# Patient Record
Sex: Female | Born: 1935
Health system: Southern US, Community
[De-identification: ages and names within clinical notes are randomized; demographics above are authoritative.]

## PROBLEM LIST (undated history)

## (undated) DIAGNOSIS — T884XXA Failed or difficult intubation, initial encounter: Secondary | ICD-10-CM

## (undated) DIAGNOSIS — B449 Aspergillosis, unspecified: Secondary | ICD-10-CM

## (undated) DIAGNOSIS — J329 Chronic sinusitis, unspecified: Secondary | ICD-10-CM

## (undated) DIAGNOSIS — J189 Pneumonia, unspecified organism: Secondary | ICD-10-CM

## (undated) DIAGNOSIS — G629 Polyneuropathy, unspecified: Secondary | ICD-10-CM

## (undated) DIAGNOSIS — C801 Malignant (primary) neoplasm, unspecified: Secondary | ICD-10-CM

## (undated) DIAGNOSIS — R32 Unspecified urinary incontinence: Secondary | ICD-10-CM

## (undated) DIAGNOSIS — D689 Coagulation defect, unspecified: Secondary | ICD-10-CM

## (undated) DIAGNOSIS — M25512 Pain in left shoulder: Secondary | ICD-10-CM

## (undated) DIAGNOSIS — M1990 Unspecified osteoarthritis, unspecified site: Secondary | ICD-10-CM

## (undated) DIAGNOSIS — Z889 Allergy status to unspecified drugs, medicaments and biological substances status: Secondary | ICD-10-CM

## (undated) DIAGNOSIS — M3130 Wegener's granulomatosis without renal involvement: Secondary | ICD-10-CM

## (undated) DIAGNOSIS — H9192 Unspecified hearing loss, left ear: Secondary | ICD-10-CM

## (undated) DIAGNOSIS — H5440 Blindness, one eye, unspecified eye: Secondary | ICD-10-CM

## (undated) DIAGNOSIS — J982 Interstitial emphysema: Secondary | ICD-10-CM

## (undated) DIAGNOSIS — IMO0001 Reserved for inherently not codable concepts without codable children: Secondary | ICD-10-CM

## (undated) DIAGNOSIS — J9601 Acute respiratory failure with hypoxia: Secondary | ICD-10-CM

## (undated) DIAGNOSIS — R5383 Other fatigue: Secondary | ICD-10-CM

## (undated) DIAGNOSIS — D649 Anemia, unspecified: Secondary | ICD-10-CM

## (undated) DIAGNOSIS — I639 Cerebral infarction, unspecified: Secondary | ICD-10-CM

## (undated) DIAGNOSIS — Z86711 Personal history of pulmonary embolism: Secondary | ICD-10-CM

## (undated) HISTORY — PX: TUBAL LIGATION: SHX77

## (undated) HISTORY — DX: Coagulation defect, unspecified: D68.9

## (undated) HISTORY — PX: TEAR DUCT PROBING: SHX793

## (undated) HISTORY — PX: PUBOVAGINAL SLING: SHX1035

## (undated) HISTORY — DX: Pain in left shoulder: M25.512

## (undated) HISTORY — DX: Aspergillosis, unspecified: B44.9

## (undated) HISTORY — PX: CATARACT EXTRACTION: SUR2

## (undated) HISTORY — DX: Interstitial emphysema: J98.2

## (undated) HISTORY — PX: OTHER SURGICAL HISTORY: SHX169

## (undated) HISTORY — DX: Acute respiratory failure with hypoxia: J96.01

## (undated) HISTORY — DX: Other fatigue: R53.83

## (undated) HISTORY — PX: MELANOMA EXCISION: SHX5266

---

## 1992-06-23 ENCOUNTER — Encounter: Payer: Self-pay | Admitting: Pulmonary Disease

## 1995-07-04 DIAGNOSIS — Z86711 Personal history of pulmonary embolism: Secondary | ICD-10-CM

## 1995-07-04 HISTORY — DX: Personal history of pulmonary embolism: Z86.711

## 1995-07-04 HISTORY — PX: VENA CAVA FILTER PLACEMENT: SUR1032

## 1996-10-27 ENCOUNTER — Encounter: Payer: Self-pay | Admitting: Pulmonary Disease

## 1998-01-08 ENCOUNTER — Encounter: Payer: Self-pay | Admitting: Pulmonary Disease

## 1998-01-13 ENCOUNTER — Ambulatory Visit: Admission: RE | Admit: 1998-01-13 | Discharge: 1998-01-13 | Payer: Self-pay | Admitting: Pulmonary Disease

## 1998-01-15 ENCOUNTER — Ambulatory Visit (HOSPITAL_BASED_OUTPATIENT_CLINIC_OR_DEPARTMENT_OTHER): Admission: RE | Admit: 1998-01-15 | Discharge: 1998-01-15 | Payer: Self-pay | Admitting: Otolaryngology

## 1998-06-08 ENCOUNTER — Ambulatory Visit: Admission: RE | Admit: 1998-06-08 | Discharge: 1998-06-08 | Payer: Self-pay

## 1999-03-04 ENCOUNTER — Other Ambulatory Visit: Admission: RE | Admit: 1999-03-04 | Discharge: 1999-03-04 | Payer: Self-pay | Admitting: Family Medicine

## 1999-05-06 ENCOUNTER — Encounter: Payer: Self-pay | Admitting: Family Medicine

## 1999-05-06 ENCOUNTER — Encounter: Admission: RE | Admit: 1999-05-06 | Discharge: 1999-05-06 | Payer: Self-pay | Admitting: Family Medicine

## 1999-06-20 ENCOUNTER — Ambulatory Visit: Admission: RE | Admit: 1999-06-20 | Discharge: 1999-06-20 | Payer: Self-pay | Admitting: Unknown Physician Specialty

## 2000-05-10 ENCOUNTER — Encounter: Admission: RE | Admit: 2000-05-10 | Discharge: 2000-05-10 | Payer: Self-pay

## 2000-07-17 ENCOUNTER — Encounter: Admission: RE | Admit: 2000-07-17 | Discharge: 2000-09-17 | Payer: Self-pay | Admitting: Orthopedic Surgery

## 2000-08-14 ENCOUNTER — Encounter: Payer: Self-pay | Admitting: Pulmonary Disease

## 2000-08-17 ENCOUNTER — Ambulatory Visit: Admission: RE | Admit: 2000-08-17 | Discharge: 2000-08-17 | Payer: Self-pay | Admitting: Pulmonary Disease

## 2001-05-14 ENCOUNTER — Encounter: Admission: RE | Admit: 2001-05-14 | Discharge: 2001-05-14 | Payer: Self-pay | Admitting: Family Medicine

## 2001-05-14 ENCOUNTER — Encounter: Payer: Self-pay | Admitting: Family Medicine

## 2001-06-05 ENCOUNTER — Ambulatory Visit (HOSPITAL_COMMUNITY): Admission: RE | Admit: 2001-06-05 | Discharge: 2001-06-05 | Payer: Self-pay | Admitting: Gastroenterology

## 2002-06-17 ENCOUNTER — Encounter: Admission: RE | Admit: 2002-06-17 | Discharge: 2002-06-17 | Payer: Self-pay | Admitting: Family Medicine

## 2002-06-17 ENCOUNTER — Encounter: Payer: Self-pay | Admitting: Family Medicine

## 2002-11-18 ENCOUNTER — Encounter (HOSPITAL_BASED_OUTPATIENT_CLINIC_OR_DEPARTMENT_OTHER): Admission: RE | Admit: 2002-11-18 | Discharge: 2003-02-16 | Payer: Self-pay | Admitting: Internal Medicine

## 2003-02-12 ENCOUNTER — Encounter: Payer: Self-pay | Admitting: Pulmonary Disease

## 2003-06-11 ENCOUNTER — Encounter (HOSPITAL_BASED_OUTPATIENT_CLINIC_OR_DEPARTMENT_OTHER): Admission: RE | Admit: 2003-06-11 | Discharge: 2003-09-09 | Payer: Self-pay | Admitting: Internal Medicine

## 2003-07-14 ENCOUNTER — Encounter: Admission: RE | Admit: 2003-07-14 | Discharge: 2003-07-14 | Payer: Self-pay | Admitting: Family Medicine

## 2003-09-08 ENCOUNTER — Other Ambulatory Visit: Admission: RE | Admit: 2003-09-08 | Discharge: 2003-09-08 | Payer: Self-pay | Admitting: Family Medicine

## 2003-11-13 ENCOUNTER — Emergency Department (HOSPITAL_COMMUNITY): Admission: EM | Admit: 2003-11-13 | Discharge: 2003-11-14 | Payer: Self-pay | Admitting: Emergency Medicine

## 2004-05-17 ENCOUNTER — Encounter (HOSPITAL_BASED_OUTPATIENT_CLINIC_OR_DEPARTMENT_OTHER): Admission: RE | Admit: 2004-05-17 | Discharge: 2004-08-08 | Payer: Self-pay | Admitting: Internal Medicine

## 2004-07-03 HISTORY — PX: KNEE ARTHROSCOPY: SHX127

## 2004-08-22 ENCOUNTER — Encounter (HOSPITAL_BASED_OUTPATIENT_CLINIC_OR_DEPARTMENT_OTHER): Admission: RE | Admit: 2004-08-22 | Discharge: 2004-09-30 | Payer: Self-pay | Admitting: Internal Medicine

## 2004-09-13 ENCOUNTER — Ambulatory Visit (HOSPITAL_COMMUNITY): Admission: RE | Admit: 2004-09-13 | Discharge: 2004-09-13 | Payer: Self-pay | Admitting: Family Medicine

## 2004-09-27 ENCOUNTER — Ambulatory Visit: Payer: Self-pay | Admitting: Pulmonary Disease

## 2004-09-30 ENCOUNTER — Ambulatory Visit (HOSPITAL_COMMUNITY): Admission: RE | Admit: 2004-09-30 | Discharge: 2004-09-30 | Payer: Self-pay | Admitting: Orthopedic Surgery

## 2004-09-30 ENCOUNTER — Ambulatory Visit (HOSPITAL_BASED_OUTPATIENT_CLINIC_OR_DEPARTMENT_OTHER): Admission: RE | Admit: 2004-09-30 | Discharge: 2004-09-30 | Payer: Self-pay | Admitting: Orthopedic Surgery

## 2004-10-07 ENCOUNTER — Emergency Department (HOSPITAL_COMMUNITY): Admission: EM | Admit: 2004-10-07 | Discharge: 2004-10-08 | Payer: Self-pay | Admitting: Emergency Medicine

## 2004-10-11 ENCOUNTER — Ambulatory Visit: Payer: Self-pay | Admitting: Pulmonary Disease

## 2004-10-26 ENCOUNTER — Ambulatory Visit: Payer: Self-pay | Admitting: Pulmonary Disease

## 2004-11-01 ENCOUNTER — Encounter (HOSPITAL_BASED_OUTPATIENT_CLINIC_OR_DEPARTMENT_OTHER): Admission: RE | Admit: 2004-11-01 | Discharge: 2005-01-30 | Payer: Self-pay | Admitting: Surgery

## 2004-11-09 ENCOUNTER — Ambulatory Visit: Payer: Self-pay | Admitting: Pulmonary Disease

## 2004-11-15 ENCOUNTER — Ambulatory Visit: Payer: Self-pay | Admitting: Cardiology

## 2004-12-07 ENCOUNTER — Ambulatory Visit: Payer: Self-pay | Admitting: Pulmonary Disease

## 2004-12-09 ENCOUNTER — Ambulatory Visit: Payer: Self-pay | Admitting: Cardiology

## 2005-01-13 ENCOUNTER — Encounter (INDEPENDENT_AMBULATORY_CARE_PROVIDER_SITE_OTHER): Payer: Self-pay | Admitting: *Deleted

## 2005-01-13 ENCOUNTER — Ambulatory Visit (HOSPITAL_BASED_OUTPATIENT_CLINIC_OR_DEPARTMENT_OTHER): Admission: RE | Admit: 2005-01-13 | Discharge: 2005-01-13 | Payer: Self-pay | Admitting: Otolaryngology

## 2005-01-13 ENCOUNTER — Ambulatory Visit (HOSPITAL_COMMUNITY): Admission: RE | Admit: 2005-01-13 | Discharge: 2005-01-13 | Payer: Self-pay | Admitting: Otolaryngology

## 2005-09-19 ENCOUNTER — Ambulatory Visit (HOSPITAL_COMMUNITY): Admission: RE | Admit: 2005-09-19 | Discharge: 2005-09-19 | Payer: Self-pay | Admitting: Family Medicine

## 2005-10-04 ENCOUNTER — Encounter: Admission: RE | Admit: 2005-10-04 | Discharge: 2005-10-04 | Payer: Self-pay | Admitting: Family Medicine

## 2005-12-08 ENCOUNTER — Encounter (HOSPITAL_BASED_OUTPATIENT_CLINIC_OR_DEPARTMENT_OTHER): Admission: RE | Admit: 2005-12-08 | Discharge: 2005-12-14 | Payer: Self-pay | Admitting: Surgery

## 2005-12-25 ENCOUNTER — Encounter (HOSPITAL_BASED_OUTPATIENT_CLINIC_OR_DEPARTMENT_OTHER): Admission: RE | Admit: 2005-12-25 | Discharge: 2006-01-31 | Payer: Self-pay | Admitting: Surgery

## 2006-03-13 ENCOUNTER — Ambulatory Visit: Payer: Self-pay | Admitting: Pulmonary Disease

## 2006-09-21 ENCOUNTER — Ambulatory Visit (HOSPITAL_COMMUNITY): Admission: RE | Admit: 2006-09-21 | Discharge: 2006-09-21 | Payer: Self-pay | Admitting: Family Medicine

## 2007-02-13 ENCOUNTER — Ambulatory Visit: Payer: Self-pay | Admitting: Pulmonary Disease

## 2007-05-15 ENCOUNTER — Ambulatory Visit: Payer: Self-pay | Admitting: Pulmonary Disease

## 2007-05-15 LAB — CONVERTED CEMR LAB: Neutrophil cytoplasmic antibodies,IgG,serum: <1:20 {titer}

## 2007-05-20 ENCOUNTER — Encounter: Payer: Self-pay | Admitting: Pulmonary Disease

## 2007-05-20 ENCOUNTER — Telehealth: Payer: Self-pay | Admitting: Pulmonary Disease

## 2007-05-21 DIAGNOSIS — J329 Chronic sinusitis, unspecified: Secondary | ICD-10-CM

## 2007-05-21 DIAGNOSIS — R05 Cough: Secondary | ICD-10-CM

## 2007-05-22 DIAGNOSIS — J45909 Unspecified asthma, uncomplicated: Secondary | ICD-10-CM

## 2007-05-22 DIAGNOSIS — M3130 Wegener's granulomatosis without renal involvement: Secondary | ICD-10-CM | POA: Insufficient documentation

## 2007-05-24 ENCOUNTER — Telehealth: Payer: Self-pay | Admitting: Pulmonary Disease

## 2007-07-31 ENCOUNTER — Ambulatory Visit: Payer: Self-pay | Admitting: Pulmonary Disease

## 2007-08-02 ENCOUNTER — Ambulatory Visit: Payer: Self-pay | Admitting: Cardiology

## 2007-10-16 ENCOUNTER — Ambulatory Visit (HOSPITAL_COMMUNITY): Admission: RE | Admit: 2007-10-16 | Discharge: 2007-10-16 | Payer: Self-pay | Admitting: Family Medicine

## 2008-01-31 ENCOUNTER — Other Ambulatory Visit: Admission: RE | Admit: 2008-01-31 | Discharge: 2008-01-31 | Payer: Self-pay | Admitting: Family Medicine

## 2008-04-22 ENCOUNTER — Ambulatory Visit: Payer: Self-pay | Admitting: Pulmonary Disease

## 2008-05-08 ENCOUNTER — Encounter: Payer: Self-pay | Admitting: Pulmonary Disease

## 2008-05-08 ENCOUNTER — Encounter: Admission: RE | Admit: 2008-05-08 | Discharge: 2008-05-08 | Payer: Self-pay | Admitting: Unknown Physician Specialty

## 2008-07-08 ENCOUNTER — Encounter: Payer: Self-pay | Admitting: Pulmonary Disease

## 2008-07-10 ENCOUNTER — Ambulatory Visit: Payer: Self-pay | Admitting: Pulmonary Disease

## 2008-07-10 DIAGNOSIS — J209 Acute bronchitis, unspecified: Secondary | ICD-10-CM

## 2008-07-15 ENCOUNTER — Ambulatory Visit: Payer: Self-pay | Admitting: Cardiology

## 2008-07-30 ENCOUNTER — Ambulatory Visit: Payer: Self-pay | Admitting: Pulmonary Disease

## 2008-08-05 ENCOUNTER — Encounter: Payer: Self-pay | Admitting: Pulmonary Disease

## 2008-08-18 ENCOUNTER — Telehealth (INDEPENDENT_AMBULATORY_CARE_PROVIDER_SITE_OTHER): Payer: Self-pay

## 2008-08-19 ENCOUNTER — Telehealth: Payer: Self-pay | Admitting: Pulmonary Disease

## 2008-10-27 ENCOUNTER — Encounter: Admission: RE | Admit: 2008-10-27 | Discharge: 2008-10-27 | Payer: Self-pay | Admitting: Rheumatology

## 2008-11-02 ENCOUNTER — Ambulatory Visit: Payer: Self-pay | Admitting: Pulmonary Disease

## 2008-11-02 DIAGNOSIS — J159 Unspecified bacterial pneumonia: Secondary | ICD-10-CM | POA: Insufficient documentation

## 2008-11-09 ENCOUNTER — Ambulatory Visit (HOSPITAL_COMMUNITY): Admission: RE | Admit: 2008-11-09 | Discharge: 2008-11-09 | Payer: Self-pay | Admitting: Family Medicine

## 2008-12-09 ENCOUNTER — Ambulatory Visit: Payer: Self-pay | Admitting: Hematology & Oncology

## 2008-12-10 LAB — CBC WITH DIFFERENTIAL (CANCER CENTER ONLY)
BASO#: 0 10*3/uL (ref 0.0–0.2)
Eosinophils Absolute: 0.2 10*3/uL (ref 0.0–0.5)
HCT: 26.6 % — ABNORMAL LOW (ref 34.8–46.6)
HGB: 8.2 g/dL — ABNORMAL LOW (ref 11.6–15.9)
LYMPH#: 0.6 10*3/uL — ABNORMAL LOW (ref 0.9–3.3)
MCH: 22.4 pg — ABNORMAL LOW (ref 26.0–34.0)
NEUT#: 5.9 10*3/uL (ref 1.5–6.5)
NEUT%: 80.9 % — ABNORMAL HIGH (ref 39.6–80.0)
RBC: 3.63 10*6/uL — ABNORMAL LOW (ref 3.70–5.32)

## 2008-12-13 LAB — ERYTHROPOIETIN: Erythropoietin: 168 m[IU]/mL — ABNORMAL HIGH (ref 2.6–34.0)

## 2008-12-13 LAB — COMPREHENSIVE METABOLIC PANEL
AST: 10 U/L (ref 0–37)
Albumin: 3.8 g/dL (ref 3.5–5.2)
BUN: 14 mg/dL (ref 6–23)
Calcium: 9.7 mg/dL (ref 8.4–10.5)
Chloride: 108 mEq/L (ref 96–112)
Potassium: 3.9 mEq/L (ref 3.5–5.3)

## 2008-12-13 LAB — RETICULOCYTES (CHCC): RBC.: 3.62 MIL/uL — ABNORMAL LOW (ref 3.87–5.11)

## 2008-12-13 LAB — VITAMIN D 25 HYDROXY (VIT D DEFICIENCY, FRACTURES): Vit D, 25-Hydroxy: 44 ng/mL (ref 30–89)

## 2009-01-20 ENCOUNTER — Ambulatory Visit: Payer: Self-pay | Admitting: Hematology & Oncology

## 2009-01-22 LAB — CBC WITH DIFFERENTIAL (CANCER CENTER ONLY)
BASO#: 0 10*3/uL (ref 0.0–0.2)
Eosinophils Absolute: 0.1 10*3/uL (ref 0.0–0.5)
HGB: 12 g/dL (ref 11.6–15.9)
LYMPH%: 13.2 % — ABNORMAL LOW (ref 14.0–48.0)
MCH: 29.5 pg (ref 26.0–34.0)
MCHC: 32.1 g/dL (ref 32.0–36.0)
MCV: 92 fL (ref 81–101)
MONO%: 9.6 % (ref 0.0–13.0)
NEUT%: 73.9 % (ref 39.6–80.0)
RBC: 4.07 10*6/uL (ref 3.70–5.32)

## 2009-01-22 LAB — CHCC SATELLITE - SMEAR

## 2009-01-25 LAB — RETICULOCYTES (CHCC)
ABS Retic: 44.6 10*3/uL (ref 19.0–186.0)
RBC.: 4.05 MIL/uL (ref 3.87–5.11)
Retic Ct Pct: 1.1 % (ref 0.4–3.1)

## 2009-01-25 LAB — FERRITIN: Ferritin: 133 ng/mL (ref 10–291)

## 2009-02-01 ENCOUNTER — Ambulatory Visit: Payer: Self-pay | Admitting: Pulmonary Disease

## 2009-03-26 ENCOUNTER — Encounter (INDEPENDENT_AMBULATORY_CARE_PROVIDER_SITE_OTHER): Payer: Self-pay | Admitting: General Surgery

## 2009-03-26 ENCOUNTER — Ambulatory Visit (HOSPITAL_BASED_OUTPATIENT_CLINIC_OR_DEPARTMENT_OTHER): Admission: RE | Admit: 2009-03-26 | Discharge: 2009-03-26 | Payer: Self-pay | Admitting: General Surgery

## 2009-04-05 ENCOUNTER — Telehealth: Payer: Self-pay | Admitting: Pulmonary Disease

## 2009-04-21 ENCOUNTER — Ambulatory Visit: Payer: Self-pay | Admitting: Hematology & Oncology

## 2009-04-23 LAB — CBC WITH DIFFERENTIAL (CANCER CENTER ONLY)
BASO#: 0 10*3/uL (ref 0.0–0.2)
BASO%: 0.6 % (ref 0.0–2.0)
EOS%: 2.8 % (ref 0.0–7.0)
HCT: 38.4 % (ref 34.8–46.6)
HGB: 12.9 g/dL (ref 11.6–15.9)
LYMPH#: 0.6 10*3/uL — ABNORMAL LOW (ref 0.9–3.3)
MCHC: 33.5 g/dL (ref 32.0–36.0)
MONO#: 0.6 10*3/uL (ref 0.1–0.9)
NEUT#: 3.6 10*3/uL (ref 1.5–6.5)
WBC: 4.9 10*3/uL (ref 3.9–10.0)

## 2009-04-23 LAB — RETICULOCYTES (CHCC): RBC.: 3.97 MIL/uL (ref 3.87–5.11)

## 2009-05-18 ENCOUNTER — Encounter: Payer: Self-pay | Admitting: Pulmonary Disease

## 2009-07-15 ENCOUNTER — Ambulatory Visit: Payer: Self-pay | Admitting: Hematology & Oncology

## 2009-07-16 LAB — CBC WITH DIFFERENTIAL (CANCER CENTER ONLY)
BASO#: 0 10*3/uL (ref 0.0–0.2)
Eosinophils Absolute: 0.1 10*3/uL (ref 0.0–0.5)
HGB: 11.4 g/dL — ABNORMAL LOW (ref 11.6–15.9)
LYMPH#: 0.6 10*3/uL — ABNORMAL LOW (ref 0.9–3.3)
MONO#: 0.7 10*3/uL (ref 0.1–0.9)
NEUT#: 4.1 10*3/uL (ref 1.5–6.5)
Platelets: 365 10*3/uL (ref 145–400)
RBC: 3.69 10*6/uL — ABNORMAL LOW (ref 3.70–5.32)
WBC: 5.5 10*3/uL (ref 3.9–10.0)

## 2009-07-16 LAB — FERRITIN: Ferritin: 24 ng/mL (ref 10–291)

## 2009-08-02 ENCOUNTER — Ambulatory Visit: Payer: Self-pay | Admitting: Pulmonary Disease

## 2009-08-16 ENCOUNTER — Encounter: Payer: Self-pay | Admitting: Pulmonary Disease

## 2009-10-05 ENCOUNTER — Ambulatory Visit: Payer: Self-pay | Admitting: Hematology & Oncology

## 2009-10-07 LAB — CBC WITH DIFFERENTIAL (CANCER CENTER ONLY)
BASO%: 0.5 % (ref 0.0–2.0)
EOS%: 1.9 % (ref 0.0–7.0)
HCT: 40.2 % (ref 34.8–46.6)
HGB: 13.1 g/dL (ref 11.6–15.9)
LYMPH#: 0.6 10*3/uL — ABNORMAL LOW (ref 0.9–3.3)
MCHC: 32.5 g/dL (ref 32.0–36.0)
MONO#: 0.7 10*3/uL (ref 0.1–0.9)
NEUT#: 4.7 10*3/uL (ref 1.5–6.5)
RDW: 13.7 % (ref 10.5–14.6)
WBC: 6.2 10*3/uL (ref 3.9–10.0)

## 2009-10-07 LAB — FERRITIN: Ferritin: 61 ng/mL (ref 10–291)

## 2009-10-07 LAB — RETICULOCYTES (CHCC): ABS Retic: 55.7 10*3/uL (ref 19.0–186.0)

## 2009-10-07 LAB — CHCC SATELLITE - SMEAR

## 2009-11-15 ENCOUNTER — Ambulatory Visit (HOSPITAL_COMMUNITY): Admission: RE | Admit: 2009-11-15 | Discharge: 2009-11-15 | Payer: Self-pay | Admitting: Hematology & Oncology

## 2010-01-10 ENCOUNTER — Ambulatory Visit: Payer: Self-pay | Admitting: Hematology & Oncology

## 2010-01-12 LAB — CBC WITH DIFFERENTIAL (CANCER CENTER ONLY)
BASO%: 0.4 % (ref 0.0–2.0)
Eosinophils Absolute: 0.2 10*3/uL (ref 0.0–0.5)
HCT: 39.2 % (ref 34.8–46.6)
LYMPH#: 0.7 10*3/uL — ABNORMAL LOW (ref 0.9–3.3)
LYMPH%: 15.2 % (ref 14.0–48.0)
MCV: 100 fL (ref 81–101)
MONO#: 0.6 10*3/uL (ref 0.1–0.9)
NEUT%: 68.2 % (ref 39.6–80.0)
RBC: 3.93 10*6/uL (ref 3.70–5.32)
RDW: 12.7 % (ref 10.5–14.6)
WBC: 4.6 10*3/uL (ref 3.9–10.0)

## 2010-01-12 LAB — RETICULOCYTES (CHCC)
ABS Retic: 62.9 10*3/uL (ref 19.0–186.0)
Retic Ct Pct: 1.6 % (ref 0.4–3.1)

## 2010-01-12 LAB — FERRITIN: Ferritin: 46 ng/mL (ref 10–291)

## 2010-07-13 ENCOUNTER — Ambulatory Visit: Payer: Self-pay | Admitting: Hematology & Oncology

## 2010-07-14 LAB — CBC WITH DIFFERENTIAL (CANCER CENTER ONLY)
BASO#: 0 10*3/uL (ref 0.0–0.2)
BASO%: 0.6 % (ref 0.0–2.0)
EOS%: 1.9 % (ref 0.0–7.0)
Eosinophils Absolute: 0.1 10*3/uL (ref 0.0–0.5)
HCT: 39 % (ref 34.8–46.6)
HGB: 13 g/dL (ref 11.6–15.9)
LYMPH#: 0.6 10*3/uL — ABNORMAL LOW (ref 0.9–3.3)
LYMPH%: 10.3 % — ABNORMAL LOW (ref 14.0–48.0)
MCH: 33.1 pg (ref 26.0–34.0)
MCHC: 33.5 g/dL (ref 32.0–36.0)
MCV: 99 fL (ref 81–101)
MONO#: 0.9 10*3/uL (ref 0.1–0.9)
MONO%: 14.4 % — ABNORMAL HIGH (ref 0.0–13.0)
NEUT#: 4.3 10*3/uL (ref 1.5–6.5)
NEUT%: 72.8 % (ref 39.6–80.0)
Platelets: 280 10*3/uL (ref 145–400)
RBC: 3.94 10*6/uL (ref 3.70–5.32)
RDW: 13.2 % (ref 10.5–14.6)
WBC: 5.9 10*3/uL (ref 3.9–10.0)

## 2010-07-14 LAB — FERRITIN: Ferritin: 33 ng/mL (ref 10–291)

## 2010-07-14 LAB — RETICULOCYTES (CHCC)
ABS Retic: 51.9 10*3/uL (ref 19.0–186.0)
RBC.: 3.99 MIL/uL (ref 3.87–5.11)
Retic Ct Pct: 1.3 % (ref 0.4–3.1)

## 2010-07-22 ENCOUNTER — Encounter
Admission: RE | Admit: 2010-07-22 | Discharge: 2010-07-22 | Payer: Self-pay | Source: Home / Self Care | Attending: Emergency Medicine | Admitting: Emergency Medicine

## 2010-07-24 ENCOUNTER — Encounter: Payer: Self-pay | Admitting: Family Medicine

## 2010-08-04 NOTE — Letter (Signed)
Summary: [facility]  [facility]   Imported By: Lanelle Bal 09/27/2009 14:20:02  _____________________________________________________________________  External Attachment:    Type:   Image     Comment:   External Document

## 2010-08-04 NOTE — Letter (Signed)
Summary: [facility]  [facility]   Imported By: Sherian Rein 08/18/2009 13:19:44  _____________________________________________________________________  External Attachment:    Type:   Image     Comment:   External Document

## 2010-08-04 NOTE — Assessment & Plan Note (Signed)
Summary: rov for asthma, Wegeners   Copy to:  [facility] Azzie Roup Primary Provider/Referring Provider:  Donovan Kail [facility])  CC:  Pt is here for a 6 month f/u appt.  Pt states she went to [facility] over Thanksgiving and was seen by a Pulmonologist there.  Pt states she had a bronch done and had "air passages dilated."  Pt states breathing has improved.  Pt is currently not on Prednisone.  Pt states occ cough with small amounts of yellow sputum.   Marland Kitchen  History of Present Illness: The pt comes in today for f/u of her multiple pulmonary issues.  She has known  Wegener's which is being followed and treated by rheum, with her last chest ct showing signficant improvement in her cavitary densities.  She also has a h/o bronchial stenosis involving a few secondary/tertiary airways, and also has known airflow obstruction that I believe is at least partially due to asthma.  She tells me that she was recently referred to [facility] where she had what sounds like balloon dilatation of her stenotic areas, but did not have stents placed.  She feels that her breathing is much improved since the procedure last Nov, and currently has minimal cough with scant mucus.  I had initially had her on foradil and not an ICS because she was on chronic oral prednisone.  I had told her to start qvar once she came off prednisone in addition to the foradil.  She apparently was started on advair at [facility], and although will be good therapy, I am worried about upper airway irritation and cough in this patient who has had a chronic cough.  Current Medications (verified): 1)  Calcium 600 Mg  Tabs (Calcium) .... Take 1 Tablet By Mouth Once A Day 2)  Coumadin .... Use As Directed By Coumadin Clinic 3)  Glucosamine Chondroitin .... Take By Mouth Once Daily 4)  Tylenol Pm .... Take 1 Tab By Mouth At Bedtime  and As Needed 5)  Melatonin .... Take 1 Tab By Mouth At Bedtime 6)  Proventil Hfa 108 (90 Base) Mcg/act  Aers  (Albuterol Sulfate) .... 2 Puffs Every 4-6 Hours As Needed 7)  Timoptic ? % Soln (Timolol Maleate) .Marland Kitchen.. 1 Drop in Each Eye Once Daily 8)  Furosemide 20 Mg Tabs (Furosemide) .... As Needed 9)  Klor-Con M20 20 Meq Cr-Tabs (Potassium Chloride Crys Cr) .... As Needed 10)  Folic Acid 1 Mg Tabs (Folic Acid) .... Take 1 Tablet By Mouth Once A Day 11)  Methotrexate .... (Unsure of Dosage) Take 4 Tabs By Mouth Once A Week 12)  Travatan Z 0.004 % Soln (Travoprost) .Marland Kitchen.. 1 Drop in Each Eye At Bedtime 13)  Advair Diskus 500-50 Mcg/dose Aepb (Fluticasone-Salmeterol) .... Inhale 1 Puff Two Times A Day  Allergies (verified): No Known Drug Allergies  Review of Systems      See HPI  Vital Signs:  Patient profile:   77 year old female Height:      62 inches Weight:      139.50 pounds BMI:     25.61 O2 Sat:      96 % on Room air Temp:     97.9 degrees F oral Pulse rate:   86 / minute BP sitting:   130 / 70  (left arm) Cuff size:   regular  Vitals Entered By: Arman Filter LPN (August 02, 2009 10:08 AM)  O2 Flow:  Room air CC: Pt is here for a 6 month  f/u appt.  Pt states she went to [facility] over Thanksgiving and was seen by a Pulmonologist there.  Pt states she had a bronch done and had "air passages dilated."  Pt states breathing has improved.  Pt is currently not on Prednisone.  Pt states occ cough with small amounts of yellow sputum.    Comments Medications reviewed with patient Arman Filter LPN  August 02, 2009 10:10 AM    Physical Exam  General:  wd female in nad Nose:  no drainage noted. Lungs:  large airway wheeze in upper lung zone, + upper airway pseudowheezing as well. Heart:  rrr Extremities:  no cyanosis or edema Neurologic:  alert and oriented, moves all 4.   Impression & Recommendations:  Problem # 1:  WEGENERS GRANULOMATOSIS (ICD-446.4)  the pt feels that she is doing better following bronch with balloon dilatation of her airway stenosis.  It surprises me she has seen  that big of a difference, and has maintained this without stenting (typically balloon dilatation is very short lived unless stent is place for maintenance).  She is maintaining on mtx under the direction of rheum.    Problem # 2:  INTRINSIC ASTHMA, UNSPECIFIED (ICD-493.10) the pt has had no acute bronchospasm on her current regimen.  I have asked her to continue, but if she has a chronic dry cough that persists,  she will need to come off advair.  I assume that she is going to get ongoing pulmonary care at [facility], and there is no reason for duplicate visits.  I will see her again in 6mos, but if she is having ongoing f/u with [facility], there is no reason for her to see me as well.  Medications Added to Medication List This Visit: 1)  Timoptic ? % Soln (timolol Maleate)  .Marland Kitchen.. 1 drop in each eye once daily 2)  Advair Diskus 500-50 Mcg/dose Aepb (Fluticasone-salmeterol) .... Inhale 1 puff two times a day  Other Orders: Est. Patient Level III (62952)  Patient Instructions: 1)  no change in treatment. 2)  followup with me in 6mos.

## 2010-09-01 ENCOUNTER — Other Ambulatory Visit: Payer: Self-pay | Admitting: Dermatology

## 2010-11-16 ENCOUNTER — Other Ambulatory Visit (HOSPITAL_COMMUNITY): Payer: Self-pay | Admitting: Family Medicine

## 2010-11-16 DIAGNOSIS — Z1231 Encounter for screening mammogram for malignant neoplasm of breast: Secondary | ICD-10-CM

## 2010-11-18 NOTE — Op Note (Signed)
[name], MARIO                ACCOUNT NO.:  [id number]   MEDICAL RECORD NO.:  [id number]          PATIENT TYPE:  AMB   LOCATION:  DSC                          FACILITY:  [facility]   PHYSICIAN:  Christopher E. Ezzard Standing, M.D.DATE OF BIRTH:  03-Sep-1932   DATE OF PROCEDURE:  01/13/2005  DATE OF DISCHARGE:                                 OPERATIVE REPORT   PREOPERATIVE DIAGNOSIS:  History of Wegener's granulomatosis, with right-  sided sinus disease, questionable active disease.   POSTOPERATIVE DIAGNOSIS:  History of Wegener's granulomatosis, with right-  sided sinus disease, questionable active disease.   OPERATION:  Nasal endoscopy, with biopsy of right ethmoid, right maxillary  sinus region, to rule out Wegener's granulomatosis.   SURGEON:  Kristine Garbe. Ezzard Standing, M.D.   ANESTHESIA:  General endotracheal.   COMPLICATIONS:  None.   BRIEF CLINICAL NOTE:  [name] is a 77 year old female with the  diagnosis of Wegener's granulomatosis.  She has been treated and has been in  remission, however more recently has had some increased activity, and  question whether she is having increased activity of her Wegener's.  On CT  scan, she had some disease within the right ethmoid, right maxillary sinus,  and Dr. Phylliss Bob requested some biopsy of tissue from this area to rule out  active Wegener's granulomatosis.   DESCRIPTION OF PROCEDURE:  After adequate endotracheal anesthesia, the nose  was prepped with cotton pledgets soaked in Afrin for decongestion.  She had  a large amount of crusting in both nasal cavities which were cleaned in the  office.  The mucosa otherwise looked relatively good after cleaning the  crusting from the nose.  Several biopsies were obtained from the right  ethmoid region and also from the right maxillary ostia area.  Of note, the  mucosa within the maxillary sinus on the right side was very thickened, but  no real ulceration.  It appeared just thickened polypoid  edematous type  mucosa.  Biopsies were obtained from the right maxillary ostia region as  well as the right ethmoid area.  The nose was irrigated with saline.  Cotton  pledgets soaked in Afrin were used for hemostasis, along with suction  cautery.  This completed the procedure.  A small piece of Gelfoam was placed  in the right middle meatus area after obtaining the biopsy.  [name] was  awakened from anesthesia and transferred to the recovery room  postoperatively doing well.   DISPOSITION:  [name] is discharged home later this morning on Tylenol p.r.n.  pain.  I will have her follow up in my office in one week for recheck and  review biopsy.        CEN/MEDQ  D:  01/13/2005  T:  01/13/2005  Job:  [id number]   cc:   Areatha Keas, M.D.  [address]  [address]  [place]  [place] [address]  Fax: [phone]   Kristine Garbe. Ezzard Standing, M.D.  [address]  [place] [address]  Fax: [phone]

## 2010-11-18 NOTE — Assessment & Plan Note (Signed)
[facility]   NAMEDEB, LOUDIN                ACCOUNT NO.:  [id number]   MEDICAL RECORD NO.:  [id number]      DATE OF BIRTH:  1933/02/28   PHYSICIAN:  Jonelle Sports. Sevier, M.D.  VISIT DATE:  01/04/2006                                     OFFICE VISIT   VITAL SIGNS:  Examination today:  Blood pressure 110/60, pulse 58 regular,  respirations 18 and temperature 97.6.   PURPOSE OF TODAY'S VISIT:  HISTORY:  This 77 year old has for a venous  ulceration on the medial supramalleolar aspect of her left lower extremity.  She was last seen June 25 and at that time was placed in an Unna wrap.   WOUND EXAM:  The wound on the left lower extremity in the medial  supramalleolar area now measures 0.8 x 0.8 x 0.05 and has no active  drainage.  There does appear to be a small amount of slough over the surface  of the wound.   WOUND SINCE LAST VISIT:  She reports no problems since that time.  Mainly,  no excessive drainage, no odor, no pain, no fever and no systemic symptoms.   CHANGE IN INTERVAL MEDICAL HISTORY:  She has had no change in medication.   DIAGNOSIS:  Impression is satisfactory course of a venous ulcer, left lower  extremity.   TREATMENT:  The wound is treated with an application of Silverlon and that  extremity is then placed back in an Unna wrap.   TISSUE DEBRIDED:  DISPOSITION:  The wound is full thickness debrided to  remove this slough and a bit of fibrinous material from the center of the  wound as well.  It looks quite healthy and is well granulated.   COMPRESSION BANDAGE:  The patient does have compression stockings at home  available for use and I have suggested to her that although she prefers not  to use them in the summer, and does not use them on the contralateral  extremity, that they are regularly used for at least 6 weeks following  healing of this lesion is strongly recommended.   MANAGEMENT PLAN & GOAL:  Followup visit will be here in 7-8  days.  It is  anticipated the wound will likely be healed by then.           ______________________________  Jonelle Sports. Cheryll Cockayne, M.D.     RES/MEDQ  D:  01/04/2006  T:  01/04/2006  Job:  [id number]

## 2010-11-18 NOTE — Consult Note (Signed)
[facility]  Patient:    [name], [name]                         MRN: [id number] Proc. Date: 07/17/00 Dictator:   Arsenio Loader, M.D. CC:         [name]. Tenny Craw, M.D.   Consultation Report  REFERRING PHYSICIAN:  Charles A. Tenny Craw, M.D. - [facility]  HISTORY OF PRESENT ILLNESS:  The patient is a 77 year old white female with a history of Wagoners granulomatosis with pulmonary and renal upper respiratory tract infections and DD:  07/17/00 TD:  07/17/00 Job: [id number]

## 2010-11-18 NOTE — Consult Note (Signed)
NAME:  [name], [name]                          ACCOUNT NO.:  [id number]   MEDICAL RECORD NO.:  [id number]                   PATIENT TYPE:  REC   LOCATION:  FOOT                                 FACILITY:  [facility]   PHYSICIAN:  Jonelle Sports. Cheryll Cockayne, M.D.              DATE OF BIRTH:  [birth year]   DATE OF CONSULTATION:  11/20/2002  DATE OF DISCHARGE:                                   CONSULTATION   REFERRING PHYSICIAN:  Leonides Grills, M.D.   HISTORY OF PRESENT ILLNESS:  This 77 year old white female is seen on  referral by Dr. Lestine Box for foot care and consideration of custom inserts.   The patient has a complex medical history centered primarily around [facility]  granulomatosis and its related complications to include peripheral  neuropathy.   The neuropathy has resulted in considerable deformity of her feet and also  an insensitivity in the forefoot.  She has tended to calluses in the past,  but has had no active foot ulcerations.  She does have a tendency towards  vasculitic ulcer on the lower extremities, which she usually manages with  antibiotic cream and dry dressings and is able to achieve healing, albeit  over an extended period of time.   In the past, the patient has been evaluated and treated at the [facility] with steroids, Cytoxan, and other regimens, but  currently is on no chronic treatment for her disease.   She is here today for our consideration for ongoing foot care and protective  footwear.   PAST MEDICAL HISTORY:  In addition to the peripheral neuropathy, her  Wegner's has been complicated by bronchial stenosis and emphysema, deep  venous thrombosis with pulmonary embolism, difficulty with hearing,  unilateral blindness, chronic rhinosinusitis, and the lower extremity  ulcers.   ALLERGIES:  She has medicinal allergies.   MEDICATIONS:  Her regular medications include:  1. Trimethoprim sulfa double strength one-half tablet daily.  2. Coumadin on  schedule.  3. Advair Diskus.  4. Rhinocort.  5. Vitamin E.  6. Calcium.  7. Multivitamins.  8. Glucosamine.  9. Tylenol.  10.      Vitamin B complex.  11.      Ranitidine.  12.      Topical estrogen.  13.      Advair Diskus.  14.      Rhinocort nasal spray.  15.      Cefuroxime at times.   PHYSICAL EXAMINATION:  The examination today is limited to the distal lower  extremities.  The patient's feet are characterized by some deformity with  considerable clawing of the toes, particularly of the third, fourth, and  fifth toes bilaterally with the fourth toe tending to extend under the third  on the left foot.  She has no growth edema.  Her skin temperatures are  essentially normal and symmetrical.  Monofilament testing shows that she  lacks protective  sensation in the toes and portions of the forefoot.  Her  pulses are diminished, but palpable.  Capillary filling is reasonable.  She  has multiple scattered calluses on the plantar aspects of her feet, none  with ulceration.  She has numerous hyperpigmented areas on her lower legs  and feet that correspond with areas of healed ulceration.  She has a single  open ulceration measuring 5 x 2 mm, shallow, and dry on her medial right  lower extremity.  She also has a small healing blister on the dorsal aspect  of her first MP joint on the right.   DISPOSITION:  1. The patient is given general instruction regarding foot care by video     with nurse and position reinforcement.  2. Calluses underlying the fifth metatarsal heads bilaterally.  The tip of     the fourth toe and the plantar aspects of the right hallux are all     sharply pared without incident.  3. [name] is held with the patient regarding the desirability of     custom inserts to try to prevent ulceration of these calloused areas     and/or the advance to more significant foot problems.  She is in     agreement and accordingly is given a prescription for custom molded      inserts to be obtained through [employer].  4. Followup visit will be here in several months for routine nail care.                                               Jonelle Sports. Cheryll Cockayne, M.D.    RES/MEDQ  D:  11/20/2002  T:  11/20/2002  Job:  [id number]   cc:   C. Duane Lope, M.D.  [address]  [place]  [place] [address]  Fax: [phone]   Leonides Grills, M.D.  [address]  [place]  [place] [address]  Fax: [phone]

## 2010-11-18 NOTE — Assessment & Plan Note (Signed)
[facility]   NAMEAZIZA, STUCKERT                ACCOUNT NO.:  [id number]   MEDICAL RECORD NO.:  [id number]      DATE OF BIRTH:  1932-09-29   PHYSICIAN:  Theresia Majors. Tanda Rockers, M.D. VISIT DATE:  01/24/2006                                     OFFICE VISIT   VITAL SIGNS:  Her vital signs are stable, she is afebrile.  Blood pressure  is 130/81, respirations 20, pulse rate 70, and she is afebrile.   PURPOSE OF TODAY'S VISIT:  Ms. Axelson returns for followup of left lower  extremity stasis ulceration.   WOUND EXAM:  Inspection of the left lower extremity shows that the wound is  completely resolved.  The chronic changes of stasis are persistent including  2+ edema.   WOUND SINCE LAST VISIT:  In the interim she was treated with compression  hose and a focal piece of felt to provide compression. She reports there has  been an absence of drainage, malodor, pain, or fever.   DIAGNOSIS:  Resolved stasis ulcer.   MANAGEMENT PLAN & GOAL:  We are discharging the patient with a prescription  to procure and wear bilateral below-the-knee 30-40 mm compression hose.  We  have advised her that these hose will have to be inspected and replaced at 3-  month intervals.  This could very well be accomplished by her primary care  physician.  However, if she needs to be reevaluated by the [facility], she  may request her primary care physician to re-refer her to the Wound Center  for reevaluation.  The patient seems to understand.  She expresses gratitude  for having been seen in the clinic and indicates that she will be compliant  with the aforementioned instructions.           ______________________________  Theresia Majors Tanda Rockers, M.D.     [name]  D:  01/25/2006  T:  01/25/2006  Job:  [id number]

## 2010-11-18 NOTE — Consult Note (Signed)
[facility]  Patient:    [name], [name]                       MRN: [id number] Proc. Date: 07/17/00 Adm. Date:  [id number] Attending:  Nadara Mustard CC:         [name]. Tenny Craw, M.D.   Consultation Report  REFERRING PHYSICIAN:  Charles A. Tenny Craw, M.D. - [facility]  HISTORY OF PRESENT ILLNESS:  The patient is a 77 year old white female with a history of Wegeners granulomatosis with pulmonary upper respiratory tract infections and mononeuritis multiplex.  In addition, she has peripheral vascular disease and chronic lower extremity edema.  She presents today complaining of a right lower extremity ulcer that has been persistent x 9 months.  The ulcer was initially traumatic when the patient banged into the refrigerator door.  She has been using Neosporin with Tegaderm dressings, but the ulcer continues to gradually enlarge.  She has baseline lower extremity edema.  She occasionally wears compression hose and takes Lasix p.r.n.  When she does both of these on a regular basis, she notices the lower extremity edema is almost completely resolved.  PAST MEDICAL HISTORY:  Wegeners granulomatosis with upper respiratory tract pulmonary and renal involvement as well as mononeuritis multiplex, a history of bilateral lower extremity DVTs with pulmonary embolus in 1996, peripheral vascular disease, urinary incontinence and osteoarthritis, left eye blindness.  PAST SURGICAL HISTORY:  IVC filter.  ALLERGIES:  Contact dermatitis secondary to TAPE.  MEDICATIONS: 1. Coumadin 2.5 mg q.d. except Fridays, when she takes 5 mg. 2. Zaroxolyn 2.5 mg p.o. q.d. 3. Lasix 80 mg p.o. p.r.n. 4. Bactrim, one tablet p.o. b.i.d. 5. Zantac 150 mg p.o. q.d. 6. Prempro 0.625/2.5 mg p.o. q.d. 7. Timoptic HC 0.5 mg, one drop each eye q.d. 8. Advair multivitamins. 9. Tylenol p.r.n.  PHYSICAL EXAMINATION:  EXTREMITIES:  She has a small, 11 x 12 mm ulceration on the right  lower extremity.  It is shallow based with clean margins.  No periwound erythema or induration.  She has trace to 1+ pitting edema on bilateral lower extremities. Her feet are without ulcerations.  She does have multiple calluses on the lateral aspect of both feet and the left fifth small toe.  DP pulses were trace bilaterally.  ASSESSMENT: 1. Right lower extremity traumatic ulceration. 2. Chronic venous stasis. 3. Peripheral vascular disease. 4. Wegeners granulomatosis.  PLAN: 1. Lasix 80 mg/Zaroxolyn 2.5 mg p.o. q.a.m. - should be on scheduled daily    diuretics. 2. Compression hose to be worn every day. 3. Nonstick dressing with Neosporin to ulceration. 4. Discontinue water aerobics until ulceration has completely healed. 5. Recommend obtaining shoes with wider toe box.  She discussed this with our    ortho tech.  He was able to provide her with recommendations on a good    brand and places to be fitted for more appropriate shoes. 6. Return to the clinic in two weeks.  If the ulceration has not improved    significantly, would recommend ______ dressing at that time. DD:  07/17/00 TD:  07/17/00 Job: [id number]

## 2010-11-18 NOTE — Op Note (Signed)
[name], [name]                ACCOUNT NO.:  [id number]   MEDICAL RECORD NO.:  [id number]          PATIENT TYPE:  AMB   LOCATION:  NESC                         FACILITY:  [facility]   PHYSICIAN:  Georges Lynch. Gioffre, M.D.DATE OF BIRTH:  1932-10-21   DATE OF PROCEDURE:  09/30/2004  DATE OF DISCHARGE:                                 OPERATIVE REPORT   SURGEON:  Georges Lynch. Darrelyn Hillock, M.D.   ASSISTANT:  Nurse.   PREOPERATIVE DIAGNOSIS:  Degenerative arthritis of the right knee.   POSTOPERATIVE DIAGNOSIS:  Degenerative arthritis of the right knee.   OPERATION/PROCEDURE:  1.  Diagnostic arthroscopy, right knee.  2.  Medial meniscectomy, right knee.  3.  Lateral meniscectomy, right knee.  4.  Abrasion and chondroplasty, medial femoral condyle, right knee.   DESCRIPTION OF PROCEDURE:  Under general anesthesia, routine orthopedic  prepping and draping of the right lower extremity is carried out.  She had 1  g of IV Ancef preoperatively.  A small punctate incision was made in the  suprapatellar pouch.  Inflow cannula was inserted.  Knee was distended with  saline.  Other small punctate incisions were made in the anterolateral  joint.  The arthroscope was entered from the lateral approach.  I did a  complete diagnostic arthroscopy.  She had a rather severe arthritis in her  knee.  I did a partial medial meniscectomy with a shaver suction device.  Went over the lateral joint.  Did a partial lateral meniscectomy.  The  lateral meniscus was much worse in regards to the tear of the meniscus.  I  also did abrasive chondroplasty of the medial femoral condyle.  Note she had  tricompartmental arthritis of the suprapatellar pouch and arthritic changes  as well.  Thoroughly irrigated out the knee, removed all the fluid. Closed  all three punctate incisions with a 3-0 nylon suture.  I injected 20 mL of  0.5% Marcaine and epinephrine into the knee joints.  Sterile Neosporin  ____________ dressing were  applied.   FOLLOW UP:  1.  She will be seen in 12-14 days in the office.  2.  She will on a walker and crutches for weightbearing.  3.  She will start back on her Coumadin today.  4.  She will remove all the dressings Saturday at noon and apply a Band-aid.      She may shower then.  5.  I will see her back before two weeks if any further problems.     RAG/MEDQ  D:  09/30/2004  T:  09/30/2004  Job:  [id number]

## 2010-11-18 NOTE — Procedures (Signed)
[place]  Patient:    [name], [name] Visit Number: [id number] MRN: [id number]          Service Type: END Location: ENDO Attending Physician:  Louie Bun Dictated by:   Everardo All Madilyn Fireman, M.D. Proc. Date: 06/05/01 Admit Date:  06/05/2001   CC:         Helene Kelp, M.D.   Procedure Report  PROCEDURE:  Colonoscopy.  INDICATION FOR PROCEDURE:  Screening colonoscopy in a 77 year old patient with no previous colon screening.  DESCRIPTION OF PROCEDURE:  The patient was placed in the left lateral decubitus position and placed on the pulse monitor with continuous low-flow oxygen delivered by nasal cannula.  She was sedated with 40 mg IV Demerol and 7.5 mg IV Versed.  The Olympus video colonoscope was inserted into the rectum and advanced to the cecum, confirmed by transillumination of McBurneys point and visualization of the ileocecal valve and appendiceal orifice.  The prep was excellent.  The cecum, ascending, transverse, and descending colon all appeared normal with no masses, polyps, diverticula, or other mucosal abnormalities.  In the sigmoid colon there were seen a few scattered diverticula and no other abnormalities.  The rectum appeared normal, and a retroflex view of the anus revealed no obviously enlarged internal hemorrhoids.  The colonoscope was then withdrawn and the patient returned to the recovery room in stable condition.  She tolerated the procedure well, and there were no apparent complications.  IMPRESSION:  Left-sided diverticulosis, otherwise normal colonoscopy.  PLAN:  Next colon screening by Hemoccult and flexible sigmoidoscopy in five years. Dictated by:   Everardo All Madilyn Fireman, M.D. Attending Physician:  Louie Bun DD:  06/05/01 TD:  06/05/01 Job: [id number]

## 2010-11-18 NOTE — Assessment & Plan Note (Signed)
[facility]   [name], [name]                ACCOUNT NO.:  [id number]   MEDICAL RECORD NO.:  [id number]      DATE OF BIRTH:  05/19/1933   PHYSICIAN:  Theresia Majors. Tanda Rockers, M.D. VISIT DATE:  01/12/2006                                     OFFICE VISIT   SUBJECTIVE:  Ms. [name] returns for follow-up of a stasis ulcer on the medial  aspect of her left lower extremity.  During the interim, she has worn a  compression wrap.  She reports that there has been decrease in the swelling  and there has been scant drainage.  There has been no malodor and no fever.   OBJECTIVE:  Her vital signs are stable.  She is afebrile.  Inspection of the  lower extremity shows that the chronic changes of stasis are persistent with  2+ edema, hyperpigmentation and some stasis dermatitis.  The ulcer itself  had a halo of nonviable tissue and some frank liquefaction necrosis in the  center. A full thickness was performed with hemorrhage controlled with  direct pressure.  The patient was instructed in the use of a felt pad for  direct pressure and utilizing an Ace compression wrap to provide a complete  compression of the lower extremity.  She has requested a modification so  that she would be able to take a shower and we have agreed to try this  modification to accommodate her.   ASSESSMENT:  Improved stasis ulcer.   PLAN:  Will follow the patient up in two weeks as needed.           ______________________________  Theresia Majors Tanda Rockers, M.D.     [name]  D:  01/12/2006  T:  01/12/2006  Job:  [id number]

## 2010-11-18 NOTE — Assessment & Plan Note (Signed)
[facility]   NAME:  [name], [name]                ACCOUNT NO.:  [id number]   MEDICAL RECORD NO.:  [id number]           DATE OF BIRTH:   PHYSICIAN:  Theresia Majors. Tanda Rockers, M.D. VISIT DATE:  12/25/2005                                     OFFICE VISIT   SUBJECTIVE:  [name] returns for follow-up of a stasis ulcer of her left  lower extremity.  In the interim she has worn an Unna wrap.  She has had an  excursion to the beach.  She did not get the dressing saturated but it did  get moist.  She continued to wear the dressing.  She denies extreme soilage,  fever, or pain.   OBJECTIVE:  Her vital signs are stable.  The wrap has been removed.  There  has been definite healing.  There is near 100% re-epithelialization.  There  is a persistence of 3+ edema.  We have re-applied the [employer].  We will  reevaluate the patient in one week.           ______________________________  Theresia Majors. Tanda Rockers, M.D.     [name]  D:  12/25/2005  T:  12/25/2005  Job:  [id number]

## 2010-11-18 NOTE — Assessment & Plan Note (Signed)
[facility]   [name], [name]                ACCOUNT NO.:  [id number]   MEDICAL RECORD NO.:  [id number]      DATE OF BIRTH:  [birth year]   PHYSICIAN:  Theresia Majors. Tanda Rockers, M.D. VISIT DATE:  12/11/2005                                     OFFICE VISIT   VITAL SIGNS:  Her vital signs are stable, she is afebrile.   PURPOSE OF TODAY'S VISIT:  Ms. [name] returns to the clinic for evaluation of  an ulceration on the left medial lower extremity. According to the patient,  she was entering her upright freezer and a frozen package fell onto her leg  resulting in a skin break and minor bleeding. She self treated herself with  initial first aid and over-the-counter ointment. The wound itself has  remained red, it has enlarged slightly and has become encrusted with a  brownish covering. She denies fever, she does complain of some itching and  moderate pain on ambulation.   WOUND EXAM:  Inspection of the left lower extremity shows that there is 2-3+  edema with chronic changes of stasis including hyperpigmentation and broken  spider angiomata. The pedal pulses are faintly palpable, the ulcer itself is  a macular wound in a circular configuration with a diameter of approximately  1.2 cm. There is a serous drainage but no malodor and a halo of erythema.   WOUND SINCE LAST VISIT:  Not given.   CHANGE IN INTERVAL MEDICAL HISTORY:  Not given.   DIAGNOSIS:  Stasis ulcer.   TREATMENT:  Not given.   ANESTHETIC USED:  Not given.   TISSUE DEBRIDED:  Not given.   LEVEL:  Not given.   CHANGE IN MEDS:  Not given.   COMPRESSION BANDAGE:  Not given.   OTHER:  Not given.   MANAGEMENT PLAN & GOAL:  We would place the patient in an Unna wrap. The  patient is scheduled to go to the beach for two weeks. We have counseled her  regarding keeping the wound dry and the anticipation of some drainage. The  patient has been specifically counseled to avoid prolonged dependency. If  the  wrap gets too tight, she  is to get off of her legs and elevate her legs above the heart. After 15  minutes, if there is no relief, she is to completely remove the wrap and  apply a dressing and an antibiotic ointment and make an appointment to be  seen in the Wound Center upon her return. The patient seems to understand  and will be dubious.           ______________________________  Theresia Majors Tanda Rockers, M.D.     [name]  D:  12/11/2005  T:  12/11/2005  Job:  [id number]

## 2010-11-25 ENCOUNTER — Ambulatory Visit (HOSPITAL_COMMUNITY)
Admission: RE | Admit: 2010-11-25 | Discharge: 2010-11-25 | Disposition: A | Discharge status: Home / Self Care | Payer: Medicare Other | Source: Ambulatory Visit | Attending: Family Medicine | Admitting: Family Medicine

## 2010-11-25 DIAGNOSIS — Z1231 Encounter for screening mammogram for malignant neoplasm of breast: Secondary | ICD-10-CM | POA: Insufficient documentation

## 2010-12-29 ENCOUNTER — Other Ambulatory Visit: Payer: Self-pay | Admitting: Obstetrics and Gynecology

## 2010-12-29 DIAGNOSIS — R918 Other nonspecific abnormal finding of lung field: Secondary | ICD-10-CM

## 2011-01-02 ENCOUNTER — Other Ambulatory Visit: Payer: Self-pay | Admitting: Family Medicine

## 2011-01-02 DIAGNOSIS — R918 Other nonspecific abnormal finding of lung field: Secondary | ICD-10-CM

## 2011-01-03 ENCOUNTER — Ambulatory Visit
Admission: RE | Admit: 2011-01-03 | Discharge: 2011-01-03 | Disposition: A | Discharge status: Home / Self Care | Payer: Medicare Other | Source: Ambulatory Visit | Attending: Obstetrics and Gynecology | Admitting: Obstetrics and Gynecology

## 2011-01-03 DIAGNOSIS — R918 Other nonspecific abnormal finding of lung field: Secondary | ICD-10-CM

## 2011-02-07 ENCOUNTER — Inpatient Hospital Stay (HOSPITAL_COMMUNITY)
Admission: EM | Admit: 2011-02-07 | Discharge: 2011-02-10 | DRG: 378 | Disposition: A | Discharge status: Home / Self Care | Payer: Medicare Other | Attending: Internal Medicine | Admitting: Internal Medicine

## 2011-02-07 ENCOUNTER — Emergency Department (HOSPITAL_COMMUNITY): Payer: Medicare Other

## 2011-02-07 ENCOUNTER — Encounter (HOSPITAL_COMMUNITY): Payer: Self-pay | Admitting: Radiology

## 2011-02-07 DIAGNOSIS — F329 Major depressive disorder, single episode, unspecified: Secondary | ICD-10-CM | POA: Diagnosis present

## 2011-02-07 DIAGNOSIS — B449 Aspergillosis, unspecified: Secondary | ICD-10-CM | POA: Diagnosis present

## 2011-02-07 DIAGNOSIS — K625 Hemorrhage of anus and rectum: Principal | ICD-10-CM | POA: Diagnosis present

## 2011-02-07 DIAGNOSIS — R748 Abnormal levels of other serum enzymes: Secondary | ICD-10-CM | POA: Diagnosis present

## 2011-02-07 DIAGNOSIS — D62 Acute posthemorrhagic anemia: Secondary | ICD-10-CM | POA: Diagnosis present

## 2011-02-07 DIAGNOSIS — H9190 Unspecified hearing loss, unspecified ear: Secondary | ICD-10-CM | POA: Diagnosis present

## 2011-02-07 DIAGNOSIS — K648 Other hemorrhoids: Secondary | ICD-10-CM | POA: Diagnosis present

## 2011-02-07 DIAGNOSIS — F3289 Other specified depressive episodes: Secondary | ICD-10-CM | POA: Diagnosis present

## 2011-02-07 DIAGNOSIS — Z86711 Personal history of pulmonary embolism: Secondary | ICD-10-CM

## 2011-02-07 DIAGNOSIS — T45515A Adverse effect of anticoagulants, initial encounter: Secondary | ICD-10-CM | POA: Diagnosis present

## 2011-02-07 DIAGNOSIS — Z86718 Personal history of other venous thrombosis and embolism: Secondary | ICD-10-CM

## 2011-02-07 DIAGNOSIS — K5730 Diverticulosis of large intestine without perforation or abscess without bleeding: Secondary | ICD-10-CM | POA: Diagnosis present

## 2011-02-07 DIAGNOSIS — M3130 Wegener's granulomatosis without renal involvement: Secondary | ICD-10-CM | POA: Diagnosis present

## 2011-02-07 LAB — COMPREHENSIVE METABOLIC PANEL
BUN: 28 mg/dL — ABNORMAL HIGH (ref 6–23)
CO2: 20 mEq/L (ref 19–32)
Calcium: 8.5 mg/dL (ref 8.4–10.5)
Creatinine, Ser: 0.8 mg/dL (ref 0.50–1.10)
GFR calc Af Amer: >60 mL/min (ref >60)
GFR calc non Af Amer: >60 mL/min (ref >60)
Glucose, Bld: 99 mg/dL (ref 70–99)

## 2011-02-07 LAB — HM COLONOSCOPY

## 2011-02-07 LAB — CBC
HCT: 17.5 % — ABNORMAL LOW (ref 36.0–46.0)
Hemoglobin: 5.7 g/dL — CL (ref 12.0–15.0)
MCH: 31.1 pg (ref 26.0–34.0)
MCHC: 32.6 g/dL (ref 30.0–36.0)
RDW: 16.4 % — ABNORMAL HIGH (ref 11.5–15.5)

## 2011-02-07 LAB — DIFFERENTIAL
Basophils Relative: 1 % (ref 0–1)
Eosinophils Relative: 1 % (ref 0–5)
Lymphocytes Relative: 10 % — ABNORMAL LOW (ref 12–46)
Monocytes Absolute: 0.7 10*3/uL (ref 0.1–1.0)
Monocytes Relative: 10 % (ref 3–12)
Neutro Abs: 5.2 10*3/uL (ref 1.7–7.7)

## 2011-02-07 LAB — URINALYSIS, ROUTINE W REFLEX MICROSCOPIC
Glucose, UA: NEGATIVE mg/dL
Specific Gravity, Urine: 1.022 (ref 1.005–1.030)
pH: 5 (ref 5.0–8.0)

## 2011-02-07 LAB — PROTIME-INR
INR: 1.09 (ref 0.00–1.49)
Prothrombin Time: 14.3 seconds (ref 11.6–15.2)

## 2011-02-07 LAB — POCT I-STAT TROPONIN I: Troponin i, poc: 0.02 ng/mL (ref 0.00–0.08)

## 2011-02-07 LAB — CK TOTAL AND CKMB (NOT AT ARMC)
CK, MB: 2.1 ng/mL (ref 0.3–4.0)
Total CK: 34 U/L (ref 7–177)

## 2011-02-07 LAB — APTT: aPTT: 119 seconds — ABNORMAL HIGH (ref 24–37)

## 2011-02-07 LAB — LIPASE, BLOOD: Lipase: 56 U/L (ref 11–59)

## 2011-02-07 LAB — URINE MICROSCOPIC-ADD ON

## 2011-02-07 LAB — PREPARE RBC (CROSSMATCH)

## 2011-02-07 MED ORDER — IOHEXOL 300 MG/ML  SOLN
100.0000 mL | Freq: Once | INTRAMUSCULAR | Status: AC | PRN
  Administered 2011-02-07: 100 mL via INTRAVENOUS

## 2011-02-07 NOTE — H&P (Signed)
NAMEGERRIE, [name]                ACCOUNT NO.:  [id number]  MEDICAL RECORD NO.:  [id number]  LOCATION:  [facility]LED                         FACILITY:  [facility]  PHYSICIAN:  Talmage Nap, MD  DATE OF BIRTH:  1932/08/05  DATE OF ADMISSION:  02/07/2011 DATE OF DISCHARGE:                             HISTORY & PHYSICAL   PRIMARY CARE PHYSICIAN:  [facility].  History obtainable from the patient and the patient's spouse.  CHIEF COMPLAINT:  Painless rectal bleed for 2 days with dizziness.  HISTORY OF PRESENT ILLNESS:  The patient is a 77 year old Caucasian female with history of DVT/PE, status post IVC filter, Coumadin, and also Wegener granulomatosis disease, presenting to the emergency room with 2 days history of rectal bleed that is painless.  The patient claimed that 2 days prior to presenting to the emergency room, she went to the bathroom and she saw some tinge of blood in her stool.  She denied any tenesmus.  She denied any straining during defecation. However today, she noted that she was passing pure bloody stool.  There was no associated abdominal discomfort.  There is no history of tenesmus.  No chest pain.  No shortness of breath.  The patient claims she was feeling progressively dizzy and lightheaded.  She denied any PND or orthopnea.  She also denied any systemic symptoms.  No fever, chills, or rigor.  The painless rectal bleed with progressive dyspnea was said to be getting progressively worse.  Hence the patient presented to the emergency room to be evaluated.  PAST MEDICAL HISTORY:  Positive for: 1. Wegener granulomatosis disease. 2. History of DVT. 3. Pulmonary embolism. 4. Arthritis. 5. Anemia. 6. Cataract. 7. Depression. 8. Aspergillosis. 9. Visual loss. 10.Chronic sinusitis. 11.Hearing impairment.  PAST SURGICAL HISTORY: 1. Cataract surgery x2. 2. Bronchoscopy x5. 3. DVT/PE, status post IVC filter. 4. Right knee arthroscopic  surgery. 5. Stress incontinence, status post vagina sling complicated by urge     incontinence. 6. Tubal ligation.  PREADMISSION MEDICATIONS: 1. Neti rinse 99% bicarbonate mixture over-the-counter 1 spray daily. 2. Albuterol inhaler 2 puffs q.4 h. p.r.n. 3. Voriconazole 100 mg 1 p.o. b.i.d. 4. Coumadin (warfarin 2.5 mg 1 to 1.5 tablets, she is taking everyday,     but on Tuesday and Saturday, 1 tablet that is 2.5 mg). 5. Timoptic (timolol ophthalmic solution 0.5% in both eyes 1 drop     q.a.m. 6. Multivitamins 1 p.o. daily. 7. Methotrexate 2.5 mg 4 tablets p.o. every Thursday. 8. Melatonin over-the-counter 1 p.o. daily at bedtime. 9. Latanoprost 0.005% ophthalmic 1 drop in both eyes daily at bedtime. 10.Glucosamine HCl 1 tablet p.o. daily. 11.Folic acid 1 mg p.o. daily. 12.Advair Diskus (fluticasone and salmeterol) 500/50 mcg 1 puff b.i.d. 13.Cymbalta (duloxetine) 60 mg 1 p.o. q.a.m. 14.Acetaminophen/diphenhydramine over-the-counter 1 p.o. daily at     bedtime. 15.Vitamin D3, 2000 units p.o. daily, 1 tablet. 16.Calcium/vitamin D over-the-counter 1 tablet p.o. daily.  ALLERGIES:  She has no known allergies.  SOCIAL HISTORY:  Negative for alcohol or tobacco use.  The patient is currently retired, lives at home with her spouse.  FAMILY HISTORY:  York Spaniel to be positive for autoimmune disease.  Daughter  has some Graves disease.  REVIEW OF SYSTEMS:  The patient denies any history of headaches.  No blurred vision.  Complained about lightheadedness and dyspneic spell. Denies any fever.  No chills, no rigor.  No chest pain or shortness of breath.  No cough.  No abdominal discomfort.  Painless hematochezia with no associated abdominal pain.  Denies any dysuria or hematuria.  No intolerance to heat or cold.  No swelling of the lower extremities and no neuropsychiatric disorder.  PHYSICAL EXAMINATION:  GENERAL:  A very pleasant elderly lady, dehydrated, not in any obvious respiratory  distress at present. VITAL SIGNS:  Blood pressure is 100/55, pulse 74, respiratory rate 20, and temperature is 98.5. HEENT:  Pallor. NECK:  No jugular venous distention.  No carotid bruit.  No lymphadenopathy. CHEST:  Clear to auscultation. HEART:  Heart sounds are 1 and 2. ABDOMEN:  Soft and nontender.  Liver, spleen, and kidney not palpable. Bowel sounds are positive. EXTREMITIES:  No pedal edema. NEUROLOGIC:  Nonfocal. MUSCULOSKELETAL SYSTEM:  Show arthritic changes in the knees and in the feet. NEUROPSYCHIATRIC:  Evaluation is unremarkable. SKIN:  Showed decreased turgor.  LABORATORY DATA:  Fecal occult blood test positive.  Coagulation profile showed PT 85, INR greater than 10.01, and APTT 119.  All are elevated. First set of cardiac markers, troponin-I 0.02.  Lipase is 56.  Lactic acid level is 2.0.  Urine microscopy showed urine epithelial cells are rare, urine rbc's 7 to 10 and bacteria is rare and urine microscopy unremarkable.  Chemistry shows sodium of 136, potassium of 3.7, chloride of 108 with a bicarbonate of 20, glucose is 99, BUN is 28, and creatinine 0.80.  LFTs showed total protein 5.2, albumin is 2.8, AST 90, ALT 46, and alkaline phosphatase is 144.  Hematological indices showed WBC count of 6.6, hemoglobin of 5.7, hematocrit of 17.5, MCV of 95.6 with a platelet count of 260, and neutrophils 79% and absolute neutrophil count is 5.2.  Imaging studies done on the patient include acute abdominal series, unremarkable.  CT of the abdomen and pelvis with contrast showed no findings to explain the patient's intermittent diarrhea and tarry stool. Appendix is identified and marked as normal.  Similar appearance of pulmonary nodule, which may be related to the patient's Wegener granulomatosis versus neoplastic process.  EKG showed normal sinus rhythm with a rate of 63.  No acute ST-wave change noted.  ADMITTING IMPRESSION: 1. Painless rectal bleed. 2.  Coagulopathy. 3. Anemia. 4. Dehydration. 5. History of deep venous thrombosis/pulmonary embolism, status post     IVC filter, on Coumadin. 6. Wegener granulomatosis disease. 7. History of aspergillosis. 8. Depression. 9. Cataract with visual impairment. 10.Chronic sinusitis. 11.Hearing impairment. 12.Abnormal LFT.  PLAN:  Plan is to admit the patient to Telemetry.  The patient will be slowly rehydrated with normal saline IV to go at rate of 65 cc an hour. She will be typed and crossed, transfused with 3 units of packed RBCs. She will be given Lasix 40 mg IV after second unit.  She will also be typed and crossed and transfused with 2 units of FFP and she will be given vitamin K 10 mg subcutaneously stat.  Other medication to be given to the patient will include Protonix 40 mg IV q.12 h. for GI prophylaxis and TED stockings for DVT prophylaxis.  The patient will also be on calcium 1 tablet p.o. daily and vitamin D3, 2000 units p.o. b.i.d.  She also be on folic acid 1 mg p.o. daily.  She will be restarted on ophthalmic medication and this include latanoprost as well as timolol maleate.  Further workup to be done on this patient will include repeating CBC, CMP, and magnesium in a.m.  PT, PTT, and INR will be repeated in a.m.  The patient will be followed and evaluated on day-to- day basis.     Talmage Nap, MD     CN/MEDQ  D:  02/07/2011  T:  02/07/2011  Job:  [id number]  Electronically Signed by Talmage Nap  on 02/07/2011 07:38:30 PM

## 2011-02-08 LAB — COMPREHENSIVE METABOLIC PANEL
AST: 44 U/L — ABNORMAL HIGH (ref 0–37)
CO2: 23 mEq/L (ref 19–32)
Calcium: 9.4 mg/dL (ref 8.4–10.5)
Chloride: 111 mEq/L (ref 96–112)
Creatinine, Ser: 0.76 mg/dL (ref 0.50–1.10)
GFR calc Af Amer: >60 mL/min (ref >60)
GFR calc non Af Amer: >60 mL/min (ref >60)
Glucose, Bld: 137 mg/dL — ABNORMAL HIGH (ref 70–99)
Total Bilirubin: 0.4 mg/dL (ref 0.3–1.2)

## 2011-02-08 LAB — CBC
HCT: 27 % — ABNORMAL LOW (ref 36.0–46.0)
Hemoglobin: 9.4 g/dL — ABNORMAL LOW (ref 12.0–15.0)
MCH: 31.6 pg (ref 26.0–34.0)
MCHC: 34.8 g/dL (ref 30.0–36.0)
MCV: 90.9 fL (ref 78.0–100.0)
RDW: 16.3 % — ABNORMAL HIGH (ref 11.5–15.5)

## 2011-02-08 LAB — DIFFERENTIAL
Basophils Absolute: 0 10*3/uL (ref 0.0–0.1)
Lymphs Abs: 0.6 10*3/uL — ABNORMAL LOW (ref 0.7–4.0)
Monocytes Relative: 18 % — ABNORMAL HIGH (ref 3–12)

## 2011-02-08 LAB — URINE CULTURE: Culture: NO GROWTH

## 2011-02-08 LAB — PROTIME-INR
INR: 0.96 (ref 0.00–1.49)
INR: 0.99 (ref 0.00–1.49)

## 2011-02-09 LAB — CBC
HCT: 27.1 % — ABNORMAL LOW (ref 36.0–46.0)
Hemoglobin: 9.2 g/dL — ABNORMAL LOW (ref 12.0–15.0)
MCV: 92.8 fL (ref 78.0–100.0)
RBC: 2.92 MIL/uL — ABNORMAL LOW (ref 3.87–5.11)
WBC: 3.7 10*3/uL — ABNORMAL LOW (ref 4.0–10.5)

## 2011-02-09 LAB — COMPREHENSIVE METABOLIC PANEL
AST: 32 U/L (ref 0–37)
Albumin: 2.4 g/dL — ABNORMAL LOW (ref 3.5–5.2)
BUN: 12 mg/dL (ref 6–23)
Creatinine, Ser: 0.6 mg/dL (ref 0.50–1.10)
Potassium: 3.5 mEq/L (ref 3.5–5.1)
Total Protein: 5 g/dL — ABNORMAL LOW (ref 6.0–8.3)

## 2011-02-09 LAB — PREPARE FRESH FROZEN PLASMA: Unit division: 0

## 2011-02-09 LAB — PROTIME-INR
INR: 1 (ref 0.00–1.49)
Prothrombin Time: 13.4 seconds (ref 11.6–15.2)

## 2011-02-10 LAB — CBC
HCT: 27.8 % — ABNORMAL LOW (ref 36.0–46.0)
Hemoglobin: 9.4 g/dL — ABNORMAL LOW (ref 12.0–15.0)
MCH: 31.9 pg (ref 26.0–34.0)
MCHC: 33.8 g/dL (ref 30.0–36.0)
MCV: 94.2 fL (ref 78.0–100.0)

## 2011-02-11 LAB — TYPE AND SCREEN
Unit division: 0
Unit division: 0
Unit division: 0

## 2011-03-07 NOTE — Discharge Summary (Signed)
NAMEENDIYA, KLAHR                ACCOUNT NO.:  [id number]  MEDICAL RECORD NO.:  [id number]  LOCATION:  1402                         FACILITY:  [facility]  PHYSICIAN:  Calvert Cantor, M.D.     DATE OF BIRTH:  1933/01/11  DATE OF ADMISSION:  02/07/2011 DATE OF DISCHARGE:                        DISCHARGE SUMMARY - REFERRING   PRIMARY CARE PHYSICIAN:  Dr. Duane Lope with [facility].  RHEUMATOLOGIST:  Dr. Dareen Piano with [facility].  Coumadin is followed by [facility]  EXPECTED DATE OF DISPOSITION:  February 10, 2011  DISCHARGE DIAGNOSES: 1. Rectal bleeding in setting of supratherapeutic INR, now resolved. 2. Acute blood loss anemia related to rectal bleeding, status post 3     units of packed red blood cells this admission. 3. Coagulopathy in a setting of chronic Coumadin use. 4. History of deep venous thrombosis and pulmonary embolism, status     post IVC filter remotely. 5. History of Wegener granulomatosis disease. 6. Aspergillosis. 7. Depression. 8. Cataracts. 9. Hearing impairment.  CONSULTATIONS DURING HOSPITALIZATION:  [facility], Everardo All. Madilyn Fireman, M.D.  PROCEDURES DURING HOSPITALIZATION: 1. Acute abdominal series performed on February 07, 2011, showing no     active process with nonobstructive bowel gas pattern. 2. CT of the abdomen and pelvis obtained on February 07, 2011, with no     acute intraabdominal findings.  CT does show a stable appearance of     pulmonary nodules.  BRIEF HISTORY OF PRESENT ILLNESS:  Ms. [name] is a 77 year old female with past medical history of PE and DVT, status post IVC filter in 1996, also with history of Wegener granulomatosis and chronic Coumadin use. The patient presented to the emergency department on day of admission with complaints of rectal bleeding for approximately 2 days with associated dizziness.  The patient described bleeding as bright red in nature with no  associated pain.  She denied any shortness of breath. Again, she did report some dizziness and lightheadedness.  Upon evaluation in the emergency department, the patient found to have a hemoglobin of 5.7 in addition to an INR of greater than 10.01.  The patient was admitted at that time by [facility] hospitalist for further evaluation and treatment.  COURSE OF HOSPITALIZATION: 1. Rectal bleeding.  The patient's bleeding felt related to     coagulopathy in setting of supratherapeutic INR of greater than     10.01.  The patient's INR was reversed at the time of admission     with both vitamin K as well as FFP.  After the patient received     both vitamin K and 2 units of fresh frozen plasma. The patient's     bleeding resolved with resolution of supratherapeutic INR.  GI was     asked to see the patient in consultation.  The patient did undergo     a colonoscopy on February 10, 2011, results show internal hemorrhoids     as well as few diverticula; however, no active bleeding was noted.     The patient cleared for discharge by Gastroenterology. 2. Acute blood loss anemia related to rectal bleeding.  The patient     did receive 3  units of packed red blood cells during     hospitalization.  Her hemoglobin has gone from 5.7 and has remained     stable at 9.4 with no recurrent bleeding.  No further transfusions     felt necessary at this time. 3. Coagulopathy in setting of chronic Coumadin use.  The patient's     elevated INR is felt related to medication effect as the patient is     on methotrexate as well as antifungal medication for her     aspergillosis.  Prior to this admission, the patient was being     followed monthly in the Coumadin Clinic at [facility].  She will need closer monitoring for now of her INR.     At this time, her INRs drifted down to 1.0 and with no recurrent     bleeding.  The patient is felt stable to resume Coumadin at lower     dose and follow up  with Coumadin Clinic as listed below.  MEDICATIONS AT DISCHARGE: 1. Coumadin 2.5 mg p.o. daily unless otherwise directed by Coumadin     Clinic. 2. Tylenol PM 1 tablet p.o. q.h.s. p.r.n. insomnia. 3. Advair Diskus 500/50 mcg 1 puff inhaled b.i.d. 4. Albuterol inhaler 2 puffs inhaled q.4 h. p.r.n. shortness of     breath. 5. Calcium plus vitamin D over-the-counter tablet 1 tablet p.o. daily. 6. Cymbalta 60 mg p.o. daily. 7. Folic acid 1 mg p.o. b.i.d. 8. Glucosamine 1 tablet p.o. b.i.d. 9. Latanoprost 0.005% 1 drop both eyes q.h.s. 10.Melatonin over-the-counter p.o. q.h.s. 11.Methotrexate 2.5 mg tablet 4 tablets p.o. every Thursday. 12.Multivitamins p.o. daily. 13.Timolol ophthalmic solution 0.5% 1 drop both eyes daily. 14.Vitamin D 2000 units p.o. daily. 15.Voriconazole 200 mg p.o. b.i.d.  PERTINENT LABORATORY FINDINGS DURING HOSPITALIZATION:  Discharge CBC shows white cell count of 4.2, platelet count 235, hemoglobin 9.4, hematocrit 27.8.  Sodium 141, potassium 3.5, BUN 12, creatinine 0.60. Liver function tests within normal limits.  INR on February 09, 2011, 1.00 down from greater than 10.01 on February 07, 2011.  DISPOSITION:  The patient is felt medically stable for discharge home at this time.  Again, the patient is instructed to follow closely with Coumadin Clinic.  She is scheduled to be seen in the Coumadin Clinic at [facility] on Monday, August 13th, at 11 a.m.  In addition, the patient is asked to follow up with her primary care physician, Dr. Duane Lope in 2 weeks' time.  The patient is to continue regular diet as tolerated.  Approximately 40 minutes spent on discharge planning.     Cordelia Pen, NP   ______________________________ Calvert Cantor, M.D.    LE/MEDQ  D:  02/10/2011  T:  02/10/2011  Job:  [id number]  cc:   Magnus Sinning) Tenny Craw, M.D. Fax: [phone]  Dr. [facility]  Dr. [facility]  Electronically Signed by Cordelia Pen NP on 03/03/2011 02:47:24 PM Electronically Signed by Calvert Cantor M.D. on 03/07/2011 02:22:15 PM

## 2011-03-15 NOTE — Op Note (Signed)
  NAMERACHELL, DRUCKENMILLER                ACCOUNT NO.:  [id number]  MEDICAL RECORD NO.:  [id number]  LOCATION:  1402                         FACILITY:  [facility]  PHYSICIAN:  Graylin Shiver, M.D.   DATE OF BIRTH:  11/28/1932  DATE OF PROCEDURE:  02/10/2011 DATE OF DISCHARGE:  02/10/2011                              OPERATIVE REPORT   INDICATIONS FOR PROCEDURE:  Rectal bleeding, anemia.  Informed consent was obtained after explanation of the risks of bleeding, infection, and perforation.  PREMEDICATION:  Fentanyl 50 mcg IV, Versed 4 mg IV.  PROCEDURE:  With the patient in the left lateral decubitus position, a rectal exam was performed and no masses were felt.  The Pentax colonoscope was inserted into the rectum and advanced around the colon to the cecum.  Cecal landmarks were identified.  The cecum, ascending colon looked normal.  The transverse colon looked normal.  The descending colon and sigmoid revealed a few diverticula.  The rectum looked normal.  There were some internal hemorrhoids.  There was no evidence of active bleeding or blood seen on this examination.  She tolerated the procedure well without complications.  IMPRESSION: 1. Diverticulosis. 2. Internal hemorrhoids.  There is no evidence of active bleeding now.  I think it is reasonable to discharge the patient from a GI standpoint.          ______________________________ Graylin Shiver, M.D.     SFG/MEDQ  D:  02/10/2011  T:  02/11/2011  Job:  [id number]  Electronically Signed by Herbert Moors MD on 03/15/2011 08:52:34 AM

## 2011-03-26 NOTE — Consult Note (Signed)
  [name], [name]                ACCOUNT NO.:  [id number]  MEDICAL RECORD NO.:  [id number]  LOCATION:  1402                         FACILITY:  [facility]  PHYSICIAN:  Jordanne Elsbury C. Madilyn Fireman, M.D.    DATE OF BIRTH:  1933/05/10  DATE OF CONSULTATION: DATE OF DISCHARGE:                                CONSULTATION   REASON FOR CONSULTATION:  GI bleeding.  HISTORY OF ILLNESS:  The patient is a 77 year old white female with history of Wegener granulomatosis as well as remote DVT with pulmonary embolism, status post IVC filter many years ago, and continued on Coumadin who presented with painless hematochezia, orthostasis with a hemoglobin of 5.7 and an INR of 10.  She received 3 units of packed red blood cells as well as fresh frozen plasma with normalization of her INR and rise in hemoglobin to 9.4.  She had a last colonoscopy in 2002 showing left-sided diverticulosis.  She has noticed some maroon smear with her bowel movements today, but otherwise significant decrease in the rectal output.  PAST MEDICAL HISTORY: 1. Wegener granulomatosis. 2. History of DVT. 3. History of pulmonary emboli. 4. History of aspergillosis 5. Hearing impairment.  SURGERIES: 1. Multiple bronchoscopies. 2. IVC filter. 3. Right knee arthroscopy.  ADMISSION MEDICATIONS:  Coumadin, albuterol, methotrexate, melatonin, Advair, Cymbalta, acetaminophen.  SOCIAL HISTORY:  The patient denies alcohol or tobacco use.  PHYSICAL EXAMINATION:  GENERAL:  Well-developed, well-nourished white female in no acute stress. HEART:  Regular rate and rhythm without murmurs. LUNGS:  Clear. ABDOMEN:  Soft, nondistended with normoactive bowel sounds.  No hepatosplenomegaly, mass, or guarding.  IMPRESSION:  Lower gastrointestinal bleed, exacerbated by over- anticoagulation with Coumadin.  PLAN:  Since the patient has had adequate resuscitation, we will plan colonoscopy.  Because of her regular diet today, she will be  prepped tomorrow for colonoscopy, which will be scheduled for the day after tomorrow.          ______________________________ Everardo All Madilyn Fireman, M.D.     J[facility]/MEDQ  D:  02/08/2011  T:  02/08/2011  Job:  [id number]  Electronically Signed by Dorena Cookey M.D. on 03/26/2011 11:39:21 AM

## 2011-05-16 DIAGNOSIS — Z9889 Other specified postprocedural states: Secondary | ICD-10-CM | POA: Insufficient documentation

## 2011-05-16 DIAGNOSIS — I2699 Other pulmonary embolism without acute cor pulmonale: Secondary | ICD-10-CM | POA: Insufficient documentation

## 2011-05-16 DIAGNOSIS — N393 Stress incontinence (female) (male): Secondary | ICD-10-CM | POA: Insufficient documentation

## 2011-05-16 DIAGNOSIS — B449 Aspergillosis, unspecified: Secondary | ICD-10-CM | POA: Insufficient documentation

## 2011-05-16 DIAGNOSIS — Z8719 Personal history of other diseases of the digestive system: Secondary | ICD-10-CM | POA: Insufficient documentation

## 2011-05-16 DIAGNOSIS — Z8619 Personal history of other infectious and parasitic diseases: Secondary | ICD-10-CM | POA: Insufficient documentation

## 2011-05-16 DIAGNOSIS — G5793 Unspecified mononeuropathy of bilateral lower limbs: Secondary | ICD-10-CM | POA: Insufficient documentation

## 2011-05-16 DIAGNOSIS — M159 Polyosteoarthritis, unspecified: Secondary | ICD-10-CM | POA: Insufficient documentation

## 2011-05-16 DIAGNOSIS — H5462 Unqualified visual loss, left eye, normal vision right eye: Secondary | ICD-10-CM | POA: Insufficient documentation

## 2011-05-16 DIAGNOSIS — D039 Melanoma in situ, unspecified: Secondary | ICD-10-CM | POA: Insufficient documentation

## 2011-06-14 DIAGNOSIS — H04549 Stenosis of unspecified lacrimal canaliculi: Secondary | ICD-10-CM | POA: Insufficient documentation

## 2011-06-28 ENCOUNTER — Other Ambulatory Visit: Payer: Self-pay

## 2011-06-28 ENCOUNTER — Encounter (HOSPITAL_COMMUNITY): Payer: Self-pay | Admitting: Emergency Medicine

## 2011-06-28 ENCOUNTER — Emergency Department (HOSPITAL_COMMUNITY): Payer: Medicare Other

## 2011-06-28 ENCOUNTER — Emergency Department (HOSPITAL_COMMUNITY)
Admission: EM | Admit: 2011-06-28 | Discharge: 2011-06-28 | Disposition: A | Discharge status: Home / Self Care | Payer: Medicare Other | Attending: Emergency Medicine | Admitting: Emergency Medicine

## 2011-06-28 DIAGNOSIS — Z79899 Other long term (current) drug therapy: Secondary | ICD-10-CM | POA: Insufficient documentation

## 2011-06-28 DIAGNOSIS — M19019 Primary osteoarthritis, unspecified shoulder: Secondary | ICD-10-CM | POA: Insufficient documentation

## 2011-06-28 DIAGNOSIS — M25512 Pain in left shoulder: Secondary | ICD-10-CM

## 2011-06-28 DIAGNOSIS — M25519 Pain in unspecified shoulder: Secondary | ICD-10-CM | POA: Insufficient documentation

## 2011-06-28 HISTORY — DX: Unspecified urinary incontinence: R32

## 2011-06-28 HISTORY — DX: Blindness, one eye, unspecified eye: H54.40

## 2011-06-28 HISTORY — DX: Unspecified osteoarthritis, unspecified site: M19.90

## 2011-06-28 HISTORY — DX: Wegener's granulomatosis without renal involvement: M31.30

## 2011-06-28 HISTORY — DX: Unspecified hearing loss, left ear: H91.92

## 2011-06-28 MED ORDER — HYDROCODONE-ACETAMINOPHEN 5-325 MG PO TABS: 1.0000 | ORAL_TABLET | ORAL | Status: AC | PRN

## 2011-06-28 NOTE — ED Notes (Signed)
Patient is resting comfortably. 

## 2011-06-28 NOTE — ED Notes (Signed)
Vital signs stable. 

## 2011-06-28 NOTE — ED Notes (Signed)
ZOX:WR60<AV> Expected date:06/28/11<BR> Expected time: 5:22 AM<BR> Means of arrival:Ambulance<BR> Comments:<BR> EMS

## 2011-06-28 NOTE — ED Notes (Signed)
Family at bedside. 

## 2011-06-28 NOTE — ED Provider Notes (Signed)
History     CSN: [id number]  Arrival date & time 06/28/11  1191   First MD Initiated Contact with Patient 06/28/11 0630      Chief Complaint  Patient presents with  . Shoulder Pain    Shoulder pain.  She had fallen Sunday  and not seen.  Pt states pain tingles down left arm.    (Consider location/radiation/quality/duration/timing/severity/associated sxs/prior treatment) Patient is a 77 y.o. female presenting with shoulder pain. The history is provided by the patient.  Shoulder Pain  She did having pain in her left shoulder last night. She had fallen 2 days earlier but did not think she had injured anything. Pain in the left shoulder is dull and worse with any movement. Pain is moderately severe and she rates it 7/10. She had taken Tramdol with slight, temporary relief. His with his shoulder.  Past Medical History  Diagnosis Date  . Wegener's granulomatosis   . Fungal infection of lung   . OA (osteoarthritis)   . Blindness of one eye   . Hearing loss in left ear   . Bladder incontinence   . Glaucoma     History reviewed. No pertinent past surgical history.  No family history on file.  History  Substance Use Topics  . Smoking status: Not on file  . Smokeless tobacco: Not on file  . Alcohol Use:     OB History    Grav Para Term Preterm Abortions TAB SAB Ect Mult Living                  Review of Systems  All other systems reviewed and are negative.    Allergies  Review of patient's allergies indicates no known allergies.  Home Medications   Current Outpatient Rx  Name Route Sig Dispense Refill  . DIPHENHYDRAMINE-APAP (SLEEP) 25-500 MG PO TABS Oral Take 1 tablet by mouth at bedtime as needed.      . DULOXETINE HCL 60 MG PO CPEP Oral Take 60 mg by mouth daily.      Marland Kitchen FLUTICASONE-SALMETEROL 500-50 MCG/DOSE IN AEPB Inhalation Inhale 1 puff into the lungs every 12 (twelve) hours.      Marland Kitchen FOLIC ACID 1 MG PO TABS Oral Take 1 mg by mouth daily.      . FUROSEMIDE  80 MG PO TABS Oral Take 80 mg by mouth daily as needed. For excess fluid redux     . METHOTREXATE 2.5 MG PO TABS Oral Take 2.5 mg by mouth 4 (four) times a week. Caution:Chemotherapy. Protect from light.     Marland Kitchen POTASSIUM CHLORIDE CRYS CR 20 MEQ PO TBCR Oral Take 20 mEq by mouth as needed. With lasix     . VORICONAZOLE 200 MG PO TABS Oral Take 200 mg by mouth 2 (two) times daily.        BP 147/63  Pulse 97  Temp(Src) 99.2 F (37.3 C) (Oral)  Resp 16  SpO2 98%  Physical Exam  Nursing note and vitals reviewed.  77 year old female is resting comfortably and in no acute distress. Vital signs are normal. Oxygen saturation is 90% which is normal. Extraocular movements are full. Head is normocephalic and atraumatic. Neck is supple without adenopathy or JVD. Back is nontender. Lungs are clear without rales, wheezes, rhonchi. Heart has regular rate and rhythm without murmur. Abdomen is soft, flat, nontender without masses or hepatosplenomegaly. Extremities: There is moderate tenderness to palpation in the left shoulder rather diffusely and there is moderate to severe pain with  passive range of motion of left shoulder. There is no swelling or deformity noted. Range of motion is intact. No other extremity injuries seen. Skin is warm and moist without rash. Neurologic: Mental status is normal, cranial nerves are intact, there no focal motor or sensory deficits.  ED Course  Procedures (including critical care time)  Labs Reviewed - No data to display No results found.   No diagnosis found.  ECG shows normal sinus rhythm with a rate of 91, no ectopy. Normal axis. Normal P wave. Normal QRS. Normal intervals. Normal ST and T waves. Impression: normal ECG.No significant change when compared with ECG of 02/07/2011.  MDM  Shoulder pain, rule out fracture        Dione Booze, MD 06/28/11 [phone]

## 2011-07-12 ENCOUNTER — Other Ambulatory Visit (HOSPITAL_COMMUNITY): Payer: Self-pay | Admitting: Rheumatology

## 2011-07-12 DIAGNOSIS — IMO0002 Reserved for concepts with insufficient information to code with codable children: Secondary | ICD-10-CM

## 2011-07-17 ENCOUNTER — Ambulatory Visit: Payer: Medicare Other | Admitting: Physical Therapy

## 2011-07-20 ENCOUNTER — Encounter (HOSPITAL_COMMUNITY)
Admission: RE | Admit: 2011-07-20 | Discharge: 2011-07-20 | Disposition: A | Discharge status: Home / Self Care | Payer: Medicare Other | Source: Ambulatory Visit | Attending: Rheumatology | Admitting: Rheumatology

## 2011-07-20 ENCOUNTER — Encounter (HOSPITAL_COMMUNITY): Payer: Self-pay

## 2011-07-20 DIAGNOSIS — M549 Dorsalgia, unspecified: Secondary | ICD-10-CM | POA: Insufficient documentation

## 2011-07-20 DIAGNOSIS — Z85820 Personal history of malignant melanoma of skin: Secondary | ICD-10-CM | POA: Insufficient documentation

## 2011-07-20 DIAGNOSIS — Z9181 History of falling: Secondary | ICD-10-CM | POA: Insufficient documentation

## 2011-07-20 DIAGNOSIS — IMO0002 Reserved for concepts with insufficient information to code with codable children: Secondary | ICD-10-CM

## 2011-07-20 DIAGNOSIS — M889 Osteitis deformans of unspecified bone: Secondary | ICD-10-CM | POA: Insufficient documentation

## 2011-07-20 MED ORDER — TECHNETIUM TC 99M MEDRONATE IV KIT
23.4000 | PACK | Freq: Once | INTRAVENOUS | Status: AC | PRN
  Administered 2011-07-20: 23.4 via INTRAVENOUS

## 2011-08-02 ENCOUNTER — Other Ambulatory Visit: Payer: Self-pay | Admitting: Rheumatology

## 2011-08-02 DIAGNOSIS — M25512 Pain in left shoulder: Secondary | ICD-10-CM

## 2011-08-09 ENCOUNTER — Ambulatory Visit
Admission: RE | Admit: 2011-08-09 | Discharge: 2011-08-09 | Disposition: A | Discharge status: Home / Self Care | Payer: Medicare Other | Source: Ambulatory Visit | Attending: Rheumatology | Admitting: Rheumatology

## 2011-08-09 DIAGNOSIS — M25512 Pain in left shoulder: Secondary | ICD-10-CM

## 2011-10-25 ENCOUNTER — Other Ambulatory Visit (HOSPITAL_COMMUNITY): Payer: Self-pay | Admitting: Family Medicine

## 2011-10-25 DIAGNOSIS — Z1231 Encounter for screening mammogram for malignant neoplasm of breast: Secondary | ICD-10-CM

## 2011-11-28 ENCOUNTER — Ambulatory Visit (HOSPITAL_COMMUNITY)
Admission: RE | Admit: 2011-11-28 | Discharge: 2011-11-28 | Disposition: A | Discharge status: Home / Self Care | Payer: Medicare Other | Source: Ambulatory Visit | Attending: Family Medicine | Admitting: Family Medicine

## 2011-11-28 DIAGNOSIS — Z1231 Encounter for screening mammogram for malignant neoplasm of breast: Secondary | ICD-10-CM | POA: Insufficient documentation

## 2011-12-14 ENCOUNTER — Ambulatory Visit
Admission: RE | Admit: 2011-12-14 | Discharge: 2011-12-14 | Disposition: A | Discharge status: Home / Self Care | Payer: Medicare Other | Source: Ambulatory Visit | Attending: Neurology | Admitting: Neurology

## 2011-12-14 ENCOUNTER — Other Ambulatory Visit: Payer: Self-pay | Admitting: Neurology

## 2011-12-14 DIAGNOSIS — G609 Hereditary and idiopathic neuropathy, unspecified: Secondary | ICD-10-CM

## 2011-12-14 DIAGNOSIS — M3130 Wegener's granulomatosis without renal involvement: Secondary | ICD-10-CM

## 2011-12-14 DIAGNOSIS — R269 Unspecified abnormalities of gait and mobility: Secondary | ICD-10-CM

## 2012-01-11 ENCOUNTER — Other Ambulatory Visit: Payer: Self-pay | Admitting: Neurology

## 2012-01-11 DIAGNOSIS — R269 Unspecified abnormalities of gait and mobility: Secondary | ICD-10-CM

## 2012-01-11 DIAGNOSIS — M3130 Wegener's granulomatosis without renal involvement: Secondary | ICD-10-CM

## 2012-01-11 DIAGNOSIS — G629 Polyneuropathy, unspecified: Secondary | ICD-10-CM

## 2012-01-16 ENCOUNTER — Ambulatory Visit
Admission: RE | Admit: 2012-01-16 | Discharge: 2012-01-16 | Disposition: A | Discharge status: Home / Self Care | Payer: Medicare Other | Source: Ambulatory Visit | Attending: Neurology | Admitting: Neurology

## 2012-01-16 DIAGNOSIS — M3130 Wegener's granulomatosis without renal involvement: Secondary | ICD-10-CM

## 2012-01-16 DIAGNOSIS — R269 Unspecified abnormalities of gait and mobility: Secondary | ICD-10-CM

## 2012-01-16 DIAGNOSIS — G629 Polyneuropathy, unspecified: Secondary | ICD-10-CM

## 2012-01-22 ENCOUNTER — Other Ambulatory Visit: Payer: Self-pay | Admitting: Neurology

## 2012-01-22 DIAGNOSIS — M3130 Wegener's granulomatosis without renal involvement: Secondary | ICD-10-CM

## 2012-01-22 DIAGNOSIS — G629 Polyneuropathy, unspecified: Secondary | ICD-10-CM

## 2012-01-22 DIAGNOSIS — R269 Unspecified abnormalities of gait and mobility: Secondary | ICD-10-CM

## 2012-07-18 ENCOUNTER — Telehealth: Payer: Self-pay | Admitting: Pulmonary Disease

## 2012-07-18 NOTE — Telephone Encounter (Signed)
I spoke with pt. She stated it was not an emergency to get her in. She is scheduled to see [facility] 08/07/12 at 10:45. Nothing further was needed. Pt aware of our location.

## 2012-08-07 ENCOUNTER — Encounter: Payer: Self-pay | Admitting: Pulmonary Disease

## 2012-08-07 ENCOUNTER — Ambulatory Visit (INDEPENDENT_AMBULATORY_CARE_PROVIDER_SITE_OTHER): Payer: Medicare Other | Admitting: Pulmonary Disease

## 2012-08-07 VITALS — BP 134/76 | HR 75 | Temp 97.7°F | Ht 62.0 in | Wt 123.2 lb

## 2012-08-07 DIAGNOSIS — J45909 Unspecified asthma, uncomplicated: Secondary | ICD-10-CM

## 2012-08-07 DIAGNOSIS — M3130 Wegener's granulomatosis without renal involvement: Secondary | ICD-10-CM

## 2012-08-07 NOTE — Assessment & Plan Note (Signed)
The patient has been followed by rheumatology and also pulmonary at [facility].  As far as the patient knows, pulmonary disease has been well controlled.  I will try and get the records from [facility] for my review.

## 2012-08-07 NOTE — Assessment & Plan Note (Signed)
Patient feels that she is doing very well from an asthma standpoint on her current medications.  She has not had a recent acute exacerbation, nor has she required a recent prednisone pulse.  I've asked her to continue on her current regimen, and try and stay as active as possible.  She will followup with me in 6 months if doing well.  I will try and get her pulmonary records from [facility] for further review.

## 2012-08-07 NOTE — Patient Instructions (Addendum)
Will get your pulmonary records from [facility] to review, and will call if any questions or concerns. No change in your asthma meds. Try and stay as active as possible. followup with me in 6mos if doing well.

## 2012-08-07 NOTE — Progress Notes (Signed)
  Subjective:    Patient ID: [name], female    DOB: April 20, 1933, 80 y.o.   MRN: [id number]  HPI The patient is a 79 year old female who I've been asked to see for management of asthma.  She has been a patient of mine in the distant past, and has not been seen in 3 years.  She has a history of asthma, as well as Wegener's granulomatosis.  She is being followed closely by rheumatology, and treated with methotrexate and Bactrim.  She has a history of mild bronchial stenosis of the upper lobes at bronchoscopy, and this is felt to be related to her Wegener's.  She has had balloon dilatation at [facility] for this, but not stenting.  She tells me that her pulmonary component to her Wegener's has been adequately controlled, but I will need to get the records from [facility].  She states her asthma has been doing very well, with no recent acute exacerbation.  She has chronic dyspnea on exertion, but she feels this is at her usual baseline.   Review of Systems  Constitutional: Positive for unexpected weight change. Negative for fever.  HENT: Positive for congestion, rhinorrhea, postnasal drip and sinus pressure. Negative for ear pain, nosebleeds, sore throat, sneezing, trouble swallowing and dental problem.   Eyes: Negative for redness and itching.  Respiratory: Positive for cough ( prod). Negative for chest tightness, shortness of breath and wheezing.   Cardiovascular: Negative for palpitations and leg swelling.  Gastrointestinal: Negative for nausea and vomiting.       Indigestion  Genitourinary: Negative for dysuria.  Musculoskeletal: Negative for joint swelling.  Skin: Negative for rash.  Neurological: Negative for headaches.  Hematological: Does not bruise/bleed easily.  Psychiatric/Behavioral: Negative for dysphoric mood. The patient is not nervous/anxious.        Objective:   Physical Exam Thin female in no acute distress Nose without purulence or discharge noted Neck without  lymphadenopathy or thyromegaly Chest with a few scattered crackles, excellent airflow, no wheezing Cardiac exam with regular rate and rhythm Lower extremities without edema, varicosities are present.  No cyanosis Alert and oriented, moves all 4 extremities.       Assessment & Plan:

## 2012-09-12 ENCOUNTER — Other Ambulatory Visit: Payer: Self-pay | Admitting: Dermatology

## 2012-10-14 DIAGNOSIS — H04203 Unspecified epiphora, bilateral lacrimal glands: Secondary | ICD-10-CM | POA: Insufficient documentation

## 2012-12-09 ENCOUNTER — Other Ambulatory Visit (HOSPITAL_COMMUNITY): Payer: Self-pay | Admitting: Family Medicine

## 2012-12-09 DIAGNOSIS — Z1231 Encounter for screening mammogram for malignant neoplasm of breast: Secondary | ICD-10-CM

## 2012-12-11 ENCOUNTER — Ambulatory Visit (HOSPITAL_COMMUNITY)
Admission: RE | Admit: 2012-12-11 | Discharge: 2012-12-11 | Disposition: A | Discharge status: Home / Self Care | Payer: Medicare Other | Source: Ambulatory Visit | Attending: Family Medicine | Admitting: Family Medicine

## 2012-12-11 DIAGNOSIS — Z1231 Encounter for screening mammogram for malignant neoplasm of breast: Secondary | ICD-10-CM | POA: Insufficient documentation

## 2012-12-18 ENCOUNTER — Other Ambulatory Visit: Payer: Self-pay | Admitting: Dermatology

## 2012-12-19 ENCOUNTER — Telehealth: Payer: Self-pay | Admitting: Pulmonary Disease

## 2012-12-19 MED ORDER — PREDNISONE 20 MG PO TABS: 20.0000 mg | ORAL_TABLET | Freq: Every day | ORAL | Status: DC

## 2012-12-19 NOTE — Telephone Encounter (Signed)
Suggest prednisone 20 mg, 1 daily x 5 days then stop. # 5, no refill

## 2012-12-19 NOTE — Telephone Encounter (Signed)
Pt calls today with c/o discomfort in chest w/deep breaths and increased night sweats. Pt states that she has had her coumadin checked which was WNL and her Dr advised her to f/u with Pulmonary Clance pt. Last OV 08/07/2012 H/o mild bronchial stenosis of the upper lobes; felt to be related to her Wegener's. Pt has recently followed for this @ [facility].  Pt refused to schedule appt with MW that was offered to her until she hears back from [facility] if she NEEDS an appt today.  Please advise Dr Maple Hudson if pt needs appt today or if there are any other recs to offer. Thanks.

## 2012-12-19 NOTE — Telephone Encounter (Signed)
Pt aware of recs. rx called in. Nothing further was needed 

## 2013-02-04 ENCOUNTER — Ambulatory Visit: Payer: Medicare Other | Admitting: Pulmonary Disease

## 2013-02-11 ENCOUNTER — Ambulatory Visit (INDEPENDENT_AMBULATORY_CARE_PROVIDER_SITE_OTHER): Payer: Medicare Other | Admitting: Pulmonary Disease

## 2013-02-11 ENCOUNTER — Ambulatory Visit (INDEPENDENT_AMBULATORY_CARE_PROVIDER_SITE_OTHER)
Admission: RE | Admit: 2013-02-11 | Discharge: 2013-02-11 | Disposition: A | Discharge status: Home / Self Care | Payer: Medicare Other | Source: Ambulatory Visit | Attending: Pulmonary Disease | Admitting: Pulmonary Disease

## 2013-02-11 ENCOUNTER — Encounter: Payer: Self-pay | Admitting: Pulmonary Disease

## 2013-02-11 VITALS — BP 98/62 | HR 67 | Temp 97.8°F | Ht 61.0 in | Wt 127.6 lb

## 2013-02-11 DIAGNOSIS — M3130 Wegener's granulomatosis without renal involvement: Secondary | ICD-10-CM

## 2013-02-11 DIAGNOSIS — J4520 Mild intermittent asthma, uncomplicated: Secondary | ICD-10-CM

## 2013-02-11 DIAGNOSIS — J45909 Unspecified asthma, uncomplicated: Secondary | ICD-10-CM

## 2013-02-11 MED ORDER — FLUTICASONE-SALMETEROL 500-50 MCG/DOSE IN AEPB: 1.0000 | INHALATION_SPRAY | Freq: Two times a day (BID) | RESPIRATORY_TRACT | Status: DC

## 2013-02-11 NOTE — Assessment & Plan Note (Signed)
The patient appears to be doing well from an asthma standpoint, and has not had any acute flares or increased rescue inhaler use.  I have asked her to continue her current bronchodilator regimen.

## 2013-02-11 NOTE — Patient Instructions (Addendum)
Will check chest xray today, and call you with results. No change in medications. Will check breathing tests at next visit, which will be in 6mos.

## 2013-02-11 NOTE — Progress Notes (Signed)
  Subjective:    Patient ID: [name], female    DOB: 1932-09-06, 79 y.o.   MRN: [id number]  HPI Patient comes in today for followup of her known asthma, as well as Wegner's granulomatosis.  She is maintaining on methotrexate and Bactrim, and is being followed by rheumatology.  She feels that she is stable from a pulmonary standpoint, with no change in her chronic dyspnea on exertion.  He denies any significant cough or mucus production.  She is continuing on her maintenance bronchodilator regimen.   Review of Systems  Constitutional: Negative for fever and unexpected weight change.  HENT: Negative for ear pain, nosebleeds, congestion, sore throat, rhinorrhea, sneezing, trouble swallowing, dental problem, postnasal drip and sinus pressure.   Eyes: Negative for redness and itching.  Respiratory: Positive for shortness of breath. Negative for cough, chest tightness and wheezing.   Cardiovascular: Negative for palpitations and leg swelling.  Gastrointestinal: Negative for nausea and vomiting.  Genitourinary: Negative for dysuria.  Musculoskeletal: Negative for joint swelling.  Skin: Negative for rash.  Neurological: Negative for headaches.  Hematological: Does not bruise/bleed easily.  Psychiatric/Behavioral: Negative for dysphoric mood. The patient is not nervous/anxious.        Objective:   Physical Exam Frail-appearing female in no acute distress Nose without purulent discharge noted Neck without lymphadenopathy or thyromegaly Chest with a few rhonchi, no crackles or wheezing Cardiac exam regular rate and rhythm Lower extremities without edema, no cyanosis Alert and oriented, moves all 4 extremities.       Assessment & Plan:

## 2013-02-11 NOTE — Assessment & Plan Note (Signed)
The patient appears to be stable from this standpoint, and continues to followup with rheumatology.  She has not had breathing studies in a while, and we'll schedule this for her next visit.

## 2013-02-13 ENCOUNTER — Telehealth: Payer: Self-pay | Admitting: *Deleted

## 2013-02-14 ENCOUNTER — Other Ambulatory Visit: Payer: Self-pay | Admitting: Pulmonary Disease

## 2013-02-14 ENCOUNTER — Telehealth: Payer: Self-pay | Admitting: Pulmonary Disease

## 2013-02-14 DIAGNOSIS — M3130 Wegener's granulomatosis without renal involvement: Secondary | ICD-10-CM

## 2013-02-14 NOTE — Telephone Encounter (Signed)
Spoke with patient-- informed her results as listed below Patient agreed to have CT chest done Informed her that order would be placed and someone from our office would contact her for scheduling Will forward to Dr. Shelle Iron for orders

## 2013-02-14 NOTE — Telephone Encounter (Signed)
Order sent.

## 2013-02-14 NOTE — Telephone Encounter (Signed)
ATC patient x3 1) patients husband answered phone and phone line disonnected 2) line busy 3) rang once then to voicemail [facility] ---------- Notes Recorded by Barbaraann Share, MD on 02/11/2013 at 7:02 PM Please let pt know that her cxr does show a new abnormal area in right upper lobe compared to her prior film. The radiologist has recommended a ct chest for further characterization. This may be related to wegeners or some type of scarring. See if she is willing to have this.

## 2013-02-14 NOTE — Telephone Encounter (Signed)
Patient returning call.

## 2013-02-14 NOTE — Progress Notes (Signed)
Quick Note:  Spoke with patient-- informed her results as listed below Patient agreed to have CT chest done Informed her that order would be placed and someone from our office would contact her for scheduling ______

## 2013-02-14 NOTE — Progress Notes (Signed)
Quick Note:  ATC patient x3 1) patients husband answered phone and phone line disonnected 2) line busy 3) rang once then to voicemail [id number]   ______

## 2013-02-21 ENCOUNTER — Ambulatory Visit (INDEPENDENT_AMBULATORY_CARE_PROVIDER_SITE_OTHER)
Admission: RE | Admit: 2013-02-21 | Discharge: 2013-02-21 | Disposition: A | Discharge status: Home / Self Care | Payer: Medicare Other | Source: Ambulatory Visit | Attending: Pulmonary Disease | Admitting: Pulmonary Disease

## 2013-02-21 DIAGNOSIS — M3130 Wegener's granulomatosis without renal involvement: Secondary | ICD-10-CM

## 2013-02-24 ENCOUNTER — Telehealth: Payer: Self-pay | Admitting: Pulmonary Disease

## 2013-02-24 NOTE — Telephone Encounter (Signed)
Called pt 8/22, 8/25 and LMOM.  Asked her to call me with number where I can reach her, and will review ct with her.

## 2013-02-24 NOTE — Telephone Encounter (Signed)
I do not see where anyone called pt. She did have CT chest 02/21/13. Please advise KC thanks

## 2013-02-25 NOTE — Telephone Encounter (Signed)
Pt returning call can be reached any time after 11:30 at [phone].[name]

## 2013-02-26 NOTE — Telephone Encounter (Signed)
Pt returning call for CT results and will be at [phone] until 2:00 today.

## 2013-02-26 NOTE — Telephone Encounter (Signed)
Please advise Dr. Shelle Iron thanks

## 2013-02-26 NOTE — Telephone Encounter (Signed)
Patient is now home and can be reached at home again.  [phone]  Patient states she is anxious about CT results.

## 2013-02-27 ENCOUNTER — Telehealth: Payer: Self-pay | Admitting: Pulmonary Disease

## 2013-02-27 NOTE — Telephone Encounter (Signed)
Error/dup message ° °

## 2013-02-27 NOTE — Telephone Encounter (Signed)
Returning call.

## 2013-02-27 NOTE — Telephone Encounter (Signed)
Discussed with pt the ct results.  Most c/w an inflammatory focus, probably related to [facility]. Radiologist has recommended a f/u in 2 mos,  But pt at low risk for cancer and this area is very similar to the finding on the left which has turned into a small residual scar.  After discussing with pt, she would like to keep her apptm in about 5mos, and can discuss radiographic f/u at that time.

## 2013-03-25 NOTE — Telephone Encounter (Signed)
error 

## 2013-03-31 ENCOUNTER — Other Ambulatory Visit: Payer: Self-pay | Admitting: Dermatology

## 2013-08-11 DIAGNOSIS — H401190 Primary open-angle glaucoma, unspecified eye, stage unspecified: Secondary | ICD-10-CM | POA: Insufficient documentation

## 2013-08-15 ENCOUNTER — Ambulatory Visit: Payer: Medicare Other | Admitting: Pulmonary Disease

## 2013-08-15 ENCOUNTER — Ambulatory Visit (INDEPENDENT_AMBULATORY_CARE_PROVIDER_SITE_OTHER): Payer: Medicare Other | Admitting: Pulmonary Disease

## 2013-08-15 DIAGNOSIS — M3130 Wegener's granulomatosis without renal involvement: Secondary | ICD-10-CM

## 2013-08-15 DIAGNOSIS — J4520 Mild intermittent asthma, uncomplicated: Secondary | ICD-10-CM

## 2013-08-15 LAB — PULMONARY FUNCTION TEST
DL/VA % PRED: 88 %
DL/VA: 3.89 ml/min/mmHg/L
DLCO UNC % PRED: 68 %
DLCO UNC: 13.95 ml/min/mmHg
FEF 25-75 Post: 0.96 L/sec
FEF 25-75 Pre: 1.07 L/sec
FEF2575-%Change-Post: -10 %
FEF2575-%Pred-Post: 74 %
FEF2575-%Pred-Pre: 82 %
FEV1-%Change-Post: -1 %
FEV1-%PRED-POST: 95 %
FEV1-%Pred-Pre: 97 %
FEV1-POST: 1.63 L
FEV1-Pre: 1.66 L
FEV1FVC-%CHANGE-POST: 3 %
FEV1FVC-%Pred-Pre: 95 %
FEV6-%CHANGE-POST: -3 %
FEV6-%PRED-PRE: 105 %
FEV6-%Pred-Post: 102 %
FEV6-PRE: 2.3 L
FEV6-Post: 2.22 L
FEV6FVC-%Change-Post: 1 %
FEV6FVC-%PRED-POST: 106 %
FEV6FVC-%PRED-PRE: 104 %
FVC-%Change-Post: -5 %
FVC-%PRED-POST: 96 %
FVC-%Pred-Pre: 101 %
FVC-PRE: 2.34 L
FVC-Post: 2.22 L
POST FEV6/FVC RATIO: 100 %
PRE FEV6/FVC RATIO: 98 %
Post FEV1/FVC ratio: 73 %
Pre FEV1/FVC ratio: 71 %

## 2013-08-15 NOTE — Progress Notes (Signed)
PFT done today. 

## 2013-08-29 ENCOUNTER — Ambulatory Visit: Payer: Medicare Other | Admitting: Pulmonary Disease

## 2013-09-19 ENCOUNTER — Ambulatory Visit (INDEPENDENT_AMBULATORY_CARE_PROVIDER_SITE_OTHER): Payer: Medicare Other | Admitting: Pulmonary Disease

## 2013-09-19 ENCOUNTER — Encounter: Payer: Self-pay | Admitting: Pulmonary Disease

## 2013-09-19 VITALS — BP 118/80 | HR 63 | Temp 97.3°F | Ht 61.0 in | Wt 126.2 lb

## 2013-09-19 DIAGNOSIS — J45909 Unspecified asthma, uncomplicated: Secondary | ICD-10-CM

## 2013-09-19 DIAGNOSIS — M3130 Wegener's granulomatosis without renal involvement: Secondary | ICD-10-CM

## 2013-09-19 NOTE — Assessment & Plan Note (Signed)
The patient is due for a followup CT chest for the abnormal area in the right upper lobe it appears to be inflammatory.  Her PFTs today actually look very well, with essentially normal total lung capacity and a mild reduction in diffusion capacity that has changed very little over the years.

## 2013-09-19 NOTE — Assessment & Plan Note (Signed)
Patient feels that her breathing is stable on her current medication. Her PFTs showed no worsening of her airflows, and in fact there is very little airflow obstruction seen.

## 2013-09-19 NOTE — Progress Notes (Signed)
   Subjective:    Patient ID: [name], female    DOB: 26-Mar-1933, 80 y.o.   MRN: [id number]  HPI The patient comes in today for followup of her known asthma, as well as Wegener's granulomatosis. Overall, she is doing very well from a breathing standpoint, and feels that she is at her usual baseline. She is staying on her bronchodilator regimen, and has not had any flareup since last visit. She has some cough but is related to her known chronic sinusitis, but does not feel congested in her chest. She did have pulmonary function studies recently that showed no airflow obstruction, essentially normal total lung capacity, and only a mild reduction in DLCO. These were either very similar or improved from many years ago. She is due for a followup scan since her CT chest in August of last year showed an abnormal density in the right upper lobe that was felt to be inflammatory and probably related to her Wegener's.   Review of Systems  Constitutional: Negative for fever and unexpected weight change.  HENT: Negative for congestion, dental problem, ear pain, nosebleeds, postnasal drip, rhinorrhea, sinus pressure, sneezing, sore throat and trouble swallowing.   Eyes: Negative for redness and itching.  Respiratory: Negative for cough, chest tightness, shortness of breath and wheezing.   Cardiovascular: Negative for palpitations and leg swelling.  Gastrointestinal: Negative for nausea and vomiting.  Genitourinary: Negative for dysuria.  Musculoskeletal: Negative for joint swelling.  Skin: Negative for rash.  Neurological: Negative for headaches.  Hematological: Does not bruise/bleed easily.  Psychiatric/Behavioral: Negative for dysphoric mood. The patient is not nervous/anxious.        Objective:   Physical Exam Frail-appearing female in no acute distress Nose without purulence or discharge noted Neck without lymphadenopathy or thyromegaly Chest with mildly decreased breath sounds, no wheezes or  crackles Cardiac exam with regular rate and rhythm Lower extremities with mild ankle edema, no cyanosis Alert and oriented, moves all 4 extremities.       Assessment & Plan:

## 2013-09-19 NOTE — Patient Instructions (Signed)
Will do followup scan of chest to re-evaluate the area seen in august last year. No change in asthma medications Stay as active as possible. followup with me again in 95mos, but will call you with ct results.

## 2013-09-24 ENCOUNTER — Ambulatory Visit (INDEPENDENT_AMBULATORY_CARE_PROVIDER_SITE_OTHER)
Admission: RE | Admit: 2013-09-24 | Discharge: 2013-09-24 | Disposition: A | Discharge status: Home / Self Care | Payer: Medicare Other | Source: Ambulatory Visit | Attending: Pulmonary Disease | Admitting: Pulmonary Disease

## 2013-09-24 DIAGNOSIS — M3130 Wegener's granulomatosis without renal involvement: Secondary | ICD-10-CM

## 2013-09-25 ENCOUNTER — Other Ambulatory Visit: Payer: Medicare Other

## 2013-09-29 ENCOUNTER — Telehealth: Payer: Self-pay | Admitting: Pulmonary Disease

## 2013-09-29 NOTE — Telephone Encounter (Signed)
Notes Recorded by Virl Cagey, CMA on 09/25/2013 at 6:04 PM LMOM x 1 ------  Notes Recorded by Kathee Delton, MD on 09/25/2013 at 9:20 AM Let pt know that her abnormal area in top of right lung is significantly improved, with only scarring left. Great news. The spots in the left lung that have been present since 2012 are stable from scan last year and decreased from 2012. These are felt to be due to her Wegener's. Pt advised. [place] Bing, CMA

## 2013-10-02 ENCOUNTER — Other Ambulatory Visit: Payer: Self-pay | Admitting: Pulmonary Disease

## 2013-10-16 ENCOUNTER — Encounter (INDEPENDENT_AMBULATORY_CARE_PROVIDER_SITE_OTHER): Payer: Self-pay | Admitting: Surgery

## 2013-11-05 ENCOUNTER — Encounter (INDEPENDENT_AMBULATORY_CARE_PROVIDER_SITE_OTHER): Payer: Self-pay | Admitting: Surgery

## 2013-11-05 ENCOUNTER — Ambulatory Visit (INDEPENDENT_AMBULATORY_CARE_PROVIDER_SITE_OTHER): Payer: Medicare Other | Admitting: Surgery

## 2013-11-05 VITALS — BP 130/75 | HR 98 | Temp 96.8°F | Resp 16 | Ht 61.5 in | Wt 126.4 lb

## 2013-11-05 DIAGNOSIS — K4090 Unilateral inguinal hernia, without obstruction or gangrene, not specified as recurrent: Secondary | ICD-10-CM

## 2013-11-05 NOTE — Progress Notes (Signed)
Patient ID: [name], female   DOB: 04-19-1933, 80 y.o.   MRN: [id number]  Chief Complaint  Patient presents with  . Inguinal Hernia    HPI [name] is a 80 y.o. female.   HPI This is a very pleasant female referred by Dr. Harrington Challenger for evaluation of a left inguinal hernia. She noticed a small bulge in the left groin several weeks ago. She has no pain and reports that it easily reduces. She has no obstructive symptoms. Past Medical History  Diagnosis Date  . Wegener's granulomatosis   . OA (osteoarthritis)   . Blindness of one eye   . Hearing loss in left ear   . Bladder incontinence   . Glaucoma   . Clotting disorder     Past Surgical History  Procedure Laterality Date  . Tubal ligation    . Pubovaginal sling    . Cataract extraction    . Melanoma excision      left leg  . Vena cava filter placement      Family History  Problem Relation Age of Onset  . Heart disease Father     stroke-deceased  . Stroke Father   . Congestive Heart Failure Mother     deceased  . Heart disease Mother   . Uterine cancer Sister   . Lymphoma Sister   . Skin cancer Sister     no melanoma    Social History History  Substance Use Topics  . Smoking status: Former Smoker -- 1.50 packs/day for 30 years    Types: Cigarettes    Quit date: 07/03/1984  . Smokeless tobacco: Not on file  . Alcohol Use: Yes     Comment: 1-2 glasses    Allergies  Allergen Reactions  . Adhesive [Tape]     Current Outpatient Prescriptions  Medication Sig Dispense Refill  . ADVAIR DISKUS 500-50 MCG/DOSE AEPB INHALE 1 PUFF INTO LUNGS EVERY 12 HOURS  60 each  4  . CIPRODEX otic suspension       . diphenhydramine-acetaminophen (TYLENOL PM) 25-500 MG TABS Take 1 tablet by mouth at bedtime as needed.        . folic acid (FOLVITE) 1 MG tablet Take 1 mg by mouth daily.        Marland Kitchen latanoprost (XALATAN) 0.005 % ophthalmic solution       . methotrexate (RHEUMATREX) 2.5 MG tablet Take 10 mg by mouth once a week.  Caution:Chemotherapy. Protect from light.      . Multiple Vitamins-Minerals (MULTIVITAMIN WITH MINERALS) tablet Take 1 tablet by mouth daily.      Marland Kitchen sulfamethoxazole-trimethoprim (BACTRIM DS) 800-160 MG per tablet Take 1 tablet by mouth 3 (three) times a week.      . warfarin (COUMADIN) 2.5 MG tablet Take 2.5 mg by mouth as directed.      . zoledronic acid (RECLAST) 5 MG/100ML SOLN Inject 5 mg into the vein once. yearly       No current facility-administered medications for this visit.    Review of Systems Review of Systems  Constitutional: Negative for fever, chills and unexpected weight change.  HENT: Negative for congestion, hearing loss, sore throat, trouble swallowing and voice change.   Eyes: Negative for visual disturbance.  Respiratory: Positive for wheezing. Negative for cough.   Cardiovascular: Negative for chest pain, palpitations and leg swelling.  Gastrointestinal: Negative for nausea, vomiting, abdominal pain, diarrhea, constipation, blood in stool, abdominal distention and anal bleeding.  Genitourinary: Positive for frequency. Negative for hematuria, vaginal  bleeding and difficulty urinating.  Musculoskeletal: Positive for arthralgias.  Skin: Negative for rash and wound.  Neurological: Negative for seizures, syncope and headaches.  Hematological: Negative for adenopathy. Does not bruise/bleed easily.  Psychiatric/Behavioral: Negative for confusion.    Blood pressure 130/75, pulse 98, temperature 96.8 F (36 C), resp. rate 16, height 5' 1.5" (1.562 m), weight 126 lb 6.4 oz (57.335 kg).  Physical Exam Physical Exam  Constitutional: She is oriented to person, place, and time. She appears well-developed and well-nourished.  HENT:  Head: Normocephalic and atraumatic.  Right Ear: External ear normal.  Left Ear: External ear normal.  Nose: Nose normal.  Mouth/Throat: Oropharynx is clear and moist. No oropharyngeal exudate.  Eyes: Conjunctivae are normal. Pupils are equal,  round, and reactive to light. Right eye exhibits no discharge. Left eye exhibits no discharge. No scleral icterus.  Neck: Normal range of motion. Neck supple. No tracheal deviation present.  Cardiovascular: Normal rate, regular rhythm, normal heart sounds and intact distal pulses.   No murmur heard. Pulmonary/Chest: Effort normal and breath sounds normal. No respiratory distress. She has no wheezes.  Abdominal: Soft. Bowel sounds are normal. She exhibits no distension. There is no tenderness. There is no rebound.  Small, easily reducible left inguinal hernia. No evidence of right inguinal hernia  Musculoskeletal: Normal range of motion. She exhibits no edema and no tenderness.  Lymphadenopathy:    She has no cervical adenopathy.  Neurological: She is alert and oriented to person, place, and time.  Skin: Skin is warm and dry. No rash noted. She is not diaphoretic. No erythema.  Psychiatric: Her behavior is normal. Judgment normal.    Data Reviewed   Assessment    Left inguinal hernia     Plan    I discussed the diagnosis with the patient in detail. I discussed the risks of incarceration and strangulation. I discussed the surgical procedure for hernia repair in detail. At this time, because of her husband's dementia and chronic medical problems, she wants to hold on surgery. She will come back and see me in 2 months for reevaluation. She will call sooner should she have any left groin pain. I explained the signs and symptoms of incarceration with her as well. Should she develop signs of incarceration, she will call us ASAP and head to the emergency department        Harl Bowie 11/05/2013, 11:21 AM

## 2013-11-07 ENCOUNTER — Other Ambulatory Visit (INDEPENDENT_AMBULATORY_CARE_PROVIDER_SITE_OTHER): Payer: Self-pay | Admitting: Surgery

## 2013-12-15 ENCOUNTER — Encounter (INDEPENDENT_AMBULATORY_CARE_PROVIDER_SITE_OTHER): Payer: Self-pay | Admitting: Surgery

## 2014-01-07 ENCOUNTER — Encounter (INDEPENDENT_AMBULATORY_CARE_PROVIDER_SITE_OTHER): Payer: Self-pay | Admitting: Surgery

## 2014-01-07 ENCOUNTER — Ambulatory Visit (INDEPENDENT_AMBULATORY_CARE_PROVIDER_SITE_OTHER): Payer: Medicare Other | Admitting: Surgery

## 2014-01-07 VITALS — BP 125/72 | HR 76 | Temp 98.1°F | Resp 20 | Ht 62.0 in | Wt 126.2 lb

## 2014-01-07 DIAGNOSIS — K4090 Unilateral inguinal hernia, without obstruction or gangrene, not specified as recurrent: Secondary | ICD-10-CM

## 2014-01-07 NOTE — Progress Notes (Signed)
Subjective:     Patient ID: [name], female   DOB: Jan 04, 1933, 80 y.o.   MRN: [id number]  HPI She is here for a two-month followup after the diagnosis of a left inguinal hernia. She still has no discomfort but now wishes to proceed with repair.  Review of Systems     Objective:   Physical Exam On exam, there is a very small, easily reducible left inguinal hernia    Assessment:      left inguinal hernia     Plan:     I will schedule her for open repair with mesh. I discussed this with her in detail. She will have to hold her Coumadin 5 days preoperatively.

## 2014-02-11 ENCOUNTER — Encounter (HOSPITAL_BASED_OUTPATIENT_CLINIC_OR_DEPARTMENT_OTHER): Payer: Self-pay | Admitting: *Deleted

## 2014-02-11 NOTE — Progress Notes (Signed)
Pt on coumadin for hx PE and DVT 1997-has VC filter-no cardiac problems-has lung problems and sees dr clance-doing well and PFS good To come in 8/17 for pt

## 2014-02-16 ENCOUNTER — Encounter (HOSPITAL_BASED_OUTPATIENT_CLINIC_OR_DEPARTMENT_OTHER)
Admission: RE | Admit: 2014-02-16 | Discharge: 2014-02-16 | Disposition: A | Discharge status: Home / Self Care | Payer: Medicare Other | Source: Ambulatory Visit | Attending: Surgery | Admitting: Surgery

## 2014-02-16 DIAGNOSIS — Z87891 Personal history of nicotine dependence: Secondary | ICD-10-CM | POA: Diagnosis not present

## 2014-02-16 DIAGNOSIS — J45909 Unspecified asthma, uncomplicated: Secondary | ICD-10-CM | POA: Diagnosis not present

## 2014-02-16 DIAGNOSIS — K4090 Unilateral inguinal hernia, without obstruction or gangrene, not specified as recurrent: Secondary | ICD-10-CM | POA: Diagnosis not present

## 2014-02-16 DIAGNOSIS — H5440 Blindness, one eye, unspecified eye: Secondary | ICD-10-CM | POA: Diagnosis not present

## 2014-02-16 DIAGNOSIS — Z85820 Personal history of malignant melanoma of skin: Secondary | ICD-10-CM | POA: Diagnosis not present

## 2014-02-16 DIAGNOSIS — H9190 Unspecified hearing loss, unspecified ear: Secondary | ICD-10-CM | POA: Diagnosis not present

## 2014-02-16 DIAGNOSIS — Z7901 Long term (current) use of anticoagulants: Secondary | ICD-10-CM | POA: Diagnosis not present

## 2014-02-16 DIAGNOSIS — Z79899 Other long term (current) drug therapy: Secondary | ICD-10-CM | POA: Diagnosis not present

## 2014-02-16 LAB — APTT: aPTT: 33 seconds (ref 24–37)

## 2014-02-16 LAB — PROTIME-INR
INR: 1.04 (ref 0.00–1.49)
PROTHROMBIN TIME: 13.6 s (ref 11.6–15.2)

## 2014-02-16 NOTE — H&P (Signed)
Chief Complaint   Patient presents with   .  Inguinal Hernia   HPI  [name] is a 80 y.o. female.  HPI  This is a very pleasant female referred by Dr. Harrington Challenger for evaluation of a left inguinal hernia. She noticed a small bulge in the left groin several weeks ago. She has no pain and reports that it easily reduces. She has no obstructive symptoms.  Past Medical History   Diagnosis  Date   .  Wegener's granulomatosis    .  OA (osteoarthritis)    .  Blindness of one eye    .  Hearing loss in left ear    .  Bladder incontinence    .  Glaucoma    .  Clotting disorder     Past Surgical History   Procedure  Laterality  Date   .  Tubal ligation     .  Pubovaginal sling     .  Cataract extraction     .  Melanoma excision       left leg   .  Vena cava filter placement      Family History   Problem  Relation  Age of Onset   .  Heart disease  Father      stroke-deceased   .  Stroke  Father    .  Congestive Heart Failure  Mother      deceased   .  Heart disease  Mother    .  Uterine cancer  Sister    .  Lymphoma  Sister    .  Skin cancer  Sister      no melanoma   Social History  History   Substance Use Topics   .  Smoking status:  Former Smoker -- 1.50 packs/day for 30 years     Types:  Cigarettes     Quit date:  07/03/1984   .  Smokeless tobacco:  Not on file   .  Alcohol Use:  Yes      Comment: 1-2 glasses    Allergies   Allergen  Reactions   .  Adhesive [Tape]     Current Outpatient Prescriptions   Medication  Sig  Dispense  Refill   .  ADVAIR DISKUS 500-50 MCG/DOSE AEPB  INHALE 1 PUFF INTO LUNGS EVERY 12 HOURS  60 each  4   .  CIPRODEX otic suspension      .  diphenhydramine-acetaminophen (TYLENOL PM) 25-500 MG TABS  Take 1 tablet by mouth at bedtime as needed.     .  folic acid (FOLVITE) 1 MG tablet  Take 1 mg by mouth daily.     Marland Kitchen  latanoprost (XALATAN) 0.005 % ophthalmic solution      .  methotrexate (RHEUMATREX) 2.5 MG tablet  Take 10 mg by mouth once a week.  Caution:Chemotherapy. Protect from light.     .  Multiple Vitamins-Minerals (MULTIVITAMIN WITH MINERALS) tablet  Take 1 tablet by mouth daily.     Marland Kitchen  sulfamethoxazole-trimethoprim (BACTRIM DS) 800-160 MG per tablet  Take 1 tablet by mouth 3 (three) times a week.     .  warfarin (COUMADIN) 2.5 MG tablet  Take 2.5 mg by mouth as directed.     .  zoledronic acid (RECLAST) 5 MG/100ML SOLN  Inject 5 mg into the vein once. yearly      No current facility-administered medications for this visit.   Review of Systems  Review of Systems  Constitutional:  Negative for fever, chills and unexpected weight change.  HENT: Negative for congestion, hearing loss, sore throat, trouble swallowing and voice change.  Eyes: Negative for visual disturbance.  Respiratory: Positive for wheezing. Negative for cough.  Cardiovascular: Negative for chest pain, palpitations and leg swelling.  Gastrointestinal: Negative for nausea, vomiting, abdominal pain, diarrhea, constipation, blood in stool, abdominal distention and anal bleeding.  Genitourinary: Positive for frequency. Negative for hematuria, vaginal bleeding and difficulty urinating.  Musculoskeletal: Positive for arthralgias.  Skin: Negative for rash and wound.  Neurological: Negative for seizures, syncope and headaches.  Hematological: Negative for adenopathy. Does not bruise/bleed easily.  Psychiatric/Behavioral: Negative for confusion.  Blood pressure 130/75, pulse 98, temperature 96.8 F (36 C), resp. rate 16, height 5' 1.5" (1.562 m), weight 126 lb 6.4 oz (57.335 kg).  Physical Exam  Physical Exam  Constitutional: She is oriented to person, place, and time. She appears well-developed and well-nourished.  HENT:  Head: Normocephalic and atraumatic.  Right Ear: External ear normal.  Left Ear: External ear normal.  Nose: Nose normal.  Mouth/Throat: Oropharynx is clear and moist. No oropharyngeal exudate.  Eyes: Conjunctivae are normal. Pupils are equal,  round, and reactive to light. Right eye exhibits no discharge. Left eye exhibits no discharge. No scleral icterus.  Neck: Normal range of motion. Neck supple. No tracheal deviation present.  Cardiovascular: Normal rate, regular rhythm, normal heart sounds and intact distal pulses.  No murmur heard.  Pulmonary/Chest: Effort normal and breath sounds normal. No respiratory distress. She has no wheezes.  Abdominal: Soft. Bowel sounds are normal. She exhibits no distension. There is no tenderness. There is no rebound.  Small, easily reducible left inguinal hernia. No evidence of right inguinal hernia  Musculoskeletal: Normal range of motion. She exhibits no edema and no tenderness.  Lymphadenopathy:  She has no cervical adenopathy.  Neurological: She is alert and oriented to person, place, and time.  Skin: Skin is warm and dry. No rash noted. She is not diaphoretic. No erythema.  Psychiatric: Her behavior is normal. Judgment normal.  Data Reviewed  Assessment  Left inguinal hernia  Plan  She now wishes to proceed with left inguinal hernia repair with mesh. I discussed this with her in detail. I discussed the risks of surgery which includes but is not limited to bleeding, infection, recurrence, chronic pain, nerve entrapment, etc. I also discussed postoperative recovery. She wishes to proceed with surgery.

## 2014-02-17 ENCOUNTER — Encounter (HOSPITAL_BASED_OUTPATIENT_CLINIC_OR_DEPARTMENT_OTHER): Payer: Self-pay | Admitting: Certified Registered"

## 2014-02-17 ENCOUNTER — Encounter (HOSPITAL_BASED_OUTPATIENT_CLINIC_OR_DEPARTMENT_OTHER): Payer: Medicare Other | Admitting: Certified Registered"

## 2014-02-17 ENCOUNTER — Ambulatory Visit (HOSPITAL_BASED_OUTPATIENT_CLINIC_OR_DEPARTMENT_OTHER): Payer: Medicare Other | Admitting: Certified Registered"

## 2014-02-17 ENCOUNTER — Encounter (HOSPITAL_BASED_OUTPATIENT_CLINIC_OR_DEPARTMENT_OTHER)
Admission: RE | Disposition: A | Discharge status: Home / Self Care | Payer: Self-pay | Source: Ambulatory Visit | Attending: Surgery

## 2014-02-17 ENCOUNTER — Ambulatory Visit (HOSPITAL_BASED_OUTPATIENT_CLINIC_OR_DEPARTMENT_OTHER)
Admission: RE | Admit: 2014-02-17 | Discharge: 2014-02-17 | Disposition: A | Discharge status: Home / Self Care | Payer: Medicare Other | Source: Ambulatory Visit | Attending: Surgery | Admitting: Surgery

## 2014-02-17 DIAGNOSIS — Z85820 Personal history of malignant melanoma of skin: Secondary | ICD-10-CM | POA: Insufficient documentation

## 2014-02-17 DIAGNOSIS — Z79899 Other long term (current) drug therapy: Secondary | ICD-10-CM | POA: Insufficient documentation

## 2014-02-17 DIAGNOSIS — K4090 Unilateral inguinal hernia, without obstruction or gangrene, not specified as recurrent: Secondary | ICD-10-CM | POA: Diagnosis not present

## 2014-02-17 DIAGNOSIS — H9190 Unspecified hearing loss, unspecified ear: Secondary | ICD-10-CM | POA: Insufficient documentation

## 2014-02-17 DIAGNOSIS — J45909 Unspecified asthma, uncomplicated: Secondary | ICD-10-CM | POA: Diagnosis not present

## 2014-02-17 DIAGNOSIS — H5440 Blindness, one eye, unspecified eye: Secondary | ICD-10-CM | POA: Insufficient documentation

## 2014-02-17 DIAGNOSIS — Z7901 Long term (current) use of anticoagulants: Secondary | ICD-10-CM | POA: Insufficient documentation

## 2014-02-17 DIAGNOSIS — Z87891 Personal history of nicotine dependence: Secondary | ICD-10-CM | POA: Insufficient documentation

## 2014-02-17 HISTORY — PX: INGUINAL HERNIA REPAIR: SHX194

## 2014-02-17 HISTORY — PX: INSERTION OF MESH: SHX5868

## 2014-02-17 HISTORY — DX: Personal history of pulmonary embolism: Z86.711

## 2014-02-17 LAB — POCT I-STAT, CHEM 8
BUN: 9 mg/dL (ref 6–23)
CREATININE: 0.8 mg/dL (ref 0.50–1.10)
Calcium, Ion: 1.32 mmol/L — ABNORMAL HIGH (ref 1.13–1.30)
Chloride: 108 mEq/L (ref 96–112)
Glucose, Bld: 89 mg/dL (ref 70–99)
HCT: 43 % (ref 36.0–46.0)
Hemoglobin: 14.6 g/dL (ref 12.0–15.0)
Potassium: 3.9 mEq/L (ref 3.7–5.3)
SODIUM: 141 meq/L (ref 137–147)
TCO2: 23 mmol/L (ref 0–100)

## 2014-02-17 SURGERY — REPAIR, HERNIA, INGUINAL, ADULT
Anesthesia: General | Site: Groin

## 2014-02-17 MED ORDER — OXYCODONE HCL 5 MG PO TABS: 5.0000 mg | ORAL_TABLET | ORAL | Status: DC | PRN

## 2014-02-17 MED ORDER — OXYCODONE HCL 5 MG PO TABS: 5.0000 mg | ORAL_TABLET | Freq: Once | ORAL | Status: DC | PRN

## 2014-02-17 MED ORDER — PROPOFOL 10 MG/ML IV EMUL
INTRAVENOUS | Status: AC
  Filled 2014-02-17: qty 50

## 2014-02-17 MED ORDER — SODIUM BICARBONATE 4 % IV SOLN
INTRAVENOUS | Status: AC
  Filled 2014-02-17: qty 10

## 2014-02-17 MED ORDER — FENTANYL CITRATE 0.05 MG/ML IJ SOLN
50.0000 ug | INTRAMUSCULAR | Status: DC | PRN
  Administered 2014-02-17: 50 ug via INTRAVENOUS

## 2014-02-17 MED ORDER — MIDAZOLAM HCL 2 MG/2ML IJ SOLN: 1.0000 mg | INTRAMUSCULAR | Status: DC | PRN

## 2014-02-17 MED ORDER — FENTANYL CITRATE 0.05 MG/ML IJ SOLN
INTRAMUSCULAR | Status: DC | PRN
  Administered 2014-02-17: 50 ug via INTRAVENOUS

## 2014-02-17 MED ORDER — ONDANSETRON HCL 4 MG/2ML IJ SOLN: 4.0000 mg | Freq: Once | INTRAMUSCULAR | Status: DC | PRN

## 2014-02-17 MED ORDER — PROPOFOL 10 MG/ML IV BOLUS
INTRAVENOUS | Status: DC | PRN
  Administered 2014-02-17: 100 mg via INTRAVENOUS

## 2014-02-17 MED ORDER — FENTANYL CITRATE 0.05 MG/ML IJ SOLN: 25.0000 ug | INTRAMUSCULAR | Status: DC | PRN

## 2014-02-17 MED ORDER — SODIUM CHLORIDE 0.9 % IJ SOLN: 3.0000 mL | INTRAMUSCULAR | Status: DC | PRN

## 2014-02-17 MED ORDER — DEXAMETHASONE SODIUM PHOSPHATE 4 MG/ML IJ SOLN
INTRAMUSCULAR | Status: DC | PRN
  Administered 2014-02-17: 4 mg via INTRAVENOUS

## 2014-02-17 MED ORDER — SUCCINYLCHOLINE CHLORIDE 20 MG/ML IJ SOLN
INTRAMUSCULAR | Status: AC
  Filled 2014-02-17: qty 1

## 2014-02-17 MED ORDER — ACETAMINOPHEN 325 MG PO TABS: 650.0000 mg | ORAL_TABLET | ORAL | Status: DC | PRN

## 2014-02-17 MED ORDER — CEFAZOLIN SODIUM-DEXTROSE 2-3 GM-% IV SOLR
INTRAVENOUS | Status: AC
  Filled 2014-02-17: qty 50

## 2014-02-17 MED ORDER — SODIUM CHLORIDE 0.9 % IJ SOLN: 3.0000 mL | Freq: Two times a day (BID) | INTRAMUSCULAR | Status: DC

## 2014-02-17 MED ORDER — FENTANYL CITRATE 0.05 MG/ML IJ SOLN
INTRAMUSCULAR | Status: AC
  Filled 2014-02-17: qty 6

## 2014-02-17 MED ORDER — ACETAMINOPHEN 650 MG RE SUPP: 650.0000 mg | RECTAL | Status: DC | PRN

## 2014-02-17 MED ORDER — MORPHINE SULFATE 2 MG/ML IJ SOLN: 1.0000 mg | INTRAMUSCULAR | Status: DC | PRN

## 2014-02-17 MED ORDER — MIDAZOLAM HCL 2 MG/2ML IJ SOLN
INTRAMUSCULAR | Status: AC
  Filled 2014-02-17: qty 2

## 2014-02-17 MED ORDER — SODIUM CHLORIDE 0.9 % IV SOLN: 250.0000 mL | INTRAVENOUS | Status: DC | PRN

## 2014-02-17 MED ORDER — LACTATED RINGERS IV SOLN
INTRAVENOUS | Status: DC
  Administered 2014-02-17: 07:00:00 via INTRAVENOUS

## 2014-02-17 MED ORDER — LIDOCAINE HCL (PF) 1 % IJ SOLN
INTRAMUSCULAR | Status: AC
  Filled 2014-02-17: qty 60

## 2014-02-17 MED ORDER — FENTANYL CITRATE 0.05 MG/ML IJ SOLN
INTRAMUSCULAR | Status: AC
  Filled 2014-02-17: qty 2

## 2014-02-17 MED ORDER — LIDOCAINE HCL (PF) 1 % IJ SOLN
INTRAMUSCULAR | Status: DC | PRN
  Administered 2014-02-17: 10 mL

## 2014-02-17 MED ORDER — EPHEDRINE SULFATE 50 MG/ML IJ SOLN
INTRAMUSCULAR | Status: DC | PRN
  Administered 2014-02-17: 10 mg via INTRAVENOUS

## 2014-02-17 MED ORDER — HYDROCODONE-ACETAMINOPHEN 5-325 MG PO TABS: 1.0000 | ORAL_TABLET | Freq: Four times a day (QID) | ORAL | Status: DC | PRN

## 2014-02-17 MED ORDER — CEFAZOLIN SODIUM-DEXTROSE 2-3 GM-% IV SOLR
2.0000 g | INTRAVENOUS | Status: AC
  Administered 2014-02-17: 2 g via INTRAVENOUS

## 2014-02-17 MED ORDER — BUPIVACAINE-EPINEPHRINE (PF) 0.5% -1:200000 IJ SOLN
INTRAMUSCULAR | Status: AC
  Filled 2014-02-17: qty 60

## 2014-02-17 MED ORDER — LIDOCAINE HCL (CARDIAC) 20 MG/ML IV SOLN
INTRAVENOUS | Status: DC | PRN
  Administered 2014-02-17: 30 mg via INTRAVENOUS

## 2014-02-17 MED ORDER — ONDANSETRON HCL 4 MG/2ML IJ SOLN
INTRAMUSCULAR | Status: DC | PRN
  Administered 2014-02-17: 4 mg via INTRAVENOUS

## 2014-02-17 MED ORDER — OXYCODONE HCL 5 MG/5ML PO SOLN: 5.0000 mg | Freq: Once | ORAL | Status: DC | PRN

## 2014-02-17 SURGICAL SUPPLY — 48 items
APL SKNCLS STERI-STRIP NONHPOA (GAUZE/BANDAGES/DRESSINGS) ×2
BENZOIN TINCTURE PRP APPL 2/3 (GAUZE/BANDAGES/DRESSINGS) ×4 IMPLANT
BLADE CLIPPER SURG (BLADE) ×4 IMPLANT
BLADE HEX COATED 2.75 (ELECTRODE) ×4 IMPLANT
BLADE SURG 10 STRL SS (BLADE) ×4 IMPLANT
CHLORAPREP W/TINT 26ML (MISCELLANEOUS) ×4 IMPLANT
CLOSURE WOUND 1/2 X4 (GAUZE/BANDAGES/DRESSINGS) ×1
COVER MAYO STAND STRL (DRAPES) ×4 IMPLANT
COVER TABLE BACK 60X90 (DRAPES) ×4 IMPLANT
DECANTER SPIKE VIAL GLASS SM (MISCELLANEOUS) ×2 IMPLANT
DRAIN PENROSE 1/2X12 LTX STRL (WOUND CARE) ×4 IMPLANT
DRAPE PED LAPAROTOMY (DRAPES) ×4 IMPLANT
DRAPE UTILITY XL STRL (DRAPES) ×4 IMPLANT
DRSG TEGADERM 4X4.75 (GAUZE/BANDAGES/DRESSINGS) ×4 IMPLANT
ELECT REM PT RETURN 9FT ADLT (ELECTROSURGICAL) ×4
ELECTRODE REM PT RTRN 9FT ADLT (ELECTROSURGICAL) ×2 IMPLANT
GLOVE BIOGEL PI IND STRL 7.0 (GLOVE) IMPLANT
GLOVE BIOGEL PI INDICATOR 7.0 (GLOVE) ×2
GLOVE ECLIPSE 6.5 STRL STRAW (GLOVE) ×2 IMPLANT
GLOVE SURG SIGNA 7.5 PF LTX (GLOVE) ×4 IMPLANT
GOWN STRL REUS W/ TWL LRG LVL3 (GOWN DISPOSABLE) ×2 IMPLANT
GOWN STRL REUS W/ TWL XL LVL3 (GOWN DISPOSABLE) ×2 IMPLANT
GOWN STRL REUS W/TWL LRG LVL3 (GOWN DISPOSABLE) ×4
GOWN STRL REUS W/TWL XL LVL3 (GOWN DISPOSABLE) ×4
MESH PARIETEX PROGRIP LEFT (Mesh General) ×2 IMPLANT
NDL HYPO 25X1 1.5 SAFETY (NEEDLE) ×2 IMPLANT
NEEDLE HYPO 22GX1.5 SAFETY (NEEDLE) ×4 IMPLANT
NEEDLE HYPO 25X1 1.5 SAFETY (NEEDLE) ×4 IMPLANT
NS IRRIG 1000ML POUR BTL (IV SOLUTION) IMPLANT
PACK BASIN DAY SURGERY FS (CUSTOM PROCEDURE TRAY) ×4 IMPLANT
PENCIL BUTTON HOLSTER BLD 10FT (ELECTRODE) ×4 IMPLANT
SLEEVE SCD COMPRESS KNEE MED (MISCELLANEOUS) ×2 IMPLANT
SPONGE GAUZE 4X4 12PLY STER LF (GAUZE/BANDAGES/DRESSINGS) ×4 IMPLANT
SPONGE INTESTINAL PEANUT (DISPOSABLE) ×2 IMPLANT
SPONGE LAP 4X18 X RAY DECT (DISPOSABLE) ×4 IMPLANT
STRIP CLOSURE SKIN 1/2X4 (GAUZE/BANDAGES/DRESSINGS) ×3 IMPLANT
SUT MNCRL AB 4-0 PS2 18 (SUTURE) ×4 IMPLANT
SUT SILK 2 0 SH (SUTURE) IMPLANT
SUT SURG 0 T 19/GS 22 1969 62 (SUTURE) IMPLANT
SUT VIC AB 2-0 CT1 27 (SUTURE) ×4
SUT VIC AB 2-0 CT1 TAPERPNT 27 (SUTURE) ×2 IMPLANT
SUT VIC AB 3-0 CT1 27 (SUTURE) ×4
SUT VIC AB 3-0 CT1 27XBRD (SUTURE) ×2 IMPLANT
SUT VICRYL AB 3 0 TIES (SUTURE) ×4 IMPLANT
SYR BULB 3OZ (MISCELLANEOUS) IMPLANT
SYR CONTROL 10ML LL (SYRINGE) ×4 IMPLANT
TOWEL OR 17X24 6PK STRL BLUE (TOWEL DISPOSABLE) ×8 IMPLANT
TOWEL OR NON WOVEN STRL DISP B (DISPOSABLE) ×2 IMPLANT

## 2014-02-17 NOTE — Anesthesia Procedure Notes (Addendum)
Procedure Name: LMA Insertion Date/Time: 02/17/2014 7:40 AM Performed by: BLOCKER, TIMOTHY Pre-anesthesia Checklist: Patient identified, Emergency Drugs available, Suction available and Patient being monitored Patient Re-evaluated:Patient Re-evaluated prior to inductionOxygen Delivery Method: Circle System Utilized Preoxygenation: Pre-oxygenation with 100% oxygen Intubation Type: IV induction Ventilation: Mask ventilation without difficulty LMA: LMA inserted LMA Size: 4.0 Number of attempts: 1 Airway Equipment and Method: bite block Placement Confirmation: positive ETCO2 Tube secured with: Tape Dental Injury: Teeth and Oropharynx as per pre-operative assessment    Anesthesia Regional Block:  TAP block  Pre-Anesthetic Checklist: ,, timeout performed, Correct Patient, Correct Site, Correct Laterality, Correct Procedure, Correct Position, site marked, Risks and benefits discussed,  Surgical consent,  Pre-op evaluation,  At surgeon's request and post-op pain management  Laterality: Left  Prep: chloraprep       Needles:  Injection technique: Single-shot  Needle Type: Echogenic Stimulator Needle     Needle Length: 9cm 9 cm Needle Gauge: 22 and 22 G    Additional Needles:  Procedures: ultrasound guided (picture in chart) TAP block Narrative:  Start time: 02/17/2014 7:20 AM End time: 02/17/2014 7:25 AM Injection made incrementally with aspirations every 5 mL.  Performed by: Personally   Additional Notes: 25 cc 0.5% Marcaine 1:200 Epi injected easily

## 2014-02-17 NOTE — Discharge Instructions (Signed)
[facility] _______[facility] ° °UMBILICAL OR INGUINAL HERNIA REPAIR: POST OP INSTRUCTIONS ° °Always review your discharge instruction sheet given to you by the facility where your surgery was performed. °IF YOU HAVE DISABILITY OR FAMILY LEAVE FORMS, YOU MUST BRING THEM TO THE OFFICE FOR PROCESSING.   °DO NOT GIVE THEM TO YOUR DOCTOR. ° °1. A  prescription for pain medication may be given to you upon discharge.  Take your pain medication as prescribed, if needed.  If narcotic pain medicine is not needed, then you may take acetaminophen (Tylenol) or ibuprofen (Advil) as needed. °2. Take your usually prescribed medications unless otherwise directed. °3. If you need a refill on your pain medication, please contact your pharmacy.  They will contact our office to request authorization. Prescriptions will not be filled after 5 pm or on week-ends. °4. You should follow a light diet the first 24 hours after arrival home, such as soup and crackers, etc.  Be sure to include lots of fluids daily.  Resume your normal diet the day after surgery. °5. Most patients will experience some swelling and bruising around the umbilicus or in the groin and scrotum.  Ice packs and reclining will help.  Swelling and bruising can take several days to resolve.  °6. It is common to experience some constipation if taking pain medication after surgery.  Increasing fluid intake and taking a stool softener (such as Colace) will usually help or prevent this problem from occurring.  A mild laxative (Milk of Magnesia or Miralax) should be taken according to package directions if there are no bowel movements after 48 hours. °7. Unless discharge instructions indicate otherwise, you may remove your bandages 24-48 hours after surgery, and you may shower at that time.  You may have steri-strips (small skin tapes) in place directly over the incision.  These strips should be left on the skin for 7-10 days.  If your surgeon used skin glue on the  incision, you may shower in 24 hours.  The glue will flake off over the next 2-3 weeks.  Any sutures or staples will be removed at the office during your follow-up visit. °8. ACTIVITIES:  You may resume regular (light) daily activities beginning the next day--such as daily self-care, walking, climbing stairs--gradually increasing activities as tolerated.  You may have sexual intercourse when it is comfortable.  Refrain from any heavy lifting or straining until approved by your doctor. °a. You may drive when you are no longer taking prescription pain medication, you can comfortably wear a seatbelt, and you can safely maneuver your car and apply brakes. °b. RETURN TO WORK:  __________________________________________________________ °9. You should see your doctor in the office for a follow-up appointment approximately 2-3 weeks after your surgery.  Make sure that you call for this appointment within a day or two after you arrive home to insure a convenient appointment time. °10. OTHER INSTRUCTIONS:  __________________________________________________________________________________________________________________________________________________________________________________________  °WHEN TO CALL YOUR DOCTOR: °1. Fever over 101.0 °2. Inability to urinate °3. Nausea and/or vomiting °4. Extreme swelling or bruising °5. Continued bleeding from incision. °6. Increased pain, redness, or drainage from the incision ° °The clinic staff is available to answer your questions during regular business hours.  Please don’t hesitate to call and ask to speak to one of the nurses for clinical concerns.  If you have a medical emergency, go to the nearest emergency room or call 911.  A surgeon from [facility] is always on call at the hospital ° ° °  [address], [address], [place], [place]  [address] ? ° [address] [address], [place], [place]   [address] °[phone] ? [phone] ? FAX [phone] °Web site:  [email or web address] ° ° ° °Post Anesthesia Home Care Instructions ° °Activity: °Get plenty of rest for the remainder of the day. A responsible adult should stay with you for 24 hours following the procedure.  °For the next 24 hours, DO NOT: °-Drive a car °-Operate machinery °-Drink alcoholic beverages °-Take any medication unless instructed by your physician °-Make any legal decisions or sign important papers. ° °Meals: °Start with liquid foods such as gelatin or soup. Progress to regular foods as tolerated. Avoid greasy, spicy, heavy foods. If nausea and/or vomiting occur, drink only clear liquids until the nausea and/or vomiting subsides. Call your physician if vomiting continues. ° °Special Instructions/Symptoms: °Your throat may feel dry or sore from the anesthesia or the breathing tube placed in your throat during surgery. If this causes discomfort, gargle with warm salt water. The discomfort should disappear within 24 hours. ° °

## 2014-02-17 NOTE — Anesthesia Postprocedure Evaluation (Signed)
  Anesthesia Post-op Note  Patient: [name]  Procedure(s) Performed: Procedure(s): LEFT INGUINAL HERNIA REPAIR WITH MESH (Left) INSERTION OF MESH (N/A)  Patient Location: PACU  Anesthesia Type:General and GA combined with regional for post-op pain  Level of Consciousness: awake, alert  and oriented  Airway and Oxygen Therapy: Patient Spontanous Breathing  Post-op Pain: none  Post-op Assessment: Post-op Vital signs reviewed, Patient's Cardiovascular Status Stable, Respiratory Function Stable, Patent Airway, No signs of Nausea or vomiting and Pain level controlled  Post-op Vital Signs: stable  Last Vitals:  Filed Vitals:   02/17/14 0830  BP: 159/140  Pulse: 77  Temp:   Resp: 13    Complications: No apparent anesthesia complications

## 2014-02-17 NOTE — Anesthesia Preprocedure Evaluation (Addendum)
Anesthesia Evaluation  Patient identified by MRN, date of birth, ID band Patient awake and Patient unresponsive    Reviewed: Allergy & Precautions, NPO status   Airway       Dental   Pulmonary asthma , former smoker,          Cardiovascular     Neuro/Psych    GI/Hepatic   Endo/Other    Renal/GU      Musculoskeletal   Abdominal   Peds  Hematology Wegners granuloma   Anesthesia Other Findings   Reproductive/Obstetrics                        Anesthesia Physical Anesthesia Plan  ASA: III  Anesthesia Plan:    Post-op Pain Management:    Induction:   Airway Management Planned:   Additional Equipment:   Intra-op Plan:   Post-operative Plan:   Informed Consent:   Plan Discussed with:   Anesthesia Plan Comments:         Anesthesia Quick Evaluation

## 2014-02-17 NOTE — Op Note (Signed)
LEFT INGUINAL HERNIA REPAIR WITH MESH, INSERTION OF MESH  Procedure Note  SHAQUAYA WUELLNER 02/17/2014   Pre-op Diagnosis: left inguinal hernia     Post-op Diagnosis: same  Procedure(s): LEFT INGUINAL HERNIA REPAIR WITH MESH INSERTION OF MESH  Surgeon(s): Harl Bowie, MD  Anesthesia: General  Staff:  Circulator: Ernesto Rutherford, RN Scrub Person: Silvio Clayman, CST  Estimated Blood Loss: Minimal               Indications: This is a 80 year old female who presents with a left inguinal hernia. She desires repair. Her Coumadin has been held preoperatively  Procedure: The patient was brought to the operating room and identified as the correct patient. She had already undergone an ilioinguinal nerve block in the holding area by anesthesia. She was placed supine on the operating table and general anesthesia was induced. Her right lower quadrant and groin were then prepped and draped in the usual sterile fashion. I anesthetized the skin with 1% lidocaine and made a longitudinal incision in the left groin with a scalpel. I took this down through Scarpa's fascia with the electrocautery. The external oblique fascia was then identified and opened toward the internal and extra range. The patient had a small direct hernia defect. I invert it the hernia sac and reduced it back into the abdominal cavity. I then closed the defect with a figure-of-eight 0 Vicryl suture. I then brought a piece of Prolene pro-grip mesh onto the field. I placed it on the left inguinal floor. I then sutured to the pubic tubercle with a 2-0 Vicryl suture. Wide coverage of the inguinal floor was achieved. I then closed the external oblique fascia over the mesh with a running 2-0 Vicryl suture. I reapproximated Scarpa's fascias with interrupted 3-0 Vicryl sutures and closed the skin with a running 4-0 Monocryl. Steri-Strips, gauze, and Tegaderm were then applied. The patient tolerated the procedure well. All the counts  were correct at the end of the procedure. The patient was then extubated in the operating room and taken in a stable condition to the recovery room.          Chantal Worthey A   Date: 02/17/2014  Time: 8:12 AM

## 2014-02-17 NOTE — Interval H&P Note (Signed)
History and Physical Interval Note: no change in H and P  02/17/2014 7:03 AM  [name]  has presented today for surgery, with the diagnosis of left inguinal hernia  The various methods of treatment have been discussed with the patient and family. After consideration of risks, benefits and other options for treatment, the patient has consented to  Procedure(s): LEFT INGUINAL HERNIA REPAIR WITH MESH (Left) INSERTION OF MESH (N/A) as a surgical intervention .  The patient's history has been reviewed, patient examined, no change in status, stable for surgery.  I have reviewed the patient's chart and labs.  Questions were answered to the patient's satisfaction.     Jessieca Rhem A

## 2014-02-17 NOTE — Transfer of Care (Signed)
Immediate Anesthesia Transfer of Care Note  Patient: [name]  Procedure(s) Performed: Procedure(s): LEFT INGUINAL HERNIA REPAIR WITH MESH (Left) INSERTION OF MESH (N/A)  Patient Location: PACU  Anesthesia Type:GA combined with regional for post-op pain  Level of Consciousness: awake and patient cooperative  Airway & Oxygen Therapy: Patient Spontanous Breathing and Patient connected to face mask oxygen  Post-op Assessment: Report given to PACU RN and Post -op Vital signs reviewed and stable  Post vital signs: Reviewed and stable  Complications: No apparent anesthesia complications

## 2014-02-17 NOTE — Progress Notes (Signed)
Assisted Dr. Joslin with left, ultrasound guided, transabdominal plane block. Side rails up, monitors on throughout procedure. See vital signs in flow sheet. Tolerated Procedure well. 

## 2014-02-18 ENCOUNTER — Encounter (HOSPITAL_BASED_OUTPATIENT_CLINIC_OR_DEPARTMENT_OTHER): Payer: Self-pay | Admitting: Surgery

## 2014-03-06 ENCOUNTER — Encounter (INDEPENDENT_AMBULATORY_CARE_PROVIDER_SITE_OTHER): Payer: Medicare Other | Admitting: Surgery

## 2014-03-18 ENCOUNTER — Encounter: Payer: Self-pay | Admitting: Pulmonary Disease

## 2014-03-18 ENCOUNTER — Ambulatory Visit (INDEPENDENT_AMBULATORY_CARE_PROVIDER_SITE_OTHER): Payer: Medicare Other | Admitting: Pulmonary Disease

## 2014-03-18 VITALS — BP 90/72 | HR 90 | Temp 98.7°F | Ht 61.0 in | Wt 128.2 lb

## 2014-03-18 DIAGNOSIS — J45909 Unspecified asthma, uncomplicated: Secondary | ICD-10-CM

## 2014-03-18 DIAGNOSIS — J019 Acute sinusitis, unspecified: Secondary | ICD-10-CM | POA: Insufficient documentation

## 2014-03-18 DIAGNOSIS — J0191 Acute recurrent sinusitis, unspecified: Secondary | ICD-10-CM

## 2014-03-18 DIAGNOSIS — M3130 Wegener's granulomatosis without renal involvement: Secondary | ICD-10-CM

## 2014-03-18 MED ORDER — AMOXICILLIN-POT CLAVULANATE 875-125 MG PO TABS: 1.0000 | ORAL_TABLET | Freq: Two times a day (BID) | ORAL | Status: DC

## 2014-03-18 NOTE — Assessment & Plan Note (Signed)
The patient is doing well from a breathing standpoint on her current bronchodilator regimen, and I've asked her to continue with this. Her most recent PFTs showed no airflow obstruction.

## 2014-03-18 NOTE — Patient Instructions (Signed)
Continue on your current breathing medications. Stay on your sinus rinses. Will treat you with a course of augmentin 875mg  am and pm on a full stomach with a large glass of water for 7 days.  If you feel this does not help your sinus drainage/cough, would consider getting back with ENT doctor for evaluation. Stay as active as possible. followup with me again in 45mos.

## 2014-03-18 NOTE — Assessment & Plan Note (Signed)
The patient has acute on chronic sinusitis by her history, and will treat her with a course of antibiotics to see if things improve. I have also asked her to continue with her sinus rinses, but if she continues to have issues, she may need to get back to otolaryngology.

## 2014-03-18 NOTE — Progress Notes (Signed)
   Subjective:    Patient ID: [name], female    DOB: 07-03-1933, 80 y.o.   MRN: [id number]  HPI Patient comes in today for followup of her known asthma and Wegener's granulomatosis. She has done well from a breathing standpoint, and feels that she is at her usual baseline. She has had no chest congestion, but has noticed a lot of sinus pressure and drainage with cough up purulent mucus. She feels is coming from her throat area. She has continued doing her sinus rinses, but doesn't feel this has helped. She has not been seen by her otolaryngologist in quite some time. She is staying compliant on her asthma inhaler.   Review of Systems  Constitutional: Negative for fever and unexpected weight change.  HENT: Positive for congestion and postnasal drip. Negative for dental problem, ear pain, nosebleeds, rhinorrhea, sinus pressure, sneezing, sore throat and trouble swallowing.   Eyes: Negative for redness and itching.  Respiratory: Positive for cough. Negative for chest tightness, shortness of breath and wheezing.   Cardiovascular: Negative for palpitations and leg swelling.  Gastrointestinal: Negative for nausea and vomiting.  Genitourinary: Negative for dysuria.  Musculoskeletal: Negative for joint swelling.  Skin: Negative for rash.  Neurological: Negative for headaches.  Hematological: Does not bruise/bleed easily.  Psychiatric/Behavioral: Negative for dysphoric mood. The patient is not nervous/anxious.        Objective:   Physical Exam Thin female in no acute distress Nose without purulence or discharge noted Neck without lymphadenopathy or thyromegaly Chest with a few rhonchi, but excellent air flow and no wheezing Cardiac exam with regular rate and rhythm, 2/6 systolic murmur Lower extremities with minimal ankle edema, no cyanosis Alert and oriented, moves all 4 extremities.       Assessment & Plan:

## 2014-03-25 ENCOUNTER — Ambulatory Visit: Payer: Medicare Other | Admitting: Pulmonary Disease

## 2014-03-28 ENCOUNTER — Other Ambulatory Visit: Payer: Self-pay | Admitting: Pulmonary Disease

## 2014-04-16 DIAGNOSIS — IMO0002 Reserved for concepts with insufficient information to code with codable children: Secondary | ICD-10-CM | POA: Insufficient documentation

## 2014-06-02 DIAGNOSIS — J189 Pneumonia, unspecified organism: Secondary | ICD-10-CM

## 2014-06-02 HISTORY — DX: Pneumonia, unspecified organism: J18.9

## 2014-06-11 ENCOUNTER — Other Ambulatory Visit (INDEPENDENT_AMBULATORY_CARE_PROVIDER_SITE_OTHER): Payer: Self-pay | Admitting: Otolaryngology

## 2014-06-11 DIAGNOSIS — H903 Sensorineural hearing loss, bilateral: Secondary | ICD-10-CM

## 2014-06-12 ENCOUNTER — Ambulatory Visit
Admission: RE | Admit: 2014-06-12 | Discharge: 2014-06-12 | Disposition: A | Discharge status: Home / Self Care | Payer: 59 | Source: Ambulatory Visit | Attending: Otolaryngology | Admitting: Otolaryngology

## 2014-06-12 DIAGNOSIS — H903 Sensorineural hearing loss, bilateral: Secondary | ICD-10-CM

## 2014-06-17 ENCOUNTER — Other Ambulatory Visit: Payer: Self-pay | Admitting: Otolaryngology

## 2014-07-07 ENCOUNTER — Encounter (HOSPITAL_BASED_OUTPATIENT_CLINIC_OR_DEPARTMENT_OTHER): Payer: Self-pay | Admitting: *Deleted

## 2014-07-07 NOTE — Progress Notes (Signed)
Pt is just getting over pneumonia since 06/24/14-finished antibiotics-meds- Seeing dr Harrington Challenger tomorrow-she does fell better, but want dr Harrington Challenger to know of surgery and did he fell she was ok for general anesthesia-called and left message for his nurse Pt had hernia repair here 8/15-did well-this time she is to stay overnight -to bring all meds-come in 07/13/14 for pt ptt-stopping coumadin if ok dr Harrington Challenger

## 2014-07-10 ENCOUNTER — Emergency Department (HOSPITAL_COMMUNITY): Payer: Medicare Other

## 2014-07-10 ENCOUNTER — Emergency Department (HOSPITAL_COMMUNITY)
Admission: EM | Admit: 2014-07-10 | Discharge: 2014-07-10 | Disposition: A | Discharge status: Home / Self Care | Payer: Medicare Other | Attending: Emergency Medicine | Admitting: Emergency Medicine

## 2014-07-10 ENCOUNTER — Other Ambulatory Visit: Payer: Self-pay

## 2014-07-10 ENCOUNTER — Encounter (HOSPITAL_COMMUNITY): Payer: Self-pay

## 2014-07-10 DIAGNOSIS — Z792 Long term (current) use of antibiotics: Secondary | ICD-10-CM | POA: Diagnosis not present

## 2014-07-10 DIAGNOSIS — M1990 Unspecified osteoarthritis, unspecified site: Secondary | ICD-10-CM | POA: Diagnosis not present

## 2014-07-10 DIAGNOSIS — H9192 Unspecified hearing loss, left ear: Secondary | ICD-10-CM | POA: Diagnosis not present

## 2014-07-10 DIAGNOSIS — Z87891 Personal history of nicotine dependence: Secondary | ICD-10-CM | POA: Diagnosis not present

## 2014-07-10 DIAGNOSIS — Z8709 Personal history of other diseases of the respiratory system: Secondary | ICD-10-CM | POA: Insufficient documentation

## 2014-07-10 DIAGNOSIS — H5440 Blindness, one eye, unspecified eye: Secondary | ICD-10-CM | POA: Insufficient documentation

## 2014-07-10 DIAGNOSIS — H409 Unspecified glaucoma: Secondary | ICD-10-CM | POA: Insufficient documentation

## 2014-07-10 DIAGNOSIS — R531 Weakness: Secondary | ICD-10-CM

## 2014-07-10 DIAGNOSIS — Z86711 Personal history of pulmonary embolism: Secondary | ICD-10-CM | POA: Insufficient documentation

## 2014-07-10 DIAGNOSIS — Z7901 Long term (current) use of anticoagulants: Secondary | ICD-10-CM | POA: Insufficient documentation

## 2014-07-10 DIAGNOSIS — Z79899 Other long term (current) drug therapy: Secondary | ICD-10-CM | POA: Insufficient documentation

## 2014-07-10 DIAGNOSIS — M6281 Muscle weakness (generalized): Secondary | ICD-10-CM | POA: Insufficient documentation

## 2014-07-10 DIAGNOSIS — Z86718 Personal history of other venous thrombosis and embolism: Secondary | ICD-10-CM | POA: Insufficient documentation

## 2014-07-10 DIAGNOSIS — R059 Cough, unspecified: Secondary | ICD-10-CM

## 2014-07-10 DIAGNOSIS — Z8701 Personal history of pneumonia (recurrent): Secondary | ICD-10-CM | POA: Diagnosis not present

## 2014-07-10 DIAGNOSIS — R05 Cough: Secondary | ICD-10-CM

## 2014-07-10 LAB — CBC WITH DIFFERENTIAL/PLATELET
Basophils Absolute: 0 10*3/uL (ref 0.0–0.1)
Basophils Relative: 0 % (ref 0–1)
EOS PCT: 2 % (ref 0–5)
Eosinophils Absolute: 0.1 10*3/uL (ref 0.0–0.7)
HCT: 29.6 % — ABNORMAL LOW (ref 36.0–46.0)
Hemoglobin: 9 g/dL — ABNORMAL LOW (ref 12.0–15.0)
Lymphocytes Relative: 13 % (ref 12–46)
Lymphs Abs: 0.6 10*3/uL — ABNORMAL LOW (ref 0.7–4.0)
MCH: 24.3 pg — AB (ref 26.0–34.0)
MCHC: 30.4 g/dL (ref 30.0–36.0)
MCV: 79.8 fL (ref 78.0–100.0)
MONO ABS: 0.9 10*3/uL (ref 0.1–1.0)
Monocytes Relative: 18 % — ABNORMAL HIGH (ref 3–12)
NEUTROS ABS: 3.4 10*3/uL (ref 1.7–7.7)
NEUTROS PCT: 67 % (ref 43–77)
PLATELETS: 445 10*3/uL — AB (ref 150–400)
RBC: 3.71 MIL/uL — ABNORMAL LOW (ref 3.87–5.11)
RDW: 19.7 % — AB (ref 11.5–15.5)
WBC: 5.1 10*3/uL (ref 4.0–10.5)

## 2014-07-10 LAB — COMPREHENSIVE METABOLIC PANEL
ALT: 15 U/L (ref 0–35)
ANION GAP: 5 (ref 5–15)
AST: 34 U/L (ref 0–37)
Albumin: 3.4 g/dL — ABNORMAL LOW (ref 3.5–5.2)
Alkaline Phosphatase: 109 U/L (ref 39–117)
BUN: 13 mg/dL (ref 6–23)
CO2: 24 mmol/L (ref 19–32)
CREATININE: 0.76 mg/dL (ref 0.50–1.10)
Calcium: 9.3 mg/dL (ref 8.4–10.5)
Chloride: 108 mEq/L (ref 96–112)
GFR, EST NON AFRICAN AMERICAN: 78 mL/min — AB (ref >90)
Glucose, Bld: 95 mg/dL (ref 70–99)
Potassium: 4.2 mmol/L (ref 3.5–5.1)
Sodium: 137 mmol/L (ref 135–145)
TOTAL PROTEIN: 6.7 g/dL (ref 6.0–8.3)
Total Bilirubin: 0.5 mg/dL (ref 0.3–1.2)

## 2014-07-10 LAB — URINALYSIS, ROUTINE W REFLEX MICROSCOPIC
BILIRUBIN URINE: NEGATIVE
GLUCOSE, UA: NEGATIVE mg/dL
Hgb urine dipstick: NEGATIVE
KETONES UR: NEGATIVE mg/dL
Leukocytes, UA: NEGATIVE
NITRITE: NEGATIVE
PROTEIN: NEGATIVE mg/dL
SPECIFIC GRAVITY, URINE: 1.009 (ref 1.005–1.030)
UROBILINOGEN UA: 0.2 mg/dL (ref 0.0–1.0)
pH: 6.5 (ref 5.0–8.0)

## 2014-07-10 LAB — TROPONIN I: Troponin I: <0.03 ng/mL (ref <0.031)

## 2014-07-10 LAB — PROTIME-INR
INR: 1.94 — ABNORMAL HIGH (ref 0.00–1.49)
Prothrombin Time: 22.4 seconds — ABNORMAL HIGH (ref 11.6–15.2)

## 2014-07-10 NOTE — ED Notes (Signed)
Per pt, increased generalized weakness since yesterday.  Feeling fatigued.  Cough.  No change in urination.  No fever.  No dizziness.  Hx of hgb 8.3.  Tired more than usual.  No change in orientation.  No unilateral weakness.  No neuro deficit.

## 2014-07-10 NOTE — ED Provider Notes (Signed)
CSN: [id number]     Arrival date & time 07/10/14  1124 History   First MD Initiated Contact with Patient 07/10/14 1207     Chief Complaint  Patient presents with  . Weakness  . Cough     (Consider location/radiation/quality/duration/timing/severity/associated sxs/prior Treatment) Patient is a 81 y.o. female presenting with weakness.  Weakness This is a recurrent problem. Episode onset: 1 week ago. The problem occurs constantly. The problem has not changed since onset.Pertinent negatives include no chest pain, no abdominal pain, no headaches and no shortness of breath. Nothing aggravates the symptoms. Nothing relieves the symptoms.    Past Medical History  Diagnosis Date  . Wegener's granulomatosis   . OA (osteoarthritis)   . Blindness of one eye   . Hearing loss in left ear     hearing aid both ears  . Bladder incontinence   . Glaucoma   . Clotting disorder   . History of pulmonary embolism 1997  . Shortness of breath dyspnea   . Pneumonia 12/15  . Multiple allergies   . Chronic sinusitis    Past Surgical History  Procedure Laterality Date  . Tubal ligation    . Pubovaginal sling    . Cataract extraction    . Melanoma excision      left leg  . Vena cava filter placement  1997  . Knee arthroscopy  2006    rt   . Inguinal hernia repair Left 02/17/2014    Procedure: LEFT INGUINAL HERNIA REPAIR WITH MESH;  Surgeon: Harl Bowie, MD;  Location: [place];  Service: General;  Laterality: Left;  . Insertion of mesh N/A 02/17/2014    Procedure: INSERTION OF MESH;  Surgeon: Harl Bowie, MD;  Location: [place];  Service: General;  Laterality: N/A;   Family History  Problem Relation Age of Onset  . Heart disease Father     stroke-deceased  . Stroke Father   . Congestive Heart Failure Mother     deceased  . Heart disease Mother   . Uterine cancer Sister   . Lymphoma Sister   . Skin cancer Sister     no melanoma   History   Substance Use Topics  . Smoking status: Former Smoker -- 1.50 packs/day for 30 years    Types: Cigarettes    Quit date: 07/03/1984  . Smokeless tobacco: Not on file  . Alcohol Use: Yes     Comment: 1-2 glasses   OB History    No data available     Review of Systems  Respiratory: Negative for shortness of breath.   Cardiovascular: Negative for chest pain.  Gastrointestinal: Negative for abdominal pain.  Neurological: Positive for weakness. Negative for headaches.  All other systems reviewed and are negative.     Allergies  Adhesive  Home Medications   Prior to Admission medications   Medication Sig Start Date End Date Taking? Authorizing Provider  acetaminophen (TYLENOL) 500 MG tablet Take 1,000 mg by mouth every 6 (six) hours as needed for mild pain (and sleep).   Yes Historical Provider, MD  ADVAIR DISKUS 500-50 MCG/DOSE AEPB INHALE 1 PUFF INTO LUNGS EVERY 12 HOURS   Yes Kathee Delton, MD  b complex vitamins capsule Take 1 capsule by mouth daily.   Yes Historical Provider, MD  calcium-vitamin D (OSCAL WITH D) 500-200 MG-UNIT per tablet Take 1 tablet by mouth daily with breakfast.   Yes Historical Provider, MD  Cholecalciferol (VITAMIN D PO) Take  1 tablet by mouth daily.   Yes Historical Provider, MD  diphenhydramine-acetaminophen (TYLENOL PM) 25-500 MG TABS Take 1 tablet by mouth at bedtime as needed (for pain/sleep).    Yes Historical Provider, MD  iron polysaccharides (NU-IRON) 150 MG capsule Take 150 mg by mouth daily.   Yes Historical Provider, MD  latanoprost (XALATAN) 0.005 % ophthalmic solution Place 1 drop into both eyes at bedtime.  08/23/13  Yes Historical Provider, MD  levofloxacin (LEVAQUIN) 500 MG tablet Take 500 mg by mouth daily. Started taking antibiotic on 12/24, has taken 3 different rounds of this, finish last round yesterday 1/7 06/25/14  Yes Historical Provider, MD  MELATONIN PO Take 3-4 tablets by mouth at bedtime as needed (for sleep).   Yes Historical  Provider, MD  Multiple Vitamins-Minerals (MULTIVITAMIN WITH MINERALS) tablet Take 1 tablet by mouth daily.   Yes Historical Provider, MD  sodium chloride (OCEAN) 0.65 % SOLN nasal spray Place 1 spray into both nostrils as needed for congestion.   Yes Historical Provider, MD  sulfamethoxazole-trimethoprim (BACTRIM DS) 800-160 MG per tablet Take 1 tablet by mouth every Monday, Wednesday, and Friday.    Yes Historical Provider, MD  timolol (BETIMOL) 0.5 % ophthalmic solution Place 1 drop into the right eye daily with breakfast.    Yes Historical Provider, MD  warfarin (COUMADIN) 2.5 MG tablet Take 1.25-2.5 mg by mouth See admin instructions. Takes 2.5mg  everyday except 1.25mg  on Monday, Wednesday, and Friday   Yes Historical Provider, MD  zoledronic acid (RECLAST) 5 MG/100ML SOLN Inject 5 mg into the vein once. yearly   Yes Historical Provider, MD   BP 139/58 mmHg  Pulse 91  Temp(Src) 97.9 F (36.6 C) (Oral)  Resp 17  SpO2 96% Physical Exam  Constitutional: She is oriented to person, place, and time. She appears well-developed and well-nourished.  HENT:  Head: Normocephalic and atraumatic.  Right Ear: External ear normal.  Left Ear: External ear normal.  Eyes: Conjunctivae and EOM are normal. Pupils are equal, round, and reactive to light.  Neck: Normal range of motion. Neck supple.  Cardiovascular: Normal rate, regular rhythm, normal heart sounds and intact distal pulses.   Pulmonary/Chest: Effort normal and breath sounds normal.  Abdominal: Soft. Bowel sounds are normal. There is no tenderness.  Musculoskeletal: Normal range of motion.  Neurological: She is alert and oriented to person, place, and time.  Skin: Skin is warm and dry.  Vitals reviewed.   ED Course  Procedures (including critical care time) Labs Review Labs Reviewed  COMPREHENSIVE METABOLIC PANEL - Abnormal; Notable for the following:    Albumin 3.4 (*)    GFR calc non Af Amer 78 (*)    All other components within  normal limits  CBC WITH DIFFERENTIAL - Abnormal; Notable for the following:    RBC 3.71 (*)    Hemoglobin 9.0 (*)    HCT 29.6 (*)    MCH 24.3 (*)    RDW 19.7 (*)    Platelets 445 (*)    Lymphs Abs 0.6 (*)    Monocytes Relative 18 (*)    All other components within normal limits  PROTIME-INR - Abnormal; Notable for the following:    Prothrombin Time 22.4 (*)    INR 1.94 (*)    All other components within normal limits  URINALYSIS, ROUTINE W REFLEX MICROSCOPIC    Imaging Review Dg Chest 2 View  07/10/2014   CLINICAL DATA:  Cough and congestion .  EXAM: CHEST  2 VIEW  COMPARISON:  07/08/2014.  06/25/2014.  FINDINGS: Mediastinum and hilar structures normal. Stable diffuse bilateral pulmonary interstitial mild prominence noted suggesting chronic interstitial lung disease. Pleural parenchymal thickening consistent with scarring. COPD. Heart size is stable. No pulmonary venous congestion. Scoliosis thoracic spine with diffuse degenerative change.  IMPRESSION: Stable mild interstitial prominence consistent with chronic interstitial lung disease. Pleural parenchymal scarring. COPD. No acute cardiopulmonary disease.   Electronically Signed   By: Marcello Moores  Register   On: 07/10/2014 12:32     EKG Interpretation None      MDM   Final diagnoses:  Cough   81 y.o. female with pertinent PMH of chronic anemia, deafness of L ear, wegners granulomatosis, OA, prior PE on coumadin presents with generalized weakness x 1 week.  Symptoms peaked yesterday and patient stated that she did not want to get out of bed, but stated that she could. She denies frank fever, has had a mild cough. No chest pain or shortness of breath. No GI symptoms. On arrival vital signs and physical exam as above. Workup obtained as above. Negative troponin, labs with anemia, however hemoglobin 8.3 3 days prior to examination.  Chest x-ray unremarkable.  Likely viral syndrome versus other exacerbation of chronic medical conditions. Doubt  PE, ACS given clinical scenario. Patient given strict return precautions, voiced understanding and agreed to follow-up.  I have reviewed all laboratory and imaging studies if ordered as above  1. Generalized weakness   2. Cough         Debby Freiberg, MD 07/11/14 [phone]

## 2014-07-10 NOTE — ED Notes (Signed)
Pt to chest xray at this time

## 2014-07-10 NOTE — Discharge Instructions (Signed)

## 2014-07-10 NOTE — ED Notes (Signed)
Pt attempted to provide urine specimen but voided around hat.

## 2014-07-10 NOTE — ED Notes (Signed)
Patient transported to X-ray 

## 2014-07-13 NOTE — Progress Notes (Signed)
Pt came to [facility] for labwork (pt/ptt) r/t surgery tomorrow. Asked anesthesiologist (Dr. Orene Desanctis) to assess pt since she was adm to ER this weekend and presents with weakness and cough still. Dr. Orene Desanctis spoke with pt's primary MD (Dr. [name]) and after discussion with pt decided she would cancel surgery for tomorrow and reschedule when she feels better. Pt understands she is to follow up with Dr. Harrington Challenger and have Hgb checked and treated if necessary. Will notify Dr. Benjamine Mola of cancel. Labwork not drawn since surgery cancelled. Dr. Orene Desanctis instructed her to restart her coumadin.

## 2014-07-14 ENCOUNTER — Ambulatory Visit (HOSPITAL_BASED_OUTPATIENT_CLINIC_OR_DEPARTMENT_OTHER): Admission: RE | Admit: 2014-07-14 | Payer: Medicare Other | Source: Ambulatory Visit | Admitting: Otolaryngology

## 2014-07-14 HISTORY — DX: Reserved for inherently not codable concepts without codable children: IMO0001

## 2014-07-14 HISTORY — DX: Pneumonia, unspecified organism: J18.9

## 2014-07-14 HISTORY — DX: Allergy status to unspecified drugs, medicaments and biological substances: Z88.9

## 2014-07-14 HISTORY — DX: Chronic sinusitis, unspecified: J32.9

## 2014-07-14 SURGERY — TYMPANOPLASTY, WITH MASTOIDECTOMY
Anesthesia: General | Laterality: Left

## 2014-07-20 ENCOUNTER — Other Ambulatory Visit: Payer: Self-pay

## 2014-07-20 ENCOUNTER — Inpatient Hospital Stay (HOSPITAL_COMMUNITY)
Admission: EM | Admit: 2014-07-20 | Discharge: 2014-07-30 | DRG: 208 | Disposition: A | Discharge status: Skilled Nursing Facility | Payer: Medicare Other | Attending: Internal Medicine | Admitting: Internal Medicine

## 2014-07-20 ENCOUNTER — Encounter (HOSPITAL_COMMUNITY): Payer: Self-pay | Admitting: Emergency Medicine

## 2014-07-20 ENCOUNTER — Emergency Department (HOSPITAL_COMMUNITY): Payer: Medicare Other

## 2014-07-20 DIAGNOSIS — Z6826 Body mass index (BMI) 26.0-26.9, adult: Secondary | ICD-10-CM

## 2014-07-20 DIAGNOSIS — H5440 Blindness, one eye, unspecified eye: Secondary | ICD-10-CM | POA: Diagnosis present

## 2014-07-20 DIAGNOSIS — R0602 Shortness of breath: Secondary | ICD-10-CM | POA: Diagnosis present

## 2014-07-20 DIAGNOSIS — Y95 Nosocomial condition: Secondary | ICD-10-CM | POA: Diagnosis present

## 2014-07-20 DIAGNOSIS — J45909 Unspecified asthma, uncomplicated: Secondary | ICD-10-CM | POA: Diagnosis present

## 2014-07-20 DIAGNOSIS — J324 Chronic pansinusitis: Secondary | ICD-10-CM | POA: Diagnosis present

## 2014-07-20 DIAGNOSIS — D689 Coagulation defect, unspecified: Secondary | ICD-10-CM | POA: Diagnosis present

## 2014-07-20 DIAGNOSIS — T17990A Other foreign object in respiratory tract, part unspecified in causing asphyxiation, initial encounter: Secondary | ICD-10-CM | POA: Diagnosis present

## 2014-07-20 DIAGNOSIS — F419 Anxiety disorder, unspecified: Secondary | ICD-10-CM | POA: Diagnosis present

## 2014-07-20 DIAGNOSIS — Z86711 Personal history of pulmonary embolism: Secondary | ICD-10-CM

## 2014-07-20 DIAGNOSIS — J018 Other acute sinusitis: Secondary | ICD-10-CM | POA: Diagnosis present

## 2014-07-20 DIAGNOSIS — H9192 Unspecified hearing loss, left ear: Secondary | ICD-10-CM | POA: Diagnosis present

## 2014-07-20 DIAGNOSIS — Z8701 Personal history of pneumonia (recurrent): Secondary | ICD-10-CM

## 2014-07-20 DIAGNOSIS — J189 Pneumonia, unspecified organism: Secondary | ICD-10-CM | POA: Diagnosis present

## 2014-07-20 DIAGNOSIS — R739 Hyperglycemia, unspecified: Secondary | ICD-10-CM | POA: Diagnosis present

## 2014-07-20 DIAGNOSIS — D649 Anemia, unspecified: Secondary | ICD-10-CM | POA: Diagnosis present

## 2014-07-20 DIAGNOSIS — T380X5A Adverse effect of glucocorticoids and synthetic analogues, initial encounter: Secondary | ICD-10-CM | POA: Diagnosis present

## 2014-07-20 DIAGNOSIS — Z87891 Personal history of nicotine dependence: Secondary | ICD-10-CM

## 2014-07-20 DIAGNOSIS — E43 Unspecified severe protein-calorie malnutrition: Secondary | ICD-10-CM | POA: Diagnosis present

## 2014-07-20 DIAGNOSIS — Z7901 Long term (current) use of anticoagulants: Secondary | ICD-10-CM | POA: Diagnosis not present

## 2014-07-20 DIAGNOSIS — Z85820 Personal history of malignant melanoma of skin: Secondary | ICD-10-CM | POA: Diagnosis not present

## 2014-07-20 DIAGNOSIS — R0600 Dyspnea, unspecified: Secondary | ICD-10-CM | POA: Diagnosis present

## 2014-07-20 DIAGNOSIS — D899 Disorder involving the immune mechanism, unspecified: Secondary | ICD-10-CM | POA: Diagnosis present

## 2014-07-20 DIAGNOSIS — J9601 Acute respiratory failure with hypoxia: Secondary | ICD-10-CM | POA: Diagnosis present

## 2014-07-20 DIAGNOSIS — Z86718 Personal history of other venous thrombosis and embolism: Secondary | ICD-10-CM

## 2014-07-20 DIAGNOSIS — I82612 Acute embolism and thrombosis of superficial veins of left upper extremity: Secondary | ICD-10-CM | POA: Diagnosis present

## 2014-07-20 DIAGNOSIS — J9600 Acute respiratory failure, unspecified whether with hypoxia or hypercapnia: Secondary | ICD-10-CM

## 2014-07-20 DIAGNOSIS — D500 Iron deficiency anemia secondary to blood loss (chronic): Secondary | ICD-10-CM

## 2014-07-20 DIAGNOSIS — H409 Unspecified glaucoma: Secondary | ICD-10-CM | POA: Diagnosis present

## 2014-07-20 DIAGNOSIS — B9562 Methicillin resistant Staphylococcus aureus infection as the cause of diseases classified elsewhere: Secondary | ICD-10-CM | POA: Diagnosis present

## 2014-07-20 DIAGNOSIS — M3130 Wegener's granulomatosis without renal involvement: Secondary | ICD-10-CM | POA: Diagnosis present

## 2014-07-20 LAB — CBC
HCT: 26.7 % — ABNORMAL LOW (ref 36.0–46.0)
HEMATOCRIT: 27 % — AB (ref 36.0–46.0)
HEMOGLOBIN: 8.3 g/dL — AB (ref 12.0–15.0)
Hemoglobin: 8.3 g/dL — ABNORMAL LOW (ref 12.0–15.0)
MCH: 24.1 pg — ABNORMAL LOW (ref 26.0–34.0)
MCH: 24.7 pg — AB (ref 26.0–34.0)
MCHC: 30.7 g/dL (ref 30.0–36.0)
MCHC: 31.1 g/dL (ref 30.0–36.0)
MCV: 78.5 fL (ref 78.0–100.0)
MCV: 79.5 fL (ref 78.0–100.0)
Platelets: 487 10*3/uL — ABNORMAL HIGH (ref 150–400)
Platelets: 469 10*3/uL — ABNORMAL HIGH (ref 150–400)
RBC: 3.44 MIL/uL — ABNORMAL LOW (ref 3.87–5.11)
RBC: 3.36 MIL/uL — ABNORMAL LOW (ref 3.87–5.11)
RDW: 20 % — ABNORMAL HIGH (ref 11.5–15.5)
RDW: 20.1 % — AB (ref 11.5–15.5)
WBC: 5.4 10*3/uL (ref 4.0–10.5)
WBC: 4.9 10*3/uL (ref 4.0–10.5)

## 2014-07-20 LAB — HEPATIC FUNCTION PANEL
ALT: 12 U/L (ref 0–35)
AST: 22 U/L (ref 0–37)
Albumin: 2.8 g/dL — ABNORMAL LOW (ref 3.5–5.2)
Alkaline Phosphatase: 100 U/L (ref 39–117)
Bilirubin, Direct: <0.1 mg/dL (ref 0.0–0.3)
Total Bilirubin: 0.4 mg/dL (ref 0.3–1.2)
Total Protein: 5.7 g/dL — ABNORMAL LOW (ref 6.0–8.3)

## 2014-07-20 LAB — URINALYSIS, ROUTINE W REFLEX MICROSCOPIC
BILIRUBIN URINE: NEGATIVE
Glucose, UA: NEGATIVE mg/dL
Hgb urine dipstick: NEGATIVE
Ketones, ur: NEGATIVE mg/dL
Leukocytes, UA: NEGATIVE
Nitrite: NEGATIVE
PH: 6 (ref 5.0–8.0)
Protein, ur: NEGATIVE mg/dL
SPECIFIC GRAVITY, URINE: 1.012 (ref 1.005–1.030)
Urobilinogen, UA: 0.2 mg/dL (ref 0.0–1.0)

## 2014-07-20 LAB — CREATININE, SERUM
CREATININE: 0.64 mg/dL (ref 0.50–1.10)
GFR, EST NON AFRICAN AMERICAN: 83 mL/min — AB (ref >90)

## 2014-07-20 LAB — I-STAT CG4 LACTIC ACID, ED: LACTIC ACID, VENOUS: 1.63 mmol/L (ref 0.5–2.2)

## 2014-07-20 LAB — BASIC METABOLIC PANEL
ANION GAP: 6 (ref 5–15)
BUN: 10 mg/dL (ref 6–23)
CO2: 24 mmol/L (ref 19–32)
Calcium: 8.9 mg/dL (ref 8.4–10.5)
Chloride: 105 mEq/L (ref 96–112)
Creatinine, Ser: 0.62 mg/dL (ref 0.50–1.10)
GFR calc Af Amer: >90 mL/min (ref >90)
GFR, EST NON AFRICAN AMERICAN: 84 mL/min — AB (ref >90)
GLUCOSE: 121 mg/dL — AB (ref 70–99)
Potassium: 3.9 mmol/L (ref 3.5–5.1)
SODIUM: 135 mmol/L (ref 135–145)

## 2014-07-20 LAB — BRAIN NATRIURETIC PEPTIDE: B Natriuretic Peptide: 210.5 pg/mL — ABNORMAL HIGH (ref 0.0–100.0)

## 2014-07-20 LAB — PROTIME-INR
INR: 2.14 — ABNORMAL HIGH (ref 0.00–1.49)
PROTHROMBIN TIME: 24.1 s — AB (ref 11.6–15.2)

## 2014-07-20 LAB — MAGNESIUM: MAGNESIUM: 2 mg/dL (ref 1.5–2.5)

## 2014-07-20 LAB — TROPONIN I
Troponin I: 0.04 ng/mL — ABNORMAL HIGH (ref <0.031)
Troponin I: 0.2 ng/mL — ABNORMAL HIGH (ref <0.031)
Troponin I: 0.22 ng/mL — ABNORMAL HIGH (ref <0.031)

## 2014-07-20 LAB — SEDIMENTATION RATE: SED RATE: 40 mm/h — AB (ref 0–22)

## 2014-07-20 MED ORDER — ACETAMINOPHEN 325 MG PO TABS
650.0000 mg | ORAL_TABLET | Freq: Four times a day (QID) | ORAL | Status: DC | PRN
  Administered 2014-07-20 – 2014-07-29 (×7): 650 mg via ORAL
  Filled 2014-07-20 (×7): qty 2

## 2014-07-20 MED ORDER — SALINE SPRAY 0.65 % NA SOLN
1.0000 | NASAL | Status: DC | PRN
  Administered 2014-07-20: 1 via NASAL
  Filled 2014-07-20: qty 44

## 2014-07-20 MED ORDER — VANCOMYCIN HCL IN DEXTROSE 1-5 GM/200ML-% IV SOLN
1000.0000 mg | Freq: Once | INTRAVENOUS | Status: AC
  Administered 2014-07-20: 1000 mg via INTRAVENOUS
  Filled 2014-07-20: qty 200

## 2014-07-20 MED ORDER — WARFARIN SODIUM 2.5 MG PO TABS: 2.5000 mg | ORAL_TABLET | ORAL | Status: DC

## 2014-07-20 MED ORDER — ACETAMINOPHEN 650 MG RE SUPP: 650.0000 mg | Freq: Four times a day (QID) | RECTAL | Status: DC | PRN

## 2014-07-20 MED ORDER — PIPERACILLIN-TAZOBACTAM 3.375 G IVPB
3.3750 g | Freq: Three times a day (TID) | INTRAVENOUS | Status: DC
  Administered 2014-07-20 – 2014-07-24 (×10): 3.375 g via INTRAVENOUS
  Filled 2014-07-20 (×12): qty 50

## 2014-07-20 MED ORDER — WARFARIN SODIUM 2.5 MG PO TABS
3.7500 mg | ORAL_TABLET | Freq: Once | ORAL | Status: AC
  Administered 2014-07-20: 3.75 mg via ORAL
  Filled 2014-07-20: qty 1

## 2014-07-20 MED ORDER — PIPERACILLIN-TAZOBACTAM 3.375 G IVPB
3.3750 g | Freq: Once | INTRAVENOUS | Status: AC
  Administered 2014-07-20: 3.375 g via INTRAVENOUS
  Filled 2014-07-20: qty 50

## 2014-07-20 MED ORDER — LEVALBUTEROL HCL 0.63 MG/3ML IN NEBU
0.6300 mg | INHALATION_SOLUTION | Freq: Four times a day (QID) | RESPIRATORY_TRACT | Status: DC | PRN
  Administered 2014-07-21: 0.63 mg via RESPIRATORY_TRACT
  Filled 2014-07-20 (×2): qty 3

## 2014-07-20 MED ORDER — VANCOMYCIN HCL 500 MG IV SOLR
500.0000 mg | Freq: Two times a day (BID) | INTRAVENOUS | Status: DC
  Administered 2014-07-21 – 2014-07-23 (×5): 500 mg via INTRAVENOUS
  Filled 2014-07-20 (×6): qty 500

## 2014-07-20 MED ORDER — ONDANSETRON HCL 4 MG/2ML IJ SOLN: 4.0000 mg | Freq: Four times a day (QID) | INTRAMUSCULAR | Status: DC | PRN

## 2014-07-20 MED ORDER — SODIUM CHLORIDE 0.9 % IJ SOLN
3.0000 mL | Freq: Two times a day (BID) | INTRAMUSCULAR | Status: DC
  Administered 2014-07-21 – 2014-07-22 (×3): 3 mL via INTRAVENOUS
  Administered 2014-07-23: 10 mL via INTRAVENOUS
  Administered 2014-07-23 – 2014-07-26 (×4): 3 mL via INTRAVENOUS

## 2014-07-20 MED ORDER — ACETAMINOPHEN 325 MG PO TABS: 650.0000 mg | ORAL_TABLET | Freq: Four times a day (QID) | ORAL | Status: DC | PRN

## 2014-07-20 MED ORDER — DIPHENHYDRAMINE HCL 25 MG PO CAPS
25.0000 mg | ORAL_CAPSULE | Freq: Once | ORAL | Status: AC
  Administered 2014-07-20: 25 mg via ORAL
  Filled 2014-07-20: qty 1

## 2014-07-20 MED ORDER — ONDANSETRON HCL 4 MG PO TABS
4.0000 mg | ORAL_TABLET | Freq: Four times a day (QID) | ORAL | Status: DC | PRN
Start: 2014-07-20 — End: 2014-07-30

## 2014-07-20 MED ORDER — MOMETASONE FURO-FORMOTEROL FUM 200-5 MCG/ACT IN AERO
2.0000 | INHALATION_SPRAY | Freq: Two times a day (BID) | RESPIRATORY_TRACT | Status: DC
  Administered 2014-07-20 – 2014-07-22 (×4): 2 via RESPIRATORY_TRACT
  Filled 2014-07-20: qty 8.8

## 2014-07-20 MED ORDER — LEVALBUTEROL HCL 1.25 MG/0.5ML IN NEBU
1.2500 mg | INHALATION_SOLUTION | Freq: Three times a day (TID) | RESPIRATORY_TRACT | Status: DC
  Administered 2014-07-21 – 2014-07-30 (×27): 1.25 mg via RESPIRATORY_TRACT
  Filled 2014-07-20 (×33): qty 0.5

## 2014-07-20 MED ORDER — SODIUM CHLORIDE 0.9 % IV SOLN
INTRAVENOUS | Status: DC
  Administered 2014-07-20 (×2): via INTRAVENOUS

## 2014-07-20 MED ORDER — WARFARIN - PHARMACIST DOSING INPATIENT: Freq: Every day | Status: DC

## 2014-07-20 MED ORDER — LIP MEDEX EX OINT
TOPICAL_OINTMENT | CUTANEOUS | Status: DC | PRN
  Administered 2014-07-20: 22:00:00 via TOPICAL
  Filled 2014-07-20 (×2): qty 7

## 2014-07-20 MED ORDER — LEVALBUTEROL HCL 1.25 MG/0.5ML IN NEBU
1.2500 mg | INHALATION_SOLUTION | Freq: Once | RESPIRATORY_TRACT | Status: AC
  Administered 2014-07-20: 1.25 mg via RESPIRATORY_TRACT
  Filled 2014-07-20: qty 0.5

## 2014-07-20 MED ORDER — SODIUM CHLORIDE 0.9 % IV BOLUS (SEPSIS)
500.0000 mL | Freq: Once | INTRAVENOUS | Status: AC
  Administered 2014-07-20: 500 mL via INTRAVENOUS

## 2014-07-20 MED ORDER — ENOXAPARIN SODIUM 40 MG/0.4ML ~~LOC~~ SOLN: 40.0000 mg | SUBCUTANEOUS | Status: DC

## 2014-07-20 NOTE — H&P (Addendum)
[facility] History and Physical  [name]:[id number] DOB: 1932/12/20 DOA: 07/20/2014  Referring physician:   PCP:  [name], MD   Chief Complaint: Shortness of Breath HPI:  81 year old female from SNF, history of Wegener's granulomatosis, asthma who presents to the ER because of worsening shortness of breath, fever 103. The patient has not been feeling well for a couple of months gradually worsening over the last 3-4 weeks. The patient states that 2 months ago she was diagnosed with anemia, hemoglobin significantly lower than baseline. She did not have any extensive workup other than been provided with a referral to see [name]. This consult has not taken place as of yet. She denies any hematochezia, melena. She describes 15 pound weight loss in the last 2-3 years.  She subsequently fell ill Christmas Eve, so her PCP office and he placed her on levofloxacin for 7 days for suspected pneumonia. Patient was subsequently seen at urgent care on 1/8 for weakness and persistent cough rx sx . In the ED the patient was found to have a normal white count of 5.4. Chest x-ray shows emphysema with increased interstitial markings.      Review of Systems: negative for the following  Constitutional: Denies fever, chills, diaphoresis, appetite change and fatigue.  HEENT: Denies photophobia, eye pain, redness, hearing loss, ear pain, congestion, sore throat, rhinorrhea, sneezing, mouth sores, trouble swallowing, neck pain, neck stiffness and tinnitus.  Respiratory: Positive for SOB, DOE, cough, chest tightness, and wheezing.  Cardiovascular: Denies chest pain, palpitations and leg swelling.  Gastrointestinal: Denies nausea, vomiting, abdominal pain, diarrhea, constipation, blood in stool and abdominal distention.  Genitourinary: Denies dysuria, urgency, frequency, hematuria, flank pain and difficulty urinating.  Musculoskeletal: Denies myalgias, back pain, joint swelling, arthralgias and  gait problem.  Skin: Denies pallor, rash and wound.  Neurological: Denies dizziness, seizures, syncope, weakness, light-headedness, numbness and headaches.  Hematological: Denies adenopathy. Easy bruising, personal or family bleeding history  Psychiatric/Behavioral: Denies suicidal ideation, mood changes, confusion, nervousness, sleep disturbance and agitation       Past Medical History  Diagnosis Date  . Wegener's granulomatosis   . OA (osteoarthritis)   . Blindness of one eye   . Hearing loss in left ear     hearing aid both ears  . Bladder incontinence   . Glaucoma   . Clotting disorder   . History of pulmonary embolism 1997  . Shortness of breath dyspnea   . Pneumonia 12/15  . Multiple allergies   . Chronic sinusitis      Past Surgical History  Procedure Laterality Date  . Tubal ligation    . Pubovaginal sling    . Cataract extraction    . Melanoma excision      left leg  . Vena cava filter placement  1997  . Knee arthroscopy  2006    rt   . Inguinal hernia repair Left 02/17/2014    Procedure: LEFT INGUINAL HERNIA REPAIR WITH MESH;  Surgeon: Harl Bowie, MD;  Location: [place];  Service: General;  Laterality: Left;  . Insertion of mesh N/A 02/17/2014    Procedure: INSERTION OF MESH;  Surgeon: Harl Bowie, MD;  Location: [place];  Service: General;  Laterality: N/A;      Social History:  reports that she quit smoking about 30 years ago. Her smoking use included Cigarettes. She has a 45 pack-year smoking history. She does not have any smokeless tobacco history on file. She reports  that she drinks alcohol. She reports that she does not use illicit drugs.    Allergies  Allergen Reactions  . Adhesive [Tape] Rash    Family History  Problem Relation Age of Onset  . Heart disease Father     stroke-deceased  . Stroke Father   . Congestive Heart Failure Mother     deceased  . Heart disease Mother   . Uterine  cancer Sister   . Lymphoma Sister   . Skin cancer Sister     no melanoma     Prior to Admission medications   Medication Sig Start Date End Date Taking? Authorizing Provider  ADVAIR DISKUS 500-50 MCG/DOSE AEPB INHALE 1 PUFF INTO LUNGS EVERY 12 HOURS   Yes Kathee Delton, MD  b complex vitamins capsule Take 1 capsule by mouth daily.   Yes Historical Provider, MD  calcium-vitamin D (OSCAL WITH D) 500-200 MG-UNIT per tablet Take 1 tablet by mouth daily with breakfast.   Yes Historical Provider, MD  Cholecalciferol (VITAMIN D PO) Take 1 tablet by mouth daily.   Yes Historical Provider, MD  diphenhydramine-acetaminophen (TYLENOL PM) 25-500 MG TABS Take 1 tablet by mouth at bedtime.   Yes Historical Provider, MD  furosemide (LASIX) 20 MG tablet Take 20 mg by mouth daily.   Yes Historical Provider, MD  MELATONIN PO Take 1-3 tablets by mouth at bedtime as needed (for sleep).    Yes Historical Provider, MD  Multiple Vitamins-Minerals (MULTIVITAMIN WITH MINERALS) tablet Take 1 tablet by mouth daily.   Yes Historical Provider, MD  timolol (BETIMOL) 0.5 % ophthalmic solution Place 1 drop into the right eye daily with breakfast.    Yes Historical Provider, MD  warfarin (COUMADIN) 2.5 MG tablet Take 2.5-3.75 mg by mouth See admin instructions. Takes 2.42m everyday except 3.75 on Monday, Wednesday, and Friday   Yes Historical Provider, MD  zoledronic acid (RECLAST) 5 MG/100ML SOLN Inject 5 mg into the vein once. yearly    Historical Provider, MD     Physical Exam: Filed Vitals:   07/20/14 1307 07/20/14 1402 07/20/14 1528 07/20/14 1623  BP: 115/97  105/53 132/94  Pulse: 130  95 111  Temp: 98.2 F (36.8 C) 99.2 F (37.3 C)    TempSrc: Oral Rectal    Resp: 28  16 24   SpO2: 95%  100% 100%     Constitutional: Vital signs reviewed. Patient chronically ill-appearing, in no acute distress and cooperative with exam. Alert and oriented x3.  Head: Normocephalic and atraumatic  Ear: TM normal bilaterally   Mouth: no erythema or exudates, MMM  Eyes: PERRL, EOMI, conjunctivae normal, No scleral icterus.  Neck: Supple, Trachea midline normal ROM, No JVD, mass, thyromegaly, or carotid bruit present.  Cardiovascular: RRR, S1 normal, S2 normal, no MRG, pulses symmetric and intact bilaterally  Pulmonary/Chest: Mild wheezing, more in the upper airway, rales, or rhonchi  Abdominal: Soft. Non-tender, non-distended, bowel sounds are normal, no masses, organomegaly, or guarding present.  GU: no CVA tenderness Musculoskeletal: No joint deformities, erythema, or stiffness, ROM full and no nontender Ext: no edema and no cyanosis, pulses palpable bilaterally (DP and PT)  Hematology: no cervical, inginal, or axillary adenopathy.  Neurological: A&O x3, Strenght is normal and symmetric bilaterally, cranial nerve II-XII are grossly intact, no focal motor deficit, sensory intact to light touch bilaterally.  Skin: Warm, dry and intact. No rash, cyanosis, or clubbing.  Psychiatric: Normal mood and affect. speech and behavior is normal. Judgment and thought content normal. Cognition and  memory are normal.       Labs on Admission:    Basic Metabolic Panel:  Recent Labs Lab 07/20/14 1320  NA 135  K 3.9  CL 105  CO2 24  GLUCOSE 121*  BUN 10  CREATININE 0.62  CALCIUM 8.9   Liver Function Tests: No results for input(s): AST, ALT, ALKPHOS, BILITOT, PROT, ALBUMIN in the last 168 hours. No results for input(s): LIPASE, AMYLASE in the last 168 hours. No results for input(s): AMMONIA in the last 168 hours. CBC:  Recent Labs Lab 07/20/14 1320  WBC 5.4  HGB 8.3*  HCT 27.0*  MCV 78.5  PLT 487*   Cardiac Enzymes:  Recent Labs Lab 07/20/14 1320  TROPONINI 0.04*    BNP (last 3 results) No results for input(s): PROBNP in the last 8760 hours.    CBG: No results for input(s): GLUCAP in the last 168 hours.  Radiological Exams on Admission: Dg Chest 2 View (if Patient Has Fever And/or  Copd)  07/20/2014   CLINICAL DATA:  Cough and shortness of breath for 1 month.  EXAM: CHEST  2 VIEW  COMPARISON:  Chest x-rays dated 07/10/2014 and 07/08/2014 and 06/25/2014 and chest CT dated 09/24/2013  FINDINGS: The heart size and pulmonary vascularity are normal. There are multiple areas of scarring in both lungs, particularly in the right upper lobe and left lung base. There is a small area of scarring laterally at the left lung base as well as posterior medially. There is a tiny right pleural effusion, unchanged since 06/25/2014 but new since 09/24/2013.  The markings are slightly more accentuated at the bases than on the prior study.  No acute osseous abnormalities. There are emphysematous changes bilaterally.  IMPRESSION: Emphysema with scarring in both lungs. Slight increased accentuation of the interstitial markings could represent superimposed bronchitis. Persistent small right pleural effusion.   Electronically Signed   By: Rozetta Nunnery M.D.   On: 07/20/2014 14:17    EKG: Independently reviewed.    Assessment/Plan Principal Problem:   HCAP (healthcare-associated pneumonia) Active Problems:   Dyspnea    Shortness of breath Likely secondary to mild URI vs asthma exacerbation vs HCAP  Given the fact that patient is immunocompromised and has a high-grade fever, will obtain blood cultures Stat the patient on broad-spectrum antibiotics vancomycin and Zosyn She has a history of aspergillosis Discussed with Dr. Megan Salon on the phone, infectious disease Will order aspergillosis  galactomann assay No indication for any other treatment at this time Cont nebs ,advair   Normocytic anemia Patient is on Coumadin, INR pending Patient had a colonoscopy in August 2010 that showed internal hemorrhoids and a few diverticula We'll check FOBT Anemia panel PT/INR pending  History of DVT Patient has an IVC filter in place  Wegener's granulomatosis May need pulmonary consult in am  Son in  law concerned about exacerbation Will check ESR ,CRP   Code Status:   full Family Communication: bedside Disposition Plan: admit   Time spent: 70 mins   [facility] Pager [phone]  If 7PM-7AM, please contact night-coverage [email or web address] Password [facility] 07/20/2014, 4:36 PM

## 2014-07-20 NOTE — Progress Notes (Signed)
Clinical Social Work Department BRIEF PSYCHOSOCIAL ASSESSMENT 07/20/2014   Patient:  [name], [name]     Account Number:  [id number]     Admit date:  07/20/2014  Clinical Social Worker:  Tilda Burrow, CLINICAL SOCIAL WORKER  Date/Time:  07/20/2014 11:08 AM  Referred by:  CSW  Date Referred:  07/20/2014 Referred for  SNF Placement   Other Referral:   Interview type:  Patient Other interview type:    PSYCHOSOCIAL DATA Living Status:  FACILITY Admitted from facility:  [facility] Level of care:  Independent Living Primary support name:  Marchelle Folks (Emergency Contact) [phone] Primary support relationship to patient:   Degree of support available:   Patient currently lives at [facility] [place], where she in the independent living unit with her husband. Patient has 2 emergency contacts listed on her facecheet.    Emergency Contacts: Daughter/Sandra Renelda Loma [phone]                                     Carren Rang [phone]    CURRENT CONCERNS Current Concerns  Adjustment to Illness   Other Concerns:    SOCIAL WORK ASSESSMENT / PLAN CSW met with patient at bedside. There was no family present. Nurse was present at bedside. Patient appeared to be extrmely hard of hearing during assessment. Patient confirms that she comes from [facility] [place]. Patient lives in the independent unit with her husband.     Patient also confirms that she presents to the [facility] today because of coughing and SOB.    Patient states " I couldn't breath". Patient informed CSW that she feels better than when she initially came into the ED. Patient states that she can do her ADL's independently.    Per notes, patient has a history of Wegener's granulomatosis , asthma who presents to the ER because of worsening shortness of breath, fever 103. The patient has not been feeling well for a couple of months gradually worsening over the last 3-4 weeks. The patient states  that 2 months ago she was diagnosed with anemia, hemoglobin significantly lower than baseline.    Willette Brace [phone] ED CSW 07/20/2014 5:27 PM   Assessment/plan status:  Referral to [employer] Other assessment/ plan:   Information/referral to community resources:    PATIENT'S/FAMILY'S RESPONSE TO PLAN OF CARE: There was no family present during [place] interview. However, nurse from [facility] did call on behalf of patient's husband. Patient appears to have a good relationship with her husband.

## 2014-07-20 NOTE — Progress Notes (Signed)
UR completed 

## 2014-07-20 NOTE — Progress Notes (Signed)
CSW met with patient at bedside. There was no family present. Nurse was present at bedside. Patient appeared to be hard of hearing. Patient confirms that she comes from [facility] [place]. Patient also confirms that she presents to the [facility] today because of coughing and SOB.   Patient states " I couldn't breath". Patient informed CSW that she feels better than when she initially came into the ED. Patient states that she can do her ADL's independently.   Willette Brace [phone] ED CSW 07/20/2014 5:27 PM

## 2014-07-20 NOTE — Progress Notes (Signed)
ANTIBIOTIC CONSULT NOTE - INITIAL  Pharmacy Consult for Vancomycin / Zosyn Indication: HCAP  Allergies  Allergen Reactions  . Adhesive [Tape] Rash    Patient Measurements:   Adjusted Body Weight:   Vital Signs: Temp: 99.2 F (37.3 C) (01/18 1402) Temp Source: Rectal (01/18 1402) BP: 105/53 mmHg (01/18 1528) Pulse Rate: 95 (01/18 1528) Intake/Output from previous day:   Intake/Output from this shift:    Labs:  Recent Labs  07/20/14 1320  WBC 5.4  HGB 8.3*  PLT 487*  CREATININE 0.62   Estimated Creatinine Clearance: 47.1 mL/min (by C-G formula based on Cr of 0.62). No results for input(s): VANCOTROUGH, VANCOPEAK, VANCORANDOM, GENTTROUGH, GENTPEAK, GENTRANDOM, TOBRATROUGH, TOBRAPEAK, TOBRARND, AMIKACINPEAK, AMIKACINTROU, AMIKACIN in the last 72 hours.   Microbiology: No results found for this or any previous visit (from the past 720 hour(s)).  Medical History: Past Medical History  Diagnosis Date  . Wegener's granulomatosis   . OA (osteoarthritis)   . Blindness of one eye   . Hearing loss in left ear     hearing aid both ears  . Bladder incontinence   . Glaucoma   . Clotting disorder   . History of pulmonary embolism 1997  . Shortness of breath dyspnea   . Pneumonia 12/15  . Multiple allergies   . Chronic sinusitis    Assessment: 72 yoF presents to [facility] with SOB, fever, and worsening productive cough x 1 month.  Pharmacy consulted to start vancomycin and zosyn for empiric HCAP.  No antibiotic allergies noted. First doses given in ED.   1/18 >> Vancomycin   >> 1/18 >> Zosyn >>    Tmax: 99.2 (103 at home per pt) WBCs: WNL Renal: SCr 0.62, CrCl 47 (N82)  1/18 blood: collected 1/18 urine: collected  1/18 influenza panel: ordered  Goal of Therapy:  Vancomycin trough level 15-20 mcg/ml  Eradication of infection  Plan:  Zosyn 3.375g IV q8h (infuse over 4 hours) Vancomycin 500mg  IV q12h F/u renal fxn, cultures, vancomycin trough as warranted,  clinical course  Ralene Bathe, PharmD, BCPS 07/20/2014, 4:21 PM  Pager: [phone]

## 2014-07-20 NOTE — ED Provider Notes (Signed)
CSN: [id number]     Arrival date & time 07/20/14  1259 History   First MD Initiated Contact with Patient 07/20/14 1317     Chief Complaint  Patient presents with  . Shortness of Breath     (Consider location/radiation/quality/duration/timing/severity/associated sxs/prior Treatment) HPI Comments: Patient here complaining of worsening shortness of breath 24 hours. Notes increased productive cough 1 month but today worse. EMS was called and patient had a temperature of 103 and was given Tylenol. She denies any vomiting or diarrhea. No anginal type chest pain. No lower extremity swelling. Patient placed on oxygen and transported here.  Patient is a 81 y.o. female presenting with shortness of breath. The history is provided by the patient.  Shortness of Breath   Past Medical History  Diagnosis Date  . Wegener's granulomatosis   . OA (osteoarthritis)   . Blindness of one eye   . Hearing loss in left ear     hearing aid both ears  . Bladder incontinence   . Glaucoma   . Clotting disorder   . History of pulmonary embolism 1997  . Shortness of breath dyspnea   . Pneumonia 12/15  . Multiple allergies   . Chronic sinusitis    Past Surgical History  Procedure Laterality Date  . Tubal ligation    . Pubovaginal sling    . Cataract extraction    . Melanoma excision      left leg  . Vena cava filter placement  1997  . Knee arthroscopy  2006    rt   . Inguinal hernia repair Left 02/17/2014    Procedure: LEFT INGUINAL HERNIA REPAIR WITH MESH;  Surgeon: Harl Bowie, MD;  Location: [place];  Service: General;  Laterality: Left;  . Insertion of mesh N/A 02/17/2014    Procedure: INSERTION OF MESH;  Surgeon: Harl Bowie, MD;  Location: [place];  Service: General;  Laterality: N/A;   Family History  Problem Relation Age of Onset  . Heart disease Father     stroke-deceased  . Stroke Father   . Congestive Heart Failure Mother     deceased   . Heart disease Mother   . Uterine cancer Sister   . Lymphoma Sister   . Skin cancer Sister     no melanoma   History  Substance Use Topics  . Smoking status: Former Smoker -- 1.50 packs/day for 30 years    Types: Cigarettes    Quit date: 07/03/1984  . Smokeless tobacco: Not on file  . Alcohol Use: Yes     Comment: 1-2 glasses   OB History    No data available     Review of Systems  Respiratory: Positive for shortness of breath.   All other systems reviewed and are negative.     Allergies  Adhesive  Home Medications   Prior to Admission medications   Medication Sig Start Date End Date Taking? Authorizing Provider  ADVAIR DISKUS 500-50 MCG/DOSE AEPB INHALE 1 PUFF INTO LUNGS EVERY 12 HOURS   Yes Kathee Delton, MD  calcium-vitamin D (OSCAL WITH D) 500-200 MG-UNIT per tablet Take 1 tablet by mouth daily with breakfast.   Yes Historical Provider, MD  Cholecalciferol (VITAMIN D PO) Take 1 tablet by mouth daily.   Yes Historical Provider, MD  diphenhydramine-acetaminophen (TYLENOL PM) 25-500 MG TABS Take 1 tablet by mouth at bedtime.   Yes Historical Provider, MD  furosemide (LASIX) 20 MG tablet Take 20 mg by mouth  daily.   Yes Historical Provider, MD  MELATONIN PO Take 3-4 tablets by mouth at bedtime as needed (for sleep).   Yes Historical Provider, MD  Multiple Vitamins-Minerals (MULTIVITAMIN WITH MINERALS) tablet Take 1 tablet by mouth daily.   Yes Historical Provider, MD  predniSONE (DELTASONE) 20 MG tablet Take 20 mg by mouth 2 (two) times daily with a meal.   Yes Historical Provider, MD  timolol (BETIMOL) 0.5 % ophthalmic solution Place 1 drop into the right eye daily with breakfast.    Yes Historical Provider, MD  traZODone (DESYREL) 50 MG tablet Take 50 mg by mouth at bedtime.   Yes Historical Provider, MD  b complex vitamins capsule Take 1 capsule by mouth daily.    Historical Provider, MD  warfarin (COUMADIN) 2.5 MG tablet Take 1.25-2.5 mg by mouth See admin  instructions. Takes 2.5mg  everyday except 1.25mg  on Monday, Wednesday, and Friday    Historical Provider, MD  zoledronic acid (RECLAST) 5 MG/100ML SOLN Inject 5 mg into the vein once. yearly    Historical Provider, MD   BP 115/97 mmHg  Pulse 130  Temp(Src) 99.2 F (37.3 C) (Rectal)  Resp 28  SpO2 95% Physical Exam  Constitutional: She is oriented to person, place, and time. She appears well-developed and well-nourished.  Non-toxic appearance. No distress.  HENT:  Head: Normocephalic and atraumatic.  Eyes: Conjunctivae, EOM and lids are normal. Pupils are equal, round, and reactive to light.  Neck: Normal range of motion. Neck supple. No tracheal deviation present. No thyroid mass present.  Cardiovascular: Normal rate, regular rhythm and normal heart sounds.  Exam reveals no gallop.   No murmur heard. Pulmonary/Chest: Effort normal. No stridor. No respiratory distress. She has decreased breath sounds. She has no wheezes. She has no rhonchi. She has no rales.  Abdominal: Soft. Normal appearance and bowel sounds are normal. She exhibits no distension. There is no tenderness. There is no rebound and no CVA tenderness.  Musculoskeletal: Normal range of motion. She exhibits no edema or tenderness.  Neurological: She is alert and oriented to person, place, and time. She has normal strength. No cranial nerve deficit or sensory deficit. GCS eye subscore is 4. GCS verbal subscore is 5. GCS motor subscore is 6.  Skin: Skin is warm and dry. No abrasion and no rash noted.  Psychiatric: She has a normal mood and affect. Her speech is normal and behavior is normal.  Nursing note and vitals reviewed.   ED Course  Procedures (including critical care time) Labs Review Labs Reviewed  CBC - Abnormal; Notable for the following:    RBC 3.44 (*)    Hemoglobin 8.3 (*)    HCT 27.0 (*)    MCH 24.1 (*)    RDW 20.0 (*)    Platelets 487 (*)    All other components within normal limits  CULTURE, BLOOD  (ROUTINE X 2)  CULTURE, BLOOD (ROUTINE X 2)  URINE CULTURE  URINALYSIS, ROUTINE W REFLEX MICROSCOPIC  BASIC METABOLIC PANEL  TROPONIN I  I-STAT CG4 LACTIC ACID, ED    Imaging Review No results found.   EKG Interpretation None      MDM   Final diagnoses:  None    Patient started on antibiotics for empiric treatment of pneumonia. No evidence of sepsis at this time. She is mentating well. Slight troponin elevation noted. She has no active chest pain. Discuss with hospitalist and patient will be admitted    Leota Jacobsen, MD 07/20/14 [phone]

## 2014-07-20 NOTE — Progress Notes (Signed)
ANTICOAGULATION CONSULT NOTE - Initial Consult  Pharmacy Consult for Warfarin Indication: Hx PE  Allergies  Allergen Reactions  . Adhesive [Tape] Rash    Patient Measurements:   Heparin Dosing Weight:   Vital Signs: Temp: 99.2 F (37.3 C) (01/18 1402) Temp Source: Rectal (01/18 1402) BP: 132/94 mmHg (01/18 1623) Pulse Rate: 111 (01/18 1623)  Labs:  Recent Labs  07/20/14 1320  HGB 8.3*  HCT 27.0*  PLT 487*  CREATININE 0.62  TROPONINI 0.04*    Estimated Creatinine Clearance: 47.1 mL/min (by C-G formula based on Cr of 0.62).   Medical History: Past Medical History  Diagnosis Date  . Wegener's granulomatosis   . OA (osteoarthritis)   . Blindness of one eye   . Hearing loss in left ear     hearing aid both ears  . Bladder incontinence   . Glaucoma   . Clotting disorder   . History of pulmonary embolism 1997  . Shortness of breath dyspnea   . Pneumonia 12/15  . Multiple allergies   . Chronic sinusitis    Assessment: 22 yoF presents to [facility] with SOB, fever, and worsening productive cough x 1 month. Note PMHx of PE on chronic warfarin anticoagulation.  Pharmacy consulted to resume warfarin inpatient.    Home dose warfarin: 2.5mg  daily except 3.75mg  on M/W/F, LD 1/17  1/18: INR = 2.14, therapeutic CBC: Hgb low 8.3, plts elevated 487 No DDI noted  Goal of Therapy:  INR 2-3 Monitor platelets by anticoagulation protocol: Yes   Plan:  Warfarin 3.75mg  po x 1 tonight Daily PT/INR  Ralene Bathe, PharmD, BCPS 07/20/2014, 7:55 PM  Pager: [phone]

## 2014-07-20 NOTE — ED Notes (Signed)
Bed: WA02 Expected date:  Expected time:  Means of arrival:  Comments: EMS, SOB, Fever 103

## 2014-07-20 NOTE — ED Notes (Signed)
Per EMS pt from [facility] with complaint of cough and SOB for a month. Pt was given 5 mg of albuterol and 1 g tylenol en route with EMS.

## 2014-07-21 ENCOUNTER — Inpatient Hospital Stay (HOSPITAL_COMMUNITY): Payer: Medicare Other

## 2014-07-21 ENCOUNTER — Encounter (HOSPITAL_COMMUNITY): Payer: Self-pay | Admitting: Radiology

## 2014-07-21 DIAGNOSIS — M3130 Wegener's granulomatosis without renal involvement: Secondary | ICD-10-CM

## 2014-07-21 DIAGNOSIS — R0600 Dyspnea, unspecified: Secondary | ICD-10-CM

## 2014-07-21 DIAGNOSIS — J189 Pneumonia, unspecified organism: Principal | ICD-10-CM

## 2014-07-21 LAB — COMPREHENSIVE METABOLIC PANEL
ALT: 11 U/L (ref 0–35)
AST: 24 U/L (ref 0–37)
Albumin: 2.5 g/dL — ABNORMAL LOW (ref 3.5–5.2)
Alkaline Phosphatase: 94 U/L (ref 39–117)
Anion gap: 7 (ref 5–15)
BUN: 6 mg/dL (ref 6–23)
CO2: 25 mmol/L (ref 19–32)
CREATININE: 0.67 mg/dL (ref 0.50–1.10)
Calcium: 8.8 mg/dL (ref 8.4–10.5)
Chloride: 108 mEq/L (ref 96–112)
GFR calc Af Amer: >90 mL/min (ref >90)
GFR, EST NON AFRICAN AMERICAN: 81 mL/min — AB (ref >90)
GLUCOSE: 105 mg/dL — AB (ref 70–99)
Potassium: 3.7 mmol/L (ref 3.5–5.1)
Sodium: 140 mmol/L (ref 135–145)
Total Bilirubin: 0.4 mg/dL (ref 0.3–1.2)
Total Protein: 5.4 g/dL — ABNORMAL LOW (ref 6.0–8.3)

## 2014-07-21 LAB — CBC
HCT: 25.6 % — ABNORMAL LOW (ref 36.0–46.0)
HEMOGLOBIN: 7.8 g/dL — AB (ref 12.0–15.0)
MCH: 24 pg — ABNORMAL LOW (ref 26.0–34.0)
MCHC: 30.5 g/dL (ref 30.0–36.0)
MCV: 78.8 fL (ref 78.0–100.0)
Platelets: 478 10*3/uL — ABNORMAL HIGH (ref 150–400)
RBC: 3.25 MIL/uL — ABNORMAL LOW (ref 3.87–5.11)
RDW: 20.1 % — AB (ref 11.5–15.5)
WBC: 4.6 10*3/uL (ref 4.0–10.5)

## 2014-07-21 LAB — INFLUENZA PANEL BY PCR (TYPE A & B)
H1N1FLUPCR: NOT DETECTED
Influenza A By PCR: NEGATIVE
Influenza B By PCR: NEGATIVE

## 2014-07-21 LAB — URINE CULTURE
COLONY COUNT: NO GROWTH
Culture: NO GROWTH

## 2014-07-21 LAB — OCCULT BLOOD X 1 CARD TO LAB, STOOL: FECAL OCCULT BLD: NEGATIVE

## 2014-07-21 LAB — PROTIME-INR
INR: 2.15 — ABNORMAL HIGH (ref 0.00–1.49)
Prothrombin Time: 24.2 seconds — ABNORMAL HIGH (ref 11.6–15.2)

## 2014-07-21 LAB — TROPONIN I: TROPONIN I: 0.17 ng/mL — AB (ref <0.031)

## 2014-07-21 LAB — C-REACTIVE PROTEIN: CRP: 4.9 mg/dL — ABNORMAL HIGH (ref <0.60)

## 2014-07-21 MED ORDER — DIPHENHYDRAMINE HCL 25 MG PO CAPS
25.0000 mg | ORAL_CAPSULE | Freq: Once | ORAL | Status: AC
  Administered 2014-07-21: 25 mg via ORAL
  Filled 2014-07-21: qty 1

## 2014-07-21 MED ORDER — ENSURE COMPLETE PO LIQD: 237.0000 mL | Freq: Two times a day (BID) | ORAL | Status: DC | PRN

## 2014-07-21 MED ORDER — METHYLPREDNISOLONE SODIUM SUCC 125 MG IJ SOLR
80.0000 mg | Freq: Two times a day (BID) | INTRAMUSCULAR | Status: DC
  Administered 2014-07-21 – 2014-07-24 (×8): 80 mg via INTRAVENOUS
  Filled 2014-07-21 (×4): qty 2
  Filled 2014-07-21 (×2): qty 1.28
  Filled 2014-07-21: qty 2
  Filled 2014-07-21: qty 1.28

## 2014-07-21 MED ORDER — SALINE SPRAY 0.65 % NA SOLN: 2.0000 | NASAL | Status: DC | PRN

## 2014-07-21 MED ORDER — IOHEXOL 300 MG/ML  SOLN
80.0000 mL | Freq: Once | INTRAMUSCULAR | Status: AC | PRN
  Administered 2014-07-21: 80 mL via INTRAVENOUS

## 2014-07-21 MED ORDER — WARFARIN SODIUM 2.5 MG PO TABS
2.5000 mg | ORAL_TABLET | Freq: Once | ORAL | Status: AC
  Administered 2014-07-21: 2.5 mg via ORAL
  Filled 2014-07-21: qty 1

## 2014-07-21 NOTE — Progress Notes (Addendum)
[facility] PROGRESS NOTE  BRESLYN ABDO ZOX:[id number] DOB: [birth year] DOA: 07/20/2014 PCP:  [name], MD  Assessment/Plan: Principal Problem:   HCAP (healthcare-associated pneumonia) Active Problems:   Dyspnea     Shortness of breath Likely secondary to mild URI vs asthma exacerbation vs HCAP vs exacerbation of eczema granulomatosis Given the fact that patient is immunocompromised and has a high-grade fever, will obtain blood cultures Continue broad-spectrum antibiotics vancomycin and Zosyn She has a history of aspergillosis Discussed with Dr. Megan Salon on the phone, infectious disease Follow-up aspergillosis galactomann assay Patient wheezing this morning therefore started the patient on IV steroids, KVO fluids Repeat chest x-ray Cont nebs ,advair   Normocytic anemia Patient is on Coumadin, INR therapaeutic  Patient had a colonoscopy in August 2012  that showed internal hemorrhoids and a few diverticula We'll check FOBT, if positive may need outpatient gastroenterology evaluation Anemia panel PT/INR  Doubt that the patient can have inpatient workup given her pulmonary symptoms Will transfuse for hemoglobin less than 7.0   History of DVT Patient has an IVC filter in place Continue Coumadin  Wegener's granulomatosis Pulmonary consult,Dr Pennie Banter, may need CT imaging, will order CT with contrast  Conveyed results of ESR ,CRP TO rheumatology  According to the patient's rheumatologist [name], patient not a candidate for rituximab at this point, due to concerns about underlying infection    Code Status: full Family Communication: DISCUSSED WITH Karn Pickler [phone], son in law Disposition Plan:  As above    Brief narrative: 81 year old female from SNF, history of Wegener's granulomatosis followed by [name], asthma who presents to the ER because of worsening shortness of breath, fever 103. The patient has not been feeling well for a couple  of months gradually worsening over the last 3-4 weeks. The patient states that 2 months ago she was diagnosed with anemia, hemoglobin significantly lower than baseline. She did not have any extensive workup other than been provided with a referral to see [name]. This consult has not taken place as of yet. She denies any hematochezia, melena. She describes 15 pound weight loss in the last 2-3 years. She subsequently fell ill Christmas Eve, so her PCP office and he placed her on levofloxacin for 7 days for suspected pneumonia. Patient was subsequently seen at urgent care on 1/8 for weakness and persistent cough rx sx . In the ED the patient was found to have a normal white count of 5.4. Chest x-ray shows emphysema with increased interstitial markings.  Consultants:  PULMONARY  Procedures:  None   Antibiotics: Zosyn,vanc   HPI/Subjective: Breathing is worse today   Objective: Filed Vitals:   07/20/14 2149 07/20/14 2209 07/21/14 0453 07/21/14 0857  BP: 137/59  131/65   Pulse: 100  102   Temp: 98.6 F (37 C)  99.2 F (37.3 C)   TempSrc: Oral  Oral   Resp: 20  20   Height:      Weight:      SpO2: 100% 100% 97% 100%    Intake/Output Summary (Last 24 hours) at 07/21/14 1014 Last data filed at 07/21/14 0753  Gross per 24 hour  Intake 1183.34 ml  Output      0 ml  Net 1183.34 ml    Exam:  General: alert & oriented x 3 In NAD  Cardiovascular: RRR, nl S1 s2  Respiratory: Decreased breath sounds at the bases, scattered rhonchi, no crackles  Abdomen: soft +BS NT/ND, no masses palpable  Extremities: No cyanosis and  no edema      Data Reviewed: Basic Metabolic Panel:  Recent Labs Lab 07/20/14 1320 07/20/14 1612 07/20/14 1713 07/21/14 0350  NA 135  --   --  140  K 3.9  --   --  3.7  CL 105  --   --  108  CO2 24  --   --  25  GLUCOSE 121*  --   --  105*  BUN 10  --   --  6  CREATININE 0.62 0.64  --  0.67  CALCIUM 8.9  --   --  8.8  MG  --   --  2.0  --      Liver Function Tests:  Recent Labs Lab 07/20/14 1713 07/21/14 0350  AST 22 24  ALT 12 11  ALKPHOS 100 94  BILITOT 0.4 0.4  PROT 5.7* 5.4*  ALBUMIN 2.8* 2.5*   No results for input(s): LIPASE, AMYLASE in the last 168 hours. No results for input(s): AMMONIA in the last 168 hours.  CBC:  Recent Labs Lab 07/20/14 1320 07/20/14 1713 07/21/14 0350  WBC 5.4 4.9 4.6  HGB 8.3* 8.3* 7.8*  HCT 27.0* 26.7* 25.6*  MCV 78.5 79.5 78.8  PLT 487* 469* 478*    Cardiac Enzymes:  Recent Labs Lab 07/20/14 1320 07/20/14 1612 07/20/14 1613 07/21/14 0350  TROPONINI 0.04* 0.20* 0.22* 0.17*   BNP (last 3 results) No results for input(s): PROBNP in the last 8760 hours.   CBG: No results for input(s): GLUCAP in the last 168 hours.  Recent Results (from the past 240 hour(s))  Culture, blood (routine x 2)     Status: None (Preliminary result)   Collection Time: 07/20/14  1:20 PM  Result Value Ref Range Status   Specimen Description BLOOD RIGHT ANTECUBITAL  Final   Special Requests BOTTLES DRAWN AEROBIC AND ANAEROBIC 3ML  Final   Culture   Final           BLOOD CULTURE RECEIVED NO GROWTH TO DATE CULTURE WILL BE HELD FOR 5 DAYS BEFORE ISSUING A FINAL NEGATIVE REPORT Performed at [facility]    Report Status PENDING  Incomplete  Culture, blood (routine x 2)     Status: None (Preliminary result)   Collection Time: 07/20/14  1:22 PM  Result Value Ref Range Status   Specimen Description BLOOD RIGHT FOREARM  Final   Special Requests BOTTLES DRAWN AEROBIC AND ANAEROBIC 3ML  Final   Culture   Final           BLOOD CULTURE RECEIVED NO GROWTH TO DATE CULTURE WILL BE HELD FOR 5 DAYS BEFORE ISSUING A FINAL NEGATIVE REPORT Performed at [facility]    Report Status PENDING  Incomplete     Studies: Dg Chest 2 View (if Patient Has Fever And/or Copd)  07/20/2014   CLINICAL DATA:  Cough and shortness of breath for 1 month.  EXAM: CHEST  2 VIEW  COMPARISON:  Chest x-rays  dated 07/10/2014 and 07/08/2014 and 06/25/2014 and chest CT dated 09/24/2013  FINDINGS: The heart size and pulmonary vascularity are normal. There are multiple areas of scarring in both lungs, particularly in the right upper lobe and left lung base. There is a small area of scarring laterally at the left lung base as well as posterior medially. There is a tiny right pleural effusion, unchanged since 06/25/2014 but new since 09/24/2013.  The markings are slightly more accentuated at the bases than on the prior study.  No  acute osseous abnormalities. There are emphysematous changes bilaterally.  IMPRESSION: Emphysema with scarring in both lungs. Slight increased accentuation of the interstitial markings could represent superimposed bronchitis. Persistent small right pleural effusion.   Electronically Signed   By: Rozetta Nunnery M.D.   On: 07/20/2014 14:17   Dg Chest 2 View  07/10/2014   CLINICAL DATA:  Cough and congestion .  EXAM: CHEST  2 VIEW  COMPARISON:  07/08/2014.  06/25/2014.  FINDINGS: Mediastinum and hilar structures normal. Stable diffuse bilateral pulmonary interstitial mild prominence noted suggesting chronic interstitial lung disease. Pleural parenchymal thickening consistent with scarring. COPD. Heart size is stable. No pulmonary venous congestion. Scoliosis thoracic spine with diffuse degenerative change.  IMPRESSION: Stable mild interstitial prominence consistent with chronic interstitial lung disease. Pleural parenchymal scarring. COPD. No acute cardiopulmonary disease.   Electronically Signed   By: Marcello Moores  Register   On: 07/10/2014 12:32    Scheduled Meds: . levalbuterol  1.25 mg Nebulization 3 times per day  . methylPREDNISolone (SOLU-MEDROL) injection  80 mg Intravenous Q12H  . mometasone-formoterol  2 puff Inhalation BID  . piperacillin-tazobactam (ZOSYN)  IV  3.375 g Intravenous Q8H  . sodium chloride  3 mL Intravenous Q12H  . vancomycin  500 mg Intravenous Q12H  . warfarin  2.5 mg  Oral ONCE-1800  . Warfarin - Pharmacist Dosing Inpatient   Does not apply q1800   Continuous Infusions:   Principal Problem:   HCAP (healthcare-associated pneumonia) Active Problems:   Dyspnea    Time spent: 40 minutes   [facility] Pager [phone]. If 7PM-7AM, please contact night-coverage at [email or web address], password [facility] 07/21/2014, 10:14 AM  LOS: 1 day

## 2014-07-21 NOTE — Progress Notes (Addendum)
[facility] for Warfarin Indication: Hx PE  Allergies  Allergen Reactions  . Adhesive [Tape] Rash    Patient Measurements: Height: 5\' 2"  (157.5 cm) Weight: 125 lb (56.7 kg) IBW/kg (Calculated) : 50.1 Heparin Dosing Weight:   Vital Signs: Temp: 99.2 F (37.3 C) (01/19 0453) Temp Source: Oral (01/19 0453) BP: 131/65 mmHg (01/19 0453) Pulse Rate: 102 (01/19 0453)  Labs:  Recent Labs  07/20/14 1320 07/20/14 1612 07/20/14 1613 07/20/14 1713 07/21/14 0350  HGB 8.3*  --   --  8.3* 7.8*  HCT 27.0*  --   --  26.7* 25.6*  PLT 487*  --   --  469* 478*  LABPROT  --   --   --  24.1* 24.2*  INR  --   --   --  2.14* 2.15*  CREATININE 0.62 0.64  --   --  0.67  TROPONINI 0.04* 0.20* 0.22*  --  0.17*    Estimated Creatinine Clearance: 45.1 mL/min (by C-G formula based on Cr of 0.67).  Assessment: 53 yoF presents [facility] with SOB, fever, and worsening productive cough x 1 month. Note PMHx of PE on chronic warfarin anticoagulation.  Pharmacy consulted to resume warfarin inpatient.    Home dose warfarin: 2.5mg  daily except 3.75mg  on M/W/F, LD 1/17  Today, 07/21/2014  INR = 2.15, therapeutic  CBC: Hgb decreased to 7.8 following admission, plts elevated  Discussed with [facility], checking FOBT, OK to continue warfarin for now  Diet: regular  Drug interactions: steroids and antibiotics make reduce warfarin needs  Goal of Therapy:  INR 2-3 Monitor platelets by anticoagulation protocol: Yes   Plan:   Warfarin 2.5mg  po x 1 tonight as per home dosing  Daily PT/INR  Monitor for bleeding  Doreene Eland, PharmD, BCPS.   Pager: [phone] 07/21/2014 8:56 AM

## 2014-07-21 NOTE — Consult Note (Signed)
PULMONARY / CRITICAL CARE MEDICINE   Name: [name] MRN: [id number] DOB: 1933-01-12    ADMISSION DATE:  07/20/2014 CONSULTATION DATE:  07/21/2014  REFERRING MD :  Allyson Sabal  CHIEF COMPLAINT:  Dyspnea, cough  INITIAL PRESENTATION: 81 y/o female patient of [name] with with granulomatous polyangitis and asthma was admitted on 1/19 with dyspnea, fever, wheeze.  STUDIES:    SIGNIFICANT EVENTS:    HISTORY OF PRESENT ILLNESS:  This is a pleasant 81 y/o female with granulomatous polyangitis was admitted on 1/19 with dyspnea, cough and fever.  She states that the symptoms have been persistent since 06/2014 and have not responded to multiple rounds of antibiotics.  She denies headache but notes a lot of sinus congestion and bloody mucus from her nose.  She has coughed up scant, clear mucus which will rarely have some mucus present.  She denies joint aches, body aches, or a rash.  No nausea, vomiting, or diarrhea.  Of note, she has recently developed an anemia of uncertain etiology and her methotrexate was stopped about 6 months ago, she thinks because of the anemia.  Her primary rheumatologist [name] treated her with prednisone recently for this.  PAST MEDICAL HISTORY :   has a past medical history of Wegener's granulomatosis; OA (osteoarthritis); Blindness of one eye; Hearing loss in left ear; Bladder incontinence; Glaucoma; Clotting disorder; History of pulmonary embolism (1997); Shortness of breath dyspnea; Pneumonia (12/15); Multiple allergies; and Chronic sinusitis.  has past surgical history that includes Tubal ligation; Pubovaginal sling; Cataract extraction; Melanoma excision; Vena cava filter placement (1997); Knee arthroscopy (2006); Inguinal hernia repair (Left, 02/17/2014); and Insertion of mesh (N/A, 02/17/2014). Prior to Admission medications   Medication Sig Start Date End Date Taking? Authorizing Provider  ADVAIR DISKUS 500-50 MCG/DOSE AEPB INHALE 1 PUFF INTO LUNGS EVERY 12  HOURS   Yes Kathee Delton, MD  b complex vitamins capsule Take 1 capsule by mouth daily.   Yes Historical Provider, MD  calcium-vitamin D (OSCAL WITH D) 500-200 MG-UNIT per tablet Take 1 tablet by mouth daily with breakfast.   Yes Historical Provider, MD  Cholecalciferol (VITAMIN D PO) Take 1 tablet by mouth daily.   Yes Historical Provider, MD  diphenhydramine-acetaminophen (TYLENOL PM) 25-500 MG TABS Take 1 tablet by mouth at bedtime.   Yes Historical Provider, MD  furosemide (LASIX) 20 MG tablet Take 20 mg by mouth daily.   Yes Historical Provider, MD  MELATONIN PO Take 1-3 tablets by mouth at bedtime as needed (for sleep).    Yes Historical Provider, MD  Multiple Vitamins-Minerals (MULTIVITAMIN WITH MINERALS) tablet Take 1 tablet by mouth daily.   Yes Historical Provider, MD  timolol (BETIMOL) 0.5 % ophthalmic solution Place 1 drop into the right eye daily with breakfast.    Yes Historical Provider, MD  warfarin (COUMADIN) 2.5 MG tablet Take 2.5-3.75 mg by mouth See admin instructions. Takes 2.5mg  everyday except 3.75 on Monday, Wednesday, and Friday   Yes Historical Provider, MD  zoledronic acid (RECLAST) 5 MG/100ML SOLN Inject 5 mg into the vein once. yearly    Historical Provider, MD   Allergies  Allergen Reactions  . Adhesive [Tape] Rash    FAMILY HISTORY:  indicated that her mother is deceased. She indicated that her father is deceased.  SOCIAL HISTORY:  reports that she quit smoking about 30 years ago. Her smoking use included Cigarettes. She has a 45 pack-year smoking history. She does not have any smokeless tobacco history on file. She reports  that she drinks alcohol. She reports that she does not use illicit drugs.  REVIEW OF SYSTEMS:   Gen: Notes fever, denies chills, weight change, fatigue, night sweats HEENT: Denies blurred vision, double vision, hearing loss, tinnitus, notes sinus congestion, some rhinorrhea, denies sore throat, neck stiffness, dysphagia PULM: per HPI CV:  Denies chest pain, edema, orthopnea, paroxysmal nocturnal dyspnea, palpitations GI: Denies abdominal pain, nausea, vomiting, diarrhea, hematochezia, melena, constipation, change in bowel habits GU: Denies dysuria, hematuria, polyuria, oliguria, urethral discharge Endocrine: Denies hot or cold intolerance, polyuria, polyphagia or appetite change Derm: Denies rash, dry skin, scaling or peeling skin change Heme: Denies easy bruising, bleeding, bleeding gums Neuro: Denies headache, numbness, weakness, slurred speech, loss of memory or consciousness   SUBJECTIVE:   VITAL SIGNS: Temp:  [98.2 F (36.8 C)-99.2 F (37.3 C)] 99.2 F (37.3 C) (01/19 0453) Pulse Rate:  [94-130] 102 (01/19 0453) Resp:  [16-28] 20 (01/19 0453) BP: (105-137)/(53-97) 131/65 mmHg (01/19 0453) SpO2:  [95 %-100 %] 100 % (01/19 0857) Weight:  [56.7 kg (125 lb)] 56.7 kg (125 lb) (01/18 1700) HEMODYNAMICS:   VENTILATOR SETTINGS:   INTAKE / OUTPUT:  Intake/Output Summary (Last 24 hours) at 07/21/14 1021 Last data filed at 07/21/14 0753  Gross per 24 hour  Intake 1183.34 ml  Output      0 ml  Net 1183.34 ml    PHYSICAL EXAMINATION: General:  Mild distress, cough Neuro:  Awake and alert, no acute distress HEENT:  NCAT, EOMi Cardiovascular:  RRR, no mgr Lungs:  Loud upper airway wheeze heard througout Abdomen:  Soft, nontender Musculoskeletal:  Normal bulk and tone Skin:  Decreased turgor, some bruising, no rash  LABS:  CBC  Recent Labs Lab 07/20/14 1320 07/20/14 1713 07/21/14 0350  WBC 5.4 4.9 4.6  HGB 8.3* 8.3* 7.8*  HCT 27.0* 26.7* 25.6*  PLT 487* 469* 478*   Coag's  Recent Labs Lab 07/20/14 1713 07/21/14 0350  INR 2.14* 2.15*   BMET  Recent Labs Lab 07/20/14 1320 07/20/14 1612 07/21/14 0350  NA 135  --  140  K 3.9  --  3.7  CL 105  --  108  CO2 24  --  25  BUN 10  --  6  CREATININE 0.62 0.64 0.67  GLUCOSE 121*  --  105*   Electrolytes  Recent Labs Lab 07/20/14 1320  07/20/14 1713 07/21/14 0350  CALCIUM 8.9  --  8.8  MG  --  2.0  --    Sepsis Markers  Recent Labs Lab 07/20/14 1334  LATICACIDVEN 1.63   ABG No results for input(s): PHART, PCO2ART, PO2ART in the last 168 hours. Liver Enzymes  Recent Labs Lab 07/20/14 1713 07/21/14 0350  AST 22 24  ALT 12 11  ALKPHOS 100 94  BILITOT 0.4 0.4  ALBUMIN 2.8* 2.5*   Cardiac Enzymes  Recent Labs Lab 07/20/14 1612 07/20/14 1613 07/21/14 0350  TROPONINI 0.20* 0.22* 0.17*   Glucose No results for input(s): GLUCAP in the last 168 hours.  Imaging Dg Chest 2 View (if Patient Has Fever And/or Copd)  07/20/2014   CLINICAL DATA:  Cough and shortness of breath for 1 month.  EXAM: CHEST  2 VIEW  COMPARISON:  Chest x-rays dated 07/10/2014 and 07/08/2014 and 06/25/2014 and chest CT dated 09/24/2013  FINDINGS: The heart size and pulmonary vascularity are normal. There are multiple areas of scarring in both lungs, particularly in the right upper lobe and left lung base. There is a small area of  scarring laterally at the left lung base as well as posterior medially. There is a tiny right pleural effusion, unchanged since 06/25/2014 but new since 09/24/2013.  The markings are slightly more accentuated at the bases than on the prior study.  No acute osseous abnormalities. There are emphysematous changes bilaterally.  IMPRESSION: Emphysema with scarring in both lungs. Slight increased accentuation of the interstitial markings could represent superimposed bronchitis. Persistent small right pleural effusion.   Electronically Signed   By: Rozetta Nunnery M.D.   On: 07/20/2014 14:17     ASSESSMENT / PLAN:  Impression: #1 acute respiratory failure with hypoxemia #2 scant hemoptysis #3 known polyangiitis granulomatosis #4 sinusitis #5 anemia  PULMONARY A: Subacute cough with fever, loud upper airway wheeze and sinus symptoms in setting of know small vessel vasculitis.  The differential is broad an includes  ongoing sinus infections or a new acute viral illness but I am most concerned about the possibility of recurrent polyangitis granulomatosis.  Specifically, I am concerned that she may be developing upper airway stenosis again considering the loud wheeze heard on exam. She has history of airway stenosis requiring dilation in the past. Though she has scant hemoptysis, her normal chest x-ray would suggest that there is not diffuse parenchymal pulmonary involvement at this time. Asthma, doubt exacerbation P:   Would continue antibiotics for now I will obtain a high-resolution CT scan of the chest to look at pulmonary parenchyma and her airways May need bronchoscopy for further evaluation Continue steroids at current dosing Continue delay and as needed Xopenex   HEMATOLOGIC A:  Anemia, source uncertain P:  Agree with workup for GI bleeding as you are doing May need hematology involvement  Thanks for this interesting consult.  Have updated her son-in-law Phill Myron (pediatric pulmonologist in [place] at [place]).  Roselie Awkward, MD [facility] Pager: [phone] Cell: [phone] If no response, call [phone]   07/21/2014, 10:21 AM

## 2014-07-21 NOTE — Progress Notes (Signed)
Patient's flu PCR negative.  Droplet precautions d/c. Patient and family informed.

## 2014-07-21 NOTE — Care Management Note (Signed)
    Page 1 of 1   07/21/2014     1:48:40 PM CARE MANAGEMENT NOTE 07/21/2014  Patient:  [name], [name]   Account Number:  [id number]  Date Initiated:  07/21/2014  Documentation initiated by:  Dessa Phi  Subjective/Objective Assessment:   81 y/o f admitted w/pna.     Action/Plan:   From [facility] [place].   Anticipated DC Date:  07/23/2014   Anticipated DC Plan:  [place]  CM consult      Choice offered to / List presented to:             Status of service:  In process, will continue to follow Medicare Important Message given?   (If response is "NO", the following Medicare IM given date fields will be blank) Date Medicare IM given:   Medicare IM given by:   Date Additional Medicare IM given:   Additional Medicare IM given by:    Discharge Disposition:    Per UR Regulation:  Reviewed for med. necessity/level of care/duration of stay  If discussed at [place] of Stay Meetings, dates discussed:    Comments:  07/21/14 Dessa Phi RN BSN NCM [phone] If home 02 needed can arrange if qualifies within 48hrs prior to d/c, & documented in progress notes.

## 2014-07-21 NOTE — Progress Notes (Signed)
INITIAL NUTRITION ASSESSMENT  DOCUMENTATION CODES Per approved criteria  -Not Applicable   INTERVENTION: -Recommend Ensure Complete PRN -RD to continue to monitor  NUTRITION DIAGNOSIS: Inadequate oral intake related to decreased appetite as evidenced by PO intake <75% for 10 days.   Goal: Pt to meet >/= 90% of their estimated nutrition needs    Monitor:  Total protein/energy intake, labs, weights, supplement needs  Reason for Assessment: MST  81 y.o. female  Admitting Dx: HCAP (healthcare-associated pneumonia)  ASSESSMENT: 81 year old female from SNF, history of Wegener's granulomatosis, asthma who presents to the ER because of worsening shortness of breath, fever 103  -Pt reported poor PO intake for past 10 days d/t feelings of weakness and overall illness -Diet recall indicates pt consuming three meals daily, with <50% meal consumption -Endorsed weight loss of 5-10 lbs, however attributed this to Lasix -Appetite is currently improving during admit, consumed~75% of meals -Has not tried Ensure or Boost, but was willing to trial supplements as needed -CBG slightly elevated; however pt receiving methylprednisone -No sign of muscle wasting or fat loss noted on nutrition focused physical exam    Height: Ht Readings from Last 1 Encounters:  07/20/14 5\' 2"  (1.575 m)    Weight: Wt Readings from Last 1 Encounters:  07/20/14 125 lb (56.7 kg)    Ideal Body Weight: 110 lb  % Ideal Body Weight: 114%  Wt Readings from Last 10 Encounters:  07/20/14 125 lb (56.7 kg)  07/07/14 130 lb (58.968 kg)  03/18/14 128 lb 3.2 oz (58.151 kg)  02/17/14 128 lb 8 oz (58.287 kg)  01/07/14 126 lb 3.2 oz (57.244 kg)  11/05/13 126 lb 6.4 oz (57.335 kg)  09/19/13 126 lb 3.2 oz (57.244 kg)  02/11/13 127 lb 9.6 oz (57.879 kg)  08/07/12 123 lb 3.2 oz (55.883 kg)  08/02/09 139 lb 8 oz (63.277 kg)    Usual Body Weight: 130-135 lb  % Usual Body Weight: 96%  BMI:  Body mass index is  22.86 kg/(m^2).  Estimated Nutritional Needs: Kcal: 1450-1650 Protein: 55-65 ram Fluid: >/=1500 ml daily  Skin: WDL  Diet Order: Diet regular  EDUCATION NEEDS: -No education needs identified at this time   Intake/Output Summary (Last 24 hours) at 07/21/14 1137 Last data filed at 07/21/14 0753  Gross per 24 hour  Intake 1183.34 ml  Output      0 ml  Net 1183.34 ml    Last BM: 1/18   Labs:   Recent Labs Lab 07/20/14 1320 07/20/14 1612 07/20/14 1713 07/21/14 0350  NA 135  --   --  140  K 3.9  --   --  3.7  CL 105  --   --  108  CO2 24  --   --  25  BUN 10  --   --  6  CREATININE 0.62 0.64  --  0.67  CALCIUM 8.9  --   --  8.8  MG  --   --  2.0  --   GLUCOSE 121*  --   --  105*    CBG (last 3)  No results for input(s): GLUCAP in the last 72 hours.  Scheduled Meds: . levalbuterol  1.25 mg Nebulization 3 times per day  . methylPREDNISolone (SOLU-MEDROL) injection  80 mg Intravenous Q12H  . mometasone-formoterol  2 puff Inhalation BID  . piperacillin-tazobactam (ZOSYN)  IV  3.375 g Intravenous Q8H  . sodium chloride  3 mL Intravenous Q12H  . vancomycin  500 mg Intravenous  Q12H  . warfarin  2.5 mg Oral ONCE-1800  . Warfarin - Pharmacist Dosing Inpatient   Does not apply q1800    Continuous Infusions:   Past Medical History  Diagnosis Date  . Wegener's granulomatosis   . OA (osteoarthritis)   . Blindness of one eye   . Hearing loss in left ear     hearing aid both ears  . Bladder incontinence   . Glaucoma   . Clotting disorder   . History of pulmonary embolism 1997  . Shortness of breath dyspnea   . Pneumonia 12/15  . Multiple allergies   . Chronic sinusitis     Past Surgical History  Procedure Laterality Date  . Tubal ligation    . Pubovaginal sling    . Cataract extraction    . Melanoma excision      left leg  . Vena cava filter placement  1997  . Knee arthroscopy  2006    rt   . Inguinal hernia repair Left 02/17/2014    Procedure: LEFT  INGUINAL HERNIA REPAIR WITH MESH;  Surgeon: Harl Bowie, MD;  Location: [facility] [place];  Service: General;  Laterality: Left;  . Insertion of mesh N/A 02/17/2014    Procedure: INSERTION OF MESH;  Surgeon: Harl Bowie, MD;  Location: [facility];  Service: General;  Laterality: N/A;    Atlee Abide MS RD LDN Clinical Dietitian [phone]

## 2014-07-21 NOTE — Progress Notes (Signed)
Patient admitted from [facility] (ph#: [phone]) and plan is to return to [facility]. RNCM, Juliann Pulse aware & will arrange home health services & DME at discharge if needed.   No further CSW needs identified - CSW signing off.   Raynaldo Opitz, [facility] Clinical Social Worker cell #: [phone]

## 2014-07-22 ENCOUNTER — Encounter (HOSPITAL_COMMUNITY): Payer: Self-pay | Admitting: Radiology

## 2014-07-22 ENCOUNTER — Inpatient Hospital Stay (HOSPITAL_COMMUNITY): Payer: Medicare Other

## 2014-07-22 ENCOUNTER — Inpatient Hospital Stay (HOSPITAL_COMMUNITY): Payer: Medicare Other | Admitting: Anesthesiology

## 2014-07-22 DIAGNOSIS — J9601 Acute respiratory failure with hypoxia: Secondary | ICD-10-CM

## 2014-07-22 DIAGNOSIS — E43 Unspecified severe protein-calorie malnutrition: Secondary | ICD-10-CM | POA: Insufficient documentation

## 2014-07-22 LAB — COMPREHENSIVE METABOLIC PANEL
ALBUMIN: 2.7 g/dL — AB (ref 3.5–5.2)
ALT: 14 U/L (ref 0–35)
AST: 23 U/L (ref 0–37)
Alkaline Phosphatase: 97 U/L (ref 39–117)
Anion gap: 6 (ref 5–15)
BUN: 12 mg/dL (ref 6–23)
CALCIUM: 8.9 mg/dL (ref 8.4–10.5)
CO2: 24 mmol/L (ref 19–32)
Chloride: 108 mEq/L (ref 96–112)
Creatinine, Ser: 0.66 mg/dL (ref 0.50–1.10)
GFR calc Af Amer: >90 mL/min (ref >90)
GFR calc non Af Amer: 82 mL/min — ABNORMAL LOW (ref >90)
Glucose, Bld: 178 mg/dL — ABNORMAL HIGH (ref 70–99)
POTASSIUM: 4 mmol/L (ref 3.5–5.1)
Sodium: 138 mmol/L (ref 135–145)
TOTAL PROTEIN: 5.9 g/dL — AB (ref 6.0–8.3)
Total Bilirubin: 0.6 mg/dL (ref 0.3–1.2)

## 2014-07-22 LAB — CBC
HCT: 26.5 % — ABNORMAL LOW (ref 36.0–46.0)
Hemoglobin: 7.9 g/dL — ABNORMAL LOW (ref 12.0–15.0)
MCH: 23.5 pg — AB (ref 26.0–34.0)
MCHC: 29.8 g/dL — AB (ref 30.0–36.0)
MCV: 78.9 fL (ref 78.0–100.0)
PLATELETS: 479 10*3/uL — AB (ref 150–400)
RBC: 3.36 MIL/uL — AB (ref 3.87–5.11)
RDW: 20.4 % — ABNORMAL HIGH (ref 11.5–15.5)
WBC: 5.2 10*3/uL (ref 4.0–10.5)

## 2014-07-22 LAB — TYPE AND SCREEN
ABO/RH(D): O POS
Antibody Screen: NEGATIVE

## 2014-07-22 LAB — ASPERGILLUS GALACTOMANNAN ANTIGEN
ASPERGILLUS GALACTOMANNAN AG: NOT DETECTED
Aspergillus galactomannan Index: 0.1 (ref <0.50)

## 2014-07-22 LAB — GLUCOSE, CAPILLARY
Glucose-Capillary: 192 mg/dL — ABNORMAL HIGH (ref 70–99)
Glucose-Capillary: 159 mg/dL — ABNORMAL HIGH (ref 70–99)

## 2014-07-22 LAB — PROTIME-INR
INR: 2.35 — AB (ref 0.00–1.49)
Prothrombin Time: 26 seconds — ABNORMAL HIGH (ref 11.6–15.2)

## 2014-07-22 LAB — MRSA PCR SCREENING: MRSA by PCR: NEGATIVE

## 2014-07-22 MED ORDER — SODIUM CHLORIDE 0.9 % IJ SOLN
10.0000 mL | Freq: Two times a day (BID) | INTRAMUSCULAR | Status: DC
  Administered 2014-07-22 – 2014-07-26 (×8): 10 mL

## 2014-07-22 MED ORDER — CETYLPYRIDINIUM CHLORIDE 0.05 % MT LIQD
7.0000 mL | Freq: Four times a day (QID) | OROMUCOSAL | Status: DC
  Administered 2014-07-23 (×2): 7 mL via OROMUCOSAL

## 2014-07-22 MED ORDER — PANTOPRAZOLE SODIUM 40 MG IV SOLR
40.0000 mg | Freq: Two times a day (BID) | INTRAVENOUS | Status: DC
  Administered 2014-07-22 – 2014-07-25 (×7): 40 mg via INTRAVENOUS
  Filled 2014-07-22 (×7): qty 40

## 2014-07-22 MED ORDER — MIDAZOLAM HCL 2 MG/2ML IJ SOLN
4.0000 mg | Freq: Once | INTRAMUSCULAR | Status: AC
  Administered 2014-07-22: 4 mg via INTRAVENOUS
  Filled 2014-07-22: qty 4

## 2014-07-22 MED ORDER — INSULIN ASPART 100 UNIT/ML ~~LOC~~ SOLN
0.0000 [IU] | SUBCUTANEOUS | Status: DC
  Administered 2014-07-22 – 2014-07-23 (×4): 2 [IU] via SUBCUTANEOUS
  Administered 2014-07-23 (×2): 1 [IU] via SUBCUTANEOUS
  Administered 2014-07-23: 2 [IU] via SUBCUTANEOUS
  Administered 2014-07-23: 1 [IU] via SUBCUTANEOUS
  Administered 2014-07-23: 2 [IU] via SUBCUTANEOUS
  Administered 2014-07-24 (×2): 1 [IU] via SUBCUTANEOUS
  Administered 2014-07-24: 2 [IU] via SUBCUTANEOUS
  Administered 2014-07-24: 1 [IU] via SUBCUTANEOUS
  Administered 2014-07-24: 3 [IU] via SUBCUTANEOUS
  Administered 2014-07-24 – 2014-07-25 (×3): 1 [IU] via SUBCUTANEOUS
  Administered 2014-07-25 (×4): 2 [IU] via SUBCUTANEOUS
  Administered 2014-07-26 (×2): 1 [IU] via SUBCUTANEOUS

## 2014-07-22 MED ORDER — SODIUM CHLORIDE 0.9 % IV SOLN: INTRAVENOUS | Status: DC

## 2014-07-22 MED ORDER — MIDAZOLAM HCL 2 MG/2ML IJ SOLN
2.0000 mg | Freq: Once | INTRAMUSCULAR | Status: AC
  Administered 2014-07-22: 2 mg via INTRAVENOUS

## 2014-07-22 MED ORDER — FENTANYL CITRATE 0.05 MG/ML IJ SOLN
50.0000 ug | Freq: Once | INTRAMUSCULAR | Status: AC
Start: 2014-07-22 — End: 2014-07-22
  Administered 2014-07-22: 50 ug via INTRAVENOUS

## 2014-07-22 MED ORDER — IOHEXOL 300 MG/ML  SOLN
100.0000 mL | Freq: Once | INTRAMUSCULAR | Status: AC | PRN
  Administered 2014-07-22: 100 mL via INTRAVENOUS

## 2014-07-22 MED ORDER — PROPOFOL 10 MG/ML IV BOLUS
INTRAVENOUS | Status: DC | PRN
  Administered 2014-07-22: 70 mg via INTRAVENOUS
  Administered 2014-07-22: 30 mg via INTRAVENOUS

## 2014-07-22 MED ORDER — FENTANYL CITRATE 0.05 MG/ML IJ SOLN
100.0000 ug | Freq: Once | INTRAMUSCULAR | Status: AC
Start: 2014-07-22 — End: 2014-07-22
  Administered 2014-07-22: 100 ug via INTRAVENOUS
  Filled 2014-07-22: qty 2

## 2014-07-22 MED ORDER — SODIUM CHLORIDE 0.9 % IJ SOLN
10.0000 mL | INTRAMUSCULAR | Status: DC | PRN
  Administered 2014-07-28: 20 mL
  Administered 2014-07-29 – 2014-07-30 (×2): 10 mL
  Filled 2014-07-22 (×3): qty 40

## 2014-07-22 MED ORDER — LORAZEPAM 2 MG/ML IJ SOLN
0.5000 mg | INTRAMUSCULAR | Status: DC | PRN
  Administered 2014-07-22 – 2014-07-25 (×2): 1 mg via INTRAVENOUS
  Filled 2014-07-22 (×4): qty 1

## 2014-07-22 MED ORDER — WARFARIN SODIUM 2.5 MG PO TABS
2.5000 mg | ORAL_TABLET | Freq: Once | ORAL | Status: DC
  Filled 2014-07-22 (×2): qty 1

## 2014-07-22 MED ORDER — CHLORHEXIDINE GLUCONATE 0.12 % MT SOLN
15.0000 mL | Freq: Two times a day (BID) | OROMUCOSAL | Status: DC
  Administered 2014-07-22 – 2014-07-23 (×2): 15 mL via OROMUCOSAL
  Filled 2014-07-22 (×2): qty 15

## 2014-07-22 MED ORDER — FENTANYL CITRATE 0.05 MG/ML IJ SOLN
50.0000 ug | Freq: Once | INTRAMUSCULAR | Status: AC
  Administered 2014-07-22: 50 ug via INTRAVENOUS

## 2014-07-22 MED ORDER — MIDAZOLAM HCL 2 MG/2ML IJ SOLN
INTRAMUSCULAR | Status: AC
  Administered 2014-07-22: 2 mg
  Filled 2014-07-22: qty 2

## 2014-07-22 MED ORDER — SODIUM CHLORIDE 0.9 % IV SOLN
INTRAVENOUS | Status: DC
  Administered 2014-07-22: 14:00:00 via INTRAVENOUS
  Administered 2014-07-23: 1000 mL via INTRAVENOUS
  Administered 2014-07-24 – 2014-07-25 (×3): via INTRAVENOUS

## 2014-07-22 MED ORDER — PROPOFOL 10 MG/ML IV BOLUS
INTRAVENOUS | Status: AC
  Filled 2014-07-22: qty 20

## 2014-07-22 MED ORDER — RACEPINEPHRINE HCL 2.25 % IN NEBU
0.5000 mL | INHALATION_SOLUTION | Freq: Once | RESPIRATORY_TRACT | Status: DC
  Filled 2014-07-22: qty 0.5

## 2014-07-22 MED ORDER — SODIUM CHLORIDE 0.9 % IV SOLN
25.0000 ug/h | INTRAVENOUS | Status: DC
  Administered 2014-07-22: 100 ug/h via INTRAVENOUS
  Administered 2014-07-23: 80 ug/h via INTRAVENOUS
  Filled 2014-07-22 (×2): qty 50

## 2014-07-22 MED ORDER — FENTANYL BOLUS VIA INFUSION
25.0000 ug | INTRAVENOUS | Status: DC | PRN
  Administered 2014-07-22: 25 ug via INTRAVENOUS
  Filled 2014-07-22: qty 25

## 2014-07-22 NOTE — Anesthesia Procedure Notes (Signed)
Procedure Name: Intubation Date/Time: 07/22/2014 12:31 PM Performed by: Lollie Sails Pre-anesthesia Checklist: Patient identified, Emergency Drugs available, Suction available, Patient being monitored and Timeout performed Patient Re-evaluated:Patient Re-evaluated prior to inductionOxygen Delivery Method: Non-rebreather mask and Ambu bag Preoxygenation: Pre-oxygenation with 100% oxygen Intubation Type: IV induction Ventilation: Mask ventilation without difficulty Laryngoscope Size: 3 (Glidescope 3) Grade View: Grade I Tube type: Subglottic suction tube Tube size: 7.0 mm Number of attempts: 3 (First attempt by CRNA - full view but unable to pass tube.  Patient ventilated between attempt - 2nd attempt by MDA  - unsuccessful.  Dr. Wilnette Kales in and passed tube.) Airway Equipment and Method: Video-laryngoscopy Placement Confirmation: ETT inserted through vocal cords under direct vision,  CO2 detector and breath sounds checked- equal and bilateral Tube secured with: Tape Dental Injury: Teeth and Oropharynx as per pre-operative assessment  Difficulty Due To: Difficult Airway- due to anterior larynx Future Recommendations: Recommend- induction with short-acting agent, and alternative techniques readily available

## 2014-07-22 NOTE — Progress Notes (Signed)
PULMONARY / CRITICAL CARE MEDICINE   Name: [name] MRN: [id number] DOB: 09-Jul-1932    ADMISSION DATE:  07/20/2014 CONSULTATION DATE:  07/21/14  REFERRING MD :  Dr. Allyson Mcmillan   CHIEF COMPLAINT:  Dyspnea, cough   INITIAL PRESENTATION: 81 y/o female patient of Dr. Gwenette Mcmillan with with granulomatous polyangitis and asthma was admitted on 1/19 with dyspnea, fever, wheeze.  STUDIES:  1/19  CT Chest >> thin membranous appearing structure in proximal trachea beneath the vocal cords of uncertain etiology, mild CHF, multiple nodules throughout lungs bilaterally L>R, no new large cavitary lesions  SIGNIFICANT EVENTS: 1/18  Admit with weight loss, fever 103, weakness & persistent cough 1/19  PCCM consulted for evaluation  1/20  Decompensated with stridor, to ICU for emergent intubation     SUBJECTIVE:   VITAL SIGNS: Temp:  [98.4 F (36.9 C)-98.6 F (37 C)] 98.6 F (37 C) (01/20 0603) Pulse Rate:  [96-109] 99 (01/20 0603) Resp:  [19-22] 20 (01/20 0603) BP: (101-139)/(56-88) 121/56 mmHg (01/20 0603) SpO2:  [96 %-100 %] 100 % (01/20 1016)   HEMODYNAMICS:     VENTILATOR SETTINGS:     INTAKE / OUTPUT:  Intake/Output Summary (Last 24 hours) at 07/22/14 1300 Last data filed at 07/22/14 0837  Gross per 24 hour  Intake    560 ml  Output      0 ml  Net    560 ml    PHYSICAL EXAMINATION: General:  wdwn elderly female in acute respiratory distress prior to intubation Neuro:  Sedate, comfortable on vent.  Oriented prior to intubation, MAE HEENT:  OETT, mm pink/dry Cardiovascular:  s1s2 difficult to auscultate due to adventitious breath sounds Lungs:  Labored prior to intubation, post without distress.  Lungs bilaterally with coarse rhonchi  Abdomen:  Flat, soft, bsx4 active  Musculoskeletal:  No acute deformities  Skin:  Warm/dry, no edema   LABS:  CBC  Recent Labs Lab 07/20/14 1713 07/21/14 0350 07/22/14 0700  WBC 4.9 4.6 5.2  HGB 8.3* 7.8* 7.9*  HCT 26.7* 25.6* 26.5*   PLT 469* 478* 479*   Coag's  Recent Labs Lab 07/20/14 1713 07/21/14 0350 07/22/14 0700  INR 2.14* 2.15* 2.35*   BMET  Recent Labs Lab 07/20/14 1320 07/20/14 1612 07/21/14 0350 07/22/14 0700  NA 135  --  140 138  K 3.9  --  3.7 4.0  CL 105  --  108 108  CO2 24  --  25 24  BUN 10  --  6 12  CREATININE 0.62 0.64 0.67 0.66  GLUCOSE 121*  --  105* 178*   Electrolytes  Recent Labs Lab 07/20/14 1320 07/20/14 1713 07/21/14 0350 07/22/14 0700  CALCIUM 8.9  --  8.8 8.9  MG  --  2.0  --   --    Sepsis Markers  Recent Labs Lab 07/20/14 1334  LATICACIDVEN 1.63   ABG No results for input(s): PHART, PCO2ART, PO2ART in the last 168 hours.   Liver Enzymes  Recent Labs Lab 07/20/14 1713 07/21/14 0350 07/22/14 0700  AST 22 24 23   ALT 12 11 14   ALKPHOS 100 94 97  BILITOT 0.4 0.4 0.6  ALBUMIN 2.8* 2.5* 2.7*   Cardiac Enzymes  Recent Labs Lab 07/20/14 1612 07/20/14 1613 07/21/14 0350  TROPONINI 0.20* 0.22* 0.17*   Glucose No results for input(s): GLUCAP in the last 168 hours.  Imaging Ct Chest W Contrast  07/21/2014   CLINICAL DATA:  81 year old female with history of small vessel vasculitis (  granulomatous polyangitis) with history of airway stenosis, presenting with loud wheeze and scant hemoptysis. Evaluate for potential parenchymal involvement of the lungs versus airway stenosis. Shortness of breath, wheezing and fever for 1 month.  EXAM: CT CHEST WITH CONTRAST  TECHNIQUE: Multidetector CT imaging of the chest was performed during intravenous contrast administration.  CONTRAST:  34mL OMNIPAQUE IOHEXOL 300 MG/ML  SOLN  COMPARISON:  Chest CT 09/24/2013.  FINDINGS: Comment: Examination is limited by considerable patient respiratory motion.  Mediastinum/Lymph Nodes: Heart size is mildly enlarged. There is no significant pericardial fluid, thickening or pericardial calcification. Calcifications of the mitral annulus. No pathologically enlarged mediastinal or hilar  lymph nodes. Esophagus is unremarkable in appearance.  Lungs/Pleura: In the proximal trachea shortly beneath the level of the vocal cords There is some thin irregular-shaped soft tissue which has the appearance of a small membrane, which appears intimately associated with the wall of the trachea. The trachea and mainstem bronchi are otherwise widely patent. Again noted are nodular appearing areas of architectural distortion in the lungs bilaterally, most notably in the left lower lobe where there is a 1.8 x 1.3 cm spiculated appearing nodular density in the superior segment of the left lower lobe, and a 2.4 x 1.5 cm spiculated appearing nodular density in the posterior aspect of the left lower lobe. Multiple other smaller nodules are noted. These two largest lesions appear more prominent than the prior study from 09/24/2013, however, when compared to more remote prior studies such that is 01/03/2011, both of these lesions appear slightly smaller, and are presumably areas of chronic postinflammatory scarring given the patient's history of vasculitis. There is a background of mild diffuse ground-glass attenuation with mild interlobular septal thickening, suggesting a background of mild interstitial pulmonary edema. Moderate bilateral pleural effusions are noted. Large bulla in the apex of the left hemithorax is unchanged.  Musculoskeletal/Soft Tissues: There are no aggressive appearing lytic or blastic lesions noted in the visualized portions of the skeleton.  Upper Abdomen: Unremarkable.  IMPRESSION: 1. There is a thin membranous appearing structure in the proximal trachea immediately beneath the level the vocal cords, which is of uncertain etiology and significance, but may account for the patient's upper airway wheezing on physical examination. Consideration for direct inspection with bronchoscopy is recommended if clinically appropriate. 2. The appearance of the chest suggests mild congestive heart failure, as  above. 3. Multiple nodules throughout the lungs bilaterally (left greater than right), similar in appearance to numerous prior examinations, presumably sequela of underlying Wegner's granulomatosis. No new large cavitary lesion to suggest active parenchymal involvement at this time. 4. Mild cardiomegaly.   Electronically Signed   By: Vinnie Langton M.D.   On: 07/21/2014 13:01   Dg Chest Port 1 View  07/21/2014   CLINICAL DATA:  F/u SOB, droplet precautions, hx. Asthma, fever, cough and congestion, chest tightness and wheezing  EXAM: PORTABLE CHEST - 1 VIEW  COMPARISON:  the previous day's study  FINDINGS: Increase in diffuse interstitial infiltrates or edema, with peripheral septal lines and some patchy new airspace opacities in bilateral lung bases. Heart size upper limits normal for technique. No effusion. Mild spondylitic changes in the mid thoracic spine.  IMPRESSION: 1. Progressive bilateral edema/infiltrates as above.   Electronically Signed   By: Arne Cleveland M.D.   On: 07/21/2014 11:02     ASSESSMENT / PLAN:  PULMONARY OETT 1/20 >>  A: Acute Respiratory Failure with Hypoxemia  Upper Airway Obstruction - possible obstruction with thin membranous structure in proximal  trachea & quarter sized expectorated dark semi-solid fibrinous mass upon intubation.  Diffuse chronically inflamed appearing airways with bronch.   Scant Hemoptysis Sinusitis Hx Aspergillosis  P:   Now intubation, vent support  F/U ABG in one hour Continue Xopenex Q8 + Q6 PRN  Hold Dulera  Now bronchoscopy to evaluate airways, BAL Surgical path of mass/tissue   CT Sinuses to r/o fungal infection Continue solumedrol 80 mg Q12  CARDIOVASCULAR CVL  A:  Tachycardia - in setting of respiratory distress, improved with intubation.  Mild CHF - noted on CT 1/20 P:  Supportive care NS @ 75 ml/hr Pending PICC line placement   RENAL A:   No Acute Issues  P:   Trend BMP Replace electrolytes as indicated    GASTROINTESTINAL A:   Vent Associated Dysphagia  P:   PPI  NPO  OGT Consider feeding in am 1/21 if remains intubated   HEMATOLOGIC A:   Wegener's Granulomatosis  Normocytic Anemia  Hx PE  Coagulopathy - chronic coumadin  P:  Continue coumadin per pharmacy   INFECTIOUS A:   R/O Sinusitis  GPC's in clusters 1/2 BC  P:   BCx2  1/18 >> GPC's in clusters 1/2 >>  UC 1/19 >> neg BAL 1/20 >>  AFB (bronch) 1/20 >>  Fungal (bronch) 1/20 >>  Cytology (fibrinous mass) 1/20 >>  Culture (finbrinous mass) 1/20 >>  Fungal (fibrinous mass) 1/20 >>  Vanco, start date 1/18, day 3/x Zosyn, start date 1/18, day 3/x  ENDOCRINE A:   Mild Hyperglycemia  P:   SSI, sensitive scale with steroids  NEUROLOGIC A:   Pain - vent associated, hx OA P:   RASS goal: -1 PAD 2 / Fentanyl gtt   FAMILY  - Updates: Husband updated at bedside, SIL updated over the phone by Dr. Lake Bells.     Noe Gens, NP-C [facility] Pgr: [phone] or [phone]   07/22/2014, 1:00 PM

## 2014-07-22 NOTE — Progress Notes (Signed)
Peripherally Inserted Central Catheter/Midline Placement  The IV Nurse has discussed with the patient and/or persons authorized to consent for the patient, the purpose of this procedure and the potential benefits and risks involved with this procedure.  The benefits include less needle sticks, lab draws from the catheter and patient may be discharged home with the catheter.  Risks include, but not limited to, infection, bleeding, blood clot (thrombus formation), and puncture of an artery; nerve damage and irregular heat beat.  Alternatives to this procedure were also discussed.  PICC/Midline Placement Documentation  PICC / Midline Double Lumen 07/22/14 PICC Left Brachial 41 cm 0 cm (Active)  Indication for Insertion or Continuance of Line Poor Vasculature-patient has had multiple peripheral attempts or PIVs lasting less than 24 hours 07/22/2014  3:00 PM  Exposed Catheter (cm) 0 cm 07/22/2014  3:00 PM  Dressing Change Due 07/29/14 07/22/2014  3:00 PM       Jule Economy Horton 07/22/2014, 3:32 PM

## 2014-07-22 NOTE — Procedures (Signed)
Intubation Procedure Note [name] [id number] 1932/11/07  Procedure: Intubation Indications: Respiratory insufficiency  Procedure Details Consent: Unable to obtain consent because of emergent medical necessity. Time Out: Verified patient identification, verified procedure, site/side was marked, verified correct patient position, special equipment/implants available, medications/allergies/relevent history reviewed, required imaging and test results available.  Performed  Drugs Propofol per anestheia, Ativan 1mg  DL x 3 with GS3 blade, thick black fibrinous material hanging from pharynx to below the cords, there appeared to be a fibrinous mass below the cords Grade 1 view 7.0 tube passed through cords under direct visualization Placement confirmed with bilateral breath sounds, positive EtCO2 change and smoke in tube   Evaluation Hemodynamic Status: BP stable throughout; O2 sats: transiently fell during during procedure to 81% for less than 15 seconds and increased with bagging Patient's Current Condition: stable Complications: No apparent complications Patient did tolerate procedure well. Chest X-ray ordered to verify placement.  CXR: pending.   [name] 07/22/2014

## 2014-07-22 NOTE — Progress Notes (Deleted)
Pharmacy - Physician Communication: IV to PO pantoprazole  The patient is receiving Protonix by the intravenous route.  Based on criteria approved by the Pharmacy and [facility], the medication is being converted to the equivalent oral dose form.  These criteria include: -No Active GI bleeding -Able to tolerate diet of full liquids (or better) or tube feeding - on regular diet -Able to tolerate other medications by the oral or enteral route  If you have any questions about this conversion, please contact the Pharmacy Department (phone [phone]).  Thank you.  Thanks, Doreene Eland, PharmD, BCPS.   Pager: [phone] 07/22/2014 11:42 AM

## 2014-07-22 NOTE — Progress Notes (Addendum)
[facility] PROGRESS NOTE  [name] GYJ:[id number] DOB: 12-01-1932 DOA: 07/20/2014 PCP:  [name], MD  Assessment/Plan: Principal Problem:   HCAP (healthcare-associated pneumonia) Active Problems:   Dyspnea   Protein-calorie malnutrition, severe     Shortness of breath symptoms unchanged Likely secondary to mild URI vs Wegener's exacerbation vs HCAP  Given the fact that patient is immunocompromised and has a high-grade fever, will obtain blood cultures Continue broad-spectrum antibiotics vancomycin and Zosyn She has a history of aspergillosis Discussed with Dr. Megan Salon on the phone, infectious disease Follow-up aspergillosis galactomann assay Patient wheezing this morning therefore started the patient on IV steroids, KVO fluids CT scan shows  thin membranous appearing structure in the proximal trachea immediately beneath the level the vocal cords, may benefit from a bronchoscopy Defer to pulmonary Still wheezing after being started on IV steroids Cont nebs ,advair   Normocytic anemia Patient is on Coumadin, INR therapaeutic  Patient had a colonoscopy in August 2012  that showed internal hemorrhoids and a few diverticula FOBT negative INR therapeutic Doubt that the patient can have inpatient  GI  workup given her pulmonary symptoms, especially now that her hemoglobin is stable, no immediate workup is indicated Will transfuse for hemoglobin less than 7.0   History of DVT Patient has an IVC filter in place Continue Coumadin  Wegener's granulomatosis Pulmonary consult,Dr Pennie Banter, may need CT imaging, will order CT with contrast  Conveyed results of ESR ,CRP TO rheumatology  According to the patient's rheumatologist [name], patient not a candidate for rituximab at this point, due to concerns about underlying infection Defer further management to pulmonary    Code Status: full Family Communication: DISCUSSED WITH Karn Pickler [phone], son in  law Disposition Plan:  As above    Brief narrative: 81 year old female from SNF, history of Wegener's granulomatosis followed by [name], asthma who presents to the ER because of worsening shortness of breath, fever 103. The patient has not been feeling well for a couple of months gradually worsening over the last 3-4 weeks. The patient states that 2 months ago she was diagnosed with anemia, hemoglobin significantly lower than baseline. She did not have any extensive workup other than been provided with a referral to see [name]. This consult has not taken place as of yet. She denies any hematochezia, melena. She describes 15 pound weight loss in the last 2-3 years. She subsequently fell ill Christmas Eve, so her PCP office and he placed her on levofloxacin for 7 days for suspected pneumonia. Patient was subsequently seen at urgent care on 1/8 for weakness and persistent cough rx sx . In the ED the patient was found to have a normal white count of 5.4. Chest x-ray shows emphysema with increased interstitial markings.  Consultants:  PULMONARY  Procedures:  None   Antibiotics: Zosyn,vanc   HPI/Subjective: Breathing is unchanged, significant upper airway wheezing, no nausea vomiting or chest pain   Objective: Filed Vitals:   07/21/14 2037 07/21/14 2040 07/22/14 0603 07/22/14 1016  BP:  101/62 121/56   Pulse:  96 99   Temp:  98.5 F (36.9 C) 98.6 F (37 C)   TempSrc:  Oral Oral   Resp:  22 20   Height:      Weight:      SpO2: 100% 100% 100% 100%    Intake/Output Summary (Last 24 hours) at 07/22/14 1034 Last data filed at 07/22/14 0837  Gross per 24 hour  Intake    560 ml  Output      0 ml  Net    560 ml    Exam:  General: alert & oriented x 3 In NAD  Cardiovascular: RRR, nl S1 s2  Respiratory: Upper airway wheezing, scattered rhonchi, no crackles  Abdomen: soft +BS NT/ND, no masses palpable  Extremities: No cyanosis and no edema      Data  Reviewed: Basic Metabolic Panel:  Recent Labs Lab 07/20/14 1320 07/20/14 1612 07/20/14 1713 07/21/14 0350 07/22/14 0700  NA 135  --   --  140 138  K 3.9  --   --  3.7 4.0  CL 105  --   --  108 108  CO2 24  --   --  25 24  GLUCOSE 121*  --   --  105* 178*  BUN 10  --   --  6 12  CREATININE 0.62 0.64  --  0.67 0.66  CALCIUM 8.9  --   --  8.8 8.9  MG  --   --  2.0  --   --     Liver Function Tests:  Recent Labs Lab 07/20/14 1713 07/21/14 0350 07/22/14 0700  AST 22 24 23   ALT 12 11 14   ALKPHOS 100 94 97  BILITOT 0.4 0.4 0.6  PROT 5.7* 5.4* 5.9*  ALBUMIN 2.8* 2.5* 2.7*   No results for input(s): LIPASE, AMYLASE in the last 168 hours. No results for input(s): AMMONIA in the last 168 hours.  CBC:  Recent Labs Lab 07/20/14 1320 07/20/14 1713 07/21/14 0350 07/22/14 0700  WBC 5.4 4.9 4.6 5.2  HGB 8.3* 8.3* 7.8* 7.9*  HCT 27.0* 26.7* 25.6* 26.5*  MCV 78.5 79.5 78.8 78.9  PLT 487* 469* 478* 479*    Cardiac Enzymes:  Recent Labs Lab 07/20/14 1320 07/20/14 1612 07/20/14 1613 07/21/14 0350  TROPONINI 0.04* 0.20* 0.22* 0.17*   BNP (last 3 results) No results for input(s): PROBNP in the last 8760 hours.   CBG: No results for input(s): GLUCAP in the last 168 hours.  Recent Results (from the past 240 hour(s))  Culture, blood (routine x 2)     Status: None (Preliminary result)   Collection Time: 07/20/14  1:20 PM  Result Value Ref Range Status   Specimen Description BLOOD RIGHT ANTECUBITAL  Final   Special Requests BOTTLES DRAWN AEROBIC AND ANAEROBIC 3ML  Final   Culture   Final           BLOOD CULTURE RECEIVED NO GROWTH TO DATE CULTURE WILL BE HELD FOR 5 DAYS BEFORE ISSUING A FINAL NEGATIVE REPORT Performed at [facility]    Report Status PENDING  Incomplete  Culture, blood (routine x 2)     Status: None (Preliminary result)   Collection Time: 07/20/14  1:22 PM  Result Value Ref Range Status   Specimen Description BLOOD RIGHT FOREARM  Final    Special Requests BOTTLES DRAWN AEROBIC AND ANAEROBIC 3ML  Final   Culture   Final           BLOOD CULTURE RECEIVED NO GROWTH TO DATE CULTURE WILL BE HELD FOR 5 DAYS BEFORE ISSUING A FINAL NEGATIVE REPORT Performed at [facility]    Report Status PENDING  Incomplete  Urine culture     Status: None   Collection Time: 07/20/14  1:40 PM  Result Value Ref Range Status   Specimen Description URINE, CLEAN CATCH  Final   Special Requests NONE  Final   Colony Count NO GROWTH Performed  at [facility]   Final   Culture NO GROWTH Performed at [facility]   Final   Report Status 07/21/2014 FINAL  Final     Studies: Dg Chest 2 View (if Patient Has Fever And/or Copd)  07/20/2014   CLINICAL DATA:  Cough and shortness of breath for 1 month.  EXAM: CHEST  2 VIEW  COMPARISON:  Chest x-rays dated 07/10/2014 and 07/08/2014 and 06/25/2014 and chest CT dated 09/24/2013  FINDINGS: The heart size and pulmonary vascularity are normal. There are multiple areas of scarring in both lungs, particularly in the right upper lobe and left lung base. There is a small area of scarring laterally at the left lung base as well as posterior medially. There is a tiny right pleural effusion, unchanged since 06/25/2014 but new since 09/24/2013.  The markings are slightly more accentuated at the bases than on the prior study.  No acute osseous abnormalities. There are emphysematous changes bilaterally.  IMPRESSION: Emphysema with scarring in both lungs. Slight increased accentuation of the interstitial markings could represent superimposed bronchitis. Persistent small right pleural effusion.   Electronically Signed   By: Rozetta Nunnery M.D.   On: 07/20/2014 14:17   Dg Chest 2 View  07/10/2014   CLINICAL DATA:  Cough and congestion .  EXAM: CHEST  2 VIEW  COMPARISON:  07/08/2014.  06/25/2014.  FINDINGS: Mediastinum and hilar structures normal. Stable diffuse bilateral pulmonary interstitial mild prominence noted  suggesting chronic interstitial lung disease. Pleural parenchymal thickening consistent with scarring. COPD. Heart size is stable. No pulmonary venous congestion. Scoliosis thoracic spine with diffuse degenerative change.  IMPRESSION: Stable mild interstitial prominence consistent with chronic interstitial lung disease. Pleural parenchymal scarring. COPD. No acute cardiopulmonary disease.   Electronically Signed   By: Marcello Moores  Register   On: 07/10/2014 12:32   Ct Chest W Contrast  07/21/2014   CLINICAL DATA:  81 year old female with history of small vessel vasculitis (granulomatous polyangitis) with history of airway stenosis, presenting with loud wheeze and scant hemoptysis. Evaluate for potential parenchymal involvement of the lungs versus airway stenosis. Shortness of breath, wheezing and fever for 1 month.  EXAM: CT CHEST WITH CONTRAST  TECHNIQUE: Multidetector CT imaging of the chest was performed during intravenous contrast administration.  CONTRAST:  48mL OMNIPAQUE IOHEXOL 300 MG/ML  SOLN  COMPARISON:  Chest CT 09/24/2013.  FINDINGS: Comment: Examination is limited by considerable patient respiratory motion.  Mediastinum/Lymph Nodes: Heart size is mildly enlarged. There is no significant pericardial fluid, thickening or pericardial calcification. Calcifications of the mitral annulus. No pathologically enlarged mediastinal or hilar lymph nodes. Esophagus is unremarkable in appearance.  Lungs/Pleura: In the proximal trachea shortly beneath the level of the vocal cords There is some thin irregular-shaped soft tissue which has the appearance of a small membrane, which appears intimately associated with the wall of the trachea. The trachea and mainstem bronchi are otherwise widely patent. Again noted are nodular appearing areas of architectural distortion in the lungs bilaterally, most notably in the left lower lobe where there is a 1.8 x 1.3 cm spiculated appearing nodular density in the superior segment of the  left lower lobe, and a 2.4 x 1.5 cm spiculated appearing nodular density in the posterior aspect of the left lower lobe. Multiple other smaller nodules are noted. These two largest lesions appear more prominent than the prior study from 09/24/2013, however, when compared to more remote prior studies such that is 01/03/2011, both of these lesions appear slightly smaller, and are  presumably areas of chronic postinflammatory scarring given the patient's history of vasculitis. There is a background of mild diffuse ground-glass attenuation with mild interlobular septal thickening, suggesting a background of mild interstitial pulmonary edema. Moderate bilateral pleural effusions are noted. Large bulla in the apex of the left hemithorax is unchanged.  Musculoskeletal/Soft Tissues: There are no aggressive appearing lytic or blastic lesions noted in the visualized portions of the skeleton.  Upper Abdomen: Unremarkable.  IMPRESSION: 1. There is a thin membranous appearing structure in the proximal trachea immediately beneath the level the vocal cords, which is of uncertain etiology and significance, but may account for the patient's upper airway wheezing on physical examination. Consideration for direct inspection with bronchoscopy is recommended if clinically appropriate. 2. The appearance of the chest suggests mild congestive heart failure, as above. 3. Multiple nodules throughout the lungs bilaterally (left greater than right), similar in appearance to numerous prior examinations, presumably sequela of underlying Wegner's granulomatosis. No new large cavitary lesion to suggest active parenchymal involvement at this time. 4. Mild cardiomegaly.   Electronically Signed   By: Vinnie Langton M.D.   On: 07/21/2014 13:01   Dg Chest Port 1 View  07/21/2014   CLINICAL DATA:  F/u SOB, droplet precautions, hx. Asthma, fever, cough and congestion, chest tightness and wheezing  EXAM: PORTABLE CHEST - 1 VIEW  COMPARISON:  the  previous day's study  FINDINGS: Increase in diffuse interstitial infiltrates or edema, with peripheral septal lines and some patchy new airspace opacities in bilateral lung bases. Heart size upper limits normal for technique. No effusion. Mild spondylitic changes in the mid thoracic spine.  IMPRESSION: 1. Progressive bilateral edema/infiltrates as above.   Electronically Signed   By: Arne Cleveland M.D.   On: 07/21/2014 11:02    Scheduled Meds: . levalbuterol  1.25 mg Nebulization 3 times per day  . methylPREDNISolone (SOLU-MEDROL) injection  80 mg Intravenous Q12H  . mometasone-formoterol  2 puff Inhalation BID  . pantoprazole (PROTONIX) IV  40 mg Intravenous Q12H  . piperacillin-tazobactam (ZOSYN)  IV  3.375 g Intravenous Q8H  . sodium chloride  3 mL Intravenous Q12H  . vancomycin  500 mg Intravenous Q12H  . Warfarin - Pharmacist Dosing Inpatient   Does not apply q1800   Continuous Infusions:   Principal Problem:   HCAP (healthcare-associated pneumonia) Active Problems:   Dyspnea   Protein-calorie malnutrition, severe    Time spent: 40 minutes   [facility] Pager [phone]. If 7PM-7AM, please contact night-coverage at [email or web address], password [facility] 07/22/2014, 10:34 AM  LOS: 2 days

## 2014-07-22 NOTE — Addendum Note (Signed)
Addendum  created 07/22/14 1332 by Arabella Merles, MD   Modules edited: Anesthesia Attestations

## 2014-07-22 NOTE — Progress Notes (Signed)
Pt transported to CT scan on 100% FIO2.  Pt tolerated well, no apparent complications.  RT to monitor and assess as needed.

## 2014-07-22 NOTE — Progress Notes (Signed)
[facility] for Warfarin Indication: Hx PE  Allergies  Allergen Reactions  . Adhesive [Tape] Rash    Patient Measurements: Height: 5\' 2"  (157.5 cm) Weight: 125 lb (56.7 kg) IBW/kg (Calculated) : 50.1 Heparin Dosing Weight:   Vital Signs: Temp: 98.6 F (37 C) (01/20 0603) Temp Source: Oral (01/20 0603) BP: 121/56 mmHg (01/20 0603) Pulse Rate: 99 (01/20 0603)  Labs:  Recent Labs  07/20/14 1612 07/20/14 1613 07/20/14 1713 07/21/14 0350 07/22/14 0700  HGB  --   --  8.3* 7.8* 7.9*  HCT  --   --  26.7* 25.6* 26.5*  PLT  --   --  469* 478* 479*  LABPROT  --   --  24.1* 24.2* 26.0*  INR  --   --  2.14* 2.15* 2.35*  CREATININE 0.64  --   --  0.67 0.66  TROPONINI 0.20* 0.22*  --  0.17*  --     Estimated Creatinine Clearance: 45.1 mL/min (by C-G formula based on Cr of 0.66).  Assessment: 61 yoF [facility], fever, and worsening productive cough x 1 month. Note PMHx of PE on chronic warfarin anticoagulation.  Pharmacy consulted to resume warfarin inpatient.    Home dose warfarin: 2.5mg  daily except 3.75mg  on M/W/F, LD 1/17  Today, 07/22/2014  INR = 2.35, therapeutic  CBC: Hgb = 7.9 - stable (admission = 8.3), plts elevated  FOBT negative 1/19  Diet: regular  Drug interactions: steroids and antibiotics make reduce warfarin needs  Goal of Therapy:  INR 2-3 Monitor platelets by anticoagulation protocol: Yes   Plan:   Due to acute illness, steroids and antibiotics will give Warfarin 2.5mg  po x 1 tonight instead of 3.75mg  that takes at home on Wed  Daily PT/INR  Monitor for bleeding  Doreene Eland, PharmD, BCPS.   Pager: [phone] 07/22/2014 11:29 AM

## 2014-07-22 NOTE — Procedures (Signed)
PCCM Bronchoscopy Procedure Note  The patient was informed of the risks (including but not limited to bleeding, infection, respiratory failure, lung injury, tooth/oral injury) and benefits of the procedure and gave consent, see chart.  Indication: Acute respiratory failure with hypoxemia  Location: 1237  Condition pre procedure: Critically ill  Medications for procedure: Fentanyl 131mcg  Procedure description: The bronchoscope was introduced through the endotracheal tube and passed to the bilateral lungs to the level of the subsegmental bronchi throughout the tracheobronchial tree.  Airway exam revealed a sharp carina, chronically inflamed airways, particularly in the left subcarina and right upper lobe, no clear stenosis and all airways were patent.  There were no significant secretions and no bleeding.  No mass noted.  Procedures performed: BAL RML  Specimens sent: BAL RML bacterial, fungal, AFB  Condition post procedure: critically ill, stable on vent  EBL: none  Complications: none  Post Procedure diagnosis: chronic inflammatory bronchitis, granulomatous polyangitis  Roselie Awkward, MD [facility] Pager: [phone] Cell: [phone] If no response, call [phone]

## 2014-07-23 ENCOUNTER — Encounter (HOSPITAL_COMMUNITY): Payer: Self-pay | Admitting: *Deleted

## 2014-07-23 ENCOUNTER — Inpatient Hospital Stay (HOSPITAL_COMMUNITY): Payer: Medicare Other

## 2014-07-23 DIAGNOSIS — E43 Unspecified severe protein-calorie malnutrition: Secondary | ICD-10-CM

## 2014-07-23 LAB — VANCOMYCIN, TROUGH: Vancomycin Tr: 12.6 ug/mL (ref 10.0–20.0)

## 2014-07-23 LAB — BLOOD GAS, ARTERIAL
ACID-BASE DEFICIT: 3.3 mmol/L — AB (ref 0.0–2.0)
Bicarbonate: 21.7 mEq/L (ref 20.0–24.0)
DRAWN BY: 295031
FIO2: 1 %
LHR: 14 {breaths}/min
PCO2 ART: 41.1 mmHg (ref 35.0–45.0)
PEEP/CPAP: 5 cmH2O
PH ART: 7.341 — AB (ref 7.350–7.450)
Patient temperature: 98.6
TCO2: 22.9 mmol/L (ref 0–100)
VT: 450 mL
pO2, Arterial: 354 mmHg — ABNORMAL HIGH (ref 80.0–100.0)

## 2014-07-23 LAB — GLUCOSE, CAPILLARY
GLUCOSE-CAPILLARY: 155 mg/dL — AB (ref 70–99)
GLUCOSE-CAPILLARY: 173 mg/dL — AB (ref 70–99)
GLUCOSE-CAPILLARY: 141 mg/dL — AB (ref 70–99)
Glucose-Capillary: 137 mg/dL — ABNORMAL HIGH (ref 70–99)
Glucose-Capillary: 141 mg/dL — ABNORMAL HIGH (ref 70–99)
Glucose-Capillary: 154 mg/dL — ABNORMAL HIGH (ref 70–99)
Glucose-Capillary: 132 mg/dL — ABNORMAL HIGH (ref 70–99)
Glucose-Capillary: 151 mg/dL — ABNORMAL HIGH (ref 70–99)

## 2014-07-23 LAB — CBC
HCT: 24 % — ABNORMAL LOW (ref 36.0–46.0)
HEMOGLOBIN: 7.4 g/dL — AB (ref 12.0–15.0)
MCH: 24.7 pg — ABNORMAL LOW (ref 26.0–34.0)
MCHC: 30.8 g/dL (ref 30.0–36.0)
MCV: 80.3 fL (ref 78.0–100.0)
Platelets: 384 10*3/uL (ref 150–400)
RBC: 2.99 MIL/uL — ABNORMAL LOW (ref 3.87–5.11)
RDW: 20.4 % — ABNORMAL HIGH (ref 11.5–15.5)
WBC: 10.8 10*3/uL — ABNORMAL HIGH (ref 4.0–10.5)

## 2014-07-23 LAB — COMPREHENSIVE METABOLIC PANEL
ALK PHOS: 97 U/L (ref 39–117)
ALT: 23 U/L (ref 0–35)
AST: 36 U/L (ref 0–37)
Albumin: 2.3 g/dL — ABNORMAL LOW (ref 3.5–5.2)
Anion gap: 4 — ABNORMAL LOW (ref 5–15)
BILIRUBIN TOTAL: 0.4 mg/dL (ref 0.3–1.2)
BUN: 19 mg/dL (ref 6–23)
CO2: 24 mmol/L (ref 19–32)
Calcium: 8.5 mg/dL (ref 8.4–10.5)
Chloride: 113 mEq/L — ABNORMAL HIGH (ref 96–112)
Creatinine, Ser: 0.7 mg/dL (ref 0.50–1.10)
GFR calc Af Amer: >90 mL/min (ref >90)
GFR calc non Af Amer: 80 mL/min — ABNORMAL LOW (ref >90)
GLUCOSE: 140 mg/dL — AB (ref 70–99)
Potassium: 4 mmol/L (ref 3.5–5.1)
SODIUM: 141 mmol/L (ref 135–145)
Total Protein: 5 g/dL — ABNORMAL LOW (ref 6.0–8.3)

## 2014-07-23 LAB — CULTURE, BLOOD (ROUTINE X 2)

## 2014-07-23 LAB — PROTIME-INR
INR: 2.53 — AB (ref 0.00–1.49)
Prothrombin Time: 27.5 seconds — ABNORMAL HIGH (ref 11.6–15.2)

## 2014-07-23 MED ORDER — DIPHENHYDRAMINE HCL 25 MG PO CAPS
25.0000 mg | ORAL_CAPSULE | Freq: Every evening | ORAL | Status: DC | PRN
  Administered 2014-07-24 – 2014-07-25 (×3): 25 mg via ORAL
  Filled 2014-07-23 (×3): qty 1

## 2014-07-23 MED ORDER — CETYLPYRIDINIUM CHLORIDE 0.05 % MT LIQD
7.0000 mL | Freq: Two times a day (BID) | OROMUCOSAL | Status: DC
  Administered 2014-07-23 – 2014-07-29 (×11): 7 mL via OROMUCOSAL

## 2014-07-23 MED ORDER — WARFARIN SODIUM 2.5 MG PO TABS
2.5000 mg | ORAL_TABLET | Freq: Once | ORAL | Status: AC
  Administered 2014-07-23: 2.5 mg via ORAL
  Filled 2014-07-23: qty 1

## 2014-07-23 MED ORDER — FENTANYL CITRATE 0.05 MG/ML IJ SOLN: 12.5000 ug | INTRAMUSCULAR | Status: DC | PRN

## 2014-07-23 MED ORDER — CHLORHEXIDINE GLUCONATE 0.12 % MT SOLN
15.0000 mL | Freq: Two times a day (BID) | OROMUCOSAL | Status: DC
  Administered 2014-07-23 – 2014-07-30 (×13): 15 mL via OROMUCOSAL
  Filled 2014-07-23 (×16): qty 15

## 2014-07-23 MED ORDER — VANCOMYCIN HCL IN DEXTROSE 750-5 MG/150ML-% IV SOLN
750.0000 mg | Freq: Two times a day (BID) | INTRAVENOUS | Status: DC
Start: 2014-07-23 — End: 2014-07-24
  Administered 2014-07-23 – 2014-07-24 (×2): 750 mg via INTRAVENOUS
  Filled 2014-07-23 (×2): qty 150

## 2014-07-23 NOTE — Progress Notes (Signed)
ANTIBIOTIC CONSULT NOTE - FOLLOW UP  Pharmacy Consult for Vancomycin Indication: HCAP  Allergies  Allergen Reactions  . Adhesive [Tape] Rash    Patient Measurements: Height: 5\' 2"  (157.5 cm) Weight: 134 lb 14.7 oz (61.2 kg) IBW/kg (Calculated) : 50.1 Adjusted Body Weight:   Vital Signs: Temp: 98.1 F (36.7 C) (01/21 0500) Temp Source: Core (Comment) (01/21 0400) BP: 82/44 mmHg (01/21 0500) Pulse Rate: 66 (01/21 0500) Intake/Output from previous day: 01/20 0701 - 01/21 0700 In: 1948.4 [P.O.:120; I.V.:1378.4; IV Piggyback:450] Out: 575 [Urine:575] Intake/Output from this shift: Total I/O In: 1099.6 [I.V.:949.6; IV Piggyback:150] Out: 335 [Urine:335]  Labs:  Recent Labs  07/21/14 0350 07/22/14 0700 07/23/14 0400  WBC 4.6 5.2 10.8*  HGB 7.8* 7.9* 7.4*  PLT 478* 479* 384  CREATININE 0.67 0.66 0.70   Estimated Creatinine Clearance: 49.1 mL/min (by C-G formula based on Cr of 0.7).  Recent Labs  07/23/14 0300 07/23/14 0400  VANCOTROUGH QUESTIONABLE RESULTS, RECOMMEND RECOLLECT TO VERIFY 12.6     Microbiology: Recent Results (from the past 720 hour(s))  Culture, blood (routine x 2)     Status: None (Preliminary result)   Collection Time: 07/20/14  1:20 PM  Result Value Ref Range Status   Specimen Description BLOOD RIGHT ANTECUBITAL  Final   Special Requests BOTTLES DRAWN AEROBIC AND ANAEROBIC 3ML  Final   Culture   Final    GRAM POSITIVE COCCI IN CLUSTERS Note: Gram Stain Report Called to,Read Back By and Verified With: JENNIFER HUFF 07/22/14 AT 1150 AM BY [facility] Performed at [facility]    Report Status PENDING  Incomplete  Culture, blood (routine x 2)     Status: None (Preliminary result)   Collection Time: 07/20/14  1:22 PM  Result Value Ref Range Status   Specimen Description BLOOD RIGHT FOREARM  Final   Special Requests BOTTLES DRAWN AEROBIC AND ANAEROBIC 3ML  Final   Culture   Final           BLOOD CULTURE RECEIVED NO GROWTH TO DATE CULTURE  WILL BE HELD FOR 5 DAYS BEFORE ISSUING A FINAL NEGATIVE REPORT Performed at [facility]    Report Status PENDING  Incomplete  Urine culture     Status: None   Collection Time: 07/20/14  1:40 PM  Result Value Ref Range Status   Specimen Description URINE, CLEAN CATCH  Final   Special Requests NONE  Final   Colony Count NO GROWTH Performed at [facility]   Final   Culture NO GROWTH Performed at [facility]   Final   Report Status 07/21/2014 FINAL  Final  MRSA PCR Screening     Status: None   Collection Time: 07/22/14  1:07 PM  Result Value Ref Range Status   MRSA by PCR NEGATIVE NEGATIVE Final    Comment:        The GeneXpert MRSA Assay (FDA approved for NASAL specimens only), is one component of a comprehensive MRSA colonization surveillance program. It is not intended to diagnose MRSA infection nor to guide or monitor treatment for MRSA infections.     Anti-infectives    Start     Dose/Rate Route Frequency Ordered Stop   07/23/14 1800  vancomycin (VANCOCIN) IVPB 750 mg/150 ml premix     750 mg150 mL/hr over 60 Minutes Intravenous Every 12 hours 07/23/14 0604     07/21/14 0600  vancomycin (VANCOCIN) 500 mg in sodium chloride 0.9 % 100 mL IVPB  Status:  Discontinued  500 mg100 mL/hr over 60 Minutes Intravenous Every 12 hours 07/20/14 1622 07/23/14 0604   07/21/14 0000  piperacillin-tazobactam (ZOSYN) IVPB 3.375 g     3.375 g12.5 mL/hr over 240 Minutes Intravenous Every 8 hours 07/20/14 1623     07/20/14 1445  vancomycin (VANCOCIN) IVPB 1000 mg/200 mL premix     1,000 mg200 mL/hr over 60 Minutes Intravenous  Once 07/20/14 1433 07/20/14 1600   07/20/14 1445  piperacillin-tazobactam (ZOSYN) IVPB 3.375 g     3.375 g12.5 mL/hr over 240 Minutes Intravenous  Once 07/20/14 1433 07/20/14 2015      Assessment: Patient with vancomycin level below goal.  Prior doses charted correctly.    Goal of Therapy:  Vancomycin trough level 15-20  mcg/ml  Plan:  Measure antibiotic drug levels at steady state Follow up culture results Change to vancomycin 750mg  iv q12hr  Nani Skillern Crowford 07/23/2014,6:07 AM

## 2014-07-23 NOTE — Progress Notes (Addendum)
CSW continuing to follow.  CSW received phone call from pt son-in-law, Jerolyn Center. Pt son-in-law discussed that pt daughter, Katharine Look is about to travel to come to [place] to be with pt. Pt son-in-law expressed inquired with this CSW about potential discounted rates at hotels near the hospital. CSW discussed that unfortunately the hospital does not have a contract with any local hotel for discounted hotel rates. CSW discussed with pt son-in-law that [employer] [facility] assist in housing at the [facility] which is offered to out of town family members. CSW discussed with pt son-in-law that CSW will contact the Chaplain and provide pt son-in-law contact information in order for the Chaplain to communicate with pt son-in-law regarding arrangements for [facility] if it is available at this time. Pt son-in-law provided his contact information [phone]) and stated that pt daughter, Katharine Look is boarding a plane to come to [place], but her contact information if needed is [phone]).   CSW contacted Chaplain, Jerene Pitch to discuss. Chaplain plans to communicate with pt son-in-law regarding [facility].  CSW to continue to follow to provide support and assist with disposition needs as appropriate.  Alison Murray, MSW, [place] [facility] [phone]

## 2014-07-23 NOTE — Progress Notes (Signed)
Referral from SW re: housing for daughter in route to [place] from [place].    Spoke with [name], pt's son in law about arranging accomodation at [employer].   As [name] (pt's daughter) will not be in town until after office hours, we were able to get clearance to allow pt's minister [facility]) to fill out paperwork and get key for room in [place].  Key will need to be picked up before 5.    Called [name] to report option for retrieving key.  Left message on voicemail with [facility] pager number and phone number.     Jen[place], [place]

## 2014-07-23 NOTE — Progress Notes (Signed)
Pt c/o insomnia. Pt states last night she received benadryl to help her rest and it worked well. Asked if she could have it again this pm. Called md and stated that he would order benadryl.

## 2014-07-23 NOTE — Progress Notes (Signed)
[place] for Warfarin Indication: Hx PE  Allergies  Allergen Reactions  . Adhesive [Tape] Rash    Patient Measurements: Height: 5\' 2"  (157.5 cm) Weight: 134 lb 14.7 oz (61.2 kg) IBW/kg (Calculated) : 50.1  Vital Signs: Temp: 98.1 F (36.7 C) (01/21 0600) Temp Source: Core (Comment) (01/21 0400) BP: 129/64 mmHg (01/21 0600) Pulse Rate: 90 (01/21 0600)  Labs:  Recent Labs  07/20/14 1612 07/20/14 1613  07/21/14 0350 07/22/14 0700 07/23/14 0400  HGB  --   --   < > 7.8* 7.9* 7.4*  HCT  --   --   < > 25.6* 26.5* 24.0*  PLT  --   --   < > 478* 479* 384  LABPROT  --   --   < > 24.2* 26.0* 27.5*  INR  --   --   < > 2.15* 2.35* 2.53*  CREATININE 0.64  --   --  0.67 0.66 0.70  TROPONINI 0.20* 0.22*  --  0.17*  --   --   < > = values in this interval not displayed.  Estimated Creatinine Clearance: 49.1 mL/min (by C-G formula based on Cr of 0.7).  Assessment: 30 yoF [facility] with SOB, fever, and worsening productive cough x 1 month. Note PMHx of PE on chronic warfarin anticoagulation.  Pharmacy consulted to resume warfarin inpatient.    Home dose warfarin: 2.5mg  daily except 3.75mg  on M/W/F, LD 1/17  Today, 07/23/2014  Warfarin dose 1/20 NOT given (pt was intubated and NPO, but did have OG tube access.)   INR = 2.53, remains therapeutic  CBC: Hgb = 7.4 - low/stable (admission = 8.3), plts wnl  FOBT negative 1/19  Diet: NPO (1/20), advance diet after extubation (1/21).  Low PO intake will reduce warfarin needs.  Drug interactions: steroids and antibiotics may reduce warfarin needs  Goal of Therapy:  INR 2-3 Monitor platelets by anticoagulation protocol: Yes   Plan:   Warfarin 1mg  PO x1 tonight at 1800 if tolerating PO intake  Rn to notify pharmacy if dose held.  Daily PT/INR  Monitor for bleeding   Gretta Arab PharmD, BCPS Pager [phone] 07/23/2014 11:15 AM

## 2014-07-23 NOTE — Progress Notes (Signed)
PULMONARY / CRITICAL CARE MEDICINE   Name: [name] MRN: [id number] DOB: 1932/12/24    ADMISSION DATE:  07/20/2014 CONSULTATION DATE:  07/21/14  REFERRING MD :  Dr. Allyson Sabal   CHIEF COMPLAINT:  Dyspnea, cough   INITIAL PRESENTATION: 81 y/o female patient of [name] with with granulomatous polyangitis and asthma was admitted on 1/19 with dyspnea, fever, wheeze.  STUDIES:  1/19  CT Chest >> thin membranous appearing structure in proximal trachea beneath the vocal cords of uncertain etiology, mild CHF, multiple nodules throughout lungs bilaterally L>R, no new large cavitary lesions  SIGNIFICANT EVENTS: 1/18  Admit with weight loss, fever 103, weakness & persistent cough 1/19  PCCM consulted for evaluation  1/20  Decompensated with stridor, to ICU for emergent intubation     SUBJECTIVE:  Breathing comfortably on SBT this morning  VITAL SIGNS: Temp:  [96.8 F (36 C)-99 F (37.2 C)] 98.1 F (36.7 C) (01/21 0600) Pulse Rate:  [66-110] 90 (01/21 0600) Resp:  [13-22] 20 (01/21 0600) BP: (82-134)/(43-81) 129/64 mmHg (01/21 0600) SpO2:  [97 %-100 %] 100 % (01/21 0600) FiO2 (%):  [40 %-100 %] 40 % (01/21 0600) Weight:  [134 lb 14.7 oz (61.2 kg)] 134 lb 14.7 oz (61.2 kg) (01/21 0400)   HEMODYNAMICS:     VENTILATOR SETTINGS: Vent Mode:  [-] PRVC FiO2 (%):  [40 %-100 %] 40 % Set Rate:  [14 bmp] 14 bmp Vt Set:  [400 mL-450 mL] 400 mL PEEP:  [5 cmH20] 5 cmH20 Plateau Pressure:  [15 cmH20-24 cmH20] 16 cmH20   INTAKE / OUTPUT:  Intake/Output Summary (Last 24 hours) at 07/23/14 0829 Last data filed at 07/23/14 0600  Gross per 24 hour  Intake 2041.4 ml  Output    575 ml  Net 1466.4 ml    PHYSICAL EXAMINATION: General:  Awake on vent, comfortable, writing questions HEENT: NCAT EOMi, ETT in place PULM: CTA B CV: RRR, no mgr AB: BS+, soft, nontender Ext: warm, no edema Neuro: Awake and alert  LABS:  CBC  Recent Labs Lab 07/21/14 0350 07/22/14 0700 07/23/14 0400   WBC 4.6 5.2 10.8*  HGB 7.8* 7.9* 7.4*  HCT 25.6* 26.5* 24.0*  PLT 478* 479* 384   Coag's  Recent Labs Lab 07/21/14 0350 07/22/14 0700 07/23/14 0400  INR 2.15* 2.35* 2.53*   BMET  Recent Labs Lab 07/21/14 0350 07/22/14 0700 07/23/14 0400  NA 140 138 141  K 3.7 4.0 4.0  CL 108 108 113*  CO2 25 24 24   BUN 6 12 19   CREATININE 0.67 0.66 0.70  GLUCOSE 105* 178* 140*   Electrolytes  Recent Labs Lab 07/20/14 1713 07/21/14 0350 07/22/14 0700 07/23/14 0400  CALCIUM  --  8.8 8.9 8.5  MG 2.0  --   --   --    Sepsis Markers  Recent Labs Lab 07/20/14 1334  LATICACIDVEN 1.63   ABG  Recent Labs Lab 07/22/14 1414  PHART 7.341*  PCO2ART 41.1  PO2ART 354.0*     Liver Enzymes  Recent Labs Lab 07/21/14 0350 07/22/14 0700 07/23/14 0400  AST 24 23 36  ALT 11 14 23   ALKPHOS 94 97 97  BILITOT 0.4 0.6 0.4  ALBUMIN 2.5* 2.7* 2.3*   Cardiac Enzymes  Recent Labs Lab 07/20/14 1612 07/20/14 1613 07/21/14 0350  TROPONINI 0.20* 0.22* 0.17*   Glucose  Recent Labs Lab 07/22/14 1422 07/22/14 1539 07/22/14 2014  GLUCAP 192* 159* 155*    Imaging Ct Maxillofacial W/cm  07/22/2014  CLINICAL DATA:  Wegner's granulomatosis. Question fungal sinusitis as cause of airway debris.  EXAM: CT MAXILLOFACIAL WITH CONTRAST  TECHNIQUE: Multidetector CT imaging of the maxillofacial structures was performed with intravenous contrast. Multiplanar CT image reconstructions were also generated. A small metallic BB was placed on the right temple in order to reliably differentiate right from left.  CONTRAST:  145mL OMNIPAQUE IOHEXOL 300 MG/ML  SOLN  COMPARISON:  05/08/2008  FINDINGS: Chronic pansinusitis status post medial maxillary antrostomies, bilateral turbinectomies, and ethmoidectomies. The right sphenoid sinus has also been opened. Mucosal thickening is diffuse throughout the sinuses and there are small layering effusions within the maxillary and right sphenoid sinuses. The  surgical openings are patent, although the right sphenoidotomy is moderately narrowed by mucosal disease. There is a small perforation in the anterior lower nasal septum. These changes are all consistent with history of Wegner's granulomatosis. Concerning the clinical question of fungal sinusitis, there certainly could be fungal colonization (some of the sinus material is high-density), but no fungal mass or invasive sinusitis is detected to explain the obstructive airway mass on bronchoscopy.  Complete opacification of the bilateral mastoid and middle ear cavities. No wrist changes.  Bilateral cataract resection.  Otherwise, the orbits are negative.  Limited evaluation of the oral cavity, oropharynx, and hypopharynx due to intubation with retained fluid.  IMPRESSION: 1. Wegner's granulomatosis with pansinusitis and otomastoiditis described above. 2. Concerning the clinical question, there may be sinus fungal colonization but there is no visible mycetoma or invasive sinusitis.   Electronically Signed   By: Jorje Guild M.D.   On: 07/22/2014 22:34   Dg Chest Port 1 View  07/22/2014   CLINICAL DATA:  Respiratory failure.  Endotracheal tube placement.  EXAM: PORTABLE CHEST - 1 VIEW  COMPARISON:  07/20/2014  FINDINGS: The endotracheal to is 3.5 cm above the carina. The NG tube is in the stomach. The cardiac silhouette, mediastinal and hilar contours are stable. Moderate pulmonary edema. Small pleural effusions.  IMPRESSION: The endotracheal tube and NG tubes are in good position.  Moderate pulmonary edema.   Electronically Signed   By: Kalman Jewels M.D.   On: 07/22/2014 14:21     ASSESSMENT / PLAN:  PULMONARY OETT 1/20 >>  A: Acute Respiratory Failure with Hypoxemia  Upper Airway Obstruction - possible obstruction with thin membranous structure in proximal trachea & quarter sized expectorated dark semi-solid fibrinous mass upon intubation.  Diffuse chronically inflamed appearing airways with bronch.   Suspect fungal infection given black, dark green color to mass extracted, likely related to fungal sinusitis (she has had this in the past) Scant Hemoptysis Sinusitis Hx Aspergillosis but galactomannan Ag negative on 1/18 Granulomatosis polyangitis but no clear evidence of active lung involvement 1/21 CT sinuses show sinusitis> worry this may be fungal P:   Wean to extubate this morning Continue Xopenex Q8 + Q6 PRN  Hold Dulera for now Continue solumedrol for now, plan to wean 1/21 May need ENT or ID consult, f/u special stains on path from tracheal mass sent   CARDIOVASCULAR CVL  A:  Tachycardia - in setting of respiratory distress, improved with intubation Mild CHF - noted on CT 1/20 PICC 1/21 P:  Supportive care KVO fluids  RENAL A:   No Acute Issues  P:   Monitor BMET and UOP Replace electrolytes as needed  GASTROINTESTINAL A:   No acute issues P:   PPI  Advance diet after extubation  HEMATOLOGIC A:   Wegener's Granulomatosis  Normocytic Anemia  Hx  PE  Coagulopathy - chronic coumadin  P:  Continue coumadin per pharmacy   INFECTIOUS A:   Suspect fungal sinusitis  GPC's in clusters 1/2 BC > likely contaminant [place]oubt HCAP P:   BCx2  1/18 >> GPC's in clusters 1/2 >>  UC 1/19 >> neg BAL 1/20 >> few GPC in clusters AFB (bronch) 1/20 >>  Fungal (bronch) 1/20 >>  Cytology (fibrinous mass) 1/20 >>  Culture (finbrinous mass) 1/20 >> few GPC in clusters Fungal (fibrinous mass) 1/20 >>  If special stains from path consistent with fungal infection then will consult ID  Vanco, start date 1/18, day 4/x Zosyn, start date 1/18, day 4/x  ENDOCRINE A:   Mild Hyperglycemia  P:   SSI, sensitive scale with steroids  NEUROLOGIC A:   Pain - vent associated, hx OA P:   RASS goal: -1 PAD 2 / Fentanyl gtt   FAMILY  - Updates: I have been updating her son-in law by phone and text messaging today and yesterday.   My cc time 35 minutes  Roselie Awkward,  MD [place] PCCM Pager: [phone] Cell: [phone] If no response, call [phone]

## 2014-07-23 NOTE — Procedures (Signed)
Extubation Procedure Note  Patient Details:   Name: [name] DOB: December 17, 1932 MRN: [id number]   Airway Documentation:  AIRWAYS (Active)    Evaluation  O2 sats: 27% Complications: none Patient tolerated procedure well. Bilateral Breath Sounds: Clear   Pt able to speak  Per CCM order, pt extubated and placed on nasal cannula.   Pt stable throughout the procedure, no complications.  Martha Clan 07/23/2014, 9:23 AM

## 2014-07-24 ENCOUNTER — Inpatient Hospital Stay (HOSPITAL_COMMUNITY): Payer: Medicare Other

## 2014-07-24 LAB — CBC WITH DIFFERENTIAL/PLATELET
Basophils Absolute: 0 10*3/uL (ref 0.0–0.1)
Basophils Relative: 0 % (ref 0–1)
EOS PCT: 0 % (ref 0–5)
Eosinophils Absolute: 0 10*3/uL (ref 0.0–0.7)
HCT: 26.8 % — ABNORMAL LOW (ref 36.0–46.0)
Hemoglobin: 8.1 g/dL — ABNORMAL LOW (ref 12.0–15.0)
LYMPHS ABS: 0.4 10*3/uL — AB (ref 0.7–4.0)
Lymphocytes Relative: 4 % — ABNORMAL LOW (ref 12–46)
MCH: 24.2 pg — ABNORMAL LOW (ref 26.0–34.0)
MCHC: 30.2 g/dL (ref 30.0–36.0)
MCV: 80 fL (ref 78.0–100.0)
MONO ABS: 0.3 10*3/uL (ref 0.1–1.0)
Monocytes Relative: 4 % (ref 3–12)
NEUTROS ABS: 8.1 10*3/uL — AB (ref 1.7–7.7)
NEUTROS PCT: 92 % — AB (ref 43–77)
Platelets: 421 10*3/uL — ABNORMAL HIGH (ref 150–400)
RBC: 3.35 MIL/uL — AB (ref 3.87–5.11)
RDW: 20.4 % — AB (ref 11.5–15.5)
WBC: 8.8 10*3/uL (ref 4.0–10.5)

## 2014-07-24 LAB — GLUCOSE, CAPILLARY
GLUCOSE-CAPILLARY: 172 mg/dL — AB (ref 70–99)
Glucose-Capillary: 125 mg/dL — ABNORMAL HIGH (ref 70–99)
Glucose-Capillary: 141 mg/dL — ABNORMAL HIGH (ref 70–99)
Glucose-Capillary: 143 mg/dL — ABNORMAL HIGH (ref 70–99)
Glucose-Capillary: 158 mg/dL — ABNORMAL HIGH (ref 70–99)
Glucose-Capillary: 210 mg/dL — ABNORMAL HIGH (ref 70–99)

## 2014-07-24 LAB — BASIC METABOLIC PANEL
Anion gap: 5 (ref 5–15)
BUN: 29 mg/dL — ABNORMAL HIGH (ref 6–23)
CHLORIDE: 114 meq/L — AB (ref 96–112)
CO2: 23 mmol/L (ref 19–32)
Calcium: 9 mg/dL (ref 8.4–10.5)
Creatinine, Ser: 0.82 mg/dL (ref 0.50–1.10)
GFR calc non Af Amer: 66 mL/min — ABNORMAL LOW (ref >90)
GFR, EST AFRICAN AMERICAN: 77 mL/min — AB (ref >90)
Glucose, Bld: 159 mg/dL — ABNORMAL HIGH (ref 70–99)
Potassium: 4.4 mmol/L (ref 3.5–5.1)
Sodium: 142 mmol/L (ref 135–145)

## 2014-07-24 LAB — PROTIME-INR
INR: 2.56 — ABNORMAL HIGH (ref 0.00–1.49)
PROTHROMBIN TIME: 27.7 s — AB (ref 11.6–15.2)

## 2014-07-24 MED ORDER — MOMETASONE FURO-FORMOTEROL FUM 200-5 MCG/ACT IN AERO
2.0000 | INHALATION_SPRAY | Freq: Two times a day (BID) | RESPIRATORY_TRACT | Status: DC
  Administered 2014-07-24 – 2014-07-30 (×12): 2 via RESPIRATORY_TRACT
  Filled 2014-07-24: qty 8.8

## 2014-07-24 MED ORDER — WARFARIN SODIUM 5 MG PO TABS
2.5000 mg | ORAL_TABLET | Freq: Once | ORAL | Status: AC
  Administered 2014-07-24: 2.5 mg via ORAL
  Filled 2014-07-24: qty 0.5

## 2014-07-24 NOTE — Progress Notes (Signed)
[name] for Warfarin Indication: Hx PE  Allergies  Allergen Reactions  . Adhesive [Tape] Rash    Patient Measurements: Height: 5\' 2"  (157.5 cm) Weight: 137 lb 2 oz (62.2 kg) IBW/kg (Calculated) : 50.1  Vital Signs: Temp: 99 F (37.2 C) (01/22 0900) Temp Source: Core (Comment) (01/22 0700) BP: 150/86 mmHg (01/22 0900) Pulse Rate: 88 (01/22 0900)  Labs:  Recent Labs  07/22/14 0700 07/23/14 0400 07/24/14 0420  HGB 7.9* 7.4* 8.1*  HCT 26.5* 24.0* 26.8*  PLT 479* 384 421*  LABPROT 26.0* 27.5* 27.7*  INR 2.35* 2.53* 2.56*  CREATININE 0.66 0.70 0.82    Estimated Creatinine Clearance: 48.2 mL/min (by C-G formula based on Cr of 0.82).  Assessment: 65 [facility]F [facility] SOB, fever, and worsening productive cough x 1 month. Note PMHx of PE on chronic warfarin anticoagulation.  Pharmacy consulted to resume warfarin inpatient.    Home dose warfarin: 2.5mg  daily except 3.75mg  on M/W/F, LD 1/17  Today, 07/24/2014  Warfarin dose 1/20 NOT given (pt was intubated and NPO, but did have OG tube access.)   Pt extubated this morning  INR therapeutic  CBC: Hgb improved to 8.1,, plts wnl  FOBT negative 1/19   Diet: Advanced to regular today  Drug interactions: steroids may reduce warfarin needs  Goal of Therapy:  INR 2-3 Monitor platelets by anticoagulation protocol: Yes   Plan:   Warfarin 2.5mg  PO x1 tonight at 1800  Daily PT/INR  Monitor for bleeding  Ralene Bathe, PharmD, BCPS 07/24/2014, 10:57 AM  Pager: [phone]

## 2014-07-24 NOTE — Progress Notes (Signed)
Family has requested that patient's low Hgb be addressed. They realize that her breathing was a priority, they just don't want her Hgb issues to be forgotten. MD; please address with the family tomorrow, 07/25/14. Thank you.

## 2014-07-24 NOTE — Consult Note (Signed)
Patient admitted because of respiratory problems requiring intubation. At time of intubation patient had mass/mucous extending from the nasopharynx to subglottis which was removed. Patient now extubated with slight hoarseness but good sats on nasal cannula O2. Hx of Wegeners well known to me . EXAM Oral exam clear, oropharynx clear Nasal endoscopy reveals clear nasal passages, no obvious fungal disease noted. No necrotic tissue visualized. Fair amount of dry crusting tissue FOL  Nasopharynx clear, BOT and epiglottis clear,  Vocal cords clear with normal mobility, glottis widely patent         Subglottis can visualize some persistent dark scabs and crusting on the side walls of the trachea  IMP  Wegener's Disease with chronic sinus problems          No clinical evidence of significant fungal disease  REC Maximize humidity to diminish upper airway crusting         Face shield with humidified O2 may be more effective than nasal cannula O2          Reviewed with patient concerning Saline nasal sinus irrigations at home and follow up at my office a week post discharge  Can call office for appt     [phone]

## 2014-07-24 NOTE — Progress Notes (Signed)
PULMONARY / CRITICAL CARE MEDICINE   Name: [name] MRN: [id number] DOB: January 29, 1933    ADMISSION DATE:  07/20/2014 CONSULTATION DATE:  07/21/14  REFERRING MD :  Dr. Allyson Sabal   CHIEF COMPLAINT:  Dyspnea, cough   INITIAL PRESENTATION: 81 y/o female patient of [name] with with granulomatous polyangitis and asthma was admitted on 1/19 with dyspnea, fever, wheeze.  STUDIES:  1/19  CT Chest >> thin membranous appearing structure in proximal trachea beneath the vocal cords of uncertain etiology, mild CHF, multiple nodules throughout lungs bilaterally L>R, no new large cavitary lesions  SIGNIFICANT EVENTS: 1/18  Admit with weight loss, fever 103, weakness & persistent cough 1/19  PCCM consulted for evaluation  1/20  Decompensated with stridor, to ICU for emergent intubation > large black fibrinous mass removed from trachea 1/22  Extubated   SUBJECTIVE:  Extubated, hoarse, breathing comfortably  VITAL SIGNS: Temp:  [97.5 F (36.4 C)-99.9 F (37.7 C)] 98.6 F (37 C) (01/22 0300) Pulse Rate:  [76-111] 96 (01/22 0300) Resp:  [11-24] 17 (01/22 0300) BP: (104-151)/(48-82) 151/82 mmHg (01/22 0300) SpO2:  [91 %-99 %] 97 % (01/22 0300) FiO2 (%):  [40 %] 40 % (01/21 0900) Weight:  [62.2 kg (137 lb 2 oz)] 62.2 kg (137 lb 2 oz) (01/22 0404)   HEMODYNAMICS:     VENTILATOR SETTINGS: Vent Mode:  [-] Other (Comment) FiO2 (%):  [40 %] 40 % PEEP:  [5 cmH20] 5 cmH20 Pressure Support:  [5 cmH20] 5 cmH20   INTAKE / OUTPUT:  Intake/Output Summary (Last 24 hours) at 07/24/14 1245 Last data filed at 07/24/14 0700  Gross per 24 hour  Intake 2415.75 ml  Output    640 ml  Net 1775.75 ml    PHYSICAL EXAMINATION: General:  Breathing comfortably, audible stridor HEENT: NCAT EOMi, ETT in place PULM: Rhonchi LLL, RUL CV: RRR, no mgr AB: BS+, soft, nontender Ext: warm, no edema Neuro: Awake and alert, pleasant mood  LABS:  CBC  Recent Labs Lab 07/22/14 0700 07/23/14 0400  07/24/14 0420  WBC 5.2 10.8* 8.8  HGB 7.9* 7.4* 8.1*  HCT 26.5* 24.0* 26.8*  PLT 479* 384 421*   Coag's  Recent Labs Lab 07/22/14 0700 07/23/14 0400 07/24/14 0420  INR 2.35* 2.53* 2.56*   BMET  Recent Labs Lab 07/22/14 0700 07/23/14 0400 07/24/14 0420  NA 138 141 142  K 4.0 4.0 4.4  CL 108 113* 114*  CO2 24 24 23   BUN 12 19 29*  CREATININE 0.66 0.70 0.82  GLUCOSE 178* 140* 159*   Electrolytes  Recent Labs Lab 07/20/14 1713  07/22/14 0700 07/23/14 0400 07/24/14 0420  CALCIUM  --   < > 8.9 8.5 9.0  MG 2.0  --   --   --   --   < > = values in this interval not displayed. Sepsis Markers  Recent Labs Lab 07/20/14 1334  LATICACIDVEN 1.63   ABG  Recent Labs Lab 07/22/14 1414  PHART 7.341*  PCO2ART 41.1  PO2ART 354.0*     Liver Enzymes  Recent Labs Lab 07/21/14 0350 07/22/14 0700 07/23/14 0400  AST 24 23 36  ALT 11 14 23   ALKPHOS 94 97 97  BILITOT 0.4 0.6 0.4  ALBUMIN 2.5* 2.7* 2.3*   Cardiac Enzymes  Recent Labs Lab 07/20/14 1612 07/20/14 1613 07/21/14 0350  TROPONINI 0.20* 0.22* 0.17*   Glucose  Recent Labs Lab 07/23/14 0747 07/23/14 1216 07/23/14 1632 07/23/14 1928 07/23/14 2327 07/24/14 0358  GLUCAP 141*  132* 173* 151* 141* 143*    Imaging Dg Chest Port 1 View  07/23/2014   CLINICAL DATA:  Acute respiratory failure  EXAM: PORTABLE CHEST - 1 VIEW  COMPARISON:  07/22/2014  FINDINGS: Cardiac shadow is stable. An endotracheal tube, left-sided PICC line and nasogastric catheter are stable in appearance. Increasing density is noted in the left retrocardiac region consistent with atelectasis. Diffuse vascular congestion is again identified.  IMPRESSION: Increasing left lower lobe atelectasis. The remainder of the exam is stable.   Electronically Signed   By: Inez Catalina M.D.   On: 07/23/2014 07:13     ASSESSMENT / PLAN:  PULMONARY OETT 1/20 >>  A: Acute Respiratory Failure with Hypoxemia > due to fibrinous mass (fungal?)  resolved Stridor> still mild 1/22 AM possibly related to ETT, improved from pre-intubation Sinusitis > see previous notes' discussion Hx Aspergillosis but galactomannan Ag negative on 1/18 Granulomatosis polyangitis but no clear evidence of active lung involvement 1/21 Asthma, not in exacerbation P:   Continue solumedrol for mild stridor If stridor worse or special stains from path do not show fungal involvement> call ENT to inspect maxillary sinuses for culture and eval for fungal sinusitis Continue Xopenex Q8 + Q6 PRN  Restart Dulera  Continue solumedrol for now, plan to wean 1/21 May need ENT or ID consult, f/u special stains on path from tracheal mass sent   CARDIOVASCULAR CVL  A:  Tachycardia - resolved PICC 1/21 P:  Supportive care KVO fluids  RENAL A:   No Acute Issues  P:   Monitor BMET and UOP Replace electrolytes as needed  GASTROINTESTINAL A:   No acute issues P:   PPI  Advance diet regular  HEMATOLOGIC A:   Wegener's Granulomatosis  Normocytic Anemia  Hx PE  Coagulopathy - chronic coumadin  P:  Continue coumadin per pharmacy   INFECTIOUS A:   Suspect fungal sinusitis  Staph species 1/2 BC > likely contaminant [place]oubt HCAP P:   BCx2  1/18 >> few staph species 1/2 >>  UC 1/19 >> neg BAL 1/20 >> few GPC in clusters AFB (bronch) 1/20 >>  Fungal (bronch) 1/20 >>  Cytology (fibrinous mass) 1/20 >>  Culture (finbrinous mass) 1/20 >> few GPC in clusters Fungal (fibrinous mass) 1/20 >>  If special stains from path consistent with fungal infection then will consult ID  Vanco, start date 1/18, day 5/5 (Stop) Zosyn, start date 1/18, day 5/5 (Stop)  ENDOCRINE A:   Mild Hyperglycemia  P:   SSI, sensitive scale with steroids  NEUROLOGIC A:   No acute issues P:   Ambulate, out of bed  FAMILY  - Updates: I have been updating her son-in law by phone and text messaging as of 1/21, daughter in town 1/22 will update bedside if/when she  arrives  Move to floor, back to [facility], PCCM will follow over weekend  Roselie Awkward, MD [place] PCCM Pager: [phone] Cell: [phone] If no response, call [phone]

## 2014-07-24 NOTE — Progress Notes (Signed)
[facility] PCCM  Called back to bedside Patient had several severe coughing spells with some hypoxemia, now better that mucus cleared Maintain in SDU overnight, TRH service 1/23 but PCCM to follow ENT consulted per family/patient request  Roselie Awkward, MD [place] PCCM Pager: [phone] Cell: [phone] If no response, call [phone]

## 2014-07-24 NOTE — Progress Notes (Signed)
Spiritual care working with family to provide housing at [employer].  Provided bedside support with pt's daughter and granddaughter.   [place], [place]

## 2014-07-25 LAB — BLOOD GAS, ARTERIAL
Acid-base deficit: 4.9 mmol/L — ABNORMAL HIGH (ref 0.0–2.0)
BICARBONATE: 19.6 meq/L — AB (ref 20.0–24.0)
Drawn by: 225631
FIO2: 0.6 %
O2 SAT: 99.6 %
PATIENT TEMPERATURE: 37
TCO2: 18.6 mmol/L (ref 0–100)
pCO2 arterial: 36.1 mmHg (ref 35.0–45.0)
pH, Arterial: 7.354 (ref 7.350–7.450)
pO2, Arterial: 193 mmHg — ABNORMAL HIGH (ref 80.0–100.0)

## 2014-07-25 LAB — PROTIME-INR
INR: 2.6 — ABNORMAL HIGH (ref 0.00–1.49)
Prothrombin Time: 28.1 seconds — ABNORMAL HIGH (ref 11.6–15.2)

## 2014-07-25 LAB — CBC
HCT: 25.2 % — ABNORMAL LOW (ref 36.0–46.0)
HEMOGLOBIN: 7.7 g/dL — AB (ref 12.0–15.0)
MCH: 24.1 pg — ABNORMAL LOW (ref 26.0–34.0)
MCHC: 30.6 g/dL (ref 30.0–36.0)
MCV: 79 fL (ref 78.0–100.0)
PLATELETS: 389 10*3/uL (ref 150–400)
RBC: 3.19 MIL/uL — ABNORMAL LOW (ref 3.87–5.11)
RDW: 20 % — ABNORMAL HIGH (ref 11.5–15.5)
WBC: 8.2 10*3/uL (ref 4.0–10.5)

## 2014-07-25 LAB — CULTURE, RESPIRATORY

## 2014-07-25 LAB — GLUCOSE, CAPILLARY
GLUCOSE-CAPILLARY: 142 mg/dL — AB (ref 70–99)
Glucose-Capillary: 177 mg/dL — ABNORMAL HIGH (ref 70–99)
Glucose-Capillary: 122 mg/dL — ABNORMAL HIGH (ref 70–99)
Glucose-Capillary: 204 mg/dL — ABNORMAL HIGH (ref 70–99)
Glucose-Capillary: 195 mg/dL — ABNORMAL HIGH (ref 70–99)
Glucose-Capillary: 172 mg/dL — ABNORMAL HIGH (ref 70–99)

## 2014-07-25 LAB — CULTURE, RESPIRATORY W GRAM STAIN

## 2014-07-25 MED ORDER — VANCOMYCIN HCL IN DEXTROSE 750-5 MG/150ML-% IV SOLN
750.0000 mg | Freq: Two times a day (BID) | INTRAVENOUS | Status: DC
  Administered 2014-07-25 – 2014-07-27 (×4): 750 mg via INTRAVENOUS
  Filled 2014-07-25 (×7): qty 150

## 2014-07-25 MED ORDER — METHYLPREDNISOLONE SODIUM SUCC 40 MG IJ SOLR
40.0000 mg | Freq: Two times a day (BID) | INTRAMUSCULAR | Status: DC
  Administered 2014-07-25 – 2014-07-26 (×3): 40 mg via INTRAVENOUS
  Filled 2014-07-25 (×3): qty 1

## 2014-07-25 MED ORDER — PANTOPRAZOLE SODIUM 40 MG PO TBEC
40.0000 mg | DELAYED_RELEASE_TABLET | Freq: Two times a day (BID) | ORAL | Status: DC
  Administered 2014-07-25 – 2014-07-30 (×10): 40 mg via ORAL
  Filled 2014-07-25 (×14): qty 1

## 2014-07-25 MED ORDER — WARFARIN SODIUM 5 MG PO TABS
2.5000 mg | ORAL_TABLET | Freq: Once | ORAL | Status: AC
  Administered 2014-07-25: 2.5 mg via ORAL
  Filled 2014-07-25: qty 0.5

## 2014-07-25 MED ORDER — LORAZEPAM 2 MG/ML IJ SOLN
1.0000 mg | Freq: Once | INTRAMUSCULAR | Status: AC
  Administered 2014-07-25: 1 mg via INTRAVENOUS

## 2014-07-25 MED ORDER — LATANOPROST 0.005 % OP SOLN
1.0000 [drp] | Freq: Every day | OPHTHALMIC | Status: DC
  Administered 2014-07-25 – 2014-07-29 (×5): 1 [drp] via OPHTHALMIC
  Filled 2014-07-25 (×2): qty 2.5

## 2014-07-25 MED ORDER — RACEPINEPHRINE HCL 2.25 % IN NEBU: 0.5000 mL | INHALATION_SOLUTION | Freq: Once | RESPIRATORY_TRACT | Status: DC

## 2014-07-25 MED ORDER — TIMOLOL MALEATE 0.5 % OP SOLN
1.0000 [drp] | Freq: Every day | OPHTHALMIC | Status: DC
  Administered 2014-07-25 – 2014-07-30 (×6): 1 [drp] via OPHTHALMIC
  Filled 2014-07-25 (×3): qty 5

## 2014-07-25 MED ORDER — SALINE SPRAY 0.65 % NA SOLN
1.0000 | Freq: Two times a day (BID) | NASAL | Status: DC
  Administered 2014-07-25 – 2014-07-30 (×11): 1 via NASAL
  Filled 2014-07-25: qty 44

## 2014-07-25 NOTE — Progress Notes (Signed)
ANTIBIOTIC CONSULT NOTE - INITIAL  Pharmacy Consult for Vancomycin Indication: HCAP/MRSA in trach aspirate, BAL  Allergies  Allergen Reactions  . Adhesive [Tape] Rash    Patient Measurements: Height: 5\' 2"  (157.5 cm) Weight: 144 lb 2.9 oz (65.4 kg) IBW/kg (Calculated) : 50.1   Vital Signs: Temp: 98.8 F (37.1 C) (01/23 1400) Temp Source: Core (Comment) (01/23 0900) BP: 158/74 mmHg (01/23 1400) Pulse Rate: 78 (01/23 1400) Intake/Output from previous day: 01/22 0701 - 01/23 0700 In: 2025 [I.V.:2025] Out: 750 [Urine:750] Intake/Output from this shift: Total I/O In: 595 [I.V.:595] Out: 275 [Urine:275]  Labs:  Recent Labs  07/23/14 0400 07/24/14 0420 07/25/14 1038  WBC 10.8* 8.8 8.2  HGB 7.4* 8.1* 7.7*  PLT 384 421* 389  CREATININE 0.70 0.82  --    Estimated Creatinine Clearance: 49.4 mL/min (by C-G formula based on Cr of 0.82).  Recent Labs  07/23/14 0300 07/23/14 0400  VANCOTROUGH QUESTIONABLE RESULTS, RECOMMEND RECOLLECT TO VERIFY 12.6     Microbiology: Recent Results (from the past 720 hour(s))  Culture, blood (routine x 2)     Status: None   Collection Time: 07/20/14  1:20 PM  Result Value Ref Range Status   Specimen Description BLOOD RIGHT ANTECUBITAL  Final   Special Requests BOTTLES DRAWN AEROBIC AND ANAEROBIC 3ML  Final   Culture   Final    STAPHYLOCOCCUS SPECIES (COAGULASE NEGATIVE) Note: THE SIGNIFICANCE OF ISOLATING THIS ORGANISM FROM A SINGLE SET OF BLOOD CULTURES WHEN MULTIPLE SETS ARE DRAWN IS UNCERTAIN. PLEASE NOTIFY THE MICROBIOLOGY DEPARTMENT WITHIN ONE WEEK IF SPECIATION AND SENSITIVITIES ARE REQUIRED. Note: Gram Stain Report Called to,Read Back By and Verified With: JENNIFER HUFF 07/22/14 AT 1150 AM BY [facility] Performed at [facility]    Report Status 07/23/2014 FINAL  Final  Culture, blood (routine x 2)     Status: None (Preliminary result)   Collection Time: 07/20/14  1:22 PM  Result Value Ref Range Status   Specimen  Description BLOOD RIGHT FOREARM  Final   Special Requests BOTTLES DRAWN AEROBIC AND ANAEROBIC 3ML  Final   Culture   Final           BLOOD CULTURE RECEIVED NO GROWTH TO DATE CULTURE WILL BE HELD FOR 5 DAYS BEFORE ISSUING A FINAL NEGATIVE REPORT Performed at [facility]    Report Status PENDING  Incomplete  Urine culture     Status: None   Collection Time: 07/20/14  1:40 PM  Result Value Ref Range Status   Specimen Description URINE, CLEAN CATCH  Final   Special Requests NONE  Final   Colony Count NO GROWTH Performed at [facility]   Final   Culture NO GROWTH Performed at [facility]   Final   Report Status 07/21/2014 FINAL  Final  Culture, respiratory (NON-Expectorated)     Status: None   Collection Time: 07/22/14 12:56 PM  Result Value Ref Range Status   Specimen Description TRACHEAL ASPIRATE  Final   Special Requests NONE  Final   Gram Stain   Final    RARE WBC PRESENT, PREDOMINANTLY MONONUCLEAR RARE SQUAMOUS EPITHELIAL CELLS PRESENT FEW GRAM POSITIVE COCCI IN PAIRS IN CLUSTERS Performed at [facility]    Culture   Final    ABUNDANT METHICILLIN RESISTANT STAPHYLOCOCCUS AUREUS Note: RIFAMPIN AND GENTAMICIN SHOULD NOT BE USED AS SINGLE DRUGS FOR TREATMENT OF STAPH INFECTIONS. CRITICAL RESULT CALLED TO, READ BACK BY AND VERIFIED WITH: Clent Ridges RN [id number] 07/25/14 GUS Performed at  [facility]    Report Status 07/25/2014 FINAL  Final   Organism ID, Bacteria METHICILLIN RESISTANT STAPHYLOCOCCUS AUREUS  Final      Susceptibility   Methicillin resistant staphylococcus aureus - MIC*    CLINDAMYCIN >=8 RESISTANT Resistant     ERYTHROMYCIN >=8 RESISTANT Resistant     GENTAMICIN <=0.5 SENSITIVE Sensitive     LEVOFLOXACIN >=8 RESISTANT Resistant     OXACILLIN >=4 RESISTANT Resistant     PENICILLIN >=0.5 RESISTANT Resistant     RIFAMPIN <=0.5 SENSITIVE Sensitive     TRIMETH/SULFA >=320 RESISTANT Resistant     VANCOMYCIN 2 SENSITIVE  Sensitive     TETRACYCLINE >=16 RESISTANT Resistant     * ABUNDANT METHICILLIN RESISTANT STAPHYLOCOCCUS AUREUS  Fungus Culture with Smear     Status: None (Preliminary result)   Collection Time: 07/22/14  1:05 PM  Result Value Ref Range Status   Specimen Description LUNG  Final   Special Requests NONE  Final   Fungal Smear   Final    NO YEAST OR FUNGAL ELEMENTS SEEN Performed at [facility]    Culture   Final    CULTURE IN PROGRESS FOR FOUR WEEKS Performed at [facility]    Report Status PENDING  Incomplete  MRSA PCR Screening     Status: None   Collection Time: 07/22/14  1:07 PM  Result Value Ref Range Status   MRSA by PCR NEGATIVE NEGATIVE Final    Comment:        The GeneXpert MRSA Assay (FDA approved for NASAL specimens only), is one component of a comprehensive MRSA colonization surveillance program. It is not intended to diagnose MRSA infection nor to guide or monitor treatment for MRSA infections.   Culture, respiratory (NON-Expectorated)     Status: None   Collection Time: 07/22/14  1:25 PM  Result Value Ref Range Status   Specimen Description BRONCHIAL ALVEOLAR LAVAGE  Final   Special Requests Immunocompromised  Final   Gram Stain   Final    FEW WBC PRESENT, PREDOMINANTLY MONONUCLEAR RARE SQUAMOUS EPITHELIAL CELLS PRESENT FEW GRAM POSITIVE COCCI IN PAIRS IN CLUSTERS Performed at [facility]    Culture   Final    FEW METHICILLIN RESISTANT STAPHYLOCOCCUS AUREUS Note: RIFAMPIN AND GENTAMICIN SHOULD NOT BE USED AS SINGLE DRUGS FOR TREATMENT OF STAPH INFECTIONS. CRITICAL RESULT CALLED TO, READ BACK BY AND VERIFIED WITH: Clent Ridges RN [id number] 07/25/14 GUSTK Performed at [facility]    Report Status 07/25/2014 FINAL  Final   Organism ID, Bacteria METHICILLIN RESISTANT STAPHYLOCOCCUS AUREUS  Final      Susceptibility   Methicillin resistant staphylococcus aureus - MIC*    CLINDAMYCIN >=8 RESISTANT Resistant     ERYTHROMYCIN >=8  RESISTANT Resistant     GENTAMICIN <=0.5 SENSITIVE Sensitive     LEVOFLOXACIN >=8 RESISTANT Resistant     OXACILLIN >=4 RESISTANT Resistant     PENICILLIN >=0.5 RESISTANT Resistant     RIFAMPIN <=0.5 SENSITIVE Sensitive     TRIMETH/SULFA >=320 RESISTANT Resistant     VANCOMYCIN 2 SENSITIVE Sensitive     TETRACYCLINE >=16 RESISTANT Resistant     * FEW METHICILLIN RESISTANT STAPHYLOCOCCUS AUREUS  Fungus Culture with Smear     Status: None (Preliminary result)   Collection Time: 07/22/14  1:27 PM  Result Value Ref Range Status   Specimen Description BRONCHIAL ALVEOLAR LAVAGE  Final   Special Requests Immunocompromised  Final   Fungal Smear   Final  NO YEAST OR FUNGAL ELEMENTS SEEN Performed at [facility]    Culture   Final    CULTURE IN PROGRESS FOR FOUR WEEKS Performed at [facility]    Report Status PENDING  Incomplete  AFB culture with smear     Status: None (Preliminary result)   Collection Time: 07/22/14  1:27 PM  Result Value Ref Range Status   Specimen Description BRONCHIAL ALVEOLAR LAVAGE  Final   Special Requests Immunocompromised  Final   Acid Fast Smear   Final    NO ACID FAST BACILLI SEEN Performed at [facility]    Culture   Final    CULTURE WILL BE EXAMINED FOR 6 WEEKS BEFORE ISSUING A FINAL REPORT Performed at [facility]    Report Status PENDING  Incomplete    Medical History: Past Medical History  Diagnosis Date  . Wegener's granulomatosis   . OA (osteoarthritis)   . Blindness of one eye   . Hearing loss in left ear     hearing aid both ears  . Bladder incontinence   . Glaucoma   . Clotting disorder   . History of pulmonary embolism 1997  . Shortness of breath dyspnea   . Pneumonia 12/15  . Multiple allergies   . Chronic sinusitis    Assessment: 73 yoF with PMH of Wegener's granulomatosis, Aspergillosis, asthma who presented to [facility] on 1/18 with SOB, fever, and worsening productive cough x 1 month.  Patient  treated x 5 days with Vancomycin and Zosyn for possible HCAP, last doses on 1/22. Large black fibrinous removed from trachea 1/20. Trach aspirate and BAL now with MRSA.  Pharmacy consulted to restart Vancomycin in this patient.    1/18 >> Vancomycin >> 1/22 1/18 >> Zosyn >> 1/22 1/23 >> Vancomycin >>  Tmax: 99.7 WBCs: WNL Renal: SCr 0.82, CrCl 49 mL/min CG  1/18 blood: 1/2 CoNS-? contaminant 1/18 urine: NGF 1/18 influenza panel: neg 1/18: aspergillus galactomanan: negative 1/20 MRSA PCR: negative 1/20 BAL, Tracheal aspirate: + MRSA, no yeast or fungal elements seen, no acid fast bacilli seen  Goal of Therapy:  Vancomycin trough level 15-20 mcg/ml  Eradication of infection  Plan:   Vancomycin 750 mg IV q12h  Follow up renal fxn, cultures, vancomycin trough as warranted, clinical course   Lindell Spar, PharmD, BCPS Pager: [phone] 07/25/2014 6:19 PM

## 2014-07-25 NOTE — Progress Notes (Signed)
Text-paged MD Abrol to make aware patient's tracheal aspirate and broncho lavage both positive for MRSA.

## 2014-07-25 NOTE — Progress Notes (Signed)
Beginning of shift patient complaining unable to breathe. Saturation dropped into low 80's with fask tent. Switched patient over to NRB. Patient appearing very anxious. HR 135-150's, RR 29, 99% on mask. 1mg  Ativan given, Respiratory at bedside and MD paged.

## 2014-07-25 NOTE — Progress Notes (Signed)
[facility] PROGRESS NOTE  [name] WLN:[id number] DOB: March 01, 1933 DOA: 07/20/2014 PCP:  [name], MD  Assessment/Plan: Principal Problem:   HCAP (healthcare-associated pneumonia) Active Problems:   Dyspnea   Protein-calorie malnutrition, severe   Acute respiratory failure with hypoxia    Acute Respiratory Failure with Hypoxemia > slow to improve, status post extubation 1/22 Respiratory failure secondary to  fibrinous mass (fungal?) resolved Stridor> still occuring 1/23 am, seems to be associated with secretion management and also anxiety. 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 He recommends to maximize humidity, Face shield with humidified oxygen Continue Xopenex Q8 + Q6 PRN  Continue Dulera , continue raceepinephrine Continue solumedrol for now, plan to wean again 1/23 Fungal culture, AFB, bronchoalveolar lavage culture no growth so far, continue to follow results Antibiotics discontinued   Sinus tachycardia-improved Continue gentle hydration   GI prophylaxis-switch Protonix to by mouth    Normocytic Anemia ,Hx PE on chronic anticoagulation FOBT negative Continue coumadin per pharmacy  Follow CBC    Suspect fungal sinusitis  Staph species 1/2 BC > likely contaminant Doubt HCAP BCx2 1/18 >> few coag neg staph species 1/2  UC 1/19 >> neg BAL 1/20 >> few GPC in clusters AFB (bronch) 1/20 >>  Fungal (bronch) 1/20 >>  Cytology (fibrinous mass) 1/20 >>  Culture (finbrinous mass) 1/20 >> few GPC in clusters Fungal (fibrinous mass) 1/20 >>  If special stains from path  consistent with fungal infection then will consult ID, no evidence of this yet  Vanco, start date 1/18, day 5/5 (Stopped) Zosyn, start date 1/18, day 5/5 (Stopped)   Hyperglycemia , steroid-induced SSI, sensitive scale with steroids   Generalized weakness Start ambulating patient with physical therapy once FiO2 is tapered down to maintain saturation greater than 92%   Code Status: full Family Communication: family updated about patient's clinical progress Disposition Plan:  Continue stepdown    Brief narrative: 81 year old female from SNF, history of Wegener's granulomatosis followed by [name], asthma who presents to the ER because of worsening shortness of breath, fever 103. The patient has not been feeling well for a couple of months gradually worsening over the last 3-4 weeks. The patient states that 2 months ago she was diagnosed with anemia, hemoglobin significantly lower than baseline. She did not have any extensive workup other than been provided with a referral to see [name]. This consult has not taken place as of yet. She denies any hematochezia, melena. She describes 15 pound weight loss in the last 2-3 years. She subsequently fell ill Christmas Eve, so her PCP office and he placed her on levofloxacin for 7 days for suspected pneumonia. Patient was subsequently seen at urgent care on 1/8 for weakness and persistent cough rx sx . In the ED the patient was found to have a normal white count of 5.4. Chest x-ray shows emphysema with increased interstitial markings  Consultants:  Pulmonary critical care  ENT  STUDIES:  1/19 CT Chest >> thin membranous appearing structure in proximal trachea beneath the vocal cords of uncertain etiology, mild CHF, multiple nodules throughout lungs bilaterally L>R, no new large cavitary lesions  SIGNIFICANT EVENTS: 1/18 Admit with weight loss, fever  103, weakness & persistent cough 1/19 PCCM consulted for evaluation  1/20  Decompensated with stridor, to ICU for emergent intubation > large black fibrinous mass removed from trachea 1/22 Extubated   Antibiotics: Anti-infectives:@  HPI/Subjective: Patient is very weak, speaking slow sentences, Had an episode of acute dyspnea, stridor and difficulty clearing secretions this am when she woke up. She cleared some mucous, received ativan, improved  Objective: Filed Vitals:   07/25/14 0600 07/25/14 0748 07/25/14 0800 07/25/14 0900  BP: 180/80   141/78  Pulse: 80  116 89  Temp: 98.6 F (37 C)  98.4 F (36.9 C) 97.7 F (36.5 C)  TempSrc:   Core (Comment) Core (Comment)  Resp: 18  23 21   Height:      Weight:      SpO2: 100% 100% 100% 92%    Intake/Output Summary (Last 24 hours) at 07/25/14 1033 Last data filed at 07/25/14 0900  Gross per 24 hour  Intake   1870 ml  Output    750 ml  Net   1120 ml    Exam:  General: alert & oriented x 3, weak cough, generally weak Cardiovascular: RRR, nl S1 s2  Respiratory: Decreased breath sounds at the bases, scattered rhonchi, no crackles  Abdomen: soft +BS NT/ND, no masses palpable  Extremities: No cyanosis and no edema      Data Reviewed: Basic Metabolic Panel:  Recent Labs Lab 07/20/14 1320 07/20/14 1612 07/20/14 1713 07/21/14 0350 07/22/14 0700 07/23/14 0400 07/24/14 0420  NA 135  --   --  140 138 141 142  K 3.9  --   --  3.7 4.0 4.0 4.4  CL 105  --   --  108 108 113* 114*  CO2 24  --   --  25 24 24 23   GLUCOSE 121*  --   --  105* 178* 140* 159*  BUN 10  --   --  6 12 19  29*  CREATININE 0.62 0.64  --  0.67 0.66 0.70 0.82  CALCIUM 8.9  --   --  8.8 8.9 8.5 9.0  MG  --   --  2.0  --   --   --   --     Liver Function Tests:  Recent Labs Lab 07/20/14 1713 07/21/14 0350 07/22/14 0700 07/23/14 0400  AST 22 24 23  36  ALT 12 11 14 23   ALKPHOS 100 94 97 97  BILITOT 0.4 0.4 0.6 0.4  PROT 5.7* 5.4* 5.9* 5.0*  ALBUMIN 2.8* 2.5* 2.7* 2.3*   No results for input(s): LIPASE, AMYLASE in the  last 168 hours. No results for input(s): AMMONIA in the last 168 hours.  CBC:  Recent Labs Lab 07/20/14 1713 07/21/14 0350 07/22/14 0700 07/23/14 0400 07/24/14 0420  WBC 4.9 4.6 5.2 10.8* 8.8  NEUTROABS  --   --   --   --  8.1*  HGB 8.3* 7.8* 7.9* 7.4* 8.1*  HCT 26.7* 25.6* 26.5* 24.0* 26.8*  MCV 79.5 78.8 78.9 80.3 80.0  PLT 469* 478* 479* 384 421*    Cardiac Enzymes:  Recent Labs Lab 07/20/14 1320 07/20/14 1612 07/20/14 1613 07/21/14 0350  TROPONINI 0.04* 0.20* 0.22* 0.17*   BNP (last 3 results) No results for input(s): PROBNP in the last 8760 hours.   CBG:  Recent Labs Lab 07/24/14 1650 07/24/14 2014 07/24/14 2344 07/25/14 0339 07/25/14 0754  GLUCAP 172* 210* 158* 142* 177*    Recent Results (from the past 240 hour(s))  Culture, blood (  routine x 2)     Status: None   Collection Time: 07/20/14  1:20 PM  Result Value Ref Range Status   Specimen Description BLOOD RIGHT ANTECUBITAL  Final   Special Requests BOTTLES DRAWN AEROBIC AND ANAEROBIC 3ML  Final   Culture   Final    STAPHYLOCOCCUS SPECIES (COAGULASE NEGATIVE) Note: THE SIGNIFICANCE OF ISOLATING THIS ORGANISM FROM A SINGLE SET OF BLOOD CULTURES WHEN MULTIPLE SETS ARE DRAWN IS UNCERTAIN. PLEASE NOTIFY THE MICROBIOLOGY DEPARTMENT WITHIN ONE WEEK IF SPECIATION AND SENSITIVITIES ARE REQUIRED. Note: Gram Stain Report Called to,Read Back By and Verified With: JENNIFER HUFF 07/22/14 AT 1150 AM BY [facility] Performed at [facility]    Report Status 07/23/2014 FINAL  Final  Culture, blood (routine x 2)     Status: None (Preliminary result)   Collection Time: 07/20/14  1:22 PM  Result Value Ref Range Status   Specimen Description BLOOD RIGHT FOREARM  Final   Special Requests BOTTLES DRAWN AEROBIC AND ANAEROBIC 3ML  Final   Culture   Final           BLOOD CULTURE RECEIVED NO GROWTH TO DATE CULTURE WILL BE HELD FOR 5 DAYS BEFORE ISSUING A FINAL NEGATIVE REPORT Performed at [facility]     Report Status PENDING  Incomplete  Urine culture     Status: None   Collection Time: 07/20/14  1:40 PM  Result Value Ref Range Status   Specimen Description URINE, CLEAN CATCH  Final   Special Requests NONE  Final   Colony Count NO GROWTH Performed at [facility]   Final   Culture NO GROWTH Performed at [facility]   Final   Report Status 07/21/2014 FINAL  Final  Culture, respiratory (NON-Expectorated)     Status: None (Preliminary result)   Collection Time: 07/22/14 12:56 PM  Result Value Ref Range Status   Specimen Description TRACHEAL ASPIRATE  Final   Special Requests NONE  Final   Gram Stain   Final    RARE WBC PRESENT, PREDOMINANTLY MONONUCLEAR RARE SQUAMOUS EPITHELIAL CELLS PRESENT FEW GRAM POSITIVE COCCI IN PAIRS IN CLUSTERS Performed at [facility]    Culture PENDING  Incomplete   Report Status PENDING  Incomplete  Fungus Culture with Smear     Status: None (Preliminary result)   Collection Time: 07/22/14  1:05 PM  Result Value Ref Range Status   Specimen Description LUNG  Final   Special Requests NONE  Final   Fungal Smear   Final    NO YEAST OR FUNGAL ELEMENTS SEEN Performed at [facility]    Culture   Final    CULTURE IN PROGRESS FOR FOUR WEEKS Performed at [facility]    Report Status PENDING  Incomplete  MRSA PCR Screening     Status: None   Collection Time: 07/22/14  1:07 PM  Result Value Ref Range Status   MRSA by PCR NEGATIVE NEGATIVE Final    Comment:        The GeneXpert MRSA Assay (FDA approved for NASAL specimens only), is one component of a comprehensive MRSA colonization surveillance program. It is not intended to diagnose MRSA infection nor to guide or monitor treatment for MRSA infections.   Culture, respiratory (NON-Expectorated)     Status: None (Preliminary result)   Collection Time: 07/22/14  1:25 PM  Result Value Ref Range Status   Specimen Description BRONCHIAL ALVEOLAR LAVAGE  Final    Special Requests Immunocompromised  Final  Gram Stain   Final    FEW WBC PRESENT, PREDOMINANTLY MONONUCLEAR RARE SQUAMOUS EPITHELIAL CELLS PRESENT FEW GRAM POSITIVE COCCI IN PAIRS IN CLUSTERS Performed at [facility]    Culture   Final    FEW STAPHYLOCOCCUS SPECIES Performed at [facility]    Report Status PENDING  Incomplete  Fungus Culture with Smear     Status: None (Preliminary result)   Collection Time: 07/22/14  1:27 PM  Result Value Ref Range Status   Specimen Description BRONCHIAL ALVEOLAR LAVAGE  Final   Special Requests Immunocompromised  Final   Fungal Smear   Final    NO YEAST OR FUNGAL ELEMENTS SEEN Performed at [facility]    Culture   Final    CULTURE IN PROGRESS FOR FOUR WEEKS Performed at [facility]    Report Status PENDING  Incomplete  AFB culture with smear     Status: None (Preliminary result)   Collection Time: 07/22/14  1:27 PM  Result Value Ref Range Status   Specimen Description BRONCHIAL ALVEOLAR LAVAGE  Final   Special Requests Immunocompromised  Final   Acid Fast Smear   Final    NO ACID FAST BACILLI SEEN Performed at [facility]    Culture   Final    CULTURE WILL BE EXAMINED FOR 6 WEEKS BEFORE ISSUING A FINAL REPORT Performed at [facility]    Report Status PENDING  Incomplete     Studies: Dg Chest 2 View (if Patient Has Fever And/or Copd)  07/20/2014   CLINICAL DATA:  Cough and shortness of breath for 1 month.  EXAM: CHEST  2 VIEW  COMPARISON:  Chest x-rays dated 07/10/2014 and 07/08/2014 and 06/25/2014 and chest CT dated 09/24/2013  FINDINGS: The heart size and pulmonary vascularity are normal. There are multiple areas of scarring in both lungs, particularly in the right upper lobe and left lung base. There is a small area of scarring laterally at the left lung base as well as posterior medially. There is a tiny right pleural effusion, unchanged since 06/25/2014 but new since  09/24/2013.  The markings are slightly more accentuated at the bases than on the prior study.  No acute osseous abnormalities. There are emphysematous changes bilaterally.  IMPRESSION: Emphysema with scarring in both lungs. Slight increased accentuation of the interstitial markings could represent superimposed bronchitis. Persistent small right pleural effusion.   Electronically Signed   By: Rozetta Nunnery M.D.   On: 07/20/2014 14:17   Dg Chest 2 View  07/10/2014   CLINICAL DATA:  Cough and congestion .  EXAM: CHEST  2 VIEW  COMPARISON:  07/08/2014.  06/25/2014.  FINDINGS: Mediastinum and hilar structures normal. Stable diffuse bilateral pulmonary interstitial mild prominence noted suggesting chronic interstitial lung disease. Pleural parenchymal thickening consistent with scarring. COPD. Heart size is stable. No pulmonary venous congestion. Scoliosis thoracic spine with diffuse degenerative change.  IMPRESSION: Stable mild interstitial prominence consistent with chronic interstitial lung disease. Pleural parenchymal scarring. COPD. No acute cardiopulmonary disease.   Electronically Signed   By: Marcello Moores  Register   On: 07/10/2014 12:32   Ct Chest W Contrast  07/21/2014   CLINICAL DATA:  81 year old female with history of small vessel vasculitis (granulomatous polyangitis) with history of airway stenosis, presenting with loud wheeze and scant hemoptysis. Evaluate for potential parenchymal involvement of the lungs versus airway stenosis. Shortness of breath, wheezing and fever for 1 month.  EXAM: CT CHEST WITH CONTRAST  TECHNIQUE: Multidetector CT imaging  of the chest was performed during intravenous contrast administration.  CONTRAST:  30mL OMNIPAQUE IOHEXOL 300 MG/ML  SOLN  COMPARISON:  Chest CT 09/24/2013.  FINDINGS: Comment: Examination is limited by considerable patient respiratory motion.  Mediastinum/Lymph Nodes: Heart size is mildly enlarged. There is no significant pericardial fluid, thickening or  pericardial calcification. Calcifications of the mitral annulus. No pathologically enlarged mediastinal or hilar lymph nodes. Esophagus is unremarkable in appearance.  Lungs/Pleura: In the proximal trachea shortly beneath the level of the vocal cords There is some thin irregular-shaped soft tissue which has the appearance of a small membrane, which appears intimately associated with the wall of the trachea. The trachea and mainstem bronchi are otherwise widely patent. Again noted are nodular appearing areas of architectural distortion in the lungs bilaterally, most notably in the left lower lobe where there is a 1.8 x 1.3 cm spiculated appearing nodular density in the superior segment of the left lower lobe, and a 2.4 x 1.5 cm spiculated appearing nodular density in the posterior aspect of the left lower lobe. Multiple other smaller nodules are noted. These two largest lesions appear more prominent than the prior study from 09/24/2013, however, when compared to more remote prior studies such that is 01/03/2011, both of these lesions appear slightly smaller, and are presumably areas of chronic postinflammatory scarring given the patient's history of vasculitis. There is a background of mild diffuse ground-glass attenuation with mild interlobular septal thickening, suggesting a background of mild interstitial pulmonary edema. Moderate bilateral pleural effusions are noted. Large bulla in the apex of the left hemithorax is unchanged.  Musculoskeletal/Soft Tissues: There are no aggressive appearing lytic or blastic lesions noted in the visualized portions of the skeleton.  Upper Abdomen: Unremarkable.  IMPRESSION: 1. There is a thin membranous appearing structure in the proximal trachea immediately beneath the level the vocal cords, which is of uncertain etiology and significance, but may account for the patient's upper airway wheezing on physical examination. Consideration for direct inspection with bronchoscopy is  recommended if clinically appropriate. 2. The appearance of the chest suggests mild congestive heart failure, as above. 3. Multiple nodules throughout the lungs bilaterally (left greater than right), similar in appearance to numerous prior examinations, presumably sequela of underlying Wegner's granulomatosis. No new large cavitary lesion to suggest active parenchymal involvement at this time. 4. Mild cardiomegaly.   Electronically Signed   By: Vinnie Langton M.D.   On: 07/21/2014 13:01   Ct Maxillofacial W/cm  07/22/2014   CLINICAL DATA:  Wegner's granulomatosis. Question fungal sinusitis as cause of airway debris.  EXAM: CT MAXILLOFACIAL WITH CONTRAST  TECHNIQUE: Multidetector CT imaging of the maxillofacial structures was performed with intravenous contrast. Multiplanar CT image reconstructions were also generated. A small metallic BB was placed on the right temple in order to reliably differentiate right from left.  CONTRAST:  138mL OMNIPAQUE IOHEXOL 300 MG/ML  SOLN  COMPARISON:  05/08/2008  FINDINGS: Chronic pansinusitis status post medial maxillary antrostomies, bilateral turbinectomies, and ethmoidectomies. The right sphenoid sinus has also been opened. Mucosal thickening is diffuse throughout the sinuses and there are small layering effusions within the maxillary and right sphenoid sinuses. The surgical openings are patent, although the right sphenoidotomy is moderately narrowed by mucosal disease. There is a small perforation in the anterior lower nasal septum. These changes are all consistent with history of Wegner's granulomatosis. Concerning the clinical question of fungal sinusitis, there certainly could be fungal colonization (some of the sinus material is high-density), but no fungal mass or  invasive sinusitis is detected to explain the obstructive airway mass on bronchoscopy.  Complete opacification of the bilateral mastoid and middle ear cavities. No wrist changes.  Bilateral cataract  resection.  Otherwise, the orbits are negative.  Limited evaluation of the oral cavity, oropharynx, and hypopharynx due to intubation with retained fluid.  IMPRESSION: 1. Wegner's granulomatosis with pansinusitis and otomastoiditis described above. 2. Concerning the clinical question, there may be sinus fungal colonization but there is no visible mycetoma or invasive sinusitis.   Electronically Signed   By: Jorje Guild M.D.   On: 07/22/2014 22:34   Dg Chest Port 1 View  07/24/2014   CLINICAL DATA:  Acute respiratory failure.  Hypoxia.  EXAM: PORTABLE CHEST - 1 VIEW  COMPARISON:  07/23/2014  FINDINGS: Left PICC line tip: Lower SVC. Mild enlargement of the cardiopericardial silhouette diffuse interstitial accentuation. Suspected layering left pleural effusion. Left mid lung hazy opacity mildly improved.  Atherosclerotic calcification of the thoracic aorta. Probable small right pleural effusion. Obscuration of the left hemidiaphragm, probably due to the pleural effusion and passive atelectasis.  IMPRESSION: 1. The dominant changes reduced hazy density in the left mid lung probably due to redistribution of left pleural effusion. There may be some slight improvement in the underlying interstitial accentuation which is probably a manifestation of pulmonary edema, less likely from atypical infectious process.   Electronically Signed   By: Sherryl Barters M.D.   On: 07/24/2014 10:13   Dg Chest Port 1 View  07/23/2014   CLINICAL DATA:  Acute respiratory failure  EXAM: PORTABLE CHEST - 1 VIEW  COMPARISON:  07/22/2014  FINDINGS: Cardiac shadow is stable. An endotracheal tube, left-sided PICC line and nasogastric catheter are stable in appearance. Increasing density is noted in the left retrocardiac region consistent with atelectasis. Diffuse vascular congestion is again identified.  IMPRESSION: Increasing left lower lobe atelectasis. The remainder of the exam is stable.   Electronically Signed   By: Inez Catalina  M.D.   On: 07/23/2014 07:13   Dg Chest Port 1 View  07/22/2014   CLINICAL DATA:  Respiratory failure.  Endotracheal tube placement.  EXAM: PORTABLE CHEST - 1 VIEW  COMPARISON:  07/20/2014  FINDINGS: The endotracheal to is 3.5 cm above the carina. The NG tube is in the stomach. The cardiac silhouette, mediastinal and hilar contours are stable. Moderate pulmonary edema. Small pleural effusions.  IMPRESSION: The endotracheal tube and NG tubes are in good position.  Moderate pulmonary edema.   Electronically Signed   By: Kalman Jewels M.D.   On: 07/22/2014 14:21   Dg Chest Port 1 View  07/21/2014   CLINICAL DATA:  F/u SOB, droplet precautions, hx. Asthma, fever, cough and congestion, chest tightness and wheezing  EXAM: PORTABLE CHEST - 1 VIEW  COMPARISON:  the previous day's study  FINDINGS: Increase in diffuse interstitial infiltrates or edema, with peripheral septal lines and some patchy new airspace opacities in bilateral lung bases. Heart size upper limits normal for technique. No effusion. Mild spondylitic changes in the mid thoracic spine.  IMPRESSION: 1. Progressive bilateral edema/infiltrates as above.   Electronically Signed   By: Arne Cleveland M.D.   On: 07/21/2014 11:02    Scheduled Meds: . antiseptic oral rinse  7 mL Mouth Rinse q12n4p  . chlorhexidine  15 mL Mouth Rinse BID  . insulin aspart  0-9 Units Subcutaneous 6 times per day  . levalbuterol  1.25 mg Nebulization 3 times per day  . methylPREDNISolone (SOLU-MEDROL) injection  40  mg Intravenous Q12H  . mometasone-formoterol  2 puff Inhalation BID  . pantoprazole (PROTONIX) IV  40 mg Intravenous Q12H  . Racepinephrine HCl  0.5 mL Nebulization Once  . sodium chloride  10-40 mL Intracatheter Q12H  . sodium chloride  3 mL Intravenous Q12H  . warfarin  2.5 mg Oral ONCE-1800  . Warfarin - Pharmacist Dosing Inpatient   Does not apply q1800   Continuous Infusions: . sodium chloride 75 mL/hr at 07/24/14 0411  . sodium chloride 10  mL/hr at 07/24/14 0000    Principal Problem:   HCAP (healthcare-associated pneumonia) Active Problems:   Dyspnea   Protein-calorie malnutrition, severe   Acute respiratory failure with hypoxia    Time spent: 40 minutes   [facility] Pager [phone]. If 7PM-7AM, please contact night-coverage at [email or web address], password [email or web address] 07/25/2014, 10:33 AM  LOS: 5 days

## 2014-07-25 NOTE — Progress Notes (Signed)
PULMONARY / CRITICAL CARE MEDICINE   Name: [name] MRN: [id number] DOB: 1932/07/11    ADMISSION DATE:  07/20/2014 CONSULTATION DATE:  07/21/14  REFERRING MD :  Dr. Allyson Sabal   CHIEF COMPLAINT:  Dyspnea, cough   INITIAL PRESENTATION: 81 y/o female patient of [name] with with granulomatous polyangitis and asthma was admitted on 1/19 with dyspnea, fever, wheeze.  STUDIES:  1/19  CT Chest >> thin membranous appearing structure in proximal trachea beneath the vocal cords of uncertain etiology, mild CHF, multiple nodules throughout lungs bilaterally L>R, no new large cavitary lesions  SIGNIFICANT EVENTS: 1/18  Admit with weight loss, fever 103, weakness & persistent cough 1/19  PCCM consulted for evaluation  1/20  Decompensated with stridor, to ICU for emergent intubation > large black fibrinous mass removed from trachea 1/22  Extubated  SUBJECTIVE:   Had an episode of acute dyspnea, stridor and difficulty clearing secretions this am when she woke up. She cleared some mucous, received ativan, improved.   VITAL SIGNS: Temp:  [98 F (36.7 C)-99.7 F (37.6 C)] 98.6 F (37 C) (01/23 0600) Pulse Rate:  [71-109] 80 (01/23 0600) Resp:  [12-25] 18 (01/23 0600) BP: (127-180)/(57-116) 180/80 mmHg (01/23 0600) SpO2:  [92 %-100 %] 100 % (01/23 0748) FiO2 (%):  [28 %-35 %] 35 % (01/23 0748) Weight:  [65.4 kg (144 lb 2.9 oz)] 65.4 kg (144 lb 2.9 oz) (01/23 0500)   HEMODYNAMICS:     VENTILATOR SETTINGS: Vent Mode:  [-]  FiO2 (%):  [28 %-35 %] 35 %   INTAKE / OUTPUT:  Intake/Output Summary (Last 24 hours) at 07/25/14 0928 Last data filed at 07/25/14 0600  Gross per 24 hour  Intake   1700 ml  Output    750 ml  Net    950 ml    PHYSICAL EXAMINATION: General:  Breathing comfortably, able to speak HEENT: NCAT EOMi, very mild stridor PULM: Rhonchi LLL, RUL CV: RRR, no mgr AB: BS+, soft, nontender Ext: warm, no edema Neuro: Awake and alert, pleasant  mood  LABS:  CBC  Recent Labs Lab 07/22/14 0700 07/23/14 0400 07/24/14 0420  WBC 5.2 10.8* 8.8  HGB 7.9* 7.4* 8.1*  HCT 26.5* 24.0* 26.8*  PLT 479* 384 421*   Coag's  Recent Labs Lab 07/23/14 0400 07/24/14 0420 07/25/14 0530  INR 2.53* 2.56* 2.60*   BMET  Recent Labs Lab 07/22/14 0700 07/23/14 0400 07/24/14 0420  NA 138 141 142  K 4.0 4.0 4.4  CL 108 113* 114*  CO2 24 24 23   BUN 12 19 29*  CREATININE 0.66 0.70 0.82  GLUCOSE 178* 140* 159*   Electrolytes  Recent Labs Lab 07/20/14 1713  07/22/14 0700 07/23/14 0400 07/24/14 0420  CALCIUM  --   < > 8.9 8.5 9.0  MG 2.0  --   --   --   --   < > = values in this interval not displayed. Sepsis Markers  Recent Labs Lab 07/20/14 1334  LATICACIDVEN 1.63   ABG  Recent Labs Lab 07/22/14 1414  PHART 7.341*  PCO2ART 41.1  PO2ART 354.0*     Liver Enzymes  Recent Labs Lab 07/21/14 0350 07/22/14 0700 07/23/14 0400  AST 24 23 36  ALT 11 14 23   ALKPHOS 94 97 97  BILITOT 0.4 0.6 0.4  ALBUMIN 2.5* 2.7* 2.3*   Cardiac Enzymes  Recent Labs Lab 07/20/14 1612 07/20/14 1613 07/21/14 0350  TROPONINI 0.20* 0.22* 0.17*   Glucose  Recent Labs Lab  07/24/14 0816 07/24/14 1156 07/24/14 1650 07/24/14 2014 07/24/14 2344 07/25/14 0339  GLUCAP 141* 125* 172* 210* 158* 142*    Imaging Dg Chest Port 1 View  07/24/2014   CLINICAL DATA:  Acute respiratory failure.  Hypoxia.  EXAM: PORTABLE CHEST - 1 VIEW  COMPARISON:  07/23/2014  FINDINGS: Left PICC line tip: Lower SVC. Mild enlargement of the cardiopericardial silhouette diffuse interstitial accentuation. Suspected layering left pleural effusion. Left mid lung hazy opacity mildly improved.  Atherosclerotic calcification of the thoracic aorta. Probable small right pleural effusion. Obscuration of the left hemidiaphragm, probably due to the pleural effusion and passive atelectasis.  IMPRESSION: 1. The dominant changes reduced hazy density in the left mid  lung probably due to redistribution of left pleural effusion. There may be some slight improvement in the underlying interstitial accentuation which is probably a manifestation of pulmonary edema, less likely from atypical infectious process.   Electronically Signed   By: Sherryl Barters M.D.   On: 07/24/2014 10:13     ASSESSMENT / PLAN:  PULMONARY OETT 1/20 >> 1/22 A: Acute Respiratory Failure with Hypoxemia > due to fibrinous mass (fungal?) resolved Stridor> still occuring 1/23 am, seems to be associated with secretion management and also anxiety. ENT eval reassuring Sinusitis > see previous notes' discussion Hx Aspergillosis but galactomannan Ag negative on 1/18 Granulomatosis polyangitis but no clear evidence of active lung involvement 1/21 Asthma, not in exacerbation P:   Continue solumedrol for mild stridor ENT eval > no evidence to support fungal sinus disease.  Continue Xopenex Q8 + Q6 PRN  Restart Dulera  Continue solumedrol for now, plan to wean again 1/23 May need ID consult, f/u special stains on path from tracheal mass sent   CARDIOVASCULAR CVL  A:  Tachycardia - resolved PICC 1/21 P:  Supportive care KVO fluids  RENAL A:   No Acute Issues  P:   Monitor BMET and UOP Replace electrolytes as needed  GASTROINTESTINAL A:   No acute issues P:   PPI  Advance diet regular  HEMATOLOGIC A:   Wegener's Granulomatosis  Normocytic Anemia  Hx PE  Coagulopathy - chronic coumadin  P:  Continue coumadin per pharmacy  Repeat CBC 1/23 and am 1/24  INFECTIOUS A:   Suspect fungal sinusitis  Staph species 1/2 BC > likely contaminant [place] HCAP P:   BCx2  1/18 >> few coag neg staph species 1/2  UC 1/19 >> neg BAL 1/20 >> few GPC in clusters AFB (bronch) 1/20 >>  Fungal (bronch) 1/20 >>  Cytology (fibrinous mass) 1/20 >>  Culture (finbrinous mass) 1/20 >> few GPC in clusters Fungal (fibrinous mass) 1/20 >>  If special stains from path consistent with  fungal infection then will consult ID, no evidence of this yet  Vanco, start date 1/18, day 5/5 (Stopped) Zosyn, start date 1/18, day 5/5 (Stopped)  ENDOCRINE A:   Mild Hyperglycemia  P:   SSI, sensitive scale with steroids  NEUROLOGIC A:   No acute issues P:   Ambulate, out of bed  FAMILY  - Updates: Dr Lake Bells has been updating her son-in law by phone and text messaging as of 1/21, daughter in town 1/22 will update bedside if/when she arrives  Keep her in SDU through the weekend  Baltazar Apo, MD, PhD 07/25/2014, 9:41 AM [facility] [phone] or if no answer [phone]

## 2014-07-25 NOTE — Progress Notes (Signed)
[place] for Warfarin Indication: Hx PE  Allergies  Allergen Reactions  . Adhesive [Tape] Rash    Patient Measurements: Height: 5\' 2"  (157.5 cm) Weight: 144 lb 2.9 oz (65.4 kg) IBW/kg (Calculated) : 50.1  Vital Signs: Temp: 97.7 F (36.5 C) (01/23 0900) Temp Source: Core (Comment) (01/23 0900) BP: 141/78 mmHg (01/23 0900) Pulse Rate: 89 (01/23 0900)  Labs:  Recent Labs  07/23/14 0400 07/24/14 0420 07/25/14 0530  HGB 7.4* 8.1*  --   HCT 24.0* 26.8*  --   PLT 384 421*  --   LABPROT 27.5* 27.7* 28.1*  INR 2.53* 2.56* 2.60*  CREATININE 0.70 0.82  --     Estimated Creatinine Clearance: 49.4 mL/min (by C-G formula based on Cr of 0.82).  Assessment: 50 yoF [facility] with SOB, fever, and worsening productive cough x 1 month. Note PMHx of PE on chronic warfarin anticoagulation.  Pharmacy consulted to resume warfarin inpatient.    Home dose warfarin: 2.5mg  daily except 3.75mg  on M/W/F, LD 1/17  Today, 07/25/2014  Note that warfarin dose 1/20 not given (pt was intubated and NPO, but did have OG tube access.)   INR remains therapeutic  CBC: Hgb improved to 8.1, plts wnl (last checked 1/22)  FOBT negative 1/19   Diet: Regular  Drug interactions: steroids may reduce warfarin needs  Goal of Therapy:  INR 2-3 Monitor platelets by anticoagulation protocol: Yes   Plan:   Warfarin 2.5mg  PO x1 tonight at 1800  Daily PT/INR  Monitor for bleeding  Ralene Bathe, PharmD, BCPS 07/25/2014, 9:50 AM  Pager: [phone]

## 2014-07-26 DIAGNOSIS — A5002 Early congenital syphilitic osteochondropathy: Secondary | ICD-10-CM

## 2014-07-26 DIAGNOSIS — J329 Chronic sinusitis, unspecified: Secondary | ICD-10-CM

## 2014-07-26 DIAGNOSIS — B9562 Methicillin resistant Staphylococcus aureus infection as the cause of diseases classified elsewhere: Secondary | ICD-10-CM

## 2014-07-26 DIAGNOSIS — T17900A Unspecified foreign body in respiratory tract, part unspecified causing asphyxiation, initial encounter: Secondary | ICD-10-CM

## 2014-07-26 DIAGNOSIS — E46 Unspecified protein-calorie malnutrition: Secondary | ICD-10-CM

## 2014-07-26 LAB — GLUCOSE, CAPILLARY
GLUCOSE-CAPILLARY: 137 mg/dL — AB (ref 70–99)
Glucose-Capillary: 128 mg/dL — ABNORMAL HIGH (ref 70–99)

## 2014-07-26 LAB — CBC
HCT: 25.3 % — ABNORMAL LOW (ref 36.0–46.0)
HEMOGLOBIN: 7.7 g/dL — AB (ref 12.0–15.0)
MCH: 24.1 pg — ABNORMAL LOW (ref 26.0–34.0)
MCHC: 30.4 g/dL (ref 30.0–36.0)
MCV: 79.1 fL (ref 78.0–100.0)
Platelets: 383 10*3/uL (ref 150–400)
RBC: 3.2 MIL/uL — ABNORMAL LOW (ref 3.87–5.11)
RDW: 20.3 % — ABNORMAL HIGH (ref 11.5–15.5)
WBC: 6.6 10*3/uL (ref 4.0–10.5)

## 2014-07-26 LAB — COMPREHENSIVE METABOLIC PANEL
ALT: 43 U/L — ABNORMAL HIGH (ref 0–35)
AST: 36 U/L (ref 0–37)
Albumin: 2.3 g/dL — ABNORMAL LOW (ref 3.5–5.2)
Alkaline Phosphatase: 90 U/L (ref 39–117)
Anion gap: 4 — ABNORMAL LOW (ref 5–15)
BUN: 28 mg/dL — ABNORMAL HIGH (ref 6–23)
CO2: 24 mmol/L (ref 19–32)
Calcium: 8.5 mg/dL (ref 8.4–10.5)
Chloride: 112 mmol/L (ref 96–112)
Creatinine, Ser: 0.61 mg/dL (ref 0.50–1.10)
GFR calc Af Amer: >90 mL/min (ref >90)
GFR calc non Af Amer: 84 mL/min — ABNORMAL LOW (ref >90)
Glucose, Bld: 146 mg/dL — ABNORMAL HIGH (ref 70–99)
Potassium: 4.3 mmol/L (ref 3.5–5.1)
Sodium: 140 mmol/L (ref 135–145)
Total Bilirubin: 0.5 mg/dL (ref 0.3–1.2)
Total Protein: 5 g/dL — ABNORMAL LOW (ref 6.0–8.3)

## 2014-07-26 LAB — CULTURE, BLOOD (ROUTINE X 2): Culture: NO GROWTH

## 2014-07-26 LAB — PROTIME-INR
INR: 2.87 — ABNORMAL HIGH (ref 0.00–1.49)
Prothrombin Time: 30.3 seconds — ABNORMAL HIGH (ref 11.6–15.2)

## 2014-07-26 MED ORDER — ZOLPIDEM TARTRATE 5 MG PO TABS
5.0000 mg | ORAL_TABLET | Freq: Every day | ORAL | Status: DC
  Administered 2014-07-26 – 2014-07-29 (×4): 5 mg via ORAL
  Filled 2014-07-26 (×4): qty 1

## 2014-07-26 MED ORDER — LORAZEPAM 0.5 MG PO TABS
0.5000 mg | ORAL_TABLET | ORAL | Status: DC | PRN
Start: 2014-07-26 — End: 2014-07-30

## 2014-07-26 MED ORDER — PREDNISONE 20 MG PO TABS
40.0000 mg | ORAL_TABLET | Freq: Every day | ORAL | Status: DC
  Administered 2014-07-27 – 2014-07-30 (×4): 40 mg via ORAL
  Filled 2014-07-26 (×5): qty 2

## 2014-07-26 MED ORDER — WARFARIN 1.25 MG HALF TABLET
1.2500 mg | ORAL_TABLET | Freq: Once | ORAL | Status: AC
  Administered 2014-07-26: 1.25 mg via ORAL
  Filled 2014-07-26: qty 1

## 2014-07-26 NOTE — Evaluation (Addendum)
Physical Therapy Evaluation Patient Details Name: [name] MRN: [id number] DOB: 1933-06-07 Today's Date: 07/26/2014   History of Present Illness  81 yo female admitted 07/20/14 with worsening SOB/HCAP/MRSA trachea aspirate.required ventilator/extubated 07/24/14.  VDRF, extubated 1/22.Clinical Impression  Patient very motivated to mobilize, limited by drop in sats to 85%. Currently on 8 l. face tent Patient will benefit from PT to address problems listed in note below.    Follow Up Recommendations SNF;Supervision/Assistance - 24 hour    Equipment Recommendations  Rolling walker with 5" wheels    Recommendations for Other Services       Precautions / Restrictions Precautions Precautions: Fall Precaution Comments: requires oxygen Face tent/8 l.     Mobility  Bed Mobility Overal bed mobility: Needs Assistance Bed Mobility: Supine to Sit     Supine to sit: Mod assist;HOB elevated     General bed mobility comments: extra time for breathing /rest,  Transfers Overall transfer level: Needs assistance Equipment used: Rolling walker (2 wheeled) Transfers: Sit to/from [facility] Sit to Stand: Mod assist;From elevated surface Stand pivot transfers: Mod assist;From elevated surface       General transfer comment: lifting assist / steady assist, pivoted frim bed to [facility] with bear hug, then to recliner with RW. stood x 3 min for pericare  Ambulation/Gait                Stairs            Wheelchair Mobility    Modified Rankin (Stroke Patients Only)       Balance Overall balance assessment: Needs assistance         Standing balance support: During functional activity;Bilateral upper extremity supported Standing balance-Leahy Scale: Fair                               Pertinent Vitals/Pain Pain Assessment: No/denies pain    Home Living Family/patient expects to be discharged to:: Private residence [facility]  ) Living Arrangements: Spouse/significant other   Type of Home: Apartment Home Access: Level entry     Home Layout: One level Home Equipment: Cane - single point Additional Comments: spouse unable to assist, family OOT    Prior Function Level of Independence: Independent with assistive device(s)               Hand Dominance        Extremity/Trunk Assessment   Upper Extremity Assessment: Generalized weakness           Lower Extremity Assessment: Generalized weakness      Cervical / Trunk Assessment: Normal  Communication   Communication: HOH  Cognition Arousal/Alertness: Awake/alert Behavior During Therapy: WFL for tasks assessed/performed Overall Cognitive Status: Within Functional Limits for tasks assessed                      General Comments      Exercises        Assessment/Plan    PT Assessment Patient needs continued PT services  PT Diagnosis Difficulty walking;Generalized weakness   PT Problem List Decreased strength;Decreased activity tolerance;Decreased mobility;Decreased balance;Decreased knowledge of precautions;Decreased safety awareness;Decreased knowledge of use of DME;Cardiopulmonary status limiting activity  PT Treatment Interventions DME instruction;Gait training;Functional mobility training;Therapeutic activities;Therapeutic exercise;Patient/family education   PT Goals (Current goals can be found in the Care Plan section) Acute Rehab PT Goals Patient Stated Goal: to walk more PT Goal Formulation: With patient  Time For Goal Achievement: 08/09/14 Potential to Achieve Goals: Good    Frequency Min 3X/week   Barriers to discharge Decreased caregiver support      Co-evaluation               End of Session   Activity Tolerance: Patient tolerated treatment well Patient left: with family/visitor present;in chair;with call bell/phone within reach Nurse Communication: Mobility status         Time: 0821-0858 PT  Time Calculation (min) (ACUTE ONLY): 37 min   Charges:   PT Evaluation $Initial PT Evaluation Tier I: 1 Procedure PT Treatments $Therapeutic Activity: 23-37 mins   PT G Codes:        Claretha Cooper 07/26/2014, 9:12 AM Tresa Endo PT [phone]

## 2014-07-26 NOTE — Clinical Social Work Note (Signed)
  CSW met with pt and pt's daughter at bedside to discuss possible SNF placement  CSW discussed PT recommendation and inquired as to whether pt would be agreeable to SNF  [facility] but is open to a SNF bed at [facility] if needed at discharge  Pt's daughter is hoping pt will be stronger at discharge and not need SNF bed  CSW completed FL2   .Dede Query, LCSW [facility] Clinical Social Worker - Weekend Coverage cell #: [phone]

## 2014-07-26 NOTE — Progress Notes (Signed)
PULMONARY / CRITICAL CARE MEDICINE   Name: [name] MRN: [id number] DOB: 17-Sep-1932    ADMISSION DATE:  07/20/2014 CONSULTATION DATE:  07/21/14  REFERRING MD :  Dr. Allyson Sabal   CHIEF COMPLAINT:  Dyspnea, cough   INITIAL PRESENTATION: 81 y/o female patient of [name] with with granulomatous polyangitis and asthma was admitted on 1/19 with dyspnea, fever, wheeze.  STUDIES:  1/19  CT Chest >> thin membranous appearing structure in proximal trachea beneath the vocal cords of uncertain etiology, mild CHF, multiple nodules throughout lungs bilaterally L>R, no new large cavitary lesions  SIGNIFICANT EVENTS: 1/18  Admit with weight loss, fever 103, weakness & persistent cough 1/19  PCCM consulted for evaluation  1/20  Decompensated with stridor, to ICU for emergent intubation > large black fibrinous mass removed from trachea 1/22  Extubated  SUBJECTIVE:   Better this am, no episodes of acute dyspnea or UA spasm Started nasal saline spray but not dedicated nasal / sinus washes  VITAL SIGNS: Temp:  [98.2 F (36.8 C)-99.3 F (37.4 C)] 98.2 F (36.8 C) (01/24 0817) Pulse Rate:  [64-90] 84 (01/24 0904) Resp:  [15-24] 19 (01/24 0817) BP: (132-169)/(69-148) 152/76 mmHg (01/24 0600) SpO2:  [95 %-100 %] 100 % (01/24 0904) FiO2 (%):  [28 %-38 %] 38 % (01/24 0817)   HEMODYNAMICS:     VENTILATOR SETTINGS: Vent Mode:  [-]  FiO2 (%):  [28 %-38 %] 38 %   INTAKE / OUTPUT:  Intake/Output Summary (Last 24 hours) at 07/26/14 1007 Last data filed at 07/26/14 0600  Gross per 24 hour  Intake   1845 ml  Output    950 ml  Net    895 ml    PHYSICAL EXAMINATION: General:  Breathing comfortably, able to speak, up to chair HEENT: NCAT EOMi, no stridor PULM: Rhonchi LLL, RUL CV: RRR, no mgr AB: BS+, soft, nontender Ext: warm, no edema Neuro: Awake and alert, pleasant mood  LABS:  CBC  Recent Labs Lab 07/24/14 0420 07/25/14 1038 07/26/14 0530  WBC 8.8 8.2 6.6  HGB 8.1* 7.7*  7.7*  HCT 26.8* 25.2* 25.3*  PLT 421* 389 383   Coag's  Recent Labs Lab 07/24/14 0420 07/25/14 0530 07/26/14 0530  INR 2.56* 2.60* 2.87*   BMET  Recent Labs Lab 07/23/14 0400 07/24/14 0420 07/26/14 0530  NA 141 142 140  K 4.0 4.4 4.3  CL 113* 114* 112  CO2 24 23 24   BUN 19 29* 28*  CREATININE 0.70 0.82 0.61  GLUCOSE 140* 159* 146*   Electrolytes  Recent Labs Lab 07/20/14 1713  07/23/14 0400 07/24/14 0420 07/26/14 0530  CALCIUM  --   < > 8.5 9.0 8.5  MG 2.0  --   --   --   --   < > = values in this interval not displayed. Sepsis Markers  Recent Labs Lab 07/20/14 1334  LATICACIDVEN 1.63   ABG  Recent Labs Lab 07/22/14 1414 07/25/14 0854  PHART 7.341* 7.354  PCO2ART 41.1 36.1  PO2ART 354.0* 193.0*     Liver Enzymes  Recent Labs Lab 07/22/14 0700 07/23/14 0400 07/26/14 0530  AST 23 36 36  ALT 14 23 43*  ALKPHOS 97 97 90  BILITOT 0.6 0.4 0.5  ALBUMIN 2.7* 2.3* 2.3*   Cardiac Enzymes  Recent Labs Lab 07/20/14 1612 07/20/14 1613 07/21/14 0350  TROPONINI 0.20* 0.22* 0.17*   Glucose  Recent Labs Lab 07/25/14 1218 07/25/14 1614 07/25/14 1756 07/25/14 2036 07/26/14 0052 07/26/14 2831  GLUCAP 122* 204* 195* 172* 128* 137*    Imaging No results found.   ASSESSMENT / PLAN:  PULMONARY OETT 1/20 >> 1/22 A: Acute Respiratory Failure with Hypoxemia > due to fibrinous mass (fungal?) resolved Stridor> still occuring 1/23 am, seems to be associated with secretion management and also anxiety. ENT eval reassuring Sinusitis > see previous notes' discussion Hx Aspergillosis but galactomannan Ag negative on 1/18 Granulomatosis polyangitis but no clear evidence of active lung involvement 1/21 Asthma, not in exacerbation P:   Wean steroids, stridor improved ENT eval > no evidence to support fungal sinus disease, cx's pending but negative so far Attempt to start [place] bid Continue Xopenex Q8 + Q6 PRN  Restart Dulera  Change solumedrol  to pred 1/24  May need ID consult, f/u special stains on path from tracheal mass sent   CARDIOVASCULAR CVL  A:  Tachycardia - resolved PICC 1/21 P:  Supportive care KVO fluids  RENAL A:   No Acute Issues  P:   Monitor BMET and UOP Replace electrolytes as needed  GASTROINTESTINAL A:   No acute issues P:   PPI  Advance diet regular  HEMATOLOGIC A:   Wegener's Granulomatosis  Normocytic Anemia  Hx PE  Coagulopathy - chronic coumadin  P:  Continue coumadin per pharmacy  Repeat CBC 1/23 and am 1/24  INFECTIOUS A:   Suspect fungal sinusitis  Staph species 1/2 BC > likely contaminant [place] HCAP P:   BCx2  1/18 >> few coag neg staph species 1/2  UC 1/19 >> neg BAL 1/20 >> few GPC in clusters AFB (bronch) 1/20 >>  Fungal (bronch) 1/20 >>  Cytology (fibrinous mass) 1/20 >>  Culture (finbrinous mass) 1/20 >> few GPC in clusters Fungal (fibrinous mass) 1/20 >>  If special stains from path consistent with fungal infection then will consult ID, no evidence of this yet  Vanco, start date 1/18, day 5/5 (Stopped) Zosyn, start date 1/18, day 5/5 (Stopped)  ENDOCRINE A:   Mild Hyperglycemia  P:   SSI, sensitive scale with steroids  NEUROLOGIC A:   No acute issues P:   Ambulate, out of bed  FAMILY  - Updates: Updated her son-in law by phone and text messaging on 1/23, daughter at bedside on 1/24  Agree with transfer to floor bed  Baltazar Apo, MD, PhD 07/26/2014, 10:07 AM [facility] [phone] or if no answer [phone]

## 2014-07-26 NOTE — Progress Notes (Signed)
Left upper extremity venous duplex completed:  No evidence of DVT.  There appears to be superficial thrombus in the left cephalic vein.

## 2014-07-26 NOTE — Progress Notes (Signed)
MD order for nasal wash, more than sprays. Administered by assisting patient to tip her head back, placed a cloth under her nose and lavaged ~ 7-8 heavy sprays into both nares. Able to loosen some crusty drainage which was tinged with old dark blood. Pt exhibited some difficulty blowing nose and rather blew out of her mouth more. She demonstrated and performed oral suction using the [place] well.

## 2014-07-26 NOTE — Progress Notes (Signed)
[facility] PROGRESS NOTE  [name] RSW:[id number] DOB: [birth year] DOA: 07/20/2014 PCP:  [name], MD  Assessment/Plan: Principal Problem:   HCAP (healthcare-associated pneumonia) Active Problems:   Dyspnea   Protein-calorie malnutrition, severe   Acute respiratory failure with hypoxia    Acute Respiratory Failure with Hypoxemia > slow to improve, status post extubation 1/22 Respiratory failure secondary to  fibrinous mass (fungal?) resolved Stridor> still occuring 1/23 am, seems to be associated with secretion management and also anxiety. 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 He recommends to maximize humidity, Face shield with humidified oxygen Continue Xopenex Q8 + Q6 PRN  Continue Dulera , continue raceepinephrine Fungal culture, AFB, negative bronchoalveolar lavage culture shows MRSA Restarted vancomycin 1/23   Sinus tachycardia-improved Continue gentle hydration   GI prophylaxis-switch Protonix to by mouth    Normocytic Anemia ,Hx PE on chronic anticoagulation FOBT negative Continue coumadin per pharmacy  Follow CBC Discussed with the patient's daughter, because of her tenuous respiratory status no inpatient workup is being considered    Suspect fungal sinusitis  Staph species 1/2 BC > likely contaminant Doubt HCAP BCx2 1/18 >> few coag neg staph species 1/2  UC 1/19 >> neg BAL 1/20 >> few GPC in clusters, MRSA AFB (bronch) 1/20 >>  Fungal (bronch) 1/20 >>  Cytology (fibrinous mass) 1/20 >>  Culture (finbrinous mass) 1/20 >> few GPC in  clusters Fungal (fibrinous mass) 1/20 >>  Vanco, start date 1/18, day 5/5 (Stopped) Zosyn, start date 1/18, day 5/5 (Stopped) Vancomycin restarted 1/23 Infectious disease consultation today, discussed with Dr. Johnnye Sima   Steroid-induced hyperglycemia SSI, sensitive scale with steroids   Generalized weakness Start ambulating patient with physical therapy once FiO2 is tapered down to maintain saturation greater than 92% Patient will need SNF Transfer to telemetry Social work consult  Code Status: full Family Communication: family updated about patient's clinical progress Disposition Plan:  Transfer to telemetry   Brief narrative: 81 year old female from SNF, history of Wegener's granulomatosis followed by [name], asthma who presents to the ER because of worsening shortness of breath, fever 103. The patient has not been feeling well for a couple of months gradually worsening over the last 3-4 weeks. The patient states that 2 months ago she was diagnosed with anemia, hemoglobin significantly lower than baseline. She did not have any extensive workup other than been provided with a referral to see [name]. This consult has not taken place as of yet. She denies any hematochezia, melena. She describes 15 pound weight loss in the last 2-3 years. She subsequently fell ill Christmas Eve, so her PCP office and he placed her on levofloxacin for 7 days for suspected pneumonia. Patient was subsequently seen at urgent care on 1/8 for weakness and persistent cough rx sx . In the ED the patient was found to have a normal white count of 5.4. Chest x-ray shows emphysema with increased interstitial markings  Consultants:  Pulmonary critical care  ENT  STUDIES:  1/19 CT Chest >> thin membranous appearing structure in proximal trachea beneath the vocal cords of uncertain etiology, mild CHF, multiple nodules throughout lungs bilaterally L>R, no new large cavitary lesions  SIGNIFICANT  EVENTS: 1/18 Admit with weight loss, fever 103, weakness & persistent cough 1/19 PCCM consulted for evaluation  1/20 Decompensated with stridor, to ICU for emergent intubation > large black fibrinous mass removed from trachea 1/22 Extubated   Antibiotics: Vanco, start date 1/18, day 5/5 (Stopped) Zosyn, start date 1/18, day 5/5 (Stopped) Vancomycin restarted 1/23  HPI/Subjective: Sitting in the chair, as is a face   tent in place, able to speak in full sentences . Shortness of breath at baseline without exacerbation. Complaining of left upper extremity swelling at the site of the PICC line  Objective: Filed Vitals:   07/26/14 0400 07/26/14 0600 07/26/14 0817 07/26/14 0904  BP: 155/74 152/76    Pulse: 74 64 79 84  Temp: 98.2 F (36.8 C) 98.2 F (36.8 C) 98.2 F (36.8 C)   TempSrc:      Resp: 17 16 19    Height:      Weight:      SpO2: 100% 100% 100% 100%    Intake/Output Summary (Last 24 hours) at 07/26/14 1004 Last data filed at 07/26/14 0600  Gross per 24 hour  Intake   1845 ml  Output    950 ml  Net    895 ml    Exam:  General: alert & oriented x 3, weak cough, generally weak Cardiovascular: RRR, nl S1 s2  Respiratory: Decreased breath sounds at the bases, scattered rhonchi, no crackles  Abdomen: soft +BS NT/ND, no masses palpable  Extremities: No cyanosis and no edema      Data Reviewed: Basic Metabolic Panel:  Recent Labs Lab 07/20/14 1713 07/21/14 0350 07/22/14 0700 07/23/14 0400 07/24/14 0420 07/26/14 0530  NA  --  140 138 141 142 140  K  --  3.7 4.0 4.0 4.4 4.3  CL  --  108 108 113* 114* 112  CO2  --  25 24 24 23 24   GLUCOSE  --  105* 178* 140* 159* 146*  BUN  --  6 12 19  29* 28*  CREATININE  --  0.67 0.66 0.70 0.82 0.61  CALCIUM  --  8.8 8.9 8.5 9.0 8.5  MG 2.0  --   --   --   --   --     Liver Function Tests:  Recent Labs Lab 07/20/14 1713 07/21/14 0350 07/22/14 0700 07/23/14 0400 07/26/14 0530  AST 22 24 23  36 36  ALT 12  11 14 23  43*  ALKPHOS 100 94 97 97 90  BILITOT 0.4 0.4 0.6 0.4 0.5  PROT 5.7* 5.4* 5.9* 5.0* 5.0*  ALBUMIN 2.8* 2.5* 2.7* 2.3* 2.3*   No results for input(s): LIPASE, AMYLASE in the last 168 hours. No results for input(s): AMMONIA in the last 168 hours.  CBC:  Recent Labs Lab 07/22/14 0700 07/23/14 0400 07/24/14 0420 07/25/14 1038 07/26/14 0530  WBC 5.2 10.8* 8.8 8.2 6.6  NEUTROABS  --   --  8.1*  --   --   HGB 7.9* 7.4* 8.1* 7.7* 7.7*  HCT 26.5* 24.0* 26.8* 25.2* 25.3*  MCV 78.9 80.3 80.0 79.0 79.1  PLT 479* 384 421* 389 383    Cardiac Enzymes:  Recent Labs Lab 07/20/14 1320 07/20/14 1612 07/20/14 1613 07/21/14 0350  TROPONINI 0.04* 0.20* 0.22* 0.17*   BNP (last 3 results) No results for input(s): PROBNP in the last 8760 hours.   CBG:  Recent Labs Lab 07/25/14 1614 07/25/14 1756 07/25/14 2036 07/26/14 0052 07/26/14 0521  GLUCAP 204* 195* 172* 128* 137*  Recent Results (from the past 240 hour(s))  Culture, blood (routine x 2)     Status: None   Collection Time: 07/20/14  1:20 PM  Result Value Ref Range Status   Specimen Description BLOOD RIGHT ANTECUBITAL  Final   Special Requests BOTTLES DRAWN AEROBIC AND ANAEROBIC 3ML  Final   Culture   Final    STAPHYLOCOCCUS SPECIES (COAGULASE NEGATIVE) Note: THE SIGNIFICANCE OF ISOLATING THIS ORGANISM FROM A SINGLE SET OF BLOOD CULTURES WHEN MULTIPLE SETS ARE DRAWN IS UNCERTAIN. PLEASE NOTIFY THE MICROBIOLOGY DEPARTMENT WITHIN ONE WEEK IF SPECIATION AND SENSITIVITIES ARE REQUIRED. Note: Gram Stain Report Called to,Read Back By and Verified With: JENNIFER HUFF 07/22/14 AT 1150 AM BY [facility] Performed at [facility]    Report Status 07/23/2014 FINAL  Final  Culture, blood (routine x 2)     Status: None (Preliminary result)   Collection Time: 07/20/14  1:22 PM  Result Value Ref Range Status   Specimen Description BLOOD RIGHT FOREARM  Final   Special Requests BOTTLES DRAWN AEROBIC AND ANAEROBIC 3ML  Final    Culture   Final           BLOOD CULTURE RECEIVED NO GROWTH TO DATE CULTURE WILL BE HELD FOR 5 DAYS BEFORE ISSUING A FINAL NEGATIVE REPORT Performed at [facility]    Report Status PENDING  Incomplete  Urine culture     Status: None   Collection Time: 07/20/14  1:40 PM  Result Value Ref Range Status   Specimen Description URINE, CLEAN CATCH  Final   Special Requests NONE  Final   Colony Count NO GROWTH Performed at [facility]   Final   Culture NO GROWTH Performed at [facility]   Final   Report Status 07/21/2014 FINAL  Final  Culture, respiratory (NON-Expectorated)     Status: None   Collection Time: 07/22/14 12:56 PM  Result Value Ref Range Status   Specimen Description TRACHEAL ASPIRATE  Final   Special Requests NONE  Final   Gram Stain   Final    RARE WBC PRESENT, PREDOMINANTLY MONONUCLEAR RARE SQUAMOUS EPITHELIAL CELLS PRESENT FEW GRAM POSITIVE COCCI IN PAIRS IN CLUSTERS Performed at [facility]    Culture   Final    ABUNDANT METHICILLIN RESISTANT STAPHYLOCOCCUS AUREUS Note: RIFAMPIN AND GENTAMICIN SHOULD NOT BE USED AS SINGLE DRUGS FOR TREATMENT OF STAPH INFECTIONS. CRITICAL RESULT CALLED TO, READ BACK BY AND VERIFIED WITH: Clent Ridges RN [id number] 07/25/14 GUS Performed at [facility]    Report Status 07/25/2014 FINAL  Final   Organism ID, Bacteria METHICILLIN RESISTANT STAPHYLOCOCCUS AUREUS  Final      Susceptibility   Methicillin resistant staphylococcus aureus - MIC*    CLINDAMYCIN >=8 RESISTANT Resistant     ERYTHROMYCIN >=8 RESISTANT Resistant     GENTAMICIN <=0.5 SENSITIVE Sensitive     LEVOFLOXACIN >=8 RESISTANT Resistant     OXACILLIN >=4 RESISTANT Resistant     PENICILLIN >=0.5 RESISTANT Resistant     RIFAMPIN <=0.5 SENSITIVE Sensitive     TRIMETH/SULFA >=320 RESISTANT Resistant     VANCOMYCIN 2 SENSITIVE Sensitive     TETRACYCLINE >=16 RESISTANT Resistant     * ABUNDANT METHICILLIN RESISTANT STAPHYLOCOCCUS AUREUS   Fungus Culture with Smear     Status: None (Preliminary result)   Collection Time: 07/22/14  1:05 PM  Result Value Ref Range Status   Specimen Description LUNG  Final   Special Requests NONE  Final   Fungal Smear  Final    NO YEAST OR FUNGAL ELEMENTS SEEN Performed at [facility]    Culture   Final    CULTURE IN PROGRESS FOR FOUR WEEKS Performed at [facility]    Report Status PENDING  Incomplete  MRSA PCR Screening     Status: None   Collection Time: 07/22/14  1:07 PM  Result Value Ref Range Status   MRSA by PCR NEGATIVE NEGATIVE Final    Comment:        The GeneXpert MRSA Assay (FDA approved for NASAL specimens only), is one component of a comprehensive MRSA colonization surveillance program. It is not intended to diagnose MRSA infection nor to guide or monitor treatment for MRSA infections.   Culture, respiratory (NON-Expectorated)     Status: None   Collection Time: 07/22/14  1:25 PM  Result Value Ref Range Status   Specimen Description BRONCHIAL ALVEOLAR LAVAGE  Final   Special Requests Immunocompromised  Final   Gram Stain   Final    FEW WBC PRESENT, PREDOMINANTLY MONONUCLEAR RARE SQUAMOUS EPITHELIAL CELLS PRESENT FEW GRAM POSITIVE COCCI IN PAIRS IN CLUSTERS Performed at [facility]    Culture   Final    FEW METHICILLIN RESISTANT STAPHYLOCOCCUS AUREUS Note: RIFAMPIN AND GENTAMICIN SHOULD NOT BE USED AS SINGLE DRUGS FOR TREATMENT OF STAPH INFECTIONS. CRITICAL RESULT CALLED TO, READ BACK BY AND VERIFIED WITH: Clent Ridges RN [id number] 07/25/14 GUSTK Performed at [facility]    Report Status 07/25/2014 FINAL  Final   Organism ID, Bacteria METHICILLIN RESISTANT STAPHYLOCOCCUS AUREUS  Final      Susceptibility   Methicillin resistant staphylococcus aureus - MIC*    CLINDAMYCIN >=8 RESISTANT Resistant     ERYTHROMYCIN >=8 RESISTANT Resistant     GENTAMICIN <=0.5 SENSITIVE Sensitive     LEVOFLOXACIN >=8 RESISTANT Resistant      OXACILLIN >=4 RESISTANT Resistant     PENICILLIN >=0.5 RESISTANT Resistant     RIFAMPIN <=0.5 SENSITIVE Sensitive     TRIMETH/SULFA >=320 RESISTANT Resistant     VANCOMYCIN 2 SENSITIVE Sensitive     TETRACYCLINE >=16 RESISTANT Resistant     * FEW METHICILLIN RESISTANT STAPHYLOCOCCUS AUREUS  Fungus Culture with Smear     Status: None (Preliminary result)   Collection Time: 07/22/14  1:27 PM  Result Value Ref Range Status   Specimen Description BRONCHIAL ALVEOLAR LAVAGE  Final   Special Requests Immunocompromised  Final   Fungal Smear   Final    NO YEAST OR FUNGAL ELEMENTS SEEN Performed at [facility]    Culture   Final    CULTURE IN PROGRESS FOR FOUR WEEKS Performed at [facility]    Report Status PENDING  Incomplete  AFB culture with smear     Status: None (Preliminary result)   Collection Time: 07/22/14  1:27 PM  Result Value Ref Range Status   Specimen Description BRONCHIAL ALVEOLAR LAVAGE  Final   Special Requests Immunocompromised  Final   Acid Fast Smear   Final    NO ACID FAST BACILLI SEEN Performed at [facility]    Culture   Final    CULTURE WILL BE EXAMINED FOR 6 WEEKS BEFORE ISSUING A FINAL REPORT Performed at [facility]    Report Status PENDING  Incomplete     Studies: Dg Chest 2 View (if Patient Has Fever And/or Copd)  07/20/2014   CLINICAL DATA:  Cough and shortness of breath for 1 month.  EXAM: CHEST  2 VIEW  COMPARISON:  Chest x-rays dated 07/10/2014 and 07/08/2014 and 06/25/2014 and chest CT dated 09/24/2013  FINDINGS: The heart size and pulmonary vascularity are normal. There are multiple areas of scarring in both lungs, particularly in the right upper lobe and left lung base. There is a small area of scarring laterally at the left lung base as well as posterior medially. There is a tiny right pleural effusion, unchanged since 06/25/2014 but new since 09/24/2013.  The markings are slightly more accentuated at the bases than  on the prior study.  No acute osseous abnormalities. There are emphysematous changes bilaterally.  IMPRESSION: Emphysema with scarring in both lungs. Slight increased accentuation of the interstitial markings could represent superimposed bronchitis. Persistent small right pleural effusion.   Electronically Signed   By: Rozetta Nunnery M.D.   On: 07/20/2014 14:17   Dg Chest 2 View  07/10/2014   CLINICAL DATA:  Cough and congestion .  EXAM: CHEST  2 VIEW  COMPARISON:  07/08/2014.  06/25/2014.  FINDINGS: Mediastinum and hilar structures normal. Stable diffuse bilateral pulmonary interstitial mild prominence noted suggesting chronic interstitial lung disease. Pleural parenchymal thickening consistent with scarring. COPD. Heart size is stable. No pulmonary venous congestion. Scoliosis thoracic spine with diffuse degenerative change.  IMPRESSION: Stable mild interstitial prominence consistent with chronic interstitial lung disease. Pleural parenchymal scarring. COPD. No acute cardiopulmonary disease.   Electronically Signed   By: Marcello Moores  Register   On: 07/10/2014 12:32   Ct Chest W Contrast  07/21/2014   CLINICAL DATA:  81 year old female with history of small vessel vasculitis (granulomatous polyangitis) with history of airway stenosis, presenting with loud wheeze and scant hemoptysis. Evaluate for potential parenchymal involvement of the lungs versus airway stenosis. Shortness of breath, wheezing and fever for 1 month.  EXAM: CT CHEST WITH CONTRAST  TECHNIQUE: Multidetector CT imaging of the chest was performed during intravenous contrast administration.  CONTRAST:  50mL OMNIPAQUE IOHEXOL 300 MG/ML  SOLN  COMPARISON:  Chest CT 09/24/2013.  FINDINGS: Comment: Examination is limited by considerable patient respiratory motion.  Mediastinum/Lymph Nodes: Heart size is mildly enlarged. There is no significant pericardial fluid, thickening or pericardial calcification. Calcifications of the mitral annulus. No pathologically  enlarged mediastinal or hilar lymph nodes. Esophagus is unremarkable in appearance.  Lungs/Pleura: In the proximal trachea shortly beneath the level of the vocal cords There is some thin irregular-shaped soft tissue which has the appearance of a small membrane, which appears intimately associated with the wall of the trachea. The trachea and mainstem bronchi are otherwise widely patent. Again noted are nodular appearing areas of architectural distortion in the lungs bilaterally, most notably in the left lower lobe where there is a 1.8 x 1.3 cm spiculated appearing nodular density in the superior segment of the left lower lobe, and a 2.4 x 1.5 cm spiculated appearing nodular density in the posterior aspect of the left lower lobe. Multiple other smaller nodules are noted. These two largest lesions appear more prominent than the prior study from 09/24/2013, however, when compared to more remote prior studies such that is 01/03/2011, both of these lesions appear slightly smaller, and are presumably areas of chronic postinflammatory scarring given the patient's history of vasculitis. There is a background of mild diffuse ground-glass attenuation with mild interlobular septal thickening, suggesting a background of mild interstitial pulmonary edema. Moderate bilateral pleural effusions are noted. Large bulla in the apex of the left hemithorax is unchanged.  Musculoskeletal/Soft Tissues: There are no aggressive appearing lytic or blastic  lesions noted in the visualized portions of the skeleton.  Upper Abdomen: Unremarkable.  IMPRESSION: 1. There is a thin membranous appearing structure in the proximal trachea immediately beneath the level the vocal cords, which is of uncertain etiology and significance, but may account for the patient's upper airway wheezing on physical examination. Consideration for direct inspection with bronchoscopy is recommended if clinically appropriate. 2. The appearance of the chest suggests mild  congestive heart failure, as above. 3. Multiple nodules throughout the lungs bilaterally (left greater than right), similar in appearance to numerous prior examinations, presumably sequela of underlying Wegner's granulomatosis. No new large cavitary lesion to suggest active parenchymal involvement at this time. 4. Mild cardiomegaly.   Electronically Signed   By: Vinnie Langton M.D.   On: 07/21/2014 13:01   Ct Maxillofacial W/cm  07/22/2014   CLINICAL DATA:  Wegner's granulomatosis. Question fungal sinusitis as cause of airway debris.  EXAM: CT MAXILLOFACIAL WITH CONTRAST  TECHNIQUE: Multidetector CT imaging of the maxillofacial structures was performed with intravenous contrast. Multiplanar CT image reconstructions were also generated. A small metallic BB was placed on the right temple in order to reliably differentiate right from left.  CONTRAST:  111mL OMNIPAQUE IOHEXOL 300 MG/ML  SOLN  COMPARISON:  05/08/2008  FINDINGS: Chronic pansinusitis status post medial maxillary antrostomies, bilateral turbinectomies, and ethmoidectomies. The right sphenoid sinus has also been opened. Mucosal thickening is diffuse throughout the sinuses and there are small layering effusions within the maxillary and right sphenoid sinuses. The surgical openings are patent, although the right sphenoidotomy is moderately narrowed by mucosal disease. There is a small perforation in the anterior lower nasal septum. These changes are all consistent with history of Wegner's granulomatosis. Concerning the clinical question of fungal sinusitis, there certainly could be fungal colonization (some of the sinus material is high-density), but no fungal mass or invasive sinusitis is detected to explain the obstructive airway mass on bronchoscopy.  Complete opacification of the bilateral mastoid and middle ear cavities. No wrist changes.  Bilateral cataract resection.  Otherwise, the orbits are negative.  Limited evaluation of the oral cavity,  oropharynx, and hypopharynx due to intubation with retained fluid.  IMPRESSION: 1. Wegner's granulomatosis with pansinusitis and otomastoiditis described above. 2. Concerning the clinical question, there may be sinus fungal colonization but there is no visible mycetoma or invasive sinusitis.   Electronically Signed   By: Jorje Guild M.D.   On: 07/22/2014 22:34   Dg Chest Port 1 View  07/24/2014   CLINICAL DATA:  Acute respiratory failure.  Hypoxia.  EXAM: PORTABLE CHEST - 1 VIEW  COMPARISON:  07/23/2014  FINDINGS: Left PICC line tip: Lower SVC. Mild enlargement of the cardiopericardial silhouette diffuse interstitial accentuation. Suspected layering left pleural effusion. Left mid lung hazy opacity mildly improved.  Atherosclerotic calcification of the thoracic aorta. Probable small right pleural effusion. Obscuration of the left hemidiaphragm, probably due to the pleural effusion and passive atelectasis.  IMPRESSION: 1. The dominant changes reduced hazy density in the left mid lung probably due to redistribution of left pleural effusion. There may be some slight improvement in the underlying interstitial accentuation which is probably a manifestation of pulmonary edema, less likely from atypical infectious process.   Electronically Signed   By: Sherryl Barters M.D.   On: 07/24/2014 10:13   Dg Chest Port 1 View  07/23/2014   CLINICAL DATA:  Acute respiratory failure  EXAM: PORTABLE CHEST - 1 VIEW  COMPARISON:  07/22/2014  FINDINGS: Cardiac shadow is stable. An  endotracheal tube, left-sided PICC line and nasogastric catheter are stable in appearance. Increasing density is noted in the left retrocardiac region consistent with atelectasis. Diffuse vascular congestion is again identified.  IMPRESSION: Increasing left lower lobe atelectasis. The remainder of the exam is stable.   Electronically Signed   By: Inez Catalina M.D.   On: 07/23/2014 07:13   Dg Chest Port 1 View  07/22/2014   CLINICAL DATA:   Respiratory failure.  Endotracheal tube placement.  EXAM: PORTABLE CHEST - 1 VIEW  COMPARISON:  07/20/2014  FINDINGS: The endotracheal to is 3.5 cm above the carina. The NG tube is in the stomach. The cardiac silhouette, mediastinal and hilar contours are stable. Moderate pulmonary edema. Small pleural effusions.  IMPRESSION: The endotracheal tube and NG tubes are in good position.  Moderate pulmonary edema.   Electronically Signed   By: Kalman Jewels M.D.   On: 07/22/2014 14:21   Dg Chest Port 1 View  07/21/2014   CLINICAL DATA:  F/u SOB, droplet precautions, hx. Asthma, fever, cough and congestion, chest tightness and wheezing  EXAM: PORTABLE CHEST - 1 VIEW  COMPARISON:  the previous day's study  FINDINGS: Increase in diffuse interstitial infiltrates or edema, with peripheral septal lines and some patchy new airspace opacities in bilateral lung bases. Heart size upper limits normal for technique. No effusion. Mild spondylitic changes in the mid thoracic spine.  IMPRESSION: 1. Progressive bilateral edema/infiltrates as above.   Electronically Signed   By: Arne Cleveland M.D.   On: 07/21/2014 11:02    Scheduled Meds: . antiseptic oral rinse  7 mL Mouth Rinse q12n4p  . chlorhexidine  15 mL Mouth Rinse BID  . latanoprost  1 drop Both Eyes QHS  . levalbuterol  1.25 mg Nebulization 3 times per day  . methylPREDNISolone (SOLU-MEDROL) injection  40 mg Intravenous Q12H  . mometasone-formoterol  2 puff Inhalation BID  . pantoprazole  40 mg Oral BID  . Racepinephrine HCl  0.5 mL Nebulization Once  . sodium chloride  1 spray Each Nare BID  . sodium chloride  10-40 mL Intracatheter Q12H  . sodium chloride  3 mL Intravenous Q12H  . timolol  1 drop Left Eye Q breakfast  . vancomycin  750 mg Intravenous Q12H  . Warfarin - Pharmacist Dosing Inpatient   Does not apply q1800   Continuous Infusions: . sodium chloride 75 mL/hr at 07/25/14 2313  . sodium chloride 10 mL/hr at 07/24/14 0000    Principal  Problem:   HCAP (healthcare-associated pneumonia) Active Problems:   Dyspnea   Protein-calorie malnutrition, severe   Acute respiratory failure with hypoxia    Time spent: 40 minutes   [facility] Pager [phone]. If 7PM-7AM, please contact night-coverage at [email or web address], password [facility] 07/26/2014, 10:04 AM  LOS: 6 days

## 2014-07-26 NOTE — Consult Note (Signed)
[facility]ious [facility]  Date of Admission:  07/20/2014  Date of Consult:  07/26/2014  Reason for Consult: Wegner's, Sinusitis Referring Physician: Allyson Sabal  Impression/Recommendation Wegner's  Sinusitis Mucous plugging MRSA in BAL Protein-albumin malnutrition    Would- Continue vancomycin Defer adding gram negative coverage of her sinuses at this point. He ENT eval does not mention purulence.   Comment- It is not clear to me if she has actual infection or if this is colonization of her airways and mucous plug. She has improved with removal of this as well as been afebrile in the hospital.  Her CXR does not mention changes that would be consistent with MRSA pneumonia (no cavity, hazy density on L mid lung probable redistribution of effusion).  Dr Megan Salon will f/u tomorrow.   Thank you so much for this interesting consult,   Bobby Rumpf (pager) [phone] [email or web address]  [name] is an 81 y.o. female.  HPI: 81 yo F with hx of Wegner's granulomatosis (not on steroids at home, off methotrexate for last 4 months), previous Apergilosis and M kansasii treated at [facility] ~ 3 years ago,  admitted on 1-18 for SOB and temp 103 worsening over 3 weeks. During this period she had been seen by PCP 1-24 and given levaquin, then seen in urgent care (1-8) and was felt to have a viral syndrome.   On 1-18 she was found to have normal WBC, CXR showing emphysema and possible bronchitis. She was started on vanco/zosyn.  By 1-20 she had worsening respiratory status and required intubation. A large "fibrinous mass" was removed at that time. This was felt to possibly be a fungal mass that had come from her fungal sinusitis.  She has a hx of aspergillosis (recent galactomanin Ag -).  The path from this is pending, the fungal stains are (-). Cx grew MRSA.  She had CT scan of sinuses 1-19 showing:1. Wegner's granulomatosis with pansinusitis and otomastoiditis described above. 2.  Concerning the clinical question, there may be sinus fungal colonization but there is no visible mycetoma or invasive sinusitis. chest CT 1-21 showing: 1. There is a thin membranous appearing structure in the proximal trachea immediately beneath the level the vocal cords, which is of uncertain etiology and significance, but may account for the patient's upper airway wheezing on physical examination. Consideration for direct inspection with bronchoscopy is recommended if clinically appropriate. 2. The appearance of the chest suggests mild congestive heart failure, as above. 3. Multiple nodules throughout the lungs bilaterally (left greater than right), similar in appearance to numerous prior examinations, presumably sequela of underlying Wegner's granulomatosis. No new large cavitary lesion to suggest active parenchymal involvement at this time. 4. Mild cardiomegaly.  Due to stridor she has been started on solumedrol in hospital. She was able to be extubated on 1-22.  She was eval by ENT on 1-22. They did not feel like there was evidence of fungal infection on visual inspection. We are now asked to see for MRSA from a bronchoalveolar lavage. This also grew from a respiratory Cx 1-20. Her fungal Cx have been negative so far.   Vancomycin restarted 1-23.   Past Medical History  Diagnosis Date  . Wegener's granulomatosis   . OA (osteoarthritis)   . Blindness of one eye   . Hearing loss in left ear     hearing aid both ears  . Bladder incontinence   . Glaucoma   . Clotting disorder   . History of pulmonary embolism 1997  .  Shortness of breath dyspnea   . Pneumonia 12/15  . Multiple allergies   . Chronic sinusitis     Past Surgical History  Procedure Laterality Date  . Tubal ligation    . Pubovaginal sling    . Cataract extraction    . Melanoma excision      left leg  . Vena cava filter placement  1997  . Knee arthroscopy  2006    rt   . Inguinal hernia repair Left 02/17/2014     Procedure: LEFT INGUINAL HERNIA REPAIR WITH MESH;  Surgeon: Harl Bowie, MD;  Location: [place];  Service: General;  Laterality: Left;  . Insertion of mesh N/A 02/17/2014    Procedure: INSERTION OF MESH;  Surgeon: Harl Bowie, MD;  Location: [place];  Service: General;  Laterality: N/A;     Allergies  Allergen Reactions  . Adhesive [Tape] Rash    Medications:  Scheduled: . antiseptic oral rinse  7 mL Mouth Rinse q12n4p  . chlorhexidine  15 mL Mouth Rinse BID  . latanoprost  1 drop Both Eyes QHS  . levalbuterol  1.25 mg Nebulization 3 times per day  . mometasone-formoterol  2 puff Inhalation BID  . pantoprazole  40 mg Oral BID  . [START ON 07/27/2014] predniSONE  40 mg Oral Q breakfast  . Racepinephrine HCl  0.5 mL Nebulization Once  . sodium chloride  1 spray Each Nare BID  . sodium chloride  10-40 mL Intracatheter Q12H  . sodium chloride  3 mL Intravenous Q12H  . timolol  1 drop Left Eye Q breakfast  . vancomycin  750 mg Intravenous Q12H  . warfarin  1.25 mg Oral ONCE-1800  . Warfarin - Pharmacist Dosing Inpatient   Does not apply q1800  . zolpidem  5 mg Oral QHS    Abtx:  Anti-infectives    Start     Dose/Rate Route Frequency Ordered Stop   07/25/14 1830  vancomycin (VANCOCIN) IVPB 750 mg/150 ml premix     750 mg150 mL/hr over 60 Minutes Intravenous Every 12 hours 07/25/14 1818     07/23/14 1800  vancomycin (VANCOCIN) IVPB 750 mg/150 ml premix  Status:  Discontinued     750 mg150 mL/hr over 60 Minutes Intravenous Every 12 hours 07/23/14 0604 07/24/14 0830   07/21/14 0600  vancomycin (VANCOCIN) 500 mg in sodium chloride 0.9 % 100 mL IVPB  Status:  Discontinued     500 mg100 mL/hr over 60 Minutes Intravenous Every 12 hours 07/20/14 1622 07/23/14 0604   07/21/14 0000  piperacillin-tazobactam (ZOSYN) IVPB 3.375 g  Status:  Discontinued     3.375 g12.5 mL/hr over 240 Minutes Intravenous Every 8 hours 07/20/14 1623 07/24/14 0830     07/20/14 1445  vancomycin (VANCOCIN) IVPB 1000 mg/200 mL premix     1,000 mg200 mL/hr over 60 Minutes Intravenous  Once 07/20/14 1433 07/20/14 1600   07/20/14 1445  piperacillin-tazobactam (ZOSYN) IVPB 3.375 g     3.375 g12.5 mL/hr over 240 Minutes Intravenous  Once 07/20/14 1433 07/20/14 2015      Total days of antibiotics 7 1-18 vanco/zosyn 1-22 1-23 vanco          Social History:  reports that she quit smoking about 30 years ago. Her smoking use included Cigarettes. She has a 45 pack-year smoking history. She does not have any smokeless tobacco history on file. She reports that she drinks alcohol. She reports that she does not use illicit drugs.  Family History  Problem Relation Age of Onset  . Heart disease Father     stroke-deceased  . Stroke Father   . Congestive Heart Failure Mother     deceased  . Heart disease Mother   . Uterine cancer Sister   . Lymphoma Sister   . Skin cancer Sister     no melanoma    General ROS: she denies headaches. states she does have sinus problems. still has cough, producitve. see HPI.    Blood pressure 169/85, pulse 73, temperature 99.5 F (37.5 C), temperature source Core (Comment), resp. rate 21, height $RemoveBe'5\' 2"'BaYcPOcHE$  (1.575 m), weight 65.4 kg (144 lb 2.9 oz), SpO2 100 %. General appearance: alert, appears stated age and no distress Head: sinuses nontender to percussion Eyes: negative findings: pupils equal, round, reactive to light and accomodation Throat: normal findings: oropharynx pink & moist without lesions or evidence of thrush Neck: no adenopathy and supple, symmetrical, trachea midline Lungs: rhonchi bilaterally and wheezes bilaterally Heart: regular rate and rhythm Abdomen: normal findings: bowel sounds normal and soft, non-tender Extremities: edema none   Results for orders placed or performed during the hospital encounter of 07/20/14 (from the past 48 hour(s))  Glucose, capillary     Status: Abnormal   Collection Time: 07/24/14   4:50 PM  Result Value Ref Range   Glucose-Capillary 172 (H) 70 - 99 mg/dL   Comment 1 Documented in Chart    Comment 2 Notify RN   Glucose, capillary     Status: Abnormal   Collection Time: 07/24/14  8:14 PM  Result Value Ref Range   Glucose-Capillary 210 (H) 70 - 99 mg/dL  Glucose, capillary     Status: Abnormal   Collection Time: 07/24/14 11:44 PM  Result Value Ref Range   Glucose-Capillary 158 (H) 70 - 99 mg/dL  Glucose, capillary     Status: Abnormal   Collection Time: 07/25/14  3:39 AM  Result Value Ref Range   Glucose-Capillary 142 (H) 70 - 99 mg/dL   Comment 1 Notify RN   Protime-INR     Status: Abnormal   Collection Time: 07/25/14  5:30 AM  Result Value Ref Range   Prothrombin Time 28.1 (H) 11.6 - 15.2 seconds   INR 2.60 (H) 0.00 - 1.49  Glucose, capillary     Status: Abnormal   Collection Time: 07/25/14  7:54 AM  Result Value Ref Range   Glucose-Capillary 177 (H) 70 - 99 mg/dL  Blood gas, arterial     Status: Abnormal   Collection Time: 07/25/14  8:54 AM  Result Value Ref Range   FIO2 0.60 %   Delivery systems AEROSOL MASK    pH, Arterial 7.354 7.350 - 7.450   pCO2 arterial 36.1 35.0 - 45.0 mmHg   pO2, Arterial 193.0 (H) 80.0 - 100.0 mmHg   Bicarbonate 19.6 (L) 20.0 - 24.0 mEq/L   TCO2 18.6 0 - 100 mmol/L   Acid-base deficit 4.9 (H) 0.0 - 2.0 mmol/L   O2 Saturation 99.6 %   Patient temperature 37.0    Collection site RIGHT RADIAL    Drawn by [id number]    Sample type ARTERIAL    Allens test (pass/fail) PASS PASS  CBC     Status: Abnormal   Collection Time: 07/25/14 10:38 AM  Result Value Ref Range   WBC 8.2 4.0 - 10.5 K/uL   RBC 3.19 (L) 3.87 - 5.11 MIL/uL   Hemoglobin 7.7 (L) 12.0 - 15.0 g/dL   HCT 25.2 (L) 36.0 -  46.0 %   MCV 79.0 78.0 - 100.0 fL   MCH 24.1 (L) 26.0 - 34.0 pg   MCHC 30.6 30.0 - 36.0 g/dL   RDW 20.0 (H) 11.5 - 15.5 %   Platelets 389 150 - 400 K/uL  Glucose, capillary     Status: Abnormal   Collection Time: 07/25/14 12:18 PM  Result  Value Ref Range   Glucose-Capillary 122 (H) 70 - 99 mg/dL  Glucose, capillary     Status: Abnormal   Collection Time: 07/25/14  4:14 PM  Result Value Ref Range   Glucose-Capillary 204 (H) 70 - 99 mg/dL   Comment 1 Documented in Chart    Comment 2 Notify RN   Glucose, capillary     Status: Abnormal   Collection Time: 07/25/14  5:56 PM  Result Value Ref Range   Glucose-Capillary 195 (H) 70 - 99 mg/dL  Glucose, capillary     Status: Abnormal   Collection Time: 07/25/14  8:36 PM  Result Value Ref Range   Glucose-Capillary 172 (H) 70 - 99 mg/dL  Glucose, capillary     Status: Abnormal   Collection Time: 07/26/14 12:52 AM  Result Value Ref Range   Glucose-Capillary 128 (H) 70 - 99 mg/dL  Glucose, capillary     Status: Abnormal   Collection Time: 07/26/14  5:21 AM  Result Value Ref Range   Glucose-Capillary 137 (H) 70 - 99 mg/dL  Protime-INR     Status: Abnormal   Collection Time: 07/26/14  5:30 AM  Result Value Ref Range   Prothrombin Time 30.3 (H) 11.6 - 15.2 seconds   INR 2.87 (H) 0.00 - 1.49  CBC     Status: Abnormal   Collection Time: 07/26/14  5:30 AM  Result Value Ref Range   WBC 6.6 4.0 - 10.5 K/uL   RBC 3.20 (L) 3.87 - 5.11 MIL/uL   Hemoglobin 7.7 (L) 12.0 - 15.0 g/dL   HCT 25.3 (L) 36.0 - 46.0 %   MCV 79.1 78.0 - 100.0 fL   MCH 24.1 (L) 26.0 - 34.0 pg   MCHC 30.4 30.0 - 36.0 g/dL   RDW 20.3 (H) 11.5 - 15.5 %   Platelets 383 150 - 400 K/uL  Comprehensive metabolic panel     Status: Abnormal   Collection Time: 07/26/14  5:30 AM  Result Value Ref Range   Sodium 140 135 - 145 mmol/L   Potassium 4.3 3.5 - 5.1 mmol/L   Chloride 112 96 - 112 mmol/L   CO2 24 19 - 32 mmol/L   Glucose, Bld 146 (H) 70 - 99 mg/dL   BUN 28 (H) 6 - 23 mg/dL   Creatinine, Ser 0.61 0.50 - 1.10 mg/dL   Calcium 8.5 8.4 - 10.5 mg/dL   Total Protein 5.0 (L) 6.0 - 8.3 g/dL   Albumin 2.3 (L) 3.5 - 5.2 g/dL   AST 36 0 - 37 U/L   ALT 43 (H) 0 - 35 U/L   Alkaline Phosphatase 90 39 - 117 U/L   Total  Bilirubin 0.5 0.3 - 1.2 mg/dL   GFR calc non Af Amer 84 (L) >90 mL/min   GFR calc Af Amer >90 >90 mL/min    Comment: (NOTE) The eGFR has been calculated using the CKD EPI equation. This calculation has not been validated in all clinical situations. eGFR's persistently <90 mL/min signify possible Chronic Kidney Disease.    Anion gap 4 (L) 5 - 15      Component Value Date/Time  SDES BRONCHIAL ALVEOLAR LAVAGE 07/22/2014 [place] 07/22/2014 1327   SPECREQUEST Immunocompromised 07/22/2014 1327   SPECREQUEST Immunocompromised 07/22/2014 1327   CULT  07/22/2014 1327    CULTURE IN PROGRESS FOR FOUR WEEKS Performed at [place]  07/22/2014 1327    CULTURE WILL BE EXAMINED FOR 6 WEEKS BEFORE ISSUING A FINAL REPORT Performed at [place] PENDING 07/22/2014 1327   REPTSTATUS PENDING 07/22/2014 1327   No results found. Recent Results (from the past 240 hour(s))  Culture, blood (routine x 2)     Status: None   Collection Time: 07/20/14  1:20 PM  Result Value Ref Range Status   Specimen Description BLOOD RIGHT ANTECUBITAL  Final   Special Requests BOTTLES DRAWN AEROBIC AND ANAEROBIC 3ML  Final   Culture   Final    STAPHYLOCOCCUS SPECIES (COAGULASE NEGATIVE) Note: THE SIGNIFICANCE OF ISOLATING THIS ORGANISM FROM A SINGLE SET OF BLOOD CULTURES WHEN MULTIPLE SETS ARE DRAWN IS UNCERTAIN. PLEASE NOTIFY THE MICROBIOLOGY DEPARTMENT WITHIN ONE WEEK IF SPECIATION AND SENSITIVITIES ARE REQUIRED. Note: Gram Stain Report Called to,Read Back By and Verified With: JENNIFER HUFF 07/22/14 AT 1150 AM BY [facility] Performed at [facility]    Report Status 07/23/2014 FINAL  Final  Culture, blood (routine x 2)     Status: None   Collection Time: 07/20/14  1:22 PM  Result Value Ref Range Status   Specimen Description BLOOD RIGHT FOREARM  Final   Special Requests BOTTLES DRAWN AEROBIC AND ANAEROBIC 3ML  Final   Culture   Final      NO GROWTH 5 DAYS Performed at [facility]    Report Status 07/26/2014 FINAL  Final  Urine culture     Status: None   Collection Time: 07/20/14  1:40 PM  Result Value Ref Range Status   Specimen Description URINE, CLEAN CATCH  Final   Special Requests NONE  Final   Colony Count NO GROWTH Performed at [facility]   Final   Culture NO GROWTH Performed at [facility]   Final   Report Status 07/21/2014 FINAL  Final  Culture, respiratory (NON-Expectorated)     Status: None   Collection Time: 07/22/14 12:56 PM  Result Value Ref Range Status   Specimen Description TRACHEAL ASPIRATE  Final   Special Requests NONE  Final   Gram Stain   Final    RARE WBC PRESENT, PREDOMINANTLY MONONUCLEAR RARE SQUAMOUS EPITHELIAL CELLS PRESENT FEW GRAM POSITIVE COCCI IN PAIRS IN CLUSTERS Performed at [facility]    Culture   Final    ABUNDANT METHICILLIN RESISTANT STAPHYLOCOCCUS AUREUS Note: RIFAMPIN AND GENTAMICIN SHOULD NOT BE USED AS SINGLE DRUGS FOR TREATMENT OF STAPH INFECTIONS. CRITICAL RESULT CALLED TO, READ BACK BY AND VERIFIED WITH: Clent Ridges RN [id number] 07/25/14 GUS Performed at [facility]    Report Status 07/25/2014 FINAL  Final   Organism ID, Bacteria METHICILLIN RESISTANT STAPHYLOCOCCUS AUREUS  Final      Susceptibility   Methicillin resistant staphylococcus aureus - MIC*    CLINDAMYCIN >=8 RESISTANT Resistant     ERYTHROMYCIN >=8 RESISTANT Resistant     GENTAMICIN <=0.5 SENSITIVE Sensitive     LEVOFLOXACIN >=8 RESISTANT Resistant     OXACILLIN >=4 RESISTANT Resistant     PENICILLIN >=0.5 RESISTANT Resistant     RIFAMPIN <=0.5 SENSITIVE Sensitive     TRIMETH/SULFA >=320 RESISTANT Resistant  VANCOMYCIN 2 SENSITIVE Sensitive     TETRACYCLINE >=16 RESISTANT Resistant     * ABUNDANT METHICILLIN RESISTANT STAPHYLOCOCCUS AUREUS  Fungus Culture with Smear     Status: None (Preliminary result)   Collection Time: 07/22/14  1:05 PM  Result Value  Ref Range Status   Specimen Description LUNG  Final   Special Requests NONE  Final   Fungal Smear   Final    NO YEAST OR FUNGAL ELEMENTS SEEN Performed at [facility]    Culture   Final    CULTURE IN PROGRESS FOR FOUR WEEKS Performed at [facility]    Report Status PENDING  Incomplete  MRSA PCR Screening     Status: None   Collection Time: 07/22/14  1:07 PM  Result Value Ref Range Status   MRSA by PCR NEGATIVE NEGATIVE Final    Comment:        The GeneXpert MRSA Assay (FDA approved for NASAL specimens only), is one component of a comprehensive MRSA colonization surveillance program. It is not intended to diagnose MRSA infection nor to guide or monitor treatment for MRSA infections.   Culture, respiratory (NON-Expectorated)     Status: None   Collection Time: 07/22/14  1:25 PM  Result Value Ref Range Status   Specimen Description BRONCHIAL ALVEOLAR LAVAGE  Final   Special Requests Immunocompromised  Final   Gram Stain   Final    FEW WBC PRESENT, PREDOMINANTLY MONONUCLEAR RARE SQUAMOUS EPITHELIAL CELLS PRESENT FEW GRAM POSITIVE COCCI IN PAIRS IN CLUSTERS Performed at [facility]    Culture   Final    FEW METHICILLIN RESISTANT STAPHYLOCOCCUS AUREUS Note: RIFAMPIN AND GENTAMICIN SHOULD NOT BE USED AS SINGLE DRUGS FOR TREATMENT OF STAPH INFECTIONS. CRITICAL RESULT CALLED TO, READ BACK BY AND VERIFIED WITH: Clent Ridges RN [id number] 07/25/14 [facility] Performed at [facility]    Report Status 07/25/2014 FINAL  Final   Organism ID, Bacteria METHICILLIN RESISTANT STAPHYLOCOCCUS AUREUS  Final      Susceptibility   Methicillin resistant staphylococcus aureus - MIC*    CLINDAMYCIN >=8 RESISTANT Resistant     ERYTHROMYCIN >=8 RESISTANT Resistant     GENTAMICIN <=0.5 SENSITIVE Sensitive     LEVOFLOXACIN >=8 RESISTANT Resistant     OXACILLIN >=4 RESISTANT Resistant     PENICILLIN >=0.5 RESISTANT Resistant     RIFAMPIN <=0.5 SENSITIVE Sensitive      TRIMETH/SULFA >=320 RESISTANT Resistant     VANCOMYCIN 2 SENSITIVE Sensitive     TETRACYCLINE >=16 RESISTANT Resistant     * FEW METHICILLIN RESISTANT STAPHYLOCOCCUS AUREUS  Fungus Culture with Smear     Status: None (Preliminary result)   Collection Time: 07/22/14  1:27 PM  Result Value Ref Range Status   Specimen Description BRONCHIAL ALVEOLAR LAVAGE  Final   Special Requests Immunocompromised  Final   Fungal Smear   Final    NO YEAST OR FUNGAL ELEMENTS SEEN Performed at [facility]    Culture   Final    CULTURE IN PROGRESS FOR FOUR WEEKS Performed at [facility]    Report Status PENDING  Incomplete  AFB culture with smear     Status: None (Preliminary result)   Collection Time: 07/22/14  1:27 PM  Result Value Ref Range Status   Specimen Description BRONCHIAL ALVEOLAR LAVAGE  Final   Special Requests Immunocompromised  Final   Acid Fast Smear   Final    NO ACID FAST BACILLI SEEN Performed at [facility]    Culture   Final    CULTURE WILL BE EXAMINED FOR 6 WEEKS BEFORE ISSUING A FINAL REPORT Performed at [facility]    Report Status PENDING  Incomplete      07/26/2014, 1:08 PM     LOS: 6 days

## 2014-07-26 NOTE — Progress Notes (Signed)
[place] for Warfarin Indication: Hx PE  Allergies  Allergen Reactions  . Adhesive [Tape] Rash    Patient Measurements: Height: 5\' 2"  (157.5 cm) Weight: 144 lb 2.9 oz (65.4 kg) IBW/kg (Calculated) : 50.1  Vital Signs: Temp: 98.2 F (36.8 C) (01/24 0817) BP: 152/76 mmHg (01/24 0600) Pulse Rate: 84 (01/24 0904)  Labs:  Recent Labs  07/24/14 0420 07/25/14 0530 07/25/14 1038 07/26/14 0530  HGB 8.1*  --  7.7* 7.7*  HCT 26.8*  --  25.2* 25.3*  PLT 421*  --  389 383  LABPROT 27.7* 28.1*  --  30.3*  INR 2.56* 2.60*  --  2.87*  CREATININE 0.82  --   --  0.61    Estimated Creatinine Clearance: 50.6 mL/min (by C-G formula based on Cr of 0.61).  Assessment: 59 yoF presents to [facility] with SOB, fever, and worsening productive cough x 1 month. Note PMHx of PE on chronic warfarin anticoagulation.  Pharmacy consulted to resume warfarin inpatient.    Home dose warfarin: 2.5mg  daily except 3.75mg  on M/W/F, LD 1/17  Today, 07/26/2014  Note that warfarin dose 1/20 not given (pt was intubated and NPO, but did have OG tube access.)   INR remains therapeutic, trending up  CBC: Hgb still low, down to 7.7, pltc WNL  FOBT negative 1/19   Diet: Regular  Drug interactions: steroids may reduce warfarin needs  Goal of Therapy:  INR 2-3 Monitor platelets by anticoagulation protocol: Yes   Plan:   Will give lower warfarin dose tonight as INR trending up, nearing upper end of therapeutic range  Warfarin 1.25mg  PO x1 tonight at 1800  Daily PT/INR  CBC in AM   Monitor for bleeding  Ralene Bathe, PharmD, BCPS 07/26/2014, 10:39 AM  Pager: [phone]

## 2014-07-27 DIAGNOSIS — J988 Other specified respiratory disorders: Secondary | ICD-10-CM

## 2014-07-27 LAB — CBC
HCT: 25.9 % — ABNORMAL LOW (ref 36.0–46.0)
HEMOGLOBIN: 7.9 g/dL — AB (ref 12.0–15.0)
MCH: 23.9 pg — AB (ref 26.0–34.0)
MCHC: 30.5 g/dL (ref 30.0–36.0)
MCV: 78.2 fL (ref 78.0–100.0)
PLATELETS: 351 10*3/uL (ref 150–400)
RBC: 3.31 MIL/uL — ABNORMAL LOW (ref 3.87–5.11)
RDW: 19.9 % — ABNORMAL HIGH (ref 11.5–15.5)
WBC: 10.1 10*3/uL (ref 4.0–10.5)

## 2014-07-27 LAB — GLUCOSE, CAPILLARY
Glucose-Capillary: 145 mg/dL — ABNORMAL HIGH (ref 70–99)
Glucose-Capillary: 144 mg/dL — ABNORMAL HIGH (ref 70–99)

## 2014-07-27 LAB — PROTIME-INR
INR: 2.75 — AB (ref 0.00–1.49)
Prothrombin Time: 29.3 seconds — ABNORMAL HIGH (ref 11.6–15.2)

## 2014-07-27 LAB — CREATININE, SERUM
Creatinine, Ser: 0.68 mg/dL (ref 0.50–1.10)
GFR calc Af Amer: >90 mL/min (ref >90)
GFR, EST NON AFRICAN AMERICAN: 81 mL/min — AB (ref >90)

## 2014-07-27 MED ORDER — WARFARIN SODIUM 2 MG PO TABS
2.0000 mg | ORAL_TABLET | Freq: Once | ORAL | Status: AC
Start: 2014-07-27 — End: 2014-07-27
  Administered 2014-07-27: 2 mg via ORAL
  Filled 2014-07-27 (×2): qty 1

## 2014-07-27 MED ORDER — VITAMINS A & D EX OINT
TOPICAL_OINTMENT | CUTANEOUS | Status: DC | PRN
  Filled 2014-07-27: qty 5

## 2014-07-27 NOTE — Progress Notes (Signed)
[place] for Warfarin Indication: Hx PE  Allergies  Allergen Reactions  . Adhesive [Tape] Rash    Patient Measurements: Height: 5\' 2"  (157.5 cm) Weight: 144 lb 2.9 oz (65.4 kg) IBW/kg (Calculated) : 50.1  Vital Signs: Temp: 96.6 F (35.9 C) (01/25 0445) Temp Source: Axillary (01/25 0445) BP: 148/77 mmHg (01/25 0445) Pulse Rate: 73 (01/25 0445)  Labs:  Recent Labs  07/25/14 0530  07/25/14 1038 07/26/14 0530 07/27/14 0530  HGB  --   < > 7.7* 7.7* 7.9*  HCT  --   --  25.2* 25.3* 25.9*  PLT  --   --  389 383 351  LABPROT 28.1*  --   --  30.3* 29.3*  INR 2.60*  --   --  2.87* 2.75*  CREATININE  --   --   --  0.61 0.68  < > = values in this interval not displayed.  Estimated Creatinine Clearance: 50.6 mL/min (by C-G formula based on Cr of 0.68).  Assessment: 66 [facility]F [facility] with [facility], fever, and worsening productive cough x 1 month. Note PMHx of PE on chronic warfarin anticoagulation.  Pharmacy consulted to resume warfarin inpatient.   Home dose warfarin: 2.5mg  daily except 3.75mg  on M/W/F, LD 1/17  Significant events 1/20 warfarin dose not given (pt was intubated and NPO, but did have OG tube access.)  1/24 LUE venous duplex w/ superficial thrombus in left cephalic vein.  Negative for DVT.  Today, 07/27/2014  INR 2.75, remains therapeutic  CBC: Hgb still low, but stable near 7.9, Plt remain WNL.    FOBT negative 1/19   No bleeding or complications reported  Diet: Regular  Drug interactions: abx/steroids may reduce warfarin needs  Goal of Therapy:  INR 2-3 Monitor platelets by anticoagulation protocol: Yes   Plan:   Warfarin 2mg  PO x1 tonight at 1800  Daily PT/INR  CBC in AM   Monitor for bleeding   Gretta Arab PharmD, BCPS Pager [phone] 07/27/2014 11:44 AM

## 2014-07-27 NOTE — Progress Notes (Signed)
Patient ID: [name], female   DOB: 1932-12-30, 81 y.o.   MRN: [id number]         [facility]    Date of Admission:  07/20/2014           Day 8 vancomycin  Principal Problem:   HCAP (healthcare-associated pneumonia) Active Problems:   Dyspnea   Protein-calorie malnutrition, severe   Acute respiratory failure with hypoxia   . antiseptic oral rinse  7 mL Mouth Rinse q12n4p  . chlorhexidine  15 mL Mouth Rinse BID  . latanoprost  1 drop Both Eyes QHS  . levalbuterol  1.25 mg Nebulization 3 times per day  . mometasone-formoterol  2 puff Inhalation BID  . pantoprazole  40 mg Oral BID  . predniSONE  40 mg Oral Q breakfast  . sodium chloride  1 spray Each Nare BID  . sodium chloride  10-40 mL Intracatheter Q12H  . sodium chloride  3 mL Intravenous Q12H  . timolol  1 drop Left Eye Q breakfast  . vancomycin  750 mg Intravenous Q12H  . warfarin  2 mg Oral ONCE-1800  . Warfarin - Pharmacist Dosing Inpatient   Does not apply q1800  . zolpidem  5 mg Oral QHS    Subjective: She is feeling dramatically better compared with admission. She has only minimal cough and shortness of breath.  Review of Systems: Pertinent items are noted in HPI.  Past Medical History  Diagnosis Date  . Wegener's granulomatosis   . OA (osteoarthritis)   . Blindness of one eye   . Hearing loss in left ear     hearing aid both ears  . Bladder incontinence   . Glaucoma   . Clotting disorder   . History of pulmonary embolism 1997  . Shortness of breath dyspnea   . Pneumonia 12/15  . Multiple allergies   . Chronic sinusitis     History  Substance Use Topics  . Smoking status: Former Smoker -- 1.50 packs/day for 30 years    Types: Cigarettes    Quit date: 07/03/1984  . Smokeless tobacco: Not on file  . Alcohol Use: Yes     Comment: 1-2 glasses    Family History  Problem Relation Age of Onset  . Heart disease Father     stroke-deceased  . Stroke Father   .  Congestive Heart Failure Mother     deceased  . Heart disease Mother   . Uterine cancer Sister   . Lymphoma Sister   . Skin cancer Sister     no melanoma   Allergies  Allergen Reactions  . Adhesive [Tape] Rash    OBJECTIVE: Blood pressure 148/77, pulse 73, temperature 96.6 F (35.9 C), temperature source Axillary, resp. rate 18, height 5\' 2"  (1.575 m), weight 144 lb 2.9 oz (65.4 kg), SpO2 100 %. General: She is smiling and in good spirits Skin: Scattered ecchymoses Lungs: Clear Cor: Regular S1 and S2 with no murmur   Lab Results Lab Results  Component Value Date   WBC 10.1 07/27/2014   HGB 7.9* 07/27/2014   HCT 25.9* 07/27/2014   MCV 78.2 07/27/2014   PLT 351 07/27/2014    Lab Results  Component Value Date   CREATININE 0.68 07/27/2014   BUN 28* 07/26/2014   NA 140 07/26/2014   K 4.3 07/26/2014   CL 112 07/26/2014   CO2 24 07/26/2014    Lab Results  Component Value Date   ALT 43* 07/26/2014  AST 36 07/26/2014   ALKPHOS 90 07/26/2014   BILITOT 0.5 07/26/2014     Microbiology: Recent Results (from the past 240 hour(s))  Culture, blood (routine x 2)     Status: None   Collection Time: 07/20/14  1:20 PM  Result Value Ref Range Status   Specimen Description BLOOD RIGHT ANTECUBITAL  Final   Special Requests BOTTLES DRAWN AEROBIC AND ANAEROBIC 3ML  Final   Culture   Final    STAPHYLOCOCCUS SPECIES (COAGULASE NEGATIVE) Note: THE SIGNIFICANCE OF ISOLATING THIS ORGANISM FROM A SINGLE SET OF BLOOD CULTURES WHEN MULTIPLE SETS ARE DRAWN IS UNCERTAIN. PLEASE NOTIFY THE MICROBIOLOGY DEPARTMENT WITHIN ONE WEEK IF SPECIATION AND SENSITIVITIES ARE REQUIRED. Note: Gram Stain Report Called to,Read Back By and Verified With: JENNIFER HUFF 07/22/14 AT 1150 AM BY [facility] Performed at [facility]    Report Status 07/23/2014 FINAL  Final  Culture, blood (routine x 2)     Status: None   Collection Time: 07/20/14  1:22 PM  Result Value Ref Range Status   Specimen  Description BLOOD RIGHT FOREARM  Final   Special Requests BOTTLES DRAWN AEROBIC AND ANAEROBIC 3ML  Final   Culture   Final    NO GROWTH 5 DAYS Performed at [facility]    Report Status 07/26/2014 FINAL  Final  Urine culture     Status: None   Collection Time: 07/20/14  1:40 PM  Result Value Ref Range Status   Specimen Description URINE, CLEAN CATCH  Final   Special Requests NONE  Final   Colony Count NO GROWTH Performed at [facility]   Final   Culture NO GROWTH Performed at [facility]   Final   Report Status 07/21/2014 FINAL  Final  Culture, respiratory (NON-Expectorated)     Status: None   Collection Time: 07/22/14 12:56 PM  Result Value Ref Range Status   Specimen Description TRACHEAL ASPIRATE  Final   Special Requests NONE  Final   Gram Stain   Final    RARE WBC PRESENT, PREDOMINANTLY MONONUCLEAR RARE SQUAMOUS EPITHELIAL CELLS PRESENT FEW GRAM POSITIVE COCCI IN PAIRS IN CLUSTERS Performed at [facility]    Culture   Final    ABUNDANT METHICILLIN RESISTANT STAPHYLOCOCCUS AUREUS Note: RIFAMPIN AND GENTAMICIN SHOULD NOT BE USED AS SINGLE DRUGS FOR TREATMENT OF STAPH INFECTIONS. CRITICAL RESULT CALLED TO, READ BACK BY AND VERIFIED WITH: Clent Ridges RN [id number] 07/25/14 GUS Performed at [facility]    Report Status 07/25/2014 FINAL  Final   Organism ID, Bacteria METHICILLIN RESISTANT STAPHYLOCOCCUS AUREUS  Final      Susceptibility   Methicillin resistant staphylococcus aureus - MIC*    CLINDAMYCIN >=8 RESISTANT Resistant     ERYTHROMYCIN >=8 RESISTANT Resistant     GENTAMICIN <=0.5 SENSITIVE Sensitive     LEVOFLOXACIN >=8 RESISTANT Resistant     OXACILLIN >=4 RESISTANT Resistant     PENICILLIN >=0.5 RESISTANT Resistant     RIFAMPIN <=0.5 SENSITIVE Sensitive     TRIMETH/SULFA >=320 RESISTANT Resistant     VANCOMYCIN 2 SENSITIVE Sensitive     TETRACYCLINE >=16 RESISTANT Resistant     * ABUNDANT METHICILLIN RESISTANT STAPHYLOCOCCUS  AUREUS  Fungus Culture with Smear     Status: None (Preliminary result)   Collection Time: 07/22/14  1:05 PM  Result Value Ref Range Status   Specimen Description LUNG  Final   Special Requests NONE  Final   Fungal Smear   Final  NO YEAST OR FUNGAL ELEMENTS SEEN Performed at [facility]    Culture   Final    CULTURE IN PROGRESS FOR FOUR WEEKS Performed at [facility]    Report Status PENDING  Incomplete  MRSA PCR Screening     Status: None   Collection Time: 07/22/14  1:07 PM  Result Value Ref Range Status   MRSA by PCR NEGATIVE NEGATIVE Final    Comment:        The GeneXpert MRSA Assay (FDA approved for NASAL specimens only), is one component of a comprehensive MRSA colonization surveillance program. It is not intended to diagnose MRSA infection nor to guide or monitor treatment for MRSA infections.   Culture, respiratory (NON-Expectorated)     Status: None   Collection Time: 07/22/14  1:25 PM  Result Value Ref Range Status   Specimen Description BRONCHIAL ALVEOLAR LAVAGE  Final   Special Requests Immunocompromised  Final   Gram Stain   Final    FEW WBC PRESENT, PREDOMINANTLY MONONUCLEAR RARE SQUAMOUS EPITHELIAL CELLS PRESENT FEW GRAM POSITIVE COCCI IN PAIRS IN CLUSTERS Performed at [facility]    Culture   Final    FEW METHICILLIN RESISTANT STAPHYLOCOCCUS AUREUS Note: RIFAMPIN AND GENTAMICIN SHOULD NOT BE USED AS SINGLE DRUGS FOR TREATMENT OF STAPH INFECTIONS. CRITICAL RESULT CALLED TO, READ BACK BY AND VERIFIED WITH: Clent Ridges RN [id number] 07/25/14 GUSTK Performed at [facility]    Report Status 07/25/2014 FINAL  Final   Organism ID, Bacteria METHICILLIN RESISTANT STAPHYLOCOCCUS AUREUS  Final      Susceptibility   Methicillin resistant staphylococcus aureus - MIC*    CLINDAMYCIN >=8 RESISTANT Resistant     ERYTHROMYCIN >=8 RESISTANT Resistant     GENTAMICIN <=0.5 SENSITIVE Sensitive     LEVOFLOXACIN >=8 RESISTANT Resistant      OXACILLIN >=4 RESISTANT Resistant     PENICILLIN >=0.5 RESISTANT Resistant     RIFAMPIN <=0.5 SENSITIVE Sensitive     TRIMETH/SULFA >=320 RESISTANT Resistant     VANCOMYCIN 2 SENSITIVE Sensitive     TETRACYCLINE >=16 RESISTANT Resistant     * FEW METHICILLIN RESISTANT STAPHYLOCOCCUS AUREUS  Fungus Culture with Smear     Status: None (Preliminary result)   Collection Time: 07/22/14  1:27 PM  Result Value Ref Range Status   Specimen Description BRONCHIAL ALVEOLAR LAVAGE  Final   Special Requests Immunocompromised  Final   Fungal Smear   Final    NO YEAST OR FUNGAL ELEMENTS SEEN Performed at [facility]    Culture   Final    CULTURE IN PROGRESS FOR FOUR WEEKS Performed at [facility]    Report Status PENDING  Incomplete  AFB culture with smear     Status: None (Preliminary result)   Collection Time: 07/22/14  1:27 PM  Result Value Ref Range Status   Specimen Description BRONCHIAL ALVEOLAR LAVAGE  Final   Special Requests Immunocompromised  Final   Acid Fast Smear   Final    NO ACID FAST BACILLI SEEN Performed at [facility]    Culture   Final    CULTURE WILL BE EXAMINED FOR 6 WEEKS BEFORE ISSUING A FINAL REPORT Performed at [facility]    Report Status PENDING  Incomplete    Assessment: It appears that her initial problems were due to airway obstruction and mucus plugging. Bronchoscopy cultures have grown only MRSA. There is no evidence of recurrent aspergillus or other fungal infections by ENT exam,  chest x-ray, BAL stains and cultures or pathology. I think it is appropriate to stop vancomycin and observe off of antibiotics.  Plan: 1. Discontinue vancomycin and observe off of antibiotics 2. I will sign off now  Michel Bickers, MD [facility] [place] [phone] pager   [phone] cell 07/27/2014, 1:26 PM

## 2014-07-27 NOTE — Progress Notes (Signed)
Spiritual care follow up for continued support.  Pt in good spirits.  Concerned about discharge due to caring for husband who has dementia.  Well-supported by immediate family.   Will continue to follow for support as needed.    [place], [place]

## 2014-07-27 NOTE — Progress Notes (Signed)
Physical Therapy Treatment Patient Details Name: [name] MRN: [id number] DOB: 10-14-1932 Today's Date: 07/27/2014    History of Present Illness 81 yo female admitted 07/20/14 with worsening SOB/HCAP/MRSA trachea aspirated.    PT Comments    Pt's daughter in room and asked PT questions regarding recommendation for SNF vs home with 24/7 care.  Explained continued recommendation for SNF upon d/c at this time due to decreased endurance, concern for possible falls, possible need for O2 upon d/c, ability to perform self care tasks.  Daughter expressed thanks for explaining and left to meet with [facility] regarding d/c plan.  Pt very cooperative and pleasant although also HOH.  Pt only able to tolerate short distance ambulation in hallway.   Follow Up Recommendations  SNF;Supervision/Assistance - 24 hour     Equipment Recommendations  Rolling walker with 5" wheels    Recommendations for Other Services       Precautions / Restrictions Precautions Precautions: Fall Precaution Comments: requires oxygen    Mobility  Bed Mobility Overal bed mobility: Needs Assistance Bed Mobility: Supine to Sit;Sit to Supine     Supine to sit: Min assist Sit to supine: Min assist   General bed mobility comments: increased time, assist for scooting to EOB, legs onto bed  Transfers Overall transfer level: Needs assistance Equipment used: Rolling walker (2 wheeled) Transfers: Sit to/from Stand Sit to Stand: Min assist         General transfer comment: assist to rise and steady, verbal cues for safe technique  Ambulation/Gait Ambulation/Gait assistance: +2 safety/equipment;Min assist Ambulation Distance (Feet): 60 Feet Assistive device: Rolling walker (2 wheeled) Gait Pattern/deviations: Step-through pattern;Trunk flexed     General Gait Details: verbal cues for safe use of RW, pt reports slight dizziness, SpO2 87% on room air, improved with rest break and breathing however only to  90%, reapplied face tent upon return to room and SpO2 100%   Stairs            Wheelchair Mobility    Modified Rankin (Stroke Patients Only)       Balance                                    Cognition Arousal/Alertness: Awake/alert Behavior During Therapy: WFL for tasks assessed/performed Overall Cognitive Status: Within Functional Limits for tasks assessed                      Exercises      General Comments        Pertinent Vitals/Pain  No pain reported SpO2 95% room air at rest SpO2 87% room air during gait, improved to 90% room air with rest break and breathing Reapplied face tent upon return to room    Home Living                      Prior Function            PT Goals (current goals can now be found in the care plan section) Progress towards PT goals: Progressing toward goals    Frequency  Min 3X/week    PT Plan Current plan remains appropriate    Co-evaluation             End of Session Equipment Utilized During Treatment: Gait belt Activity Tolerance: Patient tolerated treatment well Patient left: in bed;with call bell/phone within reach;with bed  alarm set     Time: 2694-8546 PT Time Calculation (min) (ACUTE ONLY): 18 min  Charges:  $Gait Training: 8-22 mins                    G Codes:      Kimba Lottes,KATHrine E 2014-07-31, 4:17 PM Carmelia Bake, PT, DPT 07/31/14 Pager: [phone]

## 2014-07-27 NOTE — Progress Notes (Addendum)
[facility] PROGRESS NOTE  [name] ZYS:[id number] DOB: 1933/03/27 DOA: 07/20/2014 PCP:  [name], MD  Assessment/Plan: Principal Problem:   HCAP (healthcare-associated pneumonia) Active Problems:   Dyspnea   Protein-calorie malnutrition, severe   Acute respiratory failure with hypoxia    Acute Respiratory Failure with Hypoxemia > slow to improve, status post extubation 1/22 Respiratory failure secondary to  fibrinous mass (fungal?) resolved Stridor> still occuring 1/23 am, seems to be associated with secretion management and also anxiety. 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 He recommends to maximize humidity, Face shield with humidified oxygen Continue Xopenex Q8 + Q6 PRN  Continue Dulera , continue raceepinephrine Fungal culture, AFB, negative bronchoalveolar lavage culture shows MRSA Restarted vancomycin 1/23, infectious disease consultation obtained and they recommend to continue vancomycin Dr. Megan Salon to reassess which antibiotics the patient needs Assessment form oxygen needs, will ambulate patient on room air and 2 L to document need   Sinus tachycardia resolved Continue gentle hydration   GI prophylaxis-switch Protonix to by mouth    Normocytic Anemia ,Hx PE on chronic anticoagulation FOBT negative Continue coumadin per pharmacy  Hemoglobin stable, actually improving, hemoglobin 7.9 today Discussed with the patient's daughter, because of her tenuous respiratory status no inpatient workup is being considered    Suspect fungal sinusitis  Staph species 1/2 BC >  likely contaminant Doubt HCAP BCx2 1/18 >> few coag neg staph species 1/2  UC 1/19 >> neg BAL 1/20 >> few GPC in clusters, MRSA AFB (bronch) 1/20 >>  Fungal (bronch) 1/20 >>  Cytology (fibrinous mass) 1/20 >>  Culture (finbrinous mass) 1/20 >> MRSA Fungal (fibrinous mass) 1/20 >>  Vanco, start date 1/18, day 5/5 (Stopped) Zosyn, start date 1/18, day 5/5 (Stopped) Vancomycin restarted 1/23 Infectious disease to make further recommendations about antibiotic regimen    Steroid-induced hyperglycemia SSI, sensitive scale with steroids   Generalized weakness Start ambulating patient with physical therapy once FiO2 is tapered down to maintain saturation greater than 92% Patient will need SNF Transfer to telemetry Social work consult     Code Status: full Family Communication: family updated about patient's clinical progress Disposition Plan:  Continue telemetry, SNF probably ready tomorrow    Brief narrative: 81 year old female from SNF, history of Wegener's granulomatosis followed by [name], asthma who presents to the ER because of worsening shortness of breath, fever 103. The patient has not been feeling well for a couple of months gradually worsening over the last 3-4 weeks. The patient states that 2 months ago she was diagnosed with anemia, hemoglobin significantly lower than baseline. She did not have any extensive workup other than been provided with a referral to see [name]. This consult has not taken place as of yet. She denies any hematochezia, melena. She describes 15 pound weight loss in the last 2-3 years. She subsequently fell ill Christmas Eve, so her PCP office and he placed her on levofloxacin for 7 days for suspected pneumonia. Patient was subsequently seen at urgent care on 1/8 for weakness and persistent cough rx sx . In the ED the patient was found to have a normal white count of 5.4. Chest x-ray shows  emphysema with increased interstitial  markings  Consultants:  Pulmonary critical care  ENT  STUDIES:  1/19 CT Chest >> thin membranous appearing structure in proximal trachea beneath the vocal cords of uncertain etiology, mild CHF, multiple nodules throughout lungs bilaterally L>R, no new large cavitary lesions  SIGNIFICANT EVENTS: 1/18 Admit with weight loss, fever 103, weakness & persistent cough 1/19 PCCM consulted for evaluation  1/20 Decompensated with stridor, to ICU for emergent intubation > large black fibrinous mass removed from trachea 1/22 Extubated   Antibiotics: Vanco, start date 1/18, day 5/5 (Stopped) Zosyn, start date 1/18, day 5/5 (Stopped) Vancomycin restarted 1/23  HPI/Subjective: Sitting in the chair, as is a face   tent in place, able to speak in full sentences . Daughter by the bedside, patient seems to be improving on a daily basis, no chest pain or shortness of breath at rest, no nausea vomiting   Objective: Filed Vitals:   07/26/14 2134 07/26/14 2200 07/27/14 0445 07/27/14 0841  BP: 146/92  148/77   Pulse: 75  73   Temp: 97.8 F (36.6 C)  96.6 F (35.9 C)   TempSrc: Oral  Axillary   Resp: 16  18   Height:      Weight:      SpO2: 100% 100% 100% 100%    Intake/Output Summary (Last 24 hours) at 07/27/14 1317 Last data filed at 07/27/14 0619  Gross per 24 hour  Intake   1020 ml  Output   1400 ml  Net   -380 ml    Exam:  General: alert & oriented x 3, weak cough, generally weak Cardiovascular: RRR, nl S1 s2  Respiratory: Decreased breath sounds at the bases, scattered rhonchi, no crackles  Abdomen: soft +BS NT/ND, no masses palpable  Extremities: No cyanosis and no edema      Data Reviewed: Basic Metabolic Panel:  Recent Labs Lab 07/20/14 1713 07/21/14 0350 07/22/14 0700 07/23/14 0400 07/24/14 0420 07/26/14 0530 07/27/14 0530  NA  --  140 138 141 142 140  --   K  --  3.7 4.0 4.0 4.4 4.3  --   CL  --  108 108 113* 114* 112  --   CO2  --  25 24 24 23 24    --   GLUCOSE  --  105* 178* 140* 159* 146*  --   BUN  --  6 12 19  29* 28*  --   CREATININE  --  0.67 0.66 0.70 0.82 0.61 0.68  CALCIUM  --  8.8 8.9 8.5 9.0 8.5  --   MG 2.0  --   --   --   --   --   --     Liver Function Tests:  Recent Labs Lab 07/20/14 1713 07/21/14 0350 07/22/14 0700 07/23/14 0400 07/26/14 0530  AST 22 24 23  36 36  ALT 12 11 14 23  43*  ALKPHOS 100 94 97 97 90  BILITOT 0.4 0.4 0.6 0.4 0.5  PROT 5.7* 5.4* 5.9* 5.0* 5.0*  ALBUMIN 2.8* 2.5* 2.7* 2.3* 2.3*   No results for input(s): LIPASE, AMYLASE in the last 168 hours. No results for input(s): AMMONIA in the last 168 hours.  CBC:  Recent Labs Lab 07/23/14 0400 07/24/14 0420 07/25/14 1038 07/26/14 0530 07/27/14 0530  WBC 10.8* 8.8 8.2 6.6 10.1  NEUTROABS  --  8.1*  --   --   --   HGB 7.4* 8.1* 7.7* 7.7* 7.9*  HCT 24.0* 26.8* 25.2* 25.3* 25.9*  MCV 80.3 80.0 79.0 79.1 78.2  PLT 384 421* 389 383 351    Cardiac Enzymes:  Recent Labs Lab 07/20/14 1320 07/20/14 1612 07/20/14 1613 07/21/14 0350  TROPONINI 0.04* 0.20* 0.22* 0.17*   BNP (last 3 results) No results for input(s): PROBNP in the last 8760 hours.   CBG:  Recent Labs Lab 07/25/14 2036 07/26/14 0052 07/26/14 0521 07/26/14 0825 07/26/14 1218  GLUCAP 172* 128* 137* 145* 144*    Recent Results (from the past 240 hour(s))  Culture, blood (routine x 2)     Status: None   Collection Time: 07/20/14  1:20 PM  Result Value Ref Range Status   Specimen Description BLOOD RIGHT ANTECUBITAL  Final   Special Requests BOTTLES DRAWN AEROBIC AND ANAEROBIC 3ML  Final   Culture   Final    STAPHYLOCOCCUS SPECIES (COAGULASE NEGATIVE) Note: THE SIGNIFICANCE OF ISOLATING THIS ORGANISM FROM A SINGLE SET OF BLOOD CULTURES WHEN MULTIPLE SETS ARE DRAWN IS UNCERTAIN. PLEASE NOTIFY THE MICROBIOLOGY DEPARTMENT WITHIN ONE WEEK IF SPECIATION AND SENSITIVITIES ARE REQUIRED. Note: Gram Stain Report Called to,Read Back By and Verified With: JENNIFER HUFF  07/22/14 AT 1150 AM BY [facility] Performed at [facility]    Report Status 07/23/2014 FINAL  Final  Culture, blood (routine x 2)     Status: None   Collection Time: 07/20/14  1:22 PM  Result Value Ref Range Status   Specimen Description BLOOD RIGHT FOREARM  Final   Special Requests BOTTLES DRAWN AEROBIC AND ANAEROBIC 3ML  Final   Culture   Final    NO GROWTH 5 DAYS Performed at [facility]    Report Status 07/26/2014 FINAL  Final  Urine culture     Status: None   Collection Time: 07/20/14  1:40 PM  Result Value Ref Range Status   Specimen Description URINE, CLEAN CATCH  Final   Special Requests NONE  Final   Colony Count NO GROWTH Performed at [facility]   Final   Culture NO GROWTH Performed at [facility]   Final   Report Status 07/21/2014 FINAL  Final  Culture, respiratory (NON-Expectorated)     Status: None   Collection Time: 07/22/14 12:56 PM  Result Value Ref Range Status   Specimen Description TRACHEAL ASPIRATE  Final   Special Requests NONE  Final   Gram Stain   Final    RARE WBC PRESENT, PREDOMINANTLY MONONUCLEAR RARE SQUAMOUS EPITHELIAL CELLS PRESENT FEW GRAM POSITIVE COCCI IN PAIRS IN CLUSTERS Performed at [facility]    Culture   Final    ABUNDANT METHICILLIN RESISTANT STAPHYLOCOCCUS AUREUS Note: RIFAMPIN AND GENTAMICIN SHOULD NOT BE USED AS SINGLE DRUGS FOR TREATMENT OF STAPH INFECTIONS. CRITICAL RESULT CALLED TO, READ BACK BY AND VERIFIED WITH: Clent Ridges RN [id number] 07/25/14 GUS Performed at [facility]    Report Status 07/25/2014 FINAL  Final   Organism ID, Bacteria METHICILLIN RESISTANT STAPHYLOCOCCUS AUREUS  Final      Susceptibility   Methicillin resistant staphylococcus aureus - MIC*    CLINDAMYCIN >=8 RESISTANT Resistant     ERYTHROMYCIN >=8 RESISTANT Resistant     GENTAMICIN <=0.5 SENSITIVE Sensitive     LEVOFLOXACIN >=8 RESISTANT Resistant     OXACILLIN >=4 RESISTANT Resistant     PENICILLIN >=0.5  RESISTANT Resistant     RIFAMPIN <=0.5 SENSITIVE Sensitive     TRIMETH/SULFA >=320 RESISTANT Resistant     VANCOMYCIN 2 SENSITIVE Sensitive     TETRACYCLINE >=16 RESISTANT Resistant     *  ABUNDANT METHICILLIN RESISTANT STAPHYLOCOCCUS AUREUS  Fungus Culture with Smear     Status: None (Preliminary result)   Collection Time: 07/22/14  1:05 PM  Result Value Ref Range Status   Specimen Description LUNG  Final   Special Requests NONE  Final   Fungal Smear   Final    NO YEAST OR FUNGAL ELEMENTS SEEN Performed at [facility]    Culture   Final    CULTURE IN PROGRESS FOR FOUR WEEKS Performed at [facility]    Report Status PENDING  Incomplete  MRSA PCR Screening     Status: None   Collection Time: 07/22/14  1:07 PM  Result Value Ref Range Status   MRSA by PCR NEGATIVE NEGATIVE Final    Comment:        The GeneXpert MRSA Assay (FDA approved for NASAL specimens only), is one component of a comprehensive MRSA colonization surveillance program. It is not intended to diagnose MRSA infection nor to guide or monitor treatment for MRSA infections.   Culture, respiratory (NON-Expectorated)     Status: None   Collection Time: 07/22/14  1:25 PM  Result Value Ref Range Status   Specimen Description BRONCHIAL ALVEOLAR LAVAGE  Final   Special Requests Immunocompromised  Final   Gram Stain   Final    FEW WBC PRESENT, PREDOMINANTLY MONONUCLEAR RARE SQUAMOUS EPITHELIAL CELLS PRESENT FEW GRAM POSITIVE COCCI IN PAIRS IN CLUSTERS Performed at [facility]    Culture   Final    FEW METHICILLIN RESISTANT STAPHYLOCOCCUS AUREUS Note: RIFAMPIN AND GENTAMICIN SHOULD NOT BE USED AS SINGLE DRUGS FOR TREATMENT OF STAPH INFECTIONS. CRITICAL RESULT CALLED TO, READ BACK BY AND VERIFIED WITH: Clent Ridges RN [id number] 07/25/14 GUSTK Performed at [facility]    Report Status 07/25/2014 FINAL  Final   Organism ID, Bacteria METHICILLIN RESISTANT STAPHYLOCOCCUS AUREUS  Final       Susceptibility   Methicillin resistant staphylococcus aureus - MIC*    CLINDAMYCIN >=8 RESISTANT Resistant     ERYTHROMYCIN >=8 RESISTANT Resistant     GENTAMICIN <=0.5 SENSITIVE Sensitive     LEVOFLOXACIN >=8 RESISTANT Resistant     OXACILLIN >=4 RESISTANT Resistant     PENICILLIN >=0.5 RESISTANT Resistant     RIFAMPIN <=0.5 SENSITIVE Sensitive     TRIMETH/SULFA >=320 RESISTANT Resistant     VANCOMYCIN 2 SENSITIVE Sensitive     TETRACYCLINE >=16 RESISTANT Resistant     * FEW METHICILLIN RESISTANT STAPHYLOCOCCUS AUREUS  Fungus Culture with Smear     Status: None (Preliminary result)   Collection Time: 07/22/14  1:27 PM  Result Value Ref Range Status   Specimen Description BRONCHIAL ALVEOLAR LAVAGE  Final   Special Requests Immunocompromised  Final   Fungal Smear   Final    NO YEAST OR FUNGAL ELEMENTS SEEN Performed at [facility]    Culture   Final    CULTURE IN PROGRESS FOR FOUR WEEKS Performed at [facility]    Report Status PENDING  Incomplete  AFB culture with smear     Status: None (Preliminary result)   Collection Time: 07/22/14  1:27 PM  Result Value Ref Range Status   Specimen Description BRONCHIAL ALVEOLAR LAVAGE  Final   Special Requests Immunocompromised  Final   Acid Fast Smear   Final    NO ACID FAST BACILLI SEEN Performed at [facility]    Culture   Final    CULTURE WILL BE EXAMINED FOR 6 WEEKS  BEFORE ISSUING A FINAL REPORT Performed at [facility]    Report Status PENDING  Incomplete     Studies: Dg Chest 2 View (if Patient Has Fever And/or Copd)  07/20/2014   CLINICAL DATA:  Cough and shortness of breath for 1 month.  EXAM: CHEST  2 VIEW  COMPARISON:  Chest x-rays dated 07/10/2014 and 07/08/2014 and 06/25/2014 and chest CT dated 09/24/2013  FINDINGS: The heart size and pulmonary vascularity are normal. There are multiple areas of scarring in both lungs, particularly in the right upper lobe and left lung base. There is a  small area of scarring laterally at the left lung base as well as posterior medially. There is a tiny right pleural effusion, unchanged since 06/25/2014 but new since 09/24/2013.  The markings are slightly more accentuated at the bases than on the prior study.  No acute osseous abnormalities. There are emphysematous changes bilaterally.  IMPRESSION: Emphysema with scarring in both lungs. Slight increased accentuation of the interstitial markings could represent superimposed bronchitis. Persistent small right pleural effusion.   Electronically Signed   By: Rozetta Nunnery M.D.   On: 07/20/2014 14:17   Dg Chest 2 View  07/10/2014   CLINICAL DATA:  Cough and congestion .  EXAM: CHEST  2 VIEW  COMPARISON:  07/08/2014.  06/25/2014.  FINDINGS: Mediastinum and hilar structures normal. Stable diffuse bilateral pulmonary interstitial mild prominence noted suggesting chronic interstitial lung disease. Pleural parenchymal thickening consistent with scarring. COPD. Heart size is stable. No pulmonary venous congestion. Scoliosis thoracic spine with diffuse degenerative change.  IMPRESSION: Stable mild interstitial prominence consistent with chronic interstitial lung disease. Pleural parenchymal scarring. COPD. No acute cardiopulmonary disease.   Electronically Signed   By: Marcello Moores  Register   On: 07/10/2014 12:32   Ct Chest W Contrast  07/21/2014   CLINICAL DATA:  81 year old female with history of small vessel vasculitis (granulomatous polyangitis) with history of airway stenosis, presenting with loud wheeze and scant hemoptysis. Evaluate for potential parenchymal involvement of the lungs versus airway stenosis. Shortness of breath, wheezing and fever for 1 month.  EXAM: CT CHEST WITH CONTRAST  TECHNIQUE: Multidetector CT imaging of the chest was performed during intravenous contrast administration.  CONTRAST:  50mL OMNIPAQUE IOHEXOL 300 MG/ML  SOLN  COMPARISON:  Chest CT 09/24/2013.  FINDINGS: Comment: Examination is limited  by considerable patient respiratory motion.  Mediastinum/Lymph Nodes: Heart size is mildly enlarged. There is no significant pericardial fluid, thickening or pericardial calcification. Calcifications of the mitral annulus. No pathologically enlarged mediastinal or hilar lymph nodes. Esophagus is unremarkable in appearance.  Lungs/Pleura: In the proximal trachea shortly beneath the level of the vocal cords There is some thin irregular-shaped soft tissue which has the appearance of a small membrane, which appears intimately associated with the wall of the trachea. The trachea and mainstem bronchi are otherwise widely patent. Again noted are nodular appearing areas of architectural distortion in the lungs bilaterally, most notably in the left lower lobe where there is a 1.8 x 1.3 cm spiculated appearing nodular density in the superior segment of the left lower lobe, and a 2.4 x 1.5 cm spiculated appearing nodular density in the posterior aspect of the left lower lobe. Multiple other smaller nodules are noted. These two largest lesions appear more prominent than the prior study from 09/24/2013, however, when compared to more remote prior studies such that is 01/03/2011, both of these lesions appear slightly smaller, and are presumably areas of chronic postinflammatory scarring given the patient's history  of vasculitis. There is a background of mild diffuse ground-glass attenuation with mild interlobular septal thickening, suggesting a background of mild interstitial pulmonary edema. Moderate bilateral pleural effusions are noted. Large bulla in the apex of the left hemithorax is unchanged.  Musculoskeletal/Soft Tissues: There are no aggressive appearing lytic or blastic lesions noted in the visualized portions of the skeleton.  Upper Abdomen: Unremarkable.  IMPRESSION: 1. There is a thin membranous appearing structure in the proximal trachea immediately beneath the level the vocal cords, which is of uncertain etiology  and significance, but may account for the patient's upper airway wheezing on physical examination. Consideration for direct inspection with bronchoscopy is recommended if clinically appropriate. 2. The appearance of the chest suggests mild congestive heart failure, as above. 3. Multiple nodules throughout the lungs bilaterally (left greater than right), similar in appearance to numerous prior examinations, presumably sequela of underlying Wegner's granulomatosis. No new large cavitary lesion to suggest active parenchymal involvement at this time. 4. Mild cardiomegaly.   Electronically Signed   By: Vinnie Langton M.D.   On: 07/21/2014 13:01   Ct Maxillofacial W/cm  07/22/2014   CLINICAL DATA:  Wegner's granulomatosis. Question fungal sinusitis as cause of airway debris.  EXAM: CT MAXILLOFACIAL WITH CONTRAST  TECHNIQUE: Multidetector CT imaging of the maxillofacial structures was performed with intravenous contrast. Multiplanar CT image reconstructions were also generated. A small metallic BB was placed on the right temple in order to reliably differentiate right from left.  CONTRAST:  145mL OMNIPAQUE IOHEXOL 300 MG/ML  SOLN  COMPARISON:  05/08/2008  FINDINGS: Chronic pansinusitis status post medial maxillary antrostomies, bilateral turbinectomies, and ethmoidectomies. The right sphenoid sinus has also been opened. Mucosal thickening is diffuse throughout the sinuses and there are small layering effusions within the maxillary and right sphenoid sinuses. The surgical openings are patent, although the right sphenoidotomy is moderately narrowed by mucosal disease. There is a small perforation in the anterior lower nasal septum. These changes are all consistent with history of Wegner's granulomatosis. Concerning the clinical question of fungal sinusitis, there certainly could be fungal colonization (some of the sinus material is high-density), but no fungal mass or invasive sinusitis is detected to explain the  obstructive airway mass on bronchoscopy.  Complete opacification of the bilateral mastoid and middle ear cavities. No wrist changes.  Bilateral cataract resection.  Otherwise, the orbits are negative.  Limited evaluation of the oral cavity, oropharynx, and hypopharynx due to intubation with retained fluid.  IMPRESSION: 1. Wegner's granulomatosis with pansinusitis and otomastoiditis described above. 2. Concerning the clinical question, there may be sinus fungal colonization but there is no visible mycetoma or invasive sinusitis.   Electronically Signed   By: Jorje Guild M.D.   On: 07/22/2014 22:34   Dg Chest Port 1 View  07/24/2014   CLINICAL DATA:  Acute respiratory failure.  Hypoxia.  EXAM: PORTABLE CHEST - 1 VIEW  COMPARISON:  07/23/2014  FINDINGS: Left PICC line tip: Lower SVC. Mild enlargement of the cardiopericardial silhouette diffuse interstitial accentuation. Suspected layering left pleural effusion. Left mid lung hazy opacity mildly improved.  Atherosclerotic calcification of the thoracic aorta. Probable small right pleural effusion. Obscuration of the left hemidiaphragm, probably due to the pleural effusion and passive atelectasis.  IMPRESSION: 1. The dominant changes reduced hazy density in the left mid lung probably due to redistribution of left pleural effusion. There may be some slight improvement in the underlying interstitial accentuation which is probably a manifestation of pulmonary edema, less likely from atypical infectious  process.   Electronically Signed   By: Sherryl Barters M.D.   On: 07/24/2014 10:13   Dg Chest Port 1 View  07/23/2014   CLINICAL DATA:  Acute respiratory failure  EXAM: PORTABLE CHEST - 1 VIEW  COMPARISON:  07/22/2014  FINDINGS: Cardiac shadow is stable. An endotracheal tube, left-sided PICC line and nasogastric catheter are stable in appearance. Increasing density is noted in the left retrocardiac region consistent with atelectasis. Diffuse vascular congestion is  again identified.  IMPRESSION: Increasing left lower lobe atelectasis. The remainder of the exam is stable.   Electronically Signed   By: Inez Catalina M.D.   On: 07/23/2014 07:13   Dg Chest Port 1 View  07/22/2014   CLINICAL DATA:  Respiratory failure.  Endotracheal tube placement.  EXAM: PORTABLE CHEST - 1 VIEW  COMPARISON:  07/20/2014  FINDINGS: The endotracheal to is 3.5 cm above the carina. The NG tube is in the stomach. The cardiac silhouette, mediastinal and hilar contours are stable. Moderate pulmonary edema. Small pleural effusions.  IMPRESSION: The endotracheal tube and NG tubes are in good position.  Moderate pulmonary edema.   Electronically Signed   By: Kalman Jewels M.D.   On: 07/22/2014 14:21   Dg Chest Port 1 View  07/21/2014   CLINICAL DATA:  F/u SOB, droplet precautions, hx. Asthma, fever, cough and congestion, chest tightness and wheezing  EXAM: PORTABLE CHEST - 1 VIEW  COMPARISON:  the previous day's study  FINDINGS: Increase in diffuse interstitial infiltrates or edema, with peripheral septal lines and some patchy new airspace opacities in bilateral lung bases. Heart size upper limits normal for technique. No effusion. Mild spondylitic changes in the mid thoracic spine.  IMPRESSION: 1. Progressive bilateral edema/infiltrates as above.   Electronically Signed   By: Arne Cleveland M.D.   On: 07/21/2014 11:02    Scheduled Meds: . antiseptic oral rinse  7 mL Mouth Rinse q12n4p  . chlorhexidine  15 mL Mouth Rinse BID  . latanoprost  1 drop Both Eyes QHS  . levalbuterol  1.25 mg Nebulization 3 times per day  . mometasone-formoterol  2 puff Inhalation BID  . pantoprazole  40 mg Oral BID  . predniSONE  40 mg Oral Q breakfast  . sodium chloride  1 spray Each Nare BID  . sodium chloride  10-40 mL Intracatheter Q12H  . sodium chloride  3 mL Intravenous Q12H  . timolol  1 drop Left Eye Q breakfast  . vancomycin  750 mg Intravenous Q12H  . warfarin  2 mg Oral ONCE-1800  . Warfarin -  Pharmacist Dosing Inpatient   Does not apply q1800  . zolpidem  5 mg Oral QHS   Continuous Infusions:    Principal Problem:   HCAP (healthcare-associated pneumonia) Active Problems:   Dyspnea   Protein-calorie malnutrition, severe   Acute respiratory failure with hypoxia    Time spent: 40 minutes   [facility] Pager [phone]. If 7PM-7AM, please contact night-coverage at [email or web address], password [facility] 07/27/2014, 1:17 PM  LOS: 7 days

## 2014-07-27 NOTE — Progress Notes (Signed)
Received call from pharmacy regarding vancomycin administration to clarify order.  Discussed abx with ID (Dr. Megan Salon).    Plan: D/C vancomycin Monitor off abx Trend fever curve / WBC Sputum sample likely colonization.    Noe Gens, NP-C [facility] Pgr: [phone] or [phone]

## 2014-07-27 NOTE — Progress Notes (Signed)
PULMONARY / CRITICAL CARE MEDICINE   Name: [name] MRN: [id number] DOB: October 16, 1932    ADMISSION DATE:  07/20/2014 CONSULTATION DATE:  07/21/14  REFERRING MD :  Dr. Allyson Sabal   CHIEF COMPLAINT:  Dyspnea, cough   INITIAL PRESENTATION: 81 y/o female patient of [name] with with granulomatous polyangitis and asthma was admitted on 1/19 with dyspnea, fever, wheeze.  STUDIES:  1/19  CT Chest >> thin membranous appearing structure in proximal trachea beneath the vocal cords of uncertain etiology, mild CHF, multiple nodules throughout lungs bilaterally L>R, no new large cavitary lesions 1/24  LUE Doppler >> no DVT, superficial thrombus in L cephalic vein   SIGNIFICANT EVENTS: 1/18  Admit with weight loss, fever 103, weakness & persistent cough 1/19  PCCM consulted for evaluation  1/20  Decompensated with stridor, to ICU for emergent intubation > large black fibrinous mass removed from trachea 1/22  Extubated 1/24  To Floor  SUBJECTIVE:  Pt denies acute events.  Asking about possible needs for discharge.   On face tent 28%   VITAL SIGNS: Temp:  [96.6 F (35.9 C)-99.5 F (37.5 C)] 96.6 F (35.9 C) (01/25 0445) Pulse Rate:  [73-76] 73 (01/25 0445) Resp:  [15-21] 18 (01/25 0445) BP: (146-169)/(72-92) 148/77 mmHg (01/25 0445) SpO2:  [100 %] 100 % (01/25 0841) FiO2 (%):  [35 %] 35 % (01/25 0841)    VENTILATOR SETTINGS: Vent Mode:  [-]  FiO2 (%):  [35 %] 35 %   INTAKE / OUTPUT:  Intake/Output Summary (Last 24 hours) at 07/27/14 1014 Last data filed at 07/27/14 4098  Gross per 24 hour  Intake   1020 ml  Output   1650 ml  Net   -630 ml    PHYSICAL EXAMINATION: General:  Elderly female in NAD  HEENT: NCAT EOMi, no stridor PULM: Respirations even/non-labored on face tent (for moisture), lungs diminished bilaterally but clear  CV: RRR, no mgr AB: BS+, soft, nontender Ext: warm, no edema Neuro: Pleasant, AAOx4.  HOH.  Speech clear, MAE    LABS:  CBC  Recent  Labs Lab 07/25/14 1038 07/26/14 0530 07/27/14 0530  WBC 8.2 6.6 10.1  HGB 7.7* 7.7* 7.9*  HCT 25.2* 25.3* 25.9*  PLT 389 383 351   Coag's  Recent Labs Lab 07/25/14 0530 07/26/14 0530 07/27/14 0530  INR 2.60* 2.87* 2.75*   BMET  Recent Labs Lab 07/23/14 0400 07/24/14 0420 07/26/14 0530 07/27/14 0530  NA 141 142 140  --   K 4.0 4.4 4.3  --   CL 113* 114* 112  --   CO2 24 23 24   --   BUN 19 29* 28*  --   CREATININE 0.70 0.82 0.61 0.68  GLUCOSE 140* 159* 146*  --    Electrolytes  Recent Labs Lab 07/20/14 1713  07/23/14 0400 07/24/14 0420 07/26/14 0530  CALCIUM  --   < > 8.5 9.0 8.5  MG 2.0  --   --   --   --   < > = values in this interval not displayed.   ABG  Recent Labs Lab 07/22/14 1414 07/25/14 0854  PHART 7.341* 7.354  PCO2ART 41.1 36.1  PO2ART 354.0* 193.0*     Liver Enzymes  Recent Labs Lab 07/22/14 0700 07/23/14 0400 07/26/14 0530  AST 23 36 36  ALT 14 23 43*  ALKPHOS 97 97 90  BILITOT 0.6 0.4 0.5  ALBUMIN 2.7* 2.3* 2.3*   Imaging No results found.   ASSESSMENT / PLAN:  PULMONARY OETT 1/20 >> 1/22 A: Acute Respiratory Failure with Hypoxemia - due to fibrinous mass (positive for MRSA, likely colonization, neg path), Resolved Stridor - last episode 1/23 am, seems to be associated with secretion management and also anxiety. ENT eval reassuring Sinusitis - see previous notes discussion Hx Aspergillosis but galactomannan Ag negative on 1/18 Granulomatosis polyangitis but no clear evidence of active lung involvement 1/21 Asthma, without acute exacerbation P:   ENT eval > no evidence to support fungal sinus disease, cx's pending but negative so far Attempt to start [place] bid Continue Xopenex Q8 + Q6 PRN  Dulera  Change solumedrol to prednisone 1/24, slow taper to off over two weeks.  Not prednisone dependent at baseline.  Pulmonary follow up arranged - see discharge navigator.  Assess for oxygen needs prior to discharge    CARDIOVASCULAR LUE PICC 1/20 >>1/25  A:  Tachycardia - resolved PICC 1/21 P:  Supportive care KVO fluids  HEMATOLOGIC A:   Wegener's Granulomatosis  Normocytic Anemia  Hx PE  Coagulopathy - chronic coumadin  Superficial Thrombus L Cephalic Vein - noted on prelim 1/24 [place] P:  Continue coumadin per pharmacy  D/C PICC line at discharge  INFECTIOUS A:   Suspect fungal sinusitis  Staph species 1/2 BC > likely contaminant Doubt HCAP P:   BCx2  1/18 >> few coag neg staph species 1/2  UC 1/19 >> neg BAL 1/20 >> few GPC in clusters AFB (bronch) 1/20 >> neg smear >>  Fungal (bronch) 1/20 >> neg smear >>  Surgical Path (fibrinous mass) 1/20 >> mucin & acute inflammation Culture (finbrinous mass) 1/20 >> few GPC in clusters Fungal (fibrinous mass) 1/20 >> neg smear  If special stains from path consistent with fungal infection then will consult ID, no evidence of this yet  Vanco, start date 1/18, day 5/5 (Stopped) Zosyn, start date 1/18, day 5/5 (Stopped)   FAMILY  - Updates: Updated her daughter at bedside per Dr. Elsworth Soho.  Gina Gens, NP-C [facility] Pgr: [phone] or [phone]  Saw pt with NP - discussed with daughter She needs FU with Dr Myrtie Hawk arranged  Rigoberto Noel MD

## 2014-07-27 NOTE — Evaluation (Addendum)
Occupational Therapy Evaluation Patient Details Name: [name] MRN: [id number] DOB: 1932/09/17 Today's Date: 07/27/2014    History of Present Illness 81 yo female admitted 07/20/14 with worsening SOB/HCAP/MRSA trachea aspirated.   Clinical Impression   Pt up to recliner via stand pivot. Pt fatigues quickly with activity and would benefit from energy conservation education. Pt on face tent and sats were 98% after pivot to chair. Family present. Will follow to progress ADL independence.     Follow Up Recommendations  SNF;Supervision/Assistance - 24 hour    Equipment Recommendations  3 in 1 bedside comode    Recommendations for Other Services       Precautions / Restrictions Precautions Precautions: Fall Precaution Comments: requires oxygen      Mobility Bed Mobility Overal bed mobility: Needs Assistance Bed Mobility: Supine to Sit     Supine to sit: Mod assist;HOB elevated        Transfers Overall transfer level: Needs assistance Equipment used: None Transfers: Sit to/from Stand Sit to Stand: Mod assist Stand pivot transfers: Mod assist       General transfer comment: assist to rise and steady. Pivot to recliner with pt holding to therapists forearms. Stood about 30 seconds for periarea care.    Balance                                            ADL Overall ADL's : Needs assistance/impaired Eating/Feeding: Independent;Sitting   Grooming: Oral care;Set up;Sitting   Upper Body Bathing: Minimal assitance;Sitting (for lines)   Lower Body Bathing: Sit to/from stand;Moderate assistance;+2 for physical assistance Lower Body Bathing Details (indicate cue type and reason): requires bilateral UE support-unable to let go with UEs for periarea care. second person today to wash periareas. Upper Body Dressing : Minimal assistance;Sitting (for lines)   Lower Body Dressing: Sit to/from stand;Moderate assistance;+2 for physical assistance Lower  Body Dressing Details (indicate cue type and reason): second person to manage gown. pt holding to therapist. Toilet Transfer: Moderate assistance;Stand-pivot Toilet Transfer Details (indicate cue type and reason): bear hug step around to chair. Toileting- Clothing Manipulation and Hygiene: Total assistance;Sit to/from stand         General ADL Comments: Pt fatigued after standing for about 30 seconds for periarea care and then stepping around to the chair. Pt able to cross LEs up for donning/doffing socks but fatigues easily with activity. Family present. Pt states she is incontinent of urine and offered family and pt mesh underwear and pad. Informed nursing that when pt up again, she would like to don underwear/pad.      Vision                     Perception     Praxis      Pertinent Vitals/Pain Pain Assessment: No/denies pain     Hand Dominance     Extremity/Trunk Assessment Upper Extremity Assessment Upper Extremity Assessment: Generalized weakness           Communication Communication Communication: HOH   Cognition Arousal/Alertness: Awake/alert Behavior During Therapy: WFL for tasks assessed/performed Overall Cognitive Status: Within Functional Limits for tasks assessed                     General Comments       Exercises       Shoulder Instructions  Home Living Family/patient expects to be discharged to:: Skilled nursing facility [facility] ) Living Arrangements: Spouse/significant other   Type of Home: Apartment Home Access: Level entry     Home Layout: One level     Bathroom Shower/Tub: Occupational psychologist: [place] - single point;Shower seat - built in   Additional Comments: spouse unable to assist      Prior Functioning/Environment Level of Independence: Independent with assistive device(s)        Comments: pt eats most meals in dining room at [facility]    OT  Diagnosis: Generalized weakness   OT Problem List: Decreased strength;Decreased knowledge of use of DME or AE;Decreased activity tolerance   OT Treatment/Interventions: Self-care/ADL training;Patient/family education;Therapeutic activities;DME and/or AE instruction;Energy conservation    OT Goals(Current goals can be found in the care plan section) Acute Rehab OT Goals Patient Stated Goal: pt agreeable to self care tasks. OT Goal Formulation: With patient/family Time For Goal Achievement: 08/10/14 Potential to Achieve Goals: Good  OT Frequency: Min 2X/week   Barriers to D/C:            Co-evaluation              End of Session Equipment Utilized During Treatment: Oxygen  Activity Tolerance: Patient limited by fatigue Patient left: in chair;with call bell/phone within reach;with family/visitor present   Time: 1048-1110 OT Time Calculation (min): 22 min Charges:  OT General Charges $OT Visit: 1 Procedure OT Evaluation $Initial OT Evaluation Tier I: 1 Procedure OT Treatments $Therapeutic Activity: 8-22 mins G-Codes:    Jules Schick  [phone] 07/27/2014, 11:30 AM

## 2014-07-28 LAB — IRON AND TIBC
Iron: 34 ug/dL — ABNORMAL LOW (ref 42–145)
Saturation Ratios: 10 % — ABNORMAL LOW (ref 20–55)
TIBC: 347 ug/dL (ref 250–470)
UIBC: 313 ug/dL (ref 125–400)

## 2014-07-28 LAB — CBC
HCT: 25.2 % — ABNORMAL LOW (ref 36.0–46.0)
Hemoglobin: 7.6 g/dL — ABNORMAL LOW (ref 12.0–15.0)
MCH: 23.3 pg — AB (ref 26.0–34.0)
MCHC: 30.2 g/dL (ref 30.0–36.0)
MCV: 77.3 fL — AB (ref 78.0–100.0)
Platelets: 360 10*3/uL (ref 150–400)
RBC: 3.26 MIL/uL — AB (ref 3.87–5.11)
RDW: 20 % — AB (ref 11.5–15.5)
WBC: 10.6 10*3/uL — ABNORMAL HIGH (ref 4.0–10.5)

## 2014-07-28 LAB — COMPREHENSIVE METABOLIC PANEL
ALT: 39 U/L — ABNORMAL HIGH (ref 0–35)
AST: 23 U/L (ref 0–37)
Albumin: 2.2 g/dL — ABNORMAL LOW (ref 3.5–5.2)
Alkaline Phosphatase: 82 U/L (ref 39–117)
Anion gap: 5 (ref 5–15)
BUN: 23 mg/dL (ref 6–23)
CHLORIDE: 108 mmol/L (ref 96–112)
CO2: 28 mmol/L (ref 19–32)
CREATININE: 0.63 mg/dL (ref 0.50–1.10)
Calcium: 8.3 mg/dL — ABNORMAL LOW (ref 8.4–10.5)
GFR calc non Af Amer: 83 mL/min — ABNORMAL LOW (ref >90)
GLUCOSE: 97 mg/dL (ref 70–99)
Potassium: 3.6 mmol/L (ref 3.5–5.1)
Sodium: 141 mmol/L (ref 135–145)
TOTAL PROTEIN: 4.6 g/dL — AB (ref 6.0–8.3)
Total Bilirubin: 0.6 mg/dL (ref 0.3–1.2)

## 2014-07-28 LAB — FERRITIN: Ferritin: 34 ng/mL (ref 10–291)

## 2014-07-28 LAB — PROTIME-INR
INR: 2.18 — ABNORMAL HIGH (ref 0.00–1.49)
Prothrombin Time: 24.4 seconds — ABNORMAL HIGH (ref 11.6–15.2)

## 2014-07-28 LAB — MAGNESIUM: Magnesium: 2.1 mg/dL (ref 1.5–2.5)

## 2014-07-28 LAB — RETICULOCYTES
RBC.: 3.63 MIL/uL — ABNORMAL LOW (ref 3.87–5.11)
RETIC CT PCT: 3 % (ref 0.4–3.1)
Retic Count, Absolute: 108.9 10*3/uL (ref 19.0–186.0)

## 2014-07-28 LAB — FOLATE: FOLATE: 12.4 ng/mL

## 2014-07-28 LAB — HEMOGLOBIN A1C
HEMOGLOBIN A1C: 5.8 % — AB (ref <5.7)
MEAN PLASMA GLUCOSE: 120 mg/dL — AB (ref <117)

## 2014-07-28 LAB — SAVE SMEAR

## 2014-07-28 LAB — VITAMIN B12: Vitamin B-12: 675 pg/mL (ref 211–911)

## 2014-07-28 MED ORDER — POLYETHYLENE GLYCOL 3350 17 G PO PACK
17.0000 g | PACK | Freq: Every day | ORAL | Status: DC
  Administered 2014-07-28: 17 g via ORAL
  Filled 2014-07-28 (×3): qty 1

## 2014-07-28 MED ORDER — WARFARIN SODIUM 2.5 MG PO TABS
2.5000 mg | ORAL_TABLET | Freq: Once | ORAL | Status: AC
  Administered 2014-07-28: 2.5 mg via ORAL
  Filled 2014-07-28: qty 1

## 2014-07-28 NOTE — Progress Notes (Addendum)
[place] for Warfarin Indication: Hx PE  Allergies  Allergen Reactions  . Adhesive [Tape] Rash    Patient Measurements: Height: 5\' 2"  (157.5 cm) Weight: 144 lb 2.9 oz (65.4 kg) IBW/kg (Calculated) : 50.1  Vital Signs: Temp: 98 F (36.7 C) (01/26 0752) Temp Source: Oral (01/26 0752) BP: 156/78 mmHg (01/26 0752) Pulse Rate: 76 (01/26 0752)  Labs:  Recent Labs  07/26/14 0530 07/27/14 0530 07/28/14 0545  HGB 7.7* 7.9* 7.6*  HCT 25.3* 25.9* 25.2*  PLT 383 351 360  LABPROT 30.3* 29.3* 24.4*  INR 2.87* 2.75* 2.18*  CREATININE 0.61 0.68 0.63    Estimated Creatinine Clearance: 50.6 mL/min (by C-G formula based on Cr of 0.63).  Assessment: 23 yoF [facility] with SOB, fever, and worsening productive cough x 1 month. Note PMHx of PE on chronic warfarin anticoagulation.  Pharmacy consulted to resume warfarin inpatient.   Home dose warfarin: 2.5mg  daily except 3.75mg  on M/W/F, LD 1/17  Significant events 1/20 warfarin dose not given (pt was intubated and NPO, but did have OG tube access.)  1/24 LUE venous duplex w/ superficial thrombus in left cephalic vein.  Negative for DVT.  Today, 07/28/2014  INR 2.18, remains therapeutic  CBC: Hgb remians low, 7.6, Plt remain WNL.      FOBT negative 1/19   No bleeding or complications reported  Diet: Regular  Drug interactions: abx/steroids may reduce warfarin needs  Goal of Therapy:  INR 2-3 Monitor platelets by anticoagulation protocol: Yes   Plan:   Warfarin 2.5 mg PO x1 tonight at 1800  Daily PT/INR, CBC  Monitor for bleeding  Gretta Arab PharmD, BCPS Pager [phone] 07/28/2014 8:47 AM   Addendum: For discharge, recommend resuming warfarin 2.5mg  daily with INR recheck on Friday, 1/29.  Gretta Arab PharmD, BCPS Pager [phone] 07/28/2014 11:46 AM

## 2014-07-28 NOTE — Progress Notes (Addendum)
[facility] PROGRESS NOTE  [name]:[id number] DOB: 07/29/1932 DOA: 07/20/2014 PCP:  [name], MD  Assessment/Plan: Principal Problem:   HCAP (healthcare-associated pneumonia) Active Problems:   Dyspnea   Protein-calorie malnutrition, severe   Acute respiratory failure with hypoxia   Shortness of breath    Acute Respiratory Failure with Hypoxemia > slow to improve, status post extubation 1/22 Respiratory failure secondary to  fibrinous mass (fungal?) resolved Stridor> still occuring 1/23 am, seems to be associated with secretion management and also anxiety. 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 He recommends to maximize humidity, Face shield with humidified oxygen Continue Xopenex Q8 + Q6 PRN  Continue Dulera , continue raceepinephrine Fungal culture, AFB, negative bronchoalveolar lavage culture shows MRSA Restarted vancomycin 1/23, infectious disease consultation obtained and they recommend to stop all antibiotics, Dr. Megan Salon recommends Discontinue vancomycin and observe off of antibiotics Assessment form oxygen needs, will ambulate patient on room air and 2 L to document need   Sinus tachycardia resolved Continue gentle hydration   GI prophylaxis-switch Protonix to by mouth    Normocytic Anemia ,Hx PE on chronic anticoagulation FOBT negative Will request hematology consultation, patient used to see Dr. Collier [name] 5 years agonever Continue coumadin per pharmaHemoglobin stable, FOBT negative 1 Not interested in receiving a transfusion today Check  anemia panel   spect fungal sinusitis  Staph species 1/2 BC > likely contaminant Doubt HCAP BCx2 1/18 >> few coag neg staph species 1/2  UC 1/19 >> neg BAL 1/20 >> few GPC in clusters, MRSA AFB (bronch) 1/20 >>  Fungal (bronch) 1/20 >>  Cytology (fibrinous mass) 1/20 >>  Culture (finbrinous mass) 1/20 >> MRSA Fungal (fibrinous mass) 1/20 >>  Vanco, start date 1/18, day 5/5 (Stopped) Zosyn, start date 1/18, day 5/5 (Stopped) Vancomycin restarted 1/23-1/25 no further antibiotics indicated X  Steroid-induced hyperglycemia SSI, sensitive scale with steroids  will check hemoglobin A1c   Generalized weakness Start ambulating patient with physical therapy once FiO2 is tapered down to maintain saturation greater than Discharge to friend's home tomorrow No bed available today   Code Status: full Family Communication: family updated about patient's clinical progress Disposition Plan:  Continue telemetry, SNF probably ready tomorrow    Brief narrative: 81 year old female from SNF, history of Wegener's granulomatosis followed by [name], asthma who presents to the ER because of worsening shortness of breath, fever 103. The patient has not been feeling well for a couple of months gradually worsening over the last 3-4 weeks. The patient states that 2 months ago she was diagnosed with anemia, hemoglobin significantly lower than baseline. She did not have any extensive workup other than been provided with a referral to see [name]. This consult has not taken place as of yet. She denies any hematochezia, melena. She describes 15 pound weight loss in the last 2-3 years. She subsequently fell ill Christmas Eve, so her PCP office and he placed her on levofloxacin for 7 days for suspected pneumonia. Patient was subsequently seen at urgent care on 1/8 for weakness and persistent cough rx sx . In the ED the patient was found to have a normal white  count of 5.4. Chest x-ray shows  emphysema with increased interstitial markings  Consultants:  Pulmonary critical care  ENT  STUDIES:  1/19 CT Chest >> thin membranous appearing structure in proximal trachea beneath the vocal cords of uncertain etiology, mild CHF, multiple nodules throughout lungs bilaterally L>R, no new large cavitary lesions  SIGNIFICANT EVENTS: 1/18 Admit with weight loss, fever 103, weakness & persistent cough 1/19 PCCM consulted for evaluation  1/20 Decompensated with stridor, to ICU for emergent intubation > large black fibrinous mass removed from trachea 1/22 Extubated   Antibiotics: Vanco, start date 1/18, day 5/5 (Stopped) Zosyn, start date 1/18, day 5/5 (Stopped) Vancomycin restarted 1/23-1/25  HPI/Subjective: No respiratory distress, constipated, denies any chest pain, complaining of generalized weakness   Objective: Filed Vitals:   07/27/14 1424 07/27/14 2230 07/28/14 0752 07/28/14 0837  BP:  144/68 156/78   Pulse:  75 76   Temp:  98.1 F (36.7 C) 98 F (36.7 C)   TempSrc:  Oral Oral   Resp:  18 18   Height:      Weight:      SpO2: 99% 100% 100% 97%   No intake or output data in the 24 hours ending 07/28/14 1020  Exam:  General: alert & oriented x 3, weak cough, patient on the face tent Cardiovascular: RRR, nl S1 s2  Respiratory: Decreased breath sounds at the bases, scattered rhonchi, no crackles  Abdomen: soft +BS NT/ND, no masses palpable  Extremities: No cyanosis and no edema      Data Reviewed: Basic Metabolic Panel:  Recent Labs Lab 07/22/14 0700 07/23/14 0400 07/24/14 0420 07/26/14 0530 07/27/14 0530 07/28/14 0545  NA 138 141 142 140  --  141  K 4.0 4.0 4.4 4.3  --  3.6  CL 108 113* 114* 112  --  108  CO2 24 24 23 24   --  28  GLUCOSE 178* 140* 159* 146*  --  97  BUN 12 19 29* 28*  --  23  CREATININE 0.66 0.70 0.82 0.61 0.68 0.63  CALCIUM 8.9 8.5 9.0 8.5  --  8.3*  MG  --   --   --   --   --  2.1    Liver Function Tests:  Recent  Labs Lab 07/22/14 0700 07/23/14 0400 07/26/14 0530 07/28/14 0545  AST 23 36 36 23  ALT 14 23 43* 39*  ALKPHOS 97 97 90 82  BILITOT 0.6 0.4 0.5 0.6  PROT 5.9* 5.0* 5.0* 4.6*  ALBUMIN 2.7* 2.3* 2.3* 2.2*   No results for input(s): LIPASE, AMYLASE in the last 168 hours. No results for input(s): AMMONIA in the last 168 hours.  CBC:  Recent Labs Lab 07/24/14 0420 07/25/14 1038 07/26/14 0530 07/27/14 0530 07/28/14 0545  WBC 8.8 8.2 6.6 10.1 10.6*  NEUTROABS 8.1*  --   --   --   --   HGB 8.1* 7.7* 7.7* 7.9* 7.6*  HCT 26.8* 25.2* 25.3* 25.9* 25.2*  MCV 80.0 79.0 79.1 78.2 77.3*  PLT 421* 389 383 351 360    Cardiac Enzymes: No results for input(s): CKTOTAL, CKMB, CKMBINDEX, TROPONINI in the last 168 hours. BNP (last 3 results) No results for input(s): PROBNP in the last 8760 hours.   CBG:  Recent Labs Lab 07/25/14 2036 07/26/14 0052 07/26/14 0521 07/26/14 0825 07/26/14 1218  GLUCAP 172* 128* 137* 145* 144*    Recent Results (from the past 240 hour(s))  Culture, blood (routine x 2)  Status: None   Collection Time: 07/20/14  1:20 PM  Result Value Ref Range Status   Specimen Description BLOOD RIGHT ANTECUBITAL  Final   Special Requests BOTTLES DRAWN AEROBIC AND ANAEROBIC 3ML  Final   Culture   Final    STAPHYLOCOCCUS SPECIES (COAGULASE NEGATIVE) Note: THE SIGNIFICANCE OF ISOLATING THIS ORGANISM FROM A SINGLE SET OF BLOOD CULTURES WHEN MULTIPLE SETS ARE DRAWN IS UNCERTAIN. PLEASE NOTIFY THE MICROBIOLOGY DEPARTMENT WITHIN ONE WEEK IF SPECIATION AND SENSITIVITIES ARE REQUIRED. Note: Gram Stain Report Called to,Read Back By and Verified With: JENNIFER HUFF 07/22/14 AT 1150 AM BY [facility] Performed at [facility]    Report Status 07/23/2014 FINAL  Final  Culture, blood (routine x 2)     Status: None   Collection Time: 07/20/14  1:22 PM  Result Value Ref Range Status   Specimen Description BLOOD RIGHT FOREARM  Final   Special Requests BOTTLES DRAWN AEROBIC  AND ANAEROBIC 3ML  Final   Culture   Final    NO GROWTH 5 DAYS Performed at [facility]    Report Status 07/26/2014 FINAL  Final  Urine culture     Status: None   Collection Time: 07/20/14  1:40 PM  Result Value Ref Range Status   Specimen Description URINE, CLEAN CATCH  Final   Special Requests NONE  Final   Colony Count NO GROWTH Performed at [facility]   Final   Culture NO GROWTH Performed at [facility]   Final   Report Status 07/21/2014 FINAL  Final  Culture, respiratory (NON-Expectorated)     Status: None   Collection Time: 07/22/14 12:56 PM  Result Value Ref Range Status   Specimen Description TRACHEAL ASPIRATE  Final   Special Requests NONE  Final   Gram Stain   Final    RARE WBC PRESENT, PREDOMINANTLY MONONUCLEAR RARE SQUAMOUS EPITHELIAL CELLS PRESENT FEW GRAM POSITIVE COCCI IN PAIRS IN CLUSTERS Performed at [facility]    Culture   Final    ABUNDANT METHICILLIN RESISTANT STAPHYLOCOCCUS AUREUS Note: RIFAMPIN AND GENTAMICIN SHOULD NOT BE USED AS SINGLE DRUGS FOR TREATMENT OF STAPH INFECTIONS. CRITICAL RESULT CALLED TO, READ BACK BY AND VERIFIED WITH: Clent Ridges RN [id number] 07/25/14 GUS Performed at [facility]    Report Status 07/25/2014 FINAL  Final   Organism ID, Bacteria METHICILLIN RESISTANT STAPHYLOCOCCUS AUREUS  Final      Susceptibility   Methicillin resistant staphylococcus aureus - MIC*    CLINDAMYCIN >=8 RESISTANT Resistant     ERYTHROMYCIN >=8 RESISTANT Resistant     GENTAMICIN <=0.5 SENSITIVE Sensitive     LEVOFLOXACIN >=8 RESISTANT Resistant     OXACILLIN >=4 RESISTANT Resistant     PENICILLIN >=0.5 RESISTANT Resistant     RIFAMPIN <=0.5 SENSITIVE Sensitive     TRIMETH/SULFA >=320 RESISTANT Resistant     VANCOMYCIN 2 SENSITIVE Sensitive     TETRACYCLINE >=16 RESISTANT Resistant     * ABUNDANT METHICILLIN RESISTANT STAPHYLOCOCCUS AUREUS  Fungus Culture with Smear     Status: None (Preliminary result)    Collection Time: 07/22/14  1:05 PM  Result Value Ref Range Status   Specimen Description LUNG  Final   Special Requests NONE  Final   Fungal Smear   Final    NO YEAST OR FUNGAL ELEMENTS SEEN Performed at [facility]    Culture   Final    CULTURE IN PROGRESS FOR FOUR WEEKS Performed at [facility]  Report Status PENDING  Incomplete  MRSA PCR Screening     Status: None   Collection Time: 07/22/14  1:07 PM  Result Value Ref Range Status   MRSA by PCR NEGATIVE NEGATIVE Final    Comment:        The GeneXpert MRSA Assay (FDA approved for NASAL specimens only), is one component of a comprehensive MRSA colonization surveillance program. It is not intended to diagnose MRSA infection nor to guide or monitor treatment for MRSA infections.   Culture, respiratory (NON-Expectorated)     Status: None   Collection Time: 07/22/14  1:25 PM  Result Value Ref Range Status   Specimen Description BRONCHIAL ALVEOLAR LAVAGE  Final   Special Requests Immunocompromised  Final   Gram Stain   Final    FEW WBC PRESENT, PREDOMINANTLY MONONUCLEAR RARE SQUAMOUS EPITHELIAL CELLS PRESENT FEW GRAM POSITIVE COCCI IN PAIRS IN CLUSTERS Performed at [facility]    Culture   Final    FEW METHICILLIN RESISTANT STAPHYLOCOCCUS AUREUS Note: RIFAMPIN AND GENTAMICIN SHOULD NOT BE USED AS SINGLE DRUGS FOR TREATMENT OF STAPH INFECTIONS. CRITICAL RESULT CALLED TO, READ BACK BY AND VERIFIED WITH: Clent Ridges RN [id number] 07/25/14 GUSTK Performed at [facility]    Report Status 07/25/2014 FINAL  Final   Organism ID, Bacteria METHICILLIN RESISTANT STAPHYLOCOCCUS AUREUS  Final      Susceptibility   Methicillin resistant staphylococcus aureus - MIC*    CLINDAMYCIN >=8 RESISTANT Resistant     ERYTHROMYCIN >=8 RESISTANT Resistant     GENTAMICIN <=0.5 SENSITIVE Sensitive     LEVOFLOXACIN >=8 RESISTANT Resistant     OXACILLIN >=4 RESISTANT Resistant     PENICILLIN >=0.5 RESISTANT Resistant      RIFAMPIN <=0.5 SENSITIVE Sensitive     TRIMETH/SULFA >=320 RESISTANT Resistant     VANCOMYCIN 2 SENSITIVE Sensitive     TETRACYCLINE >=16 RESISTANT Resistant     * FEW METHICILLIN RESISTANT STAPHYLOCOCCUS AUREUS  Fungus Culture with Smear     Status: None (Preliminary result)   Collection Time: 07/22/14  1:27 PM  Result Value Ref Range Status   Specimen Description BRONCHIAL ALVEOLAR LAVAGE  Final   Special Requests Immunocompromised  Final   Fungal Smear   Final    NO YEAST OR FUNGAL ELEMENTS SEEN Performed at [facility]    Culture   Final    CULTURE IN PROGRESS FOR FOUR WEEKS Performed at [facility]    Report Status PENDING  Incomplete  AFB culture with smear     Status: None (Preliminary result)   Collection Time: 07/22/14  1:27 PM  Result Value Ref Range Status   Specimen Description BRONCHIAL ALVEOLAR LAVAGE  Final   Special Requests Immunocompromised  Final   Acid Fast Smear   Final    NO ACID FAST BACILLI SEEN Performed at [facility]    Culture   Final    CULTURE WILL BE EXAMINED FOR 6 WEEKS BEFORE ISSUING A FINAL REPORT Performed at [facility]    Report Status PENDING  Incomplete     Studies: Dg Chest 2 View (if Patient Has Fever And/or Copd)  07/20/2014   CLINICAL DATA:  Cough and shortness of breath for 1 month.  EXAM: CHEST  2 VIEW  COMPARISON:  Chest x-rays dated 07/10/2014 and 07/08/2014 and 06/25/2014 and chest CT dated 09/24/2013  FINDINGS: The heart size and pulmonary vascularity are normal. There are multiple areas of scarring in both lungs, particularly in  the right upper lobe and left lung base. There is a small area of scarring laterally at the left lung base as well as posterior medially. There is a tiny right pleural effusion, unchanged since 06/25/2014 but new since 09/24/2013.  The markings are slightly more accentuated at the bases than on the prior study.  No acute osseous abnormalities. There are emphysematous  changes bilaterally.  IMPRESSION: Emphysema with scarring in both lungs. Slight increased accentuation of the interstitial markings could represent superimposed bronchitis. Persistent small right pleural effusion.   Electronically Signed   By: Rozetta Nunnery M.D.   On: 07/20/2014 14:17   Dg Chest 2 View  07/10/2014   CLINICAL DATA:  Cough and congestion .  EXAM: CHEST  2 VIEW  COMPARISON:  07/08/2014.  06/25/2014.  FINDINGS: Mediastinum and hilar structures normal. Stable diffuse bilateral pulmonary interstitial mild prominence noted suggesting chronic interstitial lung disease. Pleural parenchymal thickening consistent with scarring. COPD. Heart size is stable. No pulmonary venous congestion. Scoliosis thoracic spine with diffuse degenerative change.  IMPRESSION: Stable mild interstitial prominence consistent with chronic interstitial lung disease. Pleural parenchymal scarring. COPD. No acute cardiopulmonary disease.   Electronically Signed   By: Marcello Moores  Register   On: 07/10/2014 12:32   Ct Chest W Contrast  07/21/2014   CLINICAL DATA:  81 year old female with history of small vessel vasculitis (granulomatous polyangitis) with history of airway stenosis, presenting with loud wheeze and scant hemoptysis. Evaluate for potential parenchymal involvement of the lungs versus airway stenosis. Shortness of breath, wheezing and fever for 1 month.  EXAM: CT CHEST WITH CONTRAST  TECHNIQUE: Multidetector CT imaging of the chest was performed during intravenous contrast administration.  CONTRAST:  61mL OMNIPAQUE IOHEXOL 300 MG/ML  SOLN  COMPARISON:  Chest CT 09/24/2013.  FINDINGS: Comment: Examination is limited by considerable patient respiratory motion.  Mediastinum/Lymph Nodes: Heart size is mildly enlarged. There is no significant pericardial fluid, thickening or pericardial calcification. Calcifications of the mitral annulus. No pathologically enlarged mediastinal or hilar lymph nodes. Esophagus is unremarkable in  appearance.  Lungs/Pleura: In the proximal trachea shortly beneath the level of the vocal cords There is some thin irregular-shaped soft tissue which has the appearance of a small membrane, which appears intimately associated with the wall of the trachea. The trachea and mainstem bronchi are otherwise widely patent. Again noted are nodular appearing areas of architectural distortion in the lungs bilaterally, most notably in the left lower lobe where there is a 1.8 x 1.3 cm spiculated appearing nodular density in the superior segment of the left lower lobe, and a 2.4 x 1.5 cm spiculated appearing nodular density in the posterior aspect of the left lower lobe. Multiple other smaller nodules are noted. These two largest lesions appear more prominent than the prior study from 09/24/2013, however, when compared to more remote prior studies such that is 01/03/2011, both of these lesions appear slightly smaller, and are presumably areas of chronic postinflammatory scarring given the patient's history of vasculitis. There is a background of mild diffuse ground-glass attenuation with mild interlobular septal thickening, suggesting a background of mild interstitial pulmonary edema. Moderate bilateral pleural effusions are noted. Large bulla in the apex of the left hemithorax is unchanged.  Musculoskeletal/Soft Tissues: There are no aggressive appearing lytic or blastic lesions noted in the visualized portions of the skeleton.  Upper Abdomen: Unremarkable.  IMPRESSION: 1. There is a thin membranous appearing structure in the proximal trachea immediately beneath the level the vocal cords, which is of uncertain etiology  and significance, but may account for the patient's upper airway wheezing on physical examination. Consideration for direct inspection with bronchoscopy is recommended if clinically appropriate. 2. The appearance of the chest suggests mild congestive heart failure, as above. 3. Multiple nodules throughout the  lungs bilaterally (left greater than right), similar in appearance to numerous prior examinations, presumably sequela of underlying Wegner's granulomatosis. No new large cavitary lesion to suggest active parenchymal involvement at this time. 4. Mild cardiomegaly.   Electronically Signed   By: Vinnie Langton M.D.   On: 07/21/2014 13:01   Ct Maxillofacial W/cm  07/22/2014   CLINICAL DATA:  Wegner's granulomatosis. Question fungal sinusitis as cause of airway debris.  EXAM: CT MAXILLOFACIAL WITH CONTRAST  TECHNIQUE: Multidetector CT imaging of the maxillofacial structures was performed with intravenous contrast. Multiplanar CT image reconstructions were also generated. A small metallic BB was placed on the right temple in order to reliably differentiate right from left.  CONTRAST:  148mL OMNIPAQUE IOHEXOL 300 MG/ML  SOLN  COMPARISON:  05/08/2008  FINDINGS: Chronic pansinusitis status post medial maxillary antrostomies, bilateral turbinectomies, and ethmoidectomies. The right sphenoid sinus has also been opened. Mucosal thickening is diffuse throughout the sinuses and there are small layering effusions within the maxillary and right sphenoid sinuses. The surgical openings are patent, although the right sphenoidotomy is moderately narrowed by mucosal disease. There is a small perforation in the anterior lower nasal septum. These changes are all consistent with history of Wegner's granulomatosis. Concerning the clinical question of fungal sinusitis, there certainly could be fungal colonization (some of the sinus material is high-density), but no fungal mass or invasive sinusitis is detected to explain the obstructive airway mass on bronchoscopy.  Complete opacification of the bilateral mastoid and middle ear cavities. No wrist changes.  Bilateral cataract resection.  Otherwise, the orbits are negative.  Limited evaluation of the oral cavity, oropharynx, and hypopharynx due to intubation with retained fluid.   IMPRESSION: 1. Wegner's granulomatosis with pansinusitis and otomastoiditis described above. 2. Concerning the clinical question, there may be sinus fungal colonization but there is no visible mycetoma or invasive sinusitis.   Electronically Signed   By: Jorje Guild M.D.   On: 07/22/2014 22:34   Dg Chest Port 1 View  07/24/2014   CLINICAL DATA:  Acute respiratory failure.  Hypoxia.  EXAM: PORTABLE CHEST - 1 VIEW  COMPARISON:  07/23/2014  FINDINGS: Left PICC line tip: Lower SVC. Mild enlargement of the cardiopericardial silhouette diffuse interstitial accentuation. Suspected layering left pleural effusion. Left mid lung hazy opacity mildly improved.  Atherosclerotic calcification of the thoracic aorta. Probable small right pleural effusion. Obscuration of the left hemidiaphragm, probably due to the pleural effusion and passive atelectasis.  IMPRESSION: 1. The dominant changes reduced hazy density in the left mid lung probably due to redistribution of left pleural effusion. There may be some slight improvement in the underlying interstitial accentuation which is probably a manifestation of pulmonary edema, less likely from atypical infectious process.   Electronically Signed   By: Sherryl Barters M.D.   On: 07/24/2014 10:13   Dg Chest Port 1 View  07/23/2014   CLINICAL DATA:  Acute respiratory failure  EXAM: PORTABLE CHEST - 1 VIEW  COMPARISON:  07/22/2014  FINDINGS: Cardiac shadow is stable. An endotracheal tube, left-sided PICC line and nasogastric catheter are stable in appearance. Increasing density is noted in the left retrocardiac region consistent with atelectasis. Diffuse vascular congestion is again identified.  IMPRESSION: Increasing left lower lobe atelectasis. The remainder  of the exam is stable.   Electronically Signed   By: Inez Catalina M.D.   On: 07/23/2014 07:13   Dg Chest Port 1 View  07/22/2014   CLINICAL DATA:  Respiratory failure.  Endotracheal tube placement.  EXAM: PORTABLE CHEST -  1 VIEW  COMPARISON:  07/20/2014  FINDINGS: The endotracheal to is 3.5 cm above the carina. The NG tube is in the stomach. The cardiac silhouette, mediastinal and hilar contours are stable. Moderate pulmonary edema. Small pleural effusions.  IMPRESSION: The endotracheal tube and NG tubes are in good position.  Moderate pulmonary edema.   Electronically Signed   By: Kalman Jewels M.D.   On: 07/22/2014 14:21   Dg Chest Port 1 View  07/21/2014   CLINICAL DATA:  F/u SOB, droplet precautions, hx. Asthma, fever, cough and congestion, chest tightness and wheezing  EXAM: PORTABLE CHEST - 1 VIEW  COMPARISON:  the previous day's study  FINDINGS: Increase in diffuse interstitial infiltrates or edema, with peripheral septal lines and some patchy new airspace opacities in bilateral lung bases. Heart size upper limits normal for technique. No effusion. Mild spondylitic changes in the mid thoracic spine.  IMPRESSION: 1. Progressive bilateral edema/infiltrates as above.   Electronically Signed   By: Arne Cleveland M.D.   On: 07/21/2014 11:02    Scheduled Meds: . antiseptic oral rinse  7 mL Mouth Rinse q12n4p  . chlorhexidine  15 mL Mouth Rinse BID  . latanoprost  1 drop Both Eyes QHS  . levalbuterol  1.25 mg Nebulization 3 times per day  . mometasone-formoterol  2 puff Inhalation BID  . pantoprazole  40 mg Oral BID  . predniSONE  40 mg Oral Q breakfast  . sodium chloride  1 spray Each Nare BID  . sodium chloride  10-40 mL Intracatheter Q12H  . sodium chloride  3 mL Intravenous Q12H  . timolol  1 drop Left Eye Q breakfast  . warfarin  2.5 mg Oral ONCE-1800  . Warfarin - Pharmacist Dosing Inpatient   Does not apply q1800  . zolpidem  5 mg Oral QHS   Continuous Infusions:    Principal Problem:   HCAP (healthcare-associated pneumonia) Active Problems:   Dyspnea   Protein-calorie malnutrition, severe   Acute respiratory failure with hypoxia   Shortness of breath    Time spent: 40  minutes   [facility] Pager [phone]. If 7PM-7AM, please contact night-coverage at [email or web address], password [facility] 07/28/2014, 10:20 AM  LOS: 8 days

## 2014-07-28 NOTE — Consult Note (Addendum)
CHERESE LOZANO   DOB:12-28-1932   AC#:[id number]   [facility]#:[id number]  Patient Care Team: [name], MD as PCP - General (Family Medicine)   HPI: Ms. Quin is a 81 year old woman with a history of normocytic anemia initially followed by Dr Marin Olp, not seen since January 2012,  and history of PE in 1997 on chronic anticoagulation with Coumadin, admitted with acute respiratory failure with hypoxemia requiring intubation on 07/20/14, following recent pneumonia. Per report, she had been experiencing progressive malaise over the last 2 months, with decreased appetite She has also lost 15 lb over the last 2 years. On admission she had a fever up to 103, now resolved.  Denies chills, night sweats, vision changes, or mucositis. She has intermittent shortness of breath. Denies any chest pain or palpitations. Denies lower extremity swelling. Denies nausea, heartburn or change in bowel habits. Denies any dysuria. Denies abnormal skin rashes, or neuropathy. Denies any bleeding issues such as epistaxis, hematemesis, hematuria or hematochezia. Ambulating without difficulty. She takes Reclast and Methotrexate. CT maxillofacial shows pansinusitis and otomastoiditis, no invasive mycetoma or invasive sinusitis. Fungal Cultures and AFB negative but  Bronchoalveolar lavage culture shows MRSA. Galactomannan Ag negative on 1/18. She was placed on Vanco until 1/26  and Zosyn until 1/22.  Hb on admission was 8.3, then dropping to a range between 7.4-8.1 MCV is 77.3  No transfusion was received during this admission. Last colonoscopy was on 02/11/11, essentially unremarkable.  CBC  Today shows a Hb of 7.6 with normal platelets. Her WBC is over 10 but she is on steroids. Hemoccults is negative. Anemia panel is pending.    Past Medical History  Diagnosis Date  . Wegener's granulomatosis   . OA (osteoarthritis)   . Blindness of one eye   . Hearing loss in left ear     hearing aid both ears  . Bladder incontinence   . Glaucoma   .  Clotting disorder   . History of pulmonary embolism 1997  . Shortness of breath dyspnea   . Pneumonia 12/15  . Multiple allergies   . Chronic sinusitis    Scheduled Meds: . antiseptic oral rinse  7 mL Mouth Rinse q12n4p  . chlorhexidine  15 mL Mouth Rinse BID  . latanoprost  1 drop Both Eyes QHS  . levalbuterol  1.25 mg Nebulization 3 times per day  . mometasone-formoterol  2 puff Inhalation BID  . pantoprazole  40 mg Oral BID  . polyethylene glycol  17 g Oral Daily  . predniSONE  40 mg Oral Q breakfast  . sodium chloride  1 spray Each Nare BID  . sodium chloride  10-40 mL Intracatheter Q12H  . sodium chloride  3 mL Intravenous Q12H  . timolol  1 drop Left Eye Q breakfast  . warfarin  2.5 mg Oral ONCE-1800  . Warfarin - Pharmacist Dosing Inpatient   Does not apply q1800  . zolpidem  5 mg Oral QHS   Continuous Infusions:  PRN Meds:acetaminophen **OR** acetaminophen, fentaNYL, levalbuterol, lip balm, LORazepam, ondansetron **OR** ondansetron (ZOFRAN) IV, sodium chloride, vitamin A & D  History   Social History  . Marital Status: Married    Spouse Name: N/A    Number of Children: N/A  . Years of Education: N/A   Occupational History  . retired    Social History Main Topics  . Smoking status: Former Smoker -- 1.50 packs/day for 30 years    Types: Cigarettes    Quit date: 07/03/1984  .  Smokeless tobacco: None  . Alcohol Use: Yes     Comment: 1-2 glasses  . Drug Use: No  . Sexual Activity: None   Other Topics Concern  . None   Social History Narrative   Family History  Problem Relation Age of Onset  . Heart disease Father     stroke-deceased  . Stroke Father   . Congestive Heart Failure Mother     deceased  . Heart disease Mother   . Uterine cancer Sister   . Lymphoma Sister   . Skin cancer Sister     no melanoma   Objective:  Filed Vitals:   07/28/14 0752  BP: 156/78  Pulse: 76  Temp: 98 F (36.7 C)  Resp: 18     No intake or output data in the 24  hours ending 07/28/14 1125   GENERAL:alert, no distress and comfortable SKIN: skin color, texture, turgor are normal, no rashes or significant lesions EYES: normal, conjunctiva are pink and non-injected, sclera clear OROPHARYNX:no exudate, no erythema and lips, buccal mucosa, and tongue normal  NECK: supple, thyroid normal size, non-tender, without nodularity LYMPH:  no palpable lymphadenopathy in the cervical, axillary or inguinal LUNGS:decreased breath sounds at the bases, scattered rhonchi, no rales.  HEART: regular rate & rhythm and no murmurs and no lower extremity edema ABDOMEN:abdomen soft, non-tender and normal bowel sounds Musculoskeletal:no cyanosis of digits and no clubbing  PSYCH: alert & oriented x 3 with fluent speech NEURO: no focal motor/sensory deficits    CBG (last 3)   Recent Labs  07/26/14 0521 07/26/14 0825 07/26/14 1218  GLUCAP 137* 145* 144*     Labs:   Recent Labs Lab 07/24/14 0420 07/25/14 1038 07/26/14 0530 07/27/14 0530 07/28/14 0545  WBC 8.8 8.2 6.6 10.1 10.6*  HGB 8.1* 7.7* 7.7* 7.9* 7.6*  HCT 26.8* 25.2* 25.3* 25.9* 25.2*  PLT 421* 389 383 351 360  MCV 80.0 79.0 79.1 78.2 77.3*  MCH 24.2* 24.1* 24.1* 23.9* 23.3*  MCHC 30.2 30.6 30.4 30.5 30.2  RDW 20.4* 20.0* 20.3* 19.9* 20.0*  LYMPHSABS 0.4*  --   --   --   --   MONOABS 0.3  --   --   --   --   EOSABS 0.0  --   --   --   --   BASOSABS 0.0  --   --   --   --      Chemistries:    Recent Labs Lab 07/22/14 0700 07/23/14 0400 07/24/14 0420 07/26/14 0530 07/27/14 0530 07/28/14 0545  NA 138 141 142 140  --  141  K 4.0 4.0 4.4 4.3  --  3.6  CL 108 113* 114* 112  --  108  CO2 24 24 23 24   --  28  GLUCOSE 178* 140* 159* 146*  --  97  BUN 12 19 29* 28*  --  23  CREATININE 0.66 0.70 0.82 0.61 0.68 0.63  CALCIUM 8.9 8.5 9.0 8.5  --  8.3*  MG  --   --   --   --   --  2.1  AST 23 36  --  36  --  23  ALT 14 23  --  43*  --  39*  ALKPHOS 97 97  --  90  --  82  BILITOT 0.6 0.4   --  0.5  --  0.6    GFR Estimated Creatinine Clearance: 50.6 mL/min (by C-G formula based on Cr of 0.63).  Liver Function Tests:  Recent Labs Lab 07/22/14 0700 07/23/14 0400 07/26/14 0530 07/28/14 0545  AST 23 36 36 23  ALT 14 23 43* 39*  ALKPHOS 97 97 90 82  BILITOT 0.6 0.4 0.5 0.6  PROT 5.9* 5.0* 5.0* 4.6*  ALBUMIN 2.7* 2.3* 2.3* 2.2*   No results for input(s): LIPASE, AMYLASE in the last 168 hours. No results for input(s): AMMONIA in the last 168 hours.  Urine Studies     Component Value Date/Time   COLORURINE YELLOW 07/20/2014 1340   APPEARANCEUR CLEAR 07/20/2014 1340   LABSPEC 1.012 07/20/2014 1340   PHURINE 6.0 07/20/2014 1340   GLUCOSEU NEGATIVE 07/20/2014 1340   HGBUR NEGATIVE 07/20/2014 1340   BILIRUBINUR NEGATIVE 07/20/2014 1340   KETONESUR NEGATIVE 07/20/2014 1340   PROTEINUR NEGATIVE 07/20/2014 1340   UROBILINOGEN 0.2 07/20/2014 1340   NITRITE NEGATIVE 07/20/2014 1340   LEUKOCYTESUR NEGATIVE 07/20/2014 1340    Coagulation profile  Recent Labs Lab 07/24/14 0420 07/25/14 0530 07/26/14 0530 07/27/14 0530 07/28/14 0545  INR 2.56* 2.60* 2.87* 2.75* 2.18*    Cardiac Enzymes: No results for input(s): CKTOTAL, CKMB, CKMBINDEX, TROPONINI in the last 168 hours. BNP: Invalid input(s): POCBNP CBG:  Recent Labs Lab 07/25/14 2036 07/26/14 0052 07/26/14 0521 07/26/14 0825 07/26/14 1218  GLUCAP 172* 128* 137* 145* 144*    Fungal Cultures and AFB negative but  Bronchoalveolar lavage culture shows MRSA galactomannan Ag negative on 1/18 Granulomatosis polyangitis noted  but no clear evidence of active lung involvement  Per CT chest 1/21 Urine culture negative   Imaging Studies:  CLINICAL DATA: 81 year old female with history of small vessel vasculitis (granulomatous polyangitis) with history of airway stenosis, presenting with loud wheeze and scant hemoptysis. Evaluate for potential parenchymal involvement of the lungs versus airway stenosis.  Shortness of breath, wheezing and fever for 1 month.  EXAM: CT CHEST WITH CONTRAST   COMPARISON: Chest CT 09/24/2013.  FINDINGS: Comment: Examination is limited by considerable patient respiratory motion.  Mediastinum/Lymph Nodes: Heart size is mildly enlarged. There is no significant pericardial fluid, thickening or pericardial calcification. Calcifications of the mitral annulus. No pathologically enlarged mediastinal or hilar lymph nodes. Esophagus is unremarkable in appearance.  Lungs/Pleura: In the proximal trachea shortly beneath the level of the vocal cords There is some thin irregular-shaped soft tissue which has the appearance of a small membrane, which appears intimately associated with the wall of the trachea. The trachea and mainstem bronchi are otherwise widely patent. Again noted are nodular appearing areas of architectural distortion in the lungs bilaterally, most notably in the left lower lobe where there is a 1.8 x 1.3 cm spiculated appearing nodular density in the superior segment of the left lower lobe, and a 2.4 x 1.5 cm spiculated appearing nodular density in the posterior aspect of the left lower lobe. Multiple other smaller nodules are noted. These two largest lesions appear more prominent than the prior study from 09/24/2013, however, when compared to more remote prior studies such that is 01/03/2011, both of these lesions appear slightly smaller, and are presumably areas of chronic postinflammatory scarring given the patient's history of vasculitis. There is a background of mild diffuse ground-glass attenuation with mild interlobular septal thickening, suggesting a background of mild interstitial pulmonary edema. Moderate bilateral pleural effusions are noted. Large bulla in the apex of the left hemithorax is unchanged.  Musculoskeletal/Soft Tissues: There are no aggressive appearing lytic or blastic lesions noted in the visualized portions of the  skeleton.  Upper Abdomen: Unremarkable.  IMPRESSION: 1. There is a thin membranous appearing structure in the proximal trachea immediately beneath the level the vocal cords, which is of uncertain etiology and significance, but may account for the patient's upper airway wheezing on physical examination.Consideration for direct inspection with bronchoscopy is recommended if clinically appropriate. 2. The appearance of the chest suggests mild congestive heart failure, as above. 3. Multiple nodules throughout the lungs bilaterally (left greater than right), similar in appearance to numerous prior examinations, presumably sequela of underlying Wegner's granulomatosis. No new large cavitary lesion to suggest active parenchymal involvement at this time. 4. Mild cardiomegaly.   Electronically Signed  By: Vinnie Langton M.D.  On: 07/21/2014 13:01   Assessment/Plan: 81 y.o.  Acute Hypoxic Respiratory Failure  History of Wegener's Granulomatosis This required intubation, now extubated, slow clinical improvement Continue steroids, nebulizers, antibiotics, oxygen as needed and respiratory toilet.  Appreciate Pulmonary involvement  Anemia, chronic This has worsened in the setting of infection, malnutrition, dilution, antibiotics. She is also on Methotrexate.  Counts should improve as she is off of antibiotics at this time Hemoccult is negative Smear ordered for review.  Anemia panel is pending Monitor counts closely  May consider transfusion to maintain a Hb of 8 g  if the patient is acutely bleeding or symptomatic  Leukocytosis This is due to Prednisone, inflammation, infection No intervention is indicated at this time Will continue to monitor  Malnutrition Appreciate Nutrition follow up  DVT prophylaxis patient has a history of Pulmonary Embolism diagnosed in 1997 She is on chronic anticoagulation with Coumadin.  Appreciate Pharmacy involvement  Full Code   Other medical  issues as per admitting team     **Disclaimer: This note was dictated with voice recognition software. Similar sounding words can inadvertently be transcribed and this note may contain transcription errors which may not have been corrected upon publication of note.Sharene Butters E, PA-C 07/28/2014  11:25 AM   ADDENDUM:I saw and examined Ms. Rollene Rotunda. It has been a while since we have seen her. I agree with the above note by Clarise Cruz.  Her labs are very consistent with iron deficiency. Her ferritin is only 34. Her iron saturation is only 10%.  She will definitely benefit from IV iron.  I do think, however, that given all of her issues, that she will need to be transfused. Her hemoglobin is on the low side and with her pulmonary issues, her age, I think that a transfusion will "jump start" her system. I think if she is going to do any physical therapy, she really needs to have a better hemoglobin to improve oxygen delivery. I talked to her about this. She understands the reason why we need to do a transfusion and agrees.  I suspect that she probably does not absorb iron although at well. I suppose she may have some blood loss. Her stools have been negative for blood. There is no blood in her urine.  I looked at her blood smear and I do not see anything that looked under usual.  The family that she is on Coumadin can certainly increase the risk for blood loss.  I think that she will benefit from IV iron. I will give her dose of Feraheme at 1020 mg.  Her erythropoietin level is 127. As such, I don't think she would benefit from ESA administration.  I think that folic acid might help her. 1 mg a day of folic acid would not be a bad idea.  I will plan to see her back in  to the office in about 3 weeks. By then, we should be seeing some of the affects of the iron.  I spoke to her daughter by phone. She is in agreement with our plans for iron and transfusion.  Pete  Philippians 3:13-14

## 2014-07-28 NOTE — Progress Notes (Signed)
Pt )2 stats on RA are 96%.  Pt very weak and only able to stand up at side of bed and with limited activity pt o2 stats stayed at 96%.  O2 tent replaced to assist with humidity due to thick secretions. [name]

## 2014-07-29 DIAGNOSIS — D509 Iron deficiency anemia, unspecified: Secondary | ICD-10-CM

## 2014-07-29 LAB — CBC
HEMATOCRIT: 24.5 % — AB (ref 36.0–46.0)
Hemoglobin: 7.5 g/dL — ABNORMAL LOW (ref 12.0–15.0)
MCH: 24 pg — ABNORMAL LOW (ref 26.0–34.0)
MCHC: 30.6 g/dL (ref 30.0–36.0)
MCV: 78.3 fL (ref 78.0–100.0)
Platelets: 358 10*3/uL (ref 150–400)
RBC: 3.13 MIL/uL — AB (ref 3.87–5.11)
RDW: 20.3 % — ABNORMAL HIGH (ref 11.5–15.5)
WBC: 9.4 10*3/uL (ref 4.0–10.5)

## 2014-07-29 LAB — PROTIME-INR
INR: 1.96 — AB (ref 0.00–1.49)
Prothrombin Time: 22.5 seconds — ABNORMAL HIGH (ref 11.6–15.2)

## 2014-07-29 LAB — PREPARE RBC (CROSSMATCH)

## 2014-07-29 LAB — ERYTHROPOIETIN: Erythropoietin: 126.6 m[IU]/mL — ABNORMAL HIGH (ref 2.6–18.5)

## 2014-07-29 MED ORDER — SODIUM CHLORIDE 0.9 % IV SOLN
1020.0000 mg | Freq: Once | INTRAVENOUS | Status: AC
  Administered 2014-07-29: 1020 mg via INTRAVENOUS
  Filled 2014-07-29: qty 34

## 2014-07-29 MED ORDER — SODIUM CHLORIDE 0.9 % IV SOLN
Freq: Once | INTRAVENOUS | Status: AC
  Administered 2014-07-29: 12:00:00 via INTRAVENOUS

## 2014-07-29 MED ORDER — WARFARIN SODIUM 4 MG PO TABS
4.0000 mg | ORAL_TABLET | Freq: Once | ORAL | Status: AC
  Administered 2014-07-29: 4 mg via ORAL
  Filled 2014-07-29 (×2): qty 1

## 2014-07-29 MED ORDER — FOLIC ACID 1 MG PO TABS
1.0000 mg | ORAL_TABLET | Freq: Every day | ORAL | Status: DC
  Administered 2014-07-29 – 2014-07-30 (×2): 1 mg via ORAL
  Filled 2014-07-29 (×2): qty 1

## 2014-07-29 MED ORDER — FUROSEMIDE 10 MG/ML IJ SOLN
20.0000 mg | Freq: Once | INTRAMUSCULAR | Status: AC
  Administered 2014-07-29: 20 mg via INTRAVENOUS
  Filled 2014-07-29: qty 2

## 2014-07-29 NOTE — Progress Notes (Signed)
Report given and care assumed for this Pt. No changes noted at this time with Pt's assessment.

## 2014-07-29 NOTE — Progress Notes (Signed)
[facility] PROGRESS NOTE  KARLEY PHO UXN:[id number] DOB: 1932-10-14 DOA: 07/20/2014 PCP:  [name], MD  Assessment/Plan: Principal Problem:   HCAP (healthcare-associated pneumonia) Active Problems:   Dyspnea   Protein-calorie malnutrition, severe   Acute respiratory failure with hypoxia   Shortness of breath    Acute Respiratory Failure with Hypoxemia > slow to improve, status post extubation 1/22 Respiratory failure secondary to  fibrinous mass (fungal?) resolved Stridor> still occuring 1/23 am, seems to be associated with secretion management and also anxiety. 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 He recommends to maximize humidity, Face shield with humidified oxygen Continue Xopenex Q8 + Q6 PRN  Continue Dulera , continue raceepinephrine Fungal culture, AFB, negative bronchoalveolar lavage culture shows MRSA Restarted vancomycin 1/23, infectious disease consultation obtained and they recommend to stop all antibiotics, Dr. Megan Salon recommends Discontinue vancomycin and observe off of antibiotics Assessment form oxygen needs, will ambulate patient on room air and 2 L to document need   Sinus tachycardia resolved Continue gentle hydration   GI prophylaxis-switch Protonix to by mouth    Normocytic Anemia ,Hx PE on chronic anticoagulation FOBT negative Will request hematology consultation, patient used to see Dr. Collier [name] 56 years agonever Continue coumadin per pharmaHemoglobin stable, FOBT negative 1 Patient receiving 2 units of packed red blood cells,  ordered by oncology This will delay patient discharged Patient also has a component of iron deficiency  on anemia panel   spect fungal sinusitis  Staph species 1/2 BC > likely contaminant Doubt HCAP BCx2 1/18 >> few coag neg staph species 1/2  UC 1/19 >> neg BAL 1/20 >> few GPC in clusters, MRSA AFB (bronch) 1/20 >>  Fungal (bronch) 1/20 >>  Cytology (fibrinous mass) 1/20 >>  Culture (finbrinous mass) 1/20 >> MRSA Fungal (fibrinous mass) 1/20 >>  Vanco, start date 1/18, day 5/5 (Stopped) Zosyn, start date 1/18, day 5/5 (Stopped) Vancomycin restarted 1/23-1/25 no further antibiotics indicated    Steroid-induced hyperglycemia SSI, sensitive scale with steroids Hemoglobin A1c 5.8   Generalized weakness Start ambulating patient with physical therapy once FiO2 is tapered down to maintain saturation greater than Discharge to friend's home tomorrow No bed available today   Code Status: full Family Communication: family updated about patient's clinical progress Disposition Plan:  Continue telemetry, SNF probably ready tomorrow    Brief narrative: 81 year old female from SNF, history of Wegener's granulomatosis followed by [name], asthma who presents to the ER because of worsening shortness of breath, fever 103. The patient has not been feeling well for a couple of months gradually worsening over the last 3-4 weeks. The patient states that 2 months ago she was diagnosed with anemia, hemoglobin significantly lower than baseline. She did not have any extensive workup other than been provided with a referral to see [name]. This consult has not taken place as of yet. She denies any hematochezia, melena. She describes 15 pound weight loss in the last 2-3 years. She subsequently fell ill Christmas Eve, so her PCP office and he placed her on levofloxacin for 7 days for suspected pneumonia. Patient was subsequently seen at urgent care on 1/8 for weakness  and persistent cough  rx sx . In the ED the patient was found to have a normal white count of 5.4. Chest x-ray shows emphysema with increased interstitial markings  Consultants:  Pulmonary critical care  ENT  STUDIES:  1/19 CT Chest >> thin membranous appearing structure in proximal trachea beneath the vocal cords of uncertain etiology, mild CHF, multiple nodules throughout lungs bilaterally L>R, no new large cavitary lesions  SIGNIFICANT EVENTS: 1/18 Admit with weight loss, fever 103, weakness & persistent cough 1/19 PCCM consulted for evaluation  1/20 Decompensated with stridor, to ICU for emergent intubation > large black fibrinous mass removed from trachea 1/22 Extubated   Antibiotics: Vanco, start date 1/18, day 5/5 (Stopped) Zosyn, start date 1/18, day 5/5 (Stopped) Vancomycin restarted 1/23-1/25  HPI/Subjective: 2 stats on RA are 96%. Pt very weak and only able to stand up at side of bed and with limited activity pt o2 stats stayed at 96% denies any chest pain,     Objective: Filed Vitals:   07/29/14 0440 07/29/14 0922 07/29/14 1152 07/29/14 1230  BP: 142/74  135/59 136/57  Pulse: 73  83 79  Temp: 97.8 F (36.6 C)  98 F (36.7 C) 98.2 F (36.8 C)  TempSrc: Oral  Oral Oral  Resp: 18  20 18   Height:      Weight:      SpO2: 100% 96% 99%     Intake/Output Summary (Last 24 hours) at 07/29/14 1302 Last data filed at 07/29/14 0546  Gross per 24 hour  Intake    510 ml  Output      0 ml  Net    510 ml    Exam:  General: alert & oriented x 3, weak cough, patient on the face tent Cardiovascular: RRR, nl S1 s2  Respiratory: Decreased breath sounds at the bases, scattered rhonchi, no crackles  Abdomen: soft +BS NT/ND, no masses palpable  Extremities: No cyanosis and no edema      Data Reviewed: Basic Metabolic Panel:  Recent Labs Lab 07/23/14 0400 07/24/14 0420 07/26/14 0530 07/27/14 0530 07/28/14 0545  NA 141 142 140  --  141  K 4.0 4.4 4.3  --  3.6  CL 113*  114* 112  --  108  CO2 24 23 24   --  28  GLUCOSE 140* 159* 146*  --  97  BUN 19 29* 28*  --  23  CREATININE 0.70 0.82 0.61 0.68 0.63  CALCIUM 8.5 9.0 8.5  --  8.3*  MG  --   --   --   --  2.1    Liver Function Tests:  Recent Labs Lab 07/23/14 0400 07/26/14 0530 07/28/14 0545  AST 36 36 23  ALT 23 43* 39*  ALKPHOS 97 90 82  BILITOT 0.4 0.5 0.6  PROT 5.0* 5.0* 4.6*  ALBUMIN 2.3* 2.3* 2.2*   No results for input(s): LIPASE, AMYLASE in the last 168 hours. No results for input(s): AMMONIA in the last 168 hours.  CBC:  Recent Labs Lab 07/24/14 0420 07/25/14 1038 07/26/14 0530 07/27/14 0530 07/28/14 0545 07/29/14 0545  WBC 8.8 8.2 6.6 10.1 10.6* 9.4  NEUTROABS 8.1*  --   --   --   --   --   HGB 8.1* 7.7* 7.7* 7.9* 7.6* 7.5*  HCT 26.8* 25.2* 25.3* 25.9* 25.2* 24.5*  MCV 80.0 79.0 79.1 78.2 77.3* 78.3  PLT 421* 389 383 351 360 358    Cardiac Enzymes: No results for input(s): CKTOTAL,  CKMB, CKMBINDEX, TROPONINI in the last 168 hours. BNP (last 3 results) No results for input(s): PROBNP in the last 8760 hours.   CBG:  Recent Labs Lab 07/25/14 2036 07/26/14 0052 07/26/14 0521 07/26/14 0825 07/26/14 1218  GLUCAP 172* 128* 137* 145* 144*    Recent Results (from the past 240 hour(s))  Culture, blood (routine x 2)     Status: None   Collection Time: 07/20/14  1:20 PM  Result Value Ref Range Status   Specimen Description BLOOD RIGHT ANTECUBITAL  Final   Special Requests BOTTLES DRAWN AEROBIC AND ANAEROBIC 3ML  Final   Culture   Final    STAPHYLOCOCCUS SPECIES (COAGULASE NEGATIVE) Note: THE SIGNIFICANCE OF ISOLATING THIS ORGANISM FROM A SINGLE SET OF BLOOD CULTURES WHEN MULTIPLE SETS ARE DRAWN IS UNCERTAIN. PLEASE NOTIFY THE MICROBIOLOGY DEPARTMENT WITHIN ONE WEEK IF SPECIATION AND SENSITIVITIES ARE REQUIRED. Note: Gram Stain Report Called to,Read Back By and Verified With: JENNIFER HUFF 07/22/14 AT 1150 AM BY [facility] Performed at [facility]    Report  Status 07/23/2014 FINAL  Final  Culture, blood (routine x 2)     Status: None   Collection Time: 07/20/14  1:22 PM  Result Value Ref Range Status   Specimen Description BLOOD RIGHT FOREARM  Final   Special Requests BOTTLES DRAWN AEROBIC AND ANAEROBIC 3ML  Final   Culture   Final    NO GROWTH 5 DAYS Performed at [facility]    Report Status 07/26/2014 FINAL  Final  Urine culture     Status: None   Collection Time: 07/20/14  1:40 PM  Result Value Ref Range Status   Specimen Description URINE, CLEAN CATCH  Final   Special Requests NONE  Final   Colony Count NO GROWTH Performed at [facility]   Final   Culture NO GROWTH Performed at [facility]   Final   Report Status 07/21/2014 FINAL  Final  Culture, respiratory (NON-Expectorated)     Status: None   Collection Time: 07/22/14 12:56 PM  Result Value Ref Range Status   Specimen Description TRACHEAL ASPIRATE  Final   Special Requests NONE  Final   Gram Stain   Final    RARE WBC PRESENT, PREDOMINANTLY MONONUCLEAR RARE SQUAMOUS EPITHELIAL CELLS PRESENT FEW GRAM POSITIVE COCCI IN PAIRS IN CLUSTERS Performed at [facility]    Culture   Final    ABUNDANT METHICILLIN RESISTANT STAPHYLOCOCCUS AUREUS Note: RIFAMPIN AND GENTAMICIN SHOULD NOT BE USED AS SINGLE DRUGS FOR TREATMENT OF STAPH INFECTIONS. CRITICAL RESULT CALLED TO, READ BACK BY AND VERIFIED WITH: Clent Ridges RN [id number] 07/25/14 GUS Performed at [facility]    Report Status 07/25/2014 FINAL  Final   Organism ID, Bacteria METHICILLIN RESISTANT STAPHYLOCOCCUS AUREUS  Final      Susceptibility   Methicillin resistant staphylococcus aureus - MIC*    CLINDAMYCIN >=8 RESISTANT Resistant     ERYTHROMYCIN >=8 RESISTANT Resistant     GENTAMICIN <=0.5 SENSITIVE Sensitive     LEVOFLOXACIN >=8 RESISTANT Resistant     OXACILLIN >=4 RESISTANT Resistant     PENICILLIN >=0.5 RESISTANT Resistant     RIFAMPIN <=0.5 SENSITIVE Sensitive     TRIMETH/SULFA  >=320 RESISTANT Resistant     VANCOMYCIN 2 SENSITIVE Sensitive     TETRACYCLINE >=16 RESISTANT Resistant     * ABUNDANT METHICILLIN RESISTANT STAPHYLOCOCCUS AUREUS  Fungus Culture with Smear     Status: None (Preliminary result)   Collection Time: 07/22/14  1:05  PM  Result Value Ref Range Status   Specimen Description LUNG  Final   Special Requests NONE  Final   Fungal Smear   Final    NO YEAST OR FUNGAL ELEMENTS SEEN Performed at [facility]    Culture   Final    CULTURE IN PROGRESS FOR FOUR WEEKS Performed at [facility]    Report Status PENDING  Incomplete  MRSA PCR Screening     Status: None   Collection Time: 07/22/14  1:07 PM  Result Value Ref Range Status   MRSA by PCR NEGATIVE NEGATIVE Final    Comment:        The GeneXpert MRSA Assay (FDA approved for NASAL specimens only), is one component of a comprehensive MRSA colonization surveillance program. It is not intended to diagnose MRSA infection nor to guide or monitor treatment for MRSA infections.   Culture, respiratory (NON-Expectorated)     Status: None   Collection Time: 07/22/14  1:25 PM  Result Value Ref Range Status   Specimen Description BRONCHIAL ALVEOLAR LAVAGE  Final   Special Requests Immunocompromised  Final   Gram Stain   Final    FEW WBC PRESENT, PREDOMINANTLY MONONUCLEAR RARE SQUAMOUS EPITHELIAL CELLS PRESENT FEW GRAM POSITIVE COCCI IN PAIRS IN CLUSTERS Performed at [facility]    Culture   Final    FEW METHICILLIN RESISTANT STAPHYLOCOCCUS AUREUS Note: RIFAMPIN AND GENTAMICIN SHOULD NOT BE USED AS SINGLE DRUGS FOR TREATMENT OF STAPH INFECTIONS. CRITICAL RESULT CALLED TO, READ BACK BY AND VERIFIED WITH: Clent Ridges RN [id number] 07/25/14 GUSTK Performed at [facility]    Report Status 07/25/2014 FINAL  Final   Organism ID, Bacteria METHICILLIN RESISTANT STAPHYLOCOCCUS AUREUS  Final      Susceptibility   Methicillin resistant staphylococcus aureus - MIC*     CLINDAMYCIN >=8 RESISTANT Resistant     ERYTHROMYCIN >=8 RESISTANT Resistant     GENTAMICIN <=0.5 SENSITIVE Sensitive     LEVOFLOXACIN >=8 RESISTANT Resistant     OXACILLIN >=4 RESISTANT Resistant     PENICILLIN >=0.5 RESISTANT Resistant     RIFAMPIN <=0.5 SENSITIVE Sensitive     TRIMETH/SULFA >=320 RESISTANT Resistant     VANCOMYCIN 2 SENSITIVE Sensitive     TETRACYCLINE >=16 RESISTANT Resistant     * FEW METHICILLIN RESISTANT STAPHYLOCOCCUS AUREUS  Fungus Culture with Smear     Status: None (Preliminary result)   Collection Time: 07/22/14  1:27 PM  Result Value Ref Range Status   Specimen Description BRONCHIAL ALVEOLAR LAVAGE  Final   Special Requests Immunocompromised  Final   Fungal Smear   Final    NO YEAST OR FUNGAL ELEMENTS SEEN Performed at [facility]    Culture   Final    CULTURE IN PROGRESS FOR FOUR WEEKS Performed at [facility]    Report Status PENDING  Incomplete  AFB culture with smear     Status: None (Preliminary result)   Collection Time: 07/22/14  1:27 PM  Result Value Ref Range Status   Specimen Description BRONCHIAL ALVEOLAR LAVAGE  Final   Special Requests Immunocompromised  Final   Acid Fast Smear   Final    NO ACID FAST BACILLI SEEN Performed at [facility]    Culture   Final    CULTURE WILL BE EXAMINED FOR 6 WEEKS BEFORE ISSUING A FINAL REPORT Performed at [facility]    Report Status PENDING  Incomplete     Studies: Dg Chest  2 View (if Patient Has Fever And/or Copd)  07/20/2014   CLINICAL DATA:  Cough and shortness of breath for 1 month.  EXAM: CHEST  2 VIEW  COMPARISON:  Chest x-rays dated 07/10/2014 and 07/08/2014 and 06/25/2014 and chest CT dated 09/24/2013  FINDINGS: The heart size and pulmonary vascularity are normal. There are multiple areas of scarring in both lungs, particularly in the right upper lobe and left lung base. There is a small area of scarring laterally at the left lung base as well as  posterior medially. There is a tiny right pleural effusion, unchanged since 06/25/2014 but new since 09/24/2013.  The markings are slightly more accentuated at the bases than on the prior study.  No acute osseous abnormalities. There are emphysematous changes bilaterally.  IMPRESSION: Emphysema with scarring in both lungs. Slight increased accentuation of the interstitial markings could represent superimposed bronchitis. Persistent small right pleural effusion.   Electronically Signed   By: Rozetta Nunnery M.D.   On: 07/20/2014 14:17   Dg Chest 2 View  07/10/2014   CLINICAL DATA:  Cough and congestion .  EXAM: CHEST  2 VIEW  COMPARISON:  07/08/2014.  06/25/2014.  FINDINGS: Mediastinum and hilar structures normal. Stable diffuse bilateral pulmonary interstitial mild prominence noted suggesting chronic interstitial lung disease. Pleural parenchymal thickening consistent with scarring. COPD. Heart size is stable. No pulmonary venous congestion. Scoliosis thoracic spine with diffuse degenerative change.  IMPRESSION: Stable mild interstitial prominence consistent with chronic interstitial lung disease. Pleural parenchymal scarring. COPD. No acute cardiopulmonary disease.   Electronically Signed   By: Marcello Moores  Register   On: 07/10/2014 12:32   Ct Chest W Contrast  07/21/2014   CLINICAL DATA:  81 year old female with history of small vessel vasculitis (granulomatous polyangitis) with history of airway stenosis, presenting with loud wheeze and scant hemoptysis. Evaluate for potential parenchymal involvement of the lungs versus airway stenosis. Shortness of breath, wheezing and fever for 1 month.  EXAM: CT CHEST WITH CONTRAST  TECHNIQUE: Multidetector CT imaging of the chest was performed during intravenous contrast administration.  CONTRAST:  90mL OMNIPAQUE IOHEXOL 300 MG/ML  SOLN  COMPARISON:  Chest CT 09/24/2013.  FINDINGS: Comment: Examination is limited by considerable patient respiratory motion.  Mediastinum/Lymph  Nodes: Heart size is mildly enlarged. There is no significant pericardial fluid, thickening or pericardial calcification. Calcifications of the mitral annulus. No pathologically enlarged mediastinal or hilar lymph nodes. Esophagus is unremarkable in appearance.  Lungs/Pleura: In the proximal trachea shortly beneath the level of the vocal cords There is some thin irregular-shaped soft tissue which has the appearance of a small membrane, which appears intimately associated with the wall of the trachea. The trachea and mainstem bronchi are otherwise widely patent. Again noted are nodular appearing areas of architectural distortion in the lungs bilaterally, most notably in the left lower lobe where there is a 1.8 x 1.3 cm spiculated appearing nodular density in the superior segment of the left lower lobe, and a 2.4 x 1.5 cm spiculated appearing nodular density in the posterior aspect of the left lower lobe. Multiple other smaller nodules are noted. These two largest lesions appear more prominent than the prior study from 09/24/2013, however, when compared to more remote prior studies such that is 01/03/2011, both of these lesions appear slightly smaller, and are presumably areas of chronic postinflammatory scarring given the patient's history of vasculitis. There is a background of mild diffuse ground-glass attenuation with mild interlobular septal thickening, suggesting a background of mild interstitial pulmonary edema. Moderate  bilateral pleural effusions are noted. Large bulla in the apex of the left hemithorax is unchanged.  Musculoskeletal/Soft Tissues: There are no aggressive appearing lytic or blastic lesions noted in the visualized portions of the skeleton.  Upper Abdomen: Unremarkable.  IMPRESSION: 1. There is a thin membranous appearing structure in the proximal trachea immediately beneath the level the vocal cords, which is of uncertain etiology and significance, but may account for the patient's upper airway  wheezing on physical examination. Consideration for direct inspection with bronchoscopy is recommended if clinically appropriate. 2. The appearance of the chest suggests mild congestive heart failure, as above. 3. Multiple nodules throughout the lungs bilaterally (left greater than right), similar in appearance to numerous prior examinations, presumably sequela of underlying Wegner's granulomatosis. No new large cavitary lesion to suggest active parenchymal involvement at this time. 4. Mild cardiomegaly.   Electronically Signed   By: Vinnie Langton M.D.   On: 07/21/2014 13:01   Ct Maxillofacial W/cm  07/22/2014   CLINICAL DATA:  Wegner's granulomatosis. Question fungal sinusitis as cause of airway debris.  EXAM: CT MAXILLOFACIAL WITH CONTRAST  TECHNIQUE: Multidetector CT imaging of the maxillofacial structures was performed with intravenous contrast. Multiplanar CT image reconstructions were also generated. A small metallic BB was placed on the right temple in order to reliably differentiate right from left.  CONTRAST:  173mL OMNIPAQUE IOHEXOL 300 MG/ML  SOLN  COMPARISON:  05/08/2008  FINDINGS: Chronic pansinusitis status post medial maxillary antrostomies, bilateral turbinectomies, and ethmoidectomies. The right sphenoid sinus has also been opened. Mucosal thickening is diffuse throughout the sinuses and there are small layering effusions within the maxillary and right sphenoid sinuses. The surgical openings are patent, although the right sphenoidotomy is moderately narrowed by mucosal disease. There is a small perforation in the anterior lower nasal septum. These changes are all consistent with history of Wegner's granulomatosis. Concerning the clinical question of fungal sinusitis, there certainly could be fungal colonization (some of the sinus material is high-density), but no fungal mass or invasive sinusitis is detected to explain the obstructive airway mass on bronchoscopy.  Complete opacification of the  bilateral mastoid and middle ear cavities. No wrist changes.  Bilateral cataract resection.  Otherwise, the orbits are negative.  Limited evaluation of the oral cavity, oropharynx, and hypopharynx due to intubation with retained fluid.  IMPRESSION: 1. Wegner's granulomatosis with pansinusitis and otomastoiditis described above. 2. Concerning the clinical question, there may be sinus fungal colonization but there is no visible mycetoma or invasive sinusitis.   Electronically Signed   By: Jorje Guild M.D.   On: 07/22/2014 22:34   Dg Chest Port 1 View  07/24/2014   CLINICAL DATA:  Acute respiratory failure.  Hypoxia.  EXAM: PORTABLE CHEST - 1 VIEW  COMPARISON:  07/23/2014  FINDINGS: Left PICC line tip: Lower SVC. Mild enlargement of the cardiopericardial silhouette diffuse interstitial accentuation. Suspected layering left pleural effusion. Left mid lung hazy opacity mildly improved.  Atherosclerotic calcification of the thoracic aorta. Probable small right pleural effusion. Obscuration of the left hemidiaphragm, probably due to the pleural effusion and passive atelectasis.  IMPRESSION: 1. The dominant changes reduced hazy density in the left mid lung probably due to redistribution of left pleural effusion. There may be some slight improvement in the underlying interstitial accentuation which is probably a manifestation of pulmonary edema, less likely from atypical infectious process.   Electronically Signed   By: Sherryl Barters M.D.   On: 07/24/2014 10:13   Dg Chest [facility]  07/23/2014   CLINICAL DATA:  Acute respiratory failure  EXAM: PORTABLE CHEST - 1 VIEW  COMPARISON:  07/22/2014  FINDINGS: Cardiac shadow is stable. An endotracheal tube, left-sided PICC line and nasogastric catheter are stable in appearance. Increasing density is noted in the left retrocardiac region consistent with atelectasis. Diffuse vascular congestion is again identified.  IMPRESSION: Increasing left lower lobe atelectasis.  The remainder of the exam is stable.   Electronically Signed   By: Inez Catalina M.D.   On: 07/23/2014 07:13   Dg Chest Port 1 View  07/22/2014   CLINICAL DATA:  Respiratory failure.  Endotracheal tube placement.  EXAM: PORTABLE CHEST - 1 VIEW  COMPARISON:  07/20/2014  FINDINGS: The endotracheal to is 3.5 cm above the carina. The NG tube is in the stomach. The cardiac silhouette, mediastinal and hilar contours are stable. Moderate pulmonary edema. Small pleural effusions.  IMPRESSION: The endotracheal tube and NG tubes are in good position.  Moderate pulmonary edema.   Electronically Signed   By: Kalman Jewels M.D.   On: 07/22/2014 14:21   Dg Chest Port 1 View  07/21/2014   CLINICAL DATA:  F/u SOB, droplet precautions, hx. Asthma, fever, cough and congestion, chest tightness and wheezing  EXAM: PORTABLE CHEST - 1 VIEW  COMPARISON:  the previous day's study  FINDINGS: Increase in diffuse interstitial infiltrates or edema, with peripheral septal lines and some patchy new airspace opacities in bilateral lung bases. Heart size upper limits normal for technique. No effusion. Mild spondylitic changes in the mid thoracic spine.  IMPRESSION: 1. Progressive bilateral edema/infiltrates as above.   Electronically Signed   By: Arne Cleveland M.D.   On: 07/21/2014 11:02    Scheduled Meds: . antiseptic oral rinse  7 mL Mouth Rinse q12n4p  . chlorhexidine  15 mL Mouth Rinse BID  . folic acid  1 mg Oral Daily  . furosemide  20 mg Intravenous Once  . latanoprost  1 drop Both Eyes QHS  . levalbuterol  1.25 mg Nebulization 3 times per day  . mometasone-formoterol  2 puff Inhalation BID  . pantoprazole  40 mg Oral BID  . polyethylene glycol  17 g Oral Daily  . predniSONE  40 mg Oral Q breakfast  . sodium chloride  1 spray Each Nare BID  . sodium chloride  10-40 mL Intracatheter Q12H  . sodium chloride  3 mL Intravenous Q12H  . timolol  1 drop Left Eye Q breakfast  . warfarin  4 mg Oral Once  . Warfarin -  Pharmacist Dosing Inpatient   Does not apply q1800  . zolpidem  5 mg Oral QHS   Continuous Infusions:    Principal Problem:   HCAP (healthcare-associated pneumonia) Active Problems:   Dyspnea   Protein-calorie malnutrition, severe   Acute respiratory failure with hypoxia   Shortness of breath    Time spent: 40 minutes   [facility] Pager [phone]. If 7PM-7AM, please contact night-coverage at [email or web address], password [facility] 07/29/2014, 1:02 PM  LOS: 9 days

## 2014-07-29 NOTE — Progress Notes (Signed)
[place] for Warfarin Indication: Hx PE  Allergies  Allergen Reactions  . Adhesive [Tape] Rash    Patient Measurements: Height: 5\' 2"  (157.5 cm) Weight: 144 lb 2.9 oz (65.4 kg) IBW/kg (Calculated) : 50.1  Vital Signs: Temp: 97.8 F (36.6 C) (01/27 0440) Temp Source: Oral (01/27 0440) BP: 142/74 mmHg (01/27 0440) Pulse Rate: 73 (01/27 0440)  Labs:  Recent Labs  07/27/14 0530 07/28/14 0545 07/29/14 0545  HGB 7.9* 7.6*  --   HCT 25.9* 25.2*  --   PLT 351 360  --   LABPROT 29.3* 24.4* 22.5*  INR 2.75* 2.18* 1.96*  CREATININE 0.68 0.63  --     Estimated Creatinine Clearance: 50.6 mL/min (by C-G formula based on Cr of 0.63).  Assessment: 8 [facility]F [facility], fever, and worsening productive cough x 1 month. Note PMHx of PE on chronic warfarin anticoagulation.  Pharmacy consulted to resume warfarin inpatient.   Home dose warfarin: 2.5mg  daily except 3.75mg  on M/W/F.  Significant events 1/20 warfarin dose not given (pt was intubated and NPO, but did have OG tube access.)  1/24 LUE venous duplex w/ superficial thrombus in left cephalic vein.  Negative for DVT. 1/27 Feraheme 1020mg  IV once, transfuse 2 units PRBC  Today, 07/29/2014  INR 1.96, slightly subtherapeutic after lower doses  CBC: Hgb remians low/decreased at 7.5,and Plt remain WNL  FOBT negative 1/19   No bleeding or complications reported  Planning iron and PRBC transfusion today.  Diet: Regular  Drug interactions: abx/steroids may reduce warfarin needs  Goal of Therapy:  INR 2-3 Monitor platelets by anticoagulation protocol: Yes   Plan:   Warfarin 4 mg PO x1 today at 1200  Daily PT/INR, CBC  Monitor for bleeding  For discharge, recommend resuming home warfarin 2.5mg  daily except 3.75mg  on M/W/F with INR recheck on Friday, 1/29.  Gretta Arab PharmD, BCPS Pager [phone] 07/29/2014 7:49 AM

## 2014-07-30 LAB — TYPE AND SCREEN
ABO/RH(D): O POS
Antibody Screen: NEGATIVE
UNIT DIVISION: 0
Unit division: 0

## 2014-07-30 LAB — CBC
HEMATOCRIT: 33.3 % — AB (ref 36.0–46.0)
Hemoglobin: 10.7 g/dL — ABNORMAL LOW (ref 12.0–15.0)
MCH: 25.9 pg — ABNORMAL LOW (ref 26.0–34.0)
MCHC: 32.1 g/dL (ref 30.0–36.0)
MCV: 80.6 fL (ref 78.0–100.0)
PLATELETS: 296 10*3/uL (ref 150–400)
RBC: 4.13 MIL/uL (ref 3.87–5.11)
RDW: 20.3 % — ABNORMAL HIGH (ref 11.5–15.5)
WBC: 9 10*3/uL (ref 4.0–10.5)

## 2014-07-30 LAB — PROTIME-INR
INR: 1.95 — ABNORMAL HIGH (ref 0.00–1.49)
PROTHROMBIN TIME: 22.4 s — AB (ref 11.6–15.2)

## 2014-07-30 LAB — HEMOGLOBINOPATHY EVALUATION
Hemoglobin Other: 0 %
Hgb A2 Quant: 2.3 % (ref 2.2–3.2)
Hgb A: 97.7 % (ref 96.8–97.8)
Hgb F Quant: 0 % (ref 0.0–2.0)
Hgb S Quant: 0 %

## 2014-07-30 MED ORDER — PANTOPRAZOLE SODIUM 40 MG PO TBEC: 40.0000 mg | DELAYED_RELEASE_TABLET | Freq: Two times a day (BID) | ORAL | Status: DC

## 2014-07-30 MED ORDER — FOLIC ACID 1 MG PO TABS: 1.0000 mg | ORAL_TABLET | Freq: Every day | ORAL | Status: DC

## 2014-07-30 MED ORDER — ACETAMINOPHEN 325 MG PO TABS: 650.0000 mg | ORAL_TABLET | Freq: Four times a day (QID) | ORAL | Status: DC | PRN

## 2014-07-30 MED ORDER — POLYETHYLENE GLYCOL 3350 17 G PO PACK: 17.0000 g | PACK | Freq: Every day | ORAL | Status: DC

## 2014-07-30 MED ORDER — LORAZEPAM 0.5 MG PO TABS: 0.5000 mg | ORAL_TABLET | Freq: Four times a day (QID) | ORAL | Status: DC | PRN

## 2014-07-30 MED ORDER — PREDNISONE 5 MG PO TABS: ORAL_TABLET | ORAL | Status: DC

## 2014-07-30 MED ORDER — LEVALBUTEROL HCL 0.63 MG/3ML IN NEBU: 0.6300 mg | INHALATION_SOLUTION | Freq: Four times a day (QID) | RESPIRATORY_TRACT | Status: DC | PRN

## 2014-07-30 MED ORDER — WARFARIN SODIUM 4 MG PO TABS
4.0000 mg | ORAL_TABLET | Freq: Once | ORAL | Status: AC
  Administered 2014-07-30: 4 mg via ORAL
  Filled 2014-07-30: qty 1

## 2014-07-30 MED ORDER — HEPARIN SOD (PORK) LOCK FLUSH 100 UNIT/ML IV SOLN: 500.0000 [IU] | INTRAVENOUS | Status: DC | PRN

## 2014-07-30 MED ORDER — PREDNISONE 20 MG PO TABS: 40.0000 mg | ORAL_TABLET | Freq: Every day | ORAL | Status: DC

## 2014-07-30 NOTE — Discharge Summary (Signed)
Physician Discharge Summary  [name] MRN: [id number] DOB/AGE: 08-26-1932 81 y.o.  PCP:  [name], MD   Admit date: 07/20/2014 Discharge date: 07/30/2014  Discharge Diagnoses:      HCAP (healthcare-associated pneumonia) Active Problems:   Dyspnea   Protein-calorie malnutrition, severe   Acute respiratory failure with hypoxia   Shortness of breath  Follow-up recommendations Follow-up with PCP in 5-7 days Follow-up CBC, BMP in one week Follow-up with pulmonary as scheduled     Medication List    STOP taking these medications        diphenhydramine-acetaminophen 25-500 MG Tabs  Commonly known as:  TYLENOL PM      TAKE these medications        acetaminophen 325 MG tablet  Commonly known as:  TYLENOL  Take 2 tablets (650 mg total) by mouth every 6 (six) hours as needed for mild pain (or Fever >/= 101).     ADVAIR DISKUS 500-50 MCG/DOSE Aepb  Generic drug:  Fluticasone-Salmeterol  INHALE 1 PUFF INTO LUNGS EVERY 12 HOURS     b complex vitamins capsule  Take 1 capsule by mouth daily.     calcium-vitamin D 500-200 MG-UNIT per tablet  Commonly known as:  OSCAL WITH D  Take 1 tablet by mouth daily with breakfast.     folic acid 1 MG tablet  Commonly known as:  FOLVITE  Take 1 tablet (1 mg total) by mouth daily.     furosemide 20 MG tablet  Commonly known as:  LASIX  Take 20 mg by mouth daily.     latanoprost 0.005 % ophthalmic solution  Commonly known as:  XALATAN  Place 1 drop into both eyes at bedtime.     levalbuterol 0.63 MG/3ML nebulizer solution  Commonly known as:  XOPENEX  Take 3 mLs (0.63 mg total) by nebulization every 6 (six) hours as needed for wheezing or shortness of breath.     LORazepam 0.5 MG tablet  Commonly known as:  ATIVAN  Take 1 tablet (0.5 mg total) by mouth every 6 (six) hours as needed for anxiety.     MELATONIN PO  Take 1-3 tablets by mouth at bedtime as needed (for sleep).     multivitamin with minerals tablet   Take 1 tablet by mouth daily.     pantoprazole 40 MG tablet  Commonly known as:  PROTONIX  Take 1 tablet (40 mg total) by mouth 2 (two) times daily.     polyethylene glycol packet  Commonly known as:  MIRALAX / GLYCOLAX  Take 17 g by mouth daily.     predniSONE 20 MG tablet  Commonly known as:  DELTASONE  Take 2 tablets (40 mg total) by mouth daily with breakfast.     RECLAST 5 MG/100ML Soln injection  Generic drug:  zoledronic acid  Inject 5 mg into the vein once. yearly     timolol 0.5 % ophthalmic solution  Commonly known as:  BETIMOL  Place 1 drop into the left eye daily with breakfast.     VITAMIN D PO  Take 1 tablet by mouth daily.     warfarin 2.5 MG tablet  Commonly known as:  COUMADIN  Take 2.5-3.75 mg by mouth See admin instructions. Takes 2.5mg  everyday except 3.75 on Monday, Wednesday, and Friday        Discharge Condition: Stable   Disposition: SNF    Consults: ENT Pulmonary Hematology    Significant Diagnostic Studies: Dg Chest 2 View (if  Patient Has Fever And/or Copd)  07/20/2014   CLINICAL DATA:  Cough and shortness of breath for 1 month.  EXAM: CHEST  2 VIEW  COMPARISON:  Chest x-rays dated 07/10/2014 and 07/08/2014 and 06/25/2014 and chest CT dated 09/24/2013  FINDINGS: The heart size and pulmonary vascularity are normal. There are multiple areas of scarring in both lungs, particularly in the right upper lobe and left lung base. There is a small area of scarring laterally at the left lung base as well as posterior medially. There is a tiny right pleural effusion, unchanged since 06/25/2014 but new since 09/24/2013.  The markings are slightly more accentuated at the bases than on the prior study.  No acute osseous abnormalities. There are emphysematous changes bilaterally.  IMPRESSION: Emphysema with scarring in both lungs. Slight increased accentuation of the interstitial markings could represent superimposed bronchitis. Persistent small right  pleural effusion.   Electronically Signed   By: Rozetta Nunnery M.D.   On: 07/20/2014 14:17   Dg Chest 2 View  07/10/2014   CLINICAL DATA:  Cough and congestion .  EXAM: CHEST  2 VIEW  COMPARISON:  07/08/2014.  06/25/2014.  FINDINGS: Mediastinum and hilar structures normal. Stable diffuse bilateral pulmonary interstitial mild prominence noted suggesting chronic interstitial lung disease. Pleural parenchymal thickening consistent with scarring. COPD. Heart size is stable. No pulmonary venous congestion. Scoliosis thoracic spine with diffuse degenerative change.  IMPRESSION: Stable mild interstitial prominence consistent with chronic interstitial lung disease. Pleural parenchymal scarring. COPD. No acute cardiopulmonary disease.   Electronically Signed   By: Marcello Moores  Register   On: 07/10/2014 12:32   Ct Chest W Contrast  07/21/2014   CLINICAL DATA:  81 year old female with history of small vessel vasculitis (granulomatous polyangitis) with history of airway stenosis, presenting with loud wheeze and scant hemoptysis. Evaluate for potential parenchymal involvement of the lungs versus airway stenosis. Shortness of breath, wheezing and fever for 1 month.  EXAM: CT CHEST WITH CONTRAST  TECHNIQUE: Multidetector CT imaging of the chest was performed during intravenous contrast administration.  CONTRAST:  69mL OMNIPAQUE IOHEXOL 300 MG/ML  SOLN  COMPARISON:  Chest CT 09/24/2013.  FINDINGS: Comment: Examination is limited by considerable patient respiratory motion.  Mediastinum/Lymph Nodes: Heart size is mildly enlarged. There is no significant pericardial fluid, thickening or pericardial calcification. Calcifications of the mitral annulus. No pathologically enlarged mediastinal or hilar lymph nodes. Esophagus is unremarkable in appearance.  Lungs/Pleura: In the proximal trachea shortly beneath the level of the vocal cords There is some thin irregular-shaped soft tissue which has the appearance of a small membrane, which  appears intimately associated with the wall of the trachea. The trachea and mainstem bronchi are otherwise widely patent. Again noted are nodular appearing areas of architectural distortion in the lungs bilaterally, most notably in the left lower lobe where there is a 1.8 x 1.3 cm spiculated appearing nodular density in the superior segment of the left lower lobe, and a 2.4 x 1.5 cm spiculated appearing nodular density in the posterior aspect of the left lower lobe. Multiple other smaller nodules are noted. These two largest lesions appear more prominent than the prior study from 09/24/2013, however, when compared to more remote prior studies such that is 01/03/2011, both of these lesions appear slightly smaller, and are presumably areas of chronic postinflammatory scarring given the patient's history of vasculitis. There is a background of mild diffuse ground-glass attenuation with mild interlobular septal thickening, suggesting a background of mild interstitial pulmonary edema. Moderate bilateral pleural effusions  are noted. Large bulla in the apex of the left hemithorax is unchanged.  Musculoskeletal/Soft Tissues: There are no aggressive appearing lytic or blastic lesions noted in the visualized portions of the skeleton.  Upper Abdomen: Unremarkable.  IMPRESSION: 1. There is a thin membranous appearing structure in the proximal trachea immediately beneath the level the vocal cords, which is of uncertain etiology and significance, but may account for the patient's upper airway wheezing on physical examination. Consideration for direct inspection with bronchoscopy is recommended if clinically appropriate. 2. The appearance of the chest suggests mild congestive heart failure, as above. 3. Multiple nodules throughout the lungs bilaterally (left greater than right), similar in appearance to numerous prior examinations, presumably sequela of underlying Wegner's granulomatosis. No new large cavitary lesion to suggest  active parenchymal involvement at this time. 4. Mild cardiomegaly.   Electronically Signed   By: Vinnie Langton M.D.   On: 07/21/2014 13:01   Ct Maxillofacial W/cm  07/22/2014   CLINICAL DATA:  Wegner's granulomatosis. Question fungal sinusitis as cause of airway debris.  EXAM: CT MAXILLOFACIAL WITH CONTRAST  TECHNIQUE: Multidetector CT imaging of the maxillofacial structures was performed with intravenous contrast. Multiplanar CT image reconstructions were also generated. A small metallic BB was placed on the right temple in order to reliably differentiate right from left.  CONTRAST:  182mL OMNIPAQUE IOHEXOL 300 MG/ML  SOLN  COMPARISON:  05/08/2008  FINDINGS: Chronic pansinusitis status post medial maxillary antrostomies, bilateral turbinectomies, and ethmoidectomies. The right sphenoid sinus has also been opened. Mucosal thickening is diffuse throughout the sinuses and there are small layering effusions within the maxillary and right sphenoid sinuses. The surgical openings are patent, although the right sphenoidotomy is moderately narrowed by mucosal disease. There is a small perforation in the anterior lower nasal septum. These changes are all consistent with history of Wegner's granulomatosis. Concerning the clinical question of fungal sinusitis, there certainly could be fungal colonization (some of the sinus material is high-density), but no fungal mass or invasive sinusitis is detected to explain the obstructive airway mass on bronchoscopy.  Complete opacification of the bilateral mastoid and middle ear cavities. No wrist changes.  Bilateral cataract resection.  Otherwise, the orbits are negative.  Limited evaluation of the oral cavity, oropharynx, and hypopharynx due to intubation with retained fluid.  IMPRESSION: 1. Wegner's granulomatosis with pansinusitis and otomastoiditis described above. 2. Concerning the clinical question, there may be sinus fungal colonization but there is no visible mycetoma or  invasive sinusitis.   Electronically Signed   By: Jorje Guild M.D.   On: 07/22/2014 22:34   Dg Chest Port 1 View  07/24/2014   CLINICAL DATA:  Acute respiratory failure.  Hypoxia.  EXAM: PORTABLE CHEST - 1 VIEW  COMPARISON:  07/23/2014  FINDINGS: Left PICC line tip: Lower SVC. Mild enlargement of the cardiopericardial silhouette diffuse interstitial accentuation. Suspected layering left pleural effusion. Left mid lung hazy opacity mildly improved.  Atherosclerotic calcification of the thoracic aorta. Probable small right pleural effusion. Obscuration of the left hemidiaphragm, probably due to the pleural effusion and passive atelectasis.  IMPRESSION: 1. The dominant changes reduced hazy density in the left mid lung probably due to redistribution of left pleural effusion. There may be some slight improvement in the underlying interstitial accentuation which is probably a manifestation of pulmonary edema, less likely from atypical infectious process.   Electronically Signed   By: Sherryl Barters M.D.   On: 07/24/2014 10:13   Dg Chest Port 1 View  07/23/2014  CLINICAL DATA:  Acute respiratory failure  EXAM: PORTABLE CHEST - 1 VIEW  COMPARISON:  07/22/2014  FINDINGS: Cardiac shadow is stable. An endotracheal tube, left-sided PICC line and nasogastric catheter are stable in appearance. Increasing density is noted in the left retrocardiac region consistent with atelectasis. Diffuse vascular congestion is again identified.  IMPRESSION: Increasing left lower lobe atelectasis. The remainder of the exam is stable.   Electronically Signed   By: Inez Catalina M.D.   On: 07/23/2014 07:13   Dg Chest Port 1 View  07/22/2014   CLINICAL DATA:  Respiratory failure.  Endotracheal tube placement.  EXAM: PORTABLE CHEST - 1 VIEW  COMPARISON:  07/20/2014  FINDINGS: The endotracheal to is 3.5 cm above the carina. The NG tube is in the stomach. The cardiac silhouette, mediastinal and hilar contours are stable. Moderate  pulmonary edema. Small pleural effusions.  IMPRESSION: The endotracheal tube and NG tubes are in good position.  Moderate pulmonary edema.   Electronically Signed   By: Kalman Jewels M.D.   On: 07/22/2014 14:21   Dg Chest Port 1 View  07/21/2014   CLINICAL DATA:  F/u SOB, droplet precautions, hx. Asthma, fever, cough and congestion, chest tightness and wheezing  EXAM: PORTABLE CHEST - 1 VIEW  COMPARISON:  the previous day's study  FINDINGS: Increase in diffuse interstitial infiltrates or edema, with peripheral septal lines and some patchy new airspace opacities in bilateral lung bases. Heart size upper limits normal for technique. No effusion. Mild spondylitic changes in the mid thoracic spine.  IMPRESSION: 1. Progressive bilateral edema/infiltrates as above.   Electronically Signed   By: Arne Cleveland M.D.   On: 07/21/2014 11:02       Microbiology: Recent Results (from the past 240 hour(s))  Culture, respiratory (NON-Expectorated)     Status: None   Collection Time: 07/22/14 12:56 PM  Result Value Ref Range Status   Specimen Description TRACHEAL ASPIRATE  Final   Special Requests NONE  Final   Gram Stain   Final    RARE WBC PRESENT, PREDOMINANTLY MONONUCLEAR RARE SQUAMOUS EPITHELIAL CELLS PRESENT FEW GRAM POSITIVE COCCI IN PAIRS IN CLUSTERS Performed at [facility]    Culture   Final    ABUNDANT METHICILLIN RESISTANT STAPHYLOCOCCUS AUREUS Note: RIFAMPIN AND GENTAMICIN SHOULD NOT BE USED AS SINGLE DRUGS FOR TREATMENT OF STAPH INFECTIONS. CRITICAL RESULT CALLED TO, READ BACK BY AND VERIFIED WITH: Clent Ridges RN [id number] 07/25/14 GUS Performed at [facility]    Report Status 07/25/2014 FINAL  Final   Organism ID, Bacteria METHICILLIN RESISTANT STAPHYLOCOCCUS AUREUS  Final      Susceptibility   Methicillin resistant staphylococcus aureus - MIC*    CLINDAMYCIN >=8 RESISTANT Resistant     ERYTHROMYCIN >=8 RESISTANT Resistant     GENTAMICIN <=0.5 SENSITIVE Sensitive      LEVOFLOXACIN >=8 RESISTANT Resistant     OXACILLIN >=4 RESISTANT Resistant     PENICILLIN >=0.5 RESISTANT Resistant     RIFAMPIN <=0.5 SENSITIVE Sensitive     TRIMETH/SULFA >=320 RESISTANT Resistant     VANCOMYCIN 2 SENSITIVE Sensitive     TETRACYCLINE >=16 RESISTANT Resistant     * ABUNDANT METHICILLIN RESISTANT STAPHYLOCOCCUS AUREUS  Fungus Culture with Smear     Status: None (Preliminary result)   Collection Time: 07/22/14  1:05 PM  Result Value Ref Range Status   Specimen Description LUNG  Final   Special Requests NONE  Final   Fungal Smear   Final    NO  YEAST OR FUNGAL ELEMENTS SEEN Performed at [facility]    Culture   Final    CULTURE IN PROGRESS FOR FOUR WEEKS Performed at [facility]    Report Status PENDING  Incomplete  MRSA PCR Screening     Status: None   Collection Time: 07/22/14  1:07 PM  Result Value Ref Range Status   MRSA by PCR NEGATIVE NEGATIVE Final    Comment:        The GeneXpert MRSA Assay (FDA approved for NASAL specimens only), is one component of a comprehensive MRSA colonization surveillance program. It is not intended to diagnose MRSA infection nor to guide or monitor treatment for MRSA infections.   Culture, respiratory (NON-Expectorated)     Status: None   Collection Time: 07/22/14  1:25 PM  Result Value Ref Range Status   Specimen Description BRONCHIAL ALVEOLAR LAVAGE  Final   Special Requests Immunocompromised  Final   Gram Stain   Final    FEW WBC PRESENT, PREDOMINANTLY MONONUCLEAR RARE SQUAMOUS EPITHELIAL CELLS PRESENT FEW GRAM POSITIVE COCCI IN PAIRS IN CLUSTERS Performed at [facility]    Culture   Final    FEW METHICILLIN RESISTANT STAPHYLOCOCCUS AUREUS Note: RIFAMPIN AND GENTAMICIN SHOULD NOT BE USED AS SINGLE DRUGS FOR TREATMENT OF STAPH INFECTIONS. CRITICAL RESULT CALLED TO, READ BACK BY AND VERIFIED WITH: Clent Ridges RN [id number] 07/25/14 GUSTK Performed at [facility]    Report Status 07/25/2014  FINAL  Final   Organism ID, Bacteria METHICILLIN RESISTANT STAPHYLOCOCCUS AUREUS  Final      Susceptibility   Methicillin resistant staphylococcus aureus - MIC*    CLINDAMYCIN >=8 RESISTANT Resistant     ERYTHROMYCIN >=8 RESISTANT Resistant     GENTAMICIN <=0.5 SENSITIVE Sensitive     LEVOFLOXACIN >=8 RESISTANT Resistant     OXACILLIN >=4 RESISTANT Resistant     PENICILLIN >=0.5 RESISTANT Resistant     RIFAMPIN <=0.5 SENSITIVE Sensitive     TRIMETH/SULFA >=320 RESISTANT Resistant     VANCOMYCIN 2 SENSITIVE Sensitive     TETRACYCLINE >=16 RESISTANT Resistant     * FEW METHICILLIN RESISTANT STAPHYLOCOCCUS AUREUS  Fungus Culture with Smear     Status: None (Preliminary result)   Collection Time: 07/22/14  1:27 PM  Result Value Ref Range Status   Specimen Description BRONCHIAL ALVEOLAR LAVAGE  Final   Special Requests Immunocompromised  Final   Fungal Smear   Final    NO YEAST OR FUNGAL ELEMENTS SEEN Performed at [facility]    Culture   Final    CULTURE IN PROGRESS FOR FOUR WEEKS Performed at [facility]    Report Status PENDING  Incomplete  AFB culture with smear     Status: None (Preliminary result)   Collection Time: 07/22/14  1:27 PM  Result Value Ref Range Status   Specimen Description BRONCHIAL ALVEOLAR LAVAGE  Final   Special Requests Immunocompromised  Final   Acid Fast Smear   Final    NO ACID FAST BACILLI SEEN Performed at [facility]    Culture   Final    CULTURE WILL BE EXAMINED FOR 6 WEEKS BEFORE ISSUING A FINAL REPORT Performed at [facility]    Report Status PENDING  Incomplete     Labs: Results for orders placed or performed during the hospital encounter of 07/20/14 (from the past 48 hour(s))  Protime-INR     Status: Abnormal   Collection Time: 07/29/14  5:45 AM  Result Value Ref Range   Prothrombin Time 22.5 (H) 11.6 - 15.2 seconds   INR 1.96 (H) 0.00 - 1.49  CBC     Status: Abnormal   Collection Time: 07/29/14   5:45 AM  Result Value Ref Range   WBC 9.4 4.0 - 10.5 K/uL   RBC 3.13 (L) 3.87 - 5.11 MIL/uL   Hemoglobin 7.5 (L) 12.0 - 15.0 g/dL   HCT 24.5 (L) 36.0 - 46.0 %   MCV 78.3 78.0 - 100.0 fL   MCH 24.0 (L) 26.0 - 34.0 pg   MCHC 30.6 30.0 - 36.0 g/dL   RDW 20.3 (H) 11.5 - 15.5 %   Platelets 358 150 - 400 K/uL  Prepare RBC     Status: None   Collection Time: 07/29/14  8:00 AM  Result Value Ref Range   Order Confirmation ORDER PROCESSED BY BLOOD BANK   Type and screen     Status: None   Collection Time: 07/29/14  8:30 AM  Result Value Ref Range   ABO/RH(D) O POS    Antibody Screen NEG    Sample Expiration 08/01/2014    Unit Number [id number]    Blood Component Type RED CELLS,LR    Unit division 00    Status of Unit ISSUED,FINAL    Transfusion Status OK TO TRANSFUSE    Crossmatch Result Compatible    Unit Number [id number]    Blood Component Type RED CELLS,LR    Unit division 00    Status of Unit ISSUED,FINAL    Transfusion Status OK TO TRANSFUSE    Crossmatch Result Compatible   Protime-INR     Status: Abnormal   Collection Time: 07/30/14  5:35 AM  Result Value Ref Range   Prothrombin Time 22.4 (H) 11.6 - 15.2 seconds   INR 1.95 (H) 0.00 - 1.49  CBC     Status: Abnormal   Collection Time: 07/30/14  5:35 AM  Result Value Ref Range   WBC 9.0 4.0 - 10.5 K/uL   RBC 4.13 3.87 - 5.11 MIL/uL   Hemoglobin 10.7 (L) 12.0 - 15.0 g/dL    Comment: DELTA CHECK NOTED REPEATED TO VERIFY    HCT 33.3 (L) 36.0 - 46.0 %   MCV 80.6 78.0 - 100.0 fL   MCH 25.9 (L) 26.0 - 34.0 pg   MCHC 32.1 30.0 - 36.0 g/dL   RDW 20.3 (H) 11.5 - 15.5 %   Platelets 296 150 - 400 K/uL     HPI :Brief narrative: 81 year old female from SNF, history of Wegener's granulomatosis followed by [name], asthma who presents to the ER because of worsening shortness of breath, fever 103. The patient has not been feeling well for a couple of months gradually worsening over the last 3-4 weeks. The patient  states that 2 months ago she was diagnosed with anemia, hemoglobin significantly lower than baseline. She did not have any extensive workup other than been provided with a referral to see [name]. This consult has not taken place as of yet. She denies any hematochezia, melena. She describes 15 pound weight loss in the last 2-3 years. She subsequently fell ill Christmas Eve, so her PCP office and he placed her on levofloxacin for 7 days for suspected pneumonia. Patient was subsequently seen at urgent care on 1/8 for weakness and persistent cough rx sx . In the ED the patient was found to have a normal white count of 5.4. Chest x-ray shows emphysema with increased interstitial markings  STUDIES:  1/19 CT Chest >> thin membranous appearing structure in proximal trachea beneath the vocal cords of uncertain etiology, mild CHF, multiple nodules throughout lungs bilaterally L>R, no new large cavitary lesions  SIGNIFICANT EVENTS: 1/18 Admit with weight loss, fever 103, weakness & persistent cough 1/19 PCCM consulted for evaluation  1/20 Decompensated with stridor, to ICU for emergent intubation > large black fibrinous mass removed from trachea 1/22 Extubated   Antibiotics: Vanco, start date 1/18, day 5/5 (Stopped) Zosyn, start date 1/18, day 5/5 (Stopped) Vancomycin restarted 1/23-1/25  HOSPITAL COURSE: Acute Respiratory Failure with Hypoxemia fever > requiring intubation for stridor on an emergent basis, status post extubation 1/22, now improved, baseline order requirements around 2 L Receiving oxygen via face tent , at 5-6 L of humidified oxygen Respiratory failure secondary to fibrinous mass (fungal?)   Stridor> still occuring 1/23 am, seems to be associated with secretion management and also anxiety. 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 He recommends to maximize humidity, Face shield with humidified oxygen, patient requiring 5-6 L of high flow oxygen to maintain airway humidification Continue Xopenex when necessary Continue Advair , Fungal culture, AFB, negative bronchoalveolar lavage culture shows MRSA Patient received about extremes of IV vancomycin, infectious disease consultation obtained and Dr. Megan Salon recommends Discontinue vancomycin and observe off of antibiotics     Sinus tachycardia resolved Continue gentle hydration  GI prophylaxis-switch Protonix to by mouth    Normocytic Anemia ,Hx PE on chronic anticoagulation FOBT negative Requested hematology consultation, patient seen by Volanda Napoleon, MD Anemia multifactorial in the setting of  infection, malnutrition, dilution, antibiotics. She is also on Methotrexate.  Counts should improve as she is off of antibiotics at this time Hemoccult is negative Status post transfusion of 2 units of packed red blood cells, ordered by oncology Hemoglobin 10.7 Her labs are very consistent with iron deficiency. Her ferritin is only 34. Her iron saturation is only 10% Hematology recommends transfusion to maintain hemoglobin greater than 8 Continue folic acid, patient received a dose of dose of Feraheme at 1020 mg Follow-up with hematology in 3 weeks   spect fungal sinusitis  Staph species 1/2 BC > likely contaminant Doubt HCAP BCx2 1/18 >> few coag neg staph species 1/2  UC 1/19 >> neg BAL 1/20 >> few GPC in clusters, MRSA AFB (bronch) 1/20 >>  Fungal (bronch) 1/20 >>  Cytology (fibrinous mass) 1/20  >>  Culture (finbrinous mass) 1/20 >> MRSA Fungal (fibrinous mass) 1/20 >> Antibiotics administered Vanco, start date 1/18, day 5/5 (Stopped) Zosyn, start date 1/18, day 5/5 (Stopped) Vancomycin restarted 1/23-1/25 no further antibiotics indicated    Steroid-induced hyperglycemia Improving and should resolve once the patient is off steroids Hemoglobin A1c 5.8   Generalized weakness Patient would need SNF Discharge to friend's home tomorrow    Discharge Exam:   Blood pressure 168/81, pulse 77, temperature 97.7 F (36.5 C), temperature source Axillary, resp. rate 18, height 5\' 2"  (1.575 m), weight 65.4 kg (144 lb 2.9 oz), SpO2 96 %.   General: Comfortable in no acute distress  Cardiovascular: RRR, nl S1 s2   Respiratory: Decreased breath sounds at the bases, scattered rhonchi, no crackles   Abdomen: soft +BS NT/ND, no masses palpable   Extremities: No cyanosis and no edema       Discharge Instructions    Diet - low sodium heart healthy    Complete by:  As directed      Increase activity slowly    Complete by:  As directed            Follow-up Information    Follow up with PARRETT,TAMMY, NP On 08/03/2014.   Specialty:  Nurse Practitioner   Why:  Appt at 4:15   Contact information:   [address] [place] [address] [phone]       Follow up with Kathee Delton, MD On 09/16/2014.   Specialty:  Pulmonary Disease   Why:  Appt at 11:30    Contact information:   [address] [place] [address] [phone]       Follow up with  [name], MD. Schedule an appointment as soon as possible for a visit in 1 week.   Specialty:  Family Medicine   Contact information:   [address] [place] [address] [phone]       Signed: Reyne Dumas 07/30/2014, 1:42 PM

## 2014-07-30 NOTE — Progress Notes (Signed)
[facility] for Warfarin Indication: Hx PE  Allergies  Allergen Reactions  . Adhesive [Tape] Rash    Patient Measurements: Height: 5\' 2"  (157.5 cm) Weight: 144 lb 2.9 oz (65.4 kg) IBW/kg (Calculated) : 50.1  Vital Signs: Temp: 97.7 F (36.5 C) (01/28 0506) Temp Source: Axillary (01/28 0506) BP: 168/81 mmHg (01/28 0506) Pulse Rate: 77 (01/28 0506)  Labs:  Recent Labs  07/28/14 0545 07/29/14 0545 07/30/14 0535  HGB 7.6* 7.5* 10.7*  HCT 25.2* 24.5* 33.3*  PLT 360 358 296  LABPROT 24.4* 22.5* 22.4*  INR 2.18* 1.96* 1.95*  CREATININE 0.63  --   --     Estimated Creatinine Clearance: 50.6 mL/min (by C-G formula based on Cr of 0.63).  Assessment: 22 yoF presents to [facility] with SOB, fever, and worsening productive cough x 1 month. Note PMHx of PE on chronic warfarin anticoagulation.  Pharmacy consulted to resume warfarin inpatient.   Home dose warfarin: 2.5mg  daily except 3.75mg  on M/W/F.  Significant events 1/20 warfarin dose not given (pt was intubated and NPO, but did have OG tube access.)  1/24 LUE venous duplex w/ superficial thrombus in left cephalic vein.  Negative for DVT. 1/27 Feraheme 1020mg  IV once, transfuse 2 units PRBC  Today, 07/30/2014  INR 1.95, slightly subtherapeutic  CBC: Hgb improved to 10.7, Plt remain WNL  FOBT negative 1/19   No bleeding or complications reported  S/p transfusion of 2 units PRBC and Feraheme (1/27)   Diet: Regular  Drug interactions: abx/steroids may reduce warfarin needs  Goal of Therapy:  INR 2-3 Monitor platelets by anticoagulation protocol: Yes   Plan:   Warfarin 4 mg PO x1 today at 1200, prior to discharge.  Daily PT/INR, CBC  Monitor for bleeding  For discharge, recommend resuming home warfarin 2.5mg  daily except 3.75mg  on M/W/F with INR recheck on Monday, 2/1.  Gretta Arab PharmD, BCPS Pager [phone] 07/30/2014 10:04 AM

## 2014-07-30 NOTE — Progress Notes (Signed)
PT is not utilizing AFT at this time- is on RA.

## 2014-07-30 NOTE — Progress Notes (Addendum)
Patient is set to discharge to [facility] today. Patient & daughter, [name] aware. Discharge packet given to RN, Susie. [facility] called for transport.     Raynaldo Opitz, [facility] Clinical Social Worker cell #: [phone]

## 2014-07-30 NOTE — Progress Notes (Signed)
Physical Therapy Treatment Patient Details Name: [name] MRN: [id number] DOB: April 01, 1933 Today's Date: Aug 20, 2014    History of Present Illness 81 yo female admitted 07/20/14 with worsening SOB/HCAP/MRSA trachea aspirated.    PT Comments    Pt sitting on BSC upon entering room and supervision for pericare.  Pt then ambulated in hallway.  SpO2 monitored (see below).  Pt requiring cues for safety and likely d/c to SNF today.  Also reinforced pursed lip breathing during activity and rest breaks as needed.   Follow Up Recommendations  SNF;Supervision/Assistance - 24 hour     Equipment Recommendations  Rolling walker with 5" wheels    Recommendations for Other Services       Precautions / Restrictions Precautions Precautions: Fall Precaution Comments: check sats Restrictions Weight Bearing Restrictions: No    Mobility  Bed Mobility               General bed mobility comments: sitting on BSC upon entering room  Transfers Overall transfer level: Needs assistance Equipment used: Rolling walker (2 wheeled) Transfers: Sit to/from Stand Sit to Stand: Min guard         General transfer comment: verbal cues for safe technique  Ambulation/Gait Ambulation/Gait assistance: Min guard Ambulation Distance (Feet): 160 Feet Assistive device: Rolling walker (2 wheeled) Gait Pattern/deviations: Step-through pattern;Trunk flexed     General Gait Details: verbal cues for safe use of RW, SpO2 92-95% on room air during ambulation   Stairs            Wheelchair Mobility    Modified Rankin (Stroke Patients Only)       Balance                                    Cognition Arousal/Alertness: Awake/alert Behavior During Therapy: WFL for tasks assessed/performed Overall Cognitive Status: Within Functional Limits for tasks assessed                      Exercises      General Comments        Pertinent Vitals/Pain Pain Assessment:  No/denies pain  SpO2 room air at rest 98% SpO2 92-95% room air during gait    Home Living                      Prior Function            PT Goals (current goals can now be found in the care plan section) Progress towards PT goals: Progressing toward goals    Frequency  Min 3X/week    PT Plan Current plan remains appropriate    Co-evaluation             End of Session   Activity Tolerance: Patient tolerated treatment well Patient left: in bed;with call bell/phone within reach     Time: 0940-1008 PT Time Calculation (min) (ACUTE ONLY): 28 min  Charges:  $Gait Training: 23-37 mins                    G Codes:      Kyesha Balla,KATHrine E 2014/08/20, 1:18 PM Carmelia Bake, PT, DPT 2014-08-20 Pager: [phone]

## 2014-08-01 LAB — CBC AND DIFFERENTIAL
HCT: 38 % (ref 36–46)
HEMOGLOBIN: 1.7 g/dL — AB (ref 12.0–16.0)
Platelets: 319 10*3/uL (ref 150–399)
WBC: 11.5 10*3/mL

## 2014-08-01 LAB — BASIC METABOLIC PANEL
BUN: 13 mg/dL (ref 4–21)
CREATININE: 0.6 mg/dL (ref 0.5–1.1)
Glucose: 91 mg/dL
Potassium: 3.8 mmol/L (ref 3.4–5.3)
Sodium: 144 mmol/L (ref 137–147)

## 2014-08-01 LAB — HEPATIC FUNCTION PANEL
ALK PHOS: 90 U/L (ref 25–125)
ALT: 31 U/L (ref 7–35)
AST: 22 U/L (ref 13–35)
Bilirubin, Total: 0.6 mg/dL

## 2014-08-03 ENCOUNTER — Inpatient Hospital Stay: Payer: Medicare Other | Admitting: Adult Health

## 2014-08-03 ENCOUNTER — Non-Acute Institutional Stay (SKILLED_NURSING_FACILITY): Payer: Medicare Other | Admitting: Nurse Practitioner

## 2014-08-03 ENCOUNTER — Encounter: Payer: Self-pay | Admitting: Nurse Practitioner

## 2014-08-03 DIAGNOSIS — J329 Chronic sinusitis, unspecified: Secondary | ICD-10-CM

## 2014-08-03 DIAGNOSIS — K219 Gastro-esophageal reflux disease without esophagitis: Secondary | ICD-10-CM

## 2014-08-03 DIAGNOSIS — M3130 Wegener's granulomatosis without renal involvement: Secondary | ICD-10-CM

## 2014-08-03 DIAGNOSIS — Z86711 Personal history of pulmonary embolism: Secondary | ICD-10-CM

## 2014-08-03 DIAGNOSIS — J9601 Acute respiratory failure with hypoxia: Secondary | ICD-10-CM

## 2014-08-03 DIAGNOSIS — D638 Anemia in other chronic diseases classified elsewhere: Secondary | ICD-10-CM | POA: Insufficient documentation

## 2014-08-03 DIAGNOSIS — J4520 Mild intermittent asthma, uncomplicated: Secondary | ICD-10-CM

## 2014-08-03 DIAGNOSIS — R0602 Shortness of breath: Secondary | ICD-10-CM

## 2014-08-03 DIAGNOSIS — E43 Unspecified severe protein-calorie malnutrition: Secondary | ICD-10-CM

## 2014-08-03 DIAGNOSIS — J189 Pneumonia, unspecified organism: Secondary | ICD-10-CM

## 2014-08-03 DIAGNOSIS — K5900 Constipation, unspecified: Secondary | ICD-10-CM

## 2014-08-03 NOTE — Assessment & Plan Note (Signed)
requiring intubation for stridor on an emergent basis, status post extubation 1/22, now improved, baseline order requirements around 2 L Receiving oxygen via face tent , at 5-6 L of humidified oxygen Respiratory failure secondary to fibrinous mass (fungal?)

## 2014-08-03 NOTE — Assessment & Plan Note (Signed)
08/01/14 albumin 2.8-dietary consult.

## 2014-08-03 NOTE — Assessment & Plan Note (Addendum)
08/01/14 Hgb 11.7 Normocytic Anemia ,Hx PE on chronic anticoagulation FOBT negative Requested hematology consultation, patient seen by Volanda Napoleon, MD Anemia multifactorial in the setting of infection, malnutrition, dilution, antibiotics. She is also on Methotrexate.  Counts should improve as she is off of antibiotics at this time Hemoccult is negative Status post transfusion of 2 units of packed red blood cells, ordered by oncology Hemoglobin 10.7 Her labs are very consistent with iron deficiency. Her ferritin is only 34. Her iron saturation is only 10% Hematology recommends transfusion to maintain hemoglobin greater than 8 Continue folic acid, patient received a dose of dose of Feraheme at 1020 mg Follow-up with hematology in 3 weeks

## 2014-08-03 NOTE — Assessment & Plan Note (Signed)
Continue PPI ?

## 2014-08-03 NOTE — Assessment & Plan Note (Signed)
Chronic anticoagulation with Coumadin.

## 2014-08-03 NOTE — Assessment & Plan Note (Signed)
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 He recommends to maximize humidity, Face shield with humidified oxygen, patient requiring 5-6 L of high flow oxygen to maintain airway humidification Continue Xopenex when necessary Continue Advair , Fungal culture, AFB, negative bronchoalveolar lavage culture shows MRSA Patient received about extremes of IV vancomycin, infectious disease consultation obtained and Dr. Megan Salon recommends Discontinue vancomycin and observe off of antibiotics

## 2014-08-03 NOTE — Assessment & Plan Note (Signed)
Continue with Furosemide 20mg 

## 2014-08-03 NOTE — Assessment & Plan Note (Signed)
Continue Advair 

## 2014-08-03 NOTE — Assessment & Plan Note (Signed)
Stable,  takes MiraLax daily.  

## 2014-08-03 NOTE — Progress Notes (Signed)
Patient ID: [name], female   DOB: 1933-03-27, 81 y.o.   MRN: [id number]   Code Status: DNR  Allergies  Allergen Reactions  . Adhesive [Tape] Rash    Chief Complaint  Patient presents with  . Medical Management of Chronic Issues  . Hospitalization Follow-up    HPI: Patient is a 81 y.o. female seen in the SNF at [facility] today for evaluation of f/u hospitalization and other chronic medical conditions.    Hospitalized 07/20/14-07/30/14 for SOB, fever 103, history of Wegener's granulomatosis. She fell ill Christmas Eve, so her PCP office and he placed her on levofloxacin for 7 days for suspected pneumonia. Patient was subsequently seen at urgent care on 1/8 for weakness and persistent cough rx sx . In the ED the patient was found to have a normal white count of 5.4. Chest x-ray shows emphysema with increased interstitial markings. 1/20 Decompensated with stridor, to ICU for emergent intubation -large black fibrinous mass removed from trachea. 1/22 Extubated. ABXSuzie Portela, start date 1/18, day 5/5 (Stopped)Zosyn, start date 1/18, day 5/5 (Stopped) Vancomycin restarted 1/23-1/25   Problem List Items Addressed This Visit    WEGENERS GRANULOMATOSIS    Granulomatosis polyangitis but no clear evidence of active lung involvement 1/21, status post CT scan of the chest  Asthma, not in exacerbation Currently on prednisone 40 mg by mouth daily, taper prednisone slowly over the course of the next 2 weeks ENT eval > no evidence to support fungal sinus disease. He recommends to maximize humidity, Face shield with humidified oxygen, patient requiring 5-6 L of high flow oxygen to maintain airway humidification Continue Xopenex when necessary Continue Advair , Fungal culture, AFB, negative bronchoalveolar lavage culture shows MRSA Patient received about extremes of IV vancomycin, infectious disease consultation obtained and Dr. Megan Salon recommends Discontinue vancomycin and observe off  of antibiotics         Sinusitis, chronic    spect fungal sinusitis  Staph species 1/2 BC > likely contaminant Doubt HCAP BCx2 1/18 >> few coag neg staph species 1/2  UC 1/19 >> neg BAL 1/20 >> few GPC in clusters, MRSA AFB (bronch) 1/20 >>  Fungal (bronch) 1/20 >>  Cytology (fibrinous mass) 1/20 >>  Culture (finbrinous mass) 1/20 >> MRSA Fungal (fibrinous mass) 1/20 >>       Shortness of breath    Continue with Furosemide 20mg       Protein-calorie malnutrition, severe - Primary    08/01/14 albumin 2.8-dietary consult.        Personal history of PE (pulmonary embolism)    Chronic anticoagulation with Coumadin.       Intrinsic asthma    Continue Advair.       HCAP (healthcare-associated pneumonia)    Complete Prednisone taper dose at SNF      GERD (gastroesophageal reflux disease)    Continue PPI      Constipation    Stable, takes MiraLax daily.       Anemia of chronic disease    08/01/14 Hgb 11.7 Normocytic Anemia ,Hx PE on chronic anticoagulation FOBT negative Requested hematology consultation, patient seen by Volanda Napoleon, MD Anemia multifactorial in the setting of infection, malnutrition, dilution, antibiotics. She is also on Methotrexate.  Counts should improve as she is off of antibiotics at this time Hemoccult is negative Status post transfusion of 2 units of packed red blood cells, ordered by oncology Hemoglobin 10.7 Her labs are very consistent with iron deficiency. Her ferritin is only 34. Her  iron saturation is only 10% Hematology recommends transfusion to maintain hemoglobin greater than 8 Continue folic acid, patient received a dose of dose of Feraheme at 1020 mg Follow-up with hematology in 3 weeks        Acute respiratory failure with hypoxia    requiring intubation for stridor on an emergent basis, status post extubation 1/22, now improved, baseline order requirements around 2 L Receiving oxygen via face tent , at 5-6 L  of humidified oxygen Respiratory failure secondary to fibrinous mass (fungal?)          Review of Systems:  Review of Systems  Constitutional: Negative for fever, chills, weight loss, malaise/fatigue and diaphoresis.  HENT: Positive for hearing loss. Negative for congestion, ear discharge, ear pain, nosebleeds, sore throat and tinnitus.   Eyes: Negative for blurred vision, double vision, photophobia, pain, discharge and redness.  Respiratory: Positive for cough. Negative for hemoptysis, sputum production, shortness of breath, wheezing and stridor.   Cardiovascular: Positive for leg swelling. Negative for chest pain, palpitations, orthopnea, claudication and PND.  Gastrointestinal: Positive for constipation. Negative for heartburn, nausea, vomiting, abdominal pain, diarrhea, blood in stool and melena.  Genitourinary: Positive for frequency. Negative for dysuria, hematuria and flank pain.  Musculoskeletal: Positive for back pain and joint pain. Negative for myalgias, falls and neck pain.  Skin: Negative for itching and rash.  Neurological: Negative for dizziness, tingling, tremors, sensory change, speech change, focal weakness, seizures, loss of consciousness, weakness and headaches.  Endo/Heme/Allergies: Negative for environmental allergies and polydipsia. Does not bruise/bleed easily.  Psychiatric/Behavioral: Negative for depression, suicidal ideas, hallucinations, memory loss and substance abuse. The patient is not nervous/anxious and does not have insomnia.      Past Medical History  Diagnosis Date  . Wegener's granulomatosis   . OA (osteoarthritis)   . Blindness of one eye   . Hearing loss in left ear     hearing aid both ears  . Bladder incontinence   . Glaucoma   . Clotting disorder   . History of pulmonary embolism 1997  . Shortness of breath dyspnea   . Pneumonia 12/15  . Multiple allergies   . Chronic sinusitis    Past Surgical History  Procedure Laterality Date  .  Tubal ligation    . Pubovaginal sling    . Cataract extraction    . Melanoma excision      left leg  . Vena cava filter placement  1997  . Knee arthroscopy  2006    rt   . Inguinal hernia repair Left 02/17/2014    Procedure: LEFT INGUINAL HERNIA REPAIR WITH MESH;  Surgeon: Harl Bowie, MD;  Location: [facility];  Service: General;  Laterality: Left;  . Insertion of mesh N/A 02/17/2014    Procedure: INSERTION OF MESH;  Surgeon: Harl Bowie, MD;  Location: [place];  Service: General;  Laterality: N/A;   Social History:   reports that she quit smoking about 30 years ago. Her smoking use included Cigarettes. She has a 45 pack-year smoking history. She does not have any smokeless tobacco history on file. She reports that she drinks alcohol. She reports that she does not use illicit drugs.  Family History  Problem Relation Age of Onset  . Heart disease Father     stroke-deceased  . Stroke Father   . Congestive Heart Failure Mother     deceased  . Heart disease Mother   . Uterine cancer Sister   . Lymphoma  Sister   . Skin cancer Sister     no melanoma    Medications: Patient's Medications  New Prescriptions   No medications on file  Previous Medications   ACETAMINOPHEN (TYLENOL) 325 MG TABLET    Take 2 tablets (650 mg total) by mouth every 6 (six) hours as needed for mild pain (or Fever >/= 101).   ADVAIR DISKUS 500-50 MCG/DOSE AEPB    INHALE 1 PUFF INTO LUNGS EVERY 12 HOURS   B COMPLEX VITAMINS CAPSULE    Take 1 capsule by mouth daily.   CALCIUM-VITAMIN D (OSCAL WITH D) 500-200 MG-UNIT PER TABLET    Take 1 tablet by mouth daily with breakfast.   CHOLECALCIFEROL (VITAMIN D PO)    Take 1 tablet by mouth daily.   FOLIC ACID (FOLVITE) 1 MG TABLET    Take 1 tablet (1 mg total) by mouth daily.   FUROSEMIDE (LASIX) 20 MG TABLET    Take 20 mg by mouth daily.   LATANOPROST (XALATAN) 0.005 % OPHTHALMIC SOLUTION    Place 1 drop into both eyes at  bedtime.   LEVALBUTEROL (XOPENEX) 0.63 MG/3ML NEBULIZER SOLUTION    Take 3 mLs (0.63 mg total) by nebulization every 6 (six) hours as needed for wheezing or shortness of breath.   LORAZEPAM (ATIVAN) 0.5 MG TABLET    Take 1 tablet (0.5 mg total) by mouth every 6 (six) hours as needed for anxiety.   MELATONIN PO    Take 1-3 tablets by mouth at bedtime as needed (for sleep).    MULTIPLE VITAMINS-MINERALS (MULTIVITAMIN WITH MINERALS) TABLET    Take 1 tablet by mouth daily.   PANTOPRAZOLE (PROTONIX) 40 MG TABLET    Take 1 tablet (40 mg total) by mouth 2 (two) times daily.   POLYETHYLENE GLYCOL (MIRALAX / GLYCOLAX) PACKET    Take 17 g by mouth daily.   PREDNISONE (DELTASONE) 5 MG TABLET    8 tablets for 5 days 7 tablets for 5 days 6 tablets for 5 days 5 tablets for 5 days 4 tablets for 5 days 3 tablets for 5 days 2 tablets for 5 days 1 tablet for 5 days DC   TIMOLOL (BETIMOL) 0.5 % OPHTHALMIC SOLUTION    Place 1 drop into the left eye daily with breakfast.    WARFARIN (COUMADIN) 2.5 MG TABLET    Take 2.5-3.75 mg by mouth See admin instructions. Takes 2.5mg  everyday except 3.75 on Monday, Wednesday, and Friday   ZOLEDRONIC ACID (RECLAST) 5 MG/100ML SOLN    Inject 5 mg into the vein once. yearly  Modified Medications   No medications on file  Discontinued Medications   No medications on file     Physical Exam: Physical Exam  Constitutional: She is oriented to person, place, and time. She appears well-developed and well-nourished. No distress.  HENT:  Head: Normocephalic and atraumatic.  Right Ear: External ear normal.  Left Ear: External ear normal.  Nose: Nose normal.  Mouth/Throat: Oropharynx is clear and moist. No oropharyngeal exudate.  Eyes: Conjunctivae and EOM are normal. Pupils are equal, round, and reactive to light. Right eye exhibits no discharge. Left eye exhibits no discharge. No scleral icterus.  Neck: Normal range of motion. Neck supple. No JVD present. No tracheal  deviation present. No thyromegaly present.  Cardiovascular: Normal rate, regular rhythm, normal heart sounds and intact distal pulses.   No murmur heard. Pulmonary/Chest: Effort normal and breath sounds normal. No stridor. No respiratory distress. She has no wheezes. She has no  rales. She exhibits no tenderness.  Abdominal: Soft. Bowel sounds are normal. She exhibits no distension and no mass. There is no tenderness. There is no rebound and no guarding.  Musculoskeletal: Normal range of motion. She exhibits edema. She exhibits no tenderness.  trace  Lymphadenopathy:    She has no cervical adenopathy.  Neurological: She is alert and oriented to person, place, and time. She has normal reflexes. She displays normal reflexes. No cranial nerve deficit. She exhibits normal muscle tone. Coordination normal.  Skin: Skin is warm and dry. No rash noted. She is not diaphoretic. No erythema. No pallor.  Psychiatric: She has a normal mood and affect. Her behavior is normal. Judgment and thought content normal.    Filed Vitals:   08/03/14 1321  BP: 133/78  Pulse: 80  Temp: 98 F (36.7 C)  TempSrc: Tympanic  Resp: 18      Labs reviewed: Basic Metabolic Panel:  Recent Labs  07/20/14 1713  07/24/14 0420 07/26/14 0530 07/27/14 0530 07/28/14 0545 08/01/14  NA  --   < > 142 140  --  141 144  K  --   < > 4.4 4.3  --  3.6 3.8  CL  --   < > 114* 112  --  108  --   CO2  --   < > 23 24  --  28  --   GLUCOSE  --   < > 159* 146*  --  97  --   BUN  --   < > 29* 28*  --  23 13  CREATININE  --   < > 0.82 0.61 0.68 0.63 0.6  CALCIUM  --   < > 9.0 8.5  --  8.3*  --   MG 2.0  --   --   --   --  2.1  --   < > = values in this interval not displayed. Liver Function Tests:  Recent Labs  07/23/14 0400 07/26/14 0530 07/28/14 0545 08/01/14  AST 36 36 23 22  ALT 23 43* 39* 31  ALKPHOS 97 90 82 90  BILITOT 0.4 0.5 0.6  --   PROT 5.0* 5.0* 4.6*  --   ALBUMIN 2.3* 2.3* 2.2*  --    No results for  input(s): LIPASE, AMYLASE in the last 8760 hours. No results for input(s): AMMONIA in the last 8760 hours. CBC:  Recent Labs  07/10/14 1201  07/24/14 0420  07/28/14 0545 07/29/14 0545 07/30/14 0535 08/01/14  WBC 5.1  < > 8.8  < > 10.6* 9.4 9.0 11.5  NEUTROABS 3.4  --  8.1*  --   --   --   --   --   HGB 9.0*  < > 8.1*  < > 7.6* 7.5* 10.7* 1.7*  HCT 29.6*  < > 26.8*  < > 25.2* 24.5* 33.3* 38  MCV 79.8  < > 80.0  < > 77.3* 78.3 80.6  --   PLT 445*  < > 421*  < > 360 358 296 319  < > = values in this interval not displayed. Lipid Panel: No results for input(s): CHOL, HDL, LDLCALC, TRIG, CHOLHDL, LDLDIRECT in the last 8760 hours.  Past Procedures:  06/13/15 CT temporal bones:   IMPRESSION: BILATERAL middle ear and mastoid cholesteatomas are suspected. These changes are chronic and worse on the LEFT.  07/21/14 CT chest with contrast:   IMPRESSION: 1. There is a thin membranous appearing structure in the proximal trachea immediately beneath  the level the vocal cords, which is of uncertain etiology and significance, but may account for the patient's upper airway wheezing on physical examination. Consideration for direct inspection with bronchoscopy is recommended if clinically appropriate. 2. The appearance of the chest suggests mild congestive heart failure, as above. 3. Multiple nodules throughout the lungs bilaterally (left greater than right), similar in appearance to numerous prior examinations, presumably sequela of underlying Wegner's granulomatosis. No new large cavitary lesion to suggest active parenchymal involvement at this time. 4. Mild cardiomegaly.  07/22/14 CT maxillofacial   IMPRESSION: 1. Wegner's granulomatosis with pansinusitis and otomastoiditis described above. 2. Concerning the clinical question, there may be sinus fungal colonization but there is no visible mycetoma or invasive sinusitis.     Assessment/Plan Protein-calorie malnutrition,  severe 08/01/14 albumin 2.8-dietary consult.     Anemia of chronic disease 08/01/14 Hgb 11.7 Normocytic Anemia ,Hx PE on chronic anticoagulation FOBT negative Requested hematology consultation, patient seen by Volanda Napoleon, MD Anemia multifactorial in the setting of infection, malnutrition, dilution, antibiotics. She is also on Methotrexate.  Counts should improve as she is off of antibiotics at this time Hemoccult is negative Status post transfusion of 2 units of packed red blood cells, ordered by oncology Hemoglobin 10.7 Her labs are very consistent with iron deficiency. Her ferritin is only 34. Her iron saturation is only 10% Hematology recommends transfusion to maintain hemoglobin greater than 8 Continue folic acid, patient received a dose of dose of Feraheme at 1020 mg Follow-up with hematology in 3 weeks     Intrinsic asthma Continue Advair.    Shortness of breath Continue with Furosemide 20mg    GERD (gastroesophageal reflux disease) Continue PPI   Constipation Stable, takes MiraLax daily.    HCAP (healthcare-associated pneumonia) Complete Prednisone taper dose at SNF   Personal history of PE (pulmonary embolism) Chronic anticoagulation with Coumadin.    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 He  recommends to maximize humidity, Face shield with humidified oxygen, patient requiring 5-6 L of high flow oxygen to maintain airway humidification Continue Xopenex when necessary Continue Advair , Fungal culture, AFB, negative bronchoalveolar lavage culture shows MRSA Patient received about extremes of IV vancomycin, infectious disease consultation obtained and Dr. Megan Salon recommends Discontinue vancomycin and observe off of antibiotics      Acute respiratory failure with hypoxia requiring intubation for stridor on an emergent basis, status post extubation 1/22, now improved, baseline order requirements around 2 L Receiving oxygen via face tent , at 5-6 L of humidified oxygen Respiratory failure secondary to fibrinous mass (fungal?)      Family/ Staff Communication: observe the patient  Goals of Care: IL  Labs/tests ordered: CBC and CMP done 08/01/14

## 2014-08-03 NOTE — Assessment & Plan Note (Signed)
Complete Prednisone taper dose at [facility]

## 2014-08-03 NOTE — Assessment & Plan Note (Signed)
spect fungal sinusitis  Staph species 1/2 BC > likely contaminant Doubt HCAP BCx2 1/18 >> few coag neg staph species 1/2  UC 1/19 >> neg BAL 1/20 >> few GPC in clusters, MRSA AFB (bronch) 1/20 >>  Fungal (bronch) 1/20 >>  Cytology (fibrinous mass) 1/20 >>  Culture (finbrinous mass) 1/20 >> MRSA Fungal (fibrinous mass) 1/20 >>

## 2014-08-06 ENCOUNTER — Telehealth: Payer: Self-pay | Admitting: *Deleted

## 2014-08-06 NOTE — Telephone Encounter (Signed)
Please advise KC thanks 

## 2014-08-06 NOTE — Telephone Encounter (Signed)
Rep from [facility] call report pt positive acid fast bacilli isolated identification to follow. Information given to Dr. Lake Bells verbally. This is Dr. Janifer Adie patient therefore result will be forwarded to [facility] via fax.

## 2014-08-07 ENCOUNTER — Telehealth: Payer: Self-pay | Admitting: Hematology & Oncology

## 2014-08-07 NOTE — Telephone Encounter (Signed)
Pt is in a skilled nursing home facility. Not able  to remind them of their appointment with Dr. Marin Olp. Also, advised them to bring all medication bottles and insurance card information.   P: [phone]

## 2014-08-10 ENCOUNTER — Encounter: Payer: Self-pay | Admitting: Hematology & Oncology

## 2014-08-10 ENCOUNTER — Non-Acute Institutional Stay (SKILLED_NURSING_FACILITY): Payer: Medicare Other | Admitting: Internal Medicine

## 2014-08-10 ENCOUNTER — Ambulatory Visit (HOSPITAL_BASED_OUTPATIENT_CLINIC_OR_DEPARTMENT_OTHER): Payer: Medicare Other | Admitting: Hematology & Oncology

## 2014-08-10 ENCOUNTER — Other Ambulatory Visit: Payer: Self-pay | Admitting: Internal Medicine

## 2014-08-10 ENCOUNTER — Encounter: Payer: Self-pay | Admitting: Internal Medicine

## 2014-08-10 ENCOUNTER — Ambulatory Visit: Payer: Medicare Other

## 2014-08-10 ENCOUNTER — Other Ambulatory Visit (HOSPITAL_BASED_OUTPATIENT_CLINIC_OR_DEPARTMENT_OTHER): Payer: Medicare Other | Admitting: Lab

## 2014-08-10 VITALS — BP 110/56 | HR 80 | Temp 98.1°F | Resp 16 | Ht 61.0 in | Wt 120.0 lb

## 2014-08-10 DIAGNOSIS — J9601 Acute respiratory failure with hypoxia: Secondary | ICD-10-CM

## 2014-08-10 DIAGNOSIS — H9190 Unspecified hearing loss, unspecified ear: Secondary | ICD-10-CM | POA: Insufficient documentation

## 2014-08-10 DIAGNOSIS — J4520 Mild intermittent asthma, uncomplicated: Secondary | ICD-10-CM

## 2014-08-10 DIAGNOSIS — K4090 Unilateral inguinal hernia, without obstruction or gangrene, not specified as recurrent: Secondary | ICD-10-CM

## 2014-08-10 DIAGNOSIS — D500 Iron deficiency anemia secondary to blood loss (chronic): Secondary | ICD-10-CM

## 2014-08-10 DIAGNOSIS — D509 Iron deficiency anemia, unspecified: Secondary | ICD-10-CM

## 2014-08-10 DIAGNOSIS — E43 Unspecified severe protein-calorie malnutrition: Secondary | ICD-10-CM

## 2014-08-10 DIAGNOSIS — R490 Dysphonia: Secondary | ICD-10-CM

## 2014-08-10 DIAGNOSIS — I517 Cardiomegaly: Secondary | ICD-10-CM | POA: Insufficient documentation

## 2014-08-10 DIAGNOSIS — R9389 Abnormal findings on diagnostic imaging of other specified body structures: Secondary | ICD-10-CM

## 2014-08-10 DIAGNOSIS — R531 Weakness: Secondary | ICD-10-CM | POA: Insufficient documentation

## 2014-08-10 DIAGNOSIS — R32 Unspecified urinary incontinence: Secondary | ICD-10-CM | POA: Insufficient documentation

## 2014-08-10 DIAGNOSIS — E559 Vitamin D deficiency, unspecified: Secondary | ICD-10-CM | POA: Insufficient documentation

## 2014-08-10 DIAGNOSIS — Z8619 Personal history of other infectious and parasitic diseases: Secondary | ICD-10-CM

## 2014-08-10 DIAGNOSIS — R269 Unspecified abnormalities of gait and mobility: Secondary | ICD-10-CM | POA: Insufficient documentation

## 2014-08-10 DIAGNOSIS — J189 Pneumonia, unspecified organism: Secondary | ICD-10-CM

## 2014-08-10 DIAGNOSIS — Z7901 Long term (current) use of anticoagulants: Secondary | ICD-10-CM

## 2014-08-10 DIAGNOSIS — R609 Edema, unspecified: Secondary | ICD-10-CM

## 2014-08-10 DIAGNOSIS — R938 Abnormal findings on diagnostic imaging of other specified body structures: Secondary | ICD-10-CM

## 2014-08-10 DIAGNOSIS — M3130 Wegener's granulomatosis without renal involvement: Secondary | ICD-10-CM

## 2014-08-10 DIAGNOSIS — H9193 Unspecified hearing loss, bilateral: Secondary | ICD-10-CM

## 2014-08-10 DIAGNOSIS — N3946 Mixed incontinence: Secondary | ICD-10-CM

## 2014-08-10 DIAGNOSIS — M4120 Other idiopathic scoliosis, site unspecified: Secondary | ICD-10-CM | POA: Insufficient documentation

## 2014-08-10 DIAGNOSIS — J329 Chronic sinusitis, unspecified: Secondary | ICD-10-CM

## 2014-08-10 DIAGNOSIS — D638 Anemia in other chronic diseases classified elsewhere: Secondary | ICD-10-CM

## 2014-08-10 LAB — CBC WITH DIFFERENTIAL (CANCER CENTER ONLY)
BASO#: 0 10*3/uL (ref 0.0–0.2)
BASO%: 0.2 % (ref 0.0–2.0)
EOS ABS: 0 10*3/uL (ref 0.0–0.5)
EOS%: 0.1 % (ref 0.0–7.0)
HEMATOCRIT: 35.7 % (ref 34.8–46.6)
HEMOGLOBIN: 11.3 g/dL — AB (ref 11.6–15.9)
LYMPH#: 0.3 10*3/uL — ABNORMAL LOW (ref 0.9–3.3)
LYMPH%: 2.6 % — AB (ref 14.0–48.0)
MCH: 27.3 pg (ref 26.0–34.0)
MCHC: 31.7 g/dL — AB (ref 32.0–36.0)
MCV: 86 fL (ref 81–101)
MONO#: 0.2 10*3/uL (ref 0.1–0.9)
MONO%: 1.7 % (ref 0.0–13.0)
NEUT%: 95.4 % — ABNORMAL HIGH (ref 39.6–80.0)
NEUTROS ABS: 9.5 10*3/uL — AB (ref 1.5–6.5)
PLATELETS: 232 10*3/uL (ref 145–400)
RBC: 4.14 10*6/uL (ref 3.70–5.32)
RDW: 28.3 % — AB (ref 11.1–15.7)
WBC: 10 10*3/uL (ref 3.9–10.0)

## 2014-08-10 LAB — RETICULOCYTES (CHCC)
ABS Retic: 86.4 10*3/uL (ref 19.0–186.0)
RBC.: 4.32 MIL/uL (ref 3.87–5.11)
RETIC CT PCT: 2 % (ref 0.4–2.3)

## 2014-08-10 LAB — CHCC SATELLITE - SMEAR

## 2014-08-10 MED ORDER — VITAMIN D (CHOLECALCIFEROL) 25 MCG (1000 UT) PO CAPS: 1000.0000 mg | ORAL_CAPSULE | Freq: Every day | ORAL | Status: DC

## 2014-08-10 MED ORDER — FUROSEMIDE 40 MG PO TABS: ORAL_TABLET | ORAL | Status: DC

## 2014-08-10 MED ORDER — VITAMIN D3 25 MCG (1000 UNIT) PO TABS: ORAL_TABLET | ORAL | Status: DC

## 2014-08-10 NOTE — Progress Notes (Signed)
Hematology and Oncology Follow Up Visit  [name] [id number] Aug 16, 1932 81 y.o. 08/10/2014   Principle Diagnosis:   Recurrent iron deficiency anemia  Wegener's granulomatosis  Current Therapy:    IV iron as indicated     Interim History:  Ms.  [name] is back for follow-up. We've I have not seen her in the office for about for 5 years.  I [facility] She was admitted with pulmonary issues. She had lost some weight. She is on chronic anticoagulation because of a history of pulmonary embolism.  She had lost about 15 pounds.  She was quite anemic. She was obviously iron deficient.  We did give her 2 units of blood and a dose of Feraheme. She got the dose of Feraheme about 10 days ago. Her dose was 1020mg . She now is at the nursing home. She's doing okay. She has leg swelling. She is on Lasix. She urinates quite a bit.  She's not noted any obvious bleeding.  Her appetite seems we don't pretty well. She has no nausea or vomiting.  Currently, her performance status is ECOG 3   Medications:  Current outpatient prescriptions:  .  acetaminophen (TYLENOL) 325 MG tablet, Take 2 tablets (650 mg total) by mouth every 6 (six) hours as needed for mild pain (or Fever >/= 101)., Disp: 30 tablet, Rfl: 2 .  ADVAIR DISKUS 500-50 MCG/DOSE AEPB, INHALE 1 PUFF INTO LUNGS EVERY 12 HOURS, Disp: 60 each, Rfl: 4 .  b complex vitamins capsule, Take 1 capsule by mouth daily., Disp: , Rfl:  .  benzonatate (TESSALON) 200 MG capsule, Take 200 mg by mouth 2 (two) times daily as needed. , Disp: , Rfl: 0 .  calcium-vitamin D (OSCAL WITH D) 500-200 MG-UNIT per tablet, Take 1 tablet by mouth daily with breakfast., Disp: , Rfl:  .  cholecalciferol (VITAMIN D) 1000 UNITS tablet, One daily for vitamin D supplement, Disp: 30 tablet, Rfl: 11 .  fluticasone (FLONASE) 50 MCG/ACT nasal spray, Place 1 spray into both nostrils as needed. , Disp: , Rfl: 5 .  folic acid  (FOLVITE) 1 MG tablet, Take 1 tablet (1 mg total) by mouth daily., Disp: 30 tablet, Rfl: 0 .  furosemide (LASIX) 40 MG tablet, One daily to prevent fluid retention, Disp: 30 tablet, Rfl: 3 .  latanoprost (XALATAN) 0.005 % ophthalmic solution, Place 1 drop into both eyes at bedtime., Disp: , Rfl:  .  levalbuterol (XOPENEX) 0.63 MG/3ML nebulizer solution, Take 3 mLs (0.63 mg total) by nebulization every 6 (six) hours as needed for wheezing or shortness of breath., Disp: 3 mL, Rfl: 12 .  LORazepam (ATIVAN) 0.5 MG tablet, Take 1 tablet (0.5 mg total) by mouth every 6 (six) hours as needed for anxiety., Disp: 30 tablet, Rfl: 0 .  MELATONIN PO, Take 1-3 tablets by mouth at bedtime as needed (for sleep). , Disp: , Rfl:  .  Multiple Vitamins-Minerals (MULTIVITAMIN WITH MINERALS) tablet, Take 1 tablet by mouth daily., Disp: , Rfl:  .  pantoprazole (PROTONIX) 40 MG tablet, Take 1 tablet (40 mg total) by mouth 2 (two) times daily., Disp: 60 tablet, Rfl: 2 .  polyethylene glycol (MIRALAX / GLYCOLAX) packet, Take 17 g by mouth daily., Disp: 14 each, Rfl: 0 .  predniSONE (DELTASONE) 20 MG tablet, Take by mouth daily with breakfast. , Disp: , Rfl:  .  timolol (BETIMOL) 0.5 % ophthalmic solution, Place 1 drop into the left eye daily with  breakfast. , Disp: , Rfl:  .  traZODone (DESYREL) 50 MG tablet, , Disp: , Rfl: 1 .  warfarin (COUMADIN) 2.5 MG tablet, Take 2.5-3.75 mg by mouth See admin instructions. Takes 2.5mg  everyday except 3.75 on Monday, Wednesday, and Friday, Disp: , Rfl:  .  zoledronic acid (RECLAST) 5 MG/100ML SOLN, Inject 5 mg into the vein once. yearly, Disp: , Rfl:  .  NU-IRON 150 MG capsule, Take 150 mg by mouth daily., Disp: , Rfl: 0 .  sulfamethoxazole-trimethoprim (BACTRIM DS,SEPTRA DS) 800-160 MG per tablet, , Disp: , Rfl: 1  Allergies:  Allergies  Allergen Reactions  . Adhesive [Tape] Rash    Past Medical History, Surgical history, Social history, and Family History were reviewed and  updated.  Review of Systems: As above  Physical Exam:  height is 5\' 1"  (1.549 m) and weight is 120 lb (54.432 kg). Her oral temperature is 98.1 F (36.7 C). Her blood pressure is 110/56 and her pulse is 80. Her respiration is 16.   Elderly white female in no obvious distress. Head and neck exam shows no ocular or oral lesions. She has no scleral icterus. She has no adenopathy in the neck. Lungs shows good breath sounds bilaterally. No rales, wheezes or rhonchi are noted. Cardiac exam regular rate and rhythm with a 1/6 systolic ejection murmur. Abdomen is soft. She has good bowel sounds. There is no fluid wave. There is no palpable liver or spleen tip. Extremities shows 2+ edema in her lower legs and feet. She has some age related osteophytic changes in her joints. She has 4/5 strength in her legs. Back exam shows some slight kyphosis. Neurological exam shows no focal deficits.  Lab Results  Component Value Date   WBC 10.0 08/10/2014   HGB 11.3* 08/10/2014   HCT 35.7 08/10/2014   MCV 86 08/10/2014   PLT 232 08/10/2014     Chemistry      Component Value Date/Time   NA 144 08/01/2014   NA 141 07/28/2014 0545   K 3.8 08/01/2014   CL 108 07/28/2014 0545   CO2 28 07/28/2014 0545   BUN 13 08/01/2014   BUN 23 07/28/2014 0545   CREATININE 0.6 08/01/2014   CREATININE 0.63 07/28/2014 0545   GLU 91 08/01/2014      Component Value Date/Time   CALCIUM 8.3* 07/28/2014 0545   ALKPHOS 90 08/01/2014   AST 22 08/01/2014   ALT 31 08/01/2014   BILITOT 0.6 07/28/2014 0545         Impression and Plan: Ms. [name] is 81 year old female. She has responded very well to blood and iron. She got transfused back on general 27th with a hemoglobin of 7.5. The next day, her hemoglobin was 10.7. It is even better today.  Under the microscope, her blood smear looks good. She has a few microcytic cells. There are no rouleau formation. There is no schistocytes or spherocytes. White cells and platelets look  mature. She has an increase in segmented polys. She has a decrease in lymphocytes. This is because of steroid use.  I would be surprised if her iron is on the low side. If it is, then we will give her another dose of iron.  Of note, her erythropoietin level in the hospital was 127. As such, I don't think we have to use ESA.  I'm just happy that her blood counts are improving. It just goes to prove that she needs iron.  I want to get her back in 6 weeks.  I think this would be appropriate.  I spent about 35 minutes with her today.   Volanda Napoleon, MD 2/8/20164:25 PM

## 2014-08-10 NOTE — Progress Notes (Signed)
Patient ID: [name], female   DOB: 31-Jul-1932, 81 y.o.   MRN: [id number]    HISTORY AND PHYSICAL  Location:  [facility] Room Number: 01 Place of Service: SNF [phone])   Extended Emergency Contact Information Primary Emergency Contact: Haygood,Sandra Address: [address] [address] Johnnette Litter of [place] Phone: [phone] Relation: Daughter Secondary Emergency Contact: Cradle,[name] Address: [address] [place]          [place], [place] [address] [place] Phone: [phone] Mobile Phone: [phone] Relation: Son  Advanced Directive information  Does patient have an advance directive?: Yes, Type of Advance Directive: Healthcare Power of [place];Living will, Does patient want to make changes to advanced directive?: No - Patient declined      Chief Complaint  Patient presents with  . New Admit To SNF    following hospitalization    HPI:  81 year old female was admitted to skilled nursing facility at [facility] on 07/30/14. She was hospitalized 07/20/14 through 07/30/14. At the time of admission she was short of breath. Healthcare acquired pneumonia was suspected. Bacterial cultures from bronchial lavage ultimately yielded MRSA. She became quite hypoxic and required intubation. Her bronchial alveolar lavage was done 07/22/14. Cultures yielded MRSA. There was a fibrinous mass that was extracted from the trachea. Fungal cultures are still pending. Infectious disease was consulted. They did not recommend further antibiotics or antifungal agents. Dyspnea improved somewhat, but she remains quite hoarse.  This patient has a long-standing history of Wegener's granulomatosis, with initial diagnosis about 81. She has been on steroids. She has a chronic sinusitis related to this.  Additional diagnoses include emphysema with known bullous lesions particularly in the left upper lobe, asthma, and a  history of aspergillosis. She has had a pulmonary embolus in the past and remains anticoagulated with warfarin.  Recent x-ray suggested a mild cardiomegaly. She has not had an echocardiogram and had no previous diagnosis of congestive heart failure. She says that she has been swelling more in her ankles bilaterally but slightly worse on the left side since 3 months ago.  Patient is generally weak and has a gait abnormality with poor balance. She is in the nursing facility for strengthening, rehabilitation, and safe mobility.  Her primary care physician is Dr. [name]. She was seen on 08/07/14 at his office. She has upcoming appointments with her rheumatologist, Dr. Tobie Lords and with a pulmonologist, [name].  Patient carries a diagnosis of chronic anemia which is normocytic this is presumably an anemia of chronic disease  There have been no signs of bleeding.  Dr. Rex Kras, who saw patient, half of Dr. Harrington Challenger, increased her Lasix to 40 mg daily because of the edema. Patient has previously worn compression stockings, but does not like to put them on. Additional blood work done at his office include BNP, pro time, TSH, CBC. She additionally received a Pneumovax PPV 23 immunization.  Past Medical History  Diagnosis Date  . Wegener's granulomatosis   . OA (osteoarthritis)   . Blindness of one eye   . Hearing loss in left ear     hearing aid both ears  . Bladder incontinence   . Glaucoma   . Clotting disorder   . History of pulmonary embolism 1997  . Shortness of breath dyspnea   . Pneumonia 12/15  . Multiple allergies   . Chronic sinusitis  Past Surgical History  Procedure Laterality Date  . Tubal ligation    . Pubovaginal sling    . Cataract extraction    . Melanoma excision      left leg  . Vena cava filter placement  1997    [place] filter  . Knee arthroscopy  2006    rt   . Inguinal hernia repair Left 02/17/2014    Procedure: LEFT INGUINAL HERNIA REPAIR WITH MESH;   Surgeon: Harl Bowie, MD;  Location: [facility];  Service: General;  Laterality: Left;  . Insertion of mesh N/A 02/17/2014    Procedure: INSERTION OF MESH;  Surgeon: Harl Bowie, MD;  Location: [facility];  Service: General;  Laterality: N/A;    Patient Care Team: [name], MD as PCP - General (Family Medicine) Thyra Breed, MD as Consulting Physician (Rheumatology) Rozetta Nunnery, MD as Consulting Physician (Otolaryngology) Marcial Pacas, MD as Consulting Physician (Neurology) Michel Bickers, MD as Consulting Physician (Infectious Diseases) Volanda Napoleon, MD as Consulting Physician (Oncology) Juanito Doom, MD as Consulting Physician (Pulmonary Disease) Tanda Rockers, MD as Consulting Physician (Pulmonary Disease) Estill Dooms, MD as Consulting Physician (Geriatric Medicine) Coralie Keens, MD as Consulting Physician (General Surgery)  History   Social History  . Marital Status: Married    Spouse Name: N/A    Number of Children: N/A  . Years of Education: N/A   Occupational History  . retired    Social History Main Topics  . Smoking status: Former Smoker -- 1.50 packs/day for 30 years    Types: Cigarettes    Quit date: 07/03/1984  . Smokeless tobacco: Not on file  . Alcohol Use: Yes     Comment: 1-2 glasses  . Drug Use: No  . Sexual Activity: Not on file   Other Topics Concern  . Not on file   Social History Narrative     reports that she quit smoking about 30 years ago. Her smoking use included Cigarettes. She has a 45 pack-year smoking history. She does not have any smokeless tobacco history on file. She reports that she drinks alcohol. She reports that she does not use illicit drugs.  Family History  Problem Relation Age of Onset  . Heart disease Father     stroke-deceased  . Stroke Father   . Congestive Heart Failure Mother     deceased - CHF  . Heart disease Mother   . Uterine cancer Sister    . Lymphoma Sister   . Skin cancer Sister     no melanoma   Family Status  Relation Status Death Age  . Father Deceased     CVA  . Mother Deceased     CHF  . Sister Deceased     Lymphoma  . Sister Alive     Alzheimer's  . Sister Alive     Uterine CA  . Sister Alive     OA, skin cancer    Immunization History  Administered Date(s) Administered  . Influenza Split 03/04/2012, 03/11/2014  . Influenza,inj,Quad PF,36+ Mos 03/03/2013  . Pneumococcal Conjugate-13 07/03/2009  . Pneumococcal Polysaccharide-23 08/07/2014  . Zoster 09/30/1994    Allergies  Allergen Reactions  . Adhesive [Tape] Rash    Medications: Patient's Medications  New Prescriptions   No medications on file  Previous Medications   ACETAMINOPHEN (TYLENOL) 325 MG TABLET    Take 2 tablets (650 mg total) by mouth every 6 (six) hours as  needed for mild pain (or Fever >/= 101).   ADVAIR DISKUS 500-50 MCG/DOSE AEPB    INHALE 1 PUFF INTO LUNGS EVERY 12 HOURS   B COMPLEX VITAMINS CAPSULE    Take 1 capsule by mouth daily.   BENZONATATE (TESSALON) 200 MG CAPSULE       CALCIUM-VITAMIN D (OSCAL WITH D) 500-200 MG-UNIT PER TABLET    Take 1 tablet by mouth daily with breakfast.   CHOLECALCIFEROL (VITAMIN D PO)    Take 1 tablet by mouth daily.   FLUTICASONE (FLONASE) 50 MCG/ACT NASAL SPRAY       FOLIC ACID (FOLVITE) 1 MG TABLET    Take 1 tablet (1 mg total) by mouth daily.   FUROSEMIDE (LASIX) 20 MG TABLET    Take 20 mg by mouth daily.   LATANOPROST (XALATAN) 0.005 % OPHTHALMIC SOLUTION    Place 1 drop into both eyes at bedtime.   LEVALBUTEROL (XOPENEX) 0.63 MG/3ML NEBULIZER SOLUTION    Take 3 mLs (0.63 mg total) by nebulization every 6 (six) hours as needed for wheezing or shortness of breath.   LORAZEPAM (ATIVAN) 0.5 MG TABLET    Take 1 tablet (0.5 mg total) by mouth every 6 (six) hours as needed for anxiety.   MELATONIN PO    Take 1-3 tablets by mouth at bedtime as needed (for sleep).    MULTIPLE VITAMINS-MINERALS  (MULTIVITAMIN WITH MINERALS) TABLET    Take 1 tablet by mouth daily.   NU-IRON 150 MG CAPSULE    Take 150 mg by mouth daily.   PANTOPRAZOLE (PROTONIX) 40 MG TABLET    Take 1 tablet (40 mg total) by mouth 2 (two) times daily.   POLYETHYLENE GLYCOL (MIRALAX / GLYCOLAX) PACKET    Take 17 g by mouth daily.   PREDNISONE (DELTASONE) 20 MG TABLET       SULFAMETHOXAZOLE-TRIMETHOPRIM (BACTRIM DS,SEPTRA DS) 800-160 MG PER TABLET       TIMOLOL (BETIMOL) 0.5 % OPHTHALMIC SOLUTION    Place 1 drop into the left eye daily with breakfast.    TRAZODONE (DESYREL) 50 MG TABLET       WARFARIN (COUMADIN) 2.5 MG TABLET    Take 2.5-3.75 mg by mouth See admin instructions. Takes 2.17m everyday except 3.75 on Monday, Wednesday, and Friday   ZOLEDRONIC ACID (RECLAST) 5 MG/100ML SOLN    Inject 5 mg into the vein once. yearly  Modified Medications   No medications on file  Discontinued Medications   No medications on file    Review of Systems  Constitutional: Positive for fatigue. Negative for fever, chills, diaphoresis, activity change, appetite change and unexpected weight change.       Frail and generally weak.  HENT: Positive for hearing loss (Bilaterally) and voice change (Hoarse). Negative for congestion, ear discharge, ear pain, postnasal drip, rhinorrhea, sore throat, tinnitus and trouble swallowing.   Eyes: Negative.  Negative for pain, redness, itching and visual disturbance.  Respiratory: Positive for shortness of breath and wheezing (Intermittent). Negative for cough and choking.        History Wegener's granulomatosis. History of chronic sinusitis related to her Wegener's.  Cardiovascular: Negative for chest pain, palpitations and leg swelling.  Gastrointestinal: Negative for nausea, abdominal pain, diarrhea, constipation and abdominal distention.       History of GERD.  Endocrine: Negative for cold intolerance, heat intolerance, polydipsia, polyphagia and polyuria.       History of hyperglycemia induced  by steroid use.  Genitourinary: Positive for frequency. Negative for dysuria, urgency,  hematuria, flank pain, vaginal discharge, difficulty urinating and pelvic pain.       Frequency induced by furosemide. Episodes of incontinence of urine.  Musculoskeletal: Negative for myalgias, back pain, arthralgias, gait problem, neck pain and neck stiffness.       Generalized weakness  Skin: Negative.  Negative for color change, pallor and rash.  Allergic/Immunologic: Negative.   Neurological: Negative for dizziness, tremors, seizures, syncope, weakness, numbness and headaches.  Hematological: Negative for adenopathy. Does not bruise/bleed easily.       Chronic anemia.  Psychiatric/Behavioral: Negative.  Negative for suicidal ideas, hallucinations, behavioral problems, confusion, sleep disturbance, dysphoric mood and agitation. The patient is not nervous/anxious and is not hyperactive.     Filed Vitals:   08/10/14 1257  BP: 130/74  Pulse: 70  Temp: 98.7 F (37.1 C)  Resp: 18  Height: _0  (1.575 m)  Weight: 126 lb 11.2 oz (57.471 kg)   Body mass index is 23.17 kg/(m^2).  Physical Exam  Constitutional: She is oriented to person, place, and time. She appears well-developed. No distress.  Thin and frail. Has been losing weight.  HENT:  Head: Normocephalic and atraumatic.  Nose: Nose normal.  Mouth/Throat: Oropharynx is clear and moist. No oropharyngeal exudate.  Bilateral hearing loss.  Eyes: Conjunctivae and EOM are normal. Pupils are equal, round, and reactive to light. Right eye exhibits no discharge. Left eye exhibits no discharge. No scleral icterus.  Neck: Normal range of motion. Neck supple. No JVD present. No tracheal deviation present. No thyromegaly present.  Cardiovascular: Normal rate, regular rhythm, normal heart sounds and intact distal pulses.   No murmur heard. Pulmonary/Chest: Effort normal and breath sounds normal. No stridor. No respiratory distress. She has no wheezes. She  has no rales. She exhibits no tenderness.  Hoarse with speaking.  Abdominal: Soft. Bowel sounds are normal. She exhibits no distension and no mass. There is no tenderness. There is no rebound and no guarding.  Musculoskeletal: Normal range of motion. She exhibits edema (2+ bilateral). She exhibits no tenderness.  trace  Lymphadenopathy:    She has no cervical adenopathy.  Neurological: She is alert and oriented to person, place, and time. She has normal reflexes. She displays normal reflexes. No cranial nerve deficit. She exhibits normal muscle tone. Coordination normal.  Skin: Skin is warm and dry. No rash noted. She is not diaphoretic. No erythema. No pallor.  Corns on the left foot at the tip of the third toe and at the fifth metacarpal phalangeal joint slightly lateral.  Psychiatric: She has a normal mood and affect. Her behavior is normal. Judgment and thought content normal.     Labs reviewed: Nursing Home on 08/10/2014  Component Date Value Ref Range Status  . HM Colonoscopy 02/07/2011 Hemorrhoid per Dr. Penelope Coop   Final  Nursing Home on 08/03/2014  Component Date Value Ref Range Status  . Hemoglobin 08/01/2014 1.7* 12.0 - 16.0 g/dL Final  . HCT 08/01/2014 38  36 - 46 % Final  . Platelets 08/01/2014 319  150 - 399 K/L Final  . WBC 08/01/2014 11.5   Final  . Glucose 08/01/2014 91   Final  . BUN 08/01/2014 13  4 - 21 mg/dL Final  . Creatinine 08/01/2014 0.6  0.5 - 1.1 mg/dL Final  . Potassium 08/01/2014 3.8  3.4 - 5.3 mmol/L Final  . Sodium 08/01/2014 144  137 - 147 mmol/L Final  . Alkaline Phosphatase 08/01/2014 90  25 - 125 U/L Final  . ALT 08/01/2014  31  7 - 35 U/L Final  . AST 08/01/2014 22  13 - 35 U/L Final  . Bilirubin, Total 08/01/2014 0.6   Final  Admission on 07/20/2014, Discharged on 07/30/2014  No results displayed because visit has over 200 results.    Admission on 07/10/2014, Discharged on 07/10/2014  Component Date Value Ref Range Status  . Sodium 07/10/2014  137  135 - 145 mmol/L Final   Please note change in reference range.  . Potassium 07/10/2014 4.2  3.5 - 5.1 mmol/L Final   Please note change in reference range.  . Chloride 07/10/2014 108  96 - 112 mEq/L Final  . CO2 07/10/2014 24  19 - 32 mmol/L Final  . Glucose, Bld 07/10/2014 95  70 - 99 mg/dL Final  . BUN 07/10/2014 13  6 - 23 mg/dL Final  . Creatinine, Ser 07/10/2014 0.76  0.50 - 1.10 mg/dL Final  . Calcium 07/10/2014 9.3  8.4 - 10.5 mg/dL Final  . Total Protein 07/10/2014 6.7  6.0 - 8.3 g/dL Final  . Albumin 07/10/2014 3.4* 3.5 - 5.2 g/dL Final  . AST 07/10/2014 34  0 - 37 U/L Final  . ALT 07/10/2014 15  0 - 35 U/L Final  . Alkaline Phosphatase 07/10/2014 109  39 - 117 U/L Final  . Total Bilirubin 07/10/2014 0.5  0.3 - 1.2 mg/dL Final  . GFR calc non Af Amer 07/10/2014 78* >90 mL/min Final  . GFR calc Af Amer 07/10/2014 >90  >90 mL/min Final   Comment: (NOTE) The eGFR has been calculated using the CKD EPI equation. This calculation has not been validated in all clinical situations. eGFR's persistently <90 mL/min signify possible Chronic Kidney Disease.   . Anion gap 07/10/2014 5  5 - 15 Final  . WBC 07/10/2014 5.1  4.0 - 10.5 K/uL Final  . RBC 07/10/2014 3.71* 3.87 - 5.11 MIL/uL Final  . Hemoglobin 07/10/2014 9.0* 12.0 - 15.0 g/dL Final  . HCT 07/10/2014 29.6* 36.0 - 46.0 % Final  . MCV 07/10/2014 79.8  78.0 - 100.0 fL Final  . MCH 07/10/2014 24.3* 26.0 - 34.0 pg Final  . MCHC 07/10/2014 30.4  30.0 - 36.0 g/dL Final  . RDW 07/10/2014 19.7* 11.5 - 15.5 % Final  . Platelets 07/10/2014 445* 150 - 400 K/uL Final  . Neutrophils Relative % 07/10/2014 67  43 - 77 % Final  . Neutro Abs 07/10/2014 3.4  1.7 - 7.7 K/uL Final  . Lymphocytes Relative 07/10/2014 13  12 - 46 % Final  . Lymphs Abs 07/10/2014 0.6* 0.7 - 4.0 K/uL Final  . Monocytes Relative 07/10/2014 18* 3 - 12 % Final  . Monocytes Absolute 07/10/2014 0.9  0.1 - 1.0 K/uL Final  . Eosinophils Relative 07/10/2014 2  0 - 5  % Final  . Eosinophils Absolute 07/10/2014 0.1  0.0 - 0.7 K/uL Final  . Basophils Relative 07/10/2014 0  0 - 1 % Final  . Basophils Absolute 07/10/2014 0.0  0.0 - 0.1 K/uL Final  . Color, Urine 07/10/2014 YELLOW  YELLOW Final  . APPearance 07/10/2014 CLEAR  CLEAR Final  . Specific Gravity, Urine 07/10/2014 1.009  1.005 - 1.030 Final  . pH 07/10/2014 6.5  5.0 - 8.0 Final  . Glucose, UA 07/10/2014 NEGATIVE  NEGATIVE mg/dL Final  . Hgb urine dipstick 07/10/2014 NEGATIVE  NEGATIVE Final  . Bilirubin Urine 07/10/2014 NEGATIVE  NEGATIVE Final  . Ketones, ur 07/10/2014 NEGATIVE  NEGATIVE mg/dL Final  . Protein, ur 07/10/2014  NEGATIVE  NEGATIVE mg/dL Final  . Urobilinogen, UA 07/10/2014 0.2  0.0 - 1.0 mg/dL Final  . Nitrite 07/10/2014 NEGATIVE  NEGATIVE Final  . Leukocytes, UA 07/10/2014 NEGATIVE  NEGATIVE Final   MICROSCOPIC NOT DONE ON URINES WITH NEGATIVE PROTEIN, BLOOD, LEUKOCYTES, NITRITE, OR GLUCOSE <1000 mg/dL.  Marland Kitchen Prothrombin Time 07/10/2014 22.4* 11.6 - 15.2 seconds Final  . INR 07/10/2014 1.94* 0.00 - 1.49 Final  . Troponin I 07/10/2014 <0.03  <0.031 ng/mL Final   Comment:        NO INDICATION OF MYOCARDIAL INJURY. Please note change in reference range.     Dg Chest 2 View (if Patient Has Fever And/or Copd)  07/20/2014   CLINICAL DATA:  Cough and shortness of breath for 1 month.  EXAM: CHEST  2 VIEW  COMPARISON:  Chest x-rays dated 07/10/2014 and 07/08/2014 and 06/25/2014 and chest CT dated 09/24/2013  FINDINGS: The heart size and pulmonary vascularity are normal. There are multiple areas of scarring in both lungs, particularly in the right upper lobe and left lung base. There is a small area of scarring laterally at the left lung base as well as posterior medially. There is a tiny right pleural effusion, unchanged since 06/25/2014 but new since 09/24/2013.  The markings are slightly more accentuated at the bases than on the prior study.  No acute osseous abnormalities. There are  emphysematous changes bilaterally.  IMPRESSION: Emphysema with scarring in both lungs. Slight increased accentuation of the interstitial markings could represent superimposed bronchitis. Persistent small right pleural effusion.   Electronically Signed   By: Rozetta Nunnery M.D.   On: 07/20/2014 14:17   Ct Chest W Contrast  07/21/2014   CLINICAL DATA:  81 year old female with history of small vessel vasculitis (granulomatous polyangitis) with history of airway stenosis, presenting with loud wheeze and scant hemoptysis. Evaluate for potential parenchymal involvement of the lungs versus airway stenosis. Shortness of breath, wheezing and fever for 1 month.  EXAM: CT CHEST WITH CONTRAST  TECHNIQUE: Multidetector CT imaging of the chest was performed during intravenous contrast administration.  CONTRAST:  75m OMNIPAQUE IOHEXOL 300 MG/ML  SOLN  COMPARISON:  Chest CT 09/24/2013.  FINDINGS: Comment: Examination is limited by considerable patient respiratory motion.  Mediastinum/Lymph Nodes: Heart size is mildly enlarged. There is no significant pericardial fluid, thickening or pericardial calcification. Calcifications of the mitral annulus. No pathologically enlarged mediastinal or hilar lymph nodes. Esophagus is unremarkable in appearance.  Lungs/Pleura: In the proximal trachea shortly beneath the level of the vocal cords There is some thin irregular-shaped soft tissue which has the appearance of a small membrane, which appears intimately associated with the wall of the trachea. The trachea and mainstem bronchi are otherwise widely patent. Again noted are nodular appearing areas of architectural distortion in the lungs bilaterally, most notably in the left lower lobe where there is a 1.8 x 1.3 cm spiculated appearing nodular density in the superior segment of the left lower lobe, and a 2.4 x 1.5 cm spiculated appearing nodular density in the posterior aspect of the left lower lobe. Multiple other smaller nodules are noted.  These two largest lesions appear more prominent than the prior study from 09/24/2013, however, when compared to more remote prior studies such that is 01/03/2011, both of these lesions appear slightly smaller, and are presumably areas of chronic postinflammatory scarring given the patient's history of vasculitis. There is a background of mild diffuse ground-glass attenuation with mild interlobular septal thickening, suggesting a background of mild interstitial pulmonary  edema. Moderate bilateral pleural effusions are noted. Large bulla in the apex of the left hemithorax is unchanged.  Musculoskeletal/Soft Tissues: There are no aggressive appearing lytic or blastic lesions noted in the visualized portions of the skeleton.  Upper Abdomen: Unremarkable.  IMPRESSION: 1. There is a thin membranous appearing structure in the proximal trachea immediately beneath the level the vocal cords, which is of uncertain etiology and significance, but may account for the patient's upper airway wheezing on physical examination. Consideration for direct inspection with bronchoscopy is recommended if clinically appropriate. 2. The appearance of the chest suggests mild congestive heart failure, as above. 3. Multiple nodules throughout the lungs bilaterally (left greater than right), similar in appearance to numerous prior examinations, presumably sequela of underlying Wegner's granulomatosis. No new large cavitary lesion to suggest active parenchymal involvement at this time. 4. Mild cardiomegaly.   Electronically Signed   By: Vinnie Langton M.D.   On: 07/21/2014 13:01   Ct Maxillofacial W/cm  07/22/2014   CLINICAL DATA:  Wegner's granulomatosis. Question fungal sinusitis as cause of airway debris.  EXAM: CT MAXILLOFACIAL WITH CONTRAST  TECHNIQUE: Multidetector CT imaging of the maxillofacial structures was performed with intravenous contrast. Multiplanar CT image reconstructions were also generated. A small metallic BB was placed  on the right temple in order to reliably differentiate right from left.  CONTRAST:  113m OMNIPAQUE IOHEXOL 300 MG/ML  SOLN  COMPARISON:  05/08/2008  FINDINGS: Chronic pansinusitis status post medial maxillary antrostomies, bilateral turbinectomies, and ethmoidectomies. The right sphenoid sinus has also been opened. Mucosal thickening is diffuse throughout the sinuses and there are small layering effusions within the maxillary and right sphenoid sinuses. The surgical openings are patent, although the right sphenoidotomy is moderately narrowed by mucosal disease. There is a small perforation in the anterior lower nasal septum. These changes are all consistent with history of Wegner's granulomatosis. Concerning the clinical question of fungal sinusitis, there certainly could be fungal colonization (some of the sinus material is high-density), but no fungal mass or invasive sinusitis is detected to explain the obstructive airway mass on bronchoscopy.  Complete opacification of the bilateral mastoid and middle ear cavities. No wrist changes.  Bilateral cataract resection.  Otherwise, the orbits are negative.  Limited evaluation of the oral cavity, oropharynx, and hypopharynx due to intubation with retained fluid.  IMPRESSION: 1. Wegner's granulomatosis with pansinusitis and otomastoiditis described above. 2. Concerning the clinical question, there may be sinus fungal colonization but there is no visible mycetoma or invasive sinusitis.   Electronically Signed   By: JJorje GuildM.D.   On: 07/22/2014 22:34   Dg Chest Port 1 View  07/24/2014   CLINICAL DATA:  Acute respiratory failure.  Hypoxia.  EXAM: PORTABLE CHEST - 1 VIEW  COMPARISON:  07/23/2014  FINDINGS: Left PICC line tip: Lower SVC. Mild enlargement of the cardiopericardial silhouette diffuse interstitial accentuation. Suspected layering left pleural effusion. Left mid lung hazy opacity mildly improved.  Atherosclerotic calcification of the thoracic aorta.  Probable small right pleural effusion. Obscuration of the left hemidiaphragm, probably due to the pleural effusion and passive atelectasis.  IMPRESSION: 1. The dominant changes reduced hazy density in the left mid lung probably due to redistribution of left pleural effusion. There may be some slight improvement in the underlying interstitial accentuation which is probably a manifestation of pulmonary edema, less likely from atypical infectious process.   Electronically Signed   By: WSherryl BartersM.D.   On: 07/24/2014 10:13   Dg Chest Port 1  View  07/23/2014   CLINICAL DATA:  Acute respiratory failure  EXAM: PORTABLE CHEST - 1 VIEW  COMPARISON:  07/22/2014  FINDINGS: Cardiac shadow is stable. An endotracheal tube, left-sided PICC line and nasogastric catheter are stable in appearance. Increasing density is noted in the left retrocardiac region consistent with atelectasis. Diffuse vascular congestion is again identified.  IMPRESSION: Increasing left lower lobe atelectasis. The remainder of the exam is stable.   Electronically Signed   By: Inez Catalina M.D.   On: 07/23/2014 07:13   Dg Chest Port 1 View  07/22/2014   CLINICAL DATA:  Respiratory failure.  Endotracheal tube placement.  EXAM: PORTABLE CHEST - 1 VIEW  COMPARISON:  07/20/2014  FINDINGS: The endotracheal to is 3.5 cm above the carina. The NG tube is in the stomach. The cardiac silhouette, mediastinal and hilar contours are stable. Moderate pulmonary edema. Small pleural effusions.  IMPRESSION: The endotracheal tube and NG tubes are in good position.  Moderate pulmonary edema.   Electronically Signed   By: Kalman Jewels M.D.   On: 07/22/2014 14:21   Dg Chest Port 1 View  07/21/2014   CLINICAL DATA:  F/u SOB, droplet precautions, hx. Asthma, fever, cough and congestion, chest tightness and wheezing  EXAM: PORTABLE CHEST - 1 VIEW  COMPARISON:  the previous day's study  FINDINGS: Increase in diffuse interstitial infiltrates or edema, with peripheral  septal lines and some patchy new airspace opacities in bilateral lung bases. Heart size upper limits normal for technique. No effusion. Mild spondylitic changes in the mid thoracic spine.  IMPRESSION: 1. Progressive bilateral edema/infiltrates as above.   Electronically Signed   By: Arne Cleveland M.D.   On: 07/21/2014 11:02     Assessment/Plan  1. Pneumonia, organism unspecified Patient into the hospital and was discharged with diagnosis of pneumonia. Is unclear whether this was a community-acquired pneumonia or healthcare affiliated pneumonia. In fact it is unclear that she even had pneumonia. She certainly had MRSA cultured from the bronchial alveolar lavage. She had a mass in the trachea which was removed and immediately had relief of her severe dyspnea and hypoxemia. Fungal cultures are still pending because of her previous history of aspergillosis, but so far they are negative.  2. Left inguinal hernia Patient was hospitalized last summer for repair of a left inguinal hernia by Dr. Ninfa Linden. There does not appear to be any sustained problem in this area.  3. Edema Persistent probable about 3 months duration. She started on furosemide in the hospital. Her primary care physician recently increased this to 40 mg. She has experienced using compression stockings, but does not like to use them. - furosemide (LASIX) 40 MG tablet; One daily to prevent fluid retention  Dispense: 30 tablet; Refill: 3 - Compression stockings  4. History of aspergillosis Unclear as to whether any other recent respiratory problems had anything to do with his remote history.  5. Deaf, bilateral Impaired conversation  6. Mixed incontinence Observe. There have been some erythema of the buttocks but this seems to be improved since last week.  7. Weak Patient is engaged in physical therapy  8. Abnormality of gait Uses a cane and a walker now.  9. Scoliosis (and kyphoscoliosis), idiopathic Incidental problem of  little clinical significance  10. Long term current use of anticoagulant therapy Warfarin is being used for a history of pulmonary embolus. She will remain on this.  11. Cardiomegaly Recent finding on chest x-ray  12. Abnormal CXR CT abnormalities reported above.  13. Hoarse Persistent problem which she says is not her usual voice.  65. CAP (community acquired pneumonia) See notes under pneumonia above.  15. Acute respiratory failure with hypoxia Patient is doing well off oxygen at this time. O2 sat remains in the normal range.  [facility] [place] [place] Continue follow-up with [name], rheumatologist  17. Chronic sinusitis, unspecified location Continue follow-up with Dr. Lucia Gaskins, ENT.  18. Intrinsic asthma, mild intermittent, uncomplicated End expiration wheezes were noted 08/07/14 at her primary care office. Lungs relatively clear today.  19. Anemia of chronic disease Continue follow-up. Lab work recently done at [facility].  20. Protein-calorie malnutrition, severe Continue attention to dietary needs   21. Vitamin D deficiency - cholecalciferol (VITAMIN D) 1000 UNITS tablet; One daily for vitamin D supplement  Dispense: 30 tablet; Refill: 11

## 2014-08-11 LAB — IRON AND TIBC CHCC
%SAT: 22 % (ref 21–57)
Iron: 57 ug/dL (ref 41–142)
TIBC: 263 ug/dL (ref 236–444)
UIBC: 205 ug/dL (ref 120–384)

## 2014-08-11 LAB — FERRITIN CHCC: Ferritin: 1199 ng/ml — ABNORMAL HIGH (ref 9–269)

## 2014-08-11 NOTE — Telephone Encounter (Signed)
April from Dr. Alan Ripper office is calling wanting to know if we are going to see pt for the positive AFB? Please advise [place] thanks

## 2014-08-11 NOTE — Telephone Encounter (Signed)
Direct # to April is [phone]

## 2014-08-11 NOTE — Telephone Encounter (Signed)
April from Dr. Harrington Challenger office calling to see if pt will be treated here for please call back [phone].

## 2014-08-12 ENCOUNTER — Inpatient Hospital Stay: Payer: Medicare Other | Admitting: Internal Medicine

## 2014-08-12 NOTE — Telephone Encounter (Signed)
lmtcb for April.  

## 2014-08-12 NOTE — Telephone Encounter (Signed)
April returned call. Informed April of the recs per [facility]. April verbalized understanding and stated they faxed over labs for CHF and wanted to ensure f/u. Informed April pt has f/u in mid March. Will need to inform April pt is not seeing Cardiologist.   lmtcb for April.

## 2014-08-12 NOTE — Telephone Encounter (Signed)
Let them know that we will.  This is almost certainly MAC, and would not treat unless she has active symptoms or worsening cxr.

## 2014-08-12 NOTE — Telephone Encounter (Signed)
[phone] April calling back

## 2014-08-13 ENCOUNTER — Encounter: Payer: Self-pay | Admitting: Pulmonary Disease

## 2014-08-13 ENCOUNTER — Ambulatory Visit (INDEPENDENT_AMBULATORY_CARE_PROVIDER_SITE_OTHER): Payer: Medicare Other | Admitting: Pulmonary Disease

## 2014-08-13 ENCOUNTER — Inpatient Hospital Stay: Payer: Medicare Other | Admitting: Adult Health

## 2014-08-13 VITALS — BP 122/80 | HR 110 | Temp 96.8°F

## 2014-08-13 DIAGNOSIS — Z2239 Carrier of other specified bacterial diseases: Secondary | ICD-10-CM

## 2014-08-13 DIAGNOSIS — M3130 Wegener's granulomatosis without renal involvement: Secondary | ICD-10-CM

## 2014-08-13 DIAGNOSIS — A319 Mycobacterial infection, unspecified: Secondary | ICD-10-CM | POA: Insufficient documentation

## 2014-08-13 DIAGNOSIS — J324 Chronic pansinusitis: Secondary | ICD-10-CM

## 2014-08-13 DIAGNOSIS — J45909 Unspecified asthma, uncomplicated: Secondary | ICD-10-CM

## 2014-08-13 DIAGNOSIS — R918 Other nonspecific abnormal finding of lung field: Secondary | ICD-10-CM

## 2014-08-13 NOTE — Assessment & Plan Note (Signed)
The patient has a history of pulmonary nodules that have been stable over serial scans, and felt to represent postinflammatory scarring from her Wegener's granulomatosis. Now we find out that she has a BAL at bronchoscopy that is positive for MAC. She has no constitutional symptoms or other findings that would push me to be aggressive with treatment. Therefore we will follow her going forward.

## 2014-08-13 NOTE — Patient Instructions (Signed)
No change in your breathing medications Keep up with your sinus rinses, and followup closely with Dr. Lucia Gaskins.  This is the source of your mucus/secretions. followup with me again in 75mos, with chest xray on the same day.

## 2014-08-13 NOTE — Assessment & Plan Note (Signed)
The patient has severe chronic sinusitis, and her recent episode of respiratory distress was more related to postnasal drip with congealed secretions over the larynx more so than an actual lower airway problem. She required bronchoscopy in order to remove a very large plug from upper airway in into her trachea. She is currently using sinus rinses, but is continuing to have postnasal drip with a cough productive of discolored mucus. She is continuing under the care of Dr. Lucia Gaskins, and I have asked her to keep up with the follow-up visits. One question that I have is whether her ongoing sinus disease is being driven by her Wegener's, and whether she may need more aggressive treatment of this. She is being followed by rheumatology closely, and I will leave that decision to them.

## 2014-08-13 NOTE — Assessment & Plan Note (Signed)
The patient feels that her breathing and exertional tolerance is near her usual baseline. It is interesting that her lower airway was really not an issue for her during the recent hospitalization. It is unclear how much of her airflow obstruction is related to true asthma versus a contribution from her bronchial stenosis. I have asked her to continue on her current meds for now.

## 2014-08-13 NOTE — Progress Notes (Signed)
   Subjective:    Patient ID: [name], female    DOB: August 14, 1932, 81 y.o.   MRN: [id number]  HPI The patient comes in today for a post hospital follow-up. She has known chronic pansinusitis, Wegener's granulomatosis, and airflow obstruction related to bronchial stenosis and a probable component of asthma. She was recently in the hospital with acute respiratory distress, and was found to have stridor with significant mucus plugging over her larynx that extended down into the trachea. This was removed bronchoscopically, and cultures did return positive for MRSA and MAC. The patient was seen by otolaryngology and treated with antibiotics and aggressive nasal hygiene. She has seen her otolaryngologist as an outpatient, who recommended ongoing sinus rinses. She is also followed by rheumatology for her Wegener's. The patient feels that she is breathing well today, but continues to have a lot of postnasal drip with upper airway congestion and cough with purulent mucus.   Review of Systems  Constitutional: Negative for fever and unexpected weight change.  HENT: Positive for congestion. Negative for dental problem, ear pain, nosebleeds, postnasal drip, rhinorrhea, sinus pressure, sneezing, sore throat and trouble swallowing.   Eyes: Negative for redness and itching.  Respiratory: Positive for cough. Negative for chest tightness, shortness of breath and wheezing.   Cardiovascular: Negative for palpitations and leg swelling.  Gastrointestinal: Negative for nausea and vomiting.  Genitourinary: Negative for dysuria.  Musculoskeletal: Negative for joint swelling.  Skin: Negative for rash.  Neurological: Negative for headaches.  Hematological: Does not bruise/bleed easily.  Psychiatric/Behavioral: Negative for dysphoric mood. The patient is not nervous/anxious.        Objective:   Physical Exam Frail-appearing female, hard of hearing, and no acute distress Nose without purulence or discharge noted,  but crusting seen Neck without lymphadenopathy or thyromegaly Chest with good airflow, and clear breath sounds except for mild coarseness, no wheezing Cardiac exam with regular rate and rhythm Lower extremities with no significant edema, no cyanosis Alert and oriented, moves all 4 extremities.       Assessment & Plan:

## 2014-08-14 ENCOUNTER — Other Ambulatory Visit: Payer: Self-pay

## 2014-08-14 ENCOUNTER — Inpatient Hospital Stay (HOSPITAL_COMMUNITY)
Admission: EM | Admit: 2014-08-14 | Discharge: 2014-08-18 | DRG: 206 | Disposition: A | Discharge status: Skilled Nursing Facility | Payer: Medicare Other | Attending: Internal Medicine | Admitting: Internal Medicine

## 2014-08-14 ENCOUNTER — Emergency Department (HOSPITAL_COMMUNITY): Payer: Medicare Other

## 2014-08-14 DIAGNOSIS — H409 Unspecified glaucoma: Secondary | ICD-10-CM | POA: Diagnosis present

## 2014-08-14 DIAGNOSIS — J80 Acute respiratory distress syndrome: Secondary | ICD-10-CM | POA: Diagnosis present

## 2014-08-14 DIAGNOSIS — R32 Unspecified urinary incontinence: Secondary | ICD-10-CM | POA: Diagnosis present

## 2014-08-14 DIAGNOSIS — R0602 Shortness of breath: Secondary | ICD-10-CM | POA: Diagnosis present

## 2014-08-14 DIAGNOSIS — H9192 Unspecified hearing loss, left ear: Secondary | ICD-10-CM | POA: Diagnosis present

## 2014-08-14 DIAGNOSIS — R0603 Acute respiratory distress: Secondary | ICD-10-CM | POA: Diagnosis present

## 2014-08-14 DIAGNOSIS — I509 Heart failure, unspecified: Secondary | ICD-10-CM | POA: Diagnosis present

## 2014-08-14 DIAGNOSIS — J45909 Unspecified asthma, uncomplicated: Secondary | ICD-10-CM | POA: Diagnosis present

## 2014-08-14 DIAGNOSIS — E874 Mixed disorder of acid-base balance: Secondary | ICD-10-CM | POA: Diagnosis present

## 2014-08-14 DIAGNOSIS — E559 Vitamin D deficiency, unspecified: Secondary | ICD-10-CM | POA: Diagnosis present

## 2014-08-14 DIAGNOSIS — Z86711 Personal history of pulmonary embolism: Secondary | ICD-10-CM

## 2014-08-14 DIAGNOSIS — J019 Acute sinusitis, unspecified: Secondary | ICD-10-CM | POA: Diagnosis present

## 2014-08-14 DIAGNOSIS — M3130 Wegener's granulomatosis without renal involvement: Secondary | ICD-10-CM | POA: Diagnosis present

## 2014-08-14 DIAGNOSIS — J324 Chronic pansinusitis: Secondary | ICD-10-CM | POA: Diagnosis present

## 2014-08-14 DIAGNOSIS — Z85820 Personal history of malignant melanoma of skin: Secondary | ICD-10-CM | POA: Diagnosis not present

## 2014-08-14 DIAGNOSIS — Z86718 Personal history of other venous thrombosis and embolism: Secondary | ICD-10-CM

## 2014-08-14 DIAGNOSIS — Z87891 Personal history of nicotine dependence: Secondary | ICD-10-CM

## 2014-08-14 DIAGNOSIS — J310 Chronic rhinitis: Secondary | ICD-10-CM | POA: Diagnosis present

## 2014-08-14 DIAGNOSIS — Z7901 Long term (current) use of anticoagulants: Secondary | ICD-10-CM | POA: Diagnosis not present

## 2014-08-14 DIAGNOSIS — J329 Chronic sinusitis, unspecified: Secondary | ICD-10-CM | POA: Diagnosis present

## 2014-08-14 DIAGNOSIS — N179 Acute kidney failure, unspecified: Secondary | ICD-10-CM | POA: Diagnosis present

## 2014-08-14 DIAGNOSIS — M1990 Unspecified osteoarthritis, unspecified site: Secondary | ICD-10-CM | POA: Diagnosis present

## 2014-08-14 DIAGNOSIS — H5440 Blindness, one eye, unspecified eye: Secondary | ICD-10-CM | POA: Diagnosis present

## 2014-08-14 DIAGNOSIS — J9602 Acute respiratory failure with hypercapnia: Secondary | ICD-10-CM

## 2014-08-14 DIAGNOSIS — T17990A Other foreign object in respiratory tract, part unspecified in causing asphyxiation, initial encounter: Secondary | ICD-10-CM | POA: Diagnosis present

## 2014-08-14 DIAGNOSIS — Z7952 Long term (current) use of systemic steroids: Secondary | ICD-10-CM

## 2014-08-14 DIAGNOSIS — J439 Emphysema, unspecified: Secondary | ICD-10-CM | POA: Diagnosis present

## 2014-08-14 LAB — I-STAT CHEM 8, ED
BUN: 36 mg/dL — ABNORMAL HIGH (ref 6–23)
CHLORIDE: 101 mmol/L (ref 96–112)
Calcium, Ion: 1.27 mmol/L (ref 1.13–1.30)
Creatinine, Ser: 1.4 mg/dL — ABNORMAL HIGH (ref 0.50–1.10)
GLUCOSE: 263 mg/dL — AB (ref 70–99)
HEMATOCRIT: 44 % (ref 36.0–46.0)
Hemoglobin: 15 g/dL (ref 12.0–15.0)
POTASSIUM: 4.3 mmol/L (ref 3.5–5.1)
Sodium: 137 mmol/L (ref 135–145)
TCO2: 26 mmol/L (ref 0–100)

## 2014-08-14 LAB — CBC
HEMATOCRIT: 42.4 % (ref 36.0–46.0)
HEMOGLOBIN: 13.6 g/dL (ref 12.0–15.0)
MCH: 27.8 pg (ref 26.0–34.0)
MCHC: 32.1 g/dL (ref 30.0–36.0)
MCV: 86.5 fL (ref 78.0–100.0)
Platelets: 318 10*3/uL (ref 150–400)
RBC: 4.9 MIL/uL (ref 3.87–5.11)
RDW: 27.7 % — ABNORMAL HIGH (ref 11.5–15.5)
WBC: 10.4 10*3/uL (ref 4.0–10.5)

## 2014-08-14 LAB — I-STAT ARTERIAL BLOOD GAS, ED
ACID-BASE DEFICIT: 3 mmol/L — AB (ref 0.0–2.0)
Acid-Base Excess: 1 mmol/L (ref 0.0–2.0)
BICARBONATE: 30.1 meq/L — AB (ref 20.0–24.0)
Bicarbonate: 22.2 mEq/L (ref 20.0–24.0)
O2 Saturation: 93 %
O2 Saturation: 100 %
PCO2 ART: 97.5 mmHg — AB (ref 35.0–45.0)
PO2 ART: 185 mmHg — AB (ref 80.0–100.0)
Patient temperature: 98.6
Patient temperature: 98.6
TCO2: 33 mmol/L (ref 0–100)
TCO2: 23 mmol/L (ref 0–100)
pCO2 arterial: 25.7 mmHg — ABNORMAL LOW (ref 35.0–45.0)
pH, Arterial: 7.098 — CL (ref 7.350–7.450)
pH, Arterial: 7.546 — ABNORMAL HIGH (ref 7.350–7.450)
pO2, Arterial: 94 mmHg (ref 80.0–100.0)

## 2014-08-14 LAB — I-STAT CG4 LACTIC ACID, ED: Lactic Acid, Venous: 1.3 mmol/L (ref 0.5–2.0)

## 2014-08-14 LAB — I-STAT TROPONIN, ED: Troponin i, poc: 0.11 ng/mL (ref 0.00–0.08)

## 2014-08-14 LAB — PROTIME-INR
INR: 1.8 — AB (ref 0.00–1.49)
PROTHROMBIN TIME: 21 s — AB (ref 11.6–15.2)

## 2014-08-14 LAB — BRAIN NATRIURETIC PEPTIDE: B NATRIURETIC PEPTIDE 5: 216.7 pg/mL — AB (ref 0.0–100.0)

## 2014-08-14 MED ORDER — ASPIRIN 300 MG RE SUPP
300.0000 mg | Freq: Once | RECTAL | Status: AC
  Administered 2014-08-14: 300 mg via RECTAL
  Filled 2014-08-14: qty 1

## 2014-08-14 MED ORDER — NITROGLYCERIN IN D5W 200-5 MCG/ML-% IV SOLN
0.0000 ug/min | Freq: Once | INTRAVENOUS | Status: AC
  Administered 2014-08-14: 40 ug/min via INTRAVENOUS

## 2014-08-14 NOTE — ED Notes (Signed)
Pt from [facility] with shortness of breath.  EMS reports no air movement during assessment.  Pt placed on CPAP.  BP 250/120.  Pt with hx of CHF and similar episode.

## 2014-08-14 NOTE — Progress Notes (Signed)
This note also relates to the following rows which could not be included: BP - Cannot attach notes to unvalidated device data   Removed from BiPAP ABG done and patient much better. Will continue to monitor as needed.

## 2014-08-14 NOTE — ED Notes (Signed)
Per EDP, systolic goal with nitro drip is less that 200.

## 2014-08-14 NOTE — ED Notes (Signed)
Nitro drip stopped per MD

## 2014-08-14 NOTE — ED Notes (Signed)
Nitro started at 40 mcg.  Pt placed on BiPAP.

## 2014-08-14 NOTE — ED Notes (Signed)
Pt coughing up green mucous with resident and RN at bedside.

## 2014-08-15 DIAGNOSIS — R0600 Dyspnea, unspecified: Secondary | ICD-10-CM

## 2014-08-15 DIAGNOSIS — R0603 Acute respiratory distress: Secondary | ICD-10-CM | POA: Insufficient documentation

## 2014-08-15 DIAGNOSIS — N179 Acute kidney failure, unspecified: Secondary | ICD-10-CM | POA: Diagnosis present

## 2014-08-15 DIAGNOSIS — E559 Vitamin D deficiency, unspecified: Secondary | ICD-10-CM

## 2014-08-15 DIAGNOSIS — J80 Acute respiratory distress syndrome: Secondary | ICD-10-CM

## 2014-08-15 LAB — PROTIME-INR
INR: 2.01 — AB (ref 0.00–1.49)
PROTHROMBIN TIME: 23 s — AB (ref 11.6–15.2)

## 2014-08-15 LAB — BASIC METABOLIC PANEL
ANION GAP: 8 (ref 5–15)
BUN: 30 mg/dL — ABNORMAL HIGH (ref 6–23)
CALCIUM: 8.9 mg/dL (ref 8.4–10.5)
CO2: 28 mmol/L (ref 19–32)
Chloride: 103 mmol/L (ref 96–112)
Creatinine, Ser: 1.14 mg/dL — ABNORMAL HIGH (ref 0.50–1.10)
GFR calc Af Amer: 52 mL/min — ABNORMAL LOW (ref >90)
GFR calc non Af Amer: 45 mL/min — ABNORMAL LOW (ref >90)
Glucose, Bld: 95 mg/dL (ref 70–99)
Potassium: 3.5 mmol/L (ref 3.5–5.1)
SODIUM: 139 mmol/L (ref 135–145)

## 2014-08-15 LAB — CBC
HEMATOCRIT: 35.1 % — AB (ref 36.0–46.0)
Hemoglobin: 11.1 g/dL — ABNORMAL LOW (ref 12.0–15.0)
MCH: 27.5 pg (ref 26.0–34.0)
MCHC: 31.6 g/dL (ref 30.0–36.0)
MCV: 87.1 fL (ref 78.0–100.0)
PLATELETS: 264 10*3/uL (ref 150–400)
RBC: 4.03 MIL/uL (ref 3.87–5.11)
RDW: 27.5 % — ABNORMAL HIGH (ref 11.5–15.5)
WBC: 9.6 10*3/uL (ref 4.0–10.5)

## 2014-08-15 MED ORDER — PANTOPRAZOLE SODIUM 40 MG PO TBEC
40.0000 mg | DELAYED_RELEASE_TABLET | Freq: Two times a day (BID) | ORAL | Status: DC
  Administered 2014-08-15 – 2014-08-18 (×7): 40 mg via ORAL
  Filled 2014-08-15 (×7): qty 1

## 2014-08-15 MED ORDER — POLYETHYLENE GLYCOL 3350 17 G PO PACK
17.0000 g | PACK | Freq: Every day | ORAL | Status: DC
  Administered 2014-08-18: 17 g via ORAL
  Filled 2014-08-15 (×4): qty 1

## 2014-08-15 MED ORDER — MULTI-VITAMIN/MINERALS PO TABS: 1.0000 | ORAL_TABLET | Freq: Every day | ORAL | Status: DC

## 2014-08-15 MED ORDER — FLUTICASONE PROPIONATE 50 MCG/ACT NA SUSP
1.0000 | Freq: Every day | NASAL | Status: DC
  Administered 2014-08-15 – 2014-08-18 (×4): 1 via NASAL
  Filled 2014-08-15: qty 16

## 2014-08-15 MED ORDER — IPRATROPIUM-ALBUTEROL 0.5-2.5 (3) MG/3ML IN SOLN
3.0000 mL | Freq: Three times a day (TID) | RESPIRATORY_TRACT | Status: DC
  Administered 2014-08-16 – 2014-08-18 (×7): 3 mL via RESPIRATORY_TRACT
  Filled 2014-08-15 (×7): qty 3

## 2014-08-15 MED ORDER — ADULT MULTIVITAMIN W/MINERALS CH
1.0000 | ORAL_TABLET | Freq: Every day | ORAL | Status: DC
  Administered 2014-08-15 – 2014-08-18 (×3): 1 via ORAL
  Filled 2014-08-15 (×5): qty 1

## 2014-08-15 MED ORDER — SALINE SPRAY 0.65 % NA SOLN
1.0000 | NASAL | Status: DC
  Administered 2014-08-15 – 2014-08-18 (×22): 1 via NASAL
  Filled 2014-08-15 (×2): qty 44

## 2014-08-15 MED ORDER — ONDANSETRON HCL 4 MG PO TABS: 4.0000 mg | ORAL_TABLET | Freq: Four times a day (QID) | ORAL | Status: DC | PRN

## 2014-08-15 MED ORDER — POLYSACCHARIDE IRON COMPLEX 150 MG PO CAPS: 150.0000 mg | ORAL_CAPSULE | Freq: Every day | ORAL | Status: DC

## 2014-08-15 MED ORDER — B COMPLEX-C PO TABS
1.0000 | ORAL_TABLET | Freq: Every day | ORAL | Status: DC
  Administered 2014-08-15 – 2014-08-18 (×3): 1 via ORAL
  Filled 2014-08-15 (×5): qty 1

## 2014-08-15 MED ORDER — SODIUM CHLORIDE 0.9 % IV SOLN: INTRAVENOUS | Status: DC

## 2014-08-15 MED ORDER — ONDANSETRON HCL 4 MG/2ML IJ SOLN: 4.0000 mg | Freq: Four times a day (QID) | INTRAMUSCULAR | Status: DC | PRN

## 2014-08-15 MED ORDER — SODIUM CHLORIDE 0.45 % IV SOLN
INTRAVENOUS | Status: DC
  Administered 2014-08-15: 03:00:00 via INTRAVENOUS

## 2014-08-15 MED ORDER — IPRATROPIUM-ALBUTEROL 0.5-2.5 (3) MG/3ML IN SOLN
3.0000 mL | RESPIRATORY_TRACT | Status: DC
  Administered 2014-08-15 (×5): 3 mL via RESPIRATORY_TRACT
  Filled 2014-08-15 (×5): qty 3

## 2014-08-15 MED ORDER — HYDROMORPHONE HCL 1 MG/ML IJ SOLN
0.5000 mg | INTRAMUSCULAR | Status: DC | PRN
Start: 2014-08-15 — End: 2014-08-18

## 2014-08-15 MED ORDER — TIMOLOL HEMIHYDRATE 0.5 % OP SOLN
1.0000 [drp] | Freq: Every day | OPHTHALMIC | Status: DC
  Administered 2014-08-15 – 2014-08-18 (×4): 1 [drp] via OPHTHALMIC
  Filled 2014-08-15: qty 5

## 2014-08-15 MED ORDER — SODIUM CHLORIDE 0.9 % IJ SOLN
3.0000 mL | INTRAMUSCULAR | Status: DC | PRN
  Administered 2014-08-18: 3 mL via INTRAVENOUS
  Filled 2014-08-15: qty 3

## 2014-08-15 MED ORDER — GUAIFENESIN ER 600 MG PO TB12
600.0000 mg | ORAL_TABLET | Freq: Two times a day (BID) | ORAL | Status: DC
  Administered 2014-08-15 – 2014-08-18 (×7): 600 mg via ORAL
  Filled 2014-08-15 (×8): qty 1

## 2014-08-15 MED ORDER — PREDNISONE 5 MG PO TABS
5.0000 mg | ORAL_TABLET | Freq: Every day | ORAL | Status: DC
  Administered 2014-08-15 – 2014-08-18 (×4): 5 mg via ORAL
  Filled 2014-08-15 (×5): qty 1

## 2014-08-15 MED ORDER — SODIUM CHLORIDE 0.9 % IJ SOLN
3.0000 mL | Freq: Two times a day (BID) | INTRAMUSCULAR | Status: DC
  Administered 2014-08-15 – 2014-08-18 (×5): 3 mL via INTRAVENOUS

## 2014-08-15 MED ORDER — LATANOPROST 0.005 % OP SOLN
1.0000 [drp] | Freq: Every day | OPHTHALMIC | Status: DC
Start: 2014-08-15 — End: 2014-08-18
  Administered 2014-08-15 – 2014-08-18 (×3): 1 [drp] via OPHTHALMIC
  Filled 2014-08-15: qty 2.5

## 2014-08-15 MED ORDER — ALUM & MAG HYDROXIDE-SIMETH 200-200-20 MG/5ML PO SUSP: 30.0000 mL | Freq: Four times a day (QID) | ORAL | Status: DC | PRN

## 2014-08-15 MED ORDER — B COMPLEX VITAMINS PO CAPS: 1.0000 | ORAL_CAPSULE | Freq: Every day | ORAL | Status: DC

## 2014-08-15 MED ORDER — CEFTRIAXONE SODIUM IN DEXTROSE 20 MG/ML IV SOLN
1.0000 g | INTRAVENOUS | Status: DC
  Administered 2014-08-15 – 2014-08-16 (×2): 1 g via INTRAVENOUS
  Filled 2014-08-15 (×4): qty 50

## 2014-08-15 MED ORDER — SULFAMETHOXAZOLE-TRIMETHOPRIM 800-160 MG PO TABS
1.0000 | ORAL_TABLET | ORAL | Status: DC
  Administered 2014-08-17: 1 via ORAL
  Filled 2014-08-15 (×2): qty 1

## 2014-08-15 MED ORDER — GUAIFENESIN 100 MG/5ML PO SOLN
10.0000 mL | ORAL | Status: DC | PRN
  Filled 2014-08-15: qty 10

## 2014-08-15 MED ORDER — RISAQUAD PO CAPS
2.0000 | ORAL_CAPSULE | Freq: Every day | ORAL | Status: DC
  Administered 2014-08-15 – 2014-08-18 (×4): 2 via ORAL
  Filled 2014-08-15 (×4): qty 2

## 2014-08-15 MED ORDER — WARFARIN SODIUM 3 MG PO TABS
3.0000 mg | ORAL_TABLET | Freq: Once | ORAL | Status: AC
  Administered 2014-08-15: 3 mg via ORAL
  Filled 2014-08-15: qty 1

## 2014-08-15 MED ORDER — SODIUM CHLORIDE 0.9 % IV SOLN: 250.0000 mL | INTRAVENOUS | Status: DC | PRN

## 2014-08-15 MED ORDER — CALCIUM CARBONATE-VITAMIN D 500-200 MG-UNIT PO TABS
1.0000 | ORAL_TABLET | Freq: Every day | ORAL | Status: DC
  Administered 2014-08-15 – 2014-08-18 (×3): 1 via ORAL
  Filled 2014-08-15 (×6): qty 1

## 2014-08-15 MED ORDER — LORAZEPAM 0.5 MG PO TABS
0.5000 mg | ORAL_TABLET | Freq: Four times a day (QID) | ORAL | Status: DC | PRN
  Administered 2014-08-15 – 2014-08-18 (×4): 0.5 mg via ORAL
  Filled 2014-08-15 (×4): qty 1

## 2014-08-15 MED ORDER — WARFARIN - PHARMACIST DOSING INPATIENT
Freq: Every day | Status: DC
  Administered 2014-08-16: 18:00:00

## 2014-08-15 MED ORDER — FOLIC ACID 1 MG PO TABS
1.0000 mg | ORAL_TABLET | Freq: Every day | ORAL | Status: DC
  Administered 2014-08-15 – 2014-08-18 (×3): 1 mg via ORAL
  Filled 2014-08-15 (×5): qty 1

## 2014-08-15 MED ORDER — ACETAMINOPHEN 650 MG RE SUPP: 650.0000 mg | Freq: Four times a day (QID) | RECTAL | Status: DC | PRN

## 2014-08-15 MED ORDER — ACETAMINOPHEN 325 MG PO TABS: 650.0000 mg | ORAL_TABLET | Freq: Four times a day (QID) | ORAL | Status: DC | PRN

## 2014-08-15 MED ORDER — VITAMIN D3 25 MCG (1000 UNIT) PO TABS
1000.0000 [IU] | ORAL_TABLET | Freq: Every day | ORAL | Status: DC
  Administered 2014-08-15 – 2014-08-18 (×3): 1000 [IU] via ORAL
  Filled 2014-08-15 (×5): qty 1

## 2014-08-15 MED ORDER — WARFARIN SODIUM 2.5 MG PO TABS: 2.5000 mg | ORAL_TABLET | ORAL | Status: DC

## 2014-08-15 MED ORDER — FUROSEMIDE 40 MG PO TABS
40.0000 mg | ORAL_TABLET | Freq: Every day | ORAL | Status: DC
  Administered 2014-08-15: 40 mg via ORAL
  Filled 2014-08-15: qty 1

## 2014-08-15 MED ORDER — OXYCODONE HCL 5 MG PO TABS: 5.0000 mg | ORAL_TABLET | ORAL | Status: DC | PRN

## 2014-08-15 NOTE — ED Notes (Signed)
Attempted to call report to 2C. 

## 2014-08-15 NOTE — Progress Notes (Signed)
Pt placed on Face Tent with room air 35% for humidification. Pt tolerating well.

## 2014-08-15 NOTE — Progress Notes (Addendum)
ANTICOAGULATION CONSULT NOTE - Initial Consult  Pharmacy Consult for Coumadin Indication: h/o PE and clotting d/o  Allergies  Allergen Reactions  . Adhesive [Tape] Rash    Vital Signs: Temp: 97.6 F (36.4 C) (02/12 2052) Temp Source: Axillary (02/12 2052) BP: 111/58 mmHg (02/12 2342) Pulse Rate: 83 (02/12 2342)  Labs:  Recent Labs  08/14/14 2050 08/14/14 2105  HGB 13.6 15.0  HCT 42.4 44.0  PLT 318  --   LABPROT 21.0*  --   INR 1.80*  --   CREATININE  --  1.40*    Estimated Creatinine Clearance: 24.6 mL/min (by C-G formula based on Cr of 1.4).   Medical History: Past Medical History  Diagnosis Date  . Wegener's granulomatosis   . OA (osteoarthritis)   . Blindness of one eye   . Hearing loss in left ear     hearing aid both ears  . Bladder incontinence   . Glaucoma   . Clotting disorder   . History of pulmonary embolism 1997  . Shortness of breath dyspnea   . Pneumonia 12/15  . Multiple allergies   . Chronic sinusitis      Assessment: 81yo female presents w/ acute respiratory distress and known CHF, to continue Coumadin for h/o PE w/ clotting d/o.  Goal of Therapy:  INR 2-3   Plan:  Will check INR prior to dosing Coumadin and monitor INR for dose adjustments.  Wynona Neat, PharmD, BCPS  08/15/2014,1:38 AM   Addendum: INR is therapeutic on low end of goal at 2.01 - will give a little extra tonight. No bleeding noted. PTA: 2.5 mg/d, last dose reported as 2/11 so may have missed dose on 2/12  Warfarin 3 mg PO tonight INR daily  [facility], Pharm.D., BCPS Clinical Pharmacist Pager: [phone] 08/15/2014 2:46 PM

## 2014-08-15 NOTE — ED Provider Notes (Signed)
CSN: [id number]     Arrival date & time 08/14/14  2042 History   First MD Initiated Contact with Patient 08/14/14 2048     Chief Complaint  Patient presents with  . Respiratory Distress     (Consider location/radiation/quality/duration/timing/severity/associated sxs/prior Treatment) Patient is a 81 y.o. female presenting with shortness of breath. The history is provided by the patient and the EMS personnel.  Shortness of Breath Severity:  Severe Onset quality:  Sudden Duration:  1 day Timing:  Constant Progression:  Worsening Chronicity:  New Context: not URI   Relieved by:  Nothing Worsened by:  Exertion Associated symptoms: cough and sputum production   Associated symptoms: no chest pain   Risk factors comment:  Known CHF, emphysema, also h/o resp failure 2/2 secretions   Past Medical History  Diagnosis Date  . Wegener's granulomatosis   . OA (osteoarthritis)   . Blindness of one eye   . Hearing loss in left ear     hearing aid both ears  . Bladder incontinence   . Glaucoma   . Clotting disorder   . History of pulmonary embolism 1997  . Shortness of breath dyspnea   . Pneumonia 12/15  . Multiple allergies   . Chronic sinusitis    Past Surgical History  Procedure Laterality Date  . Tubal ligation    . Pubovaginal sling    . Cataract extraction    . Melanoma excision      left leg  . Vena cava filter placement  1997    [place] filter  . Knee arthroscopy  2006    rt   . Inguinal hernia repair Left 02/17/2014    Procedure: LEFT INGUINAL HERNIA REPAIR WITH MESH;  Surgeon: Harl Bowie, MD;  Location: [facility];  Service: General;  Laterality: Left;  . Insertion of mesh N/A 02/17/2014    Procedure: INSERTION OF MESH;  Surgeon: Harl Bowie, MD;  Location: [facility];  Service: General;  Laterality: N/A;   Family History  Problem Relation Age of Onset  . Heart disease Father     stroke-deceased  . Stroke Father   .  Congestive Heart Failure Mother     deceased - CHF  . Heart disease Mother   . Uterine cancer Sister   . Lymphoma Sister   . Skin cancer Sister     no melanoma   History  Substance Use Topics  . Smoking status: Former Smoker -- 1.50 packs/day for 30 years    Types: Cigarettes    Quit date: 07/03/1984  . Smokeless tobacco: Not on file     Comment: quit smoking 30 years ago  . Alcohol Use: 0.0 oz/week    0 Standard drinks or equivalent per week     Comment: 1-2 glasses   OB History    No data available     Review of Systems  Unable to perform ROS: Acuity of condition  Respiratory: Positive for cough, sputum production and shortness of breath.   Cardiovascular: Negative for chest pain.      Allergies  Adhesive  Home Medications   Prior to Admission medications   Medication Sig Start Date End Date Taking? Authorizing Provider  acetaminophen (TYLENOL) 325 MG tablet Take 2 tablets (650 mg total) by mouth every 6 (six) hours as needed for mild pain (or Fever >/= 101). 07/30/14   Reyne Dumas, MD  ADVAIR DISKUS 500-50 MCG/DOSE AEPB INHALE 1 PUFF INTO LUNGS EVERY 12 HOURS  Kathee Delton, MD  b complex vitamins capsule Take 1 capsule by mouth daily.    Historical Provider, MD  benzonatate (TESSALON) 200 MG capsule Take 200 mg by mouth 2 (two) times daily as needed.  07/16/14   Historical Provider, MD  calcium-vitamin D (OSCAL WITH D) 500-200 MG-UNIT per tablet Take 1 tablet by mouth daily with breakfast.    Historical Provider, MD  cholecalciferol (VITAMIN D) 1000 UNITS tablet One daily for vitamin D supplement 08/10/14   Estill Dooms, MD  fluticasone (FLONASE) 50 MCG/ACT nasal spray Place 1 spray into both nostrils as needed.  06/24/14   Historical Provider, MD  folic acid (FOLVITE) 1 MG tablet Take 1 tablet (1 mg total) by mouth daily. 07/30/14   Reyne Dumas, MD  furosemide (LASIX) 40 MG tablet One daily to prevent fluid retention 08/10/14   Estill Dooms, MD  latanoprost  (XALATAN) 0.005 % ophthalmic solution Place 1 drop into both eyes at bedtime.    Historical Provider, MD  levalbuterol Penne Lash) 0.63 MG/3ML nebulizer solution Take 3 mLs (0.63 mg total) by nebulization every 6 (six) hours as needed for wheezing or shortness of breath. 07/30/14   Reyne Dumas, MD  LORazepam (ATIVAN) 0.5 MG tablet Take 1 tablet (0.5 mg total) by mouth every 6 (six) hours as needed for anxiety. 07/30/14   Reyne Dumas, MD  MELATONIN PO Take 1-3 tablets by mouth at bedtime as needed (for sleep).     Historical Provider, MD  Multiple Vitamins-Minerals (MULTIVITAMIN WITH MINERALS) tablet Take 1 tablet by mouth daily.    Historical Provider, MD  NU-IRON 150 MG capsule Take 150 mg by mouth daily. 07/08/14   Historical Provider, MD  pantoprazole (PROTONIX) 40 MG tablet Take 1 tablet (40 mg total) by mouth 2 (two) times daily. 07/30/14   Reyne Dumas, MD  polyethylene glycol (MIRALAX / GLYCOLAX) packet Take 17 g by mouth daily. 07/30/14   Reyne Dumas, MD  predniSONE (DELTASONE) 5 MG tablet As directed    Historical Provider, MD  sulfamethoxazole-trimethoprim (BACTRIM DS,SEPTRA DS) 800-160 MG per tablet 1 tablet every M,W,F 06/12/14   Historical Provider, MD  timolol (BETIMOL) 0.5 % ophthalmic solution Place 1 drop into the left eye daily with breakfast.     Historical Provider, MD  warfarin (COUMADIN) 2.5 MG tablet Take 2.5-3.75 mg by mouth See admin instructions. Takes 2.5mg  everyday except 3.75 on Monday, Wednesday, and Friday    Historical Provider, MD  zoledronic acid (RECLAST) 5 MG/100ML SOLN Inject 5 mg into the vein once. yearly    Historical Provider, MD   BP 111/58 mmHg  Pulse 83  Temp(Src) 97.6 F (36.4 C) (Axillary)  Resp 20  SpO2 94% Physical Exam  Constitutional: She has a sickly appearance. She appears ill. She appears distressed.  HENT:  Head: Normocephalic and atraumatic.  Eyes: Conjunctivae are normal. Pupils are equal, round, and reactive to light.  Neck: Neck supple. No  tracheal deviation present.  Cardiovascular: Normal heart sounds and intact distal pulses.  Tachycardia present.  Exam reveals no friction rub.   No murmur heard. Pulmonary/Chest: Accessory muscle usage present. Tachypnea noted. She is in respiratory distress. She has decreased breath sounds. She has no wheezes. She has rhonchi. She has rales in the right lower field and the left lower field.  Abdominal: Soft. She exhibits no distension. There is no tenderness.  Musculoskeletal: She exhibits no edema.  Neurological: She is alert. She exhibits normal muscle tone.  Skin: Skin is warm.  Nursing note and vitals reviewed.   ED Course  Procedures (including critical care time) Labs Review Labs Reviewed  CBC - Abnormal; Notable for the following:    RDW 27.7 (*)    All other components within normal limits  BRAIN NATRIURETIC PEPTIDE - Abnormal; Notable for the following:    B Natriuretic Peptide 216.7 (*)    All other components within normal limits  PROTIME-INR - Abnormal; Notable for the following:    Prothrombin Time 21.0 (*)    INR 1.80 (*)    All other components within normal limits  I-STAT CHEM 8, ED - Abnormal; Notable for the following:    BUN 36 (*)    Creatinine, Ser 1.40 (*)    Glucose, Bld 263 (*)    All other components within normal limits  I-STAT TROPOININ, ED - Abnormal; Notable for the following:    Troponin i, poc 0.11 (*)    All other components within normal limits  I-STAT ARTERIAL BLOOD GAS, ED - Abnormal; Notable for the following:    pH, Arterial 7.098 (*)    pCO2 arterial 97.5 (*)    Bicarbonate 30.1 (*)    Acid-base deficit 3.0 (*)    All other components within normal limits  I-STAT ARTERIAL BLOOD GAS, ED - Abnormal; Notable for the following:    pH, Arterial 7.546 (*)    pCO2 arterial 25.7 (*)    pO2, Arterial 185.0 (*)    All other components within normal limits  PROTIME-INR  I-STAT CG4 LACTIC ACID, ED    Imaging Review Dg Chest Portable 1  View  08/14/2014   CLINICAL DATA:  Respiratory distress tonight, hypertension. History of CHF and pulmonary embolism.  EXAM: PORTABLE CHEST - 1 VIEW  COMPARISON:  Chest radiograph July 24, 2014  FINDINGS: The cardiac silhouette is upper limits of normal in size, mediastinal silhouette is nonsuspicious. Mildly calcified aortic knob. Mild chronic interstitial changes with improved aeration lung bases from prior imaging. Strandy densities without pleural effusions or focal consolidation. No pneumothorax. Soft tissue planes and included osseous structures are nonsuspicious. Thoracic levoscoliosis and degenerative change.  IMPRESSION: Borderline cardiomegaly and mild chronic interstitial changes with bibasilar atelectasis/scarring. Improved aeration lungs from prior imaging.   Electronically Signed   By: Elon Alas   On: 08/14/2014 21:29     EKG Interpretation None      MDM   Final diagnoses:  None    81 yo F with PMHx of CHF, Wegener's granulomatosis, h/o DVT/PE on coumadin, asthma, and h/o acute respiratory failure 2/2 thick nasopharyngeal secretions with chronic bronchial stenosis who presents with acute onset SOB with marked respiratory distress. See HPI above. On arrival, T 97.70F, HR 110-130s, RR 24-30, BP 229/107, satting 90% on CPAP. Exam as above, remarkable for marked respiratory distress with tachypnea, decreased BS and bibasilar rales and rhonchi.  Upon arrival, pt immediatly placed on BIPAP with initiation of nitro gtt for possible CHF exacerbation with subsequent flash pulmonary edema. EKG shows non-specific TWI and ST depression c/w stress/strain pattern with no evidence of STEMI. DDx is broad. Primary consideration also includes airway obstruction 2/2 mucus plugging and chronic nasopharyngeal secretions. Pt is AF, but will f/u CXR closely for eval of possible PNA/infectious component. Will also consider COPD/RAD component though at this time presentation is most c/w CHF  exacerbation with sympathetic response, and given marked tachycardia am hesitant to start breathing tx. Will give ASA, continue BIPAP, nitro gtt, and f/u labs/imaging.  CXR shows mild interstitial  edema with bibasilar atelectasis, with no focal findings to suggest focal PNA. Labs as above, CBC with no leukocytosis, and do not suspect acute infection at this time. BNP pending. No anemia. Chem8 with Cr 1.40. Lactate 1.30. ABG shows marked acute respiraotry acidosis with pH 7.1, pCO2 97.5, pO2 94. Bicarb 30, with suspected acute on chronic resp acidosis with metabolic compensation. Will continue BIPAP, nitro gtt. ASA given. UA ordered.  Pt coughed large amount of thick sputum, with improvement in HR to <100 and O2 sats 95% on 40% FiO2. The pt states she feels much better. BNP only mildly elevated at 217. BP now WNL, will d/c nitro gtt. Given improvement in sx with coughing up sputum, it is possible that sx may be 2/2 transient laryngeal obstruction 2/2 secretions. Repeat gas with respiratory alkalosis and resolution of resp failure. Will d/c BIPAP and admit to step-down for further RT and suctioning. Pt in agreement. Discussed case with pt's son-in-law, a Pulmonary MD, who is in agreement. Pt and family updated on plan of care.  Clinical Impression: 1. Acute respiratory failure with hypercapnia   2. Respiratory distress   3. AKI (acute kidney injury)      Disposition: Admit  Condition: Stable  Pt seen in conjunction with Dr. Dot Lanes, MD    Duffy Bruce, MD 08/15/14 1287  Dot Lanes, MD 08/15/14 [phone]

## 2014-08-15 NOTE — H&P (Signed)
[facility] Admission History and Physical       [name] VUD:[id number] DOB: 12-18-1932 DOA: 08/14/2014  Referring physician: EDP PCP:  [name], MD  Specialists:   Chief Complaint: SOB   HPI: [name] is a 81 y.o. female with a history of Wegener's Granulomatosis,Asthma, and PE on Coumadin Rx who presents to the ED with complaints of worsening SOB over the past days.   EMS placed her on CPAP on route the ED.    In the ED,  she was administered nebulizer treatments and received pulmonary toilet and expectorated a large mucus plug and began to improve.  She was referred for observation and monitoring.    She denied having any fevers or chills.     Review of Systems:  Constitutional: No Weight Loss, No Weight Gain, Night Sweats, Fevers, Chills, Dizziness, Light Headedness, Fatigue, or Generalized Weakness HEENT: No Headaches, Difficulty Swallowing,Tooth/Dental Problems,Sore Throat,  No Sneezing, Rhinitis, Ear Ache, Nasal Congestion, or Post Nasal Drip,  Cardio-vascular:  No Chest pain, Orthopnea, PND, Edema in Lower Extremities, Anasarca, Dizziness, Palpitations  Resp: +Dyspnea, No DOE, +Productive Cough, No Non-Productive Cough, No Hemoptysis, No Wheezing.    GI: No Heartburn, Indigestion, Abdominal Pain, Nausea, Vomiting, Diarrhea, Constipation, Hematemesis, Hematochezia, Melena, Change in Bowel Habits,  Loss of Appetite  GU: No Dysuria, No Change in Color of Urine, No Urgency or Urinary Frequency, No Flank pain.  Musculoskeletal: No Joint Pain or Swelling, No Decreased Range of Motion, No Back Pain.  Neurologic: No Syncope, No Seizures, Muscle Weakness, Paresthesia, +Chronic Vision Loss in Left eye,  , No Diplopia, No Vertigo, No Difficulty Walking,  Skin: No Rash or Lesions. Psych: No Change in Mood or Affect, No Depression or Anxiety, No Memory loss, No Confusion, or Hallucinations   Past Medical History  Diagnosis Date  . Wegener's granulomatosis   . OA  (osteoarthritis)   . Blindness of one eye   . Hearing loss in left ear     hearing aid both ears  . Bladder incontinence   . Glaucoma   . Clotting disorder   . History of pulmonary embolism 1997  . Shortness of breath dyspnea   . Pneumonia 12/15  . Multiple allergies   . Chronic sinusitis      Past Surgical History  Procedure Laterality Date  . Tubal ligation    . Pubovaginal sling    . Cataract extraction    . Melanoma excision      left leg  . Vena cava filter placement  1997    [place] filter  . Knee arthroscopy  2006    rt   . Inguinal hernia repair Left 02/17/2014    Procedure: LEFT INGUINAL HERNIA REPAIR WITH MESH;  Surgeon: Harl Bowie, MD;  Location: [facility];  Service: General;  Laterality: Left;  . Insertion of mesh N/A 02/17/2014    Procedure: INSERTION OF MESH;  Surgeon: Harl Bowie, MD;  Location: [facility];  Service: General;  Laterality: N/A;      Prior to Admission medications   Medication Sig Start Date End Date Taking? Authorizing Provider  acetaminophen (TYLENOL) 325 MG tablet Take 2 tablets (650 mg total) by mouth every 6 (six) hours as needed for mild pain (or Fever >/= 101). 07/30/14   Reyne Dumas, MD  ADVAIR DISKUS 500-50 MCG/DOSE AEPB INHALE 1 PUFF INTO LUNGS EVERY 12 HOURS    Kathee Delton, MD  b complex vitamins capsule Take  1 capsule by mouth daily.    Historical Provider, MD  benzonatate (TESSALON) 200 MG capsule Take 200 mg by mouth 2 (two) times daily as needed.  07/16/14   Historical Provider, MD  calcium-vitamin D (OSCAL WITH D) 500-200 MG-UNIT per tablet Take 1 tablet by mouth daily with breakfast.    Historical Provider, MD  cholecalciferol (VITAMIN D) 1000 UNITS tablet One daily for vitamin D supplement 08/10/14   Estill Dooms, MD  fluticasone (FLONASE) 50 MCG/ACT nasal spray Place 1 spray into both nostrils as needed.  06/24/14   Historical Provider, MD  folic acid (FOLVITE) 1 MG tablet Take  1 tablet (1 mg total) by mouth daily. 07/30/14   Reyne Dumas, MD  furosemide (LASIX) 40 MG tablet One daily to prevent fluid retention 08/10/14   Estill Dooms, MD  latanoprost (XALATAN) 0.005 % ophthalmic solution Place 1 drop into both eyes at bedtime.    Historical Provider, MD  levalbuterol Penne Lash) 0.63 MG/3ML nebulizer solution Take 3 mLs (0.63 mg total) by nebulization every 6 (six) hours as needed for wheezing or shortness of breath. 07/30/14   Reyne Dumas, MD  LORazepam (ATIVAN) 0.5 MG tablet Take 1 tablet (0.5 mg total) by mouth every 6 (six) hours as needed for anxiety. 07/30/14   Reyne Dumas, MD  MELATONIN PO Take 1-3 tablets by mouth at bedtime as needed (for sleep).     Historical Provider, MD  Multiple Vitamins-Minerals (MULTIVITAMIN WITH MINERALS) tablet Take 1 tablet by mouth daily.    Historical Provider, MD  NU-IRON 150 MG capsule Take 150 mg by mouth daily. 07/08/14   Historical Provider, MD  pantoprazole (PROTONIX) 40 MG tablet Take 1 tablet (40 mg total) by mouth 2 (two) times daily. 07/30/14   Reyne Dumas, MD  polyethylene glycol (MIRALAX / GLYCOLAX) packet Take 17 g by mouth daily. 07/30/14   Reyne Dumas, MD  predniSONE (DELTASONE) 5 MG tablet As directed    Historical Provider, MD  sulfamethoxazole-trimethoprim (BACTRIM DS,SEPTRA DS) 800-160 MG per tablet 1 tablet every M,W,F 06/12/14   Historical Provider, MD  timolol (BETIMOL) 0.5 % ophthalmic solution Place 1 drop into the left eye daily with breakfast.     Historical Provider, MD  warfarin (COUMADIN) 2.5 MG tablet Take 2.5-3.75 mg by mouth See admin instructions. Takes 2.5mg  everyday except 3.75 on Monday, Wednesday, and Friday    Historical Provider, MD  zoledronic acid (RECLAST) 5 MG/100ML SOLN Inject 5 mg into the vein once. yearly    Historical Provider, MD     Allergies  Allergen Reactions  . Adhesive [Tape] Rash    Social History:  reports that she quit smoking about 30 years ago. Her smoking use included  Cigarettes. She has a 45 pack-year smoking history. She does not have any smokeless tobacco history on file. She reports that she drinks alcohol. She reports that she does not use illicit drugs.    Family History  Problem Relation Age of Onset  . Heart disease Father     stroke-deceased  . Stroke Father   . Congestive Heart Failure Mother     deceased - CHF  . Heart disease Mother   . Uterine cancer Sister   . Lymphoma Sister   . Skin cancer Sister     no melanoma       Physical Exam:  GEN:  Pleasant Elderly Frail appearing  81 y.o. Caucasian   female examined and in no acute distress; cooperative with exam Filed Vitals:  08/14/14 2257 08/14/14 2300 08/14/14 2302 08/14/14 2342  BP: 105/59 105/59 109/50 111/58  Pulse:  88 84 83  Temp:      TempSrc:      Resp: 18 16 16 20   SpO2: 100% 100% 99% 94%   Blood pressure 111/58, pulse 83, temperature 97.6 F (36.4 C), temperature source Axillary, resp. rate 20, SpO2 94 %. PSYCH: She is alert and oriented x4; does not appear anxious does not appear depressed; affect is normal HEENT: Normocephalic and Atraumatic, Mucous membranes pink; PERRLA; EOM intact; Fundi:  Benign;  No scleral icterus, Nares: Patent, Oropharynx: Clear,     Neck:  FROM, No Cervical Lymphadenopathy nor Thyromegaly or Carotid Bruit; No JVD; Breasts:: Not examined CHEST WALL: No tenderness CHEST: Normal respiration, clear to auscultation bilaterally HEART: Regular rate and rhythm; no murmurs rubs or gallops BACK: No kyphosis or scoliosis; No CVA tenderness ABDOMEN: Positive Bowel Sounds, Soft Non-Tender, No Rebound or Guarding; No Masses, No Organomegaly Rectal Exam: Not done EXTREMITIES: No Cyanosis, Clubbing, or Edema; + surgical incision on plantar Surface of the base of the 5th Toe.   The area is clean dry and without signs of Infection Genitalia: not examined PULSES: 2+ and symmetric SKIN: Normal hydration no rash or ulceration CNS:  Alert and Oriented x 4,  No Acute Focal Deficits  Has Chronic Left Eye Blindness and Right Ear Deafness.     Vascular: pulses palpable throughout    Labs on Admission:  Basic Metabolic Panel:  Recent Labs Lab 08/14/14 2105  NA 137  K 4.3  CL 101  GLUCOSE 263*  BUN 36*  CREATININE 1.40*   Liver Function Tests: No results for input(s): AST, ALT, ALKPHOS, BILITOT, PROT, ALBUMIN in the last 168 hours. No results for input(s): LIPASE, AMYLASE in the last 168 hours. No results for input(s): AMMONIA in the last 168 hours. CBC:  Recent Labs Lab 08/10/14 1539 08/14/14 2050 08/14/14 2105  WBC 10.0 10.4  --   NEUTROABS 9.5*  --   --   HGB 11.3* 13.6 15.0  HCT 35.7 42.4 44.0  MCV 86 86.5  --   PLT 232 318  --    Cardiac Enzymes: No results for input(s): CKTOTAL, CKMB, CKMBINDEX, TROPONINI in the last 168 hours.  BNP (last 3 results)  Recent Labs  07/20/14 1320 08/14/14 2050  BNP 210.5* 216.7*    ProBNP (last 3 results) No results for input(s): PROBNP in the last 8760 hours.  CBG: No results for input(s): GLUCAP in the last 168 hours.  Radiological Exams on Admission: Dg Chest Portable 1 View  08/14/2014   CLINICAL DATA:  Respiratory distress tonight, hypertension. History of CHF and pulmonary embolism.  EXAM: PORTABLE CHEST - 1 VIEW  COMPARISON:  Chest radiograph July 24, 2014  FINDINGS: The cardiac silhouette is upper limits of normal in size, mediastinal silhouette is nonsuspicious. Mildly calcified aortic knob. Mild chronic interstitial changes with improved aeration lung bases from prior imaging. Strandy densities without pleural effusions or focal consolidation. No pneumothorax. Soft tissue planes and included osseous structures are nonsuspicious. Thoracic levoscoliosis and degenerative change.  IMPRESSION: Borderline cardiomegaly and mild chronic interstitial changes with bibasilar atelectasis/scarring. Improved aeration lungs from prior imaging.   Electronically Signed   By: Elon Alas   On: 08/14/2014 21:29     EKG: Independently reviewed.    Assessment/Plan:   81 y.o. female with  Principal Problem:   1.    Acute respiratory distress- Due to Mucus Plug  Duonebs   And Robitussin for Expectorant Rx    D/C Cough Supressant Rx   O2 PRN   Active Problems:   2.   AKI (acute kidney injury)   Gentle IVFs   Monitor BUN/CR.       3.   WEGENERS GRANULOMATOSIS/Intrinsic asthma   On Steroid Rx   Advair  And Albuterol and Xoponex Nebs at home   O2    Monitor O2 sats.       4.    Long term current use of anticoagulant therapy- due to PE   On Coumadin Rx   Monitor Pt/INR    Adjust PRN   5.    Vitamin D deficiency   Continue Vitamin D Supplement      6.    DVT Prophylaxis    On Coumadin Rx      Code Status:   FULL CODE    Family Communication:    No Family Present  Disposition Plan:   Observation Stepdown Monitoring      Time spent:   Theressa Millard [facility] Pager [phone]   If 7AM -7PM Please Contact the Day Rounding Team MD for [facility]  If 7PM-7AM, Please Contact Night-Floor Coverage  [email or web address] Password [email or web address] 08/15/2014, 1:17 AM     ADDENDUM:   Patient was seen and examined on 08/15/2014

## 2014-08-15 NOTE — Progress Notes (Addendum)
Patient Demographics  [name], is a 81 y.o. female, DOB - 04-02-1933, FBP:[id number]  Admit date - 08/14/2014   Admitting Physician Theressa Millard, MD  Outpatient Primary MD for the patient is  [name], MD  LOS - 1   Chief Complaint  Patient presents with  . Respiratory Distress      [place] history of present illness/brief narrative: [name] is a 81 y.o. female with a history of Wegener's Granulomatosis,Asthma, and PE on Coumadin Rx who presents to the ED with complaints of worsening SOB over the past days. EMS placed her on CPAP on route the ED. In the ED, she was administered nebulizer treatments and received pulmonary toilet and expectorated a large mucus plug and began to improve. She was referred for observation and monitoring. She denied having any fevers or chills. Chest x-ray does not show an acute opacity or infiltrate, patient reports significant nasal discharge, postnasal drip, during previous admission patient required intubation for airway protection where she was found to have nasopharyngeal mass/mucous plug extending all the way to subglottic area, ulcers then were negative for fungus, and pathology significant for mucus and inflammatory cells.     Subjective:   [name] today has, No headache, No chest pain, No abdominal pain - No Nausea, No new weakness tingling or numbness, reports significant improvement of shortness of breath, still reports postnasal drip, and nasal discharge.  Assessment & Plan    Principal Problem:   Acute respiratory distress Active Problems:   WEGENERS GRANULOMATOSIS   Intrinsic asthma   Long term current use of anticoagulant therapy   Vitamin D deficiency   AKI (acute kidney injury)  Acute respiratory distress/ due to mucus plugging - This is most likely related to mucous plugging, it  resembles previous episode last admission. - Recent admission with a similar presentation, with nasopharyngeal mass/mucous plug extending all the way to subglottic area, required intubation then, culture negative for fungus. - Discussed with ENT covering Dr. Lucia Gaskins ( evaluated patient during last admission)  , possibly patient had atrophic rhinitis, goal is to keep secretions moist as possible. - Start on Mucinex, a modifier with oxygen whenever she needs oxygen, normal saline nasal spray, vaporizer. - We'll start on ROCEPHIN for recurrent sinusitis, will send nasal swab for culture.  Acute kidney injury - Hold Lasix, continue with gentle IV fluids  Wegener's granulomatosis/intrinsic asthma - Continue with low-dose prednisone - On Advair, albuterol, Xopenex at home  PE - Continue with warfarin, dosed by pharmacy  Vitamin D deficiency - Continue with vitamin D supplement       Code Status: Full  Family Communication: None at bedside  Disposition Plan: Remains on step down   Procedures  None   Consults   None   Medications  Scheduled Meds: . B-complex with vitamin C  1 tablet Oral Daily  . calcium-vitamin D  1 tablet Oral Q breakfast  . cholecalciferol  1,000 Units Oral Daily  . fluticasone  1 spray Each Nare Daily  . folic acid  1 mg Oral Daily  . ipratropium-albuterol  3 mL Nebulization Q4H  . latanoprost  1 drop Both Eyes QHS  . multivitamin with minerals  1 tablet Oral Daily  . pantoprazole  40 mg Oral BID  . polyethylene glycol  17 g Oral Daily  . predniSONE  5 mg Oral Q breakfast  . sodium chloride  3 mL Intravenous Q12H  . [START ON 08/17/2014] sulfamethoxazole-trimethoprim  1 tablet Oral Once per day on Mon Wed Fri  . timolol  1 drop Left Eye Q breakfast  . Warfarin - Pharmacist Dosing Inpatient   Does not apply q1800   Continuous Infusions: . sodium chloride     PRN Meds:.sodium chloride, acetaminophen **OR** acetaminophen, alum & mag  hydroxide-simeth, guaiFENesin, HYDROmorphone (DILAUDID) injection, LORazepam, ondansetron **OR** ondansetron (ZOFRAN) IV, oxyCODONE, sodium chloride  DVT Prophylaxis  on warfarin  Lab Results  Component Value Date   PLT 264 08/15/2014    Antibiotics    Anti-infectives    Start     Dose/Rate Route Frequency Ordered Stop   08/17/14 0900  sulfamethoxazole-trimethoprim (BACTRIM DS,SEPTRA DS) 800-160 MG per tablet 1 tablet     1 tablet Oral Once per day on Mon Wed Fri 08/15/14 0332            Objective:   Filed Vitals:   08/15/14 0328 08/15/14 0415 08/15/14 0743 08/15/14 0800  BP: 137/63 108/79  111/62  Pulse:  74 77 68  Temp: 97.7 F (36.5 C) 97.6 F (36.4 C)  97.8 F (36.6 C)  TempSrc:  Oral  Oral  Resp: 21 21  18   Height: 5\' 1"  (1.549 m)     Weight: 52.1 kg (114 lb 13.8 oz)     SpO2: 99% 95% 97% 98%    Wt Readings from Last 3 Encounters:  08/15/14 52.1 kg (114 lb 13.8 oz)  08/10/14 54.432 kg (120 lb)  08/10/14 57.471 kg (126 lb 11.2 oz)     Intake/Output Summary (Last 24 hours) at 08/15/14 1038 Last data filed at 08/15/14 1000  Gross per 24 hour  Intake    905 ml  Output      0 ml  Net    905 ml     Physical Exam  Awake Alert, Oriented X 3, No new F.N deficits, Normal affect, extremely hard of hearing [place].AT,PERRAL Supple Neck,No JVD, No cervical lymphadenopathy appriciated.  Symmetrical Chest wall movement, Good air movement bilaterally, CTAB RRR,No Gallops,Rubs or new Murmurs, No Parasternal Heave +ve B.Sounds, Abd Soft, No tenderness, No organomegaly appriciated, No rebound - guarding or rigidity. No Cyanosis, Clubbing or edema, No new Rash or bruise     Data Review   Micro Results No results found for this or any previous visit (from the past 240 hour(s)).  Radiology Reports Dg Chest Portable 1 View  08/14/2014   CLINICAL DATA:  Respiratory distress tonight, hypertension. History of CHF and pulmonary embolism.  EXAM: PORTABLE CHEST - 1 VIEW   COMPARISON:  Chest radiograph July 24, 2014  FINDINGS: The cardiac silhouette is upper limits of normal in size, mediastinal silhouette is nonsuspicious. Mildly calcified aortic knob. Mild chronic interstitial changes with improved aeration lung bases from prior imaging. Strandy densities without pleural effusions or focal consolidation. No pneumothorax. Soft tissue planes and included osseous structures are nonsuspicious. Thoracic levoscoliosis and degenerative change.  IMPRESSION: Borderline cardiomegaly and mild chronic interstitial changes with bibasilar atelectasis/scarring. Improved aeration lungs from prior imaging.   Electronically Signed   By: Elon Alas   On: 08/14/2014 21:29    CBC  Recent Labs Lab 08/10/14 1539 08/14/14 2050 08/14/14 2105 08/15/14 0537  WBC 10.0 10.4  --  9.6  HGB 11.3* 13.6 15.0  11.1*  HCT 35.7 42.4 44.0 35.1*  PLT 232 318  --  264  MCV 86 86.5  --  87.1  MCH 27.3 27.8  --  27.5  MCHC 31.7* 32.1  --  31.6  RDW 28.3* 27.7*  --  27.5*  LYMPHSABS 0.3*  --   --   --   EOSABS 0.0  --   --   --   BASOSABS 0.0  --   --   --     Chemistries   Recent Labs Lab 08/14/14 2105 08/15/14 0537  NA 137 139  K 4.3 3.5  CL 101 103  CO2  --  28  GLUCOSE 263* 95  BUN 36* 30*  CREATININE 1.40* 1.14*  CALCIUM  --  8.9   ------------------------------------------------------------------------------------------------------------------ estimated creatinine clearance is 30.2 mL/min (by C-G formula based on Cr of 1.14). ------------------------------------------------------------------------------------------------------------------ No results for input(s): HGBA1C in the last 72 hours. ------------------------------------------------------------------------------------------------------------------ No results for input(s): CHOL, HDL, LDLCALC, TRIG, CHOLHDL, LDLDIRECT in the last 72  hours. ------------------------------------------------------------------------------------------------------------------ No results for input(s): TSH, T4TOTAL, T3FREE, THYROIDAB in the last 72 hours.  Invalid input(s): FREET3 ------------------------------------------------------------------------------------------------------------------ No results for input(s): VITAMINB12, FOLATE, FERRITIN, TIBC, IRON, RETICCTPCT in the last 72 hours.  Coagulation profile  Recent Labs Lab 08/14/14 2050 08/15/14 0537  INR 1.80* 2.01*    No results for input(s): DDIMER in the last 72 hours.  Cardiac Enzymes No results for input(s): CKMB, TROPONINI, MYOGLOBIN in the last 168 hours.  Invalid input(s): CK ------------------------------------------------------------------------------------------------------------------ Invalid input(s): POCBNP     Time Spent in minutes   35 minutes   ELGERGAWY, DAWOOD M.D on 08/15/2014 at 10:38 AM  Between 7am to 7pm - Pager - [phone]  After 7pm go to [email or web address] - password [email or web address]  And look for the night coverage person covering for me after hours  [facility] Office  [phone]   **Disclaimer: This note may have been dictated with voice recognition software. Similar sounding words can inadvertently be transcribed and this note may contain transcription errors which may not have been corrected upon publication of note.**

## 2014-08-16 ENCOUNTER — Encounter (HOSPITAL_COMMUNITY): Payer: Self-pay | Admitting: Emergency Medicine

## 2014-08-16 DIAGNOSIS — M3130 Wegener's granulomatosis without renal involvement: Secondary | ICD-10-CM

## 2014-08-16 DIAGNOSIS — J4520 Mild intermittent asthma, uncomplicated: Secondary | ICD-10-CM

## 2014-08-16 LAB — BASIC METABOLIC PANEL
Anion gap: 10 (ref 5–15)
BUN: 27 mg/dL — ABNORMAL HIGH (ref 6–23)
CHLORIDE: 105 mmol/L (ref 96–112)
CO2: 25 mmol/L (ref 19–32)
Calcium: 9.6 mg/dL (ref 8.4–10.5)
Creatinine, Ser: 0.94 mg/dL (ref 0.50–1.10)
GFR calc Af Amer: 65 mL/min — ABNORMAL LOW (ref >90)
GFR calc non Af Amer: 56 mL/min — ABNORMAL LOW (ref >90)
Glucose, Bld: 88 mg/dL (ref 70–99)
POTASSIUM: 3.7 mmol/L (ref 3.5–5.1)
Sodium: 140 mmol/L (ref 135–145)

## 2014-08-16 LAB — CBC
HCT: 34.9 % — ABNORMAL LOW (ref 36.0–46.0)
Hemoglobin: 10.9 g/dL — ABNORMAL LOW (ref 12.0–15.0)
MCH: 27.5 pg (ref 26.0–34.0)
MCHC: 31.2 g/dL (ref 30.0–36.0)
MCV: 88.1 fL (ref 78.0–100.0)
PLATELETS: 244 10*3/uL (ref 150–400)
RBC: 3.96 MIL/uL (ref 3.87–5.11)
RDW: 27.8 % — ABNORMAL HIGH (ref 11.5–15.5)
WBC: 6.5 10*3/uL (ref 4.0–10.5)

## 2014-08-16 LAB — PROTIME-INR
INR: 2.09 — AB (ref 0.00–1.49)
Prothrombin Time: 23.7 seconds — ABNORMAL HIGH (ref 11.6–15.2)

## 2014-08-16 MED ORDER — MENTHOL 3 MG MT LOZG
1.0000 | LOZENGE | OROMUCOSAL | Status: DC | PRN
  Filled 2014-08-16: qty 9

## 2014-08-16 MED ORDER — LEVALBUTEROL HCL 0.63 MG/3ML IN NEBU
0.6300 mg | INHALATION_SOLUTION | RESPIRATORY_TRACT | Status: DC | PRN
  Administered 2014-08-16 – 2014-08-17 (×2): 0.63 mg via RESPIRATORY_TRACT
  Filled 2014-08-16: qty 3

## 2014-08-16 MED ORDER — MOMETASONE FURO-FORMOTEROL FUM 200-5 MCG/ACT IN AERO
2.0000 | INHALATION_SPRAY | Freq: Two times a day (BID) | RESPIRATORY_TRACT | Status: DC
  Administered 2014-08-17 – 2014-08-18 (×3): 2 via RESPIRATORY_TRACT
  Filled 2014-08-16: qty 8.8

## 2014-08-16 MED ORDER — SACCHAROMYCES BOULARDII 250 MG PO CAPS
250.0000 mg | ORAL_CAPSULE | Freq: Two times a day (BID) | ORAL | Status: DC
  Administered 2014-08-16 – 2014-08-18 (×5): 250 mg via ORAL
  Filled 2014-08-16 (×7): qty 1

## 2014-08-16 MED ORDER — WARFARIN SODIUM 3 MG PO TABS
3.0000 mg | ORAL_TABLET | Freq: Once | ORAL | Status: AC
  Administered 2014-08-16: 3 mg via ORAL
  Filled 2014-08-16 (×2): qty 1

## 2014-08-16 NOTE — Evaluation (Signed)
Physical Therapy Evaluation Patient Details Name: [name] MRN: [id number] DOB: September 26, 1932 Today's Date: 08/16/2014   History of Present Illness  [name] is a 81 y.o. female with a history of Wegener's Granulomatosis,Asthma, and PE on Coumadin Rx who presents to the ED with complaints of worsening SOB over the past days. Found to have nasopharyngeal mass/mucous plug extending all the way to subglottic area.  Clinical Impression  Pt admitted with the above diagnosis. Pt currently with functional limitations due to the deficits listed below (see PT Problem List). Ambulates up to 85 feet x2 today with min guard assist while using a rolling walker for support, with 3/4 dyspnea. States she has been progressing well with physical therapy at SNF. SpO2 ranged from 92-95% throughout therapy session on room air.  Pt will benefit from skilled PT to increase their independence and safety with mobility to allow discharge to the venue listed below.       Follow Up Recommendations SNF    Equipment Recommendations  None recommended by PT    Recommendations for Other Services       Precautions / Restrictions Precautions Precautions: Fall Restrictions Weight Bearing Restrictions: No      Mobility  Bed Mobility Overal bed mobility: Needs Assistance Bed Mobility: Supine to Sit;Sit to Supine     Supine to sit: Supervision Sit to supine: Supervision   General bed mobility comments: Supervision for safety. Did not require physical assist.   Transfers Overall transfer level: Needs assistance Equipment used: Rolling walker (2 wheeled) Transfers: Sit to/from Stand Sit to Stand: Supervision Stand pivot transfers: Min guard       General transfer comment: Supervision for safety with cues for hand placement for sit<>stand transfers. Min guard for safety without assistive device during pivot from chair to bed.  Ambulation/Gait Ambulation/Gait assistance: Min guard Ambulation  Distance (Feet): 85 Feet (x2) Assistive device: Rolling walker (2 wheeled) Gait Pattern/deviations: Step-through pattern;Decreased stride length;Trunk flexed Gait velocity: decreased   General Gait Details: Cues for upright posture intermittently. No loss of balance while using a rolling walker for stability. 3/4 dyspnea with SpO2 91-94% on room air throughout ambulatory bout (better with cues for pursed lip breathing.) Required one standing rest break between distances.   Stairs            Wheelchair Mobility    Modified Rankin (Stroke Patients Only)       Balance Overall balance assessment: Needs assistance Sitting-balance support: No upper extremity supported Sitting balance-Leahy Scale: Good     Standing balance support: No upper extremity supported Standing balance-Leahy Scale: Fair                               Pertinent Vitals/Pain Pain Assessment: No/denies pain    Home Living Family/patient expects to be discharged to:: Skilled nursing facility Living Arrangements: Spouse/significant other   Type of Home: Apartment Home Access: Level entry     Home Layout: One level Home Equipment: Cane - single point;Shower seat - built in Additional Comments: Has been staying at [facility] for past year    Prior Function Level of Independence: Needs assistance   Gait / Transfers Assistance Needed: walker for ambulation at SNF  ADL's / Homemaking Assistance Needed: States she can dress herself, but has assist to bath at SNF        Hand Dominance   Dominant Hand: Right    Extremity/Trunk Assessment   Upper  Extremity Assessment: Defer to OT evaluation           Lower Extremity Assessment: Generalized weakness         Communication   Communication: HOH;Expressive difficulties (low voice)  Cognition Arousal/Alertness: Awake/alert Behavior During Therapy: WFL for tasks assessed/performed Overall Cognitive Status: Within Functional Limits for  tasks assessed                      General Comments General comments (skin integrity, edema, etc.): Pt found to have been incontinent (urinary) in bed.     Exercises        Assessment/Plan    PT Assessment Patient needs continued PT services  PT Diagnosis Difficulty walking;Generalized weakness   PT Problem List Decreased strength;Decreased activity tolerance;Decreased balance;Decreased mobility;Decreased knowledge of use of DME;Cardiopulmonary status limiting activity  PT Treatment Interventions DME instruction;Gait training;Functional mobility training;Therapeutic activities;Therapeutic exercise;Balance training;Neuromuscular re-education;Patient/family education   PT Goals (Current goals can be found in the Care Plan section) Acute Rehab PT Goals Patient Stated Goal: Go back to SNF PT Goal Formulation: With patient Time For Goal Achievement: 08/30/14 Potential to Achieve Goals: Good    Frequency Min 2X/week   Barriers to discharge        Co-evaluation               End of Session Equipment Utilized During Treatment: Gait belt Activity Tolerance: Patient tolerated treatment well Patient left: in bed;with call bell/phone within reach;with family/visitor present;with nursing/sitter in room Nurse Communication: Mobility status         Time: 6384-5364 PT Time Calculation (min) (ACUTE ONLY): 24 min   Charges:   PT Evaluation $Initial PT Evaluation Tier I: 1 Procedure PT Treatments $Gait Training: 8-22 mins   PT G CodesEllouise Newer 08/16/2014, 4:58 PM Camille [place], [place]

## 2014-08-16 NOTE — Progress Notes (Signed)
HO Sullivan paged, pt states breathing has not improved since last treatment, asked for more frequent intervention.

## 2014-08-16 NOTE — Progress Notes (Signed)
NURSING PROGRESS NOTE  [name] [id number] Admission Data: 08/16/2014 12:10 PM Attending Provider: Delfina Redwood, MD PCP: [name], MD Code Status: Full  Allergies:  Adhesive Past Medical History:   has a past medical history of Wegener's granulomatosis; OA (osteoarthritis); Blindness of one eye; Hearing loss in left ear; Bladder incontinence; Glaucoma; Clotting disorder; History of pulmonary embolism (1997); Shortness of breath dyspnea; Pneumonia (12/15); Multiple allergies; and Chronic sinusitis. Past Surgical History:   has past surgical history that includes Tubal ligation; Pubovaginal sling; Cataract extraction; Melanoma excision; Vena cava filter placement (1997); Knee arthroscopy (2006); Inguinal hernia repair (Left, 02/17/2014); and Insertion of mesh (N/A, 02/17/2014). Social History:   reports that she quit smoking about 30 years ago. Her smoking use included Cigarettes. She has a 45 pack-year smoking history. She does not have any smokeless tobacco history on file. She reports that she drinks alcohol. She reports that she does not use illicit drugs.  [name] is a 81 y.o. female patient admitted from ED:   Last Documented Vital Signs: Blood pressure 144/52, pulse 82, temperature 97.7 F (36.5 C), temperature source Oral, resp. rate 20, height 5\' 1"  (1.549 m), weight 53.4 kg (117 lb 11.6 oz), SpO2 100 %.  Cardiac Monitoring:  IV Fluids:  IV in place, occlusive dsg intact without redness, IV cath antecubital right, condition patent and no redness  none.   Skin: WDL  Patient/Family orientated to room. Information packet given to patient/family. Admission inpatient armband information verified with patient/family to include name and date of birth and placed on patient arm. Side rails up x 2, fall assessment and education completed with patient/family. Patient/family able to verbalize understanding of risk associated with falls and verbalized understanding to call for  assistance before getting out of bed. Call light within reach. Patient/family able to voice and demonstrate understanding of unit orientation instructions.    Will continue to evaluate and treat per MD orders.   Hendricks Limes RN, BS, BSN

## 2014-08-16 NOTE — Progress Notes (Signed)
ANTICOAGULATION CONSULT NOTE - Initial Consult  Pharmacy Consult for Coumadin Indication: h/o PE and clotting d/o  Allergies  Allergen Reactions  . Adhesive [Tape] Rash    Vital Signs: Temp: 97.7 F (36.5 C) (02/14 1105) Temp Source: Oral (02/14 1105) BP: 144/52 mmHg (02/14 1105) Pulse Rate: 82 (02/14 1105)  Labs:  Recent Labs  08/14/14 2050 08/14/14 2105 08/15/14 0537 08/16/14 0341  HGB 13.6 15.0 11.1* 10.9*  HCT 42.4 44.0 35.1* 34.9*  PLT 318  --  264 244  LABPROT 21.0*  --  23.0* 23.7*  INR 1.80*  --  2.01* 2.09*  CREATININE  --  1.40* 1.14* 0.94    Estimated Creatinine Clearance: 36.6 mL/min (by C-G formula based on Cr of 0.94).   Medical History: Past Medical History  Diagnosis Date  . Wegener's granulomatosis   . OA (osteoarthritis)   . Blindness of one eye   . Hearing loss in left ear     hearing aid both ears  . Bladder incontinence   . Glaucoma   . Clotting disorder   . History of pulmonary embolism 1997  . Shortness of breath dyspnea   . Pneumonia 12/15  . Multiple allergies   . Chronic sinusitis      Assessment: 81yo female presents w/ acute respiratory distress and known CHF, to continue Coumadin for h/o PE w/ clotting disorder. INR 2.09.  PTA warfarin dose: 2.5 mg po daily   Goal of Therapy:  INR 2-3    Plan -Warfarin 3 mg po x1 -Daily INR   Hughes Better, PharmD, BCPS Clinical Pharmacist Pager: [phone] 08/16/2014 12:11 PM

## 2014-08-16 NOTE — Progress Notes (Signed)
Patient Demographics  [name], is a 81 y.o. female, DOB - May 07, 1933, KTG:[id number]  Admit date - 08/14/2014   Admitting Physician Theressa Millard, MD  Outpatient Primary MD for the patient is  [name], MD  LOS - 2   Chief Complaint  Patient presents with  . Respiratory Distress      Mission history of present illness/brief narrative: [name] is a 81 y.o. female with a history of Wegener's Granulomatosis,Asthma, and PE on Coumadin Rx who presents to the ED with complaints of worsening SOB over the past days. EMS placed her on CPAP on route the ED. In the ED, she was administered nebulizer treatments and received pulmonary toilet and expectorated a large mucus plug and began to improve. She was referred for observation and monitoring. She denied having any fevers or chills. Chest x-ray does not show an acute opacity or infiltrate, patient reports significant nasal discharge, postnasal drip, during previous admission patient required intubation for airway protection where she was found to have nasopharyngeal mass/mucous plug extending all the way to subglottic area, ulcers then were negative for fungus, and pathology significant for mucus and inflammatory cells.   Assessment & Plan   Acute respiratory distress/ due to mucus plugging resolved - Discussed with ENT covering Dr. Lucia Gaskins ( evaluated patient during last admission)  , possibly patient had atrophic rhinitis, goal is to keep secretions moist as possible. Continue mucolytics, flonase and saline nasal, rocephin. Likely change to augmentin tomorrow. Transfer to floor.  Acute kidney injury Resolved.  D/c IVF. Hold lasix for now  Wegener's granulomatosis/intrinsic asthma - Continue with low-dose prednisone Resume advair, continue nebs.  PE - Continue with warfarin, dosed by  pharmacy  Vitamin D deficiency - Continue with vitamin D supplement  PT eval  Code Status: Full  Family Communication: SIL at bedside  Disposition Plan: to floor. Home 1-2 days if stable   Procedures  None   Consults   None   Medications  Scheduled Meds: . acidophilus  2 capsule Oral Daily  . B-complex with vitamin C  1 tablet Oral Daily  . calcium-vitamin D  1 tablet Oral Q breakfast  . cefTRIAXone (ROCEPHIN)  IV  1 g Intravenous Q24H  . cholecalciferol  1,000 Units Oral Daily  . fluticasone  1 spray Each Nare Daily  . folic acid  1 mg Oral Daily  . guaiFENesin  600 mg Oral BID  . ipratropium-albuterol  3 mL Nebulization TID  . latanoprost  1 drop Both Eyes QHS  . multivitamin with minerals  1 tablet Oral Daily  . pantoprazole  40 mg Oral BID  . polyethylene glycol  17 g Oral Daily  . predniSONE  5 mg Oral Q breakfast  . sodium chloride  1 spray Each Nare Q3H  . sodium chloride  3 mL Intravenous Q12H  . [START ON 08/17/2014] sulfamethoxazole-trimethoprim  1 tablet Oral Once per day on Mon Wed Fri  . timolol  1 drop Left Eye Q breakfast  . Warfarin - Pharmacist Dosing Inpatient   Does not apply q1800   Continuous Infusions:   PRN Meds:.sodium chloride, acetaminophen **OR** acetaminophen, alum & mag hydroxide-simeth, guaiFENesin, HYDROmorphone (DILAUDID) injection, LORazepam, menthol-cetylpyridinium, ondansetron **OR** ondansetron (ZOFRAN) IV, oxyCODONE,  sodium chloride  DVT Prophylaxis  on warfarin  Lab Results  Component Value Date   PLT 244 08/16/2014    Antibiotics    Anti-infectives    Start     Dose/Rate Route Frequency Ordered Stop   08/17/14 0900  sulfamethoxazole-trimethoprim (BACTRIM DS,SEPTRA DS) 800-160 MG per tablet 1 tablet     1 tablet Oral Once per day on Mon Wed Fri 08/15/14 0332     08/15/14 1200  cefTRIAXone (ROCEPHIN) 1 g in dextrose 5 % 50 mL IVPB - Premix     1 g 100 mL/hr over 30 Minutes Intravenous Every 24 hours 08/15/14 1111        Subjective:   C/o hoarseness. No dyspnea. Eating ok. Wants to get OOB and walk around. Chronic sinusitis with postnasal drip. SIL asking for saline wash   Objective:   Filed Vitals:   08/16/14 0200 08/16/14 0400 08/16/14 0600 08/16/14 0819  BP: 101/44 106/50 116/56 112/48  Pulse: 71 72 81 93  Temp:  97.6 F (36.4 C)  97.9 F (36.6 C)  TempSrc:  Oral  Oral  Resp: 18 22 21 24   Height:      Weight:      SpO2: 97% 100% 99% 99%    Wt Readings from Last 3 Encounters:  08/16/14 53.4 kg (117 lb 11.6 oz)  08/10/14 54.432 kg (120 lb)  08/10/14 57.471 kg (126 lb 11.2 oz)     Intake/Output Summary (Last 24 hours) at 08/16/14 0832 Last data filed at 08/15/14 2210  Gross per 24 hour  Intake    758 ml  Output    950 ml  Net   -192 ml     Physical Exam  Awake Alert, Oriented X 3, HOH. whisper HEENT: minimal maxillary sinus tenderness. OP without erythema or exudate. No thrush. MMM Supple Neck,No JVD, No cervical lymphadenopathy appriciated.  Symmetrical Chest wall movement, Good air movement bilaterally, CTAB without WRR RRR,No Gallops,Rubs or new Murmurs, No Parasternal Heave +ve B.Sounds, Abd Soft, No tenderness, No organomegaly appriciated, No rebound - guarding or rigidity. No Cyanosis, Clubbing or edema, No new Rash or bruise    Data Review   Micro Results No results found for this or any previous visit (from the past 240 hour(s)).  Radiology Reports Dg Chest Portable 1 View  08/14/2014   CLINICAL DATA:  Respiratory distress tonight, hypertension. History of CHF and pulmonary embolism.  EXAM: PORTABLE CHEST - 1 VIEW  COMPARISON:  Chest radiograph July 24, 2014  FINDINGS: The cardiac silhouette is upper limits of normal in size, mediastinal silhouette is nonsuspicious. Mildly calcified aortic knob. Mild chronic interstitial changes with improved aeration lung bases from prior imaging. Strandy densities without pleural effusions or focal consolidation. No pneumothorax.  Soft tissue planes and included osseous structures are nonsuspicious. Thoracic levoscoliosis and degenerative change.  IMPRESSION: Borderline cardiomegaly and mild chronic interstitial changes with bibasilar atelectasis/scarring. Improved aeration lungs from prior imaging.   Electronically Signed   By: Elon Alas   On: 08/14/2014 21:29    CBC  Recent Labs Lab 08/10/14 1539 08/14/14 2050 08/14/14 2105 08/15/14 0537 08/16/14 0341  WBC 10.0 10.4  --  9.6 6.5  HGB 11.3* 13.6 15.0 11.1* 10.9*  HCT 35.7 42.4 44.0 35.1* 34.9*  PLT 232 318  --  264 244  MCV 86 86.5  --  87.1 88.1  MCH 27.3 27.8  --  27.5 27.5  MCHC 31.7* 32.1  --  31.6 31.2  RDW 28.3* 27.7*  --  27.5* 27.8*  LYMPHSABS 0.3*  --   --   --   --   EOSABS 0.0  --   --   --   --   BASOSABS 0.0  --   --   --   --     Chemistries   Recent Labs Lab 08/14/14 2105 08/15/14 0537 08/16/14 0341  NA 137 139 140  K 4.3 3.5 3.7  CL 101 103 105  CO2  --  28 25  GLUCOSE 263* 95 88  BUN 36* 30* 27*  CREATININE 1.40* 1.14* 0.94  CALCIUM  --  8.9 9.6   ------------------------------------------------------------------------------------------------------------------ estimated creatinine clearance is 36.6 mL/min (by C-G formula based on Cr of 0.94). ------------------------------------------------------------------------------------------------------------------ No results for input(s): HGBA1C in the last 72 hours. ------------------------------------------------------------------------------------------------------------------ No results for input(s): CHOL, HDL, LDLCALC, TRIG, CHOLHDL, LDLDIRECT in the last 72 hours. ------------------------------------------------------------------------------------------------------------------ No results for input(s): TSH, T4TOTAL, T3FREE, THYROIDAB in the last 72 hours.  Invalid input(s):  FREET3 ------------------------------------------------------------------------------------------------------------------ No results for input(s): VITAMINB12, FOLATE, FERRITIN, TIBC, IRON, RETICCTPCT in the last 72 hours.  Coagulation profile  Recent Labs Lab 08/14/14 2050 08/15/14 0537 08/16/14 0341  INR 1.80* 2.01* 2.09*    No results for input(s): DDIMER in the last 72 hours.  Cardiac Enzymes No results for input(s): CKMB, TROPONINI, MYOGLOBIN in the last 168 hours.  Invalid input(s): CK ------------------------------------------------------------------------------------------------------------------ Invalid input(s): POCBNP     Time Spent in minutes   35 minutes   Doree Barthel L M.D on 08/16/2014 at 8:32 AM [email or web address] password [facility]

## 2014-08-17 DIAGNOSIS — N179 Acute kidney failure, unspecified: Secondary | ICD-10-CM

## 2014-08-17 LAB — PROTIME-INR
INR: 1.98 — ABNORMAL HIGH (ref 0.00–1.49)
Prothrombin Time: 22.7 seconds — ABNORMAL HIGH (ref 11.6–15.2)

## 2014-08-17 MED ORDER — WARFARIN SODIUM 3 MG PO TABS
3.0000 mg | ORAL_TABLET | Freq: Once | ORAL | Status: AC
  Administered 2014-08-17: 3 mg via ORAL
  Filled 2014-08-17: qty 1

## 2014-08-17 MED ORDER — AMOXICILLIN-POT CLAVULANATE 875-125 MG PO TABS
1.0000 | ORAL_TABLET | Freq: Two times a day (BID) | ORAL | Status: DC
  Administered 2014-08-17 – 2014-08-18 (×3): 1 via ORAL
  Filled 2014-08-17 (×4): qty 1

## 2014-08-17 NOTE — Progress Notes (Signed)
Patient Demographics  [name], is a 81 y.o. female, DOB - 1933-04-26, YIR:[id number]  Admit date - 08/14/2014   Admitting Physician Theressa Millard, MD  Outpatient Primary MD for the patient is  [name], MD  LOS - 3   Chief Complaint  Patient presents with  . Respiratory Distress      [place] history of present illness/brief narrative: DAPHANE ODEKIRK is a 81 y.o. female with a history of Wegener's Granulomatosis,Asthma, and PE on Coumadin Rx who presents to the ED with complaints of worsening SOB over the past days. EMS placed her on CPAP on route the ED. In the ED, she was administered nebulizer treatments and received pulmonary toilet and expectorated a large mucus plug and began to improve. She was referred for observation and monitoring. She denied having any fevers or chills. Chest x-ray does not show an acute opacity or infiltrate, patient reports significant nasal discharge, postnasal drip, during previous admission patient required intubation for airway protection where she was found to have nasopharyngeal mass/mucous plug extending all the way to subglottic area, ulcers then were negative for fungus, and pathology significant for mucus and inflammatory cells.   Assessment & Plan   Acute respiratory distress/ due to mucus plugging Continue mucolytics, nebs. Flutter valve started. Monitor for 24 hours. Back to SNF tomorrow if stable.  Chronic sinusitis: Continue flonase, saline, mucolytics. Change rocephin to augmentin  Acute kidney injury Resolved.  Lasix held.  Volume depletion likely worsens mucous plugging,   Wegener's granulomatosis/intrinsic asthma - Continue with low-dose prednisone Resume advair, continue nebs.  PE - Continue with warfarin, dosed by pharmacy  Vitamin D deficiency - Continue with vitamin D supplement  PT  eval recommends SNF  Code Status: Full  Family Communication: SIL at bedside  Disposition Plan: SNF 1-2 days if stable   Procedures  None   Consults   None   Medications  Scheduled Meds: . acidophilus  2 capsule Oral Daily  . B-complex with vitamin C  1 tablet Oral Daily  . calcium-vitamin D  1 tablet Oral Q breakfast  . cefTRIAXone (ROCEPHIN)  IV  1 g Intravenous Q24H  . cholecalciferol  1,000 Units Oral Daily  . fluticasone  1 spray Each Nare Daily  . folic acid  1 mg Oral Daily  . guaiFENesin  600 mg Oral BID  . ipratropium-albuterol  3 mL Nebulization TID  . latanoprost  1 drop Both Eyes QHS  . mometasone-formoterol  2 puff Inhalation BID  . multivitamin with minerals  1 tablet Oral Daily  . pantoprazole  40 mg Oral BID  . polyethylene glycol  17 g Oral Daily  . predniSONE  5 mg Oral Q breakfast  . saccharomyces boulardii  250 mg Oral BID  . sodium chloride  1 spray Each Nare Q3H  . sodium chloride  3 mL Intravenous Q12H  . sulfamethoxazole-trimethoprim  1 tablet Oral Once per day on Mon Wed Fri  . timolol  1 drop Left Eye Q breakfast  . Warfarin - Pharmacist Dosing Inpatient   Does not apply q1800   Continuous Infusions:   PRN Meds:.sodium chloride, acetaminophen **OR** acetaminophen, alum & mag hydroxide-simeth, guaiFENesin, HYDROmorphone (DILAUDID) injection, levalbuterol, LORazepam, menthol-cetylpyridinium, ondansetron **OR** ondansetron (ZOFRAN)  IV, oxyCODONE, sodium chloride  DVT Prophylaxis  on warfarin  Lab Results  Component Value Date   PLT 244 08/16/2014    Antibiotics    Anti-infectives    Start     Dose/Rate Route Frequency Ordered Stop   08/17/14 0900  sulfamethoxazole-trimethoprim (BACTRIM DS,SEPTRA DS) 800-160 MG per tablet 1 tablet     1 tablet Oral Once per day on Mon Wed Fri 08/15/14 0332     08/15/14 1200  cefTRIAXone (ROCEPHIN) 1 g in dextrose 5 % 50 mL IVPB - Premix     1 g 100 mL/hr over 30 Minutes Intravenous Every 24 hours  08/15/14 1111       Subjective:   Felt dyspneic yesterday. Coughed up mucous and improved. Feels well today.  Anxious to go home.  Objective:   Filed Vitals:   08/16/14 1732 08/16/14 2144 08/17/14 0517 08/17/14 0819  BP:  126/57 122/56   Pulse:  115 102   Temp:  98.2 F (36.8 C) 98 F (36.7 C)   TempSrc:  Oral Oral   Resp:  23 20   Height:      Weight:      SpO2: 97% 100% 98% 96%    Wt Readings from Last 3 Encounters:  08/16/14 53.4 kg (117 lb 11.6 oz)  08/10/14 54.432 kg (120 lb)  08/10/14 57.471 kg (126 lb 11.2 oz)     Intake/Output Summary (Last 24 hours) at 08/17/14 0829 Last data filed at 08/17/14 0510  Gross per 24 hour  Intake      0 ml  Output    100 ml  Net   -100 ml     Physical Exam  Awake Alert, Oriented X 3, HOH. No longer hoars HEENT: minimal maxillary sinus tenderness. OP without erythema or exudate. No thrush. MMM Supple Neck,No JVD, No cervical lymphadenopathy appriciated.  Symmetrical Chest wall movement, Good air movement bilaterally, CTAB without WRR RRR,No Gallops,Rubs or new Murmurs, No Parasternal Heave +ve B.Sounds, Abd Soft, No tenderness, No organomegaly appriciated, No rebound - guarding or rigidity. No Cyanosis, Clubbing or edema, No new Rash or bruise    Data Review   Micro Results No results found for this or any previous visit (from the past 240 hour(s)).  Radiology Reports No results found.  CBC  Recent Labs Lab 08/10/14 1539 08/14/14 2050 08/14/14 2105 08/15/14 0537 08/16/14 0341  WBC 10.0 10.4  --  9.6 6.5  HGB 11.3* 13.6 15.0 11.1* 10.9*  HCT 35.7 42.4 44.0 35.1* 34.9*  PLT 232 318  --  264 244  MCV 86 86.5  --  87.1 88.1  MCH 27.3 27.8  --  27.5 27.5  MCHC 31.7* 32.1  --  31.6 31.2  RDW 28.3* 27.7*  --  27.5* 27.8*  LYMPHSABS 0.3*  --   --   --   --   EOSABS 0.0  --   --   --   --   BASOSABS 0.0  --   --   --   --     Chemistries   Recent Labs Lab 08/14/14 2105 08/15/14 0537 08/16/14 0341  NA 137  139 140  K 4.3 3.5 3.7  CL 101 103 105  CO2  --  28 25  GLUCOSE 263* 95 88  BUN 36* 30* 27*  CREATININE 1.40* 1.14* 0.94  CALCIUM  --  8.9 9.6   ------------------------------------------------------------------------------------------------------------------ estimated creatinine clearance is 36.6 mL/min (by C-G formula based on Cr of 0.94). ------------------------------------------------------------------------------------------------------------------  No results for input(s): HGBA1C in the last 72 hours. ------------------------------------------------------------------------------------------------------------------ No results for input(s): CHOL, HDL, LDLCALC, TRIG, CHOLHDL, LDLDIRECT in the last 72 hours. ------------------------------------------------------------------------------------------------------------------ No results for input(s): TSH, T4TOTAL, T3FREE, THYROIDAB in the last 72 hours.  Invalid input(s): FREET3 ------------------------------------------------------------------------------------------------------------------ No results for input(s): VITAMINB12, FOLATE, FERRITIN, TIBC, IRON, RETICCTPCT in the last 72 hours.  Coagulation profile  Recent Labs Lab 08/14/14 2050 08/15/14 0537 08/16/14 0341  INR 1.80* 2.01* 2.09*    No results for input(s): DDIMER in the last 72 hours.  Cardiac Enzymes No results for input(s): CKMB, TROPONINI, MYOGLOBIN in the last 168 hours.  Invalid input(s): CK ------------------------------------------------------------------------------------------------------------------ Invalid input(s): POCBNP  Time Spent in minutes   35 minutes  Flavia Bruss L M.D on 08/17/2014 at 8:29 AM [email or web address] password [email or web address]

## 2014-08-17 NOTE — Clinical Documentation Improvement (Signed)
Presents with mucous plugging. Conflicting documentation regarding Acute Respiratory Failure with hypercapnia (noted in ED documentation) vs Acute Respiratory Distress (noted in Medicine Progress Notes).   Patient tachypneic in ED with respiratory rates ranging from 23 to 28  Patient with tachycardia with heart rate greater than 100  First ABG = 7.09 - 97.5 - 94 with Bicarb of 30  Second ABG after mucous plug coughed up = 7.54 - 25.7 - 185  Respiratory Alkalosis and Acidosis documented  Patient placed on Bipap and 8L FiO2.  Please provide a diagnosis associated with the above clinical indicators and treatment provided and document findings in next progress note and include in discharge summary.  Acute Respiratory Failure Acute on Chronic Respiratory Failure Chronic Respiratory Failure Acute Respiratory Distress as documented Other Condition  Thank You, Zoila Shutter ,RN Clinical Documentation Specialist:  [facility] [employer]

## 2014-08-17 NOTE — Clinical Social Work Note (Signed)
CSW received return call from patient's son in law. Dr. Leo Grosser confirms plan for patient to return to [facility] SNF.   Liz Beach MSW, [place], [place], [phone]

## 2014-08-17 NOTE — Progress Notes (Signed)
[name] for Coumadin Indication: h/o PE and clotting d/o  Allergies  Allergen Reactions  . Adhesive [Tape] Rash    Vital Signs: Temp: 98 F (36.7 C) (02/15 0517) Temp Source: Oral (02/15 0517) BP: 122/56 mmHg (02/15 0517) Pulse Rate: 102 (02/15 0517)  Labs:  Recent Labs  08/14/14 2050 08/14/14 2105 08/15/14 0537 08/16/14 0341 08/17/14 0900  HGB 13.6 15.0 11.1* 10.9*  --   HCT 42.4 44.0 35.1* 34.9*  --   PLT 318  --  264 244  --   LABPROT 21.0*  --  23.0* 23.7* 22.7*  INR 1.80*  --  2.01* 2.09* 1.98*  CREATININE  --  1.40* 1.14* 0.94  --     Estimated Creatinine Clearance: 36.6 mL/min (by C-G formula based on Cr of 0.94).    Assessment: 81yo female presents w/ acute respiratory distress and known CHF, to continue Coumadin for h/o PE w/ clotting disorder. INR 1.98.  On Septra DS MWF as PTA.  PTA warfarin dose: 2.5 mg po daily- last dose PTA 2/11.  Dose missed 2/12.  Got 3 mg on 2/13 and 2/14  Goal of Therapy:  INR 2-3    Plan -repeat Warfarin 3 mg po x1 -Daily INR  Eudelia Bunch, Pharm.D. [phone] 08/17/2014 9:45 AM

## 2014-08-17 NOTE — Progress Notes (Signed)
Pt not available for Flutter valve instruction,device left at bedside for instruction tomorrow.

## 2014-08-17 NOTE — Clinical Social Work Psychosocial (Signed)
Clinical Social Work Department BRIEF PSYCHOSOCIAL ASSESSMENT 08/17/2014  Patient:  [name], [name]     Account Number:  [id number]     Admit date:  08/14/2014  Clinical Social Worker:  Lovey Newcomer  Date/Time:  08/17/2014 01:52 PM  Referred by:  Physician  Date Referred:  08/17/2014 Referred for  SNF Placement   Other Referral:   NA   Interview type:  Family Other interview type:   Patient's son Earley Favor (listed as primary contact) interviewed by phone to complete assessment.    PSYCHOSOCIAL DATA Living Status:  FACILITY Admitted from facility:  [facility] Level of care:  [place] [facility] Primary support name:  Earley Favor Primary support relationship to patient:  CHILD, ADULT Degree of support available:   Support is strong.    CURRENT CONCERNS Current Concerns  Post-Acute Placement   Other Concerns:   NA    SOCIAL WORK ASSESSMENT / PLAN CSW spoke with patient's son Earley Favor by phone to complete assessment. Earley Favor states that the patient was admitted from [facility] where she has been staying at [facility] level of care. Prior to SNF, the patient was at [facility] ([facility]). Earley Favor states that the plan is for the patient to return to [facility] SNF once medically stable, but asks that CSW speak with patient's son in law Dr. Leo Grosser as well. CSW has left messages with Dr. Leo Grosser.    Per Earley Favor patient has been having problems with her breathing lately. He states the family has been worried about her health but they remain optimistic that she will improve. CSW explained role and asnwered [name]'s questions. CSW will assist with DC when appropriate.   Assessment/plan status:  Psychosocial Support/Ongoing Assessment of Needs Other assessment/ plan:   Complete Fl2, Fax, PASRR   Information/referral to community resources:   CSW contact information given to [facility] and Dr. Leo Grosser.    PATIENT'S/FAMILY'S RESPONSE TO PLAN OF CARE: Patient's  family plans for the patient to DC to [facility] SNF once medically stable. CSW will assist with DC as appropriate.       Liz Beach MSW, [place], [place], [phone]

## 2014-08-17 NOTE — Progress Notes (Signed)
Medicare Important Message given?  YES (If response is "NO", the following Medicare IM given date fields will be blank) Date Medicare IM given:  08/17/14 Medicare IM given by:  Derriona Branscom 

## 2014-08-18 DIAGNOSIS — J329 Chronic sinusitis, unspecified: Secondary | ICD-10-CM | POA: Diagnosis present

## 2014-08-18 DIAGNOSIS — J328 Other chronic sinusitis: Secondary | ICD-10-CM

## 2014-08-18 LAB — PROTIME-INR
INR: 2.08 — ABNORMAL HIGH (ref 0.00–1.49)
Prothrombin Time: 23.5 seconds — ABNORMAL HIGH (ref 11.6–15.2)

## 2014-08-18 MED ORDER — AMOXICILLIN-POT CLAVULANATE 875-125 MG PO TABS: 1.0000 | ORAL_TABLET | Freq: Two times a day (BID) | ORAL | Status: DC

## 2014-08-18 MED ORDER — LORAZEPAM 0.5 MG PO TABS: 0.5000 mg | ORAL_TABLET | Freq: Four times a day (QID) | ORAL | Status: DC | PRN

## 2014-08-18 MED ORDER — SULFAMETHOXAZOLE-TRIMETHOPRIM 800-160 MG PO TABS: 1.0000 | ORAL_TABLET | ORAL | Status: DC

## 2014-08-18 MED ORDER — PREDNISONE 5 MG PO TABS: 5.0000 mg | ORAL_TABLET | Freq: Every day | ORAL | Status: DC

## 2014-08-18 NOTE — Care Management Note (Unsigned)
    Page 1 of 1   08/18/2014     12:47:06 PM CARE MANAGEMENT NOTE 08/18/2014  Patient:  [name], [name]   Account Number:  [id number]  Date Initiated:  08/18/2014  Documentation initiated by:  Tomi Bamberger  Subjective/Objective Assessment:   dx ARF  admit- from [facility]     Action/Plan:   pt eval- snf   Anticipated DC Date:  08/19/2014   Anticipated DC Plan:  SKILLED NURSING FACILITY  In-house referral  Clinical Social Worker      DC Planning Services  CM consult      Choice offered to / List presented to:             Status of service:  In process, will continue to follow Medicare Important Message given?  YES (If response is "NO", the following Medicare IM given date fields will be blank) Date Medicare IM given:  08/18/2014 Medicare IM given by:  Tomi Bamberger Date Additional Medicare IM given:   Additional Medicare IM given by:    Discharge Disposition:    Per UR Regulation:  Reviewed for med. necessity/level of care/duration of stay  If discussed at [place] of Stay Meetings, dates discussed:    Comments:

## 2014-08-18 NOTE — Progress Notes (Signed)
Patient was discharged to nursing home [facility]) by MD order; discharged instructions  review and sent to facility with care notes; IV DIC; skin intact, discoloration on both hands; patient will be escorted to the [facility] by a volunteer via wheelchair. Facility was called and report was given to the nurse who is going to receive the patient.

## 2014-08-18 NOTE — Clinical Social Work Note (Signed)
Per MD patient ready to DC back to [facility] SNF. RN, patient/family (Daughter Katharine Look), and facility notified of patient's DC. RN given number for report. DC packet on patient's chart. Facility will arrive at 12pm at [facility] entrance for transport. RN and NT made aware.. CSW signing off at this time.   Liz Beach MSW, [place], [place], [phone]

## 2014-08-18 NOTE — Discharge Summary (Signed)
Physician Discharge Summary  [name]:[id number] DOB: 21-Aug-1932 DOA: 08/14/2014  PCP:  [name], MD  Admit date: 08/14/2014 Discharge date: 08/18/2014  Time spent: greater than 30 minutes  Recommendations for Outpatient Follow-up:  1. To [facility] 2. Monitor INR and adjust warfarin to keep INR 2.0 - 3.0  Discharge Diagnoses:  Principal Problem:   Acute respiratory distress secondary to mucous plug Active Problems:   WEGENERS GRANULOMATOSIS   Intrinsic asthma   Long term current use of anticoagulant therapy, history of PE   Vitamin D deficiency   AKI (acute kidney injury) secondary to volume depletion Acute on chronic sinusitis Hearing impaired  Discharge Condition: stable  Filed Weights   08/15/14 0328 08/16/14 0012  Weight: 52.1 kg (114 lb 13.8 oz) 53.4 kg (117 lb 11.6 oz)    History of present illness:  81 y.o. female with a history of Wegener's Granulomatosis,Asthma, and PE on Coumadin Rx who presents to the ED with complaints of worsening SOB over the past days. EMS placed her on CPAP on route the ED. In the ED, she was administered nebulizer treatments and received pulmonary toilet and expectorated a large mucus plug and began to improve. She was referred for observation and monitoring. She denied having any fevers or chills.   Hospital Course:  Observed in step down unit. Had no further problems with mucous plugging but continued to have copious tenacious difficult to clear postnasal drip sometimes leading to dyspnea. Dr. Lucia Gaskins, ENT, was contacted and recommended continuing saline washes, hydration and humidified air. Patient also was started initially on Rocephin and then transitioned to Augmentin for acute on chronic sinusitis. Patient has worked with physical therapy.  She has received nebulizers which help her clear her secretions and sputum. Also flutter valve. On admission, volume depleted. Had been on Lasix over the past few months for leg  edema. Creatinine was above 1.5. She was gently hydrated and a discharge creatinine normalized. She is euvolemic currently.  Would avoid intravascular volume depletion, as this will likely make the sinus drainage and mucus plugging difficult to clear. She is feeling much better and stable for discharge back to skilled nursing facility.  Procedures:  None  Consultations:  None  Discharge Exam: Filed Vitals:   08/18/14 0620  BP: 118/47  Pulse: 82  Temp: 98.2 F (36.8 C)  Resp: 20    General: Working with physical therapy. Talkative and in no distress. Cardiovascular: Regular rate rhythm without murmurs gallops rubs Respiratory: Clear to auscultation bilaterally without wheezes rhonchi or rales extremities: No clubbing cyanosis or edema.  Discharge Instructions   Discharge Instructions    Diet - low sodium heart healthy    Complete by:  As directed      Discharge instructions    Complete by:  As directed   LASIX HAS BEEN STOPPED. CONTINUE SALINE NASAL RINSES MULTIPLE TIMES DAILY     Increase activity slowly    Complete by:  As directed           Current Discharge Medication List    START taking these medications   Details  amoxicillin-clavulanate (AUGMENTIN) 875-125 MG per tablet Take 1 tablet by mouth every 12 (twelve) hours. Through 08/28/14 then stop      CONTINUE these medications which have CHANGED   Details  predniSONE (DELTASONE) 5 MG tablet Take 1 tablet (5 mg total) by mouth daily with breakfast. As directed    sulfamethoxazole-trimethoprim (BACTRIM DS,SEPTRA DS) 800-160 MG per tablet Take 1  tablet by mouth every Monday, Wednesday, and Friday. HOLD WHILE ON AUGMENTIN, THEN RESUME AS PREVIOUS Refills: 1      CONTINUE these medications which have NOT CHANGED   Details  acetaminophen (TYLENOL) 325 MG tablet Take 2 tablets (650 mg total) by mouth every 6 (six) hours as needed for mild pain (or Fever >/= 101). Qty: 30 tablet, Refills: 2    ADVAIR DISKUS  500-50 MCG/DOSE AEPB INHALE 1 PUFF INTO LUNGS EVERY 12 HOURS Qty: 60 each, Refills: 4    b complex vitamins capsule Take 1 capsule by mouth daily.    calcium-vitamin D (OSCAL WITH D) 500-200 MG-UNIT per tablet Take 1 tablet by mouth daily with breakfast.    cholecalciferol (VITAMIN D) 1000 UNITS tablet One daily for vitamin D supplement Qty: 30 tablet, Refills: 11   Associated Diagnoses: Vitamin D deficiency    fluticasone (FLONASE) 50 MCG/ACT nasal spray Place 1 spray into both nostrils daily.  Refills: 5    folic acid (FOLVITE) 1 MG tablet Take 1 tablet (1 mg total) by mouth daily. Qty: 30 tablet, Refills: 0   Associated Diagnoses: Iron deficiency anemia due to chronic blood loss    guaiFENesin (MUCINEX) 600 MG 12 hr tablet Take 600 mg by mouth 2 (two) times daily.    latanoprost (XALATAN) 0.005 % ophthalmic solution Place 1 drop into both eyes at bedtime.    levalbuterol (XOPENEX) 0.63 MG/3ML nebulizer solution Take 3 mLs (0.63 mg total) by nebulization every 6 (six) hours as needed for wheezing or shortness of breath. Qty: 3 mL, Refills: 12    LORazepam (ATIVAN) 0.5 MG tablet Take 1 tablet (0.5 mg total) by mouth every 6 (six) hours as needed for anxiety. Qty: 30 tablet, Refills: 0    Multiple Vitamins-Minerals (MULTIVITAMIN WITH MINERALS) tablet Take 1 tablet by mouth daily.    pantoprazole (PROTONIX) 40 MG tablet Take 1 tablet (40 mg total) by mouth 2 (two) times daily. Qty: 60 tablet, Refills: 2    phenol (CHLORASEPTIC) 1.4 % LIQD Use as directed 1 spray in the mouth or throat as needed for throat irritation / pain.    polyethylene glycol (MIRALAX / GLYCOLAX) packet Take 17 g by mouth daily. Qty: 14 each, Refills: 0    saccharomyces boulardii (FLORASTOR) 250 MG capsule Take 250 mg by mouth 2 (two) times daily.    timolol (BETIMOL) 0.5 % ophthalmic solution Place 1 drop into the left eye daily with breakfast.     warfarin (COUMADIN) 2.5 MG tablet Take 2.5 mg by mouth  daily at 6 PM.       STOP taking these medications     cephALEXin (KEFLEX) 500 MG capsule      furosemide (LASIX) 40 MG tablet        Allergies  Allergen Reactions  . Adhesive [Tape] Rash   Follow-up Information    Follow up with  [name], MD In 1 week.   Specialty:  Family Medicine   Contact information:   [address] [place] [address] [phone]        The results of significant diagnostics from this hospitalization (including imaging, microbiology, ancillary and laboratory) are listed below for reference.    Significant Diagnostic Studies: Dg Chest 2 View (if Patient Has Fever And/or Copd)  07/20/2014   CLINICAL DATA:  Cough and shortness of breath for 1 month.  EXAM: CHEST  2 VIEW  COMPARISON:  Chest x-rays dated 07/10/2014 and 07/08/2014 and 06/25/2014 and chest CT  dated 09/24/2013  FINDINGS: The heart size and pulmonary vascularity are normal. There are multiple areas of scarring in both lungs, particularly in the right upper lobe and left lung base. There is a small area of scarring laterally at the left lung base as well as posterior medially. There is a tiny right pleural effusion, unchanged since 06/25/2014 but new since 09/24/2013.  The markings are slightly more accentuated at the bases than on the prior study.  No acute osseous abnormalities. There are emphysematous changes bilaterally.  IMPRESSION: Emphysema with scarring in both lungs. Slight increased accentuation of the interstitial markings could represent superimposed bronchitis. Persistent small right pleural effusion.   Electronically Signed   By: Rozetta Nunnery M.D.   On: 07/20/2014 14:17   Ct Chest W Contrast  07/21/2014   CLINICAL DATA:  81 year old female with history of small vessel vasculitis (granulomatous polyangitis) with history of airway stenosis, presenting with loud wheeze and scant hemoptysis. Evaluate for potential parenchymal involvement of the lungs versus airway stenosis. Shortness  of breath, wheezing and fever for 1 month.  EXAM: CT CHEST WITH CONTRAST  TECHNIQUE: Multidetector CT imaging of the chest was performed during intravenous contrast administration.  CONTRAST:  70mL OMNIPAQUE IOHEXOL 300 MG/ML  SOLN  COMPARISON:  Chest CT 09/24/2013.  FINDINGS: Comment: Examination is limited by considerable patient respiratory motion.  Mediastinum/Lymph Nodes: Heart size is mildly enlarged. There is no significant pericardial fluid, thickening or pericardial calcification. Calcifications of the mitral annulus. No pathologically enlarged mediastinal or hilar lymph nodes. Esophagus is unremarkable in appearance.  Lungs/Pleura: In the proximal trachea shortly beneath the level of the vocal cords There is some thin irregular-shaped soft tissue which has the appearance of a small membrane, which appears intimately associated with the wall of the trachea. The trachea and mainstem bronchi are otherwise widely patent. Again noted are nodular appearing areas of architectural distortion in the lungs bilaterally, most notably in the left lower lobe where there is a 1.8 x 1.3 cm spiculated appearing nodular density in the superior segment of the left lower lobe, and a 2.4 x 1.5 cm spiculated appearing nodular density in the posterior aspect of the left lower lobe. Multiple other smaller nodules are noted. These two largest lesions appear more prominent than the prior study from 09/24/2013, however, when compared to more remote prior studies such that is 01/03/2011, both of these lesions appear slightly smaller, and are presumably areas of chronic postinflammatory scarring given the patient's history of vasculitis. There is a background of mild diffuse ground-glass attenuation with mild interlobular septal thickening, suggesting a background of mild interstitial pulmonary edema. Moderate bilateral pleural effusions are noted. Large bulla in the apex of the left hemithorax is unchanged.  Musculoskeletal/Soft  Tissues: There are no aggressive appearing lytic or blastic lesions noted in the visualized portions of the skeleton.  Upper Abdomen: Unremarkable.  IMPRESSION: 1. There is a thin membranous appearing structure in the proximal trachea immediately beneath the level the vocal cords, which is of uncertain etiology and significance, but may account for the patient's upper airway wheezing on physical examination. Consideration for direct inspection with bronchoscopy is recommended if clinically appropriate. 2. The appearance of the chest suggests mild congestive heart failure, as above. 3. Multiple nodules throughout the lungs bilaterally (left greater than right), similar in appearance to numerous prior examinations, presumably sequela of underlying Wegner's granulomatosis. No new large cavitary lesion to suggest active parenchymal involvement at this time. 4. Mild cardiomegaly.   Electronically Signed  By: Vinnie Langton M.D.   On: 07/21/2014 13:01   Ct Maxillofacial W/cm  07/22/2014   CLINICAL DATA:  Wegner's granulomatosis. Question fungal sinusitis as cause of airway debris.  EXAM: CT MAXILLOFACIAL WITH CONTRAST  TECHNIQUE: Multidetector CT imaging of the maxillofacial structures was performed with intravenous contrast. Multiplanar CT image reconstructions were also generated. A small metallic BB was placed on the right temple in order to reliably differentiate right from left.  CONTRAST:  153mL OMNIPAQUE IOHEXOL 300 MG/ML  SOLN  COMPARISON:  05/08/2008  FINDINGS: Chronic pansinusitis status post medial maxillary antrostomies, bilateral turbinectomies, and ethmoidectomies. The right sphenoid sinus has also been opened. Mucosal thickening is diffuse throughout the sinuses and there are small layering effusions within the maxillary and right sphenoid sinuses. The surgical openings are patent, although the right sphenoidotomy is moderately narrowed by mucosal disease. There is a small perforation in the anterior  lower nasal septum. These changes are all consistent with history of Wegner's granulomatosis. Concerning the clinical question of fungal sinusitis, there certainly could be fungal colonization (some of the sinus material is high-density), but no fungal mass or invasive sinusitis is detected to explain the obstructive airway mass on bronchoscopy.  Complete opacification of the bilateral mastoid and middle ear cavities. No wrist changes.  Bilateral cataract resection.  Otherwise, the orbits are negative.  Limited evaluation of the oral cavity, oropharynx, and hypopharynx due to intubation with retained fluid.  IMPRESSION: 1. Wegner's granulomatosis with pansinusitis and otomastoiditis described above. 2. Concerning the clinical question, there may be sinus fungal colonization but there is no visible mycetoma or invasive sinusitis.   Electronically Signed   By: Jorje Guild M.D.   On: 07/22/2014 22:34   Dg Chest Portable 1 View  08/14/2014   CLINICAL DATA:  Respiratory distress tonight, hypertension. History of CHF and pulmonary embolism.  EXAM: PORTABLE CHEST - 1 VIEW  COMPARISON:  Chest radiograph July 24, 2014  FINDINGS: The cardiac silhouette is upper limits of normal in size, mediastinal silhouette is nonsuspicious. Mildly calcified aortic knob. Mild chronic interstitial changes with improved aeration lung bases from prior imaging. Strandy densities without pleural effusions or focal consolidation. No pneumothorax. Soft tissue planes and included osseous structures are nonsuspicious. Thoracic levoscoliosis and degenerative change.  IMPRESSION: Borderline cardiomegaly and mild chronic interstitial changes with bibasilar atelectasis/scarring. Improved aeration lungs from prior imaging.   Electronically Signed   By: Elon Alas   On: 08/14/2014 21:29   Dg Chest Port 1 View  07/24/2014   CLINICAL DATA:  Acute respiratory failure.  Hypoxia.  EXAM: PORTABLE CHEST - 1 VIEW  COMPARISON:  07/23/2014   FINDINGS: Left PICC line tip: Lower SVC. Mild enlargement of the cardiopericardial silhouette diffuse interstitial accentuation. Suspected layering left pleural effusion. Left mid lung hazy opacity mildly improved.  Atherosclerotic calcification of the thoracic aorta. Probable small right pleural effusion. Obscuration of the left hemidiaphragm, probably due to the pleural effusion and passive atelectasis.  IMPRESSION: 1. The dominant changes reduced hazy density in the left mid lung probably due to redistribution of left pleural effusion. There may be some slight improvement in the underlying interstitial accentuation which is probably a manifestation of pulmonary edema, less likely from atypical infectious process.   Electronically Signed   By: Sherryl Barters M.D.   On: 07/24/2014 10:13   Dg Chest Port 1 View  07/23/2014   CLINICAL DATA:  Acute respiratory failure  EXAM: PORTABLE CHEST - 1 VIEW  COMPARISON:  07/22/2014  FINDINGS:  Cardiac shadow is stable. An endotracheal tube, left-sided PICC line and nasogastric catheter are stable in appearance. Increasing density is noted in the left retrocardiac region consistent with atelectasis. Diffuse vascular congestion is again identified.  IMPRESSION: Increasing left lower lobe atelectasis. The remainder of the exam is stable.   Electronically Signed   By: Inez Catalina M.D.   On: 07/23/2014 07:13   Dg Chest Port 1 View  07/22/2014   CLINICAL DATA:  Respiratory failure.  Endotracheal tube placement.  EXAM: PORTABLE CHEST - 1 VIEW  COMPARISON:  07/20/2014  FINDINGS: The endotracheal to is 3.5 cm above the carina. The NG tube is in the stomach. The cardiac silhouette, mediastinal and hilar contours are stable. Moderate pulmonary edema. Small pleural effusions.  IMPRESSION: The endotracheal tube and NG tubes are in good position.  Moderate pulmonary edema.   Electronically Signed   By: Kalman Jewels M.D.   On: 07/22/2014 14:21   Dg Chest Port 1 View  07/21/2014    CLINICAL DATA:  F/u SOB, droplet precautions, hx. Asthma, fever, cough and congestion, chest tightness and wheezing  EXAM: PORTABLE CHEST - 1 VIEW  COMPARISON:  the previous day's study  FINDINGS: Increase in diffuse interstitial infiltrates or edema, with peripheral septal lines and some patchy new airspace opacities in bilateral lung bases. Heart size upper limits normal for technique. No effusion. Mild spondylitic changes in the mid thoracic spine.  IMPRESSION: 1. Progressive bilateral edema/infiltrates as above.   Electronically Signed   By: Arne Cleveland M.D.   On: 07/21/2014 11:02    Microbiology: No results found for this or any previous visit (from the past 240 hour(s)).   Labs: Basic Metabolic Panel:  Recent Labs Lab 08/14/14 2105 08/15/14 0537 08/16/14 0341  NA 137 139 140  K 4.3 3.5 3.7  CL 101 103 105  CO2  --  28 25  GLUCOSE 263* 95 88  BUN 36* 30* 27*  CREATININE 1.40* 1.14* 0.94  CALCIUM  --  8.9 9.6   Liver Function Tests: No results for input(s): AST, ALT, ALKPHOS, BILITOT, PROT, ALBUMIN in the last 168 hours. No results for input(s): LIPASE, AMYLASE in the last 168 hours. No results for input(s): AMMONIA in the last 168 hours. CBC:  Recent Labs Lab 08/14/14 2050 08/14/14 2105 08/15/14 0537 08/16/14 0341  WBC 10.4  --  9.6 6.5  HGB 13.6 15.0 11.1* 10.9*  HCT 42.4 44.0 35.1* 34.9*  MCV 86.5  --  87.1 88.1  PLT 318  --  264 244   Cardiac Enzymes: No results for input(s): CKTOTAL, CKMB, CKMBINDEX, TROPONINI in the last 168 hours. BNP: BNP (last 3 results)  Recent Labs  07/20/14 1320 08/14/14 2050  BNP 210.5* 216.7*    ProBNP (last 3 results) No results for input(s): PROBNP in the last 8760 hours.  CBG: No results for input(s): GLUCAP in the last 168 hours.     SignedDelfina Redwood  [facility] 08/18/2014, 9:57 AM

## 2014-08-18 NOTE — Progress Notes (Signed)
Physical Therapy Treatment Patient Details Name: [name] MRN: [id number] DOB: September 17, 1932 Today's Date: 08/18/2014    History of Present Illness [name] is a 81 y.o. female with a history of Wegener's Granulomatosis,Asthma, and PE on Coumadin Rx who presents to the ED with complaints of worsening SOB over the past days. Found to have nasopharyngeal mass/mucous plug extending all the way to subglottic area.    PT Comments    Progressing well.  SpO2 at 93/94% on RA and EHR up to 118-123 bpm.  Pt's fatigue managed well with moderately quick recovery.   Follow Up Recommendations  SNF     Equipment Recommendations  None recommended by PT    Recommendations for Other Services       Precautions / Restrictions Precautions Precautions: Fall Precaution Comments: check sats Restrictions Weight Bearing Restrictions: No    Mobility  Bed Mobility Overal bed mobility: Needs Assistance Bed Mobility: Supine to Sit;Sit to Supine     Supine to sit: Supervision Sit to supine: Supervision   General bed mobility comments: Supervision for safety. Did not require physical assist, but initially looked to therapist for help.  Transfers Overall transfer level: Needs assistance Equipment used: Rolling walker (2 wheeled) Transfers: Sit to/from Stand Sit to Stand: Supervision         General transfer comment: Ued good hand placement, no assist needed.  Ambulation/Gait Ambulation/Gait assistance: Supervision Ambulation Distance (Feet): 150 Feet Assistive device: Rolling walker (2 wheeled) Gait Pattern/deviations: Step-through pattern Gait velocity: decreased   General Gait Details: cues for posture, o/w steady with light use of the RW   Stairs            Wheelchair Mobility    Modified Rankin (Stroke Patients Only)       Balance Overall balance assessment: Needs assistance Sitting-balance support: No upper extremity supported Sitting balance-Leahy Scale:  Good     Standing balance support: No upper extremity supported Standing balance-Leahy Scale: Fair                      Cognition Arousal/Alertness: Awake/alert Behavior During Therapy: WFL for tasks assessed/performed Overall Cognitive Status: Within Functional Limits for tasks assessed                      Exercises General Exercises - Lower Extremity Ankle Circles/Pumps: AROM;20 reps;Supine Quad Sets: AROM;Both;10 reps;Supine Gluteal Sets: AROM;Both;10 reps;Supine Heel Slides: AROM;Strengthening;10 reps;Supine (resisted) Hip ABduction/ADduction: AROM;Strengthening;Both;15 reps;Supine Straight Leg Raises: AROM;Both;Strengthening;10 reps;Supine    General Comments        Pertinent Vitals/Pain Pain Assessment: No/denies pain    Home Living                      Prior Function            PT Goals (current goals can now be found in the care plan section) Acute Rehab PT Goals Patient Stated Goal: Go back to SNF PT Goal Formulation: With patient Time For Goal Achievement: 08/30/14 Potential to Achieve Goals: Good Progress towards PT goals: Progressing toward goals    Frequency  Min 2X/week    PT Plan Current plan remains appropriate    Co-evaluation             End of Session   Activity Tolerance: Patient tolerated treatment well Patient left: in bed;with call bell/phone within reach     Time: 0935-1001 PT Time Calculation (min) (ACUTE ONLY): 26 min  Charges:  $Gait Training: 8-22 mins $Therapeutic Exercise: 8-22 mins                    G Codes:      [name], [name] 08/18/2014, 10:12 AM 08/18/2014  [name], [name] [phone]  (pager)

## 2014-08-19 ENCOUNTER — Encounter: Payer: Self-pay | Admitting: Nurse Practitioner

## 2014-08-19 ENCOUNTER — Non-Acute Institutional Stay (SKILLED_NURSING_FACILITY): Payer: Medicare Other | Admitting: Nurse Practitioner

## 2014-08-19 DIAGNOSIS — J0100 Acute maxillary sinusitis, unspecified: Secondary | ICD-10-CM

## 2014-08-19 DIAGNOSIS — K5900 Constipation, unspecified: Secondary | ICD-10-CM

## 2014-08-19 DIAGNOSIS — M3130 Wegener's granulomatosis without renal involvement: Secondary | ICD-10-CM

## 2014-08-19 DIAGNOSIS — K219 Gastro-esophageal reflux disease without esophagitis: Secondary | ICD-10-CM

## 2014-08-19 DIAGNOSIS — L84 Corns and callosities: Secondary | ICD-10-CM

## 2014-08-19 DIAGNOSIS — R0603 Acute respiratory distress: Secondary | ICD-10-CM

## 2014-08-19 DIAGNOSIS — J019 Acute sinusitis, unspecified: Secondary | ICD-10-CM | POA: Insufficient documentation

## 2014-08-19 DIAGNOSIS — D638 Anemia in other chronic diseases classified elsewhere: Secondary | ICD-10-CM

## 2014-08-19 DIAGNOSIS — R609 Edema, unspecified: Secondary | ICD-10-CM

## 2014-08-19 DIAGNOSIS — J80 Acute respiratory distress syndrome: Secondary | ICD-10-CM

## 2014-08-19 NOTE — Assessment & Plan Note (Addendum)
saline washes, hydration and humidified air. Patient also was started initially on Rocephin and then transitioned to Augmentin for acute on chronic sinusitis. Stop date 08/28/14

## 2014-08-19 NOTE — Assessment & Plan Note (Signed)
08/01/14 Hgb 11.7

## 2014-08-19 NOTE — Assessment & Plan Note (Signed)
Stable,  takes MiraLax daily.  

## 2014-08-19 NOTE — Assessment & Plan Note (Signed)
Continue PPI ?

## 2014-08-19 NOTE — Progress Notes (Signed)
Patient ID: [name], female   DOB: [birth year], 81 y.o.   MRN: [id number]   Code Status: DNR  Allergies  Allergen Reactions  . Adhesive [Tape] Rash    Chief Complaint  Patient presents with  . Medical Management of Chronic Issues  . Acute Visit  . Hospitalization Follow-up    HPI: Patient is a 81 y.o. female seen in the SNF at [facility] today for evaluation of f/u hospitalization and other chronic medical conditions.    Hospitalized 08/14/2014-08/18/2014 for Acute respiratory distress secondary to mucous plug and Acute on chronic sinusitis. Hx of WEGENERS GRANULOMATOSIS Intrinsic asthma, Long term current use of anticoagulant therapy, history of PE Vitamin D deficiency AKI (acute kidney injury) secondary to volume depletion   The patient was administered nebulizer treatments and received pulmonary toilet and expectorated a large mucus plug and began to improve in ED. Dr. Lucia Gaskins, ENT, was contacted and recommended continuing saline washes, hydration and humidified air. Patient also was started initially on Rocephin and then transitioned to Augmentin for acute on chronic sinusitis. volume depleted. Had been on Lasix over the past few months for leg edema. Creatinine was above 1.5. She was gently hydrated and a discharge creatinine normalized. She is euvolemic currently.  Would avoid intravascular volume depletion, as this will likely make the sinus drainage and mucus plugging difficult to clear.    Hospitalized 07/20/14-07/30/14 for SOB, fever 103, history of Wegener's granulomatosis. She fell ill Christmas Eve, so her PCP office and he placed her on levofloxacin for 7 days for suspected pneumonia. Patient was subsequently seen at urgent care on 1/8 for weakness and persistent cough rx sx . In the ED the patient was found to have a normal white count of 5.4. Chest x-ray shows emphysema with increased interstitial markings. 1/20 Decompensated with stridor, to ICU for emergent  intubation -large black fibrinous mass removed from trachea. 1/22 Extubated. ABXSuzie Portela, start date 1/18, day 5/5 (Stopped)Zosyn, start date 1/18, day 5/5 (Stopped) Vancomycin restarted 1/23-1/25   Problem List Items Addressed This Visit    WEGENERS GRANULOMATOSIS (Chronic)    Granulomatosis polyangitis but no clear evidence of active lung involvement 1/21, status post CT scan of the chest  Asthma, not in exacerbation Currently on prednisone 40 mg by mouth daily, taper prednisone slowly over the course of the next 2 weeks ENT eval > no evidence to support fungal sinus disease. He recommends to maximize humidity, Face shield with humidified oxygen, patient requiring 5-6 L of high flow oxygen to maintain airway humidification Continue Xopenex when necessary Continue Advair , Fungal culture, AFB, negative bronchoalveolar lavage culture shows MRSA Patient received about extremes of IV vancomycin, infectious disease consultation obtained and Dr. Megan Salon recommends Discontinue vancomycin and observe off of antibiotics 08/19/14 Septra DS M, W, F indefinitely upon completion of Augmentin.         GERD (gastroesophageal reflux disease)    Continue PPI       Edema (Chronic)    Off diuretics.       Constipation (Chronic)    Stable, takes MiraLax daily.        Callus of foot - Primary    Left foot-Podiatry 08/13/14      Anemia of chronic disease (Chronic)    08/01/14 Hgb 11.7       Acute sinusitis    saline washes, hydration and humidified air. Patient also was started initially on Rocephin and then transitioned to Augmentin for acute on chronic sinusitis. Stop date  08/28/14      Acute respiratory distress    Stabilized after mucous plugging resolved. Will maintain good hydration and tx acute on chronic sinusitis. Indefinitely Septra 3 x/wk for prophylactic therapy for respiratory infection in setting of Wegener's dx.          Review of Systems:  Review of Systems    Constitutional: Negative for fever, chills, weight loss, malaise/fatigue and diaphoresis.  HENT: Positive for hearing loss. Negative for congestion, ear discharge, ear pain, nosebleeds, sore throat and tinnitus.   Eyes: Negative for blurred vision, double vision, photophobia, pain, discharge and redness.  Respiratory: Positive for cough and shortness of breath. Negative for hemoptysis, sputum production, wheezing and stridor.   Cardiovascular: Positive for leg swelling. Negative for chest pain, palpitations, orthopnea, claudication and PND.  Gastrointestinal: Negative for heartburn, nausea, vomiting, abdominal pain, diarrhea, constipation, blood in stool and melena.  Genitourinary: Positive for frequency. Negative for dysuria, urgency, hematuria and flank pain.  Musculoskeletal: Positive for joint pain. Negative for myalgias, back pain, falls and neck pain.  Skin: Negative for itching and rash.  Neurological: Negative for dizziness, tingling, tremors, sensory change, speech change, focal weakness, seizures, loss of consciousness, weakness and headaches.  Endo/Heme/Allergies: Negative for environmental allergies and polydipsia. Bruises/bleeds easily.  Psychiatric/Behavioral: Positive for memory loss. Negative for depression, suicidal ideas, hallucinations and substance abuse. The patient is nervous/anxious. The patient does not have insomnia.      Past Medical History  Diagnosis Date  . Wegener's granulomatosis   . OA (osteoarthritis)   . Blindness of one eye   . Hearing loss in left ear     hearing aid both ears  . Bladder incontinence   . Glaucoma   . Clotting disorder   . History of pulmonary embolism 1997  . Shortness of breath dyspnea   . Pneumonia 12/15  . Multiple allergies   . Chronic sinusitis    Past Surgical History  Procedure Laterality Date  . Tubal ligation    . Pubovaginal sling    . Cataract extraction    . Melanoma excision      left leg  . Vena cava filter  placement  1997    [place] filter  . Knee arthroscopy  2006    rt   . Inguinal hernia repair Left 02/17/2014    Procedure: LEFT INGUINAL HERNIA REPAIR WITH MESH;  Surgeon: Harl Bowie, MD;  Location: [place];  Service: General;  Laterality: Left;  . Insertion of mesh N/A 02/17/2014    Procedure: INSERTION OF MESH;  Surgeon: Harl Bowie, MD;  Location: [place];  Service: General;  Laterality: N/A;   Social History:   reports that she quit smoking about 30 years ago. Her smoking use included Cigarettes. She has a 45 pack-year smoking history. She does not have any smokeless tobacco history on file. She reports that she drinks alcohol. She reports that she does not use illicit drugs.  Family History  Problem Relation Age of Onset  . Heart disease Father     stroke-deceased  . Stroke Father   . Congestive Heart Failure Mother     deceased - CHF  . Heart disease Mother   . Uterine cancer Sister   . Lymphoma Sister   . Skin cancer Sister     no melanoma    Medications: Patient's Medications  New Prescriptions   No medications on file  Previous Medications   ACETAMINOPHEN (TYLENOL) 325 MG TABLET  Take 2 tablets (650 mg total) by mouth every 6 (six) hours as needed for mild pain (or Fever >/= 101).   ADVAIR DISKUS 500-50 MCG/DOSE AEPB    INHALE 1 PUFF INTO LUNGS EVERY 12 HOURS   AMOXICILLIN-CLAVULANATE (AUGMENTIN) 875-125 MG PER TABLET    Take 1 tablet by mouth every 12 (twelve) hours. Through 08/28/14 then stop   B COMPLEX VITAMINS CAPSULE    Take 1 capsule by mouth daily.   CALCIUM-VITAMIN D (OSCAL WITH D) 500-200 MG-UNIT PER TABLET    Take 1 tablet by mouth daily with breakfast.   CHOLECALCIFEROL (VITAMIN D) 1000 UNITS TABLET    One daily for vitamin D supplement   FLUTICASONE (FLONASE) 50 MCG/ACT NASAL SPRAY    Place 1 spray into both nostrils daily.    FOLIC ACID (FOLVITE) 1 MG TABLET    Take 1 tablet (1 mg total) by mouth daily.     GUAIFENESIN (MUCINEX) 600 MG 12 HR TABLET    Take 600 mg by mouth 2 (two) times daily.   LATANOPROST (XALATAN) 0.005 % OPHTHALMIC SOLUTION    Place 1 drop into both eyes at bedtime.   LEVALBUTEROL (XOPENEX) 0.63 MG/3ML NEBULIZER SOLUTION    Take 3 mLs (0.63 mg total) by nebulization every 6 (six) hours as needed for wheezing or shortness of breath.   LORAZEPAM (ATIVAN) 0.5 MG TABLET    Take 1 tablet (0.5 mg total) by mouth every 6 (six) hours as needed for anxiety.   MULTIPLE VITAMINS-MINERALS (MULTIVITAMIN WITH MINERALS) TABLET    Take 1 tablet by mouth daily.   PANTOPRAZOLE (PROTONIX) 40 MG TABLET    Take 1 tablet (40 mg total) by mouth 2 (two) times daily.   PHENOL (CHLORASEPTIC) 1.4 % LIQD    Use as directed 1 spray in the mouth or throat as needed for throat irritation / pain.   POLYETHYLENE GLYCOL (MIRALAX / GLYCOLAX) PACKET    Take 17 g by mouth daily.   PREDNISONE (DELTASONE) 5 MG TABLET    Take 1 tablet (5 mg total) by mouth daily with breakfast. As directed   SACCHAROMYCES BOULARDII (FLORASTOR) 250 MG CAPSULE    Take 250 mg by mouth 2 (two) times daily.   SULFAMETHOXAZOLE-TRIMETHOPRIM (BACTRIM DS,SEPTRA DS) 800-160 MG PER TABLET    Take 1 tablet by mouth every Monday, Wednesday, and Friday. HOLD WHILE ON AUGMENTIN, THEN RESUME AS PREVIOUS   TIMOLOL (BETIMOL) 0.5 % OPHTHALMIC SOLUTION    Place 1 drop into the left eye daily with breakfast.    WARFARIN (COUMADIN) 2.5 MG TABLET    Take 2.5 mg by mouth daily at 6 PM.   Modified Medications   No medications on file  Discontinued Medications   No medications on file     Physical Exam: Physical Exam  Constitutional: She is oriented to person, place, and time. She appears well-developed and well-nourished. No distress.  HENT:  Head: Normocephalic and atraumatic.  Right Ear: External ear normal.  Left Ear: External ear normal.  Nose: Nose normal.  Mouth/Throat: Oropharynx is clear and moist. No oropharyngeal exudate.  Eyes:  Conjunctivae and EOM are normal. Pupils are equal, round, and reactive to light. Right eye exhibits no discharge. Left eye exhibits no discharge. No scleral icterus.  Neck: Normal range of motion. Neck supple. No JVD present. No tracheal deviation present. No thyromegaly present.  Cardiovascular: Normal rate, regular rhythm, normal heart sounds and intact distal pulses.   No murmur heard. Pulmonary/Chest: Effort normal and breath  sounds normal. No stridor. No respiratory distress. She has no wheezes. She has no rales. She exhibits no tenderness.  Abdominal: Soft. Bowel sounds are normal. She exhibits no distension and no mass. There is no tenderness. There is no rebound and no guarding.  Musculoskeletal: Normal range of motion. She exhibits edema. She exhibits no tenderness.  trace  Lymphadenopathy:    She has no cervical adenopathy.  Neurological: She is alert and oriented to person, place, and time. She has normal reflexes. No cranial nerve deficit. She exhibits normal muscle tone. Coordination normal.  Skin: Skin is warm and dry. No rash noted. She is not diaphoretic. No erythema. No pallor.  Psychiatric: She has a normal mood and affect. Her behavior is normal. Judgment and thought content normal.    Filed Vitals:   08/19/14 1444  BP: 118/70  Pulse: 88  Temp: 98.1 F (36.7 C)  TempSrc: Tympanic  Resp: 20      Labs reviewed: Basic Metabolic Panel:  Recent Labs  07/20/14 1713  07/28/14 0545  08/14/14 2105 08/15/14 0537 08/16/14 0341  NA  --   < > 141  < > 137 139 140  K  --   < > 3.6  < > 4.3 3.5 3.7  CL  --   < > 108  --  101 103 105  CO2  --   < > 28  --   --  28 25  GLUCOSE  --   < > 97  --  263* 95 88  BUN  --   < > 23  < > 36* 30* 27*  CREATININE  --   < > 0.63  < > 1.40* 1.14* 0.94  CALCIUM  --   < > 8.3*  --   --  8.9 9.6  MG 2.0  --  2.1  --   --   --   --   < > = values in this interval not displayed. Liver Function Tests:  Recent Labs  07/23/14 0400  07/26/14 0530 07/28/14 0545 08/01/14  AST 36 36 23 22  ALT 23 43* 39* 31  ALKPHOS 97 90 82 90  BILITOT 0.4 0.5 0.6  --   PROT 5.0* 5.0* 4.6*  --   ALBUMIN 2.3* 2.3* 2.2*  --    No results for input(s): LIPASE, AMYLASE in the last 8760 hours. No results for input(s): AMMONIA in the last 8760 hours. CBC:  Recent Labs  07/10/14 1201  07/24/14 0420  08/10/14 1539 08/14/14 2050 08/14/14 2105 08/15/14 0537 08/16/14 0341  WBC 5.1  < > 8.8  < > 10.0 10.4  --  9.6 6.5  NEUTROABS 3.4  --  8.1*  --  9.5*  --   --   --   --   HGB 9.0*  < > 8.1*  < > 11.3* 13.6 15.0 11.1* 10.9*  HCT 29.6*  < > 26.8*  < > 35.7 42.4 44.0 35.1* 34.9*  MCV 79.8  < > 80.0  < > 86 86.5  --  87.1 88.1  PLT 445*  < > 421*  < > 232 318  --  264 244  < > = values in this interval not displayed. Lipid Panel: No results for input(s): CHOL, HDL, LDLCALC, TRIG, CHOLHDL, LDLDIRECT in the last 8760 hours.  Past Procedures:  06/13/15 CT temporal bones:   IMPRESSION: BILATERAL middle ear and mastoid cholesteatomas are suspected. These changes are chronic and worse on the LEFT.  07/21/14  CT chest with contrast:   IMPRESSION: 1. There is a thin membranous appearing structure in the proximal trachea immediately beneath the level the vocal cords, which is of uncertain etiology and significance, but may account for the patient's upper airway wheezing on physical examination. Consideration for direct inspection with bronchoscopy is recommended if clinically appropriate. 2. The appearance of the chest suggests mild congestive heart failure, as above. 3. Multiple nodules throughout the lungs bilaterally (left greater than right), similar in appearance to numerous prior examinations, presumably sequela of underlying Wegner's granulomatosis. No new large cavitary lesion to suggest active parenchymal involvement at this time. 4. Mild cardiomegaly.  07/22/14 CT maxillofacial   IMPRESSION: 1. Wegner's granulomatosis  with pansinusitis and otomastoiditis described above. 2. Concerning the clinical question, there may be sinus fungal colonization but there is no visible mycetoma or invasive sinusitis.     Assessment/Plan Callus of foot Left foot-Podiatry 08/13/14   Acute sinusitis saline washes, hydration and humidified air. Patient also was started initially on Rocephin and then transitioned to Augmentin for acute on chronic sinusitis. Stop date 08/28/14   [facility] GRANULOMATOSIS Granulomatosis polyangitis but no clear evidence of active lung involvement 1/21, status post CT scan of the chest  Asthma, not in exacerbation Currently on prednisone 40 mg by mouth daily, taper prednisone slowly over the course of the next 2 weeks ENT eval > no evidence to support fungal sinus disease. He recommends to maximize humidity, Face shield with humidified oxygen, patient requiring 5-6 L of high flow oxygen to maintain airway humidification Continue Xopenex when necessary Continue Advair , Fungal culture, AFB, negative bronchoalveolar lavage culture shows MRSA Patient received about extremes of IV vancomycin, infectious disease consultation obtained and Dr. Megan Salon recommends Discontinue vancomycin and observe off of antibiotics 08/19/14 Septra DS M, W, F indefinitely upon completion of Augmentin.      GERD (gastroesophageal reflux disease) Continue PPI    Constipation Stable, takes MiraLax daily.     Acute respiratory distress Stabilized after mucous plugging resolved. Will maintain good hydration and tx acute on chronic sinusitis. Indefinitely Septra 3 x/wk for prophylactic therapy for respiratory infection in setting of Wegener's dx.    Anemia of chronic disease 08/01/14 Hgb 11.7    Edema Off diuretics.      Family/ Staff Communication: observe the patient  Goals of Care: [place]  Labs/tests ordered: none

## 2014-08-19 NOTE — Assessment & Plan Note (Signed)
Left foot-Podiatry 08/13/14

## 2014-08-19 NOTE — Assessment & Plan Note (Signed)
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 He recommends to maximize humidity, Face shield with humidified oxygen, patient requiring 5-6 L of high flow oxygen to maintain airway humidification Continue Xopenex when necessary Continue Advair , Fungal culture, AFB, negative bronchoalveolar lavage culture shows MRSA Patient received about extremes of IV vancomycin, infectious disease consultation obtained and Dr. Megan Salon recommends Discontinue vancomycin and observe off of antibiotics 08/19/14 Septra DS M, W, F indefinitely upon completion of Augmentin.

## 2014-08-19 NOTE — Assessment & Plan Note (Signed)
Stabilized after mucous plugging resolved. Will maintain good hydration and tx acute on chronic sinusitis. Indefinitely Septra 3 x/wk for prophylactic therapy for respiratory infection in setting of Wegener's dx.

## 2014-08-19 NOTE — Assessment & Plan Note (Signed)
Off diuretics 

## 2014-08-21 LAB — FUNGUS CULTURE W SMEAR
Fungal Smear: NONE SEEN
Fungal Smear: NONE SEEN

## 2014-08-24 ENCOUNTER — Telehealth: Payer: Self-pay | Admitting: Pulmonary Disease

## 2014-08-26 NOTE — Telephone Encounter (Signed)
So, it is unclear what I am supposed to do with this.  Does she want me to call her son, or is this just for my information??

## 2014-08-26 NOTE — Telephone Encounter (Signed)
Fax placed in [facility] look-at's.  Will forward to [place] to follow.

## 2014-08-26 NOTE — Telephone Encounter (Signed)
It looks like this info is just fyi.

## 2014-08-26 NOTE — Telephone Encounter (Signed)
I don't have anything in my folders on this.

## 2014-08-26 NOTE — Telephone Encounter (Signed)
Fax is in [facility] green folder now. Please advise thanks

## 2014-08-26 NOTE — Telephone Encounter (Signed)
Please advise KC thanks 

## 2014-09-02 ENCOUNTER — Telehealth: Payer: Self-pay | Admitting: Pulmonary Disease

## 2014-09-02 NOTE — Telephone Encounter (Signed)
lmomtcb x1 

## 2014-09-02 NOTE — Telephone Encounter (Signed)
lmomtcb x 1   We can schedule her an appt with TP on 3/11

## 2014-09-02 NOTE — Telephone Encounter (Signed)
Pt stated that she has been in the hospital x 2 and respiratory failure in the last month.  Seen by [name] and Dr. Lucia Gaskins.  Pt agreed to appt with TP on 3/11 at 2.

## 2014-09-10 ENCOUNTER — Non-Acute Institutional Stay: Payer: Medicare Other | Admitting: Nurse Practitioner

## 2014-09-10 VITALS — BP 138/68 | HR 80 | Temp 97.5°F | Ht 61.0 in | Wt 124.0 lb

## 2014-09-10 DIAGNOSIS — H9192 Unspecified hearing loss, left ear: Secondary | ICD-10-CM

## 2014-09-10 DIAGNOSIS — Z86711 Personal history of pulmonary embolism: Secondary | ICD-10-CM

## 2014-09-10 DIAGNOSIS — K219 Gastro-esophageal reflux disease without esophagitis: Secondary | ICD-10-CM

## 2014-09-10 DIAGNOSIS — M3130 Wegener's granulomatosis without renal involvement: Secondary | ICD-10-CM | POA: Diagnosis not present

## 2014-09-10 DIAGNOSIS — F411 Generalized anxiety disorder: Secondary | ICD-10-CM | POA: Diagnosis not present

## 2014-09-10 DIAGNOSIS — N3942 Incontinence without sensory awareness: Secondary | ICD-10-CM

## 2014-09-10 DIAGNOSIS — R609 Edema, unspecified: Secondary | ICD-10-CM

## 2014-09-10 DIAGNOSIS — J329 Chronic sinusitis, unspecified: Secondary | ICD-10-CM | POA: Diagnosis not present

## 2014-09-10 NOTE — Assessment & Plan Note (Signed)
spect fungal sinusitis  Staph species 1/2 BC > likely contaminant Doubt HCAP BCx2 1/18 >> few coag neg staph species 1/2  UC 1/19 >> neg BAL 1/20 >> few GPC in clusters, MRSA AFB (bronch) 1/20 >>  Fungal (bronch) 1/20 >>  Cytology (fibrinous mass) 1/20 >>  Culture (finbrinous mass) 1/20 >> MRSA Fungal (fibrinous mass) 1/20 >> 09/10/14 continue saline nasal spray.

## 2014-09-10 NOTE — Progress Notes (Signed)
Patient ID: [name], female   DOB: 1933/04/20, 81 y.o.   MRN: [id number]   Code Status: DNR  Allergies  Allergen Reactions  . Adhesive [Tape] Rash    Chief Complaint  Patient presents with  . Medical Management of Chronic Issues    New Patient  needs Cardiology referral     HPI: Patient is a 81 y.o. female seen in the clinic at [facility] today for chronic medical conditions.  Problem List Items Addressed This Visit    WEGENERS GRANULOMATOSIS - Primary (Chronic)    Granulomatosis polyangitis but no clear evidence of active lung involvement 1/21, status post CT scan of the chest  Asthma, not in exacerbation Currently on prednisone 40 mg by mouth daily, taper prednisone slowly over the course of the next 2 weeks ENT eval > no evidence to support fungal sinus disease. He recommends to maximize humidity, Face shield with humidified oxygen, patient requiring 5-6 L of high flow oxygen to maintain airway humidification Continue Xopenex when necessary Continue Advair , Fungal culture, AFB, negative bronchoalveolar lavage culture shows MRSA Patient received about extremes of IV vancomycin, infectious disease consultation obtained and Dr. Megan Salon recommends Discontinue vancomycin and observe off of antibiotics 08/19/14 Septra DS M, W, F indefinitely upon completion of Augmentin.   09/10/14 continue Septra DS 3x/week.        Relevant Medications   warfarin (COUMADIN) 3 MG tablet   Urine incontinence    4-5x/night-underwent 2-3 urologists-adult depends       Sinusitis, chronic (Chronic)    spect fungal sinusitis  Staph species 1/2 BC > likely contaminant Doubt HCAP BCx2 1/18 >> few coag neg staph species 1/2  UC 1/19 >> neg BAL 1/20 >> few GPC in clusters, MRSA AFB (bronch) 1/20 >>  Fungal (bronch) 1/20 >>  Cytology (fibrinous mass) 1/20 >>  Culture (finbrinous mass) 1/20 >> MRSA Fungal (fibrinous mass) 1/20 >> 09/10/14 continue saline nasal spray.          Relevant Medications   sodium chloride (OCEAN) 0.65 % SOLN nasal spray   Personal history of PE (pulmonary embolism) (Chronic)    Chronic anticoagulation with Coumadin      GERD (gastroesophageal reflux disease)    Stable.       Generalized anxiety disorder    Lorazepam 0.5mg  q6h prn      Edema (Chronic)    Off diuretics. Only trace edema in ankles-no longer needing TED. Consider Cardiology referral to r/o cardiac related conditions.        Deaf    Deaf left ear-right ear [place] even if with hearing aid.          Review of Systems:  Review of Systems  Constitutional: Negative for fever, chills and diaphoresis.  HENT: Positive for hearing loss. Negative for congestion, ear discharge, ear pain, nosebleeds, sore throat and tinnitus.   Eyes: Negative for photophobia, pain, discharge and redness.  Respiratory: Positive for cough and shortness of breath. Negative for wheezing and stridor.   Cardiovascular: Positive for leg swelling. Negative for chest pain and palpitations.  Gastrointestinal: Negative for nausea, vomiting, abdominal pain, diarrhea, constipation and blood in stool.  Endocrine: Negative for polydipsia.  Genitourinary: Positive for frequency. Negative for dysuria, urgency, hematuria and flank pain.  Musculoskeletal: Negative for myalgias, back pain and neck pain.  Skin: Negative for rash.  Allergic/Immunologic: Negative for environmental allergies.  Neurological: Negative for dizziness, tremors, seizures, weakness and headaches.  Hematological: Bruises/bleeds easily.  Psychiatric/Behavioral: Negative for suicidal  ideas and hallucinations. The patient is nervous/anxious.       Past Medical History  Diagnosis Date  . Wegener's granulomatosis   . OA (osteoarthritis)   . Blindness of one eye   . Hearing loss in left ear     hearing aid both ears  . Bladder incontinence   . Glaucoma   . Clotting disorder   . History of pulmonary embolism 1997  .  Shortness of breath dyspnea   . Pneumonia 12/15  . Multiple allergies   . Chronic sinusitis   . Acute respiratory failure with hypoxia   . Interstitial emphysema   . Aspergillosis    Past Surgical History  Procedure Laterality Date  . Tubal ligation    . Pubovaginal sling    . Cataract extraction    . Melanoma excision      left leg  . Vena cava filter placement  1997    [place] filter  . Knee arthroscopy  2006    rt   . Inguinal hernia repair Left 02/17/2014    Procedure: LEFT INGUINAL HERNIA REPAIR WITH MESH;  Surgeon: Harl Bowie, MD;  Location: [place];  Service: General;  Laterality: Left;  . Insertion of mesh N/A 02/17/2014    Procedure: INSERTION OF MESH;  Surgeon: Harl Bowie, MD;  Location: [place];  Service: General;  Laterality: N/A;   Social History:   reports that she quit smoking about 30 years ago. Her smoking use included Cigarettes. She has a 45 pack-year smoking history. She has never used smokeless tobacco. She reports that she drinks alcohol. She reports that she does not use illicit drugs.  Family History  Problem Relation Age of Onset  . Heart disease Father     stroke-deceased  . Stroke Father   . Congestive Heart Failure Mother     deceased - CHF  . Heart disease Mother   . Cancer Sister   . Dementia Sister   . Cancer Sister     skin    Medications: Patient's Medications  New Prescriptions   No medications on file  Previous Medications   ACETAMINOPHEN (TYLENOL) 325 MG TABLET    Take 2 tablets (650 mg total) by mouth every 6 (six) hours as needed for mild pain (or Fever >/= 101).   ADVAIR DISKUS 500-50 MCG/DOSE AEPB    INHALE 1 PUFF INTO LUNGS EVERY 12 HOURS   B COMPLEX VITAMINS CAPSULE    Take 1 capsule by mouth daily.   CALCIUM-VITAMIN D (OSCAL WITH D) 500-200 MG-UNIT PER TABLET    Take 1 tablet by mouth daily with breakfast.   CHOLECALCIFEROL (VITAMIN D) 1000 UNITS TABLET    One daily for  vitamin D supplement   FLUTICASONE (FLONASE) 50 MCG/ACT NASAL SPRAY    Place 1 spray into both nostrils daily.    FOLIC ACID (FOLVITE) 1 MG TABLET    Take 1 tablet (1 mg total) by mouth daily.   GUAIFENESIN (MUCINEX) 600 MG 12 HR TABLET    Take 600 mg by mouth 2 (two) times daily.   LATANOPROST (XALATAN) 0.005 % OPHTHALMIC SOLUTION    Place 1 drop into both eyes at bedtime.   LORAZEPAM (ATIVAN) 0.5 MG TABLET    Take 1 tablet (0.5 mg total) by mouth every 6 (six) hours as needed for anxiety.   MULTIPLE VITAMINS-MINERALS (MULTIVITAMIN WITH MINERALS) TABLET    Take 1 tablet by mouth daily.   PANTOPRAZOLE (PROTONIX) 40 MG  TABLET    Take 1 tablet (40 mg total) by mouth 2 (two) times daily.   PREDNISONE (DELTASONE) 5 MG TABLET    Take 1 tablet (5 mg total) by mouth daily with breakfast. As directed   SACCHAROMYCES BOULARDII (FLORASTOR) 250 MG CAPSULE    Take 250 mg by mouth 2 (two) times daily.   SODIUM CHLORIDE (OCEAN) 0.65 % SOLN NASAL SPRAY    Place 1 spray into both nostrils. One spray both nostrils as directed   SULFAMETHOXAZOLE-TRIMETHOPRIM (BACTRIM DS,SEPTRA DS) 800-160 MG PER TABLET    Take 1 tablet by mouth every Monday, Wednesday, and Friday. HOLD WHILE ON AUGMENTIN, THEN RESUME AS PREVIOUS   TIMOLOL (BETIMOL) 0.5 % OPHTHALMIC SOLUTION    Place 1 drop into the left eye daily with breakfast.    WARFARIN (COUMADIN) 2.5 MG TABLET    Take 2.5 mg by mouth daily at 6 PM.    WARFARIN (COUMADIN) 3 MG TABLET    Take one tablet daily  Modified Medications   No medications on file  Discontinued Medications   AMOXICILLIN-CLAVULANATE (AUGMENTIN) 875-125 MG PER TABLET    Take 1 tablet by mouth every 12 (twelve) hours. Through 08/28/14 then stop   LEVALBUTEROL (XOPENEX) 0.63 MG/3ML NEBULIZER SOLUTION    Take 3 mLs (0.63 mg total) by nebulization every 6 (six) hours as needed for wheezing or shortness of breath.   PHENOL (CHLORASEPTIC) 1.4 % LIQD    Use as directed 1 spray in the mouth or throat as needed for  throat irritation / pain.   POLYETHYLENE GLYCOL (MIRALAX / GLYCOLAX) PACKET    Take 17 g by mouth daily.     Physical Exam: Physical Exam  Constitutional: She is oriented to person, place, and time. She appears well-developed and well-nourished. No distress.  HENT:  Head: Normocephalic and atraumatic.  Right Ear: External ear normal.  Left Ear: External ear normal.  Nose: Nose normal.  Mouth/Throat: Oropharynx is clear and moist. No oropharyngeal exudate.  Eyes: Conjunctivae and EOM are normal. Pupils are equal, round, and reactive to light. Right eye exhibits no discharge. Left eye exhibits no discharge. No scleral icterus.  Neck: Normal range of motion. Neck supple. No JVD present. No tracheal deviation present. No thyromegaly present.  Cardiovascular: Normal rate, regular rhythm, normal heart sounds and intact distal pulses.   No murmur heard. Pulmonary/Chest: Effort normal and breath sounds normal. No stridor. No respiratory distress. She has no wheezes. She has no rales. She exhibits no tenderness.  Abdominal: Soft. Bowel sounds are normal. She exhibits no distension and no mass. There is no tenderness. There is no rebound and no guarding.  Musculoskeletal: Normal range of motion. She exhibits edema. She exhibits no tenderness.  trace  Lymphadenopathy:    She has no cervical adenopathy.  Neurological: She is alert and oriented to person, place, and time. She has normal reflexes. No cranial nerve deficit. She exhibits normal muscle tone. Coordination normal.  Skin: Skin is warm and dry. No rash noted. She is not diaphoretic. No erythema. No pallor.  Psychiatric: She has a normal mood and affect. Her behavior is normal. Judgment and thought content normal.    Filed Vitals:   09/10/14 1623  BP: 138/68  Pulse: 80  Temp: 97.5 F (36.4 C)  TempSrc: Oral  Height: 5\' 1"  (1.549 m)  Weight: 124 lb (56.246 kg)  SpO2: 97%      Labs reviewed: Basic Metabolic Panel:  Recent  Labs  07/20/14 1713  07/28/14  3329  08/14/14 2105 08/15/14 0537 08/16/14 0341  NA  --   < > 141  < > 137 139 140  K  --   < > 3.6  < > 4.3 3.5 3.7  CL  --   < > 108  --  101 103 105  CO2  --   < > 28  --   --  28 25  GLUCOSE  --   < > 97  --  263* 95 88  BUN  --   < > 23  < > 36* 30* 27*  CREATININE  --   < > 0.63  < > 1.40* 1.14* 0.94  CALCIUM  --   < > 8.3*  --   --  8.9 9.6  MG 2.0  --  2.1  --   --   --   --   < > = values in this interval not displayed. Liver Function Tests:  Recent Labs  07/23/14 0400 07/26/14 0530 07/28/14 0545 08/01/14  AST 36 36 23 22  ALT 23 43* 39* 31  ALKPHOS 97 90 82 90  BILITOT 0.4 0.5 0.6  --   PROT 5.0* 5.0* 4.6*  --   ALBUMIN 2.3* 2.3* 2.2*  --    No results for input(s): LIPASE, AMYLASE in the last 8760 hours. No results for input(s): AMMONIA in the last 8760 hours. CBC:  Recent Labs  07/10/14 1201  07/24/14 0420  08/10/14 1539 08/14/14 2050 08/14/14 2105 08/15/14 0537 08/16/14 0341  WBC 5.1  < > 8.8  < > 10.0 10.4  --  9.6 6.5  NEUTROABS 3.4  --  8.1*  --  9.5*  --   --   --   --   HGB 9.0*  < > 8.1*  < > 11.3* 13.6 15.0 11.1* 10.9*  HCT 29.6*  < > 26.8*  < > 35.7 42.4 44.0 35.1* 34.9*  MCV 79.8  < > 80.0  < > 86 86.5  --  87.1 88.1  PLT 445*  < > 421*  < > 232 318  --  264 244  < > = values in this interval not displayed. Lipid Panel: No results for input(s): CHOL, HDL, LDLCALC, TRIG, CHOLHDL, LDLDIRECT in the last 8760 hours. Anemia Panel:  Recent Labs  07/28/14 1115 08/10/14 1539  FOLATE 12.4  --   IRON 34* 57  VITAMINB12 675  --     Past Procedures: 06/13/15 CT temporal bones:   IMPRESSION: BILATERAL middle ear and mastoid cholesteatomas are suspected. These changes are chronic and worse on the LEFT.  07/21/14 CT chest with contrast:   IMPRESSION: 1. There is a thin membranous appearing structure in the proximal trachea immediately beneath the level the vocal cords, which is of uncertain etiology and  significance, but may account for the patient's upper airway wheezing on physical examination. Consideration for direct inspection with bronchoscopy is recommended if clinically appropriate. 2. The appearance of the chest suggests mild congestive heart failure, as above. 3. Multiple nodules throughout the lungs bilaterally (left greater than right), similar in appearance to numerous prior examinations, presumably sequela of underlying Wegner's granulomatosis. No new large cavitary lesion to suggest active parenchymal involvement at this time. 4. Mild cardiomegaly.  07/22/14 CT maxillofacial   IMPRESSION: 1. Wegner's granulomatosis with pansinusitis and otomastoiditis described above. 2. Concerning the clinical question, there may be sinus fungal colonization but there is no visible mycetoma or invasive sinusitis.  Assessment/Plan WEGENERS GRANULOMATOSIS Granulomatosis  polyangitis but no clear evidence of active lung involvement 1/21, status post CT scan of the chest  Asthma, not in exacerbation Currently on prednisone 40 mg by mouth daily, taper prednisone slowly over the course of the next 2 weeks ENT eval > no evidence to support fungal sinus disease. He recommends to maximize humidity, Face shield with humidified oxygen, patient requiring 5-6 L of high flow oxygen to maintain airway humidification Continue Xopenex when necessary Continue Advair , Fungal culture, AFB, negative bronchoalveolar lavage culture shows MRSA Patient received about extremes of IV vancomycin, infectious disease consultation obtained and Dr. Megan Salon recommends Discontinue vancomycin and observe off of antibiotics 08/19/14 Septra DS M, W, F indefinitely upon completion of Augmentin.   09/10/14 continue Septra DS 3x/week.     Sinusitis, chronic spect fungal sinusitis  Staph species 1/2 BC > likely contaminant Doubt HCAP BCx2 1/18 >> few coag neg staph species 1/2  UC 1/19 >> neg BAL 1/20 >> few  GPC in clusters, MRSA AFB (bronch) 1/20 >>  Fungal (bronch) 1/20 >>  Cytology (fibrinous mass) 1/20 >>  Culture (finbrinous mass) 1/20 >> MRSA Fungal (fibrinous mass) 1/20 >> 09/10/14 continue saline nasal spray.     Edema Off diuretics. Only trace edema in ankles-no longer needing TED. Consider Cardiology referral to r/o cardiac related conditions.     Deaf Deaf left ear-right ear [place] even if with hearing aid.    Urine incontinence 4-5x/night-underwent 2-3 urologists-adult depends    Personal history of PE (pulmonary embolism) Chronic anticoagulation with Coumadin   GERD (gastroesophageal reflux disease) Stable.    Generalized anxiety disorder Lorazepam 0.5mg  q6h prn     Family/ Staff Communication: observe the patient. Cardiology referral.   Goals of Care: IL  Labs/tests ordered: none

## 2014-09-10 NOTE — Assessment & Plan Note (Signed)
Lorazepam 0.5mg  q6h prn

## 2014-09-10 NOTE — Assessment & Plan Note (Signed)
Stable

## 2014-09-10 NOTE — Assessment & Plan Note (Signed)
4-5x/night-underwent 2-3 urologists-adult depends

## 2014-09-10 NOTE — Assessment & Plan Note (Signed)
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 He recommends to maximize humidity, Face shield with humidified oxygen, patient requiring 5-6 L of high flow oxygen to maintain airway humidification Continue Xopenex when necessary Continue Advair , Fungal culture, AFB, negative bronchoalveolar lavage culture shows MRSA Patient received about extremes of IV vancomycin, infectious disease consultation obtained and Dr. Megan Salon recommends Discontinue vancomycin and observe off of antibiotics 08/19/14 Septra DS M, W, F indefinitely upon completion of Augmentin.   09/10/14 continue Septra DS 3x/week.

## 2014-09-10 NOTE — Assessment & Plan Note (Signed)
Chronic anticoagulation with Coumadin

## 2014-09-10 NOTE — Assessment & Plan Note (Signed)
Off diuretics. Only trace edema in ankles-no longer needing TED. Consider Cardiology referral to r/o cardiac related conditions.

## 2014-09-10 NOTE — Assessment & Plan Note (Signed)
Deaf left ear-right ear [place] even if with hearing aid.

## 2014-09-11 ENCOUNTER — Encounter: Payer: Self-pay | Admitting: Adult Health

## 2014-09-11 ENCOUNTER — Ambulatory Visit (INDEPENDENT_AMBULATORY_CARE_PROVIDER_SITE_OTHER): Payer: Medicare Other | Admitting: Adult Health

## 2014-09-11 VITALS — BP 122/66 | HR 80 | Temp 98.2°F | Ht 61.0 in | Wt 124.0 lb

## 2014-09-11 DIAGNOSIS — J328 Other chronic sinusitis: Secondary | ICD-10-CM

## 2014-09-11 DIAGNOSIS — J4520 Mild intermittent asthma, uncomplicated: Secondary | ICD-10-CM

## 2014-09-11 NOTE — Progress Notes (Signed)
   Subjective:    Patient ID: [name], female    DOB: [birth year], 81 y.o.   MRN: [id number]  HPI 81 yo with chronic pansinusitis, Wegener's granulomatosis, and airflow obstruction related to bronchial stenosis and a probable component of asthma. Sputum positive for MRSA and MAC.   09/11/2014 [facility] follow up  Pt returns for post hospital follow up  Admitted 2/12 -2/16 for acute on chronic sinusitis , mucus plugging with acute resp failure  tx w/ aggressive BD nebs, abx, . Discharged on augmentin .  She is feeling better but still weak. Has cough with white to yellow mucus on/off.  Feels she is slowly improving .  No fever, chest pain,orthopnea, edema or fever.     Review of Systems  Constitutional: Negative for fever and unexpected weight change.  HENT: Positive for congestion. Negative for dental problem, ear pain, nosebleeds, postnasal drip, rhinorrhea, sinus pressure, sneezing, sore throat and trouble swallowing.   Eyes: Negative for redness and itching.  Respiratory: Positive for cough. Negative for chest tightness, shortness of breath and wheezing.   Cardiovascular: Negative for palpitations and leg swelling.  Gastrointestinal: Negative for nausea and vomiting.  Genitourinary: Negative for dysuria.  Musculoskeletal: Negative for joint swelling.  Skin: Negative for rash.  Neurological: Negative for headaches.  Hematological: Does not bruise/bleed easily.  Psychiatric/Behavioral: Negative for dysphoric mood. The patient is not nervous/anxious.        Objective:   Physical Exam Frail-appearing female, hard of hearing, and no acute distress Nose without purulence or discharge noted Neck without lymphadenopathy or thyromegaly Chest with good airflow, and clear breath sounds except for mild coarseness, no wheezing Cardiac exam with regular rate and rhythm Lower extremities with no significant edema, no cyanosis Alert and oriented, moves all 4 extremities.      Assessment & Plan:

## 2014-09-11 NOTE — Patient Instructions (Addendum)
Continue on current regimen.  May rinse gargle after use Advair  Follow up [name] in 6 weeks and As needed

## 2014-09-14 ENCOUNTER — Telehealth: Payer: Self-pay | Admitting: *Deleted

## 2014-09-14 MED ORDER — WARFARIN SODIUM 4 MG PO TABS: ORAL_TABLET | ORAL | Status: DC

## 2014-09-14 NOTE — Telephone Encounter (Signed)
I tried calling patient because [place] #: [phone] sent over a fax refill for patient's Warfarin dosage change to 4mg . I didn't see where we made that change. Called patient to ask who checks her INR. LMOM to return call.

## 2014-09-14 NOTE — Telephone Encounter (Signed)
Spoke with patient and she stated that [facility] checks her Protime and she needs Warfarin 4mg  because she is out. [place] Newcomer I tried calling ManXie Mast but didn't get her. Henderson Newcomer. Going to call [name] regarding the Protime. [name] stated that patient is on 4mg  of Warfarin and stated that she has been doing the Protime and giving it to [facility] to address. Ok to fax refill. Faxed to pharmacy.

## 2014-09-16 ENCOUNTER — Ambulatory Visit: Payer: Medicare Other | Admitting: Pulmonary Disease

## 2014-09-16 NOTE — Assessment & Plan Note (Signed)
Recent flare with mucus plugging and sinusitis-improved  Cont on current regimen

## 2014-09-16 NOTE — Assessment & Plan Note (Signed)
Recent flare now resolved  Cont on current regimen   

## 2014-09-21 LAB — POCT INR: INR: 2.8 — AB (ref 0.9–1.1)

## 2014-09-22 ENCOUNTER — Telehealth: Payer: Self-pay | Admitting: *Deleted

## 2014-09-22 ENCOUNTER — Other Ambulatory Visit (HOSPITAL_BASED_OUTPATIENT_CLINIC_OR_DEPARTMENT_OTHER): Payer: Medicare Other | Admitting: Lab

## 2014-09-22 ENCOUNTER — Ambulatory Visit (HOSPITAL_BASED_OUTPATIENT_CLINIC_OR_DEPARTMENT_OTHER): Payer: Medicare Other | Admitting: Family

## 2014-09-22 VITALS — BP 154/78 | HR 84 | Temp 98.4°F | Wt 125.0 lb

## 2014-09-22 DIAGNOSIS — D638 Anemia in other chronic diseases classified elsewhere: Secondary | ICD-10-CM

## 2014-09-22 DIAGNOSIS — D509 Iron deficiency anemia, unspecified: Secondary | ICD-10-CM

## 2014-09-22 DIAGNOSIS — D500 Iron deficiency anemia secondary to blood loss (chronic): Secondary | ICD-10-CM

## 2014-09-22 DIAGNOSIS — M3130 Wegener's granulomatosis without renal involvement: Secondary | ICD-10-CM

## 2014-09-22 LAB — FERRITIN CHCC: Ferritin: 184 ng/ml (ref 9–269)

## 2014-09-22 LAB — CBC WITH DIFFERENTIAL (CANCER CENTER ONLY)
BASO#: 0 10*3/uL (ref 0.0–0.2)
BASO%: 0.5 % (ref 0.0–2.0)
EOS ABS: 0.1 10*3/uL (ref 0.0–0.5)
EOS%: 1.8 % (ref 0.0–7.0)
HCT: 39.1 % (ref 34.8–46.6)
HGB: 12.5 g/dL (ref 11.6–15.9)
LYMPH#: 0.8 10*3/uL — ABNORMAL LOW (ref 0.9–3.3)
LYMPH%: 13.9 % — ABNORMAL LOW (ref 14.0–48.0)
MCH: 31.1 pg (ref 26.0–34.0)
MCHC: 32 g/dL (ref 32.0–36.0)
MCV: 97 fL (ref 81–101)
MONO#: 0.8 10*3/uL (ref 0.1–0.9)
MONO%: 12.8 % (ref 0.0–13.0)
NEUT%: 71 % (ref 39.6–80.0)
NEUTROS ABS: 4.2 10*3/uL (ref 1.5–6.5)
PLATELETS: 242 10*3/uL (ref 145–400)
RBC: 4.02 10*6/uL (ref 3.70–5.32)
RDW: 22.8 % — ABNORMAL HIGH (ref 11.1–15.7)
WBC: 6 10*3/uL (ref 3.9–10.0)

## 2014-09-22 LAB — CHCC SATELLITE - SMEAR

## 2014-09-22 LAB — IRON AND TIBC CHCC
%SAT: 37 % (ref 21–57)
IRON: 100 ug/dL (ref 41–142)
TIBC: 266 ug/dL (ref 236–444)
UIBC: 167 ug/dL (ref 120–384)

## 2014-09-22 LAB — RETICULOCYTES (CHCC)
ABS Retic: 60.8 10*3/uL (ref 19.0–186.0)
RBC.: 4.05 MIL/uL (ref 3.87–5.11)
RETIC CT PCT: 1.5 % (ref 0.4–2.3)

## 2014-09-22 NOTE — Telephone Encounter (Signed)
Received Protime Results from [facility]  INR:  2.8  Current Dose of Coumadin: 4mg  everyday Patient takes Bactrim DS routinely on M,W,F started on 08/05/14--indefinitely.  Given results to Dr. Bubba Camp to review and sign.

## 2014-09-22 NOTE — Telephone Encounter (Signed)
Per Dr. Elmarie Mainland with 4mg  daily and recheck on 10/05/14. Patient notified and faxed to [facility] to [place] Fax: [phone]

## 2014-09-22 NOTE — Progress Notes (Signed)
Hematology and Oncology Follow Up Visit  [name] [id number] [birth year] 81 y.o. 09/22/2014   Principle Diagnosis:  Recurrent iron deficiency anemia Wegener's granulomatosis  Current Therapy:   IV iron as indicated    Interim History:  [name] is here for a follow-up. She is feeling better. She was hospitalized in January with respiratory failure and then spent some time at [place] home recuperating. Her breathing has improved. She denies SOB at this time.  She has had some financial stress at home. Her husband required 24 hour care while she was ill. She says that he is doing better with his alzheimer's disease.  She is seeing her podiatrist once a week for swelling and a sore on her right 3rd toe.  She denies fatigue, fever, chills, n/v, chest pain, palpitations, abdominal pain, constipation, diarrhea, blood in urine or stool. She has a chronic cough from the wegener's granulomatosis.  No episodes of bleeding.  No tenderness numbness or tingling in her extremities.  Her appetite is good and she is staying hydrated. She is gaining a little weight back.  Her son-in-law is a pulmonologist and came in to help take care of her while she was sick.  Medications:    Medication List       This list is accurate as of: 09/22/14  1:06 PM.  Always use your most recent med list.               acetaminophen 325 MG tablet  Commonly known as:  TYLENOL  Take 2 tablets (650 mg total) by mouth every 6 (six) hours as needed for mild pain (or Fever >/= 101).     ADVAIR DISKUS 500-50 MCG/DOSE Aepb  Generic drug:  Fluticasone-Salmeterol  INHALE 1 PUFF INTO LUNGS EVERY 12 HOURS     b complex vitamins capsule  Take 1 capsule by mouth daily.     calcium-vitamin D 500-200 MG-UNIT per tablet  Commonly known as:  OSCAL WITH D  Take 1 tablet by mouth daily with breakfast.     cholecalciferol 1000 UNITS tablet  Commonly known as:  VITAMIN D  One daily for vitamin D supplement     fluticasone 50 MCG/ACT nasal spray  Commonly known as:  FLONASE  Place 1 spray into both nostrils daily.     folic acid 1 MG tablet  Commonly known as:  FOLVITE  Take 1 tablet (1 mg total) by mouth daily.     guaiFENesin 600 MG 12 hr tablet  Commonly known as:  MUCINEX  Take 600 mg by mouth 2 (two) times daily.     latanoprost 0.005 % ophthalmic solution  Commonly known as:  XALATAN  Place 1 drop into both eyes at bedtime.     levalbuterol 0.63 MG/3ML nebulizer solution  Commonly known as:  XOPENEX  Take 3 mLs by nebulization every 6 (six) hours as needed. Wheezing, shortness of breath     LORazepam 0.5 MG tablet  Commonly known as:  ATIVAN  Take 1 tablet (0.5 mg total) by mouth every 6 (six) hours as needed for anxiety.     multivitamin with minerals tablet  Take 1 tablet by mouth daily.     pantoprazole 40 MG tablet  Commonly known as:  PROTONIX  Take 1 tablet (40 mg total) by mouth 2 (two) times daily.     predniSONE 5 MG tablet  Commonly known as:  DELTASONE  Take 1 tablet (5 mg total) by mouth daily with breakfast. As directed  saccharomyces boulardii 250 MG capsule  Commonly known as:  FLORASTOR  Take 250 mg by mouth 2 (two) times daily.     sodium chloride 0.65 % Soln nasal spray  Commonly known as:  OCEAN  Place 1 spray into both nostrils. One spray both nostrils as directed     sulfamethoxazole-trimethoprim 800-160 MG per tablet  Commonly known as:  BACTRIM DS,SEPTRA DS  Take 1 tablet by mouth every Monday, Wednesday, and Friday. HOLD WHILE ON AUGMENTIN, THEN RESUME AS PREVIOUS     timolol 0.5 % ophthalmic solution  Commonly known as:  BETIMOL  Place 1 drop into the left eye daily with breakfast.     warfarin 4 MG tablet  Commonly known as:  COUMADIN  Take one tablet by mouth once daily for anticoagulation        Allergies:  Allergies  Allergen Reactions  . Adhesive [Tape] Rash    Past Medical History, Surgical history, Social history, and  Family History were reviewed and updated.  Review of Systems: All other 10 point review of systems is negative.   Physical Exam:  weight is 125 lb (56.7 kg). Her oral temperature is 98.4 F (36.9 C). Her blood pressure is 154/78 and her pulse is 84.   Wt Readings from Last 3 Encounters:  09/22/14 125 lb (56.7 kg)  09/11/14 124 lb (56.246 kg)  09/10/14 124 lb (56.246 kg)    Ocular: Sclerae unicteric, pupils equal, round and reactive to light Ear-nose-throat: Oropharynx clear, dentition fair Lymphatic: No cervical or supraclavicular adenopathy Lungs no rales or rhonchi, good excursion bilaterally Heart regular rate and rhythm, no murmur appreciated Abd soft, nontender, positive bowel sounds MSK no focal spinal tenderness, no joint edema Neuro: non-focal, well-oriented, appropriate affect Breasts: Deferred  Lab Results  Component Value Date   WBC 6.0 09/22/2014   HGB 12.5 09/22/2014   HCT 39.1 09/22/2014   MCV 97 09/22/2014   PLT 242 09/22/2014   Lab Results  Component Value Date   FERRITIN 1,199* 08/10/2014   IRON 57 08/10/2014   TIBC 263 08/10/2014   UIBC 205 08/10/2014   IRONPCTSAT 22 08/10/2014   Lab Results  Component Value Date   RETICCTPCT 2.0 08/10/2014   RBC 4.02 09/22/2014   RETICCTABS 86.4 08/10/2014   No results found for: KPAFRELGTCHN, LAMBDASER, KAPLAMBRATIO No results found for: IGGSERUM, IGA, IGMSERUM No results found for: Odetta Pink, SPEI   Chemistry      Component Value Date/Time   NA 140 08/16/2014 0341   NA 144 08/01/2014   K 3.7 08/16/2014 0341   CL 105 08/16/2014 0341   CO2 25 08/16/2014 0341   BUN 27* 08/16/2014 0341   BUN 13 08/01/2014   CREATININE 0.94 08/16/2014 0341   CREATININE 0.6 08/01/2014   GLU 91 08/01/2014      Component Value Date/Time   CALCIUM 9.6 08/16/2014 0341   ALKPHOS 90 08/01/2014   AST 22 08/01/2014   ALT 31 08/01/2014   BILITOT 0.6 07/28/2014 0545       Impression and Plan: [name] is 81 year old female with iron deficiency anemia and a history of wegener's granulomatosis. She did well after receiving blood and iron in January. She is feeling better and has no complaints at this time.  Her CBC today is good. We will see what her iron studies show. If she happens to need a dose of Madilyn Hook we will bring her back in later this week if she  is free.  We will see her back in 2 months for labs and follow-up.  She knows to call with any questions or concerns. We can certainly see her sooner if need be.   Eliezer Bottom, NP 3/22/20161:06 PM

## 2014-09-24 ENCOUNTER — Other Ambulatory Visit: Payer: Self-pay | Admitting: Dermatology

## 2014-09-24 LAB — AFB CULTURE WITH SMEAR (NOT AT ARMC): ACID FAST SMEAR: NONE SEEN

## 2014-09-29 ENCOUNTER — Other Ambulatory Visit: Payer: Self-pay | Admitting: *Deleted

## 2014-09-29 DIAGNOSIS — D500 Iron deficiency anemia secondary to blood loss (chronic): Secondary | ICD-10-CM

## 2014-09-29 MED ORDER — FOLIC ACID 1 MG PO TABS: 1.0000 mg | ORAL_TABLET | Freq: Every day | ORAL | Status: DC

## 2014-10-01 ENCOUNTER — Telehealth: Payer: Self-pay | Admitting: Pulmonary Disease

## 2014-10-01 ENCOUNTER — Encounter: Payer: Self-pay | Admitting: Pulmonary Disease

## 2014-10-01 NOTE — Telephone Encounter (Signed)
Mindy, please call Dr. Mickie Hillier office and see if they ever biopsied this patient's nasal mucosa?? Thanks.

## 2014-10-01 NOTE — Telephone Encounter (Signed)
Called spoke with Dr. Pollie Friar office's. They are faxing over a path report but not sure if this is what we are wanting. This is from 2006.  lmtcb x1 for pt Did her and Dr. Lucia Gaskins discuss having another bx done on nasal mucosa?

## 2014-10-02 NOTE — Telephone Encounter (Signed)
I called spoke with pt. She reports that she recalls this has not been discussed.

## 2014-10-05 ENCOUNTER — Telehealth: Payer: Self-pay | Admitting: *Deleted

## 2014-10-05 LAB — POCT INR: INR: 2.5 — AB (ref 0.9–1.1)

## 2014-10-05 NOTE — Telephone Encounter (Signed)
Received fax Protime from [facility] for Protime 10/05/2014  INR:  2.5  Current Dose of Coumadin is 4mg  daily. Patient does take Bactrim DS routinely on MWF-started on 08/05/14 through indefinitely. Given to Sherrie Mustache to sign off.

## 2014-10-06 NOTE — Telephone Encounter (Signed)
Per Jessica---Continue same dose of Coumadin and recheck in 2 weeks. Faxed back to [facility] and patient Notified.

## 2014-10-12 ENCOUNTER — Ambulatory Visit: Payer: Medicare Other | Admitting: Pulmonary Disease

## 2014-10-14 ENCOUNTER — Institutional Professional Consult (permissible substitution): Payer: Medicare Other | Admitting: Cardiology

## 2014-10-19 ENCOUNTER — Telehealth: Payer: Self-pay | Admitting: *Deleted

## 2014-10-19 LAB — PROTIME-INR

## 2014-10-19 NOTE — Telephone Encounter (Signed)
Left message on patient's voice mail, continue current dose of Coumadin 4mg , recheck INR in one month. Fax back to [place] at [facility]

## 2014-10-19 NOTE — Telephone Encounter (Signed)
Received Protime Results from [facility] 10/19/2014  INR: 2.7  Current Dose of Coumadin: 4mg  once daily. Patient takes Bactrim DS routinely on MWF started on 08/05/14 through indefinetly. Given to Dani Gobble to review and sign.

## 2014-10-20 ENCOUNTER — Other Ambulatory Visit: Payer: Self-pay | Admitting: Nurse Practitioner

## 2014-10-23 ENCOUNTER — Ambulatory Visit: Payer: Medicare Other | Admitting: Pulmonary Disease

## 2014-10-26 NOTE — Telephone Encounter (Signed)
Spoke with Dr. Lucia Gaskins today who is her ENT.  He feels the vast majority of the inflammatory changes in her nose and subglottic area are more related to the sequelae of the patient's Wegner's rather than an ongoing vasculitis. We had discussed doing some nasal biopsies and possible subglottic biopsies to see whether we need to treat her more aggressively with immunosuppressive therapy. After seeing her on a monthly basis in order to help with debridement, he feels that she is doing well with conservative treatment. We will therefore hold off on more aggressive biopsy procedures, but would have a low threshold to do this if she has significant worsening clinically. Dr. Lucia Gaskins has discussed all of this with her rheumatologist as well.

## 2014-10-29 ENCOUNTER — Encounter: Payer: Self-pay | Admitting: Pulmonary Disease

## 2014-10-30 ENCOUNTER — Encounter: Payer: Self-pay | Admitting: Pulmonary Disease

## 2014-10-30 ENCOUNTER — Ambulatory Visit (INDEPENDENT_AMBULATORY_CARE_PROVIDER_SITE_OTHER): Payer: Medicare Other | Admitting: Pulmonary Disease

## 2014-10-30 VITALS — BP 122/72 | HR 85 | Temp 97.2°F | Ht 61.0 in | Wt 128.4 lb

## 2014-10-30 DIAGNOSIS — A319 Mycobacterial infection, unspecified: Secondary | ICD-10-CM

## 2014-10-30 DIAGNOSIS — J4520 Mild intermittent asthma, uncomplicated: Secondary | ICD-10-CM

## 2014-10-30 DIAGNOSIS — M3130 Wegener's granulomatosis without renal involvement: Secondary | ICD-10-CM

## 2014-10-30 NOTE — Patient Instructions (Signed)
Continue on your current pulmonary meds Stay active Keep up with your rinses, and apptm with your ENT doctor. followup with Dr. Lake Bells in 32mos.

## 2014-10-30 NOTE — Assessment & Plan Note (Signed)
The patient has a history of airflow obstruction that is felt to be secondary to an asthmatic component as well as bronchial stenosis. She continues on her inhaler regimen, and feels that she is very stable at this time. I have asked her to continue on this, and to work on some type of conditioning program. She is doing very well from an upper airway standpoint, with daily irrigations and close follow-up with otolaryngology for debridement.

## 2014-10-30 NOTE — Progress Notes (Signed)
   Subjective:    Patient ID: [name], female    DOB: 1933/06/16, 81 y.o.   MRN: [id number]  HPI A she comes in today for follow-up of her known Wegener's granulomatosis as well as airflow obstruction secondary to bronchial stenosis and a component of asthma. She has done very well from a breathing standpoint on her current regimen, and also has been keeping good control of her upper airway drainage and crusting with rinses and debridement by otolaryngology. She has had very little cough since last visit, and does not feel congested. He feels her breathing is at baseline.   Review of Systems  Constitutional: Negative for fever and unexpected weight change.  HENT: Positive for congestion and postnasal drip. Negative for dental problem, ear pain, nosebleeds, rhinorrhea, sinus pressure, sneezing, sore throat and trouble swallowing.   Eyes: Negative for redness and itching.  Respiratory: Positive for cough, shortness of breath and wheezing. Negative for chest tightness.   Cardiovascular: Negative for palpitations and leg swelling.  Gastrointestinal: Negative for nausea and vomiting.  Genitourinary: Negative for dysuria.  Musculoskeletal: Negative for joint swelling.  Skin: Negative for rash.  Neurological: Negative for headaches.  Hematological: Does not bruise/bleed easily.  Psychiatric/Behavioral: Negative for dysphoric mood. The patient is not nervous/anxious.        Objective:   Physical Exam Thin female in no acute distress Nose without purulence or discharge noted Neck without lymphadenopathy or thyromegaly Chest with large airway rhonchi, no definite wheezes. Good airflow. Cardiac exam with regular rate and rhythm Lower extremities without edema, no cyanosis. Alert and oriented, moves all 4 extremities.       Assessment & Plan:

## 2014-11-05 ENCOUNTER — Other Ambulatory Visit: Payer: Self-pay | Admitting: Pulmonary Disease

## 2014-11-06 ENCOUNTER — Encounter: Payer: Self-pay | Admitting: Nurse Practitioner

## 2014-11-10 ENCOUNTER — Institutional Professional Consult (permissible substitution): Payer: Medicare Other | Admitting: Cardiology

## 2014-11-17 ENCOUNTER — Ambulatory Visit (INDEPENDENT_AMBULATORY_CARE_PROVIDER_SITE_OTHER): Payer: Medicare Other

## 2014-11-17 ENCOUNTER — Ambulatory Visit (INDEPENDENT_AMBULATORY_CARE_PROVIDER_SITE_OTHER): Payer: Medicare Other | Admitting: Family Medicine

## 2014-11-17 VITALS — BP 130/90 | HR 76 | Temp 98.1°F | Resp 18 | Ht 61.0 in | Wt 125.2 lb

## 2014-11-17 DIAGNOSIS — D638 Anemia in other chronic diseases classified elsewhere: Secondary | ICD-10-CM | POA: Diagnosis not present

## 2014-11-17 DIAGNOSIS — M3130 Wegener's granulomatosis without renal involvement: Secondary | ICD-10-CM | POA: Diagnosis not present

## 2014-11-17 DIAGNOSIS — Z7901 Long term (current) use of anticoagulants: Secondary | ICD-10-CM | POA: Diagnosis not present

## 2014-11-17 DIAGNOSIS — R509 Fever, unspecified: Secondary | ICD-10-CM

## 2014-11-17 DIAGNOSIS — R042 Hemoptysis: Secondary | ICD-10-CM | POA: Diagnosis not present

## 2014-11-17 LAB — POCT URINALYSIS DIPSTICK
Bilirubin, UA: NEGATIVE
Blood, UA: NEGATIVE
Glucose, UA: NEGATIVE
Ketones, UA: NEGATIVE
Nitrite, UA: NEGATIVE
Spec Grav, UA: 1.015
Urobilinogen, UA: 0.2
pH, UA: 5.5

## 2014-11-17 LAB — POCT CBC
Granulocyte percent: 87.6 %G — AB (ref 37–80)
HCT, POC: 42.2 % (ref 37.7–47.9)
Hemoglobin: 13.6 g/dL (ref 12.2–16.2)
Lymph, poc: 0.7 (ref 0.6–3.4)
MCH, POC: 30.9 pg (ref 27–31.2)
MCHC: 32.2 g/dL (ref 31.8–35.4)
MCV: 95.7 fL (ref 80–97)
MID (cbc): 0.2 (ref 0–0.9)
MPV: 7.5 fL (ref 0–99.8)
POC Granulocyte: 6.6 (ref 2–6.9)
POC LYMPH PERCENT: 9.5 %L — AB (ref 10–50)
POC MID %: 2.9 %M (ref 0–12)
Platelet Count, POC: 279 10*3/uL (ref 142–424)
RBC: 4.41 M/uL (ref 4.04–5.48)
RDW, POC: 15.3 %
WBC: 7.5 10*3/uL (ref 4.6–10.2)

## 2014-11-17 LAB — POCT UA - MICROSCOPIC ONLY
Bacteria, U Microscopic: NEGATIVE
Casts, Ur, LPF, POC: NEGATIVE
Crystals, Ur, HPF, POC: NEGATIVE
Epithelial cells, urine per micros: NEGATIVE
Mucus, UA: NEGATIVE
RBC, urine, microscopic: NEGATIVE
Yeast, UA: NEGATIVE

## 2014-11-17 MED ORDER — AMOXICILLIN-POT CLAVULANATE 875-125 MG PO TABS: 1.0000 | ORAL_TABLET | Freq: Two times a day (BID) | ORAL | Status: DC

## 2014-11-17 MED ORDER — HYDROCODONE-HOMATROPINE 5-1.5 MG/5ML PO SYRP: 5.0000 mL | ORAL_SOLUTION | Freq: Three times a day (TID) | ORAL | Status: DC | PRN

## 2014-11-17 NOTE — Addendum Note (Signed)
Addended by: Robyn Haber on: 11/17/2014 08:58 PM   Modules accepted: Level of Service

## 2014-11-17 NOTE — Patient Instructions (Signed)
You need to see your personal doctor in the next 2 days. I'm starting you on antibiotics and I have some tests that are pending but you clearly have a bronchial infection that needs treatment with antibiotics. Please start your antibiotics tonight

## 2014-11-17 NOTE — Progress Notes (Addendum)
Subjective:    Patient ID: [name], female    DOB: Jun 22, 1933, 81 y.o.   MRN: [id number] This chart was scribed for Robyn Haber, MD by Marti Sleigh, Medical Scribe. This patient was seen in Room 12 and the patient's care was started a 4:03 PM.  Chief Complaint  Patient presents with   Fever    sunday temp was 103  and monday 102    HPI HPI Comments: [name] is a 81 y.o. female with a hx of multiple complex medical problems including Wegener's granulomatosis who presents to [facility] complaining of measured fever for the last two days, 103 two days ago, and 102 yesterday, with associated cough, generalized weakness, and blood in sputum. Pt states she has a recent history of hospitalization from January 18-28 with intubation, followed by three weeks of rehabilitative care at a nursing facility for pneumonia, with acute respiratory failure and hypoxia. Pt denies current abdominal pain. Pt's PCP is Dr. Harrington Challenger, and her geriatric specialist is Dr. Veleta Miners.  Pt has a past surgical hx of Vena Cava Umbrella  Pt reports a family hx of CVA (Father), CHF (Mother), Lymphoma (sister), alzheimer's (sister)  Review of Systems  Constitutional: Positive for fever. Negative for chills.  HENT: Positive for congestion. Negative for ear pain.   Respiratory: Positive for cough and shortness of breath.   Gastrointestinal: Negative for abdominal pain.       Objective:   Physical Exam  Constitutional: She is oriented to person, place, and time. She appears well-developed and well-nourished. No distress.  Deaf  HENT:  Head: Normocephalic and atraumatic.  Bilateral chronic otitis media  Eyes: Pupils are equal, round, and reactive to light.  Neck: Neck supple.  Cardiovascular: Normal rate.   Pulmonary/Chest: Effort normal. No respiratory distress.  Expiratory wheezes  Musculoskeletal: Normal range of motion.  Neurological: She is alert and oriented to person, place, and time. Coordination  normal.  Skin: Skin is warm and dry. She is not diaphoretic.  Psychiatric: She has a normal mood and affect. Her behavior is normal.  Nursing note and vitals reviewed.  [facility] reading (PRIMARY) by  Dr. Chinita Greenland x-ray shows diffuse nodularity and hyperinflation. Her x-ray is seen not significantly changed from previous and is consistent with bronchitis (chronic)  Results for orders placed or performed in visit on 11/17/14  POCT UA - Microscopic Only  Result Value Ref Range   WBC, Ur, HPF, POC 0-2    RBC, urine, microscopic neg    Bacteria, U Microscopic neg    Mucus, UA neg    Epithelial cells, urine per micros neg    Crystals, Ur, HPF, POC neg    Casts, Ur, LPF, POC neg    Yeast, UA neg   POCT urinalysis dipstick  Result Value Ref Range   Color, UA yellow    Clarity, UA clear    Glucose, UA neg    Bilirubin, UA neg    Ketones, UA neg    Spec Grav, UA 1.015    Blood, UA neg    pH, UA 5.5    Protein, UA trace    Urobilinogen, UA 0.2    Nitrite, UA neg    Leukocytes, UA Trace   POCT CBC  Result Value Ref Range   WBC 7.5 4.6 - 10.2 K/uL   Lymph, poc 0.7 0.6 - 3.4   POC LYMPH PERCENT 9.5 (A) 10 - 50 %L   MID (cbc) 0.2 0 - 0.9   POC  MID % 2.9 0 - 12 %M   POC Granulocyte 6.6 2 - 6.9   Granulocyte percent 87.6 (A) 37 - 80 %G   RBC 4.41 4.04 - 5.48 M/uL   Hemoglobin 13.6 12.2 - 16.2 g/dL   HCT, POC 42.2 37.7 - 47.9 %   MCV 95.7 80 - 97 fL   MCH, POC 30.9 27 - 31.2 pg   MCHC 32.2 31.8 - 35.4 g/dL   RDW, POC 15.3 %   Platelet Count, POC 279 142 - 424 K/uL   MPV 7.5 0 - 99.8 fL   patient was somewhat disruptive while here. We spent over 45 minutes trying to communicate because she's deaf and spent a good bit of time reviewing the records that she brought in Assessment & Plan:   This chart was scribed in my presence and reviewed by me personally.    ICD-9-CM ICD-10-CM   1. Hemoptysis 786.30 R04.2 DG Chest 2 View     DG Chest 2 View     Protime-INR  2. Wegener's  disease, pulmonary 446.4 M31.30 DG Chest 2 View     DG Chest 2 View  3. Fever, unspecified fever cause 780.60 R50.9 DG Chest 2 View     DG Chest 2 View     POCT UA - Microscopic Only     POCT urinalysis dipstick  4. Chronic anticoagulation V58.61 Z79.01 CBC     Protime-INR     CBC   Symptoms are consistent with chronic otitis media, weight nurse, acute bronchitis. She should do well with an antibiotic at this point. I've asked her to follow-up with her primary care doctor next 48 hours to make sure that she is improving.  Signed, Robyn Haber, MD

## 2014-11-18 ENCOUNTER — Telehealth: Payer: Self-pay | Admitting: *Deleted

## 2014-11-18 ENCOUNTER — Encounter: Payer: Self-pay | Admitting: Family Medicine

## 2014-11-18 ENCOUNTER — Ambulatory Visit: Payer: Medicare Other | Admitting: Internal Medicine

## 2014-11-18 LAB — PROTIME-INR
INR: 2.52 — ABNORMAL HIGH (ref <1.50)
Prothrombin Time: 27.2 seconds — ABNORMAL HIGH (ref 11.6–15.2)

## 2014-11-18 NOTE — Telephone Encounter (Signed)
Called order to patient and her husband , he wrote it down for her and I faxed it to the nurse office.

## 2014-11-19 ENCOUNTER — Encounter: Payer: Self-pay | Admitting: Nurse Practitioner

## 2014-11-19 ENCOUNTER — Ambulatory Visit (INDEPENDENT_AMBULATORY_CARE_PROVIDER_SITE_OTHER): Payer: Medicare Other | Admitting: Nurse Practitioner

## 2014-11-19 VITALS — BP 116/70 | HR 77 | Temp 97.7°F | Resp 18 | Ht 61.0 in | Wt 127.2 lb

## 2014-11-19 DIAGNOSIS — R791 Abnormal coagulation profile: Secondary | ICD-10-CM

## 2014-11-19 DIAGNOSIS — Z86711 Personal history of pulmonary embolism: Secondary | ICD-10-CM

## 2014-11-19 DIAGNOSIS — J209 Acute bronchitis, unspecified: Secondary | ICD-10-CM

## 2014-11-19 DIAGNOSIS — R042 Hemoptysis: Secondary | ICD-10-CM | POA: Diagnosis not present

## 2014-11-19 LAB — POCT INR: INR: 4.6

## 2014-11-19 MED ORDER — PHYTONADIONE 5 MG PO TABS
5.0000 mg | ORAL_TABLET | Freq: Once | ORAL | Status: DC
Start: 2014-11-19 — End: 2015-01-21

## 2014-11-19 NOTE — Patient Instructions (Addendum)
To Take VIT K 5 mg PO TODAY HOLD COUMADIN TODAY AND TOMORROW  [facility]-- staff to call on-call service with results   Follow up at [facility] next week on Thursday with Dr Nyoka Cowden

## 2014-11-19 NOTE — Progress Notes (Signed)
Patient ID: [name], female   DOB: 04/23/1933, 81 y.o.   MRN: [id number]    PCP: Estill Dooms, MD  Allergies  Allergen Reactions  . Adhesive [Tape] Rash    Chief Complaint  Patient presents with  . Follow-up    Fever 99.9 today, was 100.0 and above x 3 days. Cough (bronchitis)   . Bleeding/Bruising    Gums bleeding and mouth with funny taste x couple days   . Hearing Loss    Ongoing hearing loss     HPI: Patient is a 81 y.o. female seen in the office today due to fevers and chills. Pt went to urgent care on 11/17/14 and was diagnosed  with bronchitis. Chest xray obtained, Prescribed augmentin and cough syrup. Fever was 103 5 days ago prior to going to Urgent care. Work up this morning with a temp of 99.0, took tylenol this morning. Now without fever and feels fine.  No shortness of breath, sputum remains unchanged- some blood tinged. INR 3.4 yesterday, did not notify our office that she was on antibiotic   Bleeding at the gums while brushing her teeth- ongoing for 3 days. No bleeding now   Review of Systems:  Review of Systems  Constitutional: Positive for fever. Negative for chills, activity change, appetite change, fatigue and unexpected weight change.  HENT: Positive for dental problem (reports bleeding gums with oral care for 3 days). Negative for congestion and hearing loss.   Eyes: Negative.   Respiratory: Positive for cough. Negative for shortness of breath and wheezing.   Cardiovascular: Negative for chest pain, palpitations and leg swelling.  Gastrointestinal: Negative for abdominal pain, diarrhea and constipation.  Genitourinary: Negative for dysuria and difficulty urinating.  Musculoskeletal: Negative for myalgias and arthralgias.  Skin: Negative for color change and wound.  Neurological: Negative for dizziness and weakness.  Hematological: Bruises/bleeds easily.    Past Medical History  Diagnosis Date  . Wegener's granulomatosis   . OA (osteoarthritis)    . Blindness of one eye   . Hearing loss in left ear     hearing aid both ears  . Bladder incontinence   . Glaucoma   . Clotting disorder   . History of pulmonary embolism 1997  . Shortness of breath dyspnea   . Pneumonia 12/15  . Multiple allergies   . Chronic sinusitis   . Acute respiratory failure with hypoxia   . Interstitial emphysema   . Aspergillosis    Past Surgical History  Procedure Laterality Date  . Tubal ligation    . Pubovaginal sling    . Cataract extraction    . Melanoma excision      left leg  . Vena cava filter placement  1997    [place] filter  . Knee arthroscopy  2006    rt   . Inguinal hernia repair Left 02/17/2014    Procedure: LEFT INGUINAL HERNIA REPAIR WITH MESH;  Surgeon: Harl Bowie, MD;  Location: [facility];  Service: General;  Laterality: Left;  . Insertion of mesh N/A 02/17/2014    Procedure: INSERTION OF MESH;  Surgeon: Harl Bowie, MD;  Location: [facility];  Service: General;  Laterality: N/A;   Social History:   reports that she quit smoking about 30 years ago. Her smoking use included Cigarettes. She has a 45 pack-year smoking history. She has never used smokeless tobacco. She reports that she drinks alcohol. She reports that she does not use illicit drugs.  Family History  Problem Relation Age of Onset  . Heart disease Father     stroke-deceased  . Stroke Father   . Congestive Heart Failure Mother     deceased - CHF  . Heart disease Mother   . Cancer Sister   . Dementia Sister   . Cancer Sister     skin    Medications: Patient's Medications  New Prescriptions   No medications on file  Previous Medications   ACETAMINOPHEN (TYLENOL) 325 MG TABLET    Take 2 tablets (650 mg total) by mouth every 6 (six) hours as needed for mild pain (or Fever >/= 101).   ADVAIR DISKUS 500-50 MCG/DOSE AEPB    INHALE 1 PUFF EVERY 12 HOURS.   AMOXICILLIN-CLAVULANATE (AUGMENTIN) 875-125 MG PER TABLET     Take 1 tablet by mouth 2 (two) times daily.   B COMPLEX VITAMINS CAPSULE    Take 1 capsule by mouth daily.   CALCIUM-VITAMIN D (OSCAL WITH D) 500-200 MG-UNIT PER TABLET    Take 1 tablet by mouth daily with breakfast.   CHOLECALCIFEROL (VITAMIN D) 1000 UNITS TABLET    One daily for vitamin D supplement   FLUTICASONE (FLONASE) 50 MCG/ACT NASAL SPRAY    Place 1 spray into both nostrils daily.    GUAIFENESIN (MUCINEX) 600 MG 12 HR TABLET    Take 600 mg by mouth 2 (two) times daily.   HYDROCODONE-HOMATROPINE (HYCODAN) 5-1.5 MG/5ML SYRUP    Take 5 mLs by mouth every 8 (eight) hours as needed for cough.   LATANOPROST (XALATAN) 0.005 % OPHTHALMIC SOLUTION    Place 1 drop into both eyes at bedtime.   LEVALBUTEROL (XOPENEX) 0.63 MG/3ML NEBULIZER SOLUTION    Take 3 mLs by nebulization every 6 (six) hours as needed. Wheezing, shortness of breath   LORAZEPAM (ATIVAN) 0.5 MG TABLET    Take 1 tablet (0.5 mg total) by mouth every 6 (six) hours as needed for anxiety.   MULTIPLE VITAMINS-MINERALS (MULTIVITAMIN WITH MINERALS) TABLET    Take 1 tablet by mouth daily.   PANTOPRAZOLE (PROTONIX) 40 MG TABLET    Take 1 tablet (40 mg total) by mouth 2 (two) times daily.   SACCHAROMYCES BOULARDII (FLORASTOR) 250 MG CAPSULE    Take 250 mg by mouth 2 (two) times daily.   SODIUM CHLORIDE (OCEAN) 0.65 % SOLN NASAL SPRAY    Place 1 spray into both nostrils. One spray both nostrils as directed   SULFAMETHOXAZOLE-TRIMETHOPRIM (BACTRIM DS,SEPTRA DS) 800-160 MG PER TABLET    Take 1 tablet by mouth every Monday, Wednesday, and Friday. HOLD WHILE ON AUGMENTIN, THEN RESUME AS PREVIOUS   TIMOLOL (BETIMOL) 0.5 % OPHTHALMIC SOLUTION    Place 1 drop into the left eye daily with breakfast.    WARFARIN (COUMADIN) 4 MG TABLET    Take one tablet by mouth once daily for anticoagulation  Modified Medications   No medications on file  Discontinued Medications   FOLIC ACID (FOLVITE) 1 MG TABLET    Take 1 mg by mouth daily.   PHENOL  (CHLORASEPTIC) 1.4 % LIQD    Use as directed 1 spray in the mouth or throat as needed for throat irritation / pain.   POLYETHYLENE GLYCOL (MIRALAX / GLYCOLAX) PACKET    Take 17 g by mouth daily.   PREDNISONE (DELTASONE) 5 MG TABLET    Take 5 mg by mouth daily with breakfast.     Physical Exam:  Filed Vitals:   11/19/14 0904  BP: 116/70  Pulse: 77  Temp: 97.7 F (36.5 C)  TempSrc: Oral  Resp: 18  Height: 5\' 1"  (1.549 m)  Weight: 127 lb 3.2 oz (57.698 kg)  SpO2: 97%    Physical Exam  Constitutional: She is oriented to person, place, and time. She appears well-developed and well-nourished. No distress.  HENT:  Head: Normocephalic and atraumatic.  Mouth/Throat: Oropharynx is clear and moist. No oropharyngeal exudate.  No bleeding to gumline noted during exam  Eyes: Conjunctivae are normal. Pupils are equal, round, and reactive to light.  Neck: Normal range of motion. Neck supple.  Cardiovascular: Normal rate, regular rhythm and normal heart sounds.   Pulmonary/Chest: Effort normal and breath sounds normal.  Sputum observed with traces of dark blood.   Abdominal: Soft. Bowel sounds are normal.  Musculoskeletal: She exhibits no edema or tenderness.  Neurological: She is alert and oriented to person, place, and time.  Skin: Skin is warm and dry. She is not diaphoretic.  Psychiatric: She has a normal mood and affect.    Labs reviewed: Basic Metabolic Panel:  Recent Labs  07/20/14 1713  07/28/14 0545  08/14/14 2105 08/15/14 0537 08/16/14 0341  NA  --   < > 141  < > 137 139 140  K  --   < > 3.6  < > 4.3 3.5 3.7  CL  --   < > 108  --  101 103 105  CO2  --   < > 28  --   --  28 25  GLUCOSE  --   < > 97  --  263* 95 88  BUN  --   < > 23  < > 36* 30* 27*  CREATININE  --   < > 0.63  < > 1.40* 1.14* 0.94  CALCIUM  --   < > 8.3*  --   --  8.9 9.6  MG 2.0  --  2.1  --   --   --   --   < > = values in this interval not displayed. Liver Function Tests:  Recent Labs   07/23/14 0400 07/26/14 0530 07/28/14 0545 08/01/14  AST 36 36 23 22  ALT 23 43* 39* 31  ALKPHOS 97 90 82 90  BILITOT 0.4 0.5 0.6  --   PROT 5.0* 5.0* 4.6*  --   ALBUMIN 2.3* 2.3* 2.2*  --    No results for input(s): LIPASE, AMYLASE in the last 8760 hours. No results for input(s): AMMONIA in the last 8760 hours. CBC:  Recent Labs  07/24/14 0420  08/10/14 1539  08/15/14 0537 08/16/14 0341 09/22/14 1049 11/17/14 1840  WBC 8.8  < > 10.0  < > 9.6 6.5 6.0 7.5  NEUTROABS 8.1*  --  9.5*  --   --   --  4.2  --   HGB 8.1*  < > 11.3*  < > 11.1* 10.9* 12.5 13.6  HCT 26.8*  < > 35.7  < > 35.1* 34.9* 39.1 42.2  MCV 80.0  < > 86  < > 87.1 88.1 97 95.7  PLT 421*  < > 232  < > 264 244 242  --   < > = values in this interval not displayed. Lipid Panel: No results for input(s): CHOL, HDL, LDLCALC, TRIG, CHOLHDL, LDLDIRECT in the last 8760 hours. TSH: No results for input(s): TSH in the last 8760 hours. A1C: Lab Results  Component Value Date   HGBA1C 5.8* 07/28/2014   Lab Results  Component Value Date  INR 4.6 11/19/2014   INR 2.52* 11/17/2014   INR 2.5* 10/05/2014     Assessment/Plan 1. Blood in sputum - pt with acute bronchitis and elevated INR  - CBC with Differential to follow up hgb - phytonadione (VITAMIN K) 5 MG tablet; Take 1 tablet (5 mg total) by mouth once.  Dispense: 1 tablet; Refill: 0  2. History of pulmonary embolus (PE) -currently on coumadin 4 mg daily, INR yesterday at 3.4 and it was not known pt was on augmentin, pt with INR of 4.6 today - POC INR  3. Elevated INR -due to elevation in INR and bleeding gum as well as blood in sputum will have pt take Vit K 5 mg today -to HOLD coumadin until further orders  -will have [facility] staff check INR in 2 days - phytonadione (VITAMIN K) 5 MG tablet; Take 1 tablet (5 mg total) by mouth once.  Dispense: 1 tablet; Refill: 0  4. Bronchitis -will cont augmentin as prescribed at urgent care at this time. No  worsening shortness of breath or congestion. Feeling better today.  -conts to be on mucinex twice daily  Pt to follow up with Dr Nyoka Cowden in 1 week -return precautions discussed and [facility] nurse informed of follow up

## 2014-11-23 ENCOUNTER — Ambulatory Visit (HOSPITAL_BASED_OUTPATIENT_CLINIC_OR_DEPARTMENT_OTHER): Payer: Medicare Other | Admitting: Family

## 2014-11-23 ENCOUNTER — Other Ambulatory Visit (HOSPITAL_BASED_OUTPATIENT_CLINIC_OR_DEPARTMENT_OTHER): Payer: Medicare Other

## 2014-11-23 ENCOUNTER — Encounter: Payer: Self-pay | Admitting: Family

## 2014-11-23 ENCOUNTER — Encounter: Payer: Self-pay | Admitting: *Deleted

## 2014-11-23 VITALS — BP 106/82 | HR 89 | Temp 98.1°F | Wt 125.0 lb

## 2014-11-23 DIAGNOSIS — M3130 Wegener's granulomatosis without renal involvement: Secondary | ICD-10-CM

## 2014-11-23 DIAGNOSIS — D638 Anemia in other chronic diseases classified elsewhere: Secondary | ICD-10-CM

## 2014-11-23 DIAGNOSIS — D509 Iron deficiency anemia, unspecified: Secondary | ICD-10-CM

## 2014-11-23 LAB — CBC WITH DIFFERENTIAL (CANCER CENTER ONLY)
BASO#: 0 10*3/uL (ref 0.0–0.2)
BASO%: 0.4 % (ref 0.0–2.0)
EOS ABS: 0.1 10*3/uL (ref 0.0–0.5)
EOS%: 1.8 % (ref 0.0–7.0)
HCT: 38.5 % (ref 34.8–46.6)
HEMOGLOBIN: 12.8 g/dL (ref 11.6–15.9)
LYMPH#: 0.7 10*3/uL — AB (ref 0.9–3.3)
LYMPH%: 11.6 % — ABNORMAL LOW (ref 14.0–48.0)
MCH: 31.9 pg (ref 26.0–34.0)
MCHC: 33.2 g/dL (ref 32.0–36.0)
MCV: 96 fL (ref 81–101)
MONO#: 0.9 10*3/uL (ref 0.1–0.9)
MONO%: 16.6 % — ABNORMAL HIGH (ref 0.0–13.0)
NEUT#: 3.9 10*3/uL (ref 1.5–6.5)
NEUT%: 69.6 % (ref 39.6–80.0)
Platelets: 345 10*3/uL (ref 145–400)
RBC: 4.01 10*6/uL (ref 3.70–5.32)
RDW: 13.8 % (ref 11.1–15.7)
WBC: 5.6 10*3/uL (ref 3.9–10.0)

## 2014-11-23 LAB — RETICULOCYTES (CHCC)
ABS Retic: 51.6 10*3/uL (ref 19.0–186.0)
RBC.: 3.97 MIL/uL (ref 3.87–5.11)
Retic Ct Pct: 1.3 % (ref 0.4–2.3)

## 2014-11-23 LAB — IRON AND TIBC CHCC
%SAT: 11 % — ABNORMAL LOW (ref 21–57)
IRON: 26 ug/dL — AB (ref 41–142)
TIBC: 240 ug/dL (ref 236–444)
UIBC: 214 ug/dL (ref 120–384)

## 2014-11-23 LAB — CHCC SATELLITE - SMEAR

## 2014-11-23 LAB — FERRITIN CHCC: FERRITIN: 140 ng/mL (ref 9–269)

## 2014-11-23 NOTE — Progress Notes (Signed)
Hematology and Oncology Follow Up Visit  [name] [id number] 1933-05-30 81 y.o. 11/23/2014   Principle Diagnosis:  Recurrent iron deficiency anemia Wegener's granulomatosis  Current Therapy:   IV iron as indicated    Interim History:  [name] is here for a follow-up. She is doing well but still having some fatigue.  Her Hgb today is 12.8. Her INR on 5/19 was 4.6. We will have her hold her Coumadin for 2 days and have them recheck her INR at [facility] on Wednesday.  She is currently on antibiotics for bronchitis. These could certainly be interacting with her Coumadin and causing her INR to increase.  She had to change to a softer toothbrush because she was having some bleeding around her gums. This has resolved.  She denies fever, chills, n/v, chest pain, palpitations, abdominal pain, constipation, diarrhea, blood in urine or stool. She has a chronic cough from the wegener's granulomatosis. She denies SOB.  No tenderness numbness or tingling in her extremities. No new aches or pains.  Her appetite is good and she is staying hydrated. Her wight is stable.   Medications:    Medication List       This list is accurate as of: 11/23/14 11:50 AM.  Always use your most recent med list.               acetaminophen 325 MG tablet  Commonly known as:  TYLENOL  Take 2 tablets (650 mg total) by mouth every 6 (six) hours as needed for mild pain (or Fever >/= 101).     ADVAIR DISKUS 500-50 MCG/DOSE Aepb  Generic drug:  Fluticasone-Salmeterol  INHALE 1 PUFF EVERY 12 HOURS.     amoxicillin-clavulanate 875-125 MG per tablet  Commonly known as:  AUGMENTIN  Take 1 tablet by mouth 2 (two) times daily.     b complex vitamins capsule  Take 1 capsule by mouth daily.     calcium-vitamin D 500-200 MG-UNIT per tablet  Commonly known as:  OSCAL WITH D  Take 1 tablet by mouth daily with breakfast.     cholecalciferol 1000 UNITS tablet  Commonly known as:  VITAMIN D  One daily for  vitamin D supplement     fluticasone 50 MCG/ACT nasal spray  Commonly known as:  FLONASE  Place 1 spray into both nostrils daily.     guaiFENesin 600 MG 12 hr tablet  Commonly known as:  MUCINEX  Take 600 mg by mouth 2 (two) times daily.     HYDROcodone-homatropine 5-1.5 MG/5ML syrup  Commonly known as:  HYCODAN  Take 5 mLs by mouth every 8 (eight) hours as needed for cough.     latanoprost 0.005 % ophthalmic solution  Commonly known as:  XALATAN  Place 1 drop into both eyes at bedtime.     levalbuterol 0.63 MG/3ML nebulizer solution  Commonly known as:  XOPENEX  Take 3 mLs by nebulization every 6 (six) hours as needed. Wheezing, shortness of breath     LORazepam 0.5 MG tablet  Commonly known as:  ATIVAN  Take 1 tablet (0.5 mg total) by mouth every 6 (six) hours as needed for anxiety.     multivitamin with minerals tablet  Take 1 tablet by mouth daily.     pantoprazole 40 MG tablet  Commonly known as:  PROTONIX  Take 1 tablet (40 mg total) by mouth 2 (two) times daily.     phytonadione 5 MG tablet  Commonly known as:  VITAMIN K  Take 1 tablet (5 mg total) by mouth once.     saccharomyces boulardii 250 MG capsule  Commonly known as:  FLORASTOR  Take 250 mg by mouth 2 (two) times daily.     sodium chloride 0.65 % Soln nasal spray  Commonly known as:  OCEAN  Place 1 spray into both nostrils. One spray both nostrils as directed     sulfamethoxazole-trimethoprim 800-160 MG per tablet  Commonly known as:  BACTRIM DS,SEPTRA DS  Take 1 tablet by mouth every Monday, Wednesday, and Friday. HOLD WHILE ON AUGMENTIN, THEN RESUME AS PREVIOUS     timolol 0.5 % ophthalmic solution  Commonly known as:  BETIMOL  Place 1 drop into the left eye daily with breakfast.     warfarin 4 MG tablet  Commonly known as:  COUMADIN  Take one tablet by mouth once daily for anticoagulation        Allergies:  Allergies  Allergen Reactions  . Adhesive [Tape] Rash    Past Medical  History, Surgical history, Social history, and Family History were reviewed and updated.  Review of Systems: All other 10 point review of systems is negative.   Physical Exam:  weight is 125 lb (56.7 kg). Her oral temperature is 98.1 F (36.7 C). Her blood pressure is 106/82 and her pulse is 89.   Wt Readings from Last 3 Encounters:  11/23/14 125 lb (56.7 kg)  11/19/14 127 lb 3.2 oz (57.698 kg)  11/17/14 125 lb 3.2 oz (56.79 kg)    Ocular: Sclerae unicteric, pupils equal, round and reactive to light Ear-nose-throat: Oropharynx clear, dentition fair Lymphatic: No cervical or supraclavicular adenopathy Lungs no rales or rhonchi, good excursion bilaterally Heart regular rate and rhythm, no murmur appreciated Abd soft, nontender, positive bowel sounds MSK no focal spinal tenderness, no joint edema Neuro: non-focal, well-oriented, appropriate affect Breasts: Deferred  Lab Results  Component Value Date   WBC 5.6 11/23/2014   HGB 12.8 11/23/2014   HCT 38.5 11/23/2014   MCV 96 11/23/2014   PLT 345 11/23/2014   Lab Results  Component Value Date   FERRITIN 184 09/22/2014   IRON 100 09/22/2014   TIBC 266 09/22/2014   UIBC 167 09/22/2014   IRONPCTSAT 37 09/22/2014   Lab Results  Component Value Date   RETICCTPCT 1.5 09/22/2014   RBC 4.01 11/23/2014   RETICCTABS 60.8 09/22/2014   No results found for: KPAFRELGTCHN, LAMBDASER, KAPLAMBRATIO No results found for: IGGSERUM, IGA, IGMSERUM No results found for: Odetta Pink, SPEI   Chemistry      Component Value Date/Time   NA 140 08/16/2014 0341   NA 144 08/01/2014   K 3.7 08/16/2014 0341   CL 105 08/16/2014 0341   CO2 25 08/16/2014 0341   BUN 27* 08/16/2014 0341   BUN 13 08/01/2014   CREATININE 0.94 08/16/2014 0341   CREATININE 0.6 08/01/2014   GLU 91 08/01/2014      Component Value Date/Time   CALCIUM 9.6 08/16/2014 0341   ALKPHOS 90 08/01/2014   AST 22  08/01/2014   ALT 31 08/01/2014   BILITOT 0.6 07/28/2014 0545     Impression and Plan: [name] is 81 year old female with iron deficiency anemia and a history of wegener's granulomatosis. She is doing well but has some fatigue. She is taking antibiotics for bronchitis and her INR is elevated at 4.6.  We will have her hold her Coumadin today and tomorrow and then have [facility] recheck her  INR in Wednesday.  I sent a letter with these instructions home with her. We also faxed a copy to [facility] in [place].  Her blood counts today look good. We will see what her iron studies show. If she needs Feraheme we will bring her in later this week.  We will plan to see her back in 2 months for labs and follow-up.  She knows to call with any questions or concerns. We can certainly see her sooner if need be.   Eliezer Bottom, NP 5/23/201611:50 AM

## 2014-11-24 ENCOUNTER — Telehealth: Payer: Self-pay | Admitting: *Deleted

## 2014-11-24 NOTE — Telephone Encounter (Signed)
Received fax from [facility] stating Patient had her Protime done at Dr. Antonieta Pert office and they sent a note back stating that the patient's INR was high and to have patient stop Coumadin on Monday and Tuesday and repeat an INR on Wednesday. Given to Dr. Nyoka Cowden to review and sign off.

## 2014-11-25 ENCOUNTER — Telehealth: Payer: Self-pay | Admitting: *Deleted

## 2014-11-25 ENCOUNTER — Telehealth: Payer: Self-pay | Admitting: Nurse Practitioner

## 2014-11-25 NOTE — Telephone Encounter (Signed)
Per Dr. Kathreen Cosier 2.5mg  daily and recheck in 1 week. Patient Notified and agreed. Faxed to [facility]

## 2014-11-25 NOTE — Telephone Encounter (Signed)
New Message  We are sending this correspondence to inform your office that we have attempted to contact the patient to schedule per referral for Chronic sinusitis, unspecified location. Pt had an appointment scheduled with Dr. Meda Coffee on 11/10/2014. Pt called in and canceled appointment on 11/09/2014 and left the comment, (Patient) has company coming in and will call back to r/s ). We were unsuccessful in our attempts to reschedule and wanted you to be aware of our efforts.   Thank you!  [facility]  Patient care coordinator

## 2014-11-25 NOTE — Telephone Encounter (Signed)
Received protime Results from [facility] 11/25/2014  INR:  1.4  Current dose of Coumadin: 3mg  daily. HOLD 11/23/14 and 11/24/14. Previous INR was 4.6 at Dr. Antonieta Pert office.  Given to Dr. Nyoka Cowden to review.

## 2014-11-26 ENCOUNTER — Non-Acute Institutional Stay: Payer: Medicare Other | Admitting: Internal Medicine

## 2014-11-26 ENCOUNTER — Encounter: Payer: Self-pay | Admitting: Internal Medicine

## 2014-11-26 VITALS — BP 142/80 | HR 76 | Temp 97.4°F | Wt 125.0 lb

## 2014-11-26 DIAGNOSIS — S91001D Unspecified open wound, right ankle, subsequent encounter: Secondary | ICD-10-CM | POA: Diagnosis not present

## 2014-11-26 DIAGNOSIS — M3130 Wegener's granulomatosis without renal involvement: Secondary | ICD-10-CM | POA: Diagnosis not present

## 2014-11-26 DIAGNOSIS — S81001D Unspecified open wound, right knee, subsequent encounter: Secondary | ICD-10-CM | POA: Diagnosis not present

## 2014-11-26 DIAGNOSIS — D638 Anemia in other chronic diseases classified elsewhere: Secondary | ICD-10-CM

## 2014-11-26 DIAGNOSIS — S81009A Unspecified open wound, unspecified knee, initial encounter: Secondary | ICD-10-CM | POA: Insufficient documentation

## 2014-11-26 DIAGNOSIS — J209 Acute bronchitis, unspecified: Secondary | ICD-10-CM | POA: Diagnosis not present

## 2014-11-26 DIAGNOSIS — S81809A Unspecified open wound, unspecified lower leg, initial encounter: Secondary | ICD-10-CM

## 2014-11-26 DIAGNOSIS — S81801D Unspecified open wound, right lower leg, subsequent encounter: Secondary | ICD-10-CM | POA: Diagnosis not present

## 2014-11-26 DIAGNOSIS — R531 Weakness: Secondary | ICD-10-CM | POA: Diagnosis not present

## 2014-11-26 DIAGNOSIS — S91009A Unspecified open wound, unspecified ankle, initial encounter: Secondary | ICD-10-CM

## 2014-11-26 NOTE — Progress Notes (Signed)
Patient ID: [name], female   DOB: 19-Jul-1932, 81 y.o.   MRN: [id number]    [facility]     [facility] of Service[facility] (12[facility]     Allergies  Allergen Reactions  . Adhesive [Tape] Rash    Chief Complaint  Patient presents with  . Medical Management of Chronic Issues    one week follow-up on bronchitis, cough some better. No blood in sputum    HPI:  Acute bronchitis, unspecified organism: Diagnosed on 11/17/14 at Urgent Care, prescribed Augmentin. Tolerating well, loose stools, but no diarrhea or abdominal discomfort. Reports fevers of 99 lasted several days after diagnosis and is concerned about this. Has had oral temperatures of 97 for last 2 days. Productive cough, clear sputum remains, but is improving. Continues to take Mucinex daily.  Weak: Ongoing but worse over last 2 weeks, report weakness/difficulty rising from chair. Upper arms are 'sore' and weak when pushing up from chair. Able to ambulate easily and steadily when up, carrying/not using cane upon entry to exam room.  Anemia of chronic disease: Hgb stable at 12.8 on 11/23/14, Iron 26  Open wound of knee, leg (except thigh), and ankle, complicated, right, subsequent encounter: Dime sized scab to lateral aspect of right ankle, states is 2-3 months old and has been slow to heal. Is tender. Her husband was pushing her around in a wheelchair and bumped her into something, injuring her ankle.    Medications: Patient's Medications  New Prescriptions   No medications on file  Previous Medications   ACETAMINOPHEN (TYLENOL) 325 MG TABLET    Take 2 tablets (650 mg total) by mouth every 6 (six) hours as needed for mild pain (or Fever >/= 101).   ADVAIR DISKUS 500-50 MCG/DOSE AEPB    INHALE 1 PUFF EVERY 12 HOURS.   AMOXICILLIN-CLAVULANATE (AUGMENTIN) 875-125 MG PER TABLET    Take 1 tablet by mouth 2 (two) times daily.   B COMPLEX VITAMINS CAPSULE    Take 1 capsule by mouth daily.   CALCIUM-VITAMIN D (OSCAL WITH  D) 500-200 MG-UNIT PER TABLET    Take 1 tablet by mouth daily with breakfast.   CHOLECALCIFEROL (VITAMIN D) 1000 UNITS TABLET    One daily for vitamin D supplement   FLUTICASONE (FLONASE) 50 MCG/ACT NASAL SPRAY    Place 1 spray into both nostrils daily.    GUAIFENESIN (MUCINEX) 600 MG 12 HR TABLET    Take 600 mg by mouth 2 (two) times daily.   HYDROCODONE-HOMATROPINE (HYCODAN) 5-1.5 MG/5ML SYRUP    Take 5 mLs by mouth every 8 (eight) hours as needed for cough.   LATANOPROST (XALATAN) 0.005 % OPHTHALMIC SOLUTION    Place 1 drop into both eyes at bedtime.   LEVALBUTEROL (XOPENEX) 0.63 MG/3ML NEBULIZER SOLUTION    Take 3 mLs by nebulization every 6 (six) hours as needed. Wheezing, shortness of breath   LORAZEPAM (ATIVAN) 0.5 MG TABLET    Take 1 tablet (0.5 mg total) by mouth every 6 (six) hours as needed for anxiety.   MULTIPLE VITAMINS-MINERALS (MULTIVITAMIN WITH MINERALS) TABLET    Take 1 tablet by mouth daily.   PANTOPRAZOLE (PROTONIX) 40 MG TABLET    Take 1 tablet (40 mg total) by mouth 2 (two) times daily.   PHYTONADIONE (VITAMIN K) 5 MG TABLET    Take 1 tablet (5 mg total) by mouth once.   SACCHAROMYCES BOULARDII (FLORASTOR) 250 MG CAPSULE    Take 250 mg by mouth 2 (two) times daily.  SODIUM CHLORIDE (OCEAN) 0.65 % SOLN NASAL SPRAY    Place 1 spray into both nostrils. One spray both nostrils as directed   SULFAMETHOXAZOLE-TRIMETHOPRIM (BACTRIM DS,SEPTRA DS) 800-160 MG PER TABLET    Take 1 tablet by mouth every Monday, Wednesday, and Friday. HOLD WHILE ON AUGMENTIN, THEN RESUME AS PREVIOUS   TIMOLOL (BETIMOL) 0.5 % OPHTHALMIC SOLUTION    Place 1 drop into the left eye daily with breakfast.    WARFARIN (COUMADIN) 2.5 MG TABLET    Take 2.5 mg by mouth daily.  Modified Medications   No medications on file  Discontinued Medications   WARFARIN (COUMADIN) 4 MG TABLET    Take one tablet by mouth once daily for anticoagulation     Review of Systems  Constitutional: Positive for fatigue. Negative  for fever, chills, diaphoresis, activity change, appetite change and unexpected weight change.       Frail and generally weak.  HENT: Positive for hearing loss (Deaf on left, hearing aid on right ) and voice change (Hoarse). Negative for congestion, ear discharge, ear pain, postnasal drip, rhinorrhea, sore throat, tinnitus and trouble swallowing.   Eyes: Negative.  Negative for pain, redness, itching and visual disturbance.  Respiratory: Positive for cough, shortness of breath and wheezing (Intermittent). Negative for choking.        History Wegener's granulomatosis. History of chronic sinusitis related to her Wegener's.  Cardiovascular: Negative for chest pain, palpitations and leg swelling.  Gastrointestinal: Negative for nausea, abdominal pain, diarrhea, constipation and abdominal distention.       History of GERD.  Endocrine: Negative for cold intolerance, heat intolerance, polydipsia, polyphagia and polyuria.       History of hyperglycemia induced by steroid use.  Genitourinary: Positive for frequency. Negative for dysuria, urgency, hematuria, flank pain, vaginal discharge, difficulty urinating and pelvic pain.       Frequency induced by furosemide. Episodes of incontinence of urine.  Musculoskeletal: Negative for myalgias, back pain, arthralgias, gait problem, neck pain and neck stiffness.       Generalized weakness  Skin: Negative.  Negative for color change, pallor and rash.  Allergic/Immunologic: Negative.   Neurological: Negative for dizziness, tremors, seizures, syncope, weakness, numbness and headaches.  Hematological: Negative for adenopathy. Does not bruise/bleed easily.       Chronic anemia.  Psychiatric/Behavioral: Negative.  Negative for suicidal ideas, hallucinations, behavioral problems, confusion, sleep disturbance, dysphoric mood and agitation. The patient is not nervous/anxious and is not hyperactive.     Filed Vitals:   11/26/14 1103  BP: 142/80  Pulse: 76  Temp:  97.4 F (36.3 C)  TempSrc: Oral  Weight: 125 lb (56.7 kg)  SpO2: 93%   Body mass index is 23.63 kg/(m^2).  Physical Exam  Constitutional: She is oriented to person, place, and time. She appears well-developed. No distress.  Thin and frail. Weight stable at 125# since 3/16  HENT:  Head: Normocephalic and atraumatic.  Nose: Nose normal.  Mouth/Throat: Oropharynx is clear and moist. No oropharyngeal exudate.  Bilateral hearing loss. Hearing aid - right ear  Eyes: Conjunctivae and EOM are normal. Pupils are equal, round, and reactive to light. Right eye exhibits no discharge. Left eye exhibits no discharge. No scleral icterus.  Neck: Normal range of motion. Neck supple. No JVD present. No tracheal deviation present. No thyromegaly present.  Cardiovascular: Normal rate, regular rhythm, normal heart sounds and intact distal pulses.   No murmur heard. Pulmonary/Chest: Effort normal. No stridor. No respiratory distress. She has wheezes. She has  no rales. She exhibits no tenderness.  Voice hoarse  Abdominal: Soft. Bowel sounds are normal. She exhibits no distension and no mass. There is no tenderness. There is no rebound and no guarding.  Musculoskeletal: Normal range of motion. She exhibits edema (trace). She exhibits no tenderness.  trace  Lymphadenopathy:    She has no cervical adenopathy.  Neurological: She is alert and oriented to person, place, and time. She has normal reflexes. She displays normal reflexes. No cranial nerve deficit. She exhibits normal muscle tone. Coordination normal.  Skin: Skin is warm and dry. No rash noted. She is not diaphoretic. No erythema. No pallor.  Corns on the left foot at the tip of the third toe and at the fifth metacarpal phalangeal joint slightly lateral. Dime sized dry scab to right lateral ankle  Psychiatric: She has a normal mood and affect. Her behavior is normal. Judgment and thought content normal.     Labs reviewed: Appointment on 11/23/2014   Component Date Value Ref Range Status  . WBC 11/23/2014 5.6  3.9 - 10.0 10e3/uL Final  . RBC 11/23/2014 4.01  3.70 - 5.32 10e6/uL Final  . HGB 11/23/2014 12.8  11.6 - 15.9 g/dL Final  . HCT 11/23/2014 38.5  34.8 - 46.6 % Final  . MCV 11/23/2014 96  81 - 101 fL Final  . MCH 11/23/2014 31.9  26.0 - 34.0 pg Final  . MCHC 11/23/2014 33.2  32.0 - 36.0 g/dL Final  . RDW 11/23/2014 13.8  11.1 - 15.7 % Final  . Platelets 11/23/2014 345  145 - 400 10e3/uL Final  . NEUT# 11/23/2014 3.9  1.5 - 6.5 10e3/uL Final  . LYMPH# 11/23/2014 0.7* 0.9 - 3.3 10e3/uL Final  . MONO# 11/23/2014 0.9  0.1 - 0.9 10e3/uL Final  . Eosinophils Absolute 11/23/2014 0.1  0.0 - 0.5 10e3/uL Final  . BASO# 11/23/2014 0.0  0.0 - 0.2 10e3/uL Final  . NEUT% 11/23/2014 69.6  39.6 - 80.0 % Final  . LYMPH% 11/23/2014 11.6* 14.0 - 48.0 % Final  . MONO% 11/23/2014 16.6* 0.0 - 13.0 % Final  . EOS% 11/23/2014 1.8  0.0 - 7.0 % Final  . BASO% 11/23/2014 0.4  0.0 - 2.0 % Final  . Smear Result 11/23/2014 Smear Available   Final  . Retic Ct Pct 11/23/2014 1.3  0.4 - 2.3 % Final  . RBC. 11/23/2014 3.97  3.87 - 5.11 MIL/uL Final  . ABS Retic 11/23/2014 51.6  19.0 - 186.0 K/uL Final  . Ferritin 11/23/2014 140  9 - 269 ng/ml Final  . Iron 11/23/2014 26* 41 - 142 ug/dL Final  . TIBC 11/23/2014 240  236 - 444 ug/dL Final  . UIBC 11/23/2014 214  120 - 384 ug/dL Final  . %SAT 11/23/2014 11* 21 - 57 % Final  Office Visit on 11/19/2014  Component Date Value Ref Range Status  . INR 11/19/2014 4.6   Final   On ABX   Office Visit on 11/17/2014  Component Date Value Ref Range Status  . Prothrombin Time 11/17/2014 27.2* 11.6 - 15.2 seconds Final  . INR 11/17/2014 2.52* <1.50 Final   Comment: The INR is of principal utility in following patients on stable doses of oral anticoagulants.  The therapeutic range is generally 2.0 to 3.0, but may be 3.0 to 4.0 in patients with mechanical cardiac valves, recurrent embolisms and antiphospholipid  antibodies (including lupus inhibitors).   . WBC, Ur, HPF, POC 11/17/2014 0-2   Final  . RBC, urine, microscopic  11/17/2014 neg   Final  . Bacteria, U Microscopic 11/17/2014 neg   Final  . Mucus, UA 11/17/2014 neg   Final  . Epithelial cells, urine per micros 11/17/2014 neg   Final  . Crystals, Ur, HPF, POC 11/17/2014 neg   Final  . Casts, Ur, LPF, POC 11/17/2014 neg   Final  . Yeast, UA 11/17/2014 neg   Final  . Color, UA 11/17/2014 yellow   Final  . Clarity, UA 11/17/2014 clear   Final  . Glucose, UA 11/17/2014 neg   Final  . Bilirubin, UA 11/17/2014 neg   Final  . Ketones, UA 11/17/2014 neg   Final  . Spec Grav, UA 11/17/2014 1.015   Final  . Blood, UA 11/17/2014 neg   Final  . pH, UA 11/17/2014 5.5   Final  . Protein, UA 11/17/2014 trace   Final  . Urobilinogen, UA 11/17/2014 0.2   Final  . Nitrite, UA 11/17/2014 neg   Final  . Leukocytes, UA 11/17/2014 Trace   Final  . WBC 11/17/2014 7.5  4.6 - 10.2 K/uL Final  . Lymph, poc 11/17/2014 0.7  0.6 - 3.4 Final  . POC LYMPH PERCENT 11/17/2014 9.5* 10 - 50 %L Final  . MID (cbc) 11/17/2014 0.2  0 - 0.9 Final  . POC MID % 11/17/2014 2.9  0 - 12 %M Final  . POC Granulocyte 11/17/2014 6.6  2 - 6.9 Final  . Granulocyte percent 11/17/2014 87.6* 37 - 80 %G Final  . RBC 11/17/2014 4.41  4.04 - 5.48 M/uL Final  . Hemoglobin 11/17/2014 13.6  12.2 - 16.2 g/dL Final  . HCT, POC 11/17/2014 42.2  37.7 - 47.9 % Final  . MCV 11/17/2014 95.7  80 - 97 fL Final  . MCH, POC 11/17/2014 30.9  27 - 31.2 pg Final  . MCHC 11/17/2014 32.2  31.8 - 35.4 g/dL Final  . RDW, POC 11/17/2014 15.3   Final  . Platelet Count, POC 11/17/2014 279  142 - 424 K/uL Final  . MPV 11/17/2014 7.5  0 - 99.8 fL Final  Telephone on 10/05/2014  Component Date Value Ref Range Status  . INR 10/05/2014 2.5* 0.9 - 1.1 Final  Appointment on 09/22/2014  Component Date Value Ref Range Status  . WBC 09/22/2014 6.0  3.9 - 10.0 10e3/uL Final  . RBC 09/22/2014 4.02  3.70 - 5.32  10e6/uL Final  . HGB 09/22/2014 12.5  11.6 - 15.9 g/dL Final  . HCT 09/22/2014 39.1  34.8 - 46.6 % Final  . MCV 09/22/2014 97  81 - 101 fL Final  . MCH 09/22/2014 31.1  26.0 - 34.0 pg Final  . MCHC 09/22/2014 32.0  32.0 - 36.0 g/dL Final  . RDW 09/22/2014 22.8* 11.1 - 15.7 % Final  . Platelets 09/22/2014 242  145 - 400 10e3/uL Final  . NEUT# 09/22/2014 4.2  1.5 - 6.5 10e3/uL Final  . LYMPH# 09/22/2014 0.8* 0.9 - 3.3 10e3/uL Final  . MONO# 09/22/2014 0.8  0.1 - 0.9 10e3/uL Final  . Eosinophils Absolute 09/22/2014 0.1  0.0 - 0.5 10e3/uL Final  . BASO# 09/22/2014 0.0  0.0 - 0.2 10e3/uL Final  . NEUT% 09/22/2014 71.0  39.6 - 80.0 % Final  . LYMPH% 09/22/2014 13.9* 14.0 - 48.0 % Final  . MONO% 09/22/2014 12.8  0.0 - 13.0 % Final  . EOS% 09/22/2014 1.8  0.0 - 7.0 % Final  . BASO% 09/22/2014 0.5  0.0 - 2.0 % Final  . Smear Result  09/22/2014 Smear Available   Final  . Iron 09/22/2014 100  41 - 142 ug/dL Final  . TIBC 09/22/2014 266  236 - 444 ug/dL Final  . UIBC 09/22/2014 167  120 - 384 ug/dL Final  . %SAT 09/22/2014 37  21 - 57 % Final  . Ferritin 09/22/2014 184  9 - 269 ng/ml Final  . Retic Ct Pct 09/22/2014 1.5  0.4 - 2.3 % Final  . RBC. 09/22/2014 4.05  3.87 - 5.11 MIL/uL Final  . ABS Retic 09/22/2014 60.8  19.0 - 186.0 K/uL Final  Telephone on 09/22/2014  Component Date Value Ref Range Status  . INR 09/21/2014 2.8* 0.9 - 1.1 Final     Assessment/Plan 1. Acute bronchitis, unspecified organism Improving, complete course of Augmentin  2. Wegener's Granulomatosis Weaker. Referred to Dr Ang, Rheumatology at [facility] per son-in-law's request  3. Anemia of chronic disease Hgb stable, Hematology following as well for Wegener's  4. Open wound of knee, leg (except thigh), and ankle, complicated, right, subsequent encounter Place hydrocolloid bandages on wound, leave on until fall off, then replace

## 2014-12-01 ENCOUNTER — Other Ambulatory Visit: Payer: Self-pay | Admitting: Nurse Practitioner

## 2014-12-02 ENCOUNTER — Telehealth: Payer: Self-pay | Admitting: *Deleted

## 2014-12-02 LAB — POCT INR: INR: 1.4 — AB (ref 0.9–1.1)

## 2014-12-02 NOTE — Telephone Encounter (Signed)
Per Dr. Eugenia Mcalpine 3mg  of Coumadin daily and recheck in 1 week. Unable to notify patient. Called and spoke with [name] and she will make patient aware. Agreed.

## 2014-12-02 NOTE — Telephone Encounter (Signed)
Protime Results from [facility] 12/02/2014  INR:  1.4  Current Dose of Coumadin is 2.5mg  daily. Patient takes Bactrim DS routinely on MWF indefinitely for treatment of Wegener's Disease. Completed Augmentin on Sunday for treatment of Bronchitis. Given to Dr. Nyoka Cowden to review and sign.

## 2014-12-04 ENCOUNTER — Ambulatory Visit (HOSPITAL_BASED_OUTPATIENT_CLINIC_OR_DEPARTMENT_OTHER): Payer: Medicare Other

## 2014-12-04 VITALS — BP 127/49 | HR 80 | Temp 98.0°F | Resp 18

## 2014-12-04 DIAGNOSIS — D638 Anemia in other chronic diseases classified elsewhere: Secondary | ICD-10-CM

## 2014-12-04 DIAGNOSIS — D509 Iron deficiency anemia, unspecified: Secondary | ICD-10-CM | POA: Diagnosis not present

## 2014-12-04 DIAGNOSIS — M3130 Wegener's granulomatosis without renal involvement: Secondary | ICD-10-CM | POA: Diagnosis not present

## 2014-12-04 MED ORDER — SODIUM CHLORIDE 0.9 % IV SOLN
510.0000 mg | Freq: Once | INTRAVENOUS | Status: AC
  Administered 2014-12-04: 510 mg via INTRAVENOUS
  Filled 2014-12-04: qty 17

## 2014-12-04 NOTE — Patient Instructions (Signed)

## 2014-12-07 ENCOUNTER — Encounter: Payer: Self-pay | Admitting: Internal Medicine

## 2014-12-10 ENCOUNTER — Ambulatory Visit: Payer: Medicare Other | Admitting: Nurse Practitioner

## 2014-12-18 ENCOUNTER — Ambulatory Visit (INDEPENDENT_AMBULATORY_CARE_PROVIDER_SITE_OTHER): Payer: Medicare Other | Admitting: Podiatry

## 2014-12-18 ENCOUNTER — Encounter: Payer: Self-pay | Admitting: Podiatry

## 2014-12-18 VITALS — BP 63/42 | HR 69 | Resp 18

## 2014-12-18 DIAGNOSIS — L97522 Non-pressure chronic ulcer of other part of left foot with fat layer exposed: Secondary | ICD-10-CM | POA: Diagnosis not present

## 2014-12-19 NOTE — Progress Notes (Signed)
Subjective:     Patient ID: [name], female   DOB: 1933-04-02, 81 y.o.   MRN: [id number]  HPIThis patient returns to the office saying her fourth toe left foot is bleeding and painful.  She has previously been treated for ulcer fourth toe which has been difficult to heal due to her health and circulation.     Review of Systems     Objective:   Physical Exam Objective: Review of past medical history, medications, social history and allergies were performed.  Vascular: Dorsalis pedis and posterior tibial pulses are absent  B/L, capillary refill was  diminished B/L, temperature gradient was decreased.  Cold feet noted. B/L   Skin:  She has slit like ulceration on her fourth toe distal aspect due to positional deformity fourth toe.  She has hematoma and crust in the ulcer site in the absence if infection or drainage.  Nails: appear healthy with no signs of mycosis or infections  Sensory: Semmes Weinstein monifilament diminished   Orthopedic: Orthopedic evaluation demonstrates all joints distal t ankle have full ROM without crepitus, muscle power WNL B/L    Assessment:     Ulcer fourth toe left foot     Plan:      Ulcer fourth toe left foot  Neosporin /DSD  Home instructions given

## 2014-12-21 ENCOUNTER — Telehealth: Payer: Self-pay | Admitting: *Deleted

## 2014-12-21 NOTE — Telephone Encounter (Signed)
Protime Results from [facility] 12/21/2014  INR:  1.7  Current dose of Coumadin: 6mg  on 12/16/2014 then resumed 3mg  daily. Patient takes Bactrim DS routinely on MWF indefinitely for treatment of Wegener's disease Fax given to Dr. Mariea Clonts to review and sign.

## 2014-12-21 NOTE — Telephone Encounter (Signed)
Per Dr. Sharol Harness Coumadin to 3mg  Mon,Wed,Fri,Sat,Sun; 6mg  on Tuesday and Thursday and recheck in 1 week.   Called and [facility] for patient to return call. Called and spoke with Donnika at [facility] and she will give patient instructions. Faxed labs back to Golinda at [facility]

## 2014-12-22 ENCOUNTER — Encounter: Payer: Self-pay | Admitting: Internal Medicine

## 2014-12-22 ENCOUNTER — Ambulatory Visit (INDEPENDENT_AMBULATORY_CARE_PROVIDER_SITE_OTHER): Payer: Medicare Other | Admitting: Internal Medicine

## 2014-12-22 VITALS — BP 120/80 | HR 84 | Temp 98.0°F | Resp 20 | Ht 61.0 in | Wt 124.8 lb

## 2014-12-22 DIAGNOSIS — L97311 Non-pressure chronic ulcer of right ankle limited to breakdown of skin: Secondary | ICD-10-CM

## 2014-12-22 DIAGNOSIS — L97309 Non-pressure chronic ulcer of unspecified ankle with unspecified severity: Secondary | ICD-10-CM | POA: Insufficient documentation

## 2014-12-22 MED ORDER — SILVER SULFADIAZINE 1 % EX CREA: TOPICAL_CREAM | CUTANEOUS | Status: DC

## 2014-12-22 NOTE — Telephone Encounter (Signed)
Patient notified and understood directions. Patient repeated them back to me. Patient also has an appointment today with Dr. Nyoka Cowden.

## 2014-12-22 NOTE — Progress Notes (Signed)
Patient ID: [name], female   DOB: Dec 30, 1932, 81 y.o.   MRN: [id number]    Facility  [facility]    Place of Service:   OFFICE    Allergies  Allergen Reactions  . Adhesive [Tape] Rash    Chief Complaint  Patient presents with  . Acute Visit    right ankle wound, scab fell off and has been draining ever since.    HPI:  Ankle ulcer, right, limited to breakdown of skin: present for several weeks on right lateral malleoulus.    Medications: Patient's Medications  New Prescriptions   No medications on file  Previous Medications   ACETAMINOPHEN (TYLENOL) 325 MG TABLET    Take 2 tablets (650 mg total) by mouth every 6 (six) hours as needed for mild pain (or Fever >/= 101).   ADVAIR DISKUS 500-50 MCG/DOSE AEPB    INHALE 1 PUFF EVERY 12 HOURS.   B COMPLEX VITAMINS CAPSULE    Take 1 capsule by mouth daily.   CALCIUM-VITAMIN D (OSCAL WITH D) 500-200 MG-UNIT PER TABLET    Take 1 tablet by mouth daily with breakfast.   CHOLECALCIFEROL (VITAMIN D) 1000 UNITS TABLET    One daily for vitamin D supplement   FLUTICASONE (FLONASE) 50 MCG/ACT NASAL SPRAY    Place 1 spray into both nostrils daily.    GUAIFENESIN (MUCINEX) 600 MG 12 HR TABLET    Take 600 mg by mouth 2 (two) times daily.   LATANOPROST (XALATAN) 0.005 % OPHTHALMIC SOLUTION    Place 1 drop into both eyes at bedtime.   LORAZEPAM (ATIVAN) 0.5 MG TABLET    Take 1 tablet (0.5 mg total) by mouth every 6 (six) hours as needed for anxiety.   MULTIPLE VITAMINS-MINERALS (MULTIVITAMIN WITH MINERALS) TABLET    Take 1 tablet by mouth daily.   PANTOPRAZOLE (PROTONIX) 40 MG TABLET    TAKE 1 TABLET TWICE DAILY.   PHYTONADIONE (VITAMIN K) 5 MG TABLET    Take 1 tablet (5 mg total) by mouth once.   SACCHAROMYCES BOULARDII (FLORASTOR) 250 MG CAPSULE    Take 250 mg by mouth 2 (two) times daily.   SODIUM CHLORIDE (OCEAN) 0.65 % SOLN NASAL SPRAY    Place 1 spray into both nostrils. One spray both nostrils as directed   SULFAMETHOXAZOLE-TRIMETHOPRIM  (BACTRIM DS,SEPTRA DS) 800-160 MG PER TABLET    Take 1 tablet by mouth every Monday, Wednesday, and Friday. HOLD WHILE ON AUGMENTIN, THEN RESUME AS PREVIOUS   TIMOLOL (BETIMOL) 0.5 % OPHTHALMIC SOLUTION    Place 1 drop into the left eye daily with breakfast.    WARFARIN (COUMADIN) 2.5 MG TABLET    daily. See the 4 mg warfarin for explanation of medication dosage.   WARFARIN (COUMADIN) 4 MG TABLET    Increase Coumadin to 3mg  Mon,Wed,Fri,Sat,Sun; 6mg  on Tuesday and Thursday and recheck in 1 week.  Modified Medications   No medications on file  Discontinued Medications   AMOXICILLIN-CLAVULANATE (AUGMENTIN) 875-125 MG PER TABLET    Take 1 tablet by mouth 2 (two) times daily.   HYDROCODONE-HOMATROPINE (HYCODAN) 5-1.5 MG/5ML SYRUP    Take 5 mLs by mouth every 8 (eight) hours as needed for cough.   LEVALBUTEROL (XOPENEX) 0.63 MG/3ML NEBULIZER SOLUTION    Take 3 mLs by nebulization every 6 (six) hours as needed. Wheezing, shortness of breath     Review of Systems  Constitutional: Positive for fatigue. Negative for fever, chills, diaphoresis, activity change, appetite change and unexpected weight change.  Frail and generally weak.  HENT: Positive for hearing loss (Deaf on left, hearing aid on right ) and voice change (Hoarse). Negative for congestion, ear discharge, ear pain, postnasal drip, rhinorrhea, sore throat, tinnitus and trouble swallowing.   Eyes: Negative.  Negative for pain, redness, itching and visual disturbance.  Respiratory: Positive for cough, shortness of breath and wheezing (Intermittent). Negative for choking.        History Wegener's granulomatosis. History of chronic sinusitis related to her Wegener's.  Cardiovascular: Negative for chest pain, palpitations and leg swelling.  Gastrointestinal: Negative for nausea, abdominal pain, diarrhea, constipation and abdominal distention.       History of GERD.  Endocrine: Negative for cold intolerance, heat intolerance, polydipsia,  polyphagia and polyuria.       History of hyperglycemia induced by steroid use.  Genitourinary: Positive for frequency. Negative for dysuria, urgency, hematuria, flank pain, vaginal discharge, difficulty urinating and pelvic pain.       Frequency induced by furosemide. Episodes of incontinence of urine.  Musculoskeletal: Negative for myalgias, back pain, arthralgias, gait problem, neck pain and neck stiffness.       Generalized weakness  Skin: Negative.  Negative for color change, pallor and rash.  Allergic/Immunologic: Negative.   Neurological: Negative for dizziness, tremors, seizures, syncope, weakness, numbness and headaches.  Hematological: Negative for adenopathy. Does not bruise/bleed easily.       Chronic anemia.  Psychiatric/Behavioral: Negative.  Negative for suicidal ideas, hallucinations, behavioral problems, confusion, sleep disturbance, dysphoric mood and agitation. The patient is not nervous/anxious and is not hyperactive.     Filed Vitals:   12/22/14 1457  BP: 120/80  Pulse: 84  Temp: 98 F (36.7 C)  TempSrc: Oral  Resp: 20  Height: 5\' 1"  (1.549 m)  Weight: 124 lb 12.8 oz (56.609 kg)  SpO2: 95%   Body mass index is 23.59 kg/(m^2).  Physical Exam  Constitutional: She is oriented to person, place, and time. She appears well-developed. No distress.  Thin and frail. Weight stable at 125# since 3/16  HENT:  Head: Normocephalic and atraumatic.  Nose: Nose normal.  Mouth/Throat: Oropharynx is clear and moist. No oropharyngeal exudate.  Bilateral hearing loss. Hearing aid - right ear  Eyes: Conjunctivae and EOM are normal. Pupils are equal, round, and reactive to light. Right eye exhibits no discharge. Left eye exhibits no discharge. No scleral icterus.  Neck: Normal range of motion. Neck supple. No JVD present. No tracheal deviation present. No thyromegaly present.  Cardiovascular: Normal rate, regular rhythm, normal heart sounds and intact distal pulses.   No murmur  heard. Pulmonary/Chest: Effort normal. No stridor. No respiratory distress. She has wheezes. She has no rales. She exhibits no tenderness.  Voice hoarse  Abdominal: Soft. Bowel sounds are normal. She exhibits no distension and no mass. There is no tenderness. There is no rebound and no guarding.  Musculoskeletal: Normal range of motion. She exhibits edema (trace). She exhibits no tenderness.  trace  Lymphadenopathy:    She has no cervical adenopathy.  Neurological: She is alert and oriented to person, place, and time. She has normal reflexes. She displays normal reflexes. No cranial nerve deficit. She exhibits normal muscle tone. Coordination normal.  Skin: Skin is warm and dry. No rash noted. She is not diaphoretic. No erythema. No pallor.  Corns on the left foot at the tip of the third toe and at the fifth metacarpal phalangeal joint slightly lateral. Dime sized dry scab to right lateral ankle has fallen off and  expoised ulcerated area.  Psychiatric: She has a normal mood and affect. Her behavior is normal. Judgment and thought content normal.     Labs reviewed: Telephone on 12/02/2014  Component Date Value Ref Range Status  . INR 12/02/2014 1.4* 0.9 - 1.1 Final  Appointment on 11/23/2014  Component Date Value Ref Range Status  . WBC 11/23/2014 5.6  3.9 - 10.0 10e3/uL Final  . RBC 11/23/2014 4.01  3.70 - 5.32 10e6/uL Final  . HGB 11/23/2014 12.8  11.6 - 15.9 g/dL Final  . HCT 11/23/2014 38.5  34.8 - 46.6 % Final  . MCV 11/23/2014 96  81 - 101 fL Final  . MCH 11/23/2014 31.9  26.0 - 34.0 pg Final  . MCHC 11/23/2014 33.2  32.0 - 36.0 g/dL Final  . RDW 11/23/2014 13.8  11.1 - 15.7 % Final  . Platelets 11/23/2014 345  145 - 400 10e3/uL Final  . NEUT# 11/23/2014 3.9  1.5 - 6.5 10e3/uL Final  . LYMPH# 11/23/2014 0.7* 0.9 - 3.3 10e3/uL Final  . MONO# 11/23/2014 0.9  0.1 - 0.9 10e3/uL Final  . Eosinophils Absolute 11/23/2014 0.1  0.0 - 0.5 10e3/uL Final  . BASO# 11/23/2014 0.0  0.0 - 0.2  10e3/uL Final  . NEUT% 11/23/2014 69.6  39.6 - 80.0 % Final  . LYMPH% 11/23/2014 11.6* 14.0 - 48.0 % Final  . MONO% 11/23/2014 16.6* 0.0 - 13.0 % Final  . EOS% 11/23/2014 1.8  0.0 - 7.0 % Final  . BASO% 11/23/2014 0.4  0.0 - 2.0 % Final  . Smear Result 11/23/2014 Smear Available   Final  . Retic Ct Pct 11/23/2014 1.3  0.4 - 2.3 % Final  . RBC. 11/23/2014 3.97  3.87 - 5.11 MIL/uL Final  . ABS Retic 11/23/2014 51.6  19.0 - 186.0 K/uL Final  . Ferritin 11/23/2014 140  9 - 269 ng/ml Final  . Iron 11/23/2014 26* 41 - 142 ug/dL Final  . TIBC 11/23/2014 240  236 - 444 ug/dL Final  . UIBC 11/23/2014 214  120 - 384 ug/dL Final  . %SAT 11/23/2014 11* 21 - 57 % Final  Office Visit on 11/19/2014  Component Date Value Ref Range Status  . INR 11/19/2014 4.6   Final   On ABX   Office Visit on 11/17/2014  Component Date Value Ref Range Status  . Prothrombin Time 11/17/2014 27.2* 11.6 - 15.2 seconds Final  . INR 11/17/2014 2.52* <1.50 Final   Comment: The INR is of principal utility in following patients on stable doses of oral anticoagulants.  The therapeutic range is generally 2.0 to 3.0, but may be 3.0 to 4.0 in patients with mechanical cardiac valves, recurrent embolisms and antiphospholipid antibodies (including lupus inhibitors).   . WBC, Ur, HPF, POC 11/17/2014 0-2   Final  . RBC, urine, microscopic 11/17/2014 neg   Final  . Bacteria, U Microscopic 11/17/2014 neg   Final  . Mucus, UA 11/17/2014 neg   Final  . Epithelial cells, urine per micros 11/17/2014 neg   Final  . Crystals, Ur, HPF, POC 11/17/2014 neg   Final  . Casts, Ur, LPF, POC 11/17/2014 neg   Final  . Yeast, UA 11/17/2014 neg   Final  . Color, UA 11/17/2014 yellow   Final  . Clarity, UA 11/17/2014 clear   Final  . Glucose, UA 11/17/2014 neg   Final  . Bilirubin, UA 11/17/2014 neg   Final  . Ketones, UA 11/17/2014 neg   Final  . Spec Grav, UA  11/17/2014 1.015   Final  . Blood, UA 11/17/2014 neg   Final  . pH, UA 11/17/2014  5.5   Final  . Protein, UA 11/17/2014 trace   Final  . Urobilinogen, UA 11/17/2014 0.2   Final  . Nitrite, UA 11/17/2014 neg   Final  . Leukocytes, UA 11/17/2014 Trace   Final  . WBC 11/17/2014 7.5  4.6 - 10.2 K/uL Final  . Lymph, poc 11/17/2014 0.7  0.6 - 3.4 Final  . POC LYMPH PERCENT 11/17/2014 9.5* 10 - 50 %L Final  . MID (cbc) 11/17/2014 0.2  0 - 0.9 Final  . POC MID % 11/17/2014 2.9  0 - 12 %M Final  . POC Granulocyte 11/17/2014 6.6  2 - 6.9 Final  . Granulocyte percent 11/17/2014 87.6* 37 - 80 %G Final  . RBC 11/17/2014 4.41  4.04 - 5.48 M/uL Final  . Hemoglobin 11/17/2014 13.6  12.2 - 16.2 g/dL Final  . HCT, POC 11/17/2014 42.2  37.7 - 47.9 % Final  . MCV 11/17/2014 95.7  80 - 97 fL Final  . MCH, POC 11/17/2014 30.9  27 - 31.2 pg Final  . MCHC 11/17/2014 32.2  31.8 - 35.4 g/dL Final  . RDW, POC 11/17/2014 15.3   Final  . Platelet Count, POC 11/17/2014 279  142 - 424 K/uL Final  . MPV 11/17/2014 7.5  0 - 99.8 fL Final  Telephone on 10/05/2014  Component Date Value Ref Range Status  . INR 10/05/2014 2.5* 0.9 - 1.1 Final  Appointment on 09/22/2014  Component Date Value Ref Range Status  . WBC 09/22/2014 6.0  3.9 - 10.0 10e3/uL Final  . RBC 09/22/2014 4.02  3.70 - 5.32 10e6/uL Final  . HGB 09/22/2014 12.5  11.6 - 15.9 g/dL Final  . HCT 09/22/2014 39.1  34.8 - 46.6 % Final  . MCV 09/22/2014 97  81 - 101 fL Final  . MCH 09/22/2014 31.1  26.0 - 34.0 pg Final  . MCHC 09/22/2014 32.0  32.0 - 36.0 g/dL Final  . RDW 09/22/2014 22.8* 11.1 - 15.7 % Final  . Platelets 09/22/2014 242  145 - 400 10e3/uL Final  . NEUT# 09/22/2014 4.2  1.5 - 6.5 10e3/uL Final  . LYMPH# 09/22/2014 0.8* 0.9 - 3.3 10e3/uL Final  . MONO# 09/22/2014 0.8  0.1 - 0.9 10e3/uL Final  . Eosinophils Absolute 09/22/2014 0.1  0.0 - 0.5 10e3/uL Final  . BASO# 09/22/2014 0.0  0.0 - 0.2 10e3/uL Final  . NEUT% 09/22/2014 71.0  39.6 - 80.0 % Final  . LYMPH% 09/22/2014 13.9* 14.0 - 48.0 % Final  . MONO% 09/22/2014 12.8   0.0 - 13.0 % Final  . EOS% 09/22/2014 1.8  0.0 - 7.0 % Final  . BASO% 09/22/2014 0.5  0.0 - 2.0 % Final  . Smear Result 09/22/2014 Smear Available   Final  . Iron 09/22/2014 100  41 - 142 ug/dL Final  . TIBC 09/22/2014 266  236 - 444 ug/dL Final  . UIBC 09/22/2014 167  120 - 384 ug/dL Final  . %SAT 09/22/2014 37  21 - 57 % Final  . Ferritin 09/22/2014 184  9 - 269 ng/ml Final  . Retic Ct Pct 09/22/2014 1.5  0.4 - 2.3 % Final  . RBC. 09/22/2014 4.05  3.87 - 5.11 MIL/uL Final  . ABS Retic 09/22/2014 60.8  19.0 - 186.0 K/uL Final  Telephone on 09/22/2014  Component Date Value Ref Range Status  . INR 09/21/2014 2.8* 0.9 - 1.1  Final     Assessment/Plan  1. Ankle ulcer, right, limited to breakdown of skin - silver sulfADIAZINE (SILVADENE) 1 % cream; Apply daily to ulcer on ankle  Dispense: 50 g; Refill: 0 - Aerobic Culture

## 2014-12-22 NOTE — Telephone Encounter (Signed)
LMOM for patient to return call that she received her Coumadin instructions from the [facility] nurse last night.

## 2014-12-23 ENCOUNTER — Ambulatory Visit: Payer: Medicare Other | Admitting: Podiatry

## 2014-12-25 LAB — AEROBIC CULTURE

## 2014-12-28 ENCOUNTER — Telehealth: Payer: Self-pay | Admitting: *Deleted

## 2014-12-28 NOTE — Telephone Encounter (Signed)
Per Dr. Reed--Continue same dose of Coumadin and recheck INR in 1 week  Patient notified and agreed. Faxed results to [place] at [facility] Fax: [phone]

## 2014-12-28 NOTE — Telephone Encounter (Addendum)
Received Protime Results faxed from [facility] 12/28/2014  INR:  2.3  Current Dose of Coumadin is 3mg  Mon,Wed,Fri,Sat,Sun and 6mg  on Tues, Thurs Takes Bactrim DS routinely on MWF indefinitely for treatment of Wegener's Disease Previous INR 1.7 on 12/21/2014. Given to Dr. Mariea Clonts to review and sign.

## 2015-01-01 ENCOUNTER — Telehealth: Payer: Self-pay

## 2015-01-01 NOTE — Telephone Encounter (Signed)
Received note from [place] yesterday at [facility] clinic, the daughter Drexel Iha was asking about the referral to rheumatologist at [facility]. The referral had been done, Earlie Server spoke with the patient 12/09/14 and gave her date and time 05/31/15 at 10:20, told her if she wanted to be sooner if would have to be with another doctor, patient wanted the appt with Dr. Veneta Penton, she had been put on the waiting list if an appt opens up soon. Called [name] with this information. She will call the daughter.

## 2015-01-05 ENCOUNTER — Telehealth: Payer: Self-pay

## 2015-01-05 LAB — POCT INR: INR: 2 — AB (ref 0.9–1.1)

## 2015-01-05 NOTE — Telephone Encounter (Signed)
Per Dr.Green continue current dose and recheck in 2 weeks.   Patient aware of results and verbalized understanding of instructions   Faxed paper back to [facility] @ [phone]

## 2015-01-05 NOTE — Telephone Encounter (Signed)
Received fax from [facility]. Current PT/INR 2.0 Current dose- 3 mg M/W/F/Sat/Sun, 6 mg T/Th  Previous PT/INR (1 week ago) 2.3  Please advise   *Takes Bactrim DS routinely on M/W/F for treatment of Wegener Disease

## 2015-01-15 ENCOUNTER — Ambulatory Visit: Payer: Medicare Other | Admitting: Podiatry

## 2015-01-15 ENCOUNTER — Ambulatory Visit (INDEPENDENT_AMBULATORY_CARE_PROVIDER_SITE_OTHER): Payer: Medicare Other | Admitting: Podiatry

## 2015-01-15 DIAGNOSIS — B351 Tinea unguium: Secondary | ICD-10-CM | POA: Diagnosis not present

## 2015-01-15 DIAGNOSIS — Q828 Other specified congenital malformations of skin: Secondary | ICD-10-CM

## 2015-01-15 DIAGNOSIS — I739 Peripheral vascular disease, unspecified: Secondary | ICD-10-CM

## 2015-01-15 DIAGNOSIS — M79675 Pain in left toe(s): Secondary | ICD-10-CM

## 2015-01-15 DIAGNOSIS — M79674 Pain in right toe(s): Secondary | ICD-10-CM

## 2015-01-15 DIAGNOSIS — M79676 Pain in unspecified toe(s): Secondary | ICD-10-CM

## 2015-01-16 NOTE — Progress Notes (Signed)
Subjective:     Patient ID: [name], female   DOB: 1933-06-16, 81 y.o.   MRN: [id number]  HPIPatient says the corn on her fourth toe has returned and is painful walking and wearing her shoes.  She also has callus under the outer ball of her left foot which is painful walking.  Both of these sites have previouslu ulcerated but now are callused.   Review of Systems     Objective:   Physical Exam Objective: Review of past medical history, medications, social history and allergies were performed.  Vascular: Dorsalis pedis and posterior tibial pulses were not  palpable B/L, capillary refill was  WNL B/L, temperature gradient was WNL B/L   Skin:  Corn fourth toe due to deformed position fourth toe left foot.  Nails: thick disfigured discolored nails both feet with no infection noted.  Sensory: Thornell Mule monifilament WNL   Orthopedic: Orthopedic evaluation demonstrates all joints distal t ankle have full ROM without crepitus, muscle power WNL B/L.  Deformed fourth toe left foot.     Assessment:     Onychomycosis  Corn fourth toe left foot  Porokeratosis left foot.     Plan:    Debride nails   Debride porokeratosis

## 2015-01-19 ENCOUNTER — Telehealth: Payer: Self-pay | Admitting: *Deleted

## 2015-01-19 ENCOUNTER — Other Ambulatory Visit: Payer: Self-pay | Admitting: Internal Medicine

## 2015-01-19 NOTE — Telephone Encounter (Signed)
Received protime Results from [facility] 01/19/2015  INR: 2.7  Current Dose of Coumadin: 3mg  Mon,Wed,Fri,Sat,Sun and 6mg  on Tuesday and Thursday Patient takes Bactrim DS indefinitely on Mon,Wed,Fri.  Given to Dr Nyoka Cowden to review and sign.

## 2015-01-20 ENCOUNTER — Other Ambulatory Visit: Payer: Medicare Other

## 2015-01-20 ENCOUNTER — Ambulatory Visit: Payer: Medicare Other | Admitting: Family

## 2015-01-20 ENCOUNTER — Other Ambulatory Visit: Payer: Self-pay

## 2015-01-20 MED ORDER — WARFARIN SODIUM 3 MG PO TABS: ORAL_TABLET | ORAL | Status: DC

## 2015-01-21 ENCOUNTER — Encounter: Payer: Self-pay | Admitting: Internal Medicine

## 2015-01-21 ENCOUNTER — Non-Acute Institutional Stay: Payer: Medicare Other | Admitting: Internal Medicine

## 2015-01-21 VITALS — BP 114/62 | HR 84 | Temp 98.1°F | Wt 127.0 lb

## 2015-01-21 DIAGNOSIS — D638 Anemia in other chronic diseases classified elsewhere: Secondary | ICD-10-CM

## 2015-01-21 DIAGNOSIS — L97311 Non-pressure chronic ulcer of right ankle limited to breakdown of skin: Secondary | ICD-10-CM | POA: Diagnosis not present

## 2015-01-21 DIAGNOSIS — M3130 Wegener's granulomatosis without renal involvement: Secondary | ICD-10-CM | POA: Diagnosis not present

## 2015-01-21 DIAGNOSIS — H9192 Unspecified hearing loss, left ear: Secondary | ICD-10-CM | POA: Diagnosis not present

## 2015-01-21 DIAGNOSIS — R609 Edema, unspecified: Secondary | ICD-10-CM

## 2015-01-21 DIAGNOSIS — J329 Chronic sinusitis, unspecified: Secondary | ICD-10-CM

## 2015-01-21 DIAGNOSIS — Z7901 Long term (current) use of anticoagulants: Secondary | ICD-10-CM

## 2015-01-21 NOTE — Progress Notes (Signed)
Patient ID: [name], female   DOB: 07-06-1932, 81 y.o.   MRN: [id number]    [facility]     [facility] (12[facility]     Allergies  Allergen Reactions  . Adhesive [Tape] Rash    Chief Complaint  Patient presents with  . Medical Management of Chronic Issues    Wegener's granulomatosis    HPI:  Patient is here for routine follow-up of multiple medical problems.  She has Wegener's granulomatosis. She now has an appointment to see Dr. Veneta Penton, rheumatologist at [facility]. Patient has recurrent episodes of bronchitis. She still is producing small quantities of sputum. Occasionally they're blood streaks in the sputum.  Right ankle lateral malleolus ulcer seems improved to her. She is using silver sulfadiazine to it daily.  Left shoulder pain continues to be a problem with painful abduction.  Patient has seen her ophthalmologist recently because of a persistent drainage from the left eye. He denies any irritation of this area such as itching or pain  She continues Zoloft term use of anticoagulation therapy because of a history of pulmonary emboli.  Medications: Patient's Medications  New Prescriptions   No medications on file  Previous Medications   ACETAMINOPHEN (TYLENOL) 325 MG TABLET    Take 2 tablets (650 mg total) by mouth every 6 (six) hours as needed for mild pain (or Fever >/= 101).   ADVAIR DISKUS 500-50 MCG/DOSE AEPB    INHALE 1 PUFF EVERY 12 HOURS.   B COMPLEX VITAMINS CAPSULE    Take 1 capsule by mouth daily.   CALCIUM-VITAMIN D (OSCAL WITH D) 500-200 MG-UNIT PER TABLET    Take 1 tablet by mouth daily with breakfast.   CHOLECALCIFEROL (VITAMIN D) 1000 UNITS TABLET    One daily for vitamin D supplement   FLUTICASONE (FLONASE) 50 MCG/ACT NASAL SPRAY    Place 1 spray into both nostrils daily.    GUAIFENESIN (MUCINEX) 600 MG 12 HR TABLET    Take 600 mg by mouth 2 (two) times daily.   LATANOPROST (XALATAN) 0.005 % OPHTHALMIC SOLUTION    Place 1  drop into both eyes at bedtime.   MULTIPLE VITAMINS-MINERALS (MULTIVITAMIN WITH MINERALS) TABLET    Take 1 tablet by mouth daily.   PANTOPRAZOLE (PROTONIX) 40 MG TABLET    TAKE 1 TABLET TWICE DAILY.   SACCHAROMYCES BOULARDII (FLORASTOR) 250 MG CAPSULE    Take 250 mg by mouth. Take one daily   SILVER SULFADIAZINE (SILVADENE) 1 % CREAM    Apply daily to ulcer on ankle   SODIUM CHLORIDE (OCEAN) 0.65 % SOLN NASAL SPRAY    Place 1 spray into both nostrils. One spray both nostrils as directed   SULFAMETHOXAZOLE-TRIMETHOPRIM (BACTRIM DS,SEPTRA DS) 800-160 MG PER TABLET    Take 1 tablet by mouth every Monday, Wednesday, and Friday. HOLD WHILE ON AUGMENTIN, THEN RESUME AS PREVIOUS   TIMOLOL (BETIMOL) 0.5 % OPHTHALMIC SOLUTION    Place 1 drop into the left eye daily with breakfast.    WARFARIN (COUMADIN) 3 MG TABLET    TAKE ONE TABLET ON MON,WED,FRIDAY,SAT,AND SUNDAY, AND 2 TABLETS ON TUESDAY/THURSDAY.  Modified Medications   No medications on file  Discontinued Medications   LORAZEPAM (ATIVAN) 0.5 MG TABLET    Take 1 tablet (0.5 mg total) by mouth every 6 (six) hours as needed for anxiety.   PHYTONADIONE (VITAMIN K) 5 MG TABLET    Take 1 tablet (5 mg total) by mouth once.     Review  of Systems  Constitutional: Positive for fatigue. Negative for fever, chills, diaphoresis, activity change, appetite change and unexpected weight change.       Frail and generally weak.  HENT: Positive for hearing loss (Deaf on left, hearing aid on right ) and voice change (Hoarse). Negative for congestion, ear discharge, ear pain, postnasal drip, rhinorrhea, sore throat, tinnitus and trouble swallowing.   Eyes: Negative.  Negative for pain, redness, itching and visual disturbance.  Respiratory: Positive for cough, shortness of breath and wheezing (Intermittent). Negative for choking.        History Wegener's granulomatosis. History of chronic sinusitis related to her Wegener's.  Cardiovascular: Positive for leg swelling.  Negative for chest pain and palpitations.  Gastrointestinal: Negative for nausea, abdominal pain, diarrhea, constipation and abdominal distention.       History of GERD.  Endocrine: Negative for cold intolerance, heat intolerance, polydipsia, polyphagia and polyuria.       History of hyperglycemia induced by steroid use.  Genitourinary: Positive for frequency. Negative for dysuria, urgency, hematuria, flank pain, vaginal discharge, difficulty urinating and pelvic pain.       Frequency induced by furosemide. Episodes of incontinence of urine.  Musculoskeletal: Negative for myalgias, back pain, arthralgias, gait problem, neck pain and neck stiffness.       Generalized weakness  Skin: Negative.  Negative for color change, pallor and rash.  Allergic/Immunologic: Negative.   Neurological: Negative for dizziness, tremors, seizures, syncope, weakness, numbness and headaches.  Hematological: Negative for adenopathy. Does not bruise/bleed easily.       Chronic anemia.  Psychiatric/Behavioral: Negative.  Negative for suicidal ideas, hallucinations, behavioral problems, confusion, sleep disturbance, dysphoric mood and agitation. The patient is not nervous/anxious and is not hyperactive.     Filed Vitals:   01/21/15 1523  BP: 114/62  Pulse: 84  Temp: 98.1 F (36.7 C)  TempSrc: Oral  Weight: 127 lb (57.607 kg)  SpO2: 91%   Body mass index is 24.01 kg/(m^2).  Physical Exam  Constitutional: She is oriented to person, place, and time. She appears well-developed. No distress.  Thin and frail. Weight stable  since 3/16  HENT:  Head: Normocephalic and atraumatic.  Nose: Nose normal.  Mouth/Throat: Oropharynx is clear and moist. No oropharyngeal exudate.  Bilateral hearing loss. Hearing aid - right ear  Eyes: Conjunctivae and EOM are normal. Pupils are equal, round, and reactive to light. Right eye exhibits no discharge. Left eye exhibits no discharge. No scleral icterus.  Neck: Normal range of  motion. Neck supple. No JVD present. No tracheal deviation present. No thyromegaly present.  Cardiovascular: Normal rate, regular rhythm, normal heart sounds and intact distal pulses.   No murmur heard. Pulmonary/Chest: Effort normal. No stridor. No respiratory distress. She has wheezes. She has no rales. She exhibits no tenderness.  Voice hoarse  Abdominal: Soft. Bowel sounds are normal. She exhibits no distension and no mass. There is no tenderness. There is no rebound and no guarding.  Musculoskeletal: Normal range of motion. She exhibits edema (trace). She exhibits no tenderness.  Lymphadenopathy:    She has no cervical adenopathy.  Neurological: She is alert and oriented to person, place, and time. She has normal reflexes. She displays normal reflexes. No cranial nerve deficit. She exhibits normal muscle tone. Coordination normal.  Skin: Skin is warm and dry. No rash noted. She is not diaphoretic. No erythema. No pallor.  Corns on the left foot at the tip of the third toe and at the fifth metacarpal phalangeal joint  slightly lateral. Dime sized dry scab to right lateral ankle has fallen off and exposed ulcerated area.  Psychiatric: She has a normal mood and affect. Her behavior is normal. Judgment and thought content normal.     Labs reviewed: Telephone on 01/05/2015  Component Date Value Ref Range Status  . INR 01/05/2015 2.0* 0.9 - 1.1 Final  Office Visit on 12/22/2014  Component Date Value Ref Range Status  . Aerobic Bacterial Culture 12/22/2014 Final report   Final  . Result 1 12/22/2014 Mixed skin flora   Final   Heavy growth  Telephone on 12/02/2014  Component Date Value Ref Range Status  . INR 12/02/2014 1.4* 0.9 - 1.1 Final  Appointment on 11/23/2014  Component Date Value Ref Range Status  . WBC 11/23/2014 5.6  3.9 - 10.0 10e3/uL Final  . RBC 11/23/2014 4.01  3.70 - 5.32 10e6/uL Final  . HGB 11/23/2014 12.8  11.6 - 15.9 g/dL Final  . HCT 11/23/2014 38.5  34.8 - 46.6 %  Final  . MCV 11/23/2014 96  81 - 101 fL Final  . MCH 11/23/2014 31.9  26.0 - 34.0 pg Final  . MCHC 11/23/2014 33.2  32.0 - 36.0 g/dL Final  . RDW 11/23/2014 13.8  11.1 - 15.7 % Final  . Platelets 11/23/2014 345  145 - 400 10e3/uL Final  . NEUT# 11/23/2014 3.9  1.5 - 6.5 10e3/uL Final  . LYMPH# 11/23/2014 0.7* 0.9 - 3.3 10e3/uL Final  . MONO# 11/23/2014 0.9  0.1 - 0.9 10e3/uL Final  . Eosinophils Absolute 11/23/2014 0.1  0.0 - 0.5 10e3/uL Final  . BASO# 11/23/2014 0.0  0.0 - 0.2 10e3/uL Final  . NEUT% 11/23/2014 69.6  39.6 - 80.0 % Final  . LYMPH% 11/23/2014 11.6* 14.0 - 48.0 % Final  . MONO% 11/23/2014 16.6* 0.0 - 13.0 % Final  . EOS% 11/23/2014 1.8  0.0 - 7.0 % Final  . BASO% 11/23/2014 0.4  0.0 - 2.0 % Final  . Smear Result 11/23/2014 Smear Available   Final  . Retic Ct Pct 11/23/2014 1.3  0.4 - 2.3 % Final  . RBC. 11/23/2014 3.97  3.87 - 5.11 MIL/uL Final  . ABS Retic 11/23/2014 51.6  19.0 - 186.0 K/uL Final  . Ferritin 11/23/2014 140  9 - 269 ng/ml Final  . Iron 11/23/2014 26* 41 - 142 ug/dL Final  . TIBC 11/23/2014 240  236 - 444 ug/dL Final  . UIBC 11/23/2014 214  120 - 384 ug/dL Final  . %SAT 11/23/2014 11* 21 - 57 % Final  Office Visit on 11/19/2014  Component Date Value Ref Range Status  . INR 11/19/2014 4.6   Final   On ABX   Office Visit on 11/17/2014  Component Date Value Ref Range Status  . Prothrombin Time 11/17/2014 27.2* 11.6 - 15.2 seconds Final  . INR 11/17/2014 2.52* <1.50 Final   Comment: The INR is of principal utility in following patients on stable doses of oral anticoagulants.  The therapeutic range is generally 2.0 to 3.0, but may be 3.0 to 4.0 in patients with mechanical cardiac valves, recurrent embolisms and antiphospholipid antibodies (including lupus inhibitors).   . WBC, Ur, HPF, POC 11/17/2014 0-2   Final  . RBC, urine, microscopic 11/17/2014 neg   Final  . Bacteria, U Microscopic 11/17/2014 neg   Final  . Mucus, UA 11/17/2014 neg   Final  .  Epithelial cells, urine per micros 11/17/2014 neg   Final  . Crystals, Ur, HPF, POC 11/17/2014 neg  Final  . Casts, Ur, LPF, POC 11/17/2014 neg   Final  . Yeast, UA 11/17/2014 neg   Final  . Color, UA 11/17/2014 yellow   Final  . Clarity, UA 11/17/2014 clear   Final  . Glucose, UA 11/17/2014 neg   Final  . Bilirubin, UA 11/17/2014 neg   Final  . Ketones, UA 11/17/2014 neg   Final  . Spec Grav, UA 11/17/2014 1.015   Final  . Blood, UA 11/17/2014 neg   Final  . pH, UA 11/17/2014 5.5   Final  . Protein, UA 11/17/2014 trace   Final  . Urobilinogen, UA 11/17/2014 0.2   Final  . Nitrite, UA 11/17/2014 neg   Final  . Leukocytes, UA 11/17/2014 Trace   Final  . WBC 11/17/2014 7.5  4.6 - 10.2 K/uL Final  . Lymph, poc 11/17/2014 0.7  0.6 - 3.4 Final  . POC LYMPH PERCENT 11/17/2014 9.5* 10 - 50 %L Final  . MID (cbc) 11/17/2014 0.2  0 - 0.9 Final  . POC MID % 11/17/2014 2.9  0 - 12 %M Final  . POC Granulocyte 11/17/2014 6.6  2 - 6.9 Final  . Granulocyte percent 11/17/2014 87.6* 37 - 80 %G Final  . RBC 11/17/2014 4.41  4.04 - 5.48 M/uL Final  . Hemoglobin 11/17/2014 13.6  12.2 - 16.2 g/dL Final  . HCT, POC 11/17/2014 42.2  37.7 - 47.9 % Final  . MCV 11/17/2014 95.7  80 - 97 fL Final  . MCH, POC 11/17/2014 30.9  27 - 31.2 pg Final  . MCHC 11/17/2014 32.2  31.8 - 35.4 g/dL Final  . RDW, POC 11/17/2014 15.3   Final  . Platelet Count, POC 11/17/2014 279  142 - 424 K/uL Final  . MPV 11/17/2014 7.5  0 - 99.8 fL Final     Assessment/Plan 1. Ankle ulcer, right, limited to breakdown of skin Improved. Continue SSD cream  2. Anemia of chronic disease Resolved  3. Edema Persistent at trace to 1+ bilaterally  4. Long term current use of anticoagulant therapy Continue warfarin at present dose. Therapeutic indications previous history of pulmonary emboli.  5. Chronic sinusitis, unspecified location Continues with some sinus drainage.  71. Deaf, left Persistent problems with hearing. Says she  needs a cochlear implant.  Chauncey Has appointment to see Dr. Veneta Penton

## 2015-01-26 DIAGNOSIS — H4720 Unspecified optic atrophy: Secondary | ICD-10-CM | POA: Insufficient documentation

## 2015-01-26 DIAGNOSIS — Z961 Presence of intraocular lens: Secondary | ICD-10-CM | POA: Insufficient documentation

## 2015-01-27 ENCOUNTER — Other Ambulatory Visit: Payer: Medicare Other

## 2015-01-27 ENCOUNTER — Ambulatory Visit: Payer: Medicare Other | Admitting: Family

## 2015-02-03 ENCOUNTER — Other Ambulatory Visit (HOSPITAL_BASED_OUTPATIENT_CLINIC_OR_DEPARTMENT_OTHER): Payer: Medicare Other

## 2015-02-03 ENCOUNTER — Encounter: Payer: Self-pay | Admitting: Family

## 2015-02-03 ENCOUNTER — Ambulatory Visit (HOSPITAL_BASED_OUTPATIENT_CLINIC_OR_DEPARTMENT_OTHER): Payer: Medicare Other | Admitting: Family

## 2015-02-03 ENCOUNTER — Other Ambulatory Visit: Payer: Self-pay | Admitting: Internal Medicine

## 2015-02-03 VITALS — BP 137/60 | HR 80 | Temp 97.8°F | Resp 14 | Ht 61.0 in | Wt 129.0 lb

## 2015-02-03 DIAGNOSIS — M3130 Wegener's granulomatosis without renal involvement: Secondary | ICD-10-CM | POA: Diagnosis not present

## 2015-02-03 DIAGNOSIS — D638 Anemia in other chronic diseases classified elsewhere: Secondary | ICD-10-CM

## 2015-02-03 DIAGNOSIS — D509 Iron deficiency anemia, unspecified: Secondary | ICD-10-CM | POA: Diagnosis not present

## 2015-02-03 LAB — CBC WITH DIFFERENTIAL (CANCER CENTER ONLY)
BASO#: 0 10*3/uL (ref 0.0–0.2)
BASO%: 0.6 % (ref 0.0–2.0)
EOS ABS: 0.2 10*3/uL (ref 0.0–0.5)
EOS%: 3.6 % (ref 0.0–7.0)
HCT: 37 % (ref 34.8–46.6)
HGB: 12.3 g/dL (ref 11.6–15.9)
LYMPH#: 0.7 10*3/uL — ABNORMAL LOW (ref 0.9–3.3)
LYMPH%: 14.2 % (ref 14.0–48.0)
MCH: 31.8 pg (ref 26.0–34.0)
MCHC: 33.2 g/dL (ref 32.0–36.0)
MCV: 96 fL (ref 81–101)
MONO#: 0.8 10*3/uL (ref 0.1–0.9)
MONO%: 14.8 % — AB (ref 0.0–13.0)
NEUT#: 3.4 10*3/uL (ref 1.5–6.5)
NEUT%: 66.8 % (ref 39.6–80.0)
Platelets: 302 10*3/uL (ref 145–400)
RBC: 3.87 10*6/uL (ref 3.70–5.32)
RDW: 16.2 % — ABNORMAL HIGH (ref 11.1–15.7)
WBC: 5.1 10*3/uL (ref 3.9–10.0)

## 2015-02-03 LAB — RETICULOCYTES (CHCC)
ABS Retic: 62.4 10*3/uL (ref 19.0–186.0)
RBC.: 3.9 MIL/uL (ref 3.87–5.11)
Retic Ct Pct: 1.6 % (ref 0.4–2.3)

## 2015-02-03 NOTE — Progress Notes (Signed)
Hematology and Oncology Follow Up Visit  [name] [id number] Oct 07, 1932 81 y.o. 02/03/2015   Principle Diagnosis:  Recurrent iron deficiency anemia Wegener's granulomatosis  Current Therapy:   IV iron as indicated    Interim History:  [name] is here for a follow-up. She is doing well. She started taking Melatonin and is able to sleep much better. She still takes a nap in the afternoon.  She states that one of the doctors that she sees feels she may have the beginnings of heart failure. She has a follow-up appointment with them in 2 weeks for further evaluation. She does not remember their name.  She denies fever, chills, n/v, rash, chest pain, palpitations, abdominal pain, no changes in her bowel or bladder habits. She has trouble with urinary incontinence at night. She is usually up 5-6 times a night.  She has had no more episodes of bleeding on Coumadin. No blood in her urine or stool. No bruising or petechiae.  No lymphadenopathy found on assessment.  She has a chronic cough from the wegener's granulomatosis. She denies SOB.  No new aches or pains. She has some swelling around the ankles at times that comes and goes. No swelling at this time. She has neuropathy in her feet that is worse when she first gets up in the morning. She can not walk barefoot.  She is eating well and staying hydrated. Her weight is stable.   Medications:    Medication List       This list is accurate as of: 02/03/15  3:00 PM.  Always use your most recent med list.               acetaminophen 325 MG tablet  Commonly known as:  TYLENOL  Take 2 tablets (650 mg total) by mouth every 6 (six) hours as needed for mild pain (or Fever >/= 101).     ADVAIR DISKUS 500-50 MCG/DOSE Aepb  Generic drug:  Fluticasone-Salmeterol  INHALE 1 PUFF EVERY 12 HOURS.     b complex vitamins capsule  Take 1 capsule by mouth daily.     calcium-vitamin D 500-200 MG-UNIT per tablet  Commonly known as:  OSCAL WITH D    Take 1 tablet by mouth daily with breakfast.     cholecalciferol 1000 UNITS tablet  Commonly known as:  VITAMIN D  One daily for vitamin D supplement     fluticasone 50 MCG/ACT nasal spray  Commonly known as:  FLONASE  Place 1 spray into both nostrils daily.     guaiFENesin 600 MG 12 hr tablet  Commonly known as:  MUCINEX  Take 600 mg by mouth 2 (two) times daily.     latanoprost 0.005 % ophthalmic solution  Commonly known as:  XALATAN  Place 1 drop into both eyes at bedtime.     Melatonin 1 MG Tabs  Take by mouth at bedtime.     multivitamin with minerals tablet  Take 1 tablet by mouth daily.     pantoprazole 40 MG tablet  Commonly known as:  PROTONIX  TAKE 1 TABLET TWICE DAILY.     saccharomyces boulardii 250 MG capsule  Commonly known as:  FLORASTOR  Take 250 mg by mouth. Take one daily     silver sulfADIAZINE 1 % cream  Commonly known as:  SILVADENE  Apply daily to ulcer on ankle     sodium chloride 0.65 % Soln nasal spray  Commonly known as:  OCEAN  Place 1 spray into  both nostrils. One spray both nostrils as directed     sulfamethoxazole-trimethoprim 800-160 MG per tablet  Commonly known as:  BACTRIM DS,SEPTRA DS  Take 1 tablet by mouth every Monday, Wednesday, and Friday. HOLD WHILE ON AUGMENTIN, THEN RESUME AS PREVIOUS     timolol 0.5 % ophthalmic solution  Commonly known as:  BETIMOL  Place 1 drop into the left eye daily with breakfast.     warfarin 3 MG tablet  Commonly known as:  COUMADIN  TAKE ONE TABLET ON MON,WED,FRIDAY,SAT,AND SUNDAY, AND 2 TABLETS ON TUESDAY/THURSDAY.        Allergies:  Allergies  Allergen Reactions  . Adhesive [Tape] Rash    Past Medical History, Surgical history, Social history, and Family History were reviewed and updated.  Review of Systems: All other 10 point review of systems is negative.   Physical Exam:  height is 5\' 1"  (1.549 m) and weight is 129 lb (58.514 kg). Her oral temperature is 97.8 F (36.6 C).  Her blood pressure is 137/60 and her pulse is 80. Her respiration is 14.   Wt Readings from Last 3 Encounters:  02/03/15 129 lb (58.514 kg)  01/21/15 127 lb (57.607 kg)  12/22/14 124 lb 12.8 oz (56.609 kg)    Ocular: Sclerae unicteric, pupils equal, round and reactive to light Ear-nose-throat: Oropharynx clear, dentition fair Lymphatic: No cervical or supraclavicular adenopathy Lungs no rales or rhonchi, good excursion bilaterally Heart regular rate and rhythm, no murmur appreciated Abd soft, nontender, positive bowel sounds MSK no focal spinal tenderness, no joint edema Neuro: non-focal, well-oriented, appropriate affect Breasts: Deferred  Lab Results  Component Value Date   WBC 5.1 02/03/2015   HGB 12.3 02/03/2015   HCT 37.0 02/03/2015   MCV 96 02/03/2015   PLT 302 02/03/2015   Lab Results  Component Value Date   FERRITIN 140 11/23/2014   IRON 26* 11/23/2014   TIBC 240 11/23/2014   UIBC 214 11/23/2014   IRONPCTSAT 11* 11/23/2014   Lab Results  Component Value Date   RETICCTPCT 1.3 11/23/2014   RBC 3.87 02/03/2015   RETICCTABS 51.6 11/23/2014   No results found for: KPAFRELGTCHN, LAMBDASER, KAPLAMBRATIO No results found for: IGGSERUM, IGA, IGMSERUM No results found for: Odetta Pink, SPEI   Chemistry      Component Value Date/Time   NA 140 08/16/2014 0341   NA 144 08/01/2014   K 3.7 08/16/2014 0341   CL 105 08/16/2014 0341   CO2 25 08/16/2014 0341   BUN 27* 08/16/2014 0341   BUN 13 08/01/2014   CREATININE 0.94 08/16/2014 0341   CREATININE 0.6 08/01/2014   GLU 91 08/01/2014      Component Value Date/Time   CALCIUM 9.6 08/16/2014 0341   ALKPHOS 90 08/01/2014   AST 22 08/01/2014   ALT 31 08/01/2014   BILITOT 0.6 07/28/2014 0545     Impression and Plan: [name] is 81 year old female with iron deficiency anemia and a history of wegener's granulomatosis. She is resting much better but still having to  take a nap in the afternoon.  Her Hgb today is 12.3 with an MCV of 96. We will see what her iron studies show. I will call her with the results tomorrow. If she happens to need Feraheme we will bring her in later this week or early next week.  We will plan to see her back in 3 months for labs and follow-up.  She knows to call with any questions or  concerns. We can certainly see her sooner if need be.   Eliezer Bottom, NP 8/3/20163:00 PM

## 2015-02-04 ENCOUNTER — Telehealth: Payer: Self-pay | Admitting: Family

## 2015-02-04 LAB — IRON AND TIBC CHCC
%SAT: 17 % — AB (ref 21–57)
IRON: 39 ug/dL — AB (ref 41–142)
TIBC: 233 ug/dL — AB (ref 236–444)
UIBC: 194 ug/dL (ref 120–384)

## 2015-02-04 LAB — FERRITIN CHCC: Ferritin: 146 ng/ml (ref 9–269)

## 2015-02-04 NOTE — Telephone Encounter (Signed)
Spoke with [name] and notified her of her iron study results. She does not need iron at this time. She is very happy with this. We will see her at her next appointment with Dr. Marin Olp in November.

## 2015-02-05 ENCOUNTER — Other Ambulatory Visit: Payer: Self-pay | Admitting: *Deleted

## 2015-02-05 MED ORDER — FLUTICASONE-SALMETEROL 500-50 MCG/DOSE IN AEPB: INHALATION_SPRAY | RESPIRATORY_TRACT | Status: AC

## 2015-02-12 ENCOUNTER — Encounter: Payer: Self-pay | Admitting: Podiatry

## 2015-02-12 ENCOUNTER — Ambulatory Visit (INDEPENDENT_AMBULATORY_CARE_PROVIDER_SITE_OTHER): Payer: Medicare Other | Admitting: Podiatry

## 2015-02-12 VITALS — BP 122/70 | HR 85 | Resp 17

## 2015-02-12 DIAGNOSIS — Q828 Other specified congenital malformations of skin: Secondary | ICD-10-CM

## 2015-02-12 DIAGNOSIS — L84 Corns and callosities: Secondary | ICD-10-CM

## 2015-02-12 NOTE — Progress Notes (Signed)
Subjective:     Patient ID: [name], female   DOB: [birth year], 81 y.o.   MRN: [id number]  HPIPatient says the corn on her fourth toe has returned and is painful walking and wearing her shoes.  She also has callus under the outer ball of her left foot which is painful walking.  Both of these sites have previouslu ulcerated but now are callused.   Review of Systems     Objective:   Physical Exam Objective: Review of past medical history, medications, social history and allergies were performed.  Vascular: Dorsalis pedis and posterior tibial pulses were not  palpable B/L, capillary refill was  WNL B/L, temperature gradient was WNL B/L   Skin:  Corn fourth toe due to deformed position fourth toe left foot.  Nails: thick disfigured discolored nails both feet with no infection noted.  Sensory: Thornell Mule monifilament WNL   Orthopedic: Orthopedic evaluation demonstrates all joints distal t ankle have full ROM without crepitus, muscle power WNL B/L.  Deformed fourth toe left foot.     Assessment:     Onychomycosis  Corn fourth toe left foot  Porokeratosis left foot.     Plan:    Debride nails   Debride porokeratosis

## 2015-02-19 ENCOUNTER — Telehealth: Payer: Self-pay | Admitting: *Deleted

## 2015-02-19 LAB — POCT INR: INR: 3.6 — AB (ref 0.9–1.1)

## 2015-02-19 MED ORDER — WARFARIN SODIUM 3 MG PO TABS: ORAL_TABLET | ORAL | Status: DC

## 2015-02-19 NOTE — Telephone Encounter (Signed)
Per Dr. Reed--Decrease Coumadin to 3mg  Mon,Wed,Thurs,Fri,Sat,Sun and 6mg  on Tues only. And  Recheck 02/26/2015 Patient notified and understood. Faxed results back to [place] at [facility] Fax: [phone]

## 2015-02-19 NOTE — Telephone Encounter (Signed)
Protime Results from [facility] 02/19/2015  INR:  3.6  Current Dose of Coumadin: 3mg  on Mon,Wed,Fri,Sat,Sun adn 6mg  on Tuesday and Thursday Patient takes Bactrim DS indefinetly on M,W,F Given to Dr. Mariea Clonts to review and sign.

## 2015-02-22 ENCOUNTER — Encounter: Payer: Self-pay | Admitting: *Deleted

## 2015-02-22 ENCOUNTER — Ambulatory Visit (INDEPENDENT_AMBULATORY_CARE_PROVIDER_SITE_OTHER): Payer: Medicare Other | Admitting: Cardiology

## 2015-02-22 VITALS — BP 124/64 | HR 83 | Ht 61.0 in | Wt 128.0 lb

## 2015-02-22 DIAGNOSIS — R600 Localized edema: Secondary | ICD-10-CM | POA: Diagnosis not present

## 2015-02-22 DIAGNOSIS — I5022 Chronic systolic (congestive) heart failure: Secondary | ICD-10-CM

## 2015-02-22 NOTE — Patient Instructions (Signed)
Medication Instructions:  Your physician recommends that you continue on your current medications as directed. Please refer to the Current Medication list given to you today.   Labwork: None   Testing/Procedures: Your physician has requested that you have an echocardiogram. Echocardiography is a painless test that uses sound waves to create images of your heart. It provides your doctor with information about the size and shape of your heart and how well your heart's chambers and valves are working. This procedure takes approximately one hour. There are no restrictions for this procedure.   Follow-Up: Your physician wants you to follow-up in: 6 months with Dr.Nelson You will receive a reminder letter in the mail two months in advance. If you don't receive a letter, please call our office to schedule the follow-up appointment.   Any Other Special Instructions Will Be Listed Below (If Applicable).

## 2015-02-22 NOTE — Progress Notes (Signed)
Patient ID: [name], female   DOB: Sep 28, 1932, 81 y.o.   MRN: [id number]      Cardiology Office Note   Date:  02/22/2015   ID:  [name], DOB Sep 10, 1932, MRN [id number]  PCP:  Estill Dooms, MD  Cardiologist: Dorothy Spark, MD   No chief complaint on file.  History of Present Illness: [name] is a 81 y.o. female who presents for evaluation of chronic CHF. In 08/2014 she was admitted with acute respiratory failure sec to mucous plug. She developed AKI and sinus tachycardia.  The patient is very hard of hearing and has h/o Wegener's granulomatosis, she lives in a nursing home. She states that her PCP was concern about on and off LE edema. She states that she walks slowly, but doesn't feel limited by DOE, CP, has no PND or orthopnea. No palpitations or syncope. She is on coumadin for h/o pulmonary embolism.  Past Medical History  Diagnosis Date  . Wegener's granulomatosis   . OA (osteoarthritis)   . Blindness of one eye   . Hearing loss in left ear     hearing aid both ears  . Bladder incontinence   . Glaucoma   . Clotting disorder   . History of pulmonary embolism 1997  . Shortness of breath dyspnea   . Pneumonia 12/15  . Multiple allergies   . Chronic sinusitis   . Acute respiratory failure with hypoxia   . Interstitial emphysema   . Aspergillosis     Past Surgical History  Procedure Laterality Date  . Tubal ligation    . Pubovaginal sling    . Cataract extraction    . Melanoma excision      left leg  . Vena cava filter placement  1997    [place] filter  . Knee arthroscopy  2006    rt   . Inguinal hernia repair Left 02/17/2014    Procedure: LEFT INGUINAL HERNIA REPAIR WITH MESH;  Surgeon: Harl Bowie, MD;  Location: [facility];  Service: General;  Laterality: Left;  . Insertion of mesh N/A 02/17/2014    Procedure: INSERTION OF MESH;  Surgeon: Harl Bowie, MD;  Location: [facility];  Service:  General;  Laterality: N/A;     Current Outpatient Prescriptions  Medication Sig Dispense Refill  . acetaminophen (TYLENOL) 325 MG tablet Take 2 tablets (650 mg total) by mouth every 6 (six) hours as needed for mild pain (or Fever >/= 101). 30 tablet 2  . b complex vitamins capsule Take 1 capsule by mouth daily.    . calcium-vitamin D (OSCAL WITH D) 500-200 MG-UNIT per tablet Take 1 tablet by mouth daily with breakfast.    . cholecalciferol (VITAMIN D) 1000 UNITS tablet One daily for vitamin D supplement 30 tablet 11  . fluticasone (FLONASE) 50 MCG/ACT nasal spray Place 1 spray into both nostrils daily.   5  . Fluticasone-Salmeterol (ADVAIR DISKUS) 500-50 MCG/DOSE AEPB INHALE 1 PUFF EVERY 12 HOURS. 180 each 1  . guaiFENesin (MUCINEX) 600 MG 12 hr tablet Take 600 mg by mouth 2 (two) times daily.    Marland Kitchen latanoprost (XALATAN) 0.005 % ophthalmic solution Place 1 drop into both eyes at bedtime.    . Melatonin 1 MG TABS Take by mouth at bedtime.    . Multiple Vitamins-Minerals (MULTIVITAMIN WITH MINERALS) tablet Take 1 tablet by mouth daily.    . pantoprazole (PROTONIX) 40 MG tablet TAKE 1 TABLET TWICE DAILY. [place]  tablet 2  . saccharomyces boulardii (FLORASTOR) 250 MG capsule Take 250 mg by mouth. Take one daily    . sodium chloride (OCEAN) 0.65 % SOLN nasal spray Place 1 spray into both nostrils. One spray both nostrils as directed    . sulfamethoxazole-trimethoprim (BACTRIM DS,SEPTRA DS) 800-160 MG per tablet Take 1 tablet by mouth every Monday, Wednesday, and Friday. HOLD WHILE ON AUGMENTIN, THEN RESUME AS PREVIOUS  1  . timolol (BETIMOL) 0.5 % ophthalmic solution Place 1 drop into the left eye daily with breakfast.     . warfarin (COUMADIN) 3 MG tablet Take one tablet by mouth on Mon,Wed,Thurs,Fri,Sat,Sun and Two tablets by mouth on Tuesday only. 38 tablet 5   No current facility-administered medications for this visit.    Allergies:   Adhesive    Social History:  The patient  reports that she  quit smoking about 30 years ago. Her smoking use included Cigarettes. She has a 45 pack-year smoking history. She has never used smokeless tobacco. She reports that she drinks alcohol. She reports that she does not use illicit drugs.   Family History:  The patient's family history includes Alzheimer's disease in her sister; CVA in her father; Congestive Heart Failure in her mother; Dementia in her sister; Heart disease in her father and mother; Lymphoma in her sister; Osteoarthritis in her sister; Skin cancer in her sister; Uterine cancer in her sister.    ROS:  Please see the history of present illness.     All other systems are reviewed and negative.    PHYSICAL EXAM: VS:  BP 124/64 mmHg  Pulse 83  Ht 5\' 1"  (1.549 m)  Wt 128 lb (58.06 kg)  BMI 24.20 kg/m2  SpO2 96% , BMI Body mass index is 24.2 kg/(m^2). GEN: Well nourished, well developed, in no acute distress HEENT: normal Neck: no JVD, carotid bruits, or masses Cardiac: RRR; no murmurs, rubs, or gallops,no edema  Respiratory:  clear to auscultation bilaterally, normal work of breathing GI: soft, nontender, nondistended, + BS MS: no deformity or atrophy Skin: warm and dry, no rash Neuro:  Strength and sensation are intact Psych: euthymic mood, full affect   EKG:  EKG in 08/2014 shows ST, otherwise normal.  Recent Labs: 07/28/2014: Magnesium 2.1 08/01/2014: ALT 31 08/14/2014: B Natriuretic Peptide 216.7* 08/16/2014: BUN 27*; Creatinine, Ser 0.94; Potassium 3.7; Sodium 140 02/03/2015: HGB 12.3; Platelets 302    Lipid Panel No results found for: CHOL, TRIG, HDL, CHOLHDL, VLDL, LDLCALC, LDLDIRECT    Wt Readings from Last 3 Encounters:  02/22/15 128 lb (58.06 kg)  02/03/15 129 lb (58.514 kg)  01/21/15 127 lb (57.607 kg)      ASSESSMENT AND PLAN:  1. Episodic LE edema - currently euvolemic -  We will order echocardiogram.  2. Abnormal ECG on admission in 08/2014 - consistent with sinus tachycardia, not SVT, in the settings  of dehydration.   Follow up in 6 months.  Signed, Dorothy Spark, MD  02/22/2015 4:08 PM    [facility] [address] [place] [address], [place], [place]  [address] Phone: [phone]; Fax: [phone]

## 2015-02-25 ENCOUNTER — Ambulatory Visit (HOSPITAL_COMMUNITY): Payer: Medicare Other | Attending: Cardiology

## 2015-02-25 ENCOUNTER — Other Ambulatory Visit: Payer: Self-pay

## 2015-02-25 DIAGNOSIS — I313 Pericardial effusion (noninflammatory): Secondary | ICD-10-CM | POA: Insufficient documentation

## 2015-02-25 DIAGNOSIS — I509 Heart failure, unspecified: Secondary | ICD-10-CM | POA: Diagnosis present

## 2015-02-25 DIAGNOSIS — I5189 Other ill-defined heart diseases: Secondary | ICD-10-CM | POA: Diagnosis not present

## 2015-02-25 DIAGNOSIS — I5022 Chronic systolic (congestive) heart failure: Secondary | ICD-10-CM | POA: Insufficient documentation

## 2015-02-25 DIAGNOSIS — I071 Rheumatic tricuspid insufficiency: Secondary | ICD-10-CM | POA: Insufficient documentation

## 2015-02-25 DIAGNOSIS — I340 Nonrheumatic mitral (valve) insufficiency: Secondary | ICD-10-CM | POA: Diagnosis not present

## 2015-02-26 ENCOUNTER — Telehealth: Payer: Self-pay

## 2015-02-26 ENCOUNTER — Telehealth: Payer: Self-pay | Admitting: *Deleted

## 2015-02-26 LAB — POCT INR: INR: 2.9 — AB (ref <=1.1)

## 2015-02-26 MED ORDER — FUROSEMIDE 20 MG PO TABS: 20.0000 mg | ORAL_TABLET | ORAL | Status: DC | PRN

## 2015-02-26 NOTE — Telephone Encounter (Signed)
Notified the pt to inform her that per Dr Meda Coffee her echo shows normal LVEF, but grade 2 diastolic dysfunction (stiff heart), and she recommends the pt to take lasix 20 mg po daily only as needed for weight increase by 3 lbs overnight, 5 lbs in a week, or if she develops SOB or LEE.  Confirmed the pharmacy of choice with the pt.  Pt verbalized understanding and agrees with this plan.

## 2015-02-26 NOTE — Telephone Encounter (Signed)
Fax received from [facility] @ [place].  Current PT/INR 2.6  Current dose: 3 mg daily EXCEPT 6 mg on Tuesday Previous PT/INR 3.6  Please advise

## 2015-02-26 NOTE — Telephone Encounter (Signed)
-----   Message from Dorothy Spark, MD sent at 02/25/2015 11:30 PM EDT ----- She has normal LVEF, but grade 2 diastolic dysfunction (stiff heart), we should prescribe laaix 20 mg po daily PRN to be taken when her weight increases by 3 lbs overnight, 5 lbs in a week, she develops SOB or LE edema.

## 2015-02-26 NOTE — Telephone Encounter (Signed)
Already addressed. Continue current dose. Reck on 9/1st

## 2015-02-26 NOTE — Telephone Encounter (Signed)
Left message on machine with instructions for patient. Instructions were also faxed back to [facility]

## 2015-03-01 ENCOUNTER — Encounter: Payer: Self-pay | Admitting: *Deleted

## 2015-03-01 ENCOUNTER — Encounter: Payer: Self-pay | Admitting: Pulmonary Disease

## 2015-03-01 ENCOUNTER — Other Ambulatory Visit (HOSPITAL_COMMUNITY): Payer: Medicare Other

## 2015-03-01 ENCOUNTER — Ambulatory Visit (INDEPENDENT_AMBULATORY_CARE_PROVIDER_SITE_OTHER): Payer: Medicare Other | Admitting: Pulmonary Disease

## 2015-03-01 VITALS — BP 110/68 | HR 90 | Ht 61.0 in | Wt 128.6 lb

## 2015-03-01 DIAGNOSIS — A319 Mycobacterial infection, unspecified: Secondary | ICD-10-CM | POA: Diagnosis not present

## 2015-03-01 DIAGNOSIS — R918 Other nonspecific abnormal finding of lung field: Secondary | ICD-10-CM | POA: Diagnosis not present

## 2015-03-01 DIAGNOSIS — J4520 Mild intermittent asthma, uncomplicated: Secondary | ICD-10-CM

## 2015-03-01 DIAGNOSIS — M3130 Wegener's granulomatosis without renal involvement: Secondary | ICD-10-CM

## 2015-03-01 NOTE — Telephone Encounter (Signed)
error 

## 2015-03-01 NOTE — Progress Notes (Signed)
Subjective:    Patient ID: [name], female    DOB: August 22, 1932, 81 y.o.   MRN: [id number]  Synopsis: [name] has asthma with normal pulmonary function testing. She also has a history of Wegener's granulomatosis and has been treated with high-dose immunosuppressants for many years. She has significant sinus disease related to this and she has lower airway obstruction which has been treated with balloon dilation in the past.  HPI Chief Complaint  Patient presents with  . Follow-up    Former [place] pt. Breathing unchanged, prod cough (yellow phlem). She is coughing up blood 2-3 times at night, nasal cong, PND.    [name] says she thinks she is doing well right now.  She says that she has a long list of instructions from her son-in-law and has been really strict with taking her saline rinses recently.  She doesn't feel "poorly" but she notes feelin tired and she wants to sleep all the time.  She says that in general she is doing well from a breathing standpoint thinks are good.  She continues to see Dr. Lucia Gaskins with ENT.  She has epistaxis every night and coughs up blood.  However she says that she has not coughed up blood from her lungs, only blood from her epistaxis.  She denies heart problems.  She saw her cardiologist this week.  She was told that she could not survive anesthesia for a cochlear implant.  She takes Advair morning and evening.  Past Medical History  Diagnosis Date  . Wegener's granulomatosis   . OA (osteoarthritis)   . Blindness of one eye   . Hearing loss in left ear     hearing aid both ears  . Bladder incontinence   . Glaucoma   . Clotting disorder   . History of pulmonary embolism 1997  . Shortness of breath dyspnea   . Pneumonia 12/15  . Multiple allergies   . Chronic sinusitis   . Acute respiratory failure with hypoxia   . Interstitial emphysema   . Aspergillosis       Review of Systems  Constitutional: Positive for fatigue. Negative for fever.  HENT:  Positive for postnasal drip, rhinorrhea and sinus pressure.   Respiratory: Positive for cough. Negative for shortness of breath and wheezing.   Cardiovascular: Negative for chest pain, palpitations and leg swelling.       Objective:   Physical Exam  Filed Vitals:   03/01/15 1359  BP: 110/68  Pulse: 90  Height: 5\' 1"  (1.549 m)  Weight: 128 lb 9.6 oz (58.333 kg)  SpO2: 97%   RA  Gen: well appearing, no acute distress HENT: NCAT, OP clear, neck supple without masses Eyes: PERRL, EOMi Lymph: no cervical lymphadenopathy PULM: few wheezes on R CV: RRR, no mgr, no JVD GI: BS+, soft, nontender, no hsm Derm: no rash or skin breakdown MSK: normal bulk and tone Neuro: A&Ox4, CN II-XII intact, strength 5/5 in all 4 extremities Psyche: normal mood and affect   The last note from [name] was reviewed where she was followed for pulmonary nodules, asthma, and atypical mycobacterial disease May 2016 chest x-ray images personally reviewed with mild nodules in the bases of the lungs    Assessment & Plan:  [facility] GRANULOMATOSIS This has been a problem for her throughout most of her late adult life, but right now it does not appear to be active. She is managing her chronic symptoms from this well with saline rinses and instructions as directed  by Dr. Radene Journey with ear nose and throat.  Plan: Continue sinus toilet  Intrinsic asthma It is difficult to know if she has asthma or if the wheezing she has from time to time is just stenosis from her Wegener's. She has been treated with balloon dilation in the past. Today on exam she sounds much better than she did when she was hospitalized earlier this year.  Notably, she has essentially normal lung function without significant airflow obstruction. Her FEV1 is greater than 97% predicted. Because of this and the fact that she was able to be successfully extubated while doing quite poorly from a respiratory standpoint makes me question the  recent judgment that she would be intolerant of general anesthesia. As long as there is no active cardiac issue I see no reason why she could not have surgery as she is considering having a cochlear implant.  Plan: Continue Advair I'm happy to talk to your nose and throat were anesthesia if needed in regards to her recent hospitalization (2016) during which time she was mechanically ventilated and successfully extubated  Atypical mycobacterial disease She has no evidence of disease activity right now, so there is no indication for treatment.  Pulmonary nodules This point it seems that the nodule seen on her CT chest in January 2016 were stable and consistent with her known diagnosis of Wegener's. We will plan on repeating a CT chest in 2017.    Current outpatient prescriptions:  .  acetaminophen (TYLENOL) 325 MG tablet, Take 2 tablets (650 mg total) by mouth every 6 (six) hours as needed for mild pain (or Fever >/= 101)., Disp: 30 tablet, Rfl: 2 .  b complex vitamins capsule, Take 1 capsule by mouth daily., Disp: , Rfl:  .  calcium-vitamin D (OSCAL WITH D) 500-200 MG-UNIT per tablet, Take 1 tablet by mouth daily with breakfast., Disp: , Rfl:  .  cholecalciferol (VITAMIN D) 1000 UNITS tablet, One daily for vitamin D supplement, Disp: 30 tablet, Rfl: 11 .  fluticasone (FLONASE) 50 MCG/ACT nasal spray, Place 1 spray into both nostrils daily. , Disp: , Rfl: 5 .  Fluticasone-Salmeterol (ADVAIR DISKUS) 500-50 MCG/DOSE AEPB, INHALE 1 PUFF EVERY 12 HOURS., Disp: 180 each, Rfl: 1 .  furosemide (LASIX) 20 MG tablet, Take 1 tablet (20 mg total) by mouth as needed (when weight increases 3 lbs overnight, 5 lbs in a week, or develops sob or LE edema)., Disp: 90 tablet, Rfl: 3 .  guaiFENesin (MUCINEX) 600 MG 12 hr tablet, Take 600 mg by mouth 2 (two) times daily., Disp: , Rfl:  .  latanoprost (XALATAN) 0.005 % ophthalmic solution, Place 1 drop into both eyes at bedtime., Disp: , Rfl:  .  Melatonin 1 MG  TABS, Take by mouth at bedtime., Disp: , Rfl:  .  Multiple Vitamins-Minerals (MULTIVITAMIN WITH MINERALS) tablet, Take 1 tablet by mouth daily., Disp: , Rfl:  .  pantoprazole (PROTONIX) 40 MG tablet, TAKE 1 TABLET TWICE DAILY., Disp: 60 tablet, Rfl: 2 .  saccharomyces boulardii (FLORASTOR) 250 MG capsule, Take 250 mg by mouth. Take one daily, Disp: , Rfl:  .  sodium chloride (OCEAN) 0.65 % SOLN nasal spray, Place 1 spray into both nostrils. One spray both nostrils as directed, Disp: , Rfl:  .  sulfamethoxazole-trimethoprim (BACTRIM DS,SEPTRA DS) 800-160 MG per tablet, Take 1 tablet by mouth every Monday, Wednesday, and Friday. HOLD WHILE ON AUGMENTIN, THEN RESUME AS PREVIOUS, Disp: , Rfl: 1 .  timolol (BETIMOL) 0.5 % ophthalmic solution, Place  1 drop into the left eye daily with breakfast. , Disp: , Rfl:  .  warfarin (COUMADIN) 3 MG tablet, Take one tablet by mouth on Mon,Wed,Thurs,Fri,Sat,Sun and Two tablets by mouth on Tuesday only., Disp: 38 tablet, Rfl: 5

## 2015-03-01 NOTE — Assessment & Plan Note (Signed)
She has no evidence of disease activity right now, so there is no indication for treatment.

## 2015-03-01 NOTE — Patient Instructions (Signed)
From my standpoint you could have cochlear implant surgery Keep taking Advair every day twice a day Keep using your saline rinses as instructed by Dr. Lucia Gaskins We will see you back in 4 months or sooner if needed

## 2015-03-01 NOTE — Assessment & Plan Note (Signed)
This has been a problem for her throughout most of her late adult life, but right now it does not appear to be active. She is managing her chronic symptoms from this well with saline rinses and instructions as directed by Dr. Radene Journey with ear nose and throat.  Plan: Continue sinus toilet

## 2015-03-01 NOTE — Assessment & Plan Note (Signed)
This point it seems that the nodule seen on her CT chest in January 2016 were stable and consistent with her known diagnosis of Wegener's. We will plan on repeating a CT chest in 2017.

## 2015-03-01 NOTE — Assessment & Plan Note (Signed)
It is difficult to know if she has asthma or if the wheezing she has from time to time is just stenosis from her Wegener's. She has been treated with balloon dilation in the past. Today on exam she sounds much better than she did when she was hospitalized earlier this year.  Notably, she has essentially normal lung function without significant airflow obstruction. Her FEV1 is greater than 97% predicted. Because of this and the fact that she was able to be successfully extubated while doing quite poorly from a respiratory standpoint makes me question the recent judgment that she would be intolerant of general anesthesia. As long as there is no active cardiac issue I see no reason why she could not have surgery as she is considering having a cochlear implant.  Plan: Continue Advair I'm happy to talk to your nose and throat were anesthesia if needed in regards to her recent hospitalization (2016) during which time she was mechanically ventilated and successfully extubated

## 2015-03-01 NOTE — Telephone Encounter (Signed)
Per Dr. Reed--No change in Coumadin dose. Recheck 03/04/2015 Faxed to [facility] attn: [name] and patient notified by Charae.

## 2015-03-02 ENCOUNTER — Other Ambulatory Visit: Payer: Self-pay | Admitting: Internal Medicine

## 2015-03-04 ENCOUNTER — Telehealth: Payer: Self-pay

## 2015-03-04 LAB — POCT INR: INR: 4.1 — AB (ref 0.9–1.1)

## 2015-03-04 NOTE — Telephone Encounter (Signed)
Received fax from [facility] today's INR 4.1, on 3mg  on M,W,Th,F,Sat,Sun, except 6mg  on Tuesday. Previous INR 2.9 on 02/26/15.   Per Sherrie Mustache, NP hold Coumadin today, repeat INR tomorrow Friday 03/05/15. Called patient and [name], faxed order to [facility].

## 2015-03-05 ENCOUNTER — Telehealth: Payer: Self-pay

## 2015-03-05 LAB — POCT INR: INR: 3.3 — AB (ref 0.9–1.1)

## 2015-03-05 NOTE — Telephone Encounter (Signed)
Hold coumadin x 2 days then start Coumadin 2mg  #30 take 1 po qPM  with 3RF. Recheck INR 9/7th

## 2015-03-05 NOTE — Telephone Encounter (Signed)
Called patient gave new instructions, hold Coumadin today (Friday and Saturday) start on Sunday 2mg  (patient has 4 mg , take 1/2 tablet Sun, Mon, Tue) Repeat INR Wed 9/7. Patient wrote instructions down and repeated back to me. Fax back to [facility].

## 2015-03-05 NOTE — Telephone Encounter (Signed)
Dr.Green when reviewing this patient's PT/INR I see that her diagnosis is history of PE. PE occurred in 1997. Is this patient suppose to be on coumadin as a lifetime mediations?   Please advise

## 2015-03-05 NOTE — Telephone Encounter (Signed)
I should discuss this with her at her next visit 

## 2015-03-05 NOTE — Telephone Encounter (Signed)
Manual fax received from [facility] @ [place].  Current PT/INR 3.3  Previous PT/INR was 4.1 on 03/04/15, patient was instructed to HOLD medication.   Patient was previously taking: 3mg  on M,W,Th,F,Sat,Sun, except 6mg  on Tuesday, PRIOR to being told to hold. Please advise

## 2015-03-05 NOTE — Telephone Encounter (Signed)
Noted  

## 2015-03-10 ENCOUNTER — Telehealth: Payer: Self-pay

## 2015-03-10 LAB — POCT INR: INR: 1.4 — AB (ref <=1.1)

## 2015-03-10 NOTE — Telephone Encounter (Signed)
Current PT/INR 1.4,  Medication was HELD x 2 days 03/05/15 and 03/06/15. Patient resumed 2 mg once daily as of 03/07/15.   Previous INR 3.3   Please advise

## 2015-03-11 ENCOUNTER — Non-Acute Institutional Stay: Payer: Medicare Other | Admitting: Nurse Practitioner

## 2015-03-11 ENCOUNTER — Encounter: Payer: Self-pay | Admitting: Nurse Practitioner

## 2015-03-11 VITALS — BP 134/62 | HR 88 | Temp 97.8°F | Wt 129.0 lb

## 2015-03-11 DIAGNOSIS — K219 Gastro-esophageal reflux disease without esophagitis: Secondary | ICD-10-CM | POA: Diagnosis not present

## 2015-03-11 DIAGNOSIS — M79671 Pain in right foot: Secondary | ICD-10-CM

## 2015-03-11 DIAGNOSIS — J4520 Mild intermittent asthma, uncomplicated: Secondary | ICD-10-CM

## 2015-03-11 DIAGNOSIS — Z86711 Personal history of pulmonary embolism: Secondary | ICD-10-CM

## 2015-03-11 DIAGNOSIS — F411 Generalized anxiety disorder: Secondary | ICD-10-CM | POA: Diagnosis not present

## 2015-03-11 NOTE — Telephone Encounter (Signed)
Please advise which day PT/INR to be checked

## 2015-03-11 NOTE — Telephone Encounter (Signed)
Phone note was faxed to [facility]

## 2015-03-11 NOTE — Assessment & Plan Note (Signed)
Continue Coumadin, goal of INR 2-3  

## 2015-03-11 NOTE — Assessment & Plan Note (Addendum)
03/09/15 Xray R foot 3 views: no fracture osseous pathology. 03/11/15 seen in clinic, mild erythema, swelling, pain with weight bearing, likely traumatic etiology, will apply Voltaren get 4x/day, PT to eval and tx.

## 2015-03-11 NOTE — Assessment & Plan Note (Signed)
Stable, continue Pantoprazole 40mg daily.   

## 2015-03-11 NOTE — Telephone Encounter (Signed)
INR to be checked whenever the routine lab today is. I think Tuesday would be appropriate.

## 2015-03-11 NOTE — Telephone Encounter (Signed)
Left message on voicemail for patient with instructions

## 2015-03-11 NOTE — Addendum Note (Signed)
Addended by: Logan Bores on: 03/11/2015 04:02 PM   Modules accepted: Medications

## 2015-03-11 NOTE — Telephone Encounter (Signed)
Given to Dr. Mariea Clonts to review and sign.

## 2015-03-11 NOTE — Progress Notes (Signed)
Patient ID: [name], female   DOB: 11/29/1932, 81 y.o.   MRN: [id number]  Location:  clinic [place] Provider:  Marlana Latus NP  Code Status:  DNR Goals of care: Advanced Directive information    Chief Complaint  Patient presents with  . pain    both feet, right foot worse, can't walk on (in wheelchair)      HPI: Patient is a 81 y.o. female seen in the clinic at [facility] today for evaluation of right mid foot pain when walking, X-ray ruled out fx, mild swelling noted, but no heat or bruise, it has been better since she is admitted to [place] where she is able to rest more than [place] apartment.   Review of Systems:  Review of Systems  Constitutional: Negative for fever, chills and diaphoresis.       Frail and generally weak.  HENT: Positive for hearing loss (Deaf on left, hearing aid on right ). Negative for congestion, ear discharge, ear pain and tinnitus.   Eyes: Negative.  Negative for pain and redness.  Respiratory: Positive for cough and shortness of breath. Negative for wheezing (Intermittent).        History Wegener's granulomatosis. History of chronic sinusitis related to her Wegener's.  Cardiovascular: Positive for leg swelling. Negative for chest pain and palpitations.       Trace R+L foot/ankle, R>L  Gastrointestinal: Negative for nausea, abdominal pain, diarrhea and constipation.       History of GERD.  Genitourinary: Positive for frequency. Negative for dysuria, urgency, hematuria and flank pain.       Frequency induced by furosemide. Episodes of incontinence of urine.  Musculoskeletal: Negative for myalgias, back pain and neck pain.       Generalized weakness Right foot pain, worsens with walking  Skin: Negative.  Negative for rash.  Neurological: Negative for dizziness, tremors, seizures, weakness and headaches.  Endo/Heme/Allergies: Negative for polydipsia. Does not bruise/bleed easily.       Chronic anemia.  Psychiatric/Behavioral: Negative for suicidal  ideas and hallucinations. The patient is nervous/anxious.        Recently her husband has been admitted to SNF    Past Medical History  Diagnosis Date  . Wegener's granulomatosis   . OA (osteoarthritis)   . Blindness of one eye   . Hearing loss in left ear     hearing aid both ears  . Bladder incontinence   . Glaucoma   . Clotting disorder   . History of pulmonary embolism 1997  . Shortness of breath dyspnea   . Pneumonia 12/15  . Multiple allergies   . Chronic sinusitis   . Acute respiratory failure with hypoxia   . Interstitial emphysema   . Aspergillosis     Patient Active Problem List   Diagnosis Date Noted  . Right foot pain 03/11/2015  . Ankle ulcer 12/22/2014  . Generalized anxiety disorder 09/10/2014  . Callus of foot 08/19/2014  . Chronic sinusitis 08/18/2014  . AKI (acute kidney injury) 08/15/2014  . Pulmonary nodules 08/13/2014  . Atypical mycobacterial disease 08/13/2014  . Edema 08/10/2014  . History of aspergillosis 08/10/2014  . Deaf 08/10/2014  . Urine incontinence 08/10/2014  . Weak 08/10/2014  . Abnormality of gait 08/10/2014  . Scoliosis (and kyphoscoliosis), idiopathic 08/10/2014  . Long term current use of anticoagulant therapy 08/10/2014  . Cardiomegaly 08/10/2014  . Hoarse 08/10/2014  . Vitamin D deficiency 08/10/2014  . Anemia of chronic disease 08/03/2014  . GERD (gastroesophageal reflux  disease) 08/03/2014  . Constipation 08/03/2014  . Personal history of PE (pulmonary embolism) 08/03/2014  . Dyspnea 07/20/2014  . Left inguinal hernia 11/05/2013  . WEGENERS GRANULOMATOSIS 05/22/2007  . Intrinsic asthma 05/22/2007  . Sinusitis, chronic 05/21/2007    Allergies  Allergen Reactions  . Adhesive [Tape] Rash    Medications: Patient's Medications  New Prescriptions   No medications on file  Previous Medications   ACETAMINOPHEN (TYLENOL) 325 MG TABLET    Take 2 tablets (650 mg total) by mouth every 6 (six) hours as needed for mild  pain (or Fever >/= 101).   B COMPLEX VITAMINS CAPSULE    Take 1 capsule by mouth daily.   CALCIUM-VITAMIN D (OSCAL WITH D) 500-200 MG-UNIT PER TABLET    Take 1 tablet by mouth daily with breakfast.   CHOLECALCIFEROL (VITAMIN D) 1000 UNITS TABLET    One daily for vitamin D supplement   FLUTICASONE (FLONASE) 50 MCG/ACT NASAL SPRAY    Place 1 spray into both nostrils daily.    FLUTICASONE-SALMETEROL (ADVAIR DISKUS) 500-50 MCG/DOSE AEPB    INHALE 1 PUFF EVERY 12 HOURS.   FUROSEMIDE (LASIX) 20 MG TABLET    Take 1 tablet (20 mg total) by mouth as needed (when weight increases 3 lbs overnight, 5 lbs in a week, or develops sob or LE edema).   GUAIFENESIN (MUCINEX) 600 MG 12 HR TABLET    Take 600 mg by mouth 2 (two) times daily.   LATANOPROST (XALATAN) 0.005 % OPHTHALMIC SOLUTION    Place 1 drop into both eyes at bedtime.   MELATONIN 1 MG TABS    Take by mouth at bedtime.   MULTIPLE VITAMINS-MINERALS (MULTIVITAMIN WITH MINERALS) TABLET    Take 1 tablet by mouth daily.   PANTOPRAZOLE (PROTONIX) 40 MG TABLET    TAKE 1 TABLET TWICE DAILY.   SACCHAROMYCES BOULARDII (FLORASTOR) 250 MG CAPSULE    Take 250 mg by mouth. Take one daily   SODIUM CHLORIDE (OCEAN) 0.65 % SOLN NASAL SPRAY    Place 1 spray into both nostrils. One spray both nostrils as directed   SULFAMETHOXAZOLE-TRIMETHOPRIM (BACTRIM DS,SEPTRA DS) 800-160 MG PER TABLET    Take 1 tablet by mouth every Monday, Wednesday, and Friday. HOLD WHILE ON AUGMENTIN, THEN RESUME AS PREVIOUS   TIMOLOL (BETIMOL) 0.5 % OPHTHALMIC SOLUTION    Place 1 drop into the left eye daily with breakfast.    WARFARIN (COUMADIN) 2 MG TABLET    Take 2 mg by mouth daily. Recheck PT/INR on 03/16/15  Modified Medications   No medications on file  Discontinued Medications   No medications on file    Physical Exam: Filed Vitals:   03/11/15 1444  BP: 134/62  Pulse: 88  Temp: 97.8 F (36.6 C)  TempSrc: Oral  Weight: 129 lb (58.514 kg)  SpO2: 90%   Body mass index is 24.39  kg/(m^2).  Physical Exam  Constitutional: She is oriented to person, place, and time. She appears well-developed. No distress.  Thin and frail. Weight stable  since 3/16  HENT:  Head: Normocephalic and atraumatic.  Nose: Nose normal.  Mouth/Throat: Oropharynx is clear and moist. No oropharyngeal exudate.  Bilateral hearing loss. Hearing aid - right ear  Eyes: Conjunctivae and EOM are normal. Pupils are equal, round, and reactive to light. Right eye exhibits no discharge. Left eye exhibits no discharge. No scleral icterus.  Neck: Normal range of motion. Neck supple. No JVD present. No tracheal deviation present. No thyromegaly present.  Cardiovascular: Normal  rate, regular rhythm, normal heart sounds and intact distal pulses.   No murmur heard. Pulmonary/Chest: Effort normal. No stridor. No respiratory distress. She has no wheezes. She has no rales. She exhibits no tenderness.  Voice hoarse  Abdominal: Soft. Bowel sounds are normal. She exhibits no distension and no mass. There is no tenderness. There is no rebound and no guarding.  Musculoskeletal: Normal range of motion. She exhibits edema (trace). She exhibits no tenderness.  R+L foot/ankle, R>L  Lymphadenopathy:    She has no cervical adenopathy.  Neurological: She is alert and oriented to person, place, and time. She has normal reflexes. She displays normal reflexes. No cranial nerve deficit. She exhibits normal muscle tone. Coordination normal.  Skin: Skin is warm and dry. No rash noted. She is not diaphoretic. No erythema. No pallor.  Corns on the left foot at the tip of the third toe and at the fifth metacarpal phalangeal joint slightly lateral.   Psychiatric: She has a normal mood and affect. Her behavior is normal. Judgment and thought content normal.  Emotional when voice her worry not able to walk to SNF to visit her busband.     Labs reviewed: Basic Metabolic Panel:  Recent Labs  07/20/14 1713  07/28/14 0545   08/14/14 2105 08/15/14 0537 08/16/14 0341  NA  --   < > 141  < > 137 139 140  K  --   < > 3.6  < > 4.3 3.5 3.7  CL  --   < > 108  --  101 103 105  CO2  --   < > 28  --   --  28 25  GLUCOSE  --   < > 97  --  263* 95 88  BUN  --   < > 23  < > 36* 30* 27*  CREATININE  --   < > 0.63  < > 1.40* 1.14* 0.94  CALCIUM  --   < > 8.3*  --   --  8.9 9.6  MG 2.0  --  2.1  --   --   --   --   < > = values in this interval not displayed.  Liver Function Tests:  Recent Labs  07/23/14 0400 07/26/14 0530 07/28/14 0545 08/01/14  AST 36 36 23 22  ALT 23 43* 39* 31  ALKPHOS 97 90 82 90  BILITOT 0.4 0.5 0.6  --   PROT 5.0* 5.0* 4.6*  --   ALBUMIN 2.3* 2.3* 2.2*  --     CBC:  Recent Labs  09/22/14 1049 11/17/14 1840 11/23/14 1126 02/03/15 1335  WBC 6.0 7.5 5.6 5.1  NEUTROABS 4.2  --  3.9 3.4  HGB 12.5 13.6 12.8 12.3  HCT 39.1 42.2 38.5 37.0  MCV 97 95.7 96 96  PLT 242  --  345 302    No results found for: TSH Lab Results  Component Value Date   HGBA1C 5.8* 07/28/2014   No results found for: CHOL, HDL, LDLCALC, LDLDIRECT, TRIG, CHOLHDL  Significant Diagnostic Results since last visit: none  Patient Care Team: Estill Dooms, MD as PCP - General (Internal Medicine) Unice Bailey, MD as Consulting Physician (Rheumatology) Rozetta Nunnery, MD as Consulting Physician (Otolaryngology) Marcial Pacas, MD as Consulting Physician (Neurology) Michel Bickers, MD as Consulting Physician (Infectious Diseases) Volanda Napoleon, MD as Consulting Physician (Oncology) Juanito Doom, MD as Consulting Physician (Pulmonary Disease) Tanda Rockers, MD as Consulting Physician (Pulmonary Disease) Viviann Spare  Nyoka Cowden, MD as Consulting Physician (Cade) Coralie Keens, MD as Consulting Physician (General Surgery) Romello Hoehn X, NP as Nurse Practitioner (Nurse Practitioner)  Assessment/Plan Problem List Items Addressed This Visit    Intrinsic asthma (Chronic)    Felt to have asthma,  and airflow limitation from bronchial stenosis associated with Wegener's (s/p balloon dilatation). Continue Advair, Mucinex, Levalbuterol Neb      Personal history of PE (pulmonary embolism) (Chronic)    Continue Coumadin, goal of INR 2-3      GERD (gastroesophageal reflux disease)    Stable, continue Pantoprazole 40mg  daily.       Generalized anxiety disorder    Takes Melatonin 5mg  to help rest at night. Continue Lorazepam      Right foot pain - Primary    03/09/15 Xray R foot 3 views: no fracture osseous pathology. 03/11/15 seen in clinic, mild erythema, swelling, pain with weight bearing, likely traumatic etiology, will apply Voltaren get 4x/day, PT to eval and tx.           Family/ staff Communication: PT to eval and tx, observe the patient.   Labs/tests ordered:  X-ray R foot done.   [facility] Kayson Tasker NP Geriatrics [facility] [phone] [place], [place] [address] On Call:  [phone] & follow prompts after 5pm & weekends Office Phone:  [phone] Office Fax:  [phone]

## 2015-03-11 NOTE — Telephone Encounter (Signed)
Continue 2 mg warfarin daily. Recheck INR next week.

## 2015-03-11 NOTE — Assessment & Plan Note (Addendum)
Takes Melatonin 5mg  to help rest at night. Continue Lorazepam

## 2015-03-11 NOTE — Assessment & Plan Note (Signed)
Felt to have asthma, and airflow limitation from bronchial stenosis associated with Wegener's (s/p balloon dilatation). Continue Advair, Mucinex, Levalbuterol Neb

## 2015-03-12 ENCOUNTER — Ambulatory Visit (INDEPENDENT_AMBULATORY_CARE_PROVIDER_SITE_OTHER): Payer: Medicare Other | Admitting: Podiatry

## 2015-03-12 ENCOUNTER — Encounter: Payer: Self-pay | Admitting: Podiatry

## 2015-03-12 DIAGNOSIS — L84 Corns and callosities: Secondary | ICD-10-CM | POA: Diagnosis not present

## 2015-03-12 DIAGNOSIS — Q828 Other specified congenital malformations of skin: Secondary | ICD-10-CM | POA: Diagnosis not present

## 2015-03-12 NOTE — Progress Notes (Signed)
Subjective:     Patient ID: [name], female   DOB: 09/17/1932, 82 y.o.   MRN: [id number]  HPIPatient says the corn on her fourth toe has returned and is painful walking and wearing her shoes.  She also has callus under the outer ball of her left foot which is painful walking.  Both of these sites have previouslu ulcerated but now are callused.   Review of Systems     Objective:   Physical Exam Objective: Review of past medical history, medications, social history and allergies were performed.  Vascular: Dorsalis pedis and posterior tibial pulses were not  palpable B/L, capillary refill was  WNL B/L, temperature gradient was WNL B/L   Skin:  Corn fourth toe due to deformed position fourth toe left foot.  Nails: thick disfigured discolored nails both feet with no infection noted.  Sensory: Semmes Weinstein monifilament WNL   Orthopedic: Orthopedic evaluation demonstrates all joints distal t ankle have full ROM without crepitus, muscle power WNL B/L.  Deformed fourth toe left foot.     Assessment:     Onychomycosis  Corn fourth toe left foot  Porokeratosis left foot.     Plan:    Debride nails   Debride porokeratosis       

## 2015-03-15 ENCOUNTER — Other Ambulatory Visit: Payer: Self-pay | Admitting: Otolaryngology

## 2015-03-17 ENCOUNTER — Telehealth: Payer: Self-pay | Admitting: *Deleted

## 2015-03-17 LAB — POCT INR: INR: 1.3 — AB (ref 0.9–1.1)

## 2015-03-17 NOTE — Telephone Encounter (Signed)
Received Protime Results from [facility] for 03/17/2015  INR:  1.3  Current Dose of Coumadin: 3mg  on hold 9/2 & 9/3, then 2mg  daily started on 03/07/2015. Given to Dr. Nyoka Cowden to review and sign.

## 2015-03-18 NOTE — Telephone Encounter (Signed)
Per Dr. Green--3mg  daily and recheck in 1 week. Faxed to [facility] and patient notified.

## 2015-03-19 ENCOUNTER — Encounter: Payer: Self-pay | Admitting: Podiatry

## 2015-03-19 ENCOUNTER — Ambulatory Visit (INDEPENDENT_AMBULATORY_CARE_PROVIDER_SITE_OTHER): Payer: Medicare Other | Admitting: Podiatry

## 2015-03-19 DIAGNOSIS — M722 Plantar fascial fibromatosis: Secondary | ICD-10-CM | POA: Diagnosis not present

## 2015-03-19 MED ORDER — MELOXICAM 7.5 MG PO TABS: 7.5000 mg | ORAL_TABLET | Freq: Every day | ORAL | Status: DC

## 2015-03-20 NOTE — Progress Notes (Signed)
Subjective:     Patient ID: [name], female   DOB: [birth year], 81 y.o.   MRN: [id number]  HPIThis patient presents to office saying her right foot has been very painful and she is unable to bear weight.  She says she is in a wheel [facility] at [facility].  She says she has been to the nurse at the home who recommended heat which she says helps. She had this problem last week which I recommended she pick up OTC insoles which she never did pick up.  She points to the center of her arch right foot.  She desires evaluation and treatment.    Review of Systems     Objective:   Physical Exam Vascular: Dorsalis pedis and posterior tibial pulses were not palpable B/L, capillary refill was WNL    Skin: Corn fourth toe due to deformed position fourth toe left foot is now healed with np ulceration.  Nails: thick disfigured discolored nails both feet with no infection noted.  Sensory: Thornell Mule monifilament WNL   Orthopedic: Orthopedic evaluation demonstrates all joints distal t ankle have full ROM without crepitus, muscle power WNL B/L. Deformed fourth toe left foot.Palpable pain center plantar fascia right foot with swelling noted over metatarsals right foot.  No pain associated with metatarsal shaft.     Assessment:     Plantar fascitis right foot     Plan:     ROV  Had her pick up insoles to wear .  Prescribed Mobic but pharmacy informed me she is on coumadin.  Therefore I told to the pharmacy not to prescribed Mobic.  Watched her walk out of the treatment room with normal gait and in no obvious distress. Told her I may recommend unna boot if the pain persists another week and the swelling on top of her foot persists.

## 2015-03-24 ENCOUNTER — Telehealth: Payer: Self-pay | Admitting: *Deleted

## 2015-03-24 NOTE — Telephone Encounter (Addendum)
03/22/2015 Adrienne - PT at [facility], states pt saw Dr. Prudence Davidson on Friday and was told to get a scooter.  Was the scooter a knee scooter, or motorized chair.  I was unable to understand pt's name and called back and left a message to call again with the pt's name.I spoke with Vincente Liberty, she states Dr. Prudence Davidson told her to get two orthotics, one works well for pt the other not so well, but what about the scooter.  I reviewed pt's chart 03/19/2015 and no reference was made to a scooter, only the possibility of unna boot application at the next visit.  Vincente Liberty states her primary had recommended additional PT, but she will hold off and refer back to Dr. Prudence Davidson.

## 2015-03-24 NOTE — Telephone Encounter (Signed)
Per Dr. Vicente Males current dose of Coumadin and recheck in 2 weeks. Patient notified and agreed. Faxed results back to [name] [phone]

## 2015-03-24 NOTE — Telephone Encounter (Signed)
Patient received INR 03/24/2015, result 2.1. Current coumadin dose is 3 mg daily, result given to Dr. Nyoka Cowden.

## 2015-03-26 ENCOUNTER — Encounter: Payer: Self-pay | Admitting: Podiatry

## 2015-03-26 ENCOUNTER — Ambulatory Visit (INDEPENDENT_AMBULATORY_CARE_PROVIDER_SITE_OTHER): Payer: Medicare Other | Admitting: Podiatry

## 2015-03-26 DIAGNOSIS — M722 Plantar fascial fibromatosis: Secondary | ICD-10-CM

## 2015-03-26 NOTE — Progress Notes (Signed)
Subjective:     Patient ID: [name], female   DOB: Jan 20, 1933, 81 y.o.   MRN: [id number]  HPIThis patient presents to the office saying her feet are better using the insoles but not painfree.  She says her feet still become painful walking distances at the nursing home.  She believes she would benefit from motorized walker to help her get to another building which houses her husband.   Review of Systems     Objective:   Physical Exam. Objective:   Physical Exam Vascular: Dorsalis pedis and posterior tibial pulses were not palpable B/L, capillary refill was WNL    Skin: Corn fourth toe due to deformed position fourth toe left foot is now healed with np ulceration.  Nails: thick disfigured discolored nails both feet with no infection noted.  Sensory: Thornell Mule monifilament WNL   Orthopedic: Orthopedic evaluation demonstrates all joints distal t ankle have full ROM without crepitus, muscle power WNL B/L. Deformed fourth toe left foot.Palpable pain center plantar fascia right foot with swelling noted over metatarsals right foot. No pain associated with metatarsal shaft            Assessment:     Plantar fascitis     Plan:     ROV>  Letter given for motorized chair.  RTC prn

## 2015-04-07 LAB — PROTIME-INR: INR: 2.1 — AB (ref 0.9–1.1)

## 2015-04-08 ENCOUNTER — Telehealth: Payer: Self-pay | Admitting: *Deleted

## 2015-04-08 LAB — POCT INR: INR: 2.1 — AB (ref 0.9–1.1)

## 2015-04-08 NOTE — Telephone Encounter (Signed)
Received Protime Results from [facility] 04/08/2015  INR:2.1  Current Dose of Coumadin: 3mg  once daily. Takes Bactrim DS indefinitely on Mon,Wed,Friday for treatment of Wegener's Disease. Per Dr. Vicente Males current dose of Coumadin and recheck in 1 month. Patient notified and agreed.

## 2015-04-20 ENCOUNTER — Encounter (HOSPITAL_COMMUNITY)
Admission: RE | Admit: 2015-04-20 | Discharge: 2015-04-20 | Disposition: A | Discharge status: Home / Self Care | Payer: Medicare Other | Source: Ambulatory Visit | Attending: Otolaryngology | Admitting: Otolaryngology

## 2015-04-20 ENCOUNTER — Encounter (HOSPITAL_COMMUNITY): Payer: Self-pay

## 2015-04-20 DIAGNOSIS — Z01812 Encounter for preprocedural laboratory examination: Secondary | ICD-10-CM | POA: Diagnosis not present

## 2015-04-20 DIAGNOSIS — Z87891 Personal history of nicotine dependence: Secondary | ICD-10-CM | POA: Insufficient documentation

## 2015-04-20 DIAGNOSIS — M3130 Wegener's granulomatosis without renal involvement: Secondary | ICD-10-CM | POA: Diagnosis not present

## 2015-04-20 DIAGNOSIS — I5030 Unspecified diastolic (congestive) heart failure: Secondary | ICD-10-CM | POA: Insufficient documentation

## 2015-04-20 DIAGNOSIS — H409 Unspecified glaucoma: Secondary | ICD-10-CM | POA: Insufficient documentation

## 2015-04-20 DIAGNOSIS — J439 Emphysema, unspecified: Secondary | ICD-10-CM | POA: Insufficient documentation

## 2015-04-20 DIAGNOSIS — Z86711 Personal history of pulmonary embolism: Secondary | ICD-10-CM | POA: Diagnosis not present

## 2015-04-20 DIAGNOSIS — Z01818 Encounter for other preprocedural examination: Secondary | ICD-10-CM | POA: Insufficient documentation

## 2015-04-20 DIAGNOSIS — Z7901 Long term (current) use of anticoagulants: Secondary | ICD-10-CM | POA: Diagnosis not present

## 2015-04-20 DIAGNOSIS — H7012 Chronic mastoiditis, left ear: Secondary | ICD-10-CM | POA: Diagnosis not present

## 2015-04-20 DIAGNOSIS — Z79899 Other long term (current) drug therapy: Secondary | ICD-10-CM | POA: Diagnosis not present

## 2015-04-20 HISTORY — DX: Malignant (primary) neoplasm, unspecified: C80.1

## 2015-04-20 HISTORY — DX: Cerebral infarction, unspecified: I63.9

## 2015-04-20 HISTORY — DX: Failed or difficult intubation, initial encounter: T88.4XXA

## 2015-04-20 LAB — CBC
HEMATOCRIT: 36.3 % (ref 36.0–46.0)
HEMOGLOBIN: 11.7 g/dL — AB (ref 12.0–15.0)
MCH: 29.3 pg (ref 26.0–34.0)
MCHC: 32.2 g/dL (ref 30.0–36.0)
MCV: 90.8 fL (ref 78.0–100.0)
Platelets: 311 10*3/uL (ref 150–400)
RBC: 4 MIL/uL (ref 3.87–5.11)
RDW: 17 % — ABNORMAL HIGH (ref 11.5–15.5)
WBC: 5.8 10*3/uL (ref 4.0–10.5)

## 2015-04-20 LAB — BASIC METABOLIC PANEL
Anion gap: 6 (ref 5–15)
BUN: 12 mg/dL (ref 6–20)
CHLORIDE: 106 mmol/L (ref 101–111)
CO2: 28 mmol/L (ref 22–32)
Calcium: 10 mg/dL (ref 8.9–10.3)
Creatinine, Ser: 0.79 mg/dL (ref 0.44–1.00)
GFR calc Af Amer: >60 mL/min (ref >60)
GFR calc non Af Amer: >60 mL/min (ref >60)
GLUCOSE: 104 mg/dL — AB (ref 65–99)
POTASSIUM: 4.2 mmol/L (ref 3.5–5.1)
Sodium: 140 mmol/L (ref 135–145)

## 2015-04-20 NOTE — Pre-Procedure Instructions (Signed)
[name]  04/20/2015      [facility] - [place], [place] [address] Phone: [phone] Fax: [phone]    Your procedure is scheduled on  Wednesday  04/28/15  Report to [facility]mitting at 630 A.M.  Call this number if you have problems the morning of surgery:  [phone]   Remember:  Do not eat food or drink liquids after midnight.  Take these medicines the morning of surgery with A SIP OF WATER   Tylenol, advair, eye drops, pantoprazole (protonix) (stop vitamins, meloxicam/ mobic, last dose of coumadin Thursday 04/22/15)   Do not wear jewelry, make-up or nail polish.  Do not wear lotions, powders, or perfumes.  You may wear deodorant.  Do not shave 48 hours prior to surgery.  Men may shave face and neck.  Do not bring valuables to the hospital.  [facility] is not responsible for any belongings or valuables.  Contacts, dentures or bridgework may not be worn into surgery.  Leave your suitcase in the car.  After surgery it may be brought to your room.  For patients admitted to the hospital, discharge time will be determined by your treatment team.  Patients discharged the day of surgery will not be allowed to drive home.   Name and phone number of your driver:   Special instructions:  [name] - Preparing for Surgery  Before surgery, you can play an important role.  Because skin is not sterile, your skin needs to be as free of germs as possible.  You can reduce the number of germs on you skin by washing with CHG (chlorahexidine gluconate) soap before surgery.  CHG is an antiseptic cleaner which kills germs and bonds with the skin to continue killing germs even after washing.  Please DO NOT use if you have an allergy to CHG or antibacterial soaps.  If your skin becomes reddened/irritated stop using the CHG and inform your nurse when you arrive at Short Stay.  Do not shave  (including legs and underarms) for at least 48 hours prior to the first CHG shower.  You may shave your face.  Please follow these instructions carefully:   1.  Shower with CHG Soap the night before surgery and the                                morning of Surgery.  2.  If you choose to wash your hair, wash your hair first as usual with your       normal shampoo.  3.  After you shampoo, rinse your hair and body thoroughly to remove the                      Shampoo.  4.  Use CHG as you would any other liquid soap.  You can apply chg directly       to the skin and wash gently with scrungie or a clean washcloth.  5.  Apply the CHG Soap to your body ONLY FROM THE NECK DOWN.        Do not use on open wounds or open sores.  Avoid contact with your eyes,       ears, mouth and genitals (private parts).  Wash genitals (private parts)       with your normal soap.  6.  Wash thoroughly, paying special attention to the area where your surgery        will be performed.  7.  Thoroughly rinse your body with warm water from the neck down.  8.  DO NOT shower/wash with your normal soap after using and rinsing off       the CHG Soap.  9.  Pat yourself dry with a clean towel.            10.  Wear clean pajamas.            11.  Place clean sheets on your bed the night of your first shower and do not        sleep with pets.  Day of Surgery  Do not apply any lotions/deoderants the morning of surgery.  Please wear clean clothes to the hospital/surgery center.    Please read over the following fact sheets that you were given. Pain Booklet, Coughing and Deep Breathing and Surgical Site Infection Prevention

## 2015-04-20 NOTE — Progress Notes (Addendum)
Spoke with Patricia Pesa, nurse at Dr. Deeann Saint office who stated Dr. Benjamine Mola said patient should stop coumadin Thursday (last dose Thurs  04/22/15).

## 2015-04-21 ENCOUNTER — Encounter (HOSPITAL_COMMUNITY): Payer: Self-pay

## 2015-04-21 NOTE — Progress Notes (Signed)
Anesthesia Chart Review: Patient is a 81 year old female scheduled for left tympanomastoidectomy on 04/28/15 by Dr. Benjamine Mola.  History includes former smoker, Wegener's granulomatosis, glaucoma, blindness in left eye, hearing loss left ear, PE '97 (on warfarin) and s/p IVC filter, Aspergillosis, emphysema, PNA 06/2014, admitted 07/20/14-07/30/14 with HCAP and developed ARF with stridor requiring intubation (had a "thick black fibrinous material hanging from pharynx to below the cords" that seemed to be the primary source of her SOB; underwent bronchoscopy with cultures growing MRSA; seen by PCCM, ENT, ID), diastolic CHF, LLE melanoma excision, left IHR '15. She was noted to be a DIFFICULT INTUBATION on 07/22/14 (see below).   PCP is Dr. Jeanmarie Hubert with [facility].  Cardiologist is Dr. Meda Coffee, last visit 02/22/15 for CHF with six month follow-up recommneded.  07/22/14 Anesthesia Record:  Intubation Type: IV induction Ventilation: Mask ventilation without difficulty Laryngoscope Size: 3 (Glidescope 3) Grade View: Grade I Tube type: Subglottic suction tube Tube size: 7.0 mm Number of attempts: 3 (First attempt by CRNA - full view but unable to pass tube. Patient ventilated between attempt - 2nd attempt by MDA - unsuccessful. Dr. Wilnette Kales in and passed tube.) Airway Equipment and Method: Video-laryngoscopy Placement Confirmation: ETT inserted through vocal cords under direct vision, CO2 detector and breath sounds checked- equal and bilateral Tube secured with: Tape Dental Injury: Teeth and Oropharynx as per pre-operative assessment  Difficulty Due To: Difficult Airway- due to anterior larynx Future Recommendations: Recommend- induction with short-acting agent, and alternative techniques readily available.   Meds include Bactrim DS MWF for Wegener's, Flonase, Advair, Lasix, Mucinex, Xalatan, melatonin, Protonix, Florastor, Betimol, warfarin (holding for surgery starting 04/22/15).    Pulmonologist is Dr. Lake Bells, last visti 03/01/15. In this note he discusses that 1) a lung nodule on CT is consistent with her known diagnosis of Wegener's with plans to repeat a CT in 2017, 2) her Wegener's did not appear to be active at that time and was doing well with saline rinses per ENT, 3) she has no evidence of active atypical mycobacterial disease right now and therefore there was no indication for treatment, 4) wheezing could be from astham or stenosis from her Wegener's which had been treated with balloon dilation in the past (she sounded "much better" on exam at that time). His note also states, "Notably, she has essentially normal lung function without significant airflow obstruction. Her FEV1 is greater than 97% predicted. Because of this and the fact that she was able to be successfully extubated while doing quite poorly from a respiratory standpoint makes me question the recent judgment that she would be intolerant of general anesthesia. As long as there is no active cardiac issue I see no reason why she could not have surgery as she is considering having a cochlear implant."  08/14/14 EKG: EKG reviewed by Dr. Meda Coffee at her 02/22/15 visit. She wrote, "Abnormal ECG on admission in 08/2014 - consistent with sinus tachycardia, not SVT, in the settings of dehydration." She did not repeat her EKG in August. HR at PAT was documented at 100 bpm. Will plan to recheck an EKG on arrival to verify that she is staying in a sinus rhythm.  02/25/15 Echo: Study Conclusions - Left ventricle: The cavity size was normal. Wall thickness was normal. Systolic function was normal. The estimated ejection fraction was in the range of 55% to 60%. Wall motion was normal; there were no regional wall motion abnormalities. Features are consistent with a pseudonormal left ventricular filling pattern,  with concomitant abnormal relaxation and increased filling pressure (grade 2 diastolic dysfunction). Doppler parameters are  consistent with high ventricular filling pressure. - Mitral valve: Calcified annulus. There was mild regurgitation. Valve area by continuity equation (using LVOT flow): 2.73 cm^2. - Tricuspid valve: There was moderate regurgitation. - Pericardium, extracardiac: A trivial pericardial effusion was identified. Impressions: Normal LV systolic function; grade 2 diastolic dysfunction with elevated LV filling pressure; mild MR; moderate TR.  08/15/13 PFTs: FVC 2.34 (101%), FEV1 1.66 (97%), DLCOund 13.95 (68%). Spirometry showed no obstruction and no response to BD. Lung volumes showed no significant restriction. DLCO was minimally reduced.  11/17/14 CXR: FINDINGS: Chronic lung disease noted with areas of interstitial and bronchial prominence as well as scattered areas of scarring bilaterally. There is no evidence of pulmonary edema, consolidation, pneumothorax, nodule or pleural fluid. The heart size is within normal limits. The bony thorax is unremarkable. IMPRESSION: Stable chronic lung disease. No active disease.  Preoperative labs noted. She will get a PT/INR on arrival.  Further evaluation of patient and review of PT/INR and EKG results on the day of surgery by her anesthesiologists. She has had recent pulmonary and cardiology evaluations, so if no acute issues then would anticipate that she could proceed as planned.  [facility]/Anesthesiology Phone [phone] 04/21/2015 2:46 PM

## 2015-04-28 ENCOUNTER — Encounter (HOSPITAL_COMMUNITY): Payer: Self-pay | Admitting: Certified Registered Nurse Anesthetist

## 2015-04-28 ENCOUNTER — Ambulatory Visit (HOSPITAL_COMMUNITY)
Admission: RE | Admit: 2015-04-28 | Discharge: 2015-04-29 | Disposition: A | Discharge status: Home / Self Care | Payer: Medicare Other | Source: Ambulatory Visit | Attending: Otolaryngology | Admitting: Otolaryngology

## 2015-04-28 ENCOUNTER — Ambulatory Visit (HOSPITAL_COMMUNITY): Payer: Medicare Other | Admitting: Certified Registered Nurse Anesthetist

## 2015-04-28 ENCOUNTER — Encounter (HOSPITAL_COMMUNITY)
Admission: RE | Disposition: A | Discharge status: Home / Self Care | Payer: Self-pay | Source: Ambulatory Visit | Attending: Otolaryngology

## 2015-04-28 ENCOUNTER — Ambulatory Visit (HOSPITAL_COMMUNITY): Payer: Medicare Other | Admitting: Vascular Surgery

## 2015-04-28 DIAGNOSIS — Z8673 Personal history of transient ischemic attack (TIA), and cerebral infarction without residual deficits: Secondary | ICD-10-CM | POA: Diagnosis not present

## 2015-04-28 DIAGNOSIS — Z7951 Long term (current) use of inhaled steroids: Secondary | ICD-10-CM | POA: Diagnosis not present

## 2015-04-28 DIAGNOSIS — J449 Chronic obstructive pulmonary disease, unspecified: Secondary | ICD-10-CM | POA: Insufficient documentation

## 2015-04-28 DIAGNOSIS — M1990 Unspecified osteoarthritis, unspecified site: Secondary | ICD-10-CM | POA: Diagnosis not present

## 2015-04-28 DIAGNOSIS — H7012 Chronic mastoiditis, left ear: Secondary | ICD-10-CM | POA: Insufficient documentation

## 2015-04-28 DIAGNOSIS — Z79899 Other long term (current) drug therapy: Secondary | ICD-10-CM | POA: Diagnosis not present

## 2015-04-28 DIAGNOSIS — K219 Gastro-esophageal reflux disease without esophagitis: Secondary | ICD-10-CM | POA: Insufficient documentation

## 2015-04-28 DIAGNOSIS — Z87891 Personal history of nicotine dependence: Secondary | ICD-10-CM | POA: Diagnosis not present

## 2015-04-28 DIAGNOSIS — Z9889 Other specified postprocedural states: Secondary | ICD-10-CM

## 2015-04-28 DIAGNOSIS — H905 Unspecified sensorineural hearing loss: Secondary | ICD-10-CM | POA: Diagnosis not present

## 2015-04-28 HISTORY — PX: TYMPANOMASTOIDECTOMY: SHX34

## 2015-04-28 LAB — PROTIME-INR
INR: 1.2 (ref 0.00–1.49)
PROTHROMBIN TIME: 15.4 s — AB (ref 11.6–15.2)

## 2015-04-28 LAB — APTT: aPTT: 39 seconds — ABNORMAL HIGH (ref 24–37)

## 2015-04-28 LAB — SURGICAL PCR SCREEN
MRSA, PCR: NEGATIVE
STAPHYLOCOCCUS AUREUS: NEGATIVE

## 2015-04-28 SURGERY — TYMPANOPLASTY, WITH MASTOIDECTOMY
Anesthesia: General | Laterality: Left

## 2015-04-28 MED ORDER — FENTANYL CITRATE (PF) 250 MCG/5ML IJ SOLN
INTRAMUSCULAR | Status: AC
  Filled 2015-04-28: qty 5

## 2015-04-28 MED ORDER — EPINEPHRINE 30 MG/30ML IJ SOLN
INTRAMUSCULAR | Status: DC | PRN
  Administered 2015-04-28: 30 mL

## 2015-04-28 MED ORDER — STERILE WATER FOR INJECTION IJ SOLN
INTRAMUSCULAR | Status: AC
Start: 2015-04-28 — End: 2015-04-28
  Filled 2015-04-28: qty 10

## 2015-04-28 MED ORDER — CIPROFLOXACIN-DEXAMETHASONE 0.3-0.1 % OT SUSP
OTIC | Status: DC | PRN
  Administered 2015-04-28: 4 [drp] via OTIC

## 2015-04-28 MED ORDER — ONDANSETRON HCL 4 MG/2ML IJ SOLN
INTRAMUSCULAR | Status: DC | PRN
  Administered 2015-04-28: 4 mg via INTRAVENOUS

## 2015-04-28 MED ORDER — ALBUTEROL SULFATE HFA 108 (90 BASE) MCG/ACT IN AERS
INHALATION_SPRAY | RESPIRATORY_TRACT | Status: DC | PRN
  Administered 2015-04-28: 6 via RESPIRATORY_TRACT

## 2015-04-28 MED ORDER — EPHEDRINE SULFATE 50 MG/ML IJ SOLN
INTRAMUSCULAR | Status: AC
  Filled 2015-04-28: qty 1

## 2015-04-28 MED ORDER — SACCHAROMYCES BOULARDII 250 MG PO CAPS
250.0000 mg | ORAL_CAPSULE | Freq: Every evening | ORAL | Status: DC
  Administered 2015-04-28: 250 mg via ORAL
  Filled 2015-04-28: qty 1

## 2015-04-28 MED ORDER — CEFAZOLIN SODIUM-DEXTROSE 2-3 GM-% IV SOLR
INTRAVENOUS | Status: DC | PRN
  Administered 2015-04-28: 2 g via INTRAVENOUS

## 2015-04-28 MED ORDER — OXYCODONE HCL 5 MG PO TABS: 5.0000 mg | ORAL_TABLET | Freq: Once | ORAL | Status: DC | PRN

## 2015-04-28 MED ORDER — TIMOLOL MALEATE 0.5 % OP SOLN: 1.0000 [drp] | Freq: Every day | OPHTHALMIC | Status: DC

## 2015-04-28 MED ORDER — CALCIUM CARBONATE-VITAMIN D 500-200 MG-UNIT PO TABS
1.0000 | ORAL_TABLET | Freq: Every day | ORAL | Status: DC
  Administered 2015-04-28: 1 via ORAL
  Filled 2015-04-28: qty 1

## 2015-04-28 MED ORDER — KCL IN DEXTROSE-NACL 20-5-0.45 MEQ/L-%-% IV SOLN
INTRAVENOUS | Status: DC
  Administered 2015-04-28: 15:00:00 via INTRAVENOUS
  Filled 2015-04-28 (×2): qty 1000

## 2015-04-28 MED ORDER — SUCCINYLCHOLINE CHLORIDE 20 MG/ML IJ SOLN
INTRAMUSCULAR | Status: AC
  Filled 2015-04-28: qty 1

## 2015-04-28 MED ORDER — OXYCODONE-ACETAMINOPHEN 5-325 MG PO TABS
1.0000 | ORAL_TABLET | ORAL | Status: DC | PRN
  Administered 2015-04-28: 1 via ORAL
  Filled 2015-04-28: qty 1

## 2015-04-28 MED ORDER — STERILE WATER FOR INJECTION IJ SOLN
INTRAMUSCULAR | Status: AC
  Filled 2015-04-28: qty 10

## 2015-04-28 MED ORDER — 0.9 % SODIUM CHLORIDE (POUR BTL) OPTIME
TOPICAL | Status: DC | PRN
  Administered 2015-04-28 (×3): 1000 mL

## 2015-04-28 MED ORDER — CIPROFLOXACIN-DEXAMETHASONE 0.3-0.1 % OT SUSP
OTIC | Status: AC
  Filled 2015-04-28: qty 7.5

## 2015-04-28 MED ORDER — MORPHINE SULFATE (PF) 2 MG/ML IV SOLN: 2.0000 mg | INTRAVENOUS | Status: DC | PRN

## 2015-04-28 MED ORDER — ROCURONIUM BROMIDE 50 MG/5ML IV SOLN
INTRAVENOUS | Status: AC
  Filled 2015-04-28: qty 1

## 2015-04-28 MED ORDER — ALBUTEROL SULFATE HFA 108 (90 BASE) MCG/ACT IN AERS
INHALATION_SPRAY | RESPIRATORY_TRACT | Status: AC
  Filled 2015-04-28: qty 6.7

## 2015-04-28 MED ORDER — PHENYLEPHRINE HCL 10 MG/ML IJ SOLN
INTRAMUSCULAR | Status: DC | PRN
  Administered 2015-04-28 (×8): 80 ug via INTRAVENOUS

## 2015-04-28 MED ORDER — MIDAZOLAM HCL 2 MG/2ML IJ SOLN
INTRAMUSCULAR | Status: AC
  Filled 2015-04-28: qty 4

## 2015-04-28 MED ORDER — LATANOPROST 0.005 % OP SOLN
1.0000 [drp] | Freq: Every day | OPHTHALMIC | Status: DC
  Administered 2015-04-28: 1 [drp] via OPHTHALMIC
  Filled 2015-04-28 (×2): qty 2.5

## 2015-04-28 MED ORDER — PROPOFOL 10 MG/ML IV BOLUS
INTRAVENOUS | Status: DC | PRN
  Administered 2015-04-28: 110 mg via INTRAVENOUS
  Administered 2015-04-28 (×2): 20 mg via INTRAVENOUS

## 2015-04-28 MED ORDER — EPHEDRINE SULFATE 50 MG/ML IJ SOLN
INTRAMUSCULAR | Status: DC | PRN
  Administered 2015-04-28: 5 mg via INTRAVENOUS

## 2015-04-28 MED ORDER — PROPOFOL 10 MG/ML IV BOLUS
INTRAVENOUS | Status: AC
  Filled 2015-04-28: qty 20

## 2015-04-28 MED ORDER — DEXAMETHASONE SODIUM PHOSPHATE 4 MG/ML IJ SOLN
INTRAMUSCULAR | Status: DC | PRN
  Administered 2015-04-28: 10 mg via INTRAVENOUS

## 2015-04-28 MED ORDER — ONDANSETRON HCL 4 MG PO TABS: 4.0000 mg | ORAL_TABLET | ORAL | Status: DC | PRN

## 2015-04-28 MED ORDER — EPINEPHRINE HCL (NASAL) 0.1 % NA SOLN
NASAL | Status: AC
  Filled 2015-04-28: qty 30

## 2015-04-28 MED ORDER — OXYCODONE HCL 5 MG/5ML PO SOLN: 5.0000 mg | Freq: Once | ORAL | Status: DC | PRN

## 2015-04-28 MED ORDER — LIDOCAINE HCL (CARDIAC) 20 MG/ML IV SOLN
INTRAVENOUS | Status: DC | PRN
  Administered 2015-04-28: 40 mg via INTRAVENOUS

## 2015-04-28 MED ORDER — PHENYLEPHRINE 40 MCG/ML (10ML) SYRINGE FOR IV PUSH (FOR BLOOD PRESSURE SUPPORT)
PREFILLED_SYRINGE | INTRAVENOUS | Status: AC
  Filled 2015-04-28: qty 30

## 2015-04-28 MED ORDER — LACTATED RINGERS IV SOLN
INTRAVENOUS | Status: DC | PRN
  Administered 2015-04-28: 09:00:00 via INTRAVENOUS

## 2015-04-28 MED ORDER — ONDANSETRON HCL 4 MG/2ML IJ SOLN: 4.0000 mg | INTRAMUSCULAR | Status: DC | PRN

## 2015-04-28 MED ORDER — FUROSEMIDE 20 MG PO TABS: 20.0000 mg | ORAL_TABLET | ORAL | Status: DC | PRN

## 2015-04-28 MED ORDER — SODIUM CHLORIDE 0.9 % IR SOLN
Status: DC | PRN
  Administered 2015-04-28: 1000 mL

## 2015-04-28 MED ORDER — MELATONIN 5 MG PO TABS: 20.0000 mg | ORAL_TABLET | Freq: Every day | ORAL | Status: DC

## 2015-04-28 MED ORDER — MOMETASONE FURO-FORMOTEROL FUM 200-5 MCG/ACT IN AERO
2.0000 | INHALATION_SPRAY | Freq: Two times a day (BID) | RESPIRATORY_TRACT | Status: DC
  Administered 2015-04-28 – 2015-04-29 (×2): 2 via RESPIRATORY_TRACT
  Filled 2015-04-28: qty 8.8

## 2015-04-28 MED ORDER — FENTANYL CITRATE (PF) 100 MCG/2ML IJ SOLN: 25.0000 ug | INTRAMUSCULAR | Status: DC | PRN

## 2015-04-28 MED ORDER — FLUTICASONE PROPIONATE 50 MCG/ACT NA SUSP
1.0000 | Freq: Every evening | NASAL | Status: DC
  Administered 2015-04-28: 1 via NASAL
  Filled 2015-04-28: qty 16

## 2015-04-28 MED ORDER — LIDOCAINE-EPINEPHRINE 1 %-1:100000 IJ SOLN
INTRAMUSCULAR | Status: AC
  Filled 2015-04-28: qty 1

## 2015-04-28 MED ORDER — SUCCINYLCHOLINE CHLORIDE 20 MG/ML IJ SOLN
INTRAMUSCULAR | Status: DC | PRN
  Administered 2015-04-28: 50 mg via INTRAVENOUS

## 2015-04-28 MED ORDER — DEXTROSE 5 % IV SOLN
10.0000 mg | INTRAVENOUS | Status: DC | PRN
  Administered 2015-04-28: 40 ug/min via INTRAVENOUS

## 2015-04-28 MED ORDER — TIMOLOL HEMIHYDRATE 0.5 % OP SOLN: 1.0000 [drp] | Freq: Every day | OPHTHALMIC | Status: DC

## 2015-04-28 MED ORDER — LIDOCAINE-EPINEPHRINE 1 %-1:100000 IJ SOLN
INTRAMUSCULAR | Status: DC | PRN
  Administered 2015-04-28: 10 mL

## 2015-04-28 MED ORDER — FENTANYL CITRATE (PF) 100 MCG/2ML IJ SOLN
INTRAMUSCULAR | Status: DC | PRN
  Administered 2015-04-28: 100 ug via INTRAVENOUS

## 2015-04-28 MED FILL — Epinephrine HCl Inj 1 MG/ML: INTRAMUSCULAR | Qty: 30 | Status: AC

## 2015-04-28 SURGICAL SUPPLY — 62 items
ADH SKN CLS APL DERMABOND .7 (GAUZE/BANDAGES/DRESSINGS)
BALL CTTN LRG ABS STRL LF (GAUZE/BANDAGES/DRESSINGS) ×1
BLADE 10 SAFETY STRL DISP (BLADE) ×3 IMPLANT
BLADE SURG 15 STRL LF DISP TIS (BLADE) IMPLANT
BLADE SURG 15 STRL SS (BLADE)
BLADE SURG ROTATE 9660 (MISCELLANEOUS) ×3 IMPLANT
BUR DIAMOND 1.5 31 (BURR) ×2 IMPLANT
BUR DIAMOND COARSE 3.0 (BURR) ×2 IMPLANT
BUR RND OSTEON ELITE 6.0 (BURR) ×1 IMPLANT
BUR RND OSTEON ELITE 6.0MM (BURR) ×1
CANISTER SUCTION 2500CC (MISCELLANEOUS) ×3 IMPLANT
CORDS BIPOLAR (ELECTRODE) IMPLANT
COTTONBALL LRG STERILE PKG (GAUZE/BANDAGES/DRESSINGS) ×3 IMPLANT
COVER SURGICAL LIGHT HANDLE (MISCELLANEOUS) ×3 IMPLANT
DERMABOND ADVANCED (GAUZE/BANDAGES/DRESSINGS)
DERMABOND ADVANCED .7 DNX12 (GAUZE/BANDAGES/DRESSINGS) IMPLANT
DRAPE MICROSCOPE LEICA 54X105 (DRAPE) ×3 IMPLANT
DRAPE NEUROLOGICAL W/INCISE (DRAPES) ×3 IMPLANT
DRAPE PROXIMA HALF (DRAPES) ×2 IMPLANT
DRAPE SURG 17X23 STRL (DRAPES) ×3 IMPLANT
DRSG GLASSCOCK MASTOID ADT (GAUZE/BANDAGES/DRESSINGS) ×2 IMPLANT
DRSG GLASSCOCK MASTOID PED (GAUZE/BANDAGES/DRESSINGS) IMPLANT
ELECT COATED BLADE 2.86 ST (ELECTRODE) ×3 IMPLANT
ELECT PAIRED SUBDERMAL (MISCELLANEOUS) ×3
ELECT REM PT RETURN 9FT ADLT (ELECTROSURGICAL) ×3
ELECTRODE PAIRED SUBDERMAL (MISCELLANEOUS) IMPLANT
ELECTRODE REM PT RTRN 9FT ADLT (ELECTROSURGICAL) ×1 IMPLANT
GAUZE SPONGE 4X4 12PLY STRL (GAUZE/BANDAGES/DRESSINGS) ×3 IMPLANT
GLOVE BIOGEL PI IND STRL 7.0 (GLOVE) IMPLANT
GLOVE BIOGEL PI IND STRL 7.5 (GLOVE) IMPLANT
GLOVE BIOGEL PI INDICATOR 7.0 (GLOVE) ×2
GLOVE BIOGEL PI INDICATOR 7.5 (GLOVE) ×2
GLOVE ECLIPSE 7.5 STRL STRAW (GLOVE) ×3 IMPLANT
GLOVE SURG SS PI 7.0 STRL IVOR (GLOVE) ×2 IMPLANT
GOWN STRL REUS W/ TWL LRG LVL3 (GOWN DISPOSABLE) ×2 IMPLANT
GOWN STRL REUS W/TWL LRG LVL3 (GOWN DISPOSABLE) ×6
HEMOSTAT SURGICEL 2X14 (HEMOSTASIS) ×3 IMPLANT
KIT BASIN OR (CUSTOM PROCEDURE TRAY) ×3 IMPLANT
KIT ROOM TURNOVER OR (KITS) ×3 IMPLANT
NDL 18GX1X1/2 (RX/OR ONLY) (NEEDLE) IMPLANT
NDL HYPO 25GX1X1/2 BEV (NEEDLE) IMPLANT
NEEDLE 18GX1X1/2 (RX/OR ONLY) (NEEDLE) ×3 IMPLANT
NEEDLE HYPO 25GX1X1/2 BEV (NEEDLE) IMPLANT
NS IRRIG 1000ML POUR BTL (IV SOLUTION) ×3 IMPLANT
PAD ARMBOARD 7.5X6 YLW CONV (MISCELLANEOUS) ×6 IMPLANT
PATTIES SURGICAL .5 X.5 (GAUZE/BANDAGES/DRESSINGS) IMPLANT
PENCIL BUTTON HOLSTER BLD 10FT (ELECTRODE) ×3 IMPLANT
PUNCH BIOPSY (OTIC EAR SUPPLIES) IMPLANT
PUNCH BIOPSY DERMAL 6MM STRL (MISCELLANEOUS) ×2 IMPLANT
SPECIMEN JAR SMALL (MISCELLANEOUS) IMPLANT
SPONGE SURGIFOAM ABS GEL 12-7 (HEMOSTASIS) ×3 IMPLANT
SUT BONE WAX W31G (SUTURE) IMPLANT
SUT PLAIN 5 0 P 3 18 (SUTURE) IMPLANT
SUT VICRYL RAPIDE 4/0 PS 2 (SUTURE) ×3 IMPLANT
SYR 3ML LL SCALE MARK (SYRINGE) ×3 IMPLANT
SYR TB 1ML LUER SLIP (SYRINGE) IMPLANT
TOWEL OR 17X24 6PK STRL BLUE (TOWEL DISPOSABLE) ×3 IMPLANT
TRAY ENT MC OR (CUSTOM PROCEDURE TRAY) ×3 IMPLANT
TUBING EXTENTION W/L.L. (IV SETS) ×3 IMPLANT
TUBING IRRIGATION (MISCELLANEOUS) ×3 IMPLANT
VENT IRR SPI W TUB AD (MISCELLANEOUS) IMPLANT
WIPE INSTRUMENT VISIWIPE 73X73 (MISCELLANEOUS) ×3 IMPLANT

## 2015-04-28 NOTE — Op Note (Signed)
DATE OF PROCEDURE:  04/28/2015                              OPERATIVE REPORT  SURGEON:  Leta Baptist, MD  PREOPERATIVE DIAGNOSES: 1. Chronic left otomastoiditis 2. Possible cholesteatoma  POSTOPERATIVE DIAGNOSES: 1. Chronic left otomastoiditis  PROCEDURE PERFORMED:  Left canal-wall-up tympanomastoidectomy (CPT [phone])  ANESTHESIA:  General endotracheal tube anesthesia.  COMPLICATIONS:  None.  ESTIMATED BLOOD LOSS:  Less than 100 mL  INDICATION FOR PROCEDURE:  [name] is a 81 y.o. female with a history of chronic left ear drainage and otomastoiditis. She was treated with multiple courses of topical and systemic antibiotics. On her temporal bone CT scan, the left middle ear space and the left mastoid cavity were completely opacified. The findings were concerning for cholesteatoma. The patient's surgery was originally scheduled for last year. However the surgery was repeatedly postponed due to her poor pulmonary condition. The patient was finally cleared by her pulmonologist for surgery. In addition, the patient also has a history of bilateral severe to profound sensorineural hearing loss. She is a candidate for cochlear implantation in the future, once her otomastoiditis and cholesteatoma are cleared. Based on the above findings, the decision was made for the patient to undergo the tympanomastoidectomy procedure. Likelihood of success in reducing symptoms was also discussed.  The risks, benefits, alternatives, and details of the procedure were discussed with the patient.  Questions were invited and answered.  Informed consent was obtained.  DESCRIPTION:  The patient was taken to the operating room and placed supine on the operating table.  General endotracheal tube anesthesia was administered by the anesthesiologist.  The patient was positioned and prepped and draped in a standard fashion for left ear surgery. Facial nerve monitoring electrodes were placed. The facial nerve monitoring system  was functional during the case.  1% lidocaine with 1-100,000 epinephrine was infiltrated in the left postauricular crease and into the left ear canal. Under the operating microscope, the left ear canal was cleaned of all cerumen. The left tympanic membrane was noted to be significantly retracted. A standard tympanomeatal flap was elevated in a standard fashion. The left middle ear space was filled with fibrotic tissue. Careful dissection was then performed to free the tympanic membrane from the middle ear fibrosis. The ossicles were carefully dissected free from the fibrotic tissue. No obvious cholesteatoma was noted.  Attention was then focused on the mastoidectomy portion of the case. A standard postauricular incision was made. The soft tissue overlying the mastoid cortex was carefully elevated. A standard cortical mastoidectomy was performed. The tegmen, sigmoid sinus, and the mastoid antrum were identified. A large amount of granulation tissue was removed from the mastoid cavities. The facial recess was then opened. No cholesteatoma was noted within the mastoid cavity or the facial recess. The middle ear space and the mastoid cavities were copiously irrigated. Gelfoam soaked with Ciprodex were placed in the middle ear cavities and the mastoid cavities. The tympanomeatal flap was returned to the normal anatomic position. The postauricular incision was closed in layers with 4-0 Vicryl and Dermabond.  The care of the patient was turned over to the anesthesiologist.  The patient was awakened from anesthesia without difficulty.  She was extubated and transferred to the recovery room in good condition.  OPERATIVE FINDINGS:  Chronic left otomastoiditis, with a significant amount of granulation tissue and fibrosis within the middle ear space and the mastoid cavities.  SPECIMEN:  None.  FOLLOWUP CARE:  The patient will be observed overnight in the hospital. She will most likely be discharged home on postop  day #1.  [name],SUI W 04/28/2015 11:30 AM

## 2015-04-28 NOTE — H&P (Signed)
Cc: Chronic otomastoiditis, cholesteatoma  HPI: The patient is an 81 year old female who returns today for follow-up evaluation of her bilateral severe to profound hearing loss and possible cholesteatoma. The patient was last seen in December of last year.  At that time, she was noted to have bilateral severe to profound hearing loss.  Her CT scan also showed opacification of bilateral middle ear and mastoid cavities.  The findings were worrisome for bilateral cholesteatomas. The patient was scheduled to undergo left tympanomastoidectomy surgery.  However, the surgeries were canceled due to her recurrent respiratory failure.  The patient presents today reporting significant improvement in her pulmonary condition.  Her pulmonologist has cleared her for the ear surgery. The patient continues to have significant difficulty with her hearing.  She is not receiving much benefit from her hearing aid.  She has not experienced any drainage from her ear recently. No other ENT, GI, or respiratory issue noted since the last visit.   Exam General: Communicates without difficulty, well nourished, no acute distress.   Head: Normocephalic, no evidence injury, no tenderness, facial buttresses intact without stepoff.   Eyes: PERRL, EOMI.   No scleral icterus, conjunctivae clear.   Neuro: CN II exam reveals vision grossly intact.   No nystagmus at any point of gaze.   EAC: Normal ear canals. The left tympanic membrane perforation has healed.  Both TMs are retracted and opacified.  Nose: External evaluation reveals normal support and skin without lesions.   Dorsum is intact.   Anterior rhinoscopy reveals congested and edematous mucosa over anterior aspect of the inferior turbinates and nasal septum.   No purulence is noted.   Middle meatus is not well visualized.   Oral:  Oral cavity and oropharynx are intact, symmetric, without erythema or edema.   Mucosa is moist without lesions.   Neck: Full range of motion without pain.    There is no significant lymphadenopathy.   No masses palpable.   Thyroid bed within normal limits to palpation.   Parotid glands and submandibular glands equal bilaterally without mass.   Trachea is midline.   Neuro:  CN 2-12 grossly intact.   Gait normal.  Assessment 1.  History of bilateral severe to profound hearing loss.  The patient is a candidate for cochlear implantation.  2.  Chronic opacification of bilateral middle ear and mastoid cavities.  The findings are worrisome for bilateral cholesteatomas.    Plan 1.  The physical exam findings are reviewed with the patient.   2.  In light of her improved pulmonary status, we will proceed with left tympanomastoidectomy surgery.  Once her cholesteatoma is under control, she may benefit from cochlear implantation procedure.

## 2015-04-28 NOTE — Anesthesia Procedure Notes (Signed)
Procedure Name: Intubation Date/Time: 04/28/2015 8:54 AM Performed by: Ollen Bowl Pre-anesthesia Checklist: Patient identified, Timeout performed, Emergency Drugs available, Suction available and Patient being monitored Patient Re-evaluated:Patient Re-evaluated prior to inductionOxygen Delivery Method: Circle system utilized and Simple face mask Preoxygenation: Pre-oxygenation with 100% oxygen Intubation Type: Combination inhalational/ intravenous induction Ventilation: Mask ventilation without difficulty Laryngoscope Size: Miller and 2 Grade View: Grade I Tube type: Oral Tube size: 6.5 mm Number of attempts: 2 Airway Equipment and Method: Patient positioned with wedge pillow,  Stylet and Bougie stylet Placement Confirmation: ETT inserted through vocal cords under direct vision,  positive ETCO2 and breath sounds checked- equal and bilateral Secured at: 22 cm Tube secured with: Tape Dental Injury: Bloody posterior oropharynx  Difficulty Due To: Difficulty was anticipated and Difficult Airway- due to anterior larynx Future Recommendations: Recommend- induction with short-acting agent, and alternative techniques readily available Comments: Pt with easy mask and grade 1 view obtained with DL. Unable to pass size 7 OETT into airway even with the aid of bougie stylet. Size 6.5 tube passed easily over bougie into the airway and secured.

## 2015-04-28 NOTE — Discharge Instructions (Signed)
POSTOPERATIVE INSTRUCTIONS FOR PATIENTS HAVING MASTOIDECTOMY SURGERY  1. You may have nausea, vomiting, or a low grade fever for a few days after surgery. This is not unusual.  However, if the nausea and vomiting become severe or last more than one day, please call our office. Medication for nausea may be prescribed. You may take Tylenol every four hours for fever. If your fever should rise above 101 F, please contact our office. 2. Limit your activities for one week. This includes avoiding heavy lifting (over 20lbs), vigorous exercise, and contact sports. 3. Do not blow your nose for approximately one week.  Any accumulation in the nose should be drawn back into the throat and expectorated through the mouth to avoid infecting the ear. If it is necessary to sneeze, do so with your mouth open to decrease pressure to your ears. Do not hold your nose to avoid sneezing.  4. You may wash your hair 2 days after the operation. Please protect the ear and any external incision from water. We recommend placing some plastic wrap over the ear and incision to help protect against water. It may be necessary to have someone help you during the first several washings.  5. Try to keep the incision clean and dry. You should clean crust from the incisional area with diluted hydrogen peroxide. Any time you are going to clean your ear, please wash your hands thoroughly prior to starting. 6. Some dull postoperative ear pain is expected. Your physician may prescribe pain medicine to help relieve your discomfort. If your postoperative pain increases and your medication is not helping, please call the office before taking any other medication that we have not prescribed or recommended. 7. If any of the following should occur, contact Dr.Ason Heslin:   (Office: [phone])) a. Persistent bleeding                                                             b. Persistent fever c. Purulent drainage (pus) from the ear or  incision d. Increasing redness around the suture line e. Persistent pain or dizziness f. Facial weakness 8. Sometimes, with a larger incision behind the ear, the incision may open and drain. If it occurs, please contact our office. 9. If your physician prescribes an antibiotic, fill the prescription promptly and take all of the medicine as directed until the entire supply is gone. 10.  You may experience some popping and cracking sounds in the ear for up to several weeks. It may sound like you are talking in a barrel or a tunnel. This is normal and should not cause concern. 11. Because a nerve for taste passes through your ear, it is not unusual for your taste sensation to be altered for several weeks or months. 12. You may experience some numbness in your outer ear, earlobe, and the incision area. This is normal, and most of the numbness will be expected to fade over a period of time.  13. Your eardrum may look pink or red for up to a month postoperatively. The red coloration is due to fluid in the middle ear. The change in color should not be confused with infection.  14. It is important to return to our office for your postoperative appointment as scheduled. If for some reason you were not given a postoperative appointment, please call our office at [phone].

## 2015-04-28 NOTE — Transfer of Care (Signed)
Immediate Anesthesia Transfer of Care Note  Patient: [name]  Procedure(s) Performed: Procedure(s): LEFT TYMPANOMASTOIDECTOMY (Left)  Patient Location: PACU  Anesthesia Type:General  Level of Consciousness: awake and alert   Airway & Oxygen Therapy: Patient Spontanous Breathing and Patient connected to nasal cannula oxygen  Post-op Assessment: Report given to RN and Post -op Vital signs reviewed and stable  Post vital signs: Reviewed and stable  Last Vitals:  Filed Vitals:   04/28/15 0731  BP: 115/61  Pulse:   Temp:   Resp:     Complications: No apparent anesthesia complications

## 2015-04-28 NOTE — Anesthesia Preprocedure Evaluation (Addendum)
Anesthesia Evaluation  Patient identified by MRN, date of birth, ID band Patient awake    Reviewed: Allergy & Precautions, NPO status , Patient's Chart, lab work & pertinent test results  History of Anesthesia Complications Negative for: history of anesthetic complications  Airway Mallampati: III  TM Distance: <3 FB Neck ROM: Full    Dental  (+) Teeth Intact   Pulmonary shortness of breath, neg sleep apnea, COPD,  COPD inhaler, neg recent URI, former smoker, neg PE    + wheezing      Cardiovascular negative cardio ROS   Rhythm:Regular     Neuro/Psych CVA, No Residual Symptoms negative psych ROS   GI/Hepatic Neg liver ROS, GERD  Medicated and Controlled,  Endo/Other  negative endocrine ROS  Renal/GU negative Renal ROS     Musculoskeletal  (+) Arthritis ,   Abdominal   Peds  Hematology  (+) anemia ,   Anesthesia Other Findings   Reproductive/Obstetrics                            Anesthesia Physical Anesthesia Plan  ASA: III  Anesthesia Plan: General   Post-op Pain Management:    Induction: Intravenous  Airway Management Planned: Oral ETT  Additional Equipment: None  Intra-op Plan:   Post-operative Plan: Extubation in OR  Informed Consent: I have reviewed the patients History and Physical, chart, labs and discussed the procedure including the risks, benefits and alternatives for the proposed anesthesia with the patient or authorized representative who has indicated his/her understanding and acceptance.   Dental advisory given  Plan Discussed with: CRNA and Surgeon  Anesthesia Plan Comments:         Anesthesia Quick Evaluation

## 2015-04-28 NOTE — Progress Notes (Signed)
Hearing aid taken out in OR and placed in cup with lid containing patient label. Taken to PACU by RN

## 2015-04-28 NOTE — Anesthesia Postprocedure Evaluation (Signed)
  Anesthesia Post-op Note  Patient: [name]  Procedure(s) Performed: Procedure(s): LEFT TYMPANOMASTOIDECTOMY (Left)  Patient Location: PACU  Anesthesia Type:General  Level of Consciousness: awake  Airway and Oxygen Therapy: Patient Spontanous Breathing  Post-op Pain: none  Post-op Assessment: Post-op Vital signs reviewed, Patient's Cardiovascular Status Stable, Respiratory Function Stable, Patent Airway, No signs of Nausea or vomiting and Pain level controlled              Post-op Vital Signs: Reviewed and stable  Last Vitals:  Filed Vitals:   04/28/15 1347  BP: 135/66  Pulse: 78  Temp: 36.8 C  Resp: 19    Complications: No apparent anesthesia complications

## 2015-04-29 ENCOUNTER — Encounter: Payer: Self-pay | Admitting: Internal Medicine

## 2015-04-29 ENCOUNTER — Encounter (HOSPITAL_COMMUNITY): Payer: Self-pay | Admitting: Otolaryngology

## 2015-04-29 DIAGNOSIS — H7012 Chronic mastoiditis, left ear: Secondary | ICD-10-CM | POA: Diagnosis not present

## 2015-04-29 MED ORDER — OXYCODONE-ACETAMINOPHEN 5-325 MG PO TABS: 1.0000 | ORAL_TABLET | ORAL | Status: DC | PRN

## 2015-04-29 NOTE — Discharge Summary (Signed)
Physician Discharge Summary  Patient ID: [name] MRN: [id number] DOB/AGE: [birth year] 81 y.o.  Admit date: 04/28/2015 Discharge date: 04/29/2015  Admission Diagnoses: Chronic left otomastoiditis, cholesteatoma  Discharge Diagnoses: Chronic left otomastoiditis Active Problems:   History of tympanomastoidectomy   Discharged Condition: good  Hospital Course: Pt had an uneventful overnight stay. Pt tolerated po well. No bleeding. No stridor. Facial nerve function intact.  Consults: None  Significant Diagnostic Studies: None  Treatments: surgery: Left tympanomastoidectomy  Discharge Exam: Blood pressure 120/64, pulse 67, temperature 98 F (36.7 C), temperature source Oral, resp. rate 18, height 5\' 1"  (1.549 m), weight 58.15 kg (128 lb 3.2 oz), SpO2 97 %. Incision/Wound:c/d/i CN 7 intact  Disposition: 03-Skilled Nursing Facility  Discharge Instructions    Activity as tolerated - No restrictions    Complete by:  As directed      Diet general    Complete by:  As directed             Medication List    STOP taking these medications        acetaminophen 325 MG tablet  Commonly known as:  TYLENOL     acetaminophen 500 MG tablet  Commonly known as:  TYLENOL     meloxicam 7.5 MG tablet  Commonly known as:  MOBIC     warfarin 3 MG tablet  Commonly known as:  COUMADIN      TAKE these medications        b complex vitamins capsule  Take 1 capsule by mouth daily.     calcium-vitamin D 500-200 MG-UNIT tablet  Commonly known as:  OSCAL WITH D  Take 1 tablet by mouth daily.     cholecalciferol 1000 UNITS tablet  Commonly known as:  VITAMIN D  One daily for vitamin D supplement     fluticasone 50 MCG/ACT nasal spray  Commonly known as:  FLONASE  Place 1 spray into both nostrils every evening.     Fluticasone-Salmeterol 500-50 MCG/DOSE Aepb  Commonly known as:  ADVAIR DISKUS  INHALE 1 PUFF EVERY 12 HOURS.     furosemide 20 MG tablet  Commonly known as:   LASIX  Take 1 tablet (20 mg total) by mouth as needed (when weight increases 3 lbs overnight, 5 lbs in a week, or develops sob or LE edema).     guaiFENesin 600 MG 12 hr tablet  Commonly known as:  MUCINEX  Take 600 mg by mouth 2 (two) times daily.     latanoprost 0.005 % ophthalmic solution  Commonly known as:  XALATAN  Place 1 drop into both eyes at bedtime.     Melatonin 5 MG Tabs  Take 20 mg by mouth at bedtime.     multivitamin with minerals tablet  Take 1 tablet by mouth daily.     oxyCODONE-acetaminophen 5-325 MG tablet  Commonly known as:  PERCOCET/ROXICET  Take 1 tablet by mouth every 4 (four) hours as needed for severe pain.     pantoprazole 40 MG tablet  Commonly known as:  PROTONIX  TAKE 1 TABLET TWICE DAILY.     saccharomyces boulardii 250 MG capsule  Commonly known as:  FLORASTOR  Take 250 mg by mouth every evening.     sodium chloride 0.65 % Soln nasal spray  Commonly known as:  OCEAN  Place 1 spray into both nostrils every hour as needed for congestion.     sulfamethoxazole-trimethoprim 800-160 MG tablet  Commonly known as:  BACTRIM DS,SEPTRA DS  Take 1 tablet by mouth every Monday, Wednesday, and Friday. HOLD WHILE ON AUGMENTIN, THEN RESUME AS PREVIOUS     timolol 0.5 % ophthalmic solution  Commonly known as:  BETIMOL  Place 1 drop into the left eye daily with breakfast.           Follow-up Information    Follow up with Ascencion Dike, MD On 05/05/2015.   Specialty:  Otolaryngology   Why:  at 1:50pm   Contact information:   [address] [place] [address] [phone]       Signed: Ascencion Dike 04/29/2015, 8:03 AM

## 2015-04-29 NOTE — Discharge Planning (Signed)
Patient discharged home in stable condition. Verbalizes understanding of all discharge instructions, including home medications and follow up appointments. 

## 2015-05-05 ENCOUNTER — Ambulatory Visit: Payer: Medicare Other | Admitting: Hematology & Oncology

## 2015-05-05 ENCOUNTER — Other Ambulatory Visit: Payer: Medicare Other

## 2015-05-07 ENCOUNTER — Telehealth: Payer: Self-pay | Admitting: *Deleted

## 2015-05-07 MED ORDER — WARFARIN SODIUM 4 MG PO TABS: ORAL_TABLET | ORAL | Status: DC

## 2015-05-07 NOTE — Telephone Encounter (Signed)
Received fax from [place] Results 05/07/2015  INR:  1.6 Current dose of Coumadin: 3mg  daily Per Jessica--Increase Coumadin to 4mg  daily and recheck in 1 week [facility] for patient and faxed results to [facility] Fax: [phone] Faxed Rx to [facility]

## 2015-05-12 ENCOUNTER — Telehealth: Payer: Self-pay | Admitting: Hematology & Oncology

## 2015-05-12 ENCOUNTER — Encounter: Payer: Self-pay | Admitting: Nurse Practitioner

## 2015-05-12 ENCOUNTER — Non-Acute Institutional Stay: Payer: Medicare Other | Admitting: Nurse Practitioner

## 2015-05-12 DIAGNOSIS — R509 Fever, unspecified: Secondary | ICD-10-CM | POA: Diagnosis not present

## 2015-05-12 DIAGNOSIS — M79671 Pain in right foot: Secondary | ICD-10-CM | POA: Diagnosis not present

## 2015-05-12 DIAGNOSIS — K219 Gastro-esophageal reflux disease without esophagitis: Secondary | ICD-10-CM

## 2015-05-12 DIAGNOSIS — M3130 Wegener's granulomatosis without renal involvement: Secondary | ICD-10-CM

## 2015-05-12 NOTE — Assessment & Plan Note (Signed)
Stable, continue Pantoprazole 40mg daily.   

## 2015-05-12 NOTE — Progress Notes (Signed)
Patient ID: JORGE AMPARO, female   DOB: Feb 26, 1933, 81 y.o.   MRN: [id number]  Location:  [facility] Provider:  Marlana Latus NP  Code Status:  DNR Goals of care: Advanced Directive information    Chief Complaint  Patient presents with  . Medical Management of Chronic Issues  . Acute Visit    cough     HPI: Patient is a 81 y.o. female seen in the [facility] at [facility] today for evaluation of cough.  CXR 05/11/15 no infiltrates. Low grade T 05/11/15  Review of Systems  Constitutional: Negative for fever, chills and diaphoresis.       Frail and generally weak.  HENT: Positive for hearing loss (Deaf on left, hearing aid on right ). Negative for congestion, ear discharge, ear pain and tinnitus.   Eyes: Negative.  Negative for pain and redness.  Respiratory: Positive for cough and shortness of breath. Negative for wheezing (Intermittent).        History Wegener's granulomatosis. History of chronic sinusitis related to her Wegener's.  Cardiovascular: Positive for leg swelling. Negative for chest pain and palpitations.       Trace R+L foot/ankle, R>L  Gastrointestinal: Negative for nausea, abdominal pain, diarrhea and constipation.       History of GERD.  Genitourinary: Positive for frequency. Negative for dysuria, urgency, hematuria and flank pain.       Frequency induced by furosemide. Episodes of incontinence of urine.  Musculoskeletal: Negative for myalgias, back pain and neck pain.       Generalized weakness Right foot pain, worsens with walking  Skin: Negative.  Negative for rash.  Neurological: Negative for dizziness, tremors, seizures, weakness and headaches.       Neuropathic foot pain.   Endo/Heme/Allergies: Negative for polydipsia. Does not bruise/bleed easily.       Chronic anemia.  Psychiatric/Behavioral: Negative for suicidal ideas and hallucinations. The patient is nervous/anxious.        Recently her husband has been admitted to SNF    Past Medical History    Diagnosis Date  . Wegener's granulomatosis ([place])   . OA (osteoarthritis)   . Blindness of one eye   . Hearing loss in left ear     hearing aid both ears  . Bladder incontinence   . Glaucoma   . Clotting disorder ([place])   . History of pulmonary embolism 1997  . Shortness of breath dyspnea   . Pneumonia 12/15  . Multiple allergies   . Chronic sinusitis   . Acute respiratory failure with hypoxia ([place])   . Interstitial emphysema ([place])   . Aspergillosis ([place])   . Stroke [facility])     ??? blind left eye  . Cancer ([place])     melanoma   (chemo)  . Difficult intubation     Difficult airway with intubation for ARF 07/22/14 (due to anterior larynx, also had mucous plug)    Patient Active Problem List   Diagnosis Date Noted  . Fever, unspecified 05/12/2015  . History of tympanomastoidectomy 04/28/2015  . Right foot pain 03/11/2015  . Ankle ulcer ([place]) 12/22/2014  . Generalized anxiety disorder 09/10/2014  . Callus of foot 08/19/2014  . Chronic sinusitis 08/18/2014  . AKI (acute kidney injury) ([place]) 08/15/2014  . Pulmonary nodules 08/13/2014  . Atypical mycobacterial disease 08/13/2014  . Edema 08/10/2014  . History of aspergillosis 08/10/2014  . Deaf 08/10/2014  . Urine incontinence 08/10/2014  . Weak 08/10/2014  . Abnormality of gait 08/10/2014  .  Scoliosis (and kyphoscoliosis), idiopathic 08/10/2014  . Long term current use of anticoagulant therapy 08/10/2014  . Cardiomegaly 08/10/2014  . Hoarse 08/10/2014  . Vitamin D deficiency 08/10/2014  . Anemia of chronic disease 08/03/2014  . GERD (gastroesophageal reflux disease) 08/03/2014  . Constipation 08/03/2014  . Personal history of PE (pulmonary embolism) 08/03/2014  . Dyspnea 07/20/2014  . Left inguinal hernia 11/05/2013  . WEGENERS GRANULOMATOSIS 05/22/2007  . Intrinsic asthma 05/22/2007  . Sinusitis, chronic 05/21/2007    Allergies  Allergen Reactions  . Adhesive [Tape] Rash    Medications: Patient's Medications   New Prescriptions   No medications on file  Previous Medications   B COMPLEX VITAMINS CAPSULE    Take 1 capsule by mouth daily.   CALCIUM-VITAMIN D (OSCAL WITH D) 500-200 MG-UNIT PER TABLET    Take 1 tablet by mouth daily.    CHOLECALCIFEROL (VITAMIN D) 1000 UNITS TABLET    One daily for vitamin D supplement   FLUTICASONE (FLONASE) 50 MCG/ACT NASAL SPRAY    Place 1 spray into both nostrils every evening.    FLUTICASONE-SALMETEROL (ADVAIR DISKUS) 500-50 MCG/DOSE AEPB    INHALE 1 PUFF EVERY 12 HOURS.   FUROSEMIDE (LASIX) 20 MG TABLET    Take 1 tablet (20 mg total) by mouth as needed (when weight increases 3 lbs overnight, 5 lbs in a week, or develops sob or LE edema).   GUAIFENESIN (MUCINEX) 600 MG 12 HR TABLET    Take 600 mg by mouth 2 (two) times daily.   LATANOPROST (XALATAN) 0.005 % OPHTHALMIC SOLUTION    Place 1 drop into both eyes at bedtime.   MELATONIN 5 MG TABS    Take 20 mg by mouth at bedtime.   MULTIPLE VITAMINS-MINERALS (MULTIVITAMIN WITH MINERALS) TABLET    Take 1 tablet by mouth daily.   OXYCODONE-ACETAMINOPHEN (PERCOCET/ROXICET) 5-325 MG TABLET    Take 1 tablet by mouth every 4 (four) hours as needed for severe pain.   PANTOPRAZOLE (PROTONIX) 40 MG TABLET    TAKE 1 TABLET TWICE DAILY.   SACCHAROMYCES BOULARDII (FLORASTOR) 250 MG CAPSULE    Take 250 mg by mouth every evening.    SODIUM CHLORIDE (OCEAN) 0.65 % SOLN NASAL SPRAY    Place 1 spray into both nostrils every hour as needed for congestion.    SULFAMETHOXAZOLE-TRIMETHOPRIM (BACTRIM DS,SEPTRA DS) 800-160 MG PER TABLET    Take 1 tablet by mouth every Monday, Wednesday, and Friday. HOLD WHILE ON AUGMENTIN, THEN RESUME AS PREVIOUS   TIMOLOL (BETIMOL) 0.5 % OPHTHALMIC SOLUTION    Place 1 drop into the left eye daily with breakfast.    WARFARIN (COUMADIN) 4 MG TABLET    Take one tablet by mouth once daily for anticoagulation  Modified Medications   No medications on file  Discontinued Medications   No medications on file     Physical Exam: Filed Vitals:   05/12/15 1340  BP: 110/70  Pulse: 76  Temp: 98.3 F (36.8 C)  TempSrc: Tympanic  Resp: 20   There is no weight on file to calculate BMI.  Physical Exam  Constitutional: She is oriented to person, place, and time. She appears well-developed. No distress.  Thin and frail. Weight stable  since 3/16  HENT:  Head: Normocephalic and atraumatic.  Nose: Nose normal.  Mouth/Throat: Oropharynx is clear and moist. No oropharyngeal exudate.  Bilateral hearing loss. Hearing aid - right ear  Eyes: Conjunctivae and EOM are normal. Pupils are equal, round, and reactive to light. Right  eye exhibits no discharge. Left eye exhibits no discharge. No scleral icterus.  Neck: Normal range of motion. Neck supple. No JVD present. No tracheal deviation present. No thyromegaly present.  Cardiovascular: Normal rate, regular rhythm, normal heart sounds and intact distal pulses.   No murmur heard. Pulmonary/Chest: Effort normal. No stridor. No respiratory distress. She has no wheezes. She has no rales. She exhibits no tenderness.  Voice hoarse  Abdominal: Soft. Bowel sounds are normal. She exhibits no distension and no mass. There is no tenderness. There is no rebound and no guarding.  Musculoskeletal: Normal range of motion. She exhibits edema (trace). She exhibits no tenderness.  R+L foot/ankle, R>L  Lymphadenopathy:    She has no cervical adenopathy.  Neurological: She is alert and oriented to person, place, and time. She has normal reflexes. She displays normal reflexes. No cranial nerve deficit. She exhibits normal muscle tone. Coordination normal.  Skin: Skin is warm and dry. No rash noted. She is not diaphoretic. No erythema. No pallor.  Corns on the left foot at the tip of the third toe and at the fifth metacarpal phalangeal joint slightly lateral.   Psychiatric: She has a normal mood and affect. Her behavior is normal. Judgment and thought content normal.   Emotional when voice her worry not able to walk to SNF to visit her busband.     Labs reviewed: Basic Metabolic Panel:  Recent Labs  07/20/14 1713  07/28/14 0545  08/15/14 0537 08/16/14 0341 04/20/15 1409  NA  --   < > 141  < > 139 140 140  K  --   < > 3.6  < > 3.5 3.7 4.2  CL  --   < > 108  < > 103 105 106  CO2  --   < > 28  --  28 25 28   GLUCOSE  --   < > 97  < > 95 88 104*  BUN  --   < > 23  < > 30* 27* 12  CREATININE  --   < > 0.63  < > 1.14* 0.94 0.79  CALCIUM  --   < > 8.3*  --  8.9 9.6 10.0  MG 2.0  --  2.1  --   --   --   --   < > = values in this interval not displayed.  Liver Function Tests:  Recent Labs  07/23/14 0400 07/26/14 0530 07/28/14 0545 08/01/14  AST 36 36 23 22  ALT 23 43* 39* 31  ALKPHOS 97 90 82 90  BILITOT 0.4 0.5 0.6  --   PROT 5.0* 5.0* 4.6*  --   ALBUMIN 2.3* 2.3* 2.2*  --     CBC:  Recent Labs  09/22/14 1049  11/23/14 1126 02/03/15 1335 04/20/15 1409  WBC 6.0  < > 5.6 5.1 5.8  NEUTROABS 4.2  --  3.9 3.4  --   HGB 12.5  < > 12.8 12.3 11.7*  HCT 39.1  < > 38.5 37.0 36.3  MCV 97  < > 96 96 90.8  PLT 242  --  345 302 311  < > = values in this interval not displayed.  No results found for: TSH Lab Results  Component Value Date   HGBA1C 5.8* 07/28/2014   No results found for: CHOL, HDL, LDLCALC, LDLDIRECT, TRIG, CHOLHDL  Significant Diagnostic Results since last visit: none  Patient Care Team: Estill Dooms, MD as PCP - General (Internal Medicine) Unice Bailey, MD as  Consulting Physician (Rheumatology) Rozetta Nunnery, MD as Consulting Physician (Otolaryngology) Marcial Pacas, MD as Consulting Physician (Neurology) Michel Bickers, MD as Consulting Physician (Infectious Diseases) Volanda Napoleon, MD as Consulting Physician (Oncology) Juanito Doom, MD as Consulting Physician (Pulmonary Disease) Tanda Rockers, MD as Consulting Physician (Pulmonary Disease) Estill Dooms, MD as Consulting Physician (Geriatric  Medicine) Coralie Keens, MD as Consulting Physician (General Surgery) Ruchy Wildrick X, NP as Nurse Practitioner (Nurse Practitioner)  Assessment/Plan Problem List Items Addressed This Visit    WEGENERS GRANULOMATOSIS - Primary (Chronic)    CXR 05/11/15 no infiltrates, continue Bactrim, Mucinex, Advair, Flonase. Observe.       GERD (gastroesophageal reflux disease)    Stable, continue Pantoprazole 40mg  daily.       Right foot pain    Chronic, better when walking less, takes Tylenol for pain.       Fever, unspecified    Lower grade 05/11/15, CXR 05/11/15 showed no infiltrates, will update CBC, CMP, TSH, UA C/S in am. Observe.           Family/ staff Communication: PT to eval and tx, observe the patient.   Labs/tests ordered: CXR 05/11/15, CBC, CMP, TSH, UA C/S  [facility] Shajuan Musso NP Geriatrics [facility] [address], [place] [address] On Call:  [phone] & follow prompts after 5pm & weekends Office Phone:  [phone] Office Fax:  [phone]

## 2015-05-12 NOTE — Assessment & Plan Note (Signed)
Chronic, better when walking less, takes Tylenol for pain.

## 2015-05-12 NOTE — Assessment & Plan Note (Signed)
Lower grade 05/11/15, CXR 05/11/15 showed no infiltrates, will update CBC, CMP, TSH, UA C/S in am. Observe.

## 2015-05-12 NOTE — Assessment & Plan Note (Signed)
CXR 05/11/15 no infiltrates, continue Bactrim, Mucinex, Advair, Flonase. Observe.

## 2015-05-13 ENCOUNTER — Other Ambulatory Visit: Payer: Medicare Other

## 2015-05-13 ENCOUNTER — Ambulatory Visit: Payer: Medicare Other | Admitting: Hematology & Oncology

## 2015-05-13 ENCOUNTER — Other Ambulatory Visit: Payer: Self-pay | Admitting: Nurse Practitioner

## 2015-05-13 DIAGNOSIS — D638 Anemia in other chronic diseases classified elsewhere: Secondary | ICD-10-CM

## 2015-05-13 LAB — HEPATIC FUNCTION PANEL
ALT: 11 U/L (ref 7–35)
AST: 19 U/L (ref 13–35)
Alkaline Phosphatase: 108 U/L (ref 25–125)
BILIRUBIN, TOTAL: 0.5 mg/dL

## 2015-05-13 LAB — CBC AND DIFFERENTIAL
HCT: 32 % — AB (ref 36–46)
Hemoglobin: 10.2 g/dL — AB (ref 12.0–16.0)
Platelets: 344 10*3/uL (ref 150–399)
WBC: 5.2 10*3/mL

## 2015-05-13 LAB — BASIC METABOLIC PANEL
BUN: 12 mg/dL (ref 4–21)
Creatinine: 0.7 mg/dL (ref 0.5–1.1)
GLUCOSE: 89 mg/dL
POTASSIUM: 4 mmol/L (ref 3.4–5.3)
SODIUM: 139 mmol/L (ref 137–147)

## 2015-05-13 LAB — TSH: TSH: 1.56 u[IU]/mL (ref 0.41–5.90)

## 2015-05-14 ENCOUNTER — Telehealth: Payer: Self-pay | Admitting: *Deleted

## 2015-05-14 LAB — POCT INR: INR: 2.7 — AB (ref 0.9–1.1)

## 2015-05-14 NOTE — Telephone Encounter (Signed)
Received Protime results from [facility] 05/14/2015  INR:  2.7  Current Dose of Coumadin: 4mg  daily Previous INR 1.6 Given to Dr. Mariea Clonts to review and sign

## 2015-05-14 NOTE — Telephone Encounter (Signed)
Per Dr. Reed--Continue current Coumadin dose of 4mg  and recheck INR in 1 week 05/21/2015 Patient notified LMOM, faxed to [facility]

## 2015-05-17 ENCOUNTER — Telehealth: Payer: Self-pay

## 2015-05-17 MED ORDER — SACCHAROMYCES BOULARDII 250 MG PO CAPS: ORAL_CAPSULE | ORAL | Status: DC

## 2015-05-17 MED ORDER — AMOXICILLIN 500 MG PO CAPS: ORAL_CAPSULE | ORAL | Status: DC

## 2015-05-17 NOTE — Telephone Encounter (Signed)
Received 05/13/15 urine culture from [facility] 100,000 colonies Proteus Mirabilis. Per Dr. Mariea Clonts push po fluids, Amoxicillin 500mg  bid x 7 days for UTI; Florastor 250mg  bid x 10 days. Called patient, will fax Rx's to [facility].

## 2015-05-20 ENCOUNTER — Ambulatory Visit: Payer: Medicare Other | Admitting: Podiatry

## 2015-05-21 ENCOUNTER — Telehealth: Payer: Self-pay

## 2015-05-21 LAB — POCT INR: INR: 4 — AB (ref 0.9–1.1)

## 2015-05-21 NOTE — Telephone Encounter (Signed)
Received INR results from [facility] INR 4.0. Previous INR 2.7 on  05/14/15. Patient on 4 mg daily. Patient was started on Amoxcillin 500mg  bid x 7 days on 05/17/15. Per Dr. Mariea Clonts, hold Coumadin today and tomorrow (11/18-11/19) Resume 4 mg on Sun 11/20, repeat INR on Tues 11/22. Called patient left message to call back. Called [facility] and spoke to Lewanda Rife, RN clinic nurse gave her the new instructions, asked her to tell patient, because patient is very hard of hearing. Fax orders back to [facility].

## 2015-05-21 NOTE — Telephone Encounter (Signed)
Patient called back for INR 4.0, hold Coumadin today and tomorrow, resume Sunday 11/20, repeat INR Tuesday 05/25/15.Marland Kitchen Patient wrote instructions down and repeated back to me.

## 2015-05-25 LAB — POCT INR: INR: 2.9 — AB (ref <=1.1)

## 2015-06-01 ENCOUNTER — Encounter: Payer: Self-pay | Admitting: Pulmonary Disease

## 2015-06-01 ENCOUNTER — Ambulatory Visit (INDEPENDENT_AMBULATORY_CARE_PROVIDER_SITE_OTHER): Payer: Medicare Other | Admitting: Pulmonary Disease

## 2015-06-01 VITALS — BP 120/66 | HR 83 | Ht 61.0 in | Wt 125.0 lb

## 2015-06-01 DIAGNOSIS — A319 Mycobacterial infection, unspecified: Secondary | ICD-10-CM | POA: Diagnosis not present

## 2015-06-01 DIAGNOSIS — M3130 Wegener's granulomatosis without renal involvement: Secondary | ICD-10-CM

## 2015-06-01 DIAGNOSIS — R918 Other nonspecific abnormal finding of lung field: Secondary | ICD-10-CM

## 2015-06-01 NOTE — Assessment & Plan Note (Signed)
NO recent evidence of recurrence

## 2015-06-01 NOTE — Assessment & Plan Note (Signed)
She has established care at [facility]. She will continue to follow-up with them

## 2015-06-01 NOTE — Progress Notes (Signed)
Subjective:    Patient ID: [name], female    DOB: [birth year], 81 y.o.   MRN: [id number]  Synopsis: Mrs. Agner has asthma with normal pulmonary function testing. She also has a history of Wegener's granulomatosis and has been treated with high-dose immunosuppressants for many years. She has significant sinus disease related to this and she has lower airway obstruction which has been treated with balloon dilation in the past.  HPI Chief Complaint  Patient presents with  . Follow-up    pt c/o sometimes prod cough with clear mucus.  no other complaints today.     [name] showed up late today and is with a friend. She is now living in assisted living about a week ago. So far the new living arrangements are OK. Her son-in-law was here yesterday. She established care with Dr. Veneta Penton in rheumatology yesterday.  She plans to establish care with Dr. Veneta Penton for her small vessel vasculitis. She has been experiencing neuropathy and is to have some "tests". She is not walking much at home, using a motorized scooter. Her breathing has been OK, has been "pretty good".  At night she has a lot of sinus congestion and has a hard time "breathing out".  She attributes this to dry lips.  She coughs a lot at night. This exacerbates her incontinence.  She is using saline rinses every 12 hours or so.   She has seen Dr. Benjamine Mola and she is considering having a cochlear implant.    Past Medical History  Diagnosis Date  . Wegener's granulomatosis ([place])   . OA (osteoarthritis)   . Blindness of one eye   . Hearing loss in left ear     hearing aid both ears  . Bladder incontinence   . Glaucoma   . Clotting disorder ([place])   . History of pulmonary embolism 1997  . Shortness of breath dyspnea   . Pneumonia 12/15  . Multiple allergies   . Chronic sinusitis   . Acute respiratory failure with hypoxia ([place])   . Interstitial emphysema ([place])   . Aspergillosis ([place])   . Stroke [facility])     ??? blind left eye  . Cancer  ([place])     melanoma   ([place])  . Difficult intubation     Difficult airway with intubation for ARF 07/22/14 (due to anterior larynx, also had mucous plug)      Review of Systems  Constitutional: Positive for fatigue. Negative for fever.  HENT: Positive for postnasal drip, rhinorrhea and sinus pressure.   Respiratory: Positive for cough. Negative for shortness of breath and wheezing.   Cardiovascular: Negative for chest pain, palpitations and leg swelling.       Objective:   Physical Exam  Filed Vitals:   06/01/15 1401  BP: 120/66  Pulse: 83  Height: 5\' 1"  (1.549 m)  Weight: 125 lb (56.7 kg)  SpO2: 96%   RA  Gen: well appearing, no acute distress HENT: NCAT, OP clear,  Eyes: anicteric, EOMi Lymph: no cervical lymphadenopathy PULM: few rhonchi that clear with cough, otherwise clear CV: RRR, no mgr, no JVD GI: BS+, soft, nontender, no hsm Derm: no rash or skin breakdown MSK: normal bulk and tone Neuro: A&Ox4, CN II-XII intact, MAEW Psyche: normal mood and affect       Assessment & Plan:  WEGENERS GRANULOMATOSIS She has established care at [facility]. She will continue to follow-up with them  Intrinsic asthma Stable interval, flu shot is up-to-date Continue Advair  Follow-up 6 months  Pulmonary nodules She has benign-appearing nodules last seen on a CT chest in 2016. Again, I feel these are likely inflammatory. Plan: Repeat CT chest January 2017  Atypical mycobacterial disease NO recent evidence of recurrence     Current outpatient prescriptions:  .  b complex vitamins capsule, Take 1 capsule by mouth daily., Disp: , Rfl:  .  calcium-vitamin D (OSCAL WITH D) 500-200 MG-UNIT per tablet, Take 1 tablet by mouth daily. , Disp: , Rfl:  .  cholecalciferol (VITAMIN D) 1000 UNITS tablet, One daily for vitamin D supplement (Patient taking differently: Take 1,000 Units by mouth daily. ), Disp: 30 tablet, Rfl: 11 .  fluticasone (FLONASE) 50 MCG/ACT nasal  spray, Place 1 spray into both nostrils every evening. , Disp: , Rfl: 5 .  Fluticasone-Salmeterol (ADVAIR DISKUS) 500-50 MCG/DOSE AEPB, INHALE 1 PUFF EVERY 12 HOURS. (Patient taking differently: Inhale 1 puff into the lungs daily. ), Disp: 180 each, Rfl: 1 .  furosemide (LASIX) 20 MG tablet, Take 1 tablet (20 mg total) by mouth as needed (when weight increases 3 lbs overnight, 5 lbs in a week, or develops sob or LE edema)., Disp: 90 tablet, Rfl: 3 .  guaiFENesin (MUCINEX) 600 MG 12 hr tablet, Take 600 mg by mouth 2 (two) times daily., Disp: , Rfl:  .  latanoprost (XALATAN) 0.005 % ophthalmic solution, Place 1 drop into both eyes at bedtime., Disp: , Rfl:  .  Melatonin 5 MG TABS, Take 20 mg by mouth at bedtime., Disp: , Rfl:  .  Multiple Vitamins-Minerals (MULTIVITAMIN WITH MINERALS) tablet, Take 1 tablet by mouth daily., Disp: , Rfl:  .  oxyCODONE-acetaminophen (PERCOCET/ROXICET) 5-325 MG tablet, Take 1 tablet by mouth every 4 (four) hours as needed for severe pain., Disp: 15 tablet, Rfl: 0 .  pantoprazole (PROTONIX) 40 MG tablet, TAKE 1 TABLET TWICE DAILY. (Patient taking differently: TAKE 1 TABLET BY MOUTH TWICE DAILY.), Disp: 60 tablet, Rfl: 5 .  saccharomyces boulardii (FLORASTOR) 250 MG capsule, Take 250 mg by mouth 2 (two) times daily. For 10 days, Disp: , Rfl:  .  saccharomyces boulardii (FLORASTOR) 250 MG capsule, Take one capsule twice daily for 10 days, Disp: 20 capsule, Rfl: 0 .  sodium chloride (OCEAN) 0.65 % SOLN nasal spray, Place 1 spray into both nostrils every hour as needed for congestion. , Disp: , Rfl:  .  sulfamethoxazole-trimethoprim (BACTRIM DS,SEPTRA DS) 800-160 MG per tablet, Take 1 tablet by mouth every Monday, Wednesday, and Friday. HOLD WHILE ON AUGMENTIN, THEN RESUME AS PREVIOUS (Patient taking differently: Take 1 tablet by mouth every Monday, Wednesday, and Friday. ), Disp: , Rfl: 1 .  timolol (BETIMOL) 0.5 % ophthalmic solution, Place 1 drop into the left eye daily with  breakfast. , Disp: , Rfl:  .  warfarin (COUMADIN) 4 MG tablet, Take one tablet by mouth once daily for anticoagulation, Disp: 30 tablet, Rfl: 0 .  amoxicillin (AMOXIL) 500 MG capsule, Take one twice daily for 7 days for UTI (Patient not taking: Reported on 06/01/2015), Disp: 14 capsule, Rfl: 0

## 2015-06-01 NOTE — Assessment & Plan Note (Signed)
She has benign-appearing nodules last seen on a CT chest in 2016. Again, I feel these are likely inflammatory. Plan: Repeat CT chest January 2017

## 2015-06-01 NOTE — Assessment & Plan Note (Signed)
Stable interval, flu shot is up-to-date Continue Advair Follow-up 6 months

## 2015-06-01 NOTE — Patient Instructions (Signed)
We will order a CT scan of your chest in January to follow-up the pulmonary nodule and then we will call you with those results We will see you back in 6 months or sooner if needed

## 2015-06-10 ENCOUNTER — Encounter: Payer: Self-pay | Admitting: Internal Medicine

## 2015-06-10 ENCOUNTER — Non-Acute Institutional Stay: Payer: Medicare Other | Admitting: Internal Medicine

## 2015-06-10 VITALS — BP 130/70 | HR 88 | Temp 97.9°F | Ht 61.0 in | Wt 125.0 lb

## 2015-06-10 DIAGNOSIS — H9193 Unspecified hearing loss, bilateral: Secondary | ICD-10-CM

## 2015-06-10 DIAGNOSIS — R490 Dysphonia: Secondary | ICD-10-CM | POA: Diagnosis not present

## 2015-06-10 DIAGNOSIS — J329 Chronic sinusitis, unspecified: Secondary | ICD-10-CM

## 2015-06-10 DIAGNOSIS — R269 Unspecified abnormalities of gait and mobility: Secondary | ICD-10-CM | POA: Diagnosis not present

## 2015-06-10 DIAGNOSIS — M79671 Pain in right foot: Secondary | ICD-10-CM

## 2015-06-10 DIAGNOSIS — R609 Edema, unspecified: Secondary | ICD-10-CM | POA: Diagnosis not present

## 2015-06-10 DIAGNOSIS — L731 Pseudofolliculitis barbae: Secondary | ICD-10-CM | POA: Diagnosis not present

## 2015-06-10 DIAGNOSIS — M3130 Wegener's granulomatosis without renal involvement: Secondary | ICD-10-CM | POA: Diagnosis not present

## 2015-06-10 DIAGNOSIS — L678 Other hair color and hair shaft abnormalities: Secondary | ICD-10-CM | POA: Insufficient documentation

## 2015-06-10 NOTE — Progress Notes (Signed)
Patient ID: [name], female   DOB: [birth year], 81 y.o.   MRN: [id number]    HISTORY AND PHYSICAL  Location:  [facility] Room Number: [id number] Place of Service: Clinic [phone])   Extended Emergency Contact Information Primary Emergency Contact: Haygood,Sandra Address: [address] [place] [address] [place] of [place] Phone: [phone] Relation: Daughter Secondary Emergency Contact: Waltz,[name] Address: [address] [place]          [place], [place] [address] [place] Phone: [phone] Mobile Phone: [phone] Relation: Son  Advanced Directive information Does patient have an advance directive?: Yes, Type of Advance Directive: Healthcare Power of [place];Living will  Chief Complaint  Patient presents with  . Medical Management of Chronic Issues    Wegener's granulomatosis, anemia, edema, GERD. Moved to [place] 05/25/15  . dry hair  . ankle sore    off and on, history of ulcer on (R) ankle, ulcer is heal, but gets a pain some  . feet hurt    off and on when she walks    HPI:  Patient moved to assisted living 05/25/2015 and plans for this to be a permanent move.  Complains of edema. She is wearing compression stockings. She worries that it may be an indicated something worse is wrong with her.  Complains of dry hair. Hair is very brittle and strawlike and texture. She wonders if some of her medicines could be creating this problem.  Patient is having foot pains. She has known peripheral neuropathy. Foot pain sleep to an unstable gait. She is using a 4 wheel walker. She uses heating pads to help relieve the discomfort in her feet. She thinks a lot of her pain is related to arthritis rather than the peripheral neuropathy. She is wearing UGG slippers.  Impression and seemed to help lower her feet toenails. A rheumatologist suggested that she get physical therapy. He also possibly is considering a sural  nerve biopsy. Her rheumatologist is Dr. Veneta Penton in [place].  Previous ankle ulcer has healed at the right ankle.  Wegener's granulomatosis is doing reasonably well. She washes her sinuses out with saline each day.  Patient is seen Dr.Teoh, ENT. She has had some surgery and is being compared for cochlear implant. She is to see audiologist with the [facility].  Patient worries about her memory. States she has trouble with word finding.  Past Medical History  Diagnosis Date  . Wegener's granulomatosis ([place])   . OA (osteoarthritis)   . Blindness of one eye   . Hearing loss in left ear     hearing aid both ears  . Bladder incontinence   . Glaucoma   . Clotting disorder ([place])   . History of pulmonary embolism 1997  . Shortness of breath dyspnea   . Pneumonia 12/15  . Multiple allergies   . Chronic sinusitis   . Acute respiratory failure with hypoxia ([place])   . Interstitial emphysema ([place])   . Aspergillosis ([place])   . Stroke [facility])     ??? blind left eye  . Cancer ([place])     melanoma   (chemo)  . Difficult intubation     Difficult airway with intubation for ARF 07/22/14 (due to anterior larynx, also had mucous plug)    Past Surgical History  Procedure Laterality Date  . Tubal ligation    . Pubovaginal sling    . Cataract extraction    .  Melanoma excision      left leg  . Vena cava filter placement  1997    [place] filter  . Knee arthroscopy  2006    rt   . Inguinal hernia repair Left 02/17/2014    Procedure: LEFT INGUINAL HERNIA REPAIR WITH MESH;  Surgeon: Harl Bowie, MD;  Location: [facility];  Service: General;  Laterality: Left;  . Insertion of mesh N/A 02/17/2014    Procedure: INSERTION OF MESH;  Surgeon: Harl Bowie, MD;  Location: [facility];  Service: General;  Laterality: N/A;  . Tear duct probing    . Tympanomastoidectomy Left 04/28/2015  . Tympanomastoidectomy Left 04/28/2015    Procedure: LEFT TYMPANOMASTOIDECTOMY;   Surgeon: Leta Baptist, MD;  Location: [facility] OR;  Service: ENT;  Laterality: Left;    Patient Care Team: Estill Dooms, MD as PCP - General (Internal Medicine) Unice Bailey, MD as Consulting Physician (Rheumatology) Rozetta Nunnery, MD as Consulting Physician (Otolaryngology) Marcial Pacas, MD as Consulting Physician (Neurology) Michel Bickers, MD as Consulting Physician (Infectious Diseases) Volanda Napoleon, MD as Consulting Physician (Oncology) Juanito Doom, MD as Consulting Physician (Pulmonary Disease) Tanda Rockers, MD as Consulting Physician (Pulmonary Disease) Estill Dooms, MD as Consulting Physician (Geriatric Medicine) Coralie Keens, MD as Consulting Physician (General Surgery) Man Mast X, NP as Nurse Practitioner (Nurse Practitioner)  Social History   Social History  . Marital Status: Married    Spouse Name: N/A  . Number of Children: N/A  . Years of Education: N/A   Occupational History  . retired Education officer, [employer]    Social History Main Topics  . Smoking status: Former Smoker -- 1.50 packs/day for 30 years    Types: Cigarettes    Quit date: 07/03/1984  . Smokeless tobacco: Never Used     Comment: quit smoking 30 years ago  . Alcohol Use: No  . Drug Use: No  . Sexual Activity: Not on file   Other Topics Concern  . Not on file   Social History Narrative   Lives at [facility]   Former smoker stopped 1986   Alcohol occasional wine   Exercise PT 3 x a week   Has POA, Living Will       reports that she quit smoking about 30 years ago. Her smoking use included Cigarettes. She has a 45 pack-year smoking history. She has never used smokeless tobacco. She reports that she does not drink alcohol or use illicit drugs.  Family History  Problem Relation Age of Onset  . Heart disease Father   . CVA Father   . Congestive Heart Failure Mother   . Heart disease Mother   . Uterine cancer Sister   . Dementia Sister   . Skin  cancer Sister   . Alzheimer's disease Sister   . Osteoarthritis Sister   . Lymphoma Sister    Family Status  Relation Status Death Age  . Father Deceased     CVA  . Mother Deceased     CHF  . Sister Deceased     Lymphoma  . Sister Alive     Alzheimer's  . Sister Alive     Uterine CA  . Sister Alive     OA, skin cancer  . Daughter Alive   . Son Alive     Immunization History  Administered Date(s) Administered  . Influenza Split 03/04/2012, 03/11/2014, 04/01/2015  . Influenza,inj,Quad PF,36+ Mos 03/03/2013  .  Influenza-Unspecified 04/01/2015  . Pneumococcal Conjugate-13 07/03/2009  . Pneumococcal Polysaccharide-23 08/07/2014  . Zoster 09/30/1994    Allergies  Allergen Reactions  . Adhesive [Tape] Rash    Medications: Patient's Medications  New Prescriptions   No medications on file  Previous Medications   ACETAMINOPHEN (TYLENOL) 650 MG CR TABLET    Take 650 mg by mouth. Every 6 hours as needed for pain   AMOXICILLIN (AMOXIL) 500 MG CAPSULE    Take one twice daily for 7 days for UTI   B COMPLEX VITAMINS CAPSULE    Take 1 capsule by mouth daily.   CALCIUM-VITAMIN D (OSCAL WITH D) 500-200 MG-UNIT PER TABLET    Take 1 tablet by mouth daily.    CHOLECALCIFEROL (VITAMIN D) 1000 UNITS TABLET    One daily for vitamin D supplement   FLUTICASONE (FLONASE) 50 MCG/ACT NASAL SPRAY    Place 1 spray into both nostrils every evening.    FLUTICASONE-SALMETEROL (ADVAIR DISKUS) 500-50 MCG/DOSE AEPB    INHALE 1 PUFF EVERY 12 HOURS.   FLUVIRIN SUSP       FUROSEMIDE (LASIX) 20 MG TABLET    Take 1 tablet (20 mg total) by mouth as needed (when weight increases 3 lbs overnight, 5 lbs in a week, or develops sob or LE edema).   GABAPENTIN (NEURONTIN) 300 MG CAPSULE    Take 300 mg by mouth. Take one every night for 7 days , then twice daily for 7 days, then three times daily thereafter . Started 06/09/15 Dr. Veneta Penton   GUAIFENESIN (MUCINEX) 600 MG 12 HR TABLET    Take 600 mg by mouth 2 (two) times  daily.   LATANOPROST (XALATAN) 0.005 % OPHTHALMIC SOLUTION    Place 1 drop into both eyes at bedtime.   LEVALBUTEROL (XOPENEX) 0.31 MG/3ML NEBULIZER SOLUTION    Take 1 ampule by nebulization every 6 (six) hours as needed for wheezing.   LORAZEPAM (ATIVAN) 0.5 MG TABLET    Take 0.5 mg by mouth. Take one every 6 hours as needed for anxiety   MELATONIN 5 MG TABS    Take 20 mg by mouth at bedtime.   MULTIPLE VITAMINS-MINERALS (MULTIVITAMIN WITH MINERALS) TABLET    Take 1 tablet by mouth daily.   PANTOPRAZOLE (PROTONIX) 40 MG TABLET    TAKE 1 TABLET TWICE DAILY.   SACCHAROMYCES BOULARDII (FLORASTOR) 250 MG CAPSULE    Take 250 mg by mouth 2 (two) times daily. For 10 days   SULFAMETHOXAZOLE-TRIMETHOPRIM (BACTRIM DS,SEPTRA DS) 800-160 MG PER TABLET    Take 1 tablet by mouth every Monday, Wednesday, and Friday. HOLD WHILE ON AUGMENTIN, THEN RESUME AS PREVIOUS   TIMOLOL (BETIMOL) 0.5 % OPHTHALMIC SOLUTION    Place 1 drop into the left eye daily with breakfast.    WARFARIN (COUMADIN) 4 MG TABLET    Take one tablet by mouth once daily for anticoagulation  Modified Medications   No medications on file  Discontinued Medications   OXYCODONE-ACETAMINOPHEN (PERCOCET/ROXICET) 5-325 MG TABLET    Take 1 tablet by mouth every 4 (four) hours as needed for severe pain.   SACCHAROMYCES BOULARDII (FLORASTOR) 250 MG CAPSULE    Take one capsule twice daily for 10 days   SODIUM CHLORIDE (OCEAN) 0.65 % SOLN NASAL SPRAY    Place 1 spray into both nostrils every hour as needed for congestion.     Review of Systems  Constitutional: Negative for fever, chills and diaphoresis.       Frail and generally weak.  HENT: Positive for hearing loss (Deaf on left, hearing aid on right ). Negative for congestion, ear discharge, ear pain and tinnitus.   Eyes: Negative.  Negative for pain and redness.  Respiratory: Positive for cough and shortness of breath. Negative for wheezing (Intermittent).        History Wegener's granulomatosis.  History of chronic sinusitis related to her Wegener's.  Cardiovascular: Positive for leg swelling. Negative for chest pain and palpitations.       Trace R+L foot/ankle, R>L  Gastrointestinal: Negative for nausea, abdominal pain, diarrhea and constipation.       History of GERD.  Endocrine: Negative for polydipsia.  Genitourinary: Positive for frequency. Negative for dysuria, urgency, hematuria and flank pain.       Frequency induced by furosemide. Episodes of incontinence of urine.  Musculoskeletal: Negative for myalgias, back pain and neck pain.       Generalized weakness Right foot pain, worsens with walking  Skin: Negative.  Negative for rash.  Neurological: Negative for dizziness, tremors, seizures, weakness and headaches.       Neuropathic foot pain.   Hematological: Does not bruise/bleed easily.       Chronic anemia.  Psychiatric/Behavioral: Negative for suicidal ideas and hallucinations. The patient is nervous/anxious.        Recently her husband has been admitted to SNF    Filed Vitals:   06/10/15 1532  BP: 130/70  Pulse: 88  Temp: 97.9 F (36.6 C)  TempSrc: Oral  Height: _0  (1.549 m)  Weight: 125 lb (56.7 kg)  SpO2: 91%   Body mass index is 23.63 kg/(m^2).  Physical Exam  Constitutional: She is oriented to person, place, and time. She appears well-developed. No distress.  Thin and frail. Weight stable  since 3/16  HENT:  Head: Normocephalic and atraumatic.  Nose: Nose normal.  Mouth/Throat: Oropharynx is clear and moist. No oropharyngeal exudate.  Bilateral hearing loss. Hearing aid - right ear  Eyes: Conjunctivae and EOM are normal. Pupils are equal, round, and reactive to light. Right eye exhibits no discharge. Left eye exhibits no discharge. No scleral icterus.  Neck: Normal range of motion. Neck supple. No JVD present. No tracheal deviation present. No thyromegaly present.  Cardiovascular: Normal rate, regular rhythm, normal heart sounds and intact distal  pulses.   No murmur heard. Pulmonary/Chest: Effort normal. No stridor. No respiratory distress. She has no wheezes. She has no rales. She exhibits no tenderness.  Voice hoarse  Abdominal: Soft. Bowel sounds are normal. She exhibits no distension and no mass. There is no tenderness. There is no rebound and no guarding.  Musculoskeletal: Normal range of motion. She exhibits edema (trace). She exhibits no tenderness.  R+L foot/ankle, R>L  Lymphadenopathy:    She has no cervical adenopathy.  Neurological: She is alert and oriented to person, place, and time. She has normal reflexes. She displays normal reflexes. No cranial nerve deficit. She exhibits normal muscle tone. Coordination normal.  Skin: Skin is warm and dry. No rash noted. She is not diaphoretic. No erythema. No pallor.  Psychiatric: She has a normal mood and affect. Her behavior is normal. Judgment and thought content normal.    Labs reviewed: Lab Summary Latest Ref Rng 05/13/2015 04/20/2015  Hemoglobin 12.0 - 16.0 g/dL 10.2(A) 11.7(L)  Hematocrit 36 - 46 % 32(A) 36.3  White count - 5.2 5.8  Platelet count 150 - 399 K/L 344 311  Sodium 137 - 147 mmol/L 139 140  Potassium 3.4 - 5.3 mmol/L 4.0  4.2  Calcium 8.9 - 10.3 mg/dL (None) 10.0  Phosphorus - (None) (None)  Creatinine 0.5 - 1.1 mg/dL 0.7 0.79  AST 13 - 35 U/L 19 (None)  Alk Phos 25 - 125 U/L 108 (None)  Bilirubin - (None) (None)  Glucose - 89 104(H)  Cholesterol - (None) (None)  HDL cholesterol - (None) (None)  Triglycerides - (None) (None)  LDL Direct - (None) (None)  LDL Calc - (None) (None)  Total protein - (None) (None)  Albumin - (None) (None)   Lab Results  Component Value Date   BUN 12 05/13/2015   Lab Results  Component Value Date   HGBA1C 5.8* 07/28/2014   Lab Results  Component Value Date   TSH 1.56 05/13/2015   Assessment/Plan  1. Edema, unspecified type Trivial amount of edema which does not seem to be correlated to other more significant  problems. Advised patient to continue to work compression stockings  2. Deaf, bilateral Continue to work with Dr. Benjamine Mola  3. Abnormality of gait Continue use of walker and motorized chair as needed.  4. Hoarse Observe  5. Chronic sinusitis, unspecified location Associated with her Wegener's granulomatosis  6. Dry hair She is not using any drugs I can identify as likely to create a problem with dry hair  7. [place] with Dr. Ang  8. Right foot pain Multifactorial etiology of both peripheral neuropathy as well as arthritic. Patient will continue heating pads, foot padding, gabapentin, and pain medicines.

## 2015-06-21 ENCOUNTER — Other Ambulatory Visit: Payer: Self-pay | Admitting: Otolaryngology

## 2015-07-08 ENCOUNTER — Ambulatory Visit (INDEPENDENT_AMBULATORY_CARE_PROVIDER_SITE_OTHER)
Admission: RE | Admit: 2015-07-08 | Discharge: 2015-07-08 | Disposition: A | Discharge status: Home / Self Care | Payer: Medicare Other | Source: Ambulatory Visit | Attending: Pulmonary Disease | Admitting: Pulmonary Disease

## 2015-07-08 DIAGNOSIS — R918 Other nonspecific abnormal finding of lung field: Secondary | ICD-10-CM

## 2015-07-15 ENCOUNTER — Encounter: Payer: Self-pay | Admitting: Nurse Practitioner

## 2015-07-15 ENCOUNTER — Non-Acute Institutional Stay: Payer: Medicare Other | Admitting: Nurse Practitioner

## 2015-07-15 ENCOUNTER — Telehealth: Payer: Self-pay | Admitting: Pulmonary Disease

## 2015-07-15 DIAGNOSIS — K5900 Constipation, unspecified: Secondary | ICD-10-CM

## 2015-07-15 DIAGNOSIS — M3130 Wegener's granulomatosis without renal involvement: Secondary | ICD-10-CM | POA: Diagnosis not present

## 2015-07-15 DIAGNOSIS — F411 Generalized anxiety disorder: Secondary | ICD-10-CM

## 2015-07-15 DIAGNOSIS — Z86711 Personal history of pulmonary embolism: Secondary | ICD-10-CM | POA: Diagnosis not present

## 2015-07-15 DIAGNOSIS — M79671 Pain in right foot: Secondary | ICD-10-CM

## 2015-07-15 NOTE — Assessment & Plan Note (Addendum)
Chronic cough, continue Bactrim qod. Advair bid, Mucinex 600mg  bid, Flonase nightly.

## 2015-07-15 NOTE — Assessment & Plan Note (Signed)
Stable, continue Miralax.  

## 2015-07-15 NOTE — Telephone Encounter (Signed)
Spoke with pt, aware of results/recs.  Nothing further needed.  

## 2015-07-15 NOTE — Assessment & Plan Note (Signed)
Prn Lorazepam 

## 2015-07-15 NOTE — Assessment & Plan Note (Addendum)
03/09/15 Xray R foot 3 views: no fracture osseous pathology.  07/12/15 X-ray R knee, ankle, foot: no acute fracture, dislocation 07/15/15 able to walk with walker. Continue Tylenol 650mg  bid.

## 2015-07-15 NOTE — Telephone Encounter (Signed)
Per CT results: Notes Recorded by Juanito Doom, MD on 07/14/2015 at 7:14 PM A, Please let the patient know this was OK, all nodules smaller which is good Thanks, B ----  [id number] x1

## 2015-07-15 NOTE — Assessment & Plan Note (Signed)
Continue Coumadin, goal of INR 2-3  

## 2015-07-15 NOTE — Progress Notes (Signed)
Patient ID: [name], female   DOB: [birth year], 82 y.o.   MRN: [id number]  Location:  [facility] Provider:  Marlana Latus NP  Code Status:  DNR Goals of care: Advanced Directive information    Chief Complaint  Patient presents with  . Medical Management of Chronic Issues  . Acute Visit    R leg pain.      HPI: Patient is a 82 y.o. female seen in the [facility] at [facility] today for evaluation of GERD, chronic cough, peripheral neuropathy, constipation, anxiety.   Review of Systems  Constitutional: Negative for fever, chills and diaphoresis.       Frail and generally weak.  HENT: Positive for hearing loss (Deaf on left, hearing aid on right ). Negative for congestion, ear discharge, ear pain and tinnitus.   Eyes: Negative.  Negative for pain and redness.  Respiratory: Positive for cough and shortness of breath. Negative for wheezing (Intermittent).        History Wegener's granulomatosis. History of chronic sinusitis related to her Wegener's.  Cardiovascular: Positive for leg swelling. Negative for chest pain and palpitations.       Trace R+L foot/ankle, R>L  Gastrointestinal: Negative for nausea, abdominal pain, diarrhea and constipation.       History of GERD.  Genitourinary: Positive for frequency. Negative for dysuria, urgency, hematuria and flank pain.       Frequency induced by furosemide. Episodes of incontinence of urine.  Musculoskeletal: Negative for myalgias, back pain and neck pain.       Generalized weakness Right foot pain, worsens with walking  Skin: Negative.  Negative for rash.  Neurological: Negative for dizziness, tremors, seizures, weakness and headaches.       Neuropathic foot pain.   Endo/Heme/Allergies: Negative for polydipsia. Does not bruise/bleed easily.       Chronic anemia.  Psychiatric/Behavioral: Negative for suicidal ideas and hallucinations. The patient is nervous/anxious.        Recently her husband has been admitted to SNF    Past  Medical History  Diagnosis Date  . Wegener's granulomatosis ([place])   . OA (osteoarthritis)   . Blindness of one eye   . Hearing loss in left ear     hearing aid both ears  . Bladder incontinence   . Glaucoma   . Clotting disorder ([place])   . History of pulmonary embolism 1997  . Shortness of breath dyspnea   . Pneumonia 12/15  . Multiple allergies   . Chronic sinusitis   . Acute respiratory failure with hypoxia ([place])   . Interstitial emphysema ([place])   . Aspergillosis ([place])   . Stroke [facility])     ??? blind left eye  . Cancer ([place])     melanoma   (chemo)  . Difficult intubation     Difficult airway with intubation for ARF 07/22/14 (due to anterior larynx, also had mucous plug)    Patient Active Problem List   Diagnosis Date Noted  . Dry hair 06/10/2015  . History of tympanomastoidectomy 04/28/2015  . Right foot pain 03/11/2015  . 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 Pseudoaphakia 01/26/2015  . Generalized anxiety disorder 09/10/2014  . Chronic sinusitis 08/18/2014  . Pulmonary nodules 08/13/2014  . Atypical mycobacterial disease 08/13/2014  . Edema 08/10/2014  . History of aspergillosis 08/10/2014  . Deaf 08/10/2014  . Urine incontinence 08/10/2014  . Weak 08/10/2014  . Abnormality of gait 08/10/2014  . Scoliosis (and kyphoscoliosis), idiopathic 08/10/2014  . Long term current  use of anticoagulant therapy 08/10/2014  . Cardiomegaly 08/10/2014  . Hoarse 08/10/2014  . Vitamin D deficiency 08/10/2014  . Anemia of chronic disease 08/03/2014  . GERD (gastroesophageal reflux disease) 08/03/2014  . Constipation 08/03/2014  . Personal history of PE (pulmonary embolism) 08/03/2014  . Dyspnea 07/20/2014  . Left inguinal hernia 11/05/2013  . Primary open angle glaucoma 08/11/2013  . Lacrimal canalicular stenosis 123XX123  . Pneumonia in aspergillosis ([place]) 05/16/2011  . Melanoma in situ ([place]) 05/16/2011  . H/O infectious disease 05/16/2011  . Mononeuropathy of both lower  extremities 05/16/2011  . Stress incontinence 05/16/2011  . Unqualified visual loss of left eye with normal vision of contralateral eye 05/16/2011  . WEGENERS GRANULOMATOSIS 05/22/2007  . Intrinsic asthma 05/22/2007  . Sinusitis, chronic 05/21/2007    Allergies  Allergen Reactions  . Adhesive [Tape] Rash    Medications: Patient's Medications  New Prescriptions   No medications on file  Previous Medications   ACETAMINOPHEN (TYLENOL) 650 MG CR TABLET    Take 650 mg by mouth. Every 6 hours as needed for pain   AMOXICILLIN (AMOXIL) 500 MG CAPSULE    Take one twice daily for 7 days for UTI   B COMPLEX VITAMINS CAPSULE    Take 1 capsule by mouth daily.   CALCIUM-VITAMIN D (OSCAL WITH D) 500-200 MG-UNIT PER TABLET    Take 1 tablet by mouth daily.    CHOLECALCIFEROL (VITAMIN D) 1000 UNITS TABLET    One daily for vitamin D supplement   FLUTICASONE (FLONASE) 50 MCG/ACT NASAL SPRAY    Place 1 spray into both nostrils every evening.    FLUTICASONE-SALMETEROL (ADVAIR DISKUS) 500-50 MCG/DOSE AEPB    INHALE 1 PUFF EVERY 12 HOURS.   FLUVIRIN SUSP       FUROSEMIDE (LASIX) 20 MG TABLET    Take 1 tablet (20 mg total) by mouth as needed (when weight increases 3 lbs overnight, 5 lbs in a week, or develops sob or LE edema).   GABAPENTIN (NEURONTIN) 300 MG CAPSULE    Take 300 mg by mouth. Take one every night for 7 days , then twice daily for 7 days, then three times daily thereafter . Started 06/09/15 Dr. Veneta Penton   GUAIFENESIN (MUCINEX) 600 MG 12 HR TABLET    Take 600 mg by mouth 2 (two) times daily.   LATANOPROST (XALATAN) 0.005 % OPHTHALMIC SOLUTION    Place 1 drop into both eyes at bedtime.   LEVALBUTEROL (XOPENEX) 0.31 MG/3ML NEBULIZER SOLUTION    Take 1 ampule by nebulization every 6 (six) hours as needed for wheezing.   LORAZEPAM (ATIVAN) 0.5 MG TABLET    Take 0.5 mg by mouth. Take one every 6 hours as needed for anxiety   MELATONIN 5 MG TABS    Take 20 mg by mouth at bedtime.   MULTIPLE  VITAMINS-MINERALS (MULTIVITAMIN WITH MINERALS) TABLET    Take 1 tablet by mouth daily.   PANTOPRAZOLE (PROTONIX) 40 MG TABLET    TAKE 1 TABLET TWICE DAILY.   SACCHAROMYCES BOULARDII (FLORASTOR) 250 MG CAPSULE    Take 250 mg by mouth 2 (two) times daily. For 10 days   SULFAMETHOXAZOLE-TRIMETHOPRIM (BACTRIM DS,SEPTRA DS) 800-160 MG PER TABLET    Take 1 tablet by mouth every Monday, Wednesday, and Friday. HOLD WHILE ON AUGMENTIN, THEN RESUME AS PREVIOUS   TIMOLOL (BETIMOL) 0.5 % OPHTHALMIC SOLUTION    Place 1 drop into the left eye daily with breakfast.    WARFARIN (COUMADIN) 4 MG TABLET  Take one tablet by mouth once daily for anticoagulation  Modified Medications   No medications on file  Discontinued Medications   No medications on file    Physical Exam: Filed Vitals:   07/15/15 1245  BP: 142/70  Pulse: 68  Temp: 98.1 F (36.7 C)  TempSrc: Tympanic  Resp: 18   There is no weight on file to calculate BMI.  Physical Exam  Constitutional: She is oriented to person, place, and time. She appears well-developed. No distress.  Thin and frail. Weight stable  since 3/16  HENT:  Head: Normocephalic and atraumatic.  Nose: Nose normal.  Mouth/Throat: Oropharynx is clear and moist. No oropharyngeal exudate.  Bilateral hearing loss. Hearing aid - right ear  Eyes: Conjunctivae and EOM are normal. Pupils are equal, round, and reactive to light. Right eye exhibits no discharge. Left eye exhibits no discharge. No scleral icterus.  Neck: Normal range of motion. Neck supple. No JVD present. No tracheal deviation present. No thyromegaly present.  Cardiovascular: Normal rate, regular rhythm, normal heart sounds and intact distal pulses.   No murmur heard. Pulmonary/Chest: Effort normal. No stridor. No respiratory distress. She has no wheezes. She has no rales. She exhibits no tenderness.  Voice hoarse  Abdominal: Soft. Bowel sounds are normal. She exhibits no distension and no mass. There is no  tenderness. There is no rebound and no guarding.  Musculoskeletal: Normal range of motion. She exhibits edema (trace). She exhibits no tenderness.  R+L foot/ankle, R>L  Lymphadenopathy:    She has no cervical adenopathy.  Neurological: She is alert and oriented to person, place, and time. She has normal reflexes. No cranial nerve deficit. She exhibits normal muscle tone. Coordination normal.  Skin: Skin is warm and dry. No rash noted. She is not diaphoretic. No erythema. No pallor.  Corns on the left foot at the tip of the third toe and at the fifth metacarpal phalangeal joint slightly lateral.   Psychiatric: She has a normal mood and affect. Her behavior is normal. Judgment and thought content normal.  Emotional when voice her worry not able to walk to SNF to visit her busband.     Labs reviewed: Basic Metabolic Panel:  Recent Labs  07/20/14 1713  07/28/14 0545  08/15/14 0537 08/16/14 0341 04/20/15 1409 05/13/15  NA  --   < > 141  < > 139 140 140 139  K  --   < > 3.6  < > 3.5 3.7 4.2 4.0  CL  --   < > 108  < > 103 105 106  --   CO2  --   < > 28  --  28 25 28   --   GLUCOSE  --   < > 97  < > 95 88 104*  --   BUN  --   < > 23  < > 30* 27* 12 12  CREATININE  --   < > 0.63  < > 1.14* 0.94 0.79 0.7  CALCIUM  --   < > 8.3*  --  8.9 9.6 10.0  --   MG 2.0  --  2.1  --   --   --   --   --   < > = values in this interval not displayed.  Liver Function Tests:  Recent Labs  07/23/14 0400 07/26/14 0530 07/28/14 0545 08/01/14 05/13/15  AST 36 36 23 22 19   ALT 23 43* 39* 31 11  ALKPHOS 97 90 82 90 108  BILITOT  0.4 0.5 0.6  --   --   PROT 5.0* 5.0* 4.6*  --   --   ALBUMIN 2.3* 2.3* 2.2*  --   --     CBC:  Recent Labs  09/22/14 1049  11/23/14 1126 02/03/15 1335 04/20/15 1409 05/13/15  WBC 6.0  < > 5.6 5.1 5.8 5.2  NEUTROABS 4.2  --  3.9 3.4  --   --   HGB 12.5  < > 12.8 12.3 11.7* 10.2*  HCT 39.1  < > 38.5 37.0 36.3 32*  MCV 97  < > 96 96 90.8  --   PLT 242  --  345 302  311 344  < > = values in this interval not displayed.  Lab Results  Component Value Date   TSH 1.56 05/13/2015   Lab Results  Component Value Date   HGBA1C 5.8* 07/28/2014   No results found for: CHOL, HDL, LDLCALC, LDLDIRECT, TRIG, CHOLHDL  Significant Diagnostic Results since last visit: none  Patient Care Team: Estill Dooms, MD as PCP - General (Internal Medicine) Unice Bailey, MD as Consulting Physician (Rheumatology) Rozetta Nunnery, MD as Consulting Physician (Otolaryngology) Marcial Pacas, MD as Consulting Physician (Neurology) Michel Bickers, MD as Consulting Physician (Infectious Diseases) Volanda Napoleon, MD as Consulting Physician (Oncology) Juanito Doom, MD as Consulting Physician (Pulmonary Disease) Tanda Rockers, MD as Consulting Physician (Pulmonary Disease) Estill Dooms, MD as Consulting Physician (Geriatric Medicine) Coralie Keens, MD as Consulting Physician (General Surgery) Man Mast X, NP as Nurse Practitioner (Nurse Practitioner)  Assessment/Plan Problem List Items Addressed This Visit    WEGENERS GRANULOMATOSIS (Chronic)    Chronic cough, continue Bactrim qod. Advair bid, Mucinex 600mg  bid, Flonase nightly.       Right foot pain - Primary    03/09/15 Xray R foot 3 views: no fracture osseous pathology.  07/12/15 X-ray R knee, ankle, foot: no acute fracture, dislocation 07/15/15 able to walk with walker. Continue Tylenol 650mg  bid.       Personal history of PE (pulmonary embolism) (Chronic)    Continue Coumadin, goal of INR 2-3      Generalized anxiety disorder    Prn Lorazepam.       Constipation (Chronic)    Stable, continue Miralax.           Family/ staff Communication: PT to eval and tx, observe the patient.   Labs/tests ordered: X-ray R foot, ankle, knee.   [facility] Mast NP Geriatrics [facility] [phone], [place] [address] On Call:  [phone] & follow prompts after 5pm  & weekends Office Phone:  [phone] Office Fax:  [phone]

## 2015-07-15 NOTE — Telephone Encounter (Signed)
Patient returned call, CB [phone]

## 2015-07-22 DIAGNOSIS — H35379 Puckering of macula, unspecified eye: Secondary | ICD-10-CM | POA: Insufficient documentation

## 2015-07-25 LAB — BASIC METABOLIC PANEL
BUN: 14 mg/dL (ref 4–21)
Creatinine: 0.6 mg/dL (ref 0.5–1.1)
GLUCOSE: 111 mg/dL
POTASSIUM: 4.2 mmol/L (ref 3.4–5.3)
SODIUM: 135 mmol/L — AB (ref 137–147)

## 2015-07-25 LAB — CBC AND DIFFERENTIAL
HCT: 28 % — AB (ref 36–46)
HEMOGLOBIN: 9.2 g/dL — AB (ref 12.0–16.0)
Platelets: 356 10*3/uL (ref 150–399)
WBC: 7.3 10^3/mL

## 2015-07-25 LAB — HEPATIC FUNCTION PANEL
ALK PHOS: 178 U/L — AB (ref 25–125)
ALT: 14 U/L (ref 7–35)
AST: 22 U/L (ref 13–35)
Bilirubin, Total: 0.5 mg/dL

## 2015-07-25 NOTE — Pre-Procedure Instructions (Signed)
    [name]  07/25/2015      [facility] - [place] [place] [address] Phone: [phone] Fax: [phone]    Your procedure is scheduled on 08/04/15.  Report to [facility] at 630 A.M.  Call this number if you have problems the morning of surgery:  [phone]   Remember:  Do not eat food or drink liquids after midnight.  Take these medicines the morning of surgery with A SIP OF WATER --tylenol,flonase,neurontin,ativan,protonix   Do not wear jewelry, make-up or nail polish.  Do not wear lotions, powders, or perfumes.  You may wear deodorant.  Do not shave 48 hours prior to surgery.  Men may shave face and neck.  Do not bring valuables to the hospital.  [facility] is not responsible for any belongings or valuables.  Contacts, dentures or bridgework may not be worn into surgery.  Leave your suitcase in the car.  After surgery it may be brought to your room.  For patients admitted to the hospital, discharge time will be determined by your treatment team.  Patients discharged the day of surgery will not be allowed to drive home.   Name and phone number of your driver:   Special instructions:    Please read over the following fact sheets that you were given. Pain Booklet, Coughing and Deep Breathing and Surgical Site Infection Prevention

## 2015-07-26 ENCOUNTER — Encounter (HOSPITAL_COMMUNITY): Payer: Self-pay | Admitting: Vascular Surgery

## 2015-07-26 ENCOUNTER — Inpatient Hospital Stay (HOSPITAL_COMMUNITY)
Admission: RE | Admit: 2015-07-26 | Discharge: 2015-07-26 | Disposition: A | Discharge status: Home / Self Care | Payer: Medicare Other | Source: Ambulatory Visit

## 2015-07-26 ENCOUNTER — Non-Acute Institutional Stay: Payer: Medicare Other | Admitting: Nurse Practitioner

## 2015-07-26 ENCOUNTER — Encounter: Payer: Self-pay | Admitting: Nurse Practitioner

## 2015-07-26 DIAGNOSIS — Z86711 Personal history of pulmonary embolism: Secondary | ICD-10-CM | POA: Diagnosis not present

## 2015-07-26 DIAGNOSIS — K5900 Constipation, unspecified: Secondary | ICD-10-CM | POA: Diagnosis not present

## 2015-07-26 DIAGNOSIS — M3130 Wegener's granulomatosis without renal involvement: Secondary | ICD-10-CM | POA: Diagnosis not present

## 2015-07-26 DIAGNOSIS — D638 Anemia in other chronic diseases classified elsewhere: Secondary | ICD-10-CM

## 2015-07-26 DIAGNOSIS — G5793 Unspecified mononeuropathy of bilateral lower limbs: Secondary | ICD-10-CM

## 2015-07-26 DIAGNOSIS — K219 Gastro-esophageal reflux disease without esophagitis: Secondary | ICD-10-CM | POA: Diagnosis not present

## 2015-07-26 DIAGNOSIS — F411 Generalized anxiety disorder: Secondary | ICD-10-CM

## 2015-07-26 DIAGNOSIS — M79671 Pain in right foot: Secondary | ICD-10-CM | POA: Diagnosis not present

## 2015-07-26 NOTE — Assessment & Plan Note (Addendum)
Stable, continue Pantoprazole 40mg bid.  

## 2015-07-26 NOTE — Assessment & Plan Note (Signed)
03/09/15 Xray R foot 3 views: no fracture osseous pathology.  07/12/15 X-ray R knee, ankle, foot: no acute fracture, dislocation 07/15/15 able to walk with walker. Continue Tylenol 650mg  bid.  07/26/15 much improved, able to ambulate with walker.

## 2015-07-26 NOTE — Assessment & Plan Note (Signed)
Prn Lorazepam 

## 2015-07-26 NOTE — Assessment & Plan Note (Signed)
Stable, continue Miralax.  

## 2015-07-26 NOTE — Assessment & Plan Note (Addendum)
chronic cough, worsened, congestive, yellow phlegm, T 102.8, CXR 07/25/15 no acute cardiopulmonary process, total 14 day course of Augmentin 875mg  bid started. Adding Medrol dose pk and Duoneb tid x 7days. Continue Bactrim. H/o cavitary pulmonary densities. Continue Advair bid, Mucinex bid

## 2015-07-26 NOTE — Assessment & Plan Note (Signed)
Continue Coumadin, goal of INR 2-3  

## 2015-07-26 NOTE — Progress Notes (Signed)
Patient ID: [name], female   DOB: 02-Oct-1932, 82 y.o.   MRN: [id number]  Location:  [facility] Provider:  Marlana Latus NP  Code Status:  DNR Goals of care: Advanced Directive information    Chief Complaint  Patient presents with  . Medical Management of Chronic Issues  . Acute Visit    congestive cough, T 102.8     HPI: Patient is a 82 y.o. female seen in the [facility] at [facility] today for evaluation of GERD, chronic cough, worsened, congestive, yellow phlegm, T 102.8, CXR 07/25/15 no acute cardiopulmonary process, 7 day course of Augmentin 875mg  bid started. peripheral neuropathy, constipation, anxiety.   Review of Systems  Constitutional: Positive for fever. Negative for chills and diaphoresis.       Frail and generally weak.  HENT: Positive for hearing loss (Deaf on left, hearing aid on right ). Negative for congestion, ear discharge, ear pain and tinnitus.   Eyes: Negative.  Negative for pain and redness.  Respiratory: Positive for cough, shortness of breath and wheezing (Intermittent).        History Wegener's granulomatosis. History of chronic sinusitis related to her Wegener's.  Cardiovascular: Positive for leg swelling. Negative for chest pain and palpitations.       Trace R+L foot/ankle, R>L  Gastrointestinal: Negative for nausea, abdominal pain, diarrhea and constipation.       History of GERD.  Genitourinary: Positive for frequency. Negative for dysuria, urgency, hematuria and flank pain.       Frequency induced by furosemide. Episodes of incontinence of urine.  Musculoskeletal: Negative for myalgias, back pain and neck pain.       Generalized weakness Right foot pain, worsens with walking  Skin: Negative.  Negative for rash.  Neurological: Negative for dizziness, tremors, seizures, weakness and headaches.       Neuropathic foot pain.   Endo/Heme/Allergies: Negative for polydipsia. Does not bruise/bleed easily.       Chronic anemia.  Psychiatric/Behavioral:  Negative for suicidal ideas and hallucinations. The patient is nervous/anxious.        Recently her husband has been admitted to SNF    Past Medical History  Diagnosis Date  . Wegener's granulomatosis ([place])   . OA (osteoarthritis)   . Blindness of one eye   . Hearing loss in left ear     hearing aid both ears  . Bladder incontinence   . Glaucoma   . Clotting disorder ([place])   . History of pulmonary embolism 1997  . Shortness of breath dyspnea   . Pneumonia 12/15  . Multiple allergies   . Chronic sinusitis   . Acute respiratory failure with hypoxia ([place])   . Interstitial emphysema ([place])   . Aspergillosis ([place])   . Stroke [facility])     ??? blind left eye  . Cancer ([place])     melanoma   (chemo)  . Difficult intubation     Difficult airway with intubation for ARF 07/22/14 (due to anterior larynx, also had mucous plug)    Patient Active Problem List   Diagnosis Date Noted  . Dry hair 06/10/2015  . History of tympanomastoidectomy 04/28/2015  . Right foot pain 03/11/2015  . 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 Pseudoaphakia 01/26/2015  . Generalized anxiety disorder 09/10/2014  . Chronic sinusitis 08/18/2014  . Pulmonary nodules 08/13/2014  . Atypical mycobacterial disease 08/13/2014  . Edema 08/10/2014  . History of aspergillosis 08/10/2014  . Deaf 08/10/2014  . Urine incontinence 08/10/2014  .  Weak 08/10/2014  . Abnormality of gait 08/10/2014  . Scoliosis (and kyphoscoliosis), idiopathic 08/10/2014  . Long term current use of anticoagulant therapy 08/10/2014  . Cardiomegaly 08/10/2014  . Hoarse 08/10/2014  . Vitamin D deficiency 08/10/2014  . Anemia of chronic disease 08/03/2014  . GERD (gastroesophageal reflux disease) 08/03/2014  . Constipation 08/03/2014  . Personal history of PE (pulmonary embolism) 08/03/2014  . Dyspnea 07/20/2014  . Left inguinal hernia 11/05/2013  . Primary open angle glaucoma 08/11/2013  . Lacrimal canalicular stenosis [id number]  . Pneumonia in  aspergillosis ([place]) 05/16/2011  . Melanoma in situ ([place]) 05/16/2011  . H/O infectious disease 05/16/2011  . Mononeuropathy of both lower extremities 05/16/2011  . Stress incontinence 05/16/2011  . Unqualified visual loss of left eye with normal vision of contralateral eye 05/16/2011  . WEGENERS GRANULOMATOSIS 05/22/2007  . Intrinsic asthma 05/22/2007  . Sinusitis, chronic 05/21/2007    Allergies  Allergen Reactions  . Adhesive [Tape] Rash    Medications: Patient's Medications  New Prescriptions   No medications on file  Previous Medications   ACETAMINOPHEN (TYLENOL) 650 MG CR TABLET    Take 650 mg by mouth. Every 6 hours as needed for pain   AMOXICILLIN (AMOXIL) 500 MG CAPSULE    Take one twice daily for 7 days for UTI   B COMPLEX VITAMINS CAPSULE    Take 1 capsule by mouth daily.   CALCIUM-VITAMIN D (OSCAL WITH D) 500-200 MG-UNIT PER TABLET    Take 1 tablet by mouth daily.    CHOLECALCIFEROL (VITAMIN D) 1000 UNITS TABLET    One daily for vitamin D supplement   FLUTICASONE (FLONASE) 50 MCG/ACT NASAL SPRAY    Place 1 spray into both nostrils every evening.    FLUTICASONE-SALMETEROL (ADVAIR DISKUS) 500-50 MCG/DOSE AEPB    INHALE 1 PUFF EVERY 12 HOURS.   FLUVIRIN SUSP       FUROSEMIDE (LASIX) 20 MG TABLET    Take 1 tablet (20 mg total) by mouth as needed (when weight increases 3 lbs overnight, 5 lbs in a week, or develops sob or LE edema).   GABAPENTIN (NEURONTIN) 300 MG CAPSULE    Take 300 mg by mouth. Take one every night for 7 days , then twice daily for 7 days, then three times daily thereafter . Started 06/09/15 Dr. Veneta Penton   GUAIFENESIN (MUCINEX) 600 MG 12 HR TABLET    Take 600 mg by mouth 2 (two) times daily.   LATANOPROST (XALATAN) 0.005 % OPHTHALMIC SOLUTION    Place 1 drop into both eyes at bedtime.   LEVALBUTEROL (XOPENEX) 0.31 MG/3ML NEBULIZER SOLUTION    Take 1 ampule by nebulization every 6 (six) hours as needed for wheezing.   LORAZEPAM (ATIVAN) 0.5 MG TABLET    Take 0.5 mg  by mouth. Take one every 6 hours as needed for anxiety   MELATONIN 5 MG TABS    Take 20 mg by mouth at bedtime.   MULTIPLE VITAMINS-MINERALS (MULTIVITAMIN WITH MINERALS) TABLET    Take 1 tablet by mouth daily.   PANTOPRAZOLE (PROTONIX) 40 MG TABLET    TAKE 1 TABLET TWICE DAILY.   SACCHAROMYCES BOULARDII (FLORASTOR) 250 MG CAPSULE    Take 250 mg by mouth 2 (two) times daily. For 10 days   SULFAMETHOXAZOLE-TRIMETHOPRIM (BACTRIM DS,SEPTRA DS) 800-160 MG PER TABLET    Take 1 tablet by mouth every Monday, Wednesday, and Friday. HOLD WHILE ON AUGMENTIN, THEN RESUME AS PREVIOUS   TIMOLOL (BETIMOL) 0.5 % OPHTHALMIC SOLUTION  Place 1 drop into the left eye daily with breakfast.    WARFARIN (COUMADIN) 4 MG TABLET    Take one tablet by mouth once daily for anticoagulation  Modified Medications   No medications on file  Discontinued Medications   No medications on file    Physical Exam: Filed Vitals:   07/26/15 1430  BP: 130/58  Pulse: 120  Temp: 102.7 F (39.3 C)  TempSrc: Tympanic  Resp: 24   There is no weight on file to calculate BMI.  Physical Exam  Constitutional: She is oriented to person, place, and time. She appears well-developed. No distress.  Thin and frail. Weight stable  since 3/16  HENT:  Head: Normocephalic and atraumatic.  Nose: Nose normal.  Mouth/Throat: Oropharynx is clear and moist. No oropharyngeal exudate.  Bilateral hearing loss. Hearing aid - right ear  Eyes: Conjunctivae and EOM are normal. Pupils are equal, round, and reactive to light. Right eye exhibits no discharge. Left eye exhibits no discharge. No scleral icterus.  Neck: Normal range of motion. Neck supple. No JVD present. No tracheal deviation present. No thyromegaly present.  Cardiovascular: Normal rate, regular rhythm, normal heart sounds and intact distal pulses.   No murmur heard. Pulmonary/Chest: Effort normal. No stridor. No respiratory distress. She has wheezes. She has rales. She exhibits no  tenderness.  Voice hoarse  Abdominal: Soft. Bowel sounds are normal. She exhibits no distension and no mass. There is no tenderness. There is no rebound and no guarding.  Musculoskeletal: Normal range of motion. She exhibits edema (trace). She exhibits no tenderness.  R+L foot/ankle, R>L  Lymphadenopathy:    She has no cervical adenopathy.  Neurological: She is alert and oriented to person, place, and time. She has normal reflexes. No cranial nerve deficit. She exhibits normal muscle tone. Coordination normal.  Skin: Skin is warm and dry. No rash noted. She is not diaphoretic. No erythema. No pallor.  Corns on the left foot at the tip of the third toe and at the fifth metacarpal phalangeal joint slightly lateral.   Psychiatric: She has a normal mood and affect. Her behavior is normal. Judgment and thought content normal.  Emotional when voice her worry not able to walk to SNF to visit her busband.     Labs reviewed: Basic Metabolic Panel:  Recent Labs  07/28/14 0545  08/15/14 0537 08/16/14 0341 04/20/15 1409 05/13/15 07/25/15  NA 141  < > 139 140 140 139 135*  K 3.6  < > 3.5 3.7 4.2 4.0 4.2  CL 108  < > 103 105 106  --   --   CO2 28  --  28 25 28   --   --   GLUCOSE 97  < > 95 88 104*  --   --   BUN 23  < > 30* 27* 12 12 14   CREATININE 0.63  < > 1.14* 0.94 0.79 0.7 0.6  CALCIUM 8.3*  --  8.9 9.6 10.0  --   --   MG 2.1  --   --   --   --   --   --   < > = values in this interval not displayed.  Liver Function Tests:  Recent Labs  07/28/14 0545 08/01/14 05/13/15 07/25/15  AST 23 22 19 22   ALT 39* 31 11 14   ALKPHOS 82 90 108 178*  BILITOT 0.6  --   --   --   PROT 4.6*  --   --   --  ALBUMIN 2.2*  --   --   --     CBC:  Recent Labs  09/22/14 1049  11/23/14 1126 02/03/15 1335 04/20/15 1409 05/13/15 07/25/15  WBC 6.0  < > 5.6 5.1 5.8 5.2 7.3  NEUTROABS 4.2  --  3.9 3.4  --   --   --   HGB 12.5  < > 12.8 12.3 11.7* 10.2* 9.2*  HCT 39.1  < > 38.5 37.0 36.3 32* 28*    MCV 97  < > 96 96 90.8  --   --   PLT 242  --  345 302 311 344 356  < > = values in this interval not displayed.  Lab Results  Component Value Date   TSH 1.56 05/13/2015   Lab Results  Component Value Date   HGBA1C 5.8* 07/28/2014   No results found for: CHOL, HDL, LDLCALC, LDLDIRECT, TRIG, CHOLHDL  Significant Diagnostic Results since last visit: none  Patient Care Team: Estill Dooms, MD as PCP - General (Internal Medicine) Unice Bailey, MD as Consulting Physician (Rheumatology) Rozetta Nunnery, MD as Consulting Physician (Otolaryngology) Marcial Pacas, MD as Consulting Physician (Neurology) Michel Bickers, MD as Consulting Physician (Infectious Diseases) Volanda Napoleon, MD as Consulting Physician (Oncology) Juanito Doom, MD as Consulting Physician (Pulmonary Disease) Tanda Rockers, MD as Consulting Physician (Pulmonary Disease) Estill Dooms, MD as Consulting Physician (Geriatric Medicine) Coralie Keens, MD as Consulting Physician (General Surgery) Man Mast X, NP as Nurse Practitioner (Nurse Practitioner)  Assessment/Plan Problem List Items Addressed This Visit    WEGENERS GRANULOMATOSIS - Primary (Chronic)    chronic cough, worsened, congestive, yellow phlegm, T 102.8, CXR 07/25/15 no acute cardiopulmonary process, total 14 day course of Augmentin 875mg  bid started. Adding Medrol dose pk and Duoneb tid x 7days. Continue Bactrim. H/o cavitary pulmonary densities. Continue Advair bid, Mucinex bid      Right foot pain    03/09/15 Xray R foot 3 views: no fracture osseous pathology.  07/12/15 X-ray R knee, ankle, foot: no acute fracture, dislocation 07/15/15 able to walk with walker. Continue Tylenol 650mg  bid.  07/26/15 much improved, able to ambulate with walker.       Personal history of PE (pulmonary embolism) (Chronic)    Continue Coumadin, goal of INR 2-3      Mononeuropathy of both lower extremities    Pain is managed with Gabapentin 300mg  bid.        GERD (gastroesophageal reflux disease)    Stable, continue Pantoprazole 40mg  bid      Generalized anxiety disorder    Prn Lorazepam.       Constipation (Chronic)    Stable, continue Miralax.       Anemia of chronic disease (Chronic)    07/25/15 Hgb 9.2, no apparent bleeding. Continue to monitor the patient, CBC 2 weeks.           Family/ staff Communication: PT to eval and tx, observe the patient.   Labs/tests ordered: CXR, CBC, CMP done 07/25/15, repeat CBC in 2 weeks  [facility] Mast NP [facility] [address], [place] [address] On Call:  [phone] & follow prompts after 5pm & weekends Office Phone:  [phone] Office Fax:  [phone]

## 2015-07-26 NOTE — Assessment & Plan Note (Signed)
07/25/15 Hgb 9.2, no apparent bleeding. Continue to monitor the patient, CBC 2 weeks.

## 2015-07-27 NOTE — Assessment & Plan Note (Signed)
Pain is managed with Gabapentin 300mg  bid.

## 2015-07-30 ENCOUNTER — Encounter (HOSPITAL_COMMUNITY)
Admission: RE | Admit: 2015-07-30 | Discharge: 2015-07-30 | Disposition: A | Discharge status: Home / Self Care | Payer: Medicare Other | Source: Ambulatory Visit | Attending: Otolaryngology | Admitting: Otolaryngology

## 2015-07-30 ENCOUNTER — Encounter (HOSPITAL_COMMUNITY): Payer: Self-pay

## 2015-07-30 DIAGNOSIS — I5030 Unspecified diastolic (congestive) heart failure: Secondary | ICD-10-CM | POA: Diagnosis not present

## 2015-07-30 DIAGNOSIS — Z7901 Long term (current) use of anticoagulants: Secondary | ICD-10-CM | POA: Diagnosis not present

## 2015-07-30 DIAGNOSIS — Z8701 Personal history of pneumonia (recurrent): Secondary | ICD-10-CM | POA: Diagnosis not present

## 2015-07-30 DIAGNOSIS — M3130 Wegener's granulomatosis without renal involvement: Secondary | ICD-10-CM | POA: Diagnosis not present

## 2015-07-30 DIAGNOSIS — R05 Cough: Secondary | ICD-10-CM | POA: Insufficient documentation

## 2015-07-30 DIAGNOSIS — Z86711 Personal history of pulmonary embolism: Secondary | ICD-10-CM | POA: Insufficient documentation

## 2015-07-30 DIAGNOSIS — Z79899 Other long term (current) drug therapy: Secondary | ICD-10-CM | POA: Diagnosis not present

## 2015-07-30 DIAGNOSIS — H9192 Unspecified hearing loss, left ear: Secondary | ICD-10-CM | POA: Diagnosis not present

## 2015-07-30 DIAGNOSIS — Z87891 Personal history of nicotine dependence: Secondary | ICD-10-CM | POA: Insufficient documentation

## 2015-07-30 DIAGNOSIS — J439 Emphysema, unspecified: Secondary | ICD-10-CM | POA: Insufficient documentation

## 2015-07-30 DIAGNOSIS — H409 Unspecified glaucoma: Secondary | ICD-10-CM | POA: Insufficient documentation

## 2015-07-30 DIAGNOSIS — Z01818 Encounter for other preprocedural examination: Secondary | ICD-10-CM | POA: Insufficient documentation

## 2015-07-30 HISTORY — DX: Polyneuropathy, unspecified: G62.9

## 2015-07-30 HISTORY — DX: Anemia, unspecified: D64.9

## 2015-07-30 NOTE — Pre-Procedure Instructions (Addendum)
    [name]  07/30/2015      [facility] - [place], [place] [address] Phone: [phone] Fax: [phone]    Your procedure is scheduled on  Wed. Feb 1  Report to [facility] Admitting at 6:30 A.M.  Call this number if you have problems the morning of surgery:  [phone]   Remember:  Do not eat food or drink liquids after midnight tues. Jan.31  Take these medicines the morning of surgery with A SIP OF WATER : advair inhaler-bring to hospital, neurontin, mucinex, nebulizer treatment, ativan, protonix,   Do not wear jewelry, make-up or nail polish.  Do not wear lotions, powders, or perfumes.  You may wear deodorant.  Do not shave 48 hours prior to surgery.  Men may shave face and neck.  Do not bring valuables to the hospital.  [facility] is not responsible for any belongings or valuables.  Contacts, dentures or bridgework may not be worn into surgery.  Leave your suitcase in the car.  After surgery it may be brought to your room.  For patients admitted to the hospital, discharge time will be determined by your treatment team.  Patients discharged the day of surgery will not be allowed to drive home.   Name and phone number of your driver:   [name]  [phone]  Please read over the following fact sheets that you were given. Pain Booklet, Coughing and Deep Breathing and Surgical Site Infection Prevention   Patient will be stopping coumadin on 07/31/15

## 2015-07-30 NOTE — Progress Notes (Addendum)
Patient states she will be stopping her coumadin tomorrow 07/31/2015 Patient has horrendous cough, spitting up yellowish tinge sputum.  She does feel a lot better today than on Monday. I have requested cxr results from [place] ([place]). Sigmund Hazel, PA here to assess for surgery.  She got to speak with Dr. Benjamine Mola regarding her current pulmonary status. Cassandria Santee, RN

## 2015-07-30 NOTE — Progress Notes (Signed)
Anesthesia PAT Evaluation: Patient is a 82 year old female scheduled for left cochlear implant on 08/04/15 by Dr. Teoh. She is s/p left tympanomastoidectomy on 04/28/15.  History includes former smoker, DIFFICULT INTUBATION, Wegener's granulomatosis, glaucoma, blindness in left eye, hearing loss left ear, PE '97 (on warfarin) and s/p IVC filter, Aspergillosis, emphysema, PNA 06/2014, admitted 07/20/14-07/30/14 with HCAP and developed ARF with stridor requiring intubation (had a "thick black fibrinous material hanging from pharynx to below the cords" that seemed to be the primary source of her SOB; underwent bronchoscopy with cultures growing MRSA; seen by PCCM, ENT, ID), diastolic CHF, LLE melanoma excision, left IHR '15. Started on 14 day course of Augmentin 875 mg BID with Medrol dose pack and Duoneb TID on 07/26/15 for fever 102.8 with worsening cough and congestion. Already on Advair, Mucinex, and Bactrim.  PCP is Dr. Arthur Green with [facility]. She is a resident at [facility]. Cardiologist is Dr. Nelson, last visit 02/22/15 for CHF with six month follow-up recommneded.  07/22/14 Anesthesia Record:  Intubation Type: IV induction Ventilation: Mask ventilation without difficulty Laryngoscope Size: 3 (Glidescope 3) Grade View: Grade I Tube type: Subglottic suction tube Tube size: 7.0 mm Number of attempts: 3 (First attempt by CRNA - full view but unable to pass tube. Patient ventilated between attempt - 2nd attempt by MDA - unsuccessful. Dr. McQuaide in and passed tube.) Airway Equipment and Method: Video-laryngoscopy Placement Confirmation: ETT inserted through vocal cords under direct vision, CO2 detector and breath sounds checked- equal and bilateral Tube secured with: Tape Dental Injury: Teeth and Oropharynx as per pre-operative assessment  Difficulty Due To: Difficult Airway- due to anterior larynx Future Recommendations: Recommend- induction with short-acting agent, and  alternative techniques readily available.       04/28/15 Anesthesia Record:  Intubation Type: Combination inhalational/ intravenous induction Ventilation: Mask ventilation without difficulty Laryngoscope Size: Miller and 2 Grade View: Grade I Tube type: Oral Tube size: 6.5 mm Number of attempts: 2 Airway Equipment and Method: Patient positioned with wedge pillow, Stylet and Bougie stylet Placement Confirmation: ETT inserted through vocal cords under direct vision, positive ETCO2 and breath sounds checked- equal and bilateral Secured at: 22 cm Tube secured with: Tape Dental Injury: Bloody posterior oropharynx  Difficulty Due To: Difficulty was anticipated and Difficult Airway- due to anterior larynx Future Recommendations: Recommend- induction with short-acting agent, and alternative techniques readily available Comments: Pt with easy mask and grade 1 view obtained with DL. Unable to pass size 7 OETT into airway even with the aid of bougie stylet. Size 6.5 tube passed easily over bougie into the airway and secured.    PAT Vitals: BP 147/73, HR 88, RR 24, T 36.4C, O2 sat 92%. (07/26/15 RR 24, HR 120, T 39.3C at [facility].)  Exam shows a pleasant Caucasian female. No acute distress but noted to have mild conversational dyspnea with noted upper airway wheeze that resolved quickly when not talking. Also with productive cough with intermittent coughing spells. Lungs sounded coarse with basilar rhonchi with some improvement with coughing. Heart RRR. She reports she has a chronic cough, but this is worse than her baseline. She does not feel fully recovered but feels "much" better than she did on 07/24/14 and 07/26/15. She doesn't feel SOB at rest unless she has prolonged talking. She is in a wheelchair. She is hard of hearing but can talk if you are facing her when she is questioned. Her friend Jackie is at her side.   Meds   include Bactrim DS MWF for Wegener's, Flonase, Advair, Lasix,  Neurontin, Mucinex, Xalatan, Xopenex, Ativan, Protonix, Florastor, Betimol, warfarin (had been told to hold warfarin starting 07/31/15).   Pulmonologist is Dr. McQuaid, last visit 03/01/15. In this note he discusses that 1) a lung nodule on CT is consistent with her known diagnosis of Wegener's with plans to repeat a CT in 2017, 2) her Wegener's did not appear to be active at that time and was doing well with saline rinses per ENT, 3) she has no evidence of active atypical mycobacterial disease right now and therefore there was no indication for treatment, 4) wheezing could be from astham or stenosis from her Wegener's which had been treated with balloon dilation in the past (she sounded "much better" on exam at that time). His note also states, "Notably, she has essentially normal lung function without significant airflow obstruction. Her FEV1 is greater than 97% predicted. Because of this and the fact that she was able to be successfully extubated while doing quite poorly from a respiratory standpoint makes me question the recent judgment that she would be intolerant of general anesthesia. As long as there is no active cardiac issue I see no reason why she could not have surgery as she is considering having a cochlear implant."  04/28/15 EKG: NSR.  02/25/15 Echo: Study Conclusions - Left ventricle: The cavity size was normal. Wall thickness was normal. Systolic function was normal. The estimated ejection fraction was in the range of 55% to 60%. Wall motion was normal; there were no regional wall motion abnormalities. Features are consistent with a pseudonormal left ventricular filling pattern, with concomitant abnormal relaxation and increased filling pressure (grade 2 diastolic dysfunction). Doppler parameters are consistent with high ventricular filling pressure. - Mitral valve: Calcified annulus. There was mild regurgitation. Valve area by continuity equation (using LVOT flow): 2.73 cm^2. - Tricuspid  valve: There was moderate regurgitation. - Pericardium, extracardiac: A trivial pericardial effusion was identified. Impressions: Normal LV systolic function; grade 2 diastolic dysfunction with elevated LV filling pressure; mild MR; moderate TR.  08/15/13 PFTs: FVC 2.34 (101%), FEV1 1.66 (97%), DLCOund 13.95 (68%). Spirometry showed no obstruction and no response to BD. Lung volumes showed no significant restriction. DLCO was minimally reduced.  07/25/15 CXR ([facility]): Findings: Subsegmental atelectatic changes are seen in the basal segments of the RLL of the lung. No evidence of consolidation or pleural effusion is noted. Bilateral hila appears normal. Degenerative changes are seen in the shoulder joint and in the thoracic spince. Focal eventration of the right hemidiaphragm is seen. Impression: No acute cardiopulmonary process.   07/08/15 Chest CT w/o contrast: IMPRESSION: 1. Pulmonary nodules have all decreased in size in the interval, in keeping with benign inflammatory nodules. No new significant pulmonary nodules. 2. Resolved pleural effusions. 3. Stable left apical bullous disease.  She had some labs done on 07/25/15 that showed WBC 7.3, H/H 9.2/28, glucose 111, Na 135, K 4.2, Alk phos elevated at 178, normal AST/ALT.  She will need a PT/INR prior to surgery.    She is recovering from an acute respiratory illness. Fortunately, she is starting to feel better on amoxicillin, steroids, and nebs. She has a fairly persistent cough today and not breathing at her baseline. She is afebrile and tachycardia has resolved. Based on how she sounds today, I do not think she will be recovered enough for surgery by Wednesday. She is already at increased pulmonary risk, so we would like her back at   her baseline. She does not want to rush surgery either. Discussed with anesthesiologist Dr. Lissa Hoard. Recommend postponing surgery for ~ 6-8 weeks for full recovery. She was not scheduled  to see Dr. Lake Bells until 11/30/15. She wanted to see him sooner. His office had an opening from 08/02/15; however patient felt this was not necessary since she was starting to recover. Instead, she will see Rexene Edison, NP on 08/16/15. Hopefully, we'll know better then if she can even think about rescheduling her surgery then. Dr. Lake Bells will also be in the office that day. In the meantime, I advised that she be re-evaluated by Dr. Nyoka Cowden or his staff or present to the ED if her symptoms worsen. I have notified Timmy at Dr. Deeann Saint office. She will notify OR scheduling that case will be cancelled.    George [facility] Short Stay Center/Anesthesiology Phone [phone] 07/30/2015 4:01 PM

## 2015-08-02 NOTE — Progress Notes (Signed)
Spoke with Timmy, Dr. Deeann Saint office, case is cancelled.

## 2015-08-04 ENCOUNTER — Encounter (HOSPITAL_COMMUNITY): Admission: RE | Payer: Self-pay | Source: Ambulatory Visit

## 2015-08-04 ENCOUNTER — Ambulatory Visit (HOSPITAL_COMMUNITY): Admission: RE | Admit: 2015-08-04 | Payer: Medicare Other | Source: Ambulatory Visit | Admitting: Otolaryngology

## 2015-08-04 SURGERY — INSERTION, IMPLANT, COCHLEAR
Anesthesia: General | Laterality: Left

## 2015-08-10 LAB — CBC AND DIFFERENTIAL
HCT: 32 % — AB (ref 36–46)
Hemoglobin: 10 g/dL — AB (ref 12.0–16.0)
Platelets: 421 10*3/uL — AB (ref 150–399)
WBC: 4.8 10*3/mL
WBC: 4.8 10^3/mL

## 2015-08-12 LAB — HEPATIC FUNCTION PANEL
ALT: 15 U/L (ref 7–35)
AST: 21 U/L (ref 13–35)
Alkaline Phosphatase: 102 U/L (ref 25–125)
Bilirubin, Total: 0.3 mg/dL

## 2015-08-12 LAB — BASIC METABOLIC PANEL
BUN: 13 mg/dL (ref 4–21)
CREATININE: 0.8 mg/dL (ref 0.5–1.1)
Glucose: 81 mg/dL
Potassium: 4.2 mmol/L (ref 3.4–5.3)
SODIUM: 138 mmol/L (ref 137–147)

## 2015-08-16 ENCOUNTER — Ambulatory Visit (INDEPENDENT_AMBULATORY_CARE_PROVIDER_SITE_OTHER): Payer: Medicare Other | Admitting: Adult Health

## 2015-08-16 ENCOUNTER — Encounter: Payer: Self-pay | Admitting: Adult Health

## 2015-08-16 VITALS — BP 124/68 | HR 75 | Temp 97.8°F | Ht 61.0 in | Wt 131.8 lb

## 2015-08-16 DIAGNOSIS — R918 Other nonspecific abnormal finding of lung field: Secondary | ICD-10-CM | POA: Diagnosis not present

## 2015-08-16 DIAGNOSIS — J4520 Mild intermittent asthma, uncomplicated: Secondary | ICD-10-CM | POA: Diagnosis not present

## 2015-08-16 NOTE — Patient Instructions (Addendum)
Continue on current regimen.  Rinse and gargle after use Advair  Use Flutter valve As needed .  Follow up Dr. Lake Bells  in 3 months and As needed

## 2015-08-16 NOTE — Progress Notes (Signed)
Subjective:    Patient ID: [name], female    DOB: Nov 09, 1932, 82 y.o.   MRN: [id number]  HPI    Review of Systems     Objective:   Physical Exam        Assessment & Plan:    Subjective:    Patient ID: [name], female    DOB: 23-Mar-1933, 82 y.o.   MRN: [id number]  HPI 82 yo with chronic pansinusitis, Wegener's granulomatosis and lung nodules   TEST  PFT's 2002: FEV1 1.80 (80%), ratio 64, TLC normal, DLCO 71% Spiro 2004: FEV1 1.82 (87%), ratio 70 PFT's 08/2013: FEV1 1.66 (97%), ratio 71, TLC 79%, DLCO 68% Felt to have asthma, and airflow limitation from bronchial stenosis associated with Wegener's (s/p balloon dilatation).    08/16/2015 Follow up : Asthma and Pulmonary nodules  Pt returns for a 3 month follow up.  Pt came late today with family /friend and is very upset that she had to wait. Support was provided.  Recently had follow up CT chest last month that showed pulmonary nodules have all decreased in size c/w benign etiology. CT was reviewed with pt.  She says she feels she is doing well with minimal cough. She uses flutter valve but does not feel she is using this right. We went over how to use this and offered her to watch video but she did not have the time.  Denies increased dyspnea or cough.  Remains on Advair Twice daily  .  She is excited she is going for cochlear implant next month. She had to postpone recently because she had fever (she reports it was self limiting) .  She denies chest pain, hemoptysis , orthopnea, edema .      Review of Systems  Constitutional: Negative for fever and unexpected weight change.  HENT:. Negative for dental problem, ear pain, nosebleeds, postnasal drip, rhinorrhea, sinus pressure, sneezing, sore throat and trouble swallowing.   Eyes: Negative for redness and itching.  Respiratory: Positive for cough. Negative for chest tightness, shortness of breath and wheezing.   Cardiovascular: Negative for  palpitations and leg swelling.  Gastrointestinal: Negative for nausea and vomiting.  Genitourinary: Negative for dysuria.  Musculoskeletal: Negative for joint swelling.  Skin: Negative for rash.  Neurological: Negative for headaches.  Hematological: Does not bruise/bleed easily.  Psychiatric/Behavioral: Negative for dysphoric mood. The patient is not nervous/anxious.        Objective:   Physical Exam Filed Vitals:   08/16/15 1438  BP: 124/68  Pulse: 75  Temp: 97.8 F (36.6 C)  Height: 5\' 1"  (1.549 m)  Weight: 131 lb 12.8 oz (59.784 kg)  SpO2: 95%    GEN: A/Ox3; pleasant , NAD, frail , able to get on exam table without assistance  HEENT:  [place]/AT,  EACs-clear, TMs-wnl, NOSE-clear, THROAT-clear, no lesions, no postnasal drip or exudate noted. Very HOH   NECK:  Supple w/ fair ROM; no JVD; normal carotid impulses w/o bruits; no thyromegaly or nodules palpated; no lymphadenopathy.  RESP  Decreased BS in bases , no accessory muscle use, no dullness to percussion  CARD:  RRR, no m/r/g  , tr  peripheral edema, pulses intact, no cyanosis or clubbing.  GI:   Soft & nt; nml bowel sounds; no organomegaly or masses detected.  Musco: Warm bil, no deformities or joint swelling noted.   Neuro: alert, no focal deficits noted.    Skin: Warm, no lesions or rashes   07/08/15 CT chest reivewed indpendently  Pulmonary nodules have all decreased in size in the interval, in keeping with benign inflammatory nodules. No new significant pulmonary nodules. 2. Resolved pleural effusions. 3. Stable left apical bullous disease. Assessment & Plan:

## 2015-08-16 NOTE — Assessment & Plan Note (Signed)
Controlled on regimen  Plan  Continue on current regimen.  Rinse and gargle after use Advair  Use Flutter valve As needed .  Follow up Dr. Lake Bells  in 3 months and As needed

## 2015-08-16 NOTE — Assessment & Plan Note (Signed)
Recent CT chest showed decreased nodule size, c/w benign etiology.

## 2015-08-17 LAB — PROTIME-INR: PROTIME: 29.4 s — AB (ref 10.0–13.8)

## 2015-08-17 LAB — POCT INR: INR: 2.7 — AB (ref 0.9–1.1)

## 2015-08-18 NOTE — Progress Notes (Signed)
Record reviewed. Glad to see that the pulmonary nodules are decreased in size. Continue current management of asthma.

## 2015-08-26 ENCOUNTER — Encounter: Payer: Self-pay | Admitting: Nurse Practitioner

## 2015-08-26 ENCOUNTER — Non-Acute Institutional Stay: Payer: Medicare Other | Admitting: Nurse Practitioner

## 2015-08-26 DIAGNOSIS — F411 Generalized anxiety disorder: Secondary | ICD-10-CM | POA: Diagnosis not present

## 2015-08-26 DIAGNOSIS — K219 Gastro-esophageal reflux disease without esophagitis: Secondary | ICD-10-CM | POA: Diagnosis not present

## 2015-08-26 DIAGNOSIS — J4520 Mild intermittent asthma, uncomplicated: Secondary | ICD-10-CM | POA: Diagnosis not present

## 2015-08-26 DIAGNOSIS — G5793 Unspecified mononeuropathy of bilateral lower limbs: Secondary | ICD-10-CM

## 2015-08-26 DIAGNOSIS — M3130 Wegener's granulomatosis without renal involvement: Secondary | ICD-10-CM

## 2015-08-26 DIAGNOSIS — D638 Anemia in other chronic diseases classified elsewhere: Secondary | ICD-10-CM | POA: Diagnosis not present

## 2015-08-26 DIAGNOSIS — K5900 Constipation, unspecified: Secondary | ICD-10-CM | POA: Diagnosis not present

## 2015-08-26 DIAGNOSIS — Z86711 Personal history of pulmonary embolism: Secondary | ICD-10-CM | POA: Diagnosis not present

## 2015-08-26 NOTE — Assessment & Plan Note (Signed)
07/25/15 Hgb 9.2,08/10/15 Hgb 10.0, no apparent bleeding. Continue to monitor the patient,

## 2015-08-26 NOTE — Assessment & Plan Note (Signed)
Prn Lorazepam 

## 2015-08-26 NOTE — Assessment & Plan Note (Signed)
Pain is managed with Gabapentin 300mg  bid.

## 2015-08-26 NOTE — Assessment & Plan Note (Signed)
Stable, continue Miralax.  

## 2015-08-26 NOTE — Assessment & Plan Note (Signed)
Baseline chronic cough now, resolved congestive, yellow phlegm, T 102.8, CXR 07/25/15 no acute process, completed 14 day course of Augmentin 875mg  bid, Medrol dose pk and Duoneb tid x 7days. Continue Bactrim. H/o cavitary pulmonary densities. Continue Advair bid, Mucinex bid

## 2015-08-26 NOTE — Assessment & Plan Note (Signed)
Continue Coumadin, goal of INR 2-3  

## 2015-08-26 NOTE — Assessment & Plan Note (Signed)
Stable, continue Pantoprazole 40mg bid.  

## 2015-08-26 NOTE — Assessment & Plan Note (Signed)
Felt to have asthma, and airflow limitation from bronchial stenosis associated with Wegener's (s/p balloon dilatation). Continue Advair, Mucinex, Levalbuterol Neb 

## 2015-08-26 NOTE — Progress Notes (Signed)
Patient ID: [name], female   DOB: 01-11-1933, 82 y.o.   MRN: [id number]  Location:  [facility] Room Number: [id number] Place of Service:  SNF (31) Provider:  Lennie Odor Mast NP  GREEN, Viviann Spare, MD  Patient Care Team: Estill Dooms, MD as PCP - General (Internal Medicine) Unice Bailey, MD as Consulting Physician (Rheumatology) Rozetta Nunnery, MD as Consulting Physician (Otolaryngology) Marcial Pacas, MD as Consulting Physician (Neurology) Michel Bickers, MD as Consulting Physician (Infectious Diseases) Volanda Napoleon, MD as Consulting Physician (Oncology) Juanito Doom, MD as Consulting Physician (Pulmonary Disease) Tanda Rockers, MD as Consulting Physician (Pulmonary Disease) Estill Dooms, MD as Consulting Physician (Geriatric Medicine) Coralie Keens, MD as Consulting Physician (General Surgery) Man Mast X, NP as Nurse Practitioner (Nurse Practitioner)  Extended Emergency Contact Information Primary Emergency Contact: Haygood,Sandra Address: [address] [place] [address] [place] Phone: [phone] Relation: Daughter Secondary Emergency Contact: Mcpheeters,[name] Address: [address] [place]          [place], [place] [address] [place] Phone: [phone] Mobile Phone: [phone] Relation: Son  Code Status:  DNR Goals of care: Advanced Directive information Advanced Directives 08/26/2015  Does patient have an advance directive? Yes  Type of Advance Directive Living will;Healthcare Power of Attorney  Does patient want to make changes to advanced directive? No - Patient declined  Copy of advanced directive(s) in chart? Yes     Chief Complaint  Patient presents with  . Medical Management of Chronic Issues    Routine Visit    HPI:  Pt is a 82 y.o. female seen today for medical management of chronic diseases.     Past Medical History  Diagnosis Date  . Wegener's  granulomatosis ([place])   . OA (osteoarthritis)   . Blindness of one eye   . Hearing loss in left ear     hearing aid both ears  . Bladder incontinence   . Glaucoma   . Clotting disorder ([place])   . History of pulmonary embolism 1997  . Shortness of breath dyspnea   . Pneumonia 12/15  . Multiple allergies   . Chronic sinusitis   . Acute respiratory failure with hypoxia ([place])   . Interstitial emphysema ([place])   . Aspergillosis ([place])   . Stroke [facility])     ??? blind left eye  . Cancer ([place])     melanoma   (chemo)  . Difficult intubation     Difficult airway with intubation for ARF 07/22/14 (due to anterior larynx, also had mucous plug)  . Anemia   . Neuropathy [facility])    Past Surgical History  Procedure Laterality Date  . Tubal ligation    . Pubovaginal sling    . Cataract extraction    . Melanoma excision      left leg  . Vena cava filter placement  1997    [place] filter  . Knee arthroscopy  2006    rt   . Inguinal hernia repair Left 02/17/2014    Procedure: LEFT INGUINAL HERNIA REPAIR WITH MESH;  Surgeon: Harl Bowie, MD;  Location: [facility];  Service: General;  Laterality: Left;  . Insertion of mesh N/A 02/17/2014    Procedure: INSERTION OF MESH;  Surgeon: Harl Bowie, MD;  Location: [facility];  Service: General;  Laterality: N/A;  . Tear duct  probing    . Tympanomastoidectomy Left 04/28/2015  . Tympanomastoidectomy Left 04/28/2015    Procedure: LEFT TYMPANOMASTOIDECTOMY;  Surgeon: Leta Baptist, MD;  Location: [facility];  Service: ENT;  Laterality: Left;  . Bronchosocpy      pt. states she has had over 10 in last 20 yrs.    Allergies  Allergen Reactions  . Adhesive [Tape] Rash    On on lower extremities       Medication List       This list is accurate as of: 08/26/15 12:36 PM.  Always use your most recent med list.               acetaminophen 650 MG CR tablet  Commonly known as:  TYLENOL  Take 650 mg by mouth 2 (two)  times daily. Every 6 hours as needed for pain     b complex vitamins capsule  Take 1 capsule by mouth daily.     calcium-vitamin D 500-200 MG-UNIT tablet  Commonly known as:  OSCAL WITH D  Take 1 tablet by mouth daily.     CHLORASEPTIC 1.4 % Liqd  Generic drug:  phenol  Use as directed 1 spray in the mouth or throat as needed for throat irritation / pain.     cholecalciferol 1000 units tablet  Commonly known as:  VITAMIN D  One daily for vitamin D supplement     fluticasone 50 MCG/ACT nasal spray  Commonly known as:  FLONASE  Place 1 spray into both nostrils every evening.     Fluticasone-Salmeterol 500-50 MCG/DOSE Aepb  Commonly known as:  ADVAIR DISKUS  INHALE 1 PUFF EVERY 12 HOURS.     gabapentin 300 MG capsule  Commonly known as:  NEURONTIN  Take 300 mg by mouth 3 (three) times daily.     guaiFENesin 600 MG 12 hr tablet  Commonly known as:  MUCINEX  Take 600 mg by mouth 2 (two) times daily.     latanoprost 0.005 % ophthalmic solution  Commonly known as:  XALATAN  Place 1 drop into both eyes at bedtime.     levalbuterol 0.31 MG/3ML nebulizer solution  Commonly known as:  XOPENEX  Take 1 ampule by nebulization every 6 (six) hours as needed for wheezing.     LORazepam 0.5 MG tablet  Commonly known as:  ATIVAN  Take 0.5 mg by mouth every 6 (six) hours as needed for anxiety.     multivitamin with minerals tablet  Take 1 tablet by mouth daily.     polyethylene glycol packet  Commonly known as:  MIRALAX / GLYCOLAX  Take 17 g by mouth at bedtime.     PROTONIX 40 MG tablet  Generic drug:  pantoprazole  Take 40 mg by mouth 2 (two) times daily.     sulfamethoxazole-trimethoprim 800-160 MG tablet  Commonly known as:  BACTRIM DS,SEPTRA DS  Take 1 tablet by mouth every Monday, Wednesday, and Friday. HOLD WHILE ON AUGMENTIN, THEN RESUME AS PREVIOUS     timolol 0.5 % ophthalmic solution  Commonly known as:  BETIMOL  Place 1 drop into the left eye daily with  breakfast.     warfarin 4 MG tablet  Commonly known as:  COUMADIN  Take one tablet by mouth once daily for anticoagulation        Review of Systems  Constitutional: Negative for fever, chills and diaphoresis.       Frail and generally weak.  HENT: Positive for hearing loss (Deaf on left, hearing  aid on right ). Negative for congestion, ear discharge, ear pain and tinnitus.        Left cochlear implant pending.  Eyes: Negative.  Negative for pain and redness.  Respiratory: Positive for cough and shortness of breath. Negative for wheezing (Intermittent).        History Wegener's granulomatosis. History of chronic sinusitis related to her Wegener's. Chronic cough and yellowish phlegm production   Cardiovascular: Positive for leg swelling. Negative for chest pain and palpitations.       Trace R+L foot/ankle, R>L  Gastrointestinal: Negative for nausea, abdominal pain, diarrhea and constipation.       History of GERD.  Endocrine: Negative for polydipsia.  Genitourinary: Positive for frequency. Negative for dysuria, urgency, hematuria and flank pain.       Frequency induced by furosemide. Episodes of incontinence of urine.  Musculoskeletal: Negative for myalgias, back pain and neck pain.       Generalized weakness Right foot pain, worsens with walking  Skin: Negative.  Negative for rash.  Neurological: Negative for dizziness, tremors, seizures, weakness and headaches.       Neuropathic foot pain.   Hematological: Does not bruise/bleed easily.       Chronic anemia.  Psychiatric/Behavioral: Negative for suicidal ideas and hallucinations. The patient is nervous/anxious.        Recently her husband has been admitted to SNF    Immunization History  Administered Date(s) Administered  . Influenza Split 03/04/2012, 03/11/2014, 04/01/2015  . Influenza,inj,Quad PF,36+ Mos 03/03/2013  . Influenza-Unspecified 04/01/2015  . PPD Test 05/25/2015  . Pneumococcal Conjugate-13 07/03/2009  .  Pneumococcal Polysaccharide-23 08/07/2014  . Zoster 09/30/1994   Pertinent  Health Maintenance Due  Topic Date Due  . INFLUENZA VACCINE  02/01/2016  . DEXA SCAN  Completed  . PNA vac Low Risk Adult  Completed   Fall Risk  02/03/2015 12/04/2014 11/26/2014 11/19/2014 09/10/2014  Falls in the past year? Yes Yes Yes No No  Number falls in past yr: 2 or more 2 or more 2 or more - -  Injury with Fall? - No No - -   Functional Status Survey:    Filed Vitals:   08/26/15 0912  BP: 126/72  Pulse: 72  Temp: 98.4 F (36.9 C)  TempSrc: Oral  Resp: 20  Height: 5\' 1"  (1.549 m)  Weight: 125 lb (56.7 kg)   Body mass index is 23.63 kg/(m^2). Physical Exam  Constitutional: She is oriented to person, place, and time. She appears well-developed. No distress.  Thin and frail. Weight stable  since 3/16  HENT:  Head: Normocephalic and atraumatic.  Nose: Nose normal.  Mouth/Throat: Oropharynx is clear and moist. No oropharyngeal exudate.  Bilateral hearing loss. Hearing aid - right ear Pending left cochlear implant  Eyes: Conjunctivae and EOM are normal. Pupils are equal, round, and reactive to light. Right eye exhibits no discharge. Left eye exhibits no discharge. No scleral icterus.  Neck: Normal range of motion. Neck supple. No JVD present. No tracheal deviation present. No thyromegaly present.  Cardiovascular: Normal rate, regular rhythm, normal heart sounds and intact distal pulses.   No murmur heard. Pulmonary/Chest: Effort normal. No stridor. No respiratory distress. She has no wheezes. She has rales. She exhibits no tenderness.  Abdominal: Soft. Bowel sounds are normal. She exhibits no distension and no mass. There is no tenderness. There is no rebound and no guarding.  Musculoskeletal: Normal range of motion. She exhibits edema (trace). She exhibits no tenderness.  R+L foot/ankle, R>L  Lymphadenopathy:    She has no cervical adenopathy.  Neurological: She is alert and oriented to person,  place, and time. She has normal reflexes. No cranial nerve deficit. She exhibits normal muscle tone. Coordination normal.  Skin: Skin is warm and dry. No rash noted. She is not diaphoretic. No erythema. No pallor.  Corns on the left foot at the tip of the third toe and at the fifth metacarpal phalangeal joint slightly lateral.   Psychiatric: She has a normal mood and affect. Her behavior is normal. Judgment and thought content normal.  Emotional when voice her worry not able to walk to SNF to visit her busband.     Labs reviewed:  Recent Labs  04/20/15 1409 05/13/15 07/25/15 08/12/15  NA 140 139 135* 138  K 4.2 4.0 4.2 4.2  CL 106  --   --   --   CO2 28  --   --   --   GLUCOSE 104*  --   --   --   BUN 12 12 14 13   CREATININE 0.79 0.7 0.6 0.8  CALCIUM 10.0  --   --   --     Recent Labs  05/13/15 07/25/15 08/12/15  AST 19 22 21   ALT 11 14 15   ALKPHOS 108 178* 102    Recent Labs  09/22/14 1049  11/23/14 1126 02/03/15 1335  04/20/15 1409 05/13/15 07/25/15 08/10/15  WBC 6.0  < > 5.6 5.1  --  5.8 5.2 7.3 4.8  4.8  NEUTROABS 4.2  --  3.9 3.4  --   --   --   --   --   HGB 12.5  < > 12.8 12.3  < > 11.7* 10.2* 9.2* 10.0*  HCT 39.1  < > 38.5 37.0  < > 36.3 32* 28* 32*  MCV 97  < > 96 96  --  90.8  --   --   --   PLT 242  --  345 302  < > 311 344 356 421*  < > = values in this interval not displayed. Lab Results  Component Value Date   TSH 1.56 05/13/2015   Lab Results  Component Value Date   HGBA1C 5.8* 07/28/2014   No results found for: CHOL, HDL, LDLCALC, LDLDIRECT, TRIG, CHOLHDL  Significant Diagnostic Results in last 30 days:  No results found.  Assessment/Plan  WEGENERS GRANULOMATOSIS Baseline chronic cough now, resolved congestive, yellow phlegm, T 102.8, CXR 07/25/15 no acute process, completed 14 day course of Augmentin 875mg  bid, Medrol dose pk and Duoneb tid x 7days. Continue Bactrim. H/o cavitary pulmonary densities. Continue Advair bid, Mucinex  bid  Intrinsic asthma Felt to have asthma, and airflow limitation from bronchial stenosis associated with Wegener's (s/p balloon dilatation). Continue Advair, Mucinex, Levalbuterol Neb  Anemia of chronic disease 07/25/15 Hgb 9.2,08/10/15 Hgb 10.0, no apparent bleeding. Continue to monitor the patient,   Constipation Stable, continue Miralax.   Personal history of PE (pulmonary embolism) Continue Coumadin, goal of INR 2-3  GERD (gastroesophageal reflux disease) Stable, continue Pantoprazole 40mg  bid  Generalized anxiety disorder Prn Lorazepam.   Mononeuropathy of both lower extremities Pain is managed with Gabapentin 300mg  bid.     Family/ staff Communication: continue AL for care needs  Labs/tests ordered: none

## 2015-09-09 ENCOUNTER — Encounter: Payer: Self-pay | Admitting: Nurse Practitioner

## 2015-09-09 ENCOUNTER — Non-Acute Institutional Stay: Payer: Medicare Other | Admitting: Nurse Practitioner

## 2015-09-09 VITALS — BP 120/82 | HR 80 | Temp 98.0°F | Ht 61.0 in | Wt 132.0 lb

## 2015-09-09 DIAGNOSIS — R269 Unspecified abnormalities of gait and mobility: Secondary | ICD-10-CM

## 2015-09-15 ENCOUNTER — Encounter (HOSPITAL_COMMUNITY): Payer: Self-pay

## 2015-09-15 ENCOUNTER — Encounter (HOSPITAL_COMMUNITY)
Admission: RE | Admit: 2015-09-15 | Discharge: 2015-09-15 | Disposition: A | Discharge status: Home / Self Care | Payer: Medicare Other | Source: Ambulatory Visit | Attending: Otolaryngology | Admitting: Otolaryngology

## 2015-09-15 DIAGNOSIS — H409 Unspecified glaucoma: Secondary | ICD-10-CM | POA: Diagnosis not present

## 2015-09-15 DIAGNOSIS — H905 Unspecified sensorineural hearing loss: Secondary | ICD-10-CM | POA: Diagnosis not present

## 2015-09-15 DIAGNOSIS — M3130 Wegener's granulomatosis without renal involvement: Secondary | ICD-10-CM | POA: Diagnosis not present

## 2015-09-15 DIAGNOSIS — I5030 Unspecified diastolic (congestive) heart failure: Secondary | ICD-10-CM | POA: Insufficient documentation

## 2015-09-15 DIAGNOSIS — Z79899 Other long term (current) drug therapy: Secondary | ICD-10-CM | POA: Diagnosis not present

## 2015-09-15 DIAGNOSIS — Z01812 Encounter for preprocedural laboratory examination: Secondary | ICD-10-CM | POA: Diagnosis not present

## 2015-09-15 DIAGNOSIS — H5442 Blindness, left eye, normal vision right eye: Secondary | ICD-10-CM | POA: Insufficient documentation

## 2015-09-15 DIAGNOSIS — Z86711 Personal history of pulmonary embolism: Secondary | ICD-10-CM | POA: Diagnosis not present

## 2015-09-15 DIAGNOSIS — Z01818 Encounter for other preprocedural examination: Secondary | ICD-10-CM | POA: Diagnosis present

## 2015-09-15 DIAGNOSIS — Z87891 Personal history of nicotine dependence: Secondary | ICD-10-CM | POA: Diagnosis not present

## 2015-09-15 DIAGNOSIS — Z7901 Long term (current) use of anticoagulants: Secondary | ICD-10-CM | POA: Insufficient documentation

## 2015-09-15 DIAGNOSIS — Z85820 Personal history of malignant melanoma of skin: Secondary | ICD-10-CM | POA: Insufficient documentation

## 2015-09-15 DIAGNOSIS — D649 Anemia, unspecified: Secondary | ICD-10-CM | POA: Insufficient documentation

## 2015-09-15 DIAGNOSIS — J439 Emphysema, unspecified: Secondary | ICD-10-CM | POA: Diagnosis not present

## 2015-09-15 LAB — CBC AND DIFFERENTIAL
Hemoglobin: 8.6 g/dL — AB (ref 12.0–16.0)
Platelets: 422 10*3/uL — AB (ref 150–399)
WBC: 4.9 10^3/mL

## 2015-09-15 LAB — CBC
HCT: 28.3 % — ABNORMAL LOW (ref 36.0–46.0)
HEMOGLOBIN: 8.6 g/dL — AB (ref 12.0–15.0)
MCH: 23.4 pg — AB (ref 26.0–34.0)
MCHC: 30.4 g/dL (ref 30.0–36.0)
MCV: 76.9 fL — ABNORMAL LOW (ref 78.0–100.0)
Platelets: 422 10*3/uL — ABNORMAL HIGH (ref 150–400)
RBC: 3.68 MIL/uL — ABNORMAL LOW (ref 3.87–5.11)
RDW: 17.5 % — AB (ref 11.5–15.5)
WBC: 4.9 10*3/uL (ref 4.0–10.5)

## 2015-09-15 LAB — POCT INR: INR: 2.4 — AB (ref 0.9–1.1)

## 2015-09-15 LAB — SURGICAL PCR SCREEN
MRSA, PCR: NEGATIVE
STAPHYLOCOCCUS AUREUS: NEGATIVE

## 2015-09-15 LAB — BASIC METABOLIC PANEL
ANION GAP: 9 (ref 5–15)
BUN: 13 mg/dL (ref 4–21)
BUN: 13 mg/dL (ref 6–20)
CALCIUM: 9.9 mg/dL (ref 8.9–10.3)
CO2: 23 mmol/L (ref 22–32)
CREATININE: 0.87 mg/dL (ref 0.44–1.00)
Chloride: 109 mmol/L (ref 101–111)
Creatinine: 0.9 mg/dL (ref 0.5–1.1)
GFR calc Af Amer: >60 mL/min (ref >60)
Glucose, Bld: 97 mg/dL (ref 65–99)
Glucose: 97 mg/dL
Potassium: 4.1 mmol/L (ref 3.4–5.3)
Potassium: 4.1 mmol/L (ref 3.5–5.1)
Sodium: 141 mmol/L (ref 135–145)
Sodium: 141 mmol/L (ref 137–147)

## 2015-09-15 LAB — PROTIME-INR
INR: 2.41 — ABNORMAL HIGH (ref 0.00–1.49)
PROTHROMBIN TIME: 25.9 s — AB (ref 11.6–15.2)
Protime: 25.9 seconds — AB (ref 10.0–13.8)

## 2015-09-15 NOTE — Progress Notes (Addendum)
States is still coughing and still productive but can sleep through the night without waking to cough.  Gina Gianotti PA will be here to assess patient  Spoke with Dr Deeann Saint office and was given instructions for pt to take last dose of coumadin tomorrow.  This information was given to both patient and [facility] home at [facility]

## 2015-09-15 NOTE — Pre-Procedure Instructions (Addendum)
    [name]  09/15/2015      [facility] - [place], [place] [address] Phone: [phone] Fax: [phone]    Your procedure is scheduled on Wednesday March 22nd   Report to [facility] Admitting at 6:30 am Your posted surgery times are 8:30-11:30  Call this number if you have problems the morning of surgery:  [phone]   Remember:  Do not eat food or drink liquids after midnight.  Take these medicines the morning of surgery with A SIP OF WATER: Flonase nasal spray, Advair diskus, gabapentin (Neurontin), Mucinex, Protonix and bactrim (sulfamethoxazole) .  You may take tylenol and ativan (lorazepam) if needed and use your nebulizer if needed. Stop taking your warfarin (coumadin) as instructed by your doctor. Stop taking all vitamin and mineral supplements (including your probiotic) along with stopping all Nsaids such as Ibuprofen, Aleve, Advil, Motrin, Naproxin.   Do not wear jewelry, make-up or nail polish.  Do not wear lotions, powders, or perfumes.  You may not wear deodorant.  Do not shave 48 hours prior to surgery.  Men may shave face and neck.   Do not bring valuables to the hospital.  [facility] is not responsible for any belongings or valuables.  Contacts, dentures or bridgework may not be worn into surgery.  Leave your suitcase in the car.  After surgery it may be brought to your room.  For patients admitted to the hospital, discharge time will be determined by your treatment team.   Please read over the following fact sheets that you were given. Pain Booklet, Coughing and Deep Breathing, MRSA Information and Surgical Site Infection Prevention

## 2015-09-15 NOTE — Progress Notes (Addendum)
Anesthesia Note: Patient is a 82 year old female scheduled for left cochlear implant on 09/22/15 by Dr. Benjamine Mola. Procedure was initially scheduled for 08/04/15, but was postponed due to acute cough with fever (treated with Augmentin, Medrol 07/26/15). Anesthesia recommended procedure be postponed for ~ 6 -8 weeks. In the meantime she has also had re-evaluation by pulmonology. She is s/p left tympanomastoidectomy on 04/28/15.  History includes former smoker, DIFFICULT INTUBATION, Wegener's granulomatosis, glaucoma, blindness in left eye, hearing loss left ear, PE '97 (on warfarin) and s/p IVC filter, Aspergillosis, emphysema, PNA 06/2014, admitted 07/20/14-07/30/14 with HCAP and developed ARF with stridor requiring intubation (had a "thick black fibrinous material hanging from pharynx to below the cords" that seemed to be the primary source of her SOB; underwent bronchoscopy with cultures growing MRSA; seen by PCCM, ENT, ID), diastolic CHF, LLE melanoma excision, left IHR '15.   PCP is Dr. Jeanmarie Hubert with [facility]. Last office note 08/26/15 by Man Max, NP. She is a resident at [facility] [phone]). Cardiologist is Dr. Meda Coffee, last visit 02/22/15 for CHF with six month follow-up recommneded.  07/22/14 Anesthesia Record:  Intubation Type: IV induction Ventilation: Mask ventilation without difficulty Laryngoscope Size: 3 (Glidescope 3) Grade View: Grade I Tube type: Subglottic suction tube Tube size: 7.0 mm Number of attempts: 3 (First attempt by CRNA - full view but unable to pass tube. Patient ventilated between attempt - 2nd attempt by MDA - unsuccessful. Dr. Wilnette Kales in and passed tube.) Airway Equipment and Method: Video-laryngoscopy Placement Confirmation: ETT inserted through vocal cords under direct vision, CO2 detector and breath sounds checked- equal and bilateral Tube secured with: Tape Dental Injury: Teeth and Oropharynx as per pre-operative assessment  Difficulty Due To:  Difficult Airway- due to anterior larynx Future Recommendations: Recommend- induction with short-acting agent, and alternative techniques readily available.       04/28/15 Anesthesia Record:  Intubation Type: Combination inhalational/ intravenous induction Ventilation: Mask ventilation without difficulty Laryngoscope Size: Miller and 2 Grade View: Grade I Tube type: Oral Tube size: 6.5 mm Number of attempts: 2 Airway Equipment and Method: Patient positioned with wedge pillow, Stylet and Bougie stylet Placement Confirmation: ETT inserted through vocal cords under direct vision, positive ETCO2 and breath sounds checked- equal and bilateral Secured at: 22 cm Tube secured with: Tape Dental Injury: Bloody posterior oropharynx  Difficulty Due To: Difficulty was anticipated and Difficult Airway- due to anterior larynx Future Recommendations: Recommend- induction with short-acting agent, and alternative techniques readily available Comments: Pt with easy mask and grade 1 view obtained with DL. Unable to pass size 7 OETT into airway even with the aid of bougie stylet. Size 6.5 tube passed easily over bougie into the airway and secured.        PAT Vitals:BP 137/55, HR 89, RR 20, T 36.3C, O2 sat 99%. Exam showed a pleasant Caucasian female in NAD. She looks much better then when I evaluated her on 07/30/15. She feels back at her baseline. She has a chronic productive cough. She denied recurrent respiratory illness or fever since I last saw her. There was no conversational dyspnea. She is in a wheelchair due to her leg pain and weakness. She has been using a motorized wheelchair at [facility]. A rolling wheelchair is too difficult for her to mobilize due to upper arm strength and pain. She denied any new cardiac symptoms. She is hard of hearing but can talk if you are facing her when she is questioned. Her friend Kennyth Lose is at  her side. Heart RRR, no murmur noted. Lungs sounds are coarse  throughout with few scattered rhonchi and wheezes. There was improvement with coughing although not completely clear. (She tells me that it is pretty usual for her to have some scattered wheezing or rattling. Several notes from her other providers seem to reflect this although a few document her lung sounds as clear.) Mild ankle edema.  Meds include Bactrim DS MWF for Wegener's, Flonase, Advair, Neurontin, Mucinex, Xalatan and Betimol ophthalmic, Xopenex, Ativan, Protonix, warfarin (had been told to hold warfarin starting 09/16/15).   Pulmonologist is Dr. Lake Bells. Last visit with Rexene Edison, NP on 08/16/15. In his 03/01/15 note he discusses that 1) a lung nodule on CT is consistent with her known diagnosis of Wegener's with plans to repeat a CT in 2017 [done 07/08/15], 2) her Wegener's did not appear to be active at that time and was doing well with saline rinses per ENT, 3) she has no evidence of active atypical mycobacterial disease right now and therefore there was no indication for treatment, 4) wheezing could be from asthma or stenosis from her Wegener's which had been treated with balloon dilation in the past (she sounded "much better" on exam at that time). His note also states, "Notably, she has essentially normal lung function without significant airflow obstruction. Her FEV1 is greater than 97% predicted. Because of this and the fact that she was able to be successfully extubated while doing quite poorly from a respiratory standpoint makes me question the recent judgment that she would be intolerant of general anesthesia. As long as there is no active cardiac issue I see no reason why she could not have surgery as she is considering having a cochlear implant."  04/28/15 EKG: NSR.   02/25/15 Echo: Study Conclusions - Left ventricle: The cavity size was normal. Wall thickness was normal. Systolic function was normal. The estimated ejection fraction was in the range of 55% to 60%. Wall motion was  normal; there were no regional wall motion abnormalities. Features are consistent with a pseudonormal left ventricular filling pattern, with concomitant abnormal relaxation and increased filling pressure (grade 2 diastolic dysfunction). Doppler parameters are consistent with high ventricular filling pressure. - Mitral valve: Calcified annulus. There was mild regurgitation. Valve area by continuity equation (using LVOT flow): 2.73 cm^2. - Tricuspid valve: There was moderate regurgitation. - Pericardium, extracardiac: A trivial pericardial effusion was identified. Impressions: Normal LV systolic function; grade 2 diastolic dysfunction with elevated LV filling pressure; mild MR; moderate TR.  08/15/13 PFTs: FVC 2.34 (101%), FEV1 1.66 (97%), DLCOunc 13.95 (68%). Spirometry showed no obstruction and no response to BD. Lung volumes showed no significant restriction. DLCO was minimally reduced.  07/25/15 CXR ([facility]): Findings: Subsegmental atelectatic changes are seen in the basal segments of the RLL of the lung. No evidence of consolidation or pleural effusion is noted. Bilateral hila appears normal. Degenerative changes are seen in the shoulder joint and in the thoracic spince. Focal eventration of the right hemidiaphragm is seen. Impression: No acute cardiopulmonary process. More recently she had a 1V CXR on 09/03/15 that showed: No acute pathology identified.  07/08/15 Chest CT w/o contrast: IMPRESSION: 1. Pulmonary nodules have all decreased in size in the interval, in keeping with benign inflammatory nodules. No new significant pulmonary nodules. 2. Resolved pleural effusions. 3. Stable left apical bullous disease.  Preoperative labs noted. BMET WNL. H/H 8.6/28.3, down from 10.0/32.0 (08/10/15) and 9.2/28 (07/25/15).  PT 25.9, INR 2.41. Glucose  97. She reports she occasionally has issues with minor epistaxis, but otherwise no noted hematochezia or other obvious blood loss. She  denied dizziness. Not tachycardic today. She has known anemia of chronic disease that is followed by her PCP at [facility]. She'll need a PT/INR on the day of surgery.  I discussed labs and her pulmonary history and exam findings with anesthesiologist Dr. Aris Lot. Clinically patient appears much improved from my previous evaluation. Her CXR on 09/03/15 showed no acute findings. She feels at baseline. Okay not to repeat CXR today. She also has known chronic anemia, although somewhat below her most recent baseline. She does not appear to be symptomatic from her anemia. Would not anticipate significant blood loss with planned procedure. I have called H/H result to Helene Kelp at [facility] for further follow-up purposes (labs to be faxed as requested). She will have their medical staff review. I also called INR and H/H results to Dr. Deeann Saint office. From an anesthesia standpoint, we anticipate that she can proceed as planned if no acute changes. She should continue to be at her baseline from a pulmonary standpoint.  George Hugh [address] [facility] Stay [facility]/Anesthesiology Phone [phone] 09/15/2015 3:13 PM  Addendum: Received follow-up labs done at [facility] on 09/16/15. H/H down to 7.4/24.8. MCV low at 77.3. INR 1.87. Discussed with anesthesiologist Dr. Veatrice Kells. We want to clarify plans for evaluation/management of patient's anemia with her PCP. With another 1 gm drop in her HGB in 24 hours, may want to consider postponing case to allow time for further evaluation. Kim at Dr. Deeann Saint office said he was aware of further drop in H/H and was okay with proceeding if no objection for her PCP and anesthesia team. I called and spoke with Dona Ana this morning. She has been in communication with Man Mast, NP. Warfarin is on hold. Reportedly, Prilosec added, Tylenol decreased. Stool Hemoccults 3 ordered. Consideration of iron supplement. CBC will be repeated on  09/20/15. If HGB > 8.0 then Man Mast, NP will consider clearing patient for surgery on Wednesday. I asked that someone give me an update next week.  George Hugh [facility] Short Stay Center/Anesthesiology Phone [phone] 09/17/2015 12:19 PM

## 2015-09-16 LAB — CBC AND DIFFERENTIAL
HEMATOCRIT: 25 % — AB (ref 36–46)
HEMOGLOBIN: 7.4 g/dL — AB (ref 12.0–16.0)
PLATELETS: 373 10*3/uL (ref 150–399)
WBC: 3.4 10*3/mL

## 2015-09-16 LAB — PROTIME-INR: PROTIME: 21.8 s — AB (ref 10.0–13.8)

## 2015-09-16 LAB — POCT INR: INR: 1.9 — AB (ref 0.9–1.1)

## 2015-09-16 NOTE — Progress Notes (Signed)
Patient ID: DESARAY SEPER, female   DOB: [birth year], 82 y.o.   MRN: [id number]  Location:  [place] Room Number: [id number] Place of Service:  Clinic ([phone]) Provider: Lennie Odor Danya Spearman NP  GREEN, Viviann Spare, MD  Patient Care Team: Estill Dooms, MD as PCP - General (Internal Medicine) Unice Bailey, MD as Consulting Physician (Rheumatology) Rozetta Nunnery, MD as Consulting Physician (Otolaryngology) Marcial Pacas, MD as Consulting Physician (Neurology) Michel Bickers, MD as Consulting Physician (Infectious Diseases) Volanda Napoleon, MD as Consulting Physician (Oncology) Juanito Doom, MD as Consulting Physician (Pulmonary Disease) Tanda Rockers, MD as Consulting Physician (Pulmonary Disease) Estill Dooms, MD as Consulting Physician (Geriatric Medicine) Coralie Keens, MD as Consulting Physician (General Surgery) Corsica Franson X, NP as Nurse Practitioner (Nurse Practitioner)  Extended Emergency Contact Information Primary Emergency Contact: Haygood,Sandra Address: [address] [place] [address] [place]te [place] of [place] Phone: [phone] Relation: Daughter Secondary Emergency Contact: Dooling,[name] Address: [address] [place]          [place], [place] [address] [place] Phone: [phone] Mobile Phone: [phone] Relation: Son  Code Status:  DNR Goals of care: Advanced Directive information Advanced Directives 09/15/2015  Does patient have an advance directive? Yes  Type of Advance Directive Mental Health Advance Directive  Copy of advanced directive(s) in chart? Yes  Would patient like information on creating an advanced directive? No - patient declined information     Chief Complaint  Patient presents with  . Face to Face    for evaluation for power wheel chair. Patient has trouble walkine due to neuropathy in feet    HPI:  Pt is a 82 y.o. female seen today for evaluation of need for power chair. The  patient has history of neuropathy of bilateral lower extremities which resulted in generalized weakness and pain of legs, she is only able to ambulates a few steps with walker in her room and depend on w/c to go further. In order to maintain her quality of life and mobility, she needs wheelchair to travel to dinning room for meals and attend activities on the [place] where she resides. Due to her left upper extremity pain related to an injury occurred 4-5 years, she is unable to propel manual wheelchair or steer a scooter in her living space to maintain her mobility. Mrs Leinweber manages her own affairs and finances with minimal assistance. She has worked with therapy in the past and has been deemed to be safe to operate a power chair.    Past Medical History  Diagnosis Date  . Wegener's granulomatosis ([place])   . OA (osteoarthritis)   . Blindness of one eye   . Hearing loss in left ear     hearing aid both ears  . Bladder incontinence   . Glaucoma   . Clotting disorder ([place])   . History of pulmonary embolism 1997  . Pneumonia 12/15  . Multiple allergies   . Chronic sinusitis   . Acute respiratory failure with hypoxia ([place])   . Interstitial emphysema ([place])   . Aspergillosis ([place])   . Stroke [facility])     ??? blind left eye  . Cancer ([place])     melanoma   ([place]mo)  . Difficult intubation     Difficult airway with intubation for ARF 07/22/14 (due to anterior larynx, also had mucous plug)  . Anemia   .  Neuropathy ([place])   . Shortness of breath dyspnea    Past Surgical History  Procedure Laterality Date  . Tubal ligation    . Pubovaginal sling    . Cataract extraction    . Melanoma excision      left leg  . Vena cava filter placement  1997    [place] filter  . Knee arthroscopy  2006    rt   . Inguinal hernia repair Left 02/17/2014    Procedure: LEFT INGUINAL HERNIA REPAIR WITH MESH;  Surgeon: Harl Bowie, MD;  Location: [facility];  Service: General;   Laterality: Left;  . Insertion of mesh N/A 02/17/2014    Procedure: INSERTION OF MESH;  Surgeon: Harl Bowie, MD;  Location: [facility];  Service: General;  Laterality: N/A;  . Tear duct probing    . Tympanomastoidectomy Left 04/28/2015  . Tympanomastoidectomy Left 04/28/2015    Procedure: LEFT TYMPANOMASTOIDECTOMY;  Surgeon: Leta Baptist, MD;  Location: [facility] OR;  Service: ENT;  Laterality: Left;  . Bronchosocpy      pt. states she has had over 10 in last 20 yrs.    Allergies  Allergen Reactions  . Adhesive [Tape] Rash    On on lower extremities       Medication List       This list is accurate as of: 09/09/15 11:59 PM.  Always use your most recent med list.               acetaminophen 650 MG CR tablet  Commonly known as:  TYLENOL  Take 650 mg by mouth 2 (two) times daily. Every 6 hours as needed for pain     b complex vitamins capsule  Take 1 capsule by mouth daily.     calcium-vitamin D 500-200 MG-UNIT tablet  Commonly known as:  OSCAL WITH D  Take 1 tablet by mouth daily.     cholecalciferol 1000 units tablet  Commonly known as:  VITAMIN D  One daily for vitamin D supplement     fluticasone 50 MCG/ACT nasal spray  Commonly known as:  FLONASE  Place 1 spray into both nostrils every evening.     Fluticasone-Salmeterol 500-50 MCG/DOSE Aepb  Commonly known as:  ADVAIR DISKUS  INHALE 1 PUFF EVERY 12 HOURS.     gabapentin 300 MG capsule  Commonly known as:  NEURONTIN  Take 300 mg by mouth 3 (three) times daily.     guaiFENesin 600 MG 12 hr tablet  Commonly known as:  MUCINEX  Take 600 mg by mouth 2 (two) times daily.     latanoprost 0.005 % ophthalmic solution  Commonly known as:  XALATAN  Place 1 drop into both eyes at bedtime.     levalbuterol 0.31 MG/3ML nebulizer solution  Commonly known as:  XOPENEX  Take 1 ampule by nebulization every 6 (six) hours as needed for wheezing.     LORazepam 0.5 MG tablet  Commonly known as:  ATIVAN  Take  0.5 mg by mouth every 6 (six) hours as needed for anxiety.     multivitamin with minerals tablet  Take 1 tablet by mouth daily.     polyethylene glycol packet  Commonly known as:  MIRALAX / GLYCOLAX  Take 17 g by mouth at bedtime. Reported on 09/10/2015     PROTONIX 40 MG tablet  Generic drug:  pantoprazole  Take 40 mg by mouth daily.     saccharomyces boulardii 250 MG capsule  Commonly  known as:  FLORASTOR  Take 250 mg by mouth 2 (two) times daily.     sulfamethoxazole-trimethoprim 800-160 MG tablet  Commonly known as:  BACTRIM DS,SEPTRA DS  Take 1 tablet by mouth every Monday, Wednesday, and Friday. HOLD WHILE ON AUGMENTIN, THEN RESUME AS PREVIOUS     timolol 0.5 % ophthalmic solution  Commonly known as:  BETIMOL  Place 1 drop into the left eye daily with breakfast.     warfarin 5 MG tablet  Commonly known as:  COUMADIN  Take 5 mg by mouth daily.        Review of Systems  Constitutional: Negative for fever, chills and diaphoresis.       Frail and generally weak.  HENT: Positive for hearing loss (Deaf on left, hearing aid on right ). Negative for congestion, ear discharge, ear pain and tinnitus.        Left cochlear implant pending.  Eyes: Negative.  Negative for pain and redness.  Respiratory: Positive for cough and shortness of breath. Negative for wheezing (Intermittent).        History Wegener's granulomatosis. History of chronic sinusitis related to her Wegener's. Chronic cough and yellowish phlegm production   Cardiovascular: Positive for leg swelling. Negative for chest pain and palpitations.       Trace R+L foot/ankle, R>L  Gastrointestinal: Negative for nausea, abdominal pain, diarrhea and constipation.       History of GERD.  Endocrine: Negative for polydipsia.  Genitourinary: Positive for frequency. Negative for dysuria, urgency, hematuria and flank pain.       Frequency induced by furosemide. Episodes of incontinence of urine.  Musculoskeletal: Positive for  arthralgias and gait problem. Negative for myalgias, back pain and neck pain.       Generalized weakness Right foot pain, worsens with walking Left upper extremity pain   Skin: Negative.  Negative for rash.  Neurological: Positive for weakness. Negative for dizziness, tremors, seizures and headaches.       Neuropathic leg/foot pain. Generalized weakness BLE  Hematological: Does not bruise/bleed easily.       Chronic anemia.  Psychiatric/Behavioral: Negative for suicidal ideas and hallucinations. The patient is nervous/anxious.        Recently her husband has been admitted to SNF    Immunization History  Administered Date(s) Administered  . Influenza Split 03/04/2012, 03/11/2014, 04/01/2015  . Influenza,inj,Quad PF,36+ Mos 03/03/2013  . Influenza-Unspecified 04/01/2015  . PPD Test 05/25/2015  . Pneumococcal Conjugate-13 07/03/2009  . Pneumococcal Polysaccharide-23 08/07/2014  . Zoster 09/30/1994   Pertinent  Health Maintenance Due  Topic Date Due  . INFLUENZA VACCINE  02/01/2016  . DEXA SCAN  Completed  . PNA vac Low Risk Adult  Completed   Fall Risk  09/09/2015 02/03/2015 12/04/2014 11/26/2014 11/19/2014  Falls in the past year? Yes Yes Yes Yes No  Number falls in past yr: 2 or more 2 or more 2 or more 2 or more -  Injury with Fall? No - No No -   Functional Status Survey:    Filed Vitals:   09/09/15 1328  BP: 120/82  Pulse: 80  Temp: 98 F (36.7 C)  TempSrc: Oral  Height: 5\' 1"  (1.549 m)  Weight: 132 lb (59.875 kg)  SpO2: 95%   Body mass index is 24.95 kg/(m^2). Physical Exam  Constitutional: She is oriented to person, place, and time. She appears well-developed. No distress.  Thin and frail. Weight stable  since 3/16  HENT:  Head: Normocephalic and atraumatic.  Nose:  Nose normal.  Mouth/Throat: Oropharynx is clear and moist. No oropharyngeal exudate.  Bilateral hearing loss. Hearing aid - right ear Pending left cochlear implant  Eyes: Conjunctivae and EOM are  normal. Pupils are equal, round, and reactive to light. Right eye exhibits no discharge. Left eye exhibits no discharge. No scleral icterus.  Neck: Normal range of motion. Neck supple. No JVD present. No tracheal deviation present. No thyromegaly present.  Cardiovascular: Normal rate, regular rhythm, normal heart sounds and intact distal pulses.   No murmur heard. Pulmonary/Chest: Effort normal. No stridor. No respiratory distress. She has no wheezes. She has rales. She exhibits no tenderness.  Abdominal: Soft. Bowel sounds are normal. She exhibits no distension and no mass. There is no tenderness. There is no rebound and no guarding.  Musculoskeletal: Normal range of motion. She exhibits edema (trace) and tenderness.  R+L foot/ankle, R>L Left upper extremity pain  Lymphadenopathy:    She has no cervical adenopathy.  Neurological: She is alert and oriented to person, place, and time. She has normal reflexes. No cranial nerve deficit. She exhibits normal muscle tone. Coordination normal.  The patient has history of neuropathy of bilateral lower extremities which resulted in generalized weakness and pain of legs, she is only able to ambulates a few steps with walker in her room and depend on w/c to go further. In order to maintain her quality of life and mobility, she needs wheelchair to travel to dinning room for meals and attend activities on the [place] where she resides. Due to her left upper extremity pain related to an injury occurred 4-5 years, she is unable to propel manual wheelchair or steer a scooter in her living space to maintain her mobility. Mrs Cogbill manages her own affairs and finances with minimal assistance. She has worked with therapy in the past and has been deemed to be safe to operate a power chair.    Skin: Skin is warm and dry. No rash noted. She is not diaphoretic. No erythema. No pallor.  Corns on the left foot at the tip of the third toe and at the fifth  metacarpal phalangeal joint slightly lateral.   Psychiatric: She has a normal mood and affect. Her behavior is normal. Judgment and thought content normal.  Emotional when voice her worry not able to walk to SNF to visit her busband.     Labs reviewed:  Recent Labs  04/20/15 1409  07/25/15 08/12/15 09/15/15 1342  NA 140  < > 135* 138 141  K 4.2  < > 4.2 4.2 4.1  CL 106  --   --   --  109  CO2 28  --   --   --  23  GLUCOSE 104*  --   --   --  97  BUN 12  < > 14 13 13   CREATININE 0.79  < > 0.6 0.8 0.87  CALCIUM 10.0  --   --   --  9.9  < > = values in this interval not displayed.  Recent Labs  05/13/15 07/25/15 08/12/15  AST 19 22 21   ALT 11 14 15   ALKPHOS 108 178* 102    Recent Labs  09/22/14 1049  11/23/14 1126 02/03/15 1335 04/20/15 1409  07/25/15 08/10/15 09/15/15 1342  WBC 6.0  < > 5.6 5.1 5.8  < > 7.3 4.8  4.8 4.9  NEUTROABS 4.2  --  3.9 3.4  --   --   --   --   --  HGB 12.5  < > 12.8 12.3 11.7*  < > 9.2* 10.0* 8.6*  HCT 39.1  < > 38.5 37.0 36.3  < > 28* 32* 28.3*  MCV 97  < > 96 96 90.8  --   --   --  76.9*  PLT 242  --  345 302 311  < > 356 421* 422*  < > = values in this interval not displayed. Lab Results  Component Value Date   TSH 1.56 05/13/2015   Lab Results  Component Value Date   HGBA1C 5.8* 07/28/2014   No results found for: CHOL, HDL, LDLCALC, LDLDIRECT, TRIG, CHOLHDL  Significant Diagnostic Results in last 30 days:  No results found.  Assessment/Plan  Abnormality of gait The patient has history of neuropathy of bilateral lower extremities which resulted in generalized weakness and pain of legs, she is only able to ambulates a few steps with walker in her room and depend on w/c to go further. In order to maintain her quality of life and mobility, she needs wheelchair to travel to dinning room for meals and attend activities on the [place] where she resides. Due to her left upper extremity pain related to an injury occurred 4-5  years, she is unable to propel manual wheelchair or steer a scooter in her living space to maintain her mobility. Mrs Delashmit manages her own affairs and finances with minimal assistance. She has worked with therapy in the past and has been deemed to be safe to operate a power chair.      Family/ staff Communication: continue AL for care needs.   Labs/tests ordered:  none

## 2015-09-16 NOTE — Assessment & Plan Note (Signed)
The patient has history of neuropathy of bilateral lower extremities which resulted in generalized weakness and pain of legs, she is only able to ambulates a few steps with walker in her room and depend on w/c to go further. In order to maintain her quality of life and mobility, she needs wheelchair to travel to dinning room for meals and attend activities on the [place] where she resides. Due to her left upper extremity pain related to an injury occurred 4-5 years, she is unable to propel manual wheelchair or steer a scooter in her living space to maintain her mobility. [name] manages her own affairs and finances with minimal assistance. She has worked with therapy in the past and has been deemed to be safe to operate a power chair.

## 2015-09-17 ENCOUNTER — Non-Acute Institutional Stay: Payer: Medicare Other | Admitting: Nurse Practitioner

## 2015-09-17 ENCOUNTER — Encounter: Payer: Self-pay | Admitting: Nurse Practitioner

## 2015-09-17 DIAGNOSIS — G5793 Unspecified mononeuropathy of bilateral lower limbs: Secondary | ICD-10-CM | POA: Diagnosis not present

## 2015-09-17 DIAGNOSIS — K5900 Constipation, unspecified: Secondary | ICD-10-CM

## 2015-09-17 DIAGNOSIS — Z86711 Personal history of pulmonary embolism: Secondary | ICD-10-CM

## 2015-09-17 DIAGNOSIS — F411 Generalized anxiety disorder: Secondary | ICD-10-CM

## 2015-09-17 DIAGNOSIS — J4520 Mild intermittent asthma, uncomplicated: Secondary | ICD-10-CM | POA: Diagnosis not present

## 2015-09-17 DIAGNOSIS — D62 Acute posthemorrhagic anemia: Secondary | ICD-10-CM

## 2015-09-17 DIAGNOSIS — M3130 Wegener's granulomatosis without renal involvement: Secondary | ICD-10-CM | POA: Diagnosis not present

## 2015-09-17 DIAGNOSIS — K219 Gastro-esophageal reflux disease without esophagitis: Secondary | ICD-10-CM | POA: Diagnosis not present

## 2015-09-20 LAB — PROTIME-INR: Protime: 23.4 seconds — AB (ref 10.0–13.8)

## 2015-09-20 LAB — CBC AND DIFFERENTIAL
HEMATOCRIT: 29 % — AB (ref 36–46)
HEMOGLOBIN: 8.6 g/dL — AB (ref 12.0–16.0)
PLATELETS: 398 10*3/uL (ref 150–399)
WBC: 4.2 10^3/mL

## 2015-09-20 LAB — POCT INR: INR: 2 — AB (ref 0.9–1.1)

## 2015-09-20 NOTE — Assessment & Plan Note (Signed)
Pain is managed with Gabapentin 300mg  bid.

## 2015-09-20 NOTE — Assessment & Plan Note (Signed)
Stable, continue Omeprazole 20mg daily.  

## 2015-09-20 NOTE — Assessment & Plan Note (Signed)
Stable, continue Miralax.  

## 2015-09-20 NOTE — Assessment & Plan Note (Signed)
Stated she has nose bleed on and off, 09/15/15 Hgb 8.6 while preop for 09/22/15 left cochlear implant. S/p left tympanomastoidectomy 04/28/15. Repeated Hgb  7.4 09/16/15. She is taking Omeprazole 20mg  daily. 09/17/15 Guaiac stool x2 negative, adding Ferrous Sulfate 325mg  bid, will repeat CBC, Retic count 09/20/15, hold Coumadin, change Tylenol to prn, INR 1.87 09/16/15. R vs B explained to the patient. She was instructed to be safe and rest for now.

## 2015-09-20 NOTE — Progress Notes (Signed)
Patient ID: [name], female   DOB: 1932-07-16, 82 y.o.   MRN: [id number]  Location:  [facility] Room Number: [id number] Place of Service: [facility] Provider:  Lennie Odor Rawson Minix NP  GREEN, Viviann Spare, MD  Patient Care Team: Estill Dooms, MD as PCP - General (Internal Medicine) Unice Bailey, MD as Consulting Physician (Rheumatology) Rozetta Nunnery, MD as Consulting Physician (Otolaryngology) Marcial Pacas, MD as Consulting Physician (Neurology) Michel Bickers, MD as Consulting Physician (Infectious Diseases) Volanda Napoleon, MD as Consulting Physician (Oncology) Juanito Doom, MD as Consulting Physician (Pulmonary Disease) Tanda Rockers, MD as Consulting Physician (Pulmonary Disease) Estill Dooms, MD as Consulting Physician (Geriatric Medicine) Coralie Keens, MD as Consulting Physician (General Surgery) Antanette Richwine X, NP as Nurse Practitioner (Nurse Practitioner)  Extended Emergency Contact Information Primary Emergency Contact: Haygood,Sandra Address: [address] [place] [address] [place] Phone: [phone] Relation: Daughter Secondary Emergency Contact: Collington,[name] Address: [address] [place]          [place], [place] [address] [place] Phone: [phone] Mobile Phone: [phone] Relation: Son  Code Status:  DNR Goals of care: Advanced Directive information Advanced Directives 09/17/2015  Does patient have an advance directive? Yes  Type of Advance Directive Mental Health Advance Directive  Does patient want to make changes to advanced directive? No - Patient declined  Copy of advanced directive(s) in chart? Yes  Would patient like information on creating an advanced directive? -     Chief Complaint  Patient presents with  . Medical Management of Chronic Issues    labs    HPI:  Pt is a 82 y.o. female seen today for medical management of chronic diseases.  Acute dropped Hgb  7-8, stated she has on and off nose bleed, negative stool guaiac x1, Coumadin on hold, Fe added, repeat CBC pending today.    Past Medical History  Diagnosis Date  . Wegener's granulomatosis ([place])   . OA (osteoarthritis)   . Blindness of one eye   . Hearing loss in left ear     hearing aid both ears  . Bladder incontinence   . Glaucoma   . Clotting disorder ([place])   . History of pulmonary embolism 1997  . Pneumonia 12/15  . Multiple allergies   . Chronic sinusitis   . Acute respiratory failure with hypoxia ([place])   . Interstitial emphysema ([place])   . Aspergillosis ([place])   . Stroke [facility])     ??? blind left eye  . Cancer ([place])     melanoma   ([place])  . Difficult intubation     Difficult airway with intubation for ARF 07/22/14 (due to anterior larynx, also had mucous plug)  . Anemia   . Neuropathy ([place])   . Shortness of breath dyspnea    Past Surgical History  Procedure Laterality Date  . Tubal ligation    . Pubovaginal sling    . Cataract extraction    . Melanoma excision      left leg  . Vena cava filter placement  1997    [place] filter  . Knee arthroscopy  2006    rt   . Inguinal hernia repair Left 02/17/2014    Procedure: LEFT INGUINAL HERNIA REPAIR WITH MESH;  Surgeon: Harl Bowie, MD;  Location: [facility];  Service: General;  Laterality: Left;  . Insertion  of mesh N/A 02/17/2014    Procedure: INSERTION OF MESH;  Surgeon: Harl Bowie, MD;  Location: [facility];  Service: General;  Laterality: N/A;  . Tear duct probing    . Tympanomastoidectomy Left 04/28/2015  . Tympanomastoidectomy Left 04/28/2015    Procedure: LEFT TYMPANOMASTOIDECTOMY;  Surgeon: Leta Baptist, MD;  [facility];  Service: ENT;  Laterality: Left;  . Bronchosocpy      pt. states she has had over 10 in last 20 yrs.    Allergies  Allergen Reactions  . Adhesive [Tape] Rash    On on lower extremities       Medication List       This list is accurate  as of: 09/17/15 11:59 PM.  Always use your most recent med list.               acetaminophen 650 MG CR tablet  Commonly known as:  TYLENOL  Take 650 mg by mouth 2 (two) times daily. Every 6 hours as needed for pain     b complex vitamins capsule  Take 1 capsule by mouth daily.     calcium-vitamin D 500-200 MG-UNIT tablet  Commonly known as:  OSCAL WITH D  Take 1 tablet by mouth daily.     cholecalciferol 1000 units tablet  Commonly known as:  VITAMIN D  One daily for vitamin D supplement     fluticasone 50 MCG/ACT nasal spray  Commonly known as:  FLONASE  Place 1 spray into both nostrils every evening.     Fluticasone-Salmeterol 500-50 MCG/DOSE Aepb  Commonly known as:  ADVAIR DISKUS  INHALE 1 PUFF EVERY 12 HOURS.     gabapentin 300 MG capsule  Commonly known as:  NEURONTIN  Take 300 mg by mouth 3 (three) times daily.     guaiFENesin 600 MG 12 hr tablet  Commonly known as:  MUCINEX  Take 600 mg by mouth 2 (two) times daily.     latanoprost 0.005 % ophthalmic solution  Commonly known as:  XALATAN  Place 1 drop into both eyes at bedtime.     levalbuterol 0.31 MG/3ML nebulizer solution  Commonly known as:  XOPENEX  Take 1 ampule by nebulization every 6 (six) hours as needed for wheezing.     LORazepam 0.5 MG tablet  Commonly known as:  ATIVAN  Take 0.5 mg by mouth every 6 (six) hours as needed for anxiety.     multivitamin with minerals tablet  Take 1 tablet by mouth daily.     PROTONIX 40 MG tablet  Generic drug:  pantoprazole  Take 40 mg by mouth daily.     saccharomyces boulardii 250 MG capsule  Commonly known as:  FLORASTOR  Take 250 mg by mouth 2 (two) times daily.     sulfamethoxazole-trimethoprim 800-160 MG tablet  Commonly known as:  BACTRIM DS,SEPTRA DS  Take 1 tablet by mouth every Monday, Wednesday, and Friday. HOLD WHILE ON AUGMENTIN, THEN RESUME AS PREVIOUS     timolol 0.5 % ophthalmic solution  Commonly known as:  BETIMOL  Place 1 drop into  the left eye daily with breakfast.     warfarin 5 MG tablet  Commonly known as:  COUMADIN  Take 5 mg by mouth daily.        Review of Systems  Constitutional: Negative for fever, chills and diaphoresis.       Frail and generally weak.  HENT: Positive for hearing loss (Deaf on left, hearing aid on  right ) and nosebleeds. Negative for congestion, ear discharge, ear pain and tinnitus.        Left cochlear implant pending.  Eyes: Negative.  Negative for pain and redness.  Respiratory: Positive for cough and shortness of breath. Negative for wheezing (Intermittent).        History Wegener's granulomatosis. History of chronic sinusitis related to her Wegener's. Chronic cough and yellowish phlegm production   Cardiovascular: Positive for leg swelling. Negative for chest pain and palpitations.       Trace R+L foot/ankle, R>L  Gastrointestinal: Negative for nausea, abdominal pain, diarrhea and constipation.       History of GERD.  Endocrine: Negative for polydipsia.  Genitourinary: Positive for frequency. Negative for dysuria, urgency, hematuria and flank pain.       Frequency induced by furosemide. Episodes of incontinence of urine.  Musculoskeletal: Positive for arthralgias and gait problem. Negative for myalgias, back pain and neck pain.       Generalized weakness Right foot pain, worsens with walking Left upper extremity pain   Skin: Negative.  Negative for rash.  Neurological: Positive for weakness. Negative for dizziness, tremors, seizures and headaches.       Neuropathic leg/foot pain. Generalized weakness BLE  Hematological: Does not bruise/bleed easily.       Chronic anemia.  Psychiatric/Behavioral: Negative for suicidal ideas and hallucinations. The patient is nervous/anxious.        Recently her husband has been admitted to SNF    Immunization History  Administered Date(s) Administered  . Influenza Split 03/04/2012, 03/11/2014, 04/01/2015  . Influenza,inj,Quad PF,36+ Mos  03/03/2013  . Influenza-Unspecified 04/01/2015  . PPD Test 05/25/2015  . Pneumococcal Conjugate-13 07/03/2009  . Pneumococcal Polysaccharide-23 08/07/2014  . Zoster 09/30/1994   Pertinent  Health Maintenance Due  Topic Date Due  . INFLUENZA VACCINE  02/01/2016  . DEXA SCAN  Completed  . PNA vac Low Risk Adult  Completed   Fall Risk  09/09/2015 02/03/2015 12/04/2014 11/26/2014 11/19/2014  Falls in the past year? Yes Yes Yes Yes No  Number falls in past yr: 2 or more 2 or more 2 or more 2 or more -  Injury with Fall? No - No No -   Functional Status Survey:    Filed Vitals:   09/17/15 1250  BP: 122/68  Pulse: 76  Temp: 98.1 F (36.7 C)  TempSrc: Oral  Resp: 18  Height: 5\' 1"  (1.549 m)  Weight: 133 lb (60.328 kg)   Body mass index is 25.14 kg/(m^2). Physical Exam  Constitutional: She is oriented to person, place, and time. She appears well-developed. No distress.  Thin and frail. Weight stable  since 3/16  HENT:  Head: Normocephalic and atraumatic.  Nose: Nose normal.  Mouth/Throat: Oropharynx is clear and moist. No oropharyngeal exudate.  Bilateral hearing loss. Hearing aid - right ear Pending left cochlear implant  Eyes: Conjunctivae and EOM are normal. Pupils are equal, round, and reactive to light. Right eye exhibits no discharge. Left eye exhibits no discharge. No scleral icterus.  Neck: Normal range of motion. Neck supple. No JVD present. No tracheal deviation present. No thyromegaly present.  Cardiovascular: Normal rate, regular rhythm, normal heart sounds and intact distal pulses.   No murmur heard. Pulmonary/Chest: Effort normal. No stridor. No respiratory distress. She has no wheezes. She has rales. She exhibits no tenderness.  Abdominal: Soft. Bowel sounds are normal. She exhibits no distension and no mass. There is no tenderness. There is no rebound and no guarding.  Musculoskeletal: Normal range of motion. She exhibits edema (trace) and tenderness.  R+L foot/ankle,  R>L Left upper extremity pain  Lymphadenopathy:    She has no cervical adenopathy.  Neurological: She is alert and oriented to person, place, and time. She has normal reflexes. No cranial nerve deficit. She exhibits normal muscle tone. Coordination normal.  The patient has history of neuropathy of bilateral lower extremities which resulted in generalized weakness and pain of legs, she is only able to ambulates a few steps with walker in her room and depend on w/c to go further. In order to maintain her quality of life and mobility, she needs wheelchair to travel to dinning room for meals and attend activities on the [place] where she resides. Due to her left upper extremity pain related to an injury occurred 4-5 years, she is unable to propel manual wheelchair or steer a scooter in her living space to maintain her mobility. Mrs Langman manages her own affairs and finances with minimal assistance. She has worked with therapy in the past and has been deemed to be safe to operate a power chair.    Skin: Skin is warm and dry. No rash noted. She is not diaphoretic. No erythema. No pallor.  Corns on the left foot at the tip of the third toe and at the fifth metacarpal phalangeal joint slightly lateral.   Psychiatric: She has a normal mood and affect. Her behavior is normal. Judgment and thought content normal.  Emotional when voice her worry not able to walk to SNF to visit her busband.     Labs reviewed:  Recent Labs  04/20/15 1409  07/25/15 08/12/15 09/15/15 1342  NA 140  < > 135* 138 141  K 4.2  < > 4.2 4.2 4.1  CL 106  --   --   --  109  CO2 28  --   --   --  23  GLUCOSE 104*  --   --   --  97  BUN 12  < > 14 13 13   CREATININE 0.79  < > 0.6 0.8 0.87  CALCIUM 10.0  --   --   --  9.9  < > = values in this interval not displayed.  Recent Labs  05/13/15 07/25/15 08/12/15  AST 19 22 21   ALT 11 14 15   ALKPHOS 108 178* 102    Recent Labs  09/22/14 1049  11/23/14 1126  02/03/15 1335 04/20/15 1409  07/25/15 08/10/15 09/15/15 1342  WBC 6.0  < > 5.6 5.1 5.8  < > 7.3 4.8  4.8 4.9  NEUTROABS 4.2  --  3.9 3.4  --   --   --   --   --   HGB 12.5  < > 12.8 12.3 11.7*  < > 9.2* 10.0* 8.6*  HCT 39.1  < > 38.5 37.0 36.3  < > 28* 32* 28.3*  MCV 97  < > 96 96 90.8  --   --   --  76.9*  PLT 242  --  345 302 311  < > 356 421* 422*  < > = values in this interval not displayed. Lab Results  Component Value Date   TSH 1.56 05/13/2015   Lab Results  Component Value Date   HGBA1C 5.8* 07/28/2014   No results found for: CHOL, HDL, LDLCALC, LDLDIRECT, TRIG, CHOLHDL  Significant Diagnostic Results in last 30 days:  No results found.  Assessment/Plan  Acute blood loss anemia Stated she has  nose bleed on and off, 09/15/15 Hgb 8.6 while preop for 09/22/15 left cochlear implant. S/p left tympanomastoidectomy 04/28/15. Repeated Hgb  7.4 09/16/15. She is taking Omeprazole 20mg  daily. 09/17/15 Guaiac stool x2 negative, adding Ferrous Sulfate 325mg  bid, will repeat CBC, Retic count 09/20/15, hold Coumadin, change Tylenol to prn, INR 1.87 09/16/15. R vs B explained to the patient. She was instructed to be safe and rest for now.    Constipation Stable, continue Miralax.   Generalized anxiety disorder Prn Lorazepam.   GERD (gastroesophageal reflux disease) Stable, continue Omeprazole 20mg  daily  Intrinsic asthma Felt to have asthma, and airflow limitation from bronchial stenosis associated with Wegener's (s/p balloon dilatation). Continue Advair, Mucinex, Levalbuterol Neb  Mononeuropathy of both lower extremities Pain is managed with Gabapentin 300mg  bid.   Personal history of PE (pulmonary embolism) Continue Coumadin, goal of INR 2-3, current on hold due to nose bleed and dropped Hgb  WEGENERS GRANULOMATOSIS 09/03/15 CXR no acute pathology is identified.     Family/ staff Communication: supervision for safety.   Labs/tests ordered:  CBC pending.

## 2015-09-20 NOTE — Assessment & Plan Note (Signed)
Felt to have asthma, and airflow limitation from bronchial stenosis associated with Wegener's (s/p balloon dilatation). Continue Advair, Mucinex, Levalbuterol Neb 

## 2015-09-20 NOTE — Assessment & Plan Note (Signed)
Prn Lorazepam 

## 2015-09-20 NOTE — Assessment & Plan Note (Signed)
Continue Coumadin, goal of INR 2-3, current on hold due to nose bleed and dropped Hgb

## 2015-09-20 NOTE — Assessment & Plan Note (Signed)
09/03/15 CXR no acute pathology is identified.

## 2015-09-21 LAB — POCT INR: INR: 1.1 (ref 0.9–1.1)

## 2015-09-21 LAB — CBC AND DIFFERENTIAL
HEMATOCRIT: 24 % — AB (ref 36–46)
HEMOGLOBIN: 7.6 g/dL — AB (ref 12.0–16.0)
PLATELETS: 390 10*3/uL (ref 150–399)
WBC: 4 10*3/mL

## 2015-09-21 LAB — PROTIME-INR: PROTIME: 14.4 s — AB (ref 10.0–13.8)

## 2015-09-22 LAB — CBC AND DIFFERENTIAL
HCT: 27 % — AB (ref 36–46)
Hemoglobin: 8.1 g/dL — AB (ref 12.0–16.0)
Platelets: 398 10*3/uL (ref 150–399)
WBC: 4.7 10*3/mL

## 2015-09-23 ENCOUNTER — Other Ambulatory Visit (HOSPITAL_BASED_OUTPATIENT_CLINIC_OR_DEPARTMENT_OTHER): Payer: Medicare Other

## 2015-09-23 ENCOUNTER — Ambulatory Visit (HOSPITAL_BASED_OUTPATIENT_CLINIC_OR_DEPARTMENT_OTHER): Payer: Medicare Other | Admitting: Family

## 2015-09-23 ENCOUNTER — Encounter: Payer: Self-pay | Admitting: Family

## 2015-09-23 ENCOUNTER — Ambulatory Visit (HOSPITAL_BASED_OUTPATIENT_CLINIC_OR_DEPARTMENT_OTHER): Payer: Medicare Other

## 2015-09-23 ENCOUNTER — Encounter: Payer: Self-pay | Admitting: *Deleted

## 2015-09-23 VITALS — BP 104/81 | HR 106 | Temp 97.7°F | Resp 18 | Ht 61.0 in | Wt 130.0 lb

## 2015-09-23 DIAGNOSIS — D509 Iron deficiency anemia, unspecified: Secondary | ICD-10-CM

## 2015-09-23 DIAGNOSIS — D638 Anemia in other chronic diseases classified elsewhere: Secondary | ICD-10-CM

## 2015-09-23 DIAGNOSIS — M3130 Wegener's granulomatosis without renal involvement: Secondary | ICD-10-CM | POA: Diagnosis not present

## 2015-09-23 DIAGNOSIS — R05 Cough: Secondary | ICD-10-CM | POA: Diagnosis not present

## 2015-09-23 LAB — CBC WITH DIFFERENTIAL (CANCER CENTER ONLY)
BASO#: 0 10*3/uL (ref 0.0–0.2)
BASO%: 0.6 % (ref 0.0–2.0)
EOS%: 3 % (ref 0.0–7.0)
Eosinophils Absolute: 0.1 10*3/uL (ref 0.0–0.5)
HEMATOCRIT: 32.2 % — AB (ref 34.8–46.6)
HGB: 9.9 g/dL — ABNORMAL LOW (ref 11.6–15.9)
LYMPH#: 0.7 10*3/uL — ABNORMAL LOW (ref 0.9–3.3)
LYMPH%: 20.4 % (ref 14.0–48.0)
MCH: 24.6 pg — AB (ref 26.0–34.0)
MCHC: 30.7 g/dL — AB (ref 32.0–36.0)
MCV: 80 fL — AB (ref 81–101)
MONO#: 0.7 10*3/uL (ref 0.1–0.9)
MONO%: 19.3 % — ABNORMAL HIGH (ref 0.0–13.0)
NEUT#: 2.1 10*3/uL (ref 1.5–6.5)
NEUT%: 56.7 % (ref 39.6–80.0)
PLATELETS: 449 10*3/uL — AB (ref 145–400)
RBC: 4.02 10*6/uL (ref 3.70–5.32)
RDW: 22.5 % — AB (ref 11.1–15.7)
WBC: 3.6 10*3/uL — ABNORMAL LOW (ref 3.9–10.0)

## 2015-09-23 LAB — IRON AND TIBC
%SAT: 8 % — ABNORMAL LOW (ref 21–57)
Iron: 29 ug/dL — ABNORMAL LOW (ref 41–142)
TIBC: 371 ug/dL (ref 236–444)
UIBC: 342 ug/dL (ref 120–384)

## 2015-09-23 LAB — FERRITIN: Ferritin: 64 ng/ml (ref 9–269)

## 2015-09-23 LAB — CBC AND DIFFERENTIAL
HEMATOCRIT: 27 % — AB (ref 36–46)
HEMOGLOBIN: 8.4 g/dL — AB (ref 12.0–16.0)
PLATELETS: 353 10*3/uL (ref 150–399)
WBC: 4.4 10*3/mL

## 2015-09-23 LAB — PROTIME-INR: PROTIME: 14.7 s — AB (ref 10.0–13.8)

## 2015-09-23 LAB — POCT INR: INR: 1.1 (ref <=1.1)

## 2015-09-23 MED ORDER — SODIUM CHLORIDE 0.9 % IV SOLN
Freq: Once | INTRAVENOUS | Status: AC
  Administered 2015-09-23: 10:00:00 via INTRAVENOUS

## 2015-09-23 MED ORDER — SODIUM CHLORIDE 0.9 % IV SOLN
510.0000 mg | Freq: Once | INTRAVENOUS | Status: AC
  Administered 2015-09-23: 510 mg via INTRAVENOUS
  Filled 2015-09-23: qty 17

## 2015-09-23 NOTE — Patient Instructions (Signed)

## 2015-09-23 NOTE — Progress Notes (Signed)
Hematology and Oncology Follow Up Visit  [name] [id number] April 19, 1933 82 y.o. 09/23/2015   Principle Diagnosis:  Recurrent iron deficiency anemia Wegener's granulomatosis  Current Therapy:   IV iron as indicated    Interim History:  [name] is here for a follow-up. She is feeling fatigued but ready to have her cochlear implant surgery. She has had to defer on this several several times due to a respiratory infection and now anemia. Her Hgb has improved since last week and is now up to 9.9.  Her surgery has been rescheduled for in April.  She is unsure as to whether or not she is on Coumadin at this time. She had recently stopped due to her INR being high. No episodes of bleeding.  No fever, chills, n/v, cough, rash, dizziness, chest pain, palpitations, abdominal pain, no changes in her bowel or bladder habits. She has trouble with urinary incontinence at night. She is usually up 5-6 times a night.  She has a chronic productive cough (yellow/clear sputum) from the Wegener's granulomatosis. She denies SOB.  No lymphadenopathy found on assessment.  No swelling or tenderness in her extremities at this time. She has neuropathy in her feet that is unchanged.  She uses a motorized scooter to get around in the assisted living facility where she lives and a cane when she goes out. She denies having any falls.  She has maintained a good appetite and is staying hydrated. She does watch her fluid intake though to prevent incontinence.    Medications:    Medication List       This list is accurate as of: 09/23/15  9:22 AM.  Always use your most recent med list.               acetaminophen 650 MG CR tablet  Commonly known as:  TYLENOL  Take 650 mg by mouth 2 (two) times daily. Every 6 hours as needed for pain     b complex vitamins capsule  Take 1 capsule by mouth daily.     calcium-vitamin D 500-200 MG-UNIT tablet  Commonly known as:  OSCAL WITH D  Take 1 tablet by mouth  daily.     cholecalciferol 1000 units tablet  Commonly known as:  VITAMIN D  One daily for vitamin D supplement     fluticasone 50 MCG/ACT nasal spray  Commonly known as:  FLONASE  Place 1 spray into both nostrils every evening.     Fluticasone-Salmeterol 500-50 MCG/DOSE Aepb  Commonly known as:  ADVAIR DISKUS  INHALE 1 PUFF EVERY 12 HOURS.     gabapentin 300 MG capsule  Commonly known as:  NEURONTIN  Take 300 mg by mouth 3 (three) times daily.     guaiFENesin 600 MG 12 hr tablet  Commonly known as:  MUCINEX  Take 600 mg by mouth 2 (two) times daily.     latanoprost 0.005 % ophthalmic solution  Commonly known as:  XALATAN  Place 1 drop into both eyes at bedtime.     levalbuterol 0.31 MG/3ML nebulizer solution  Commonly known as:  XOPENEX  Take 1 ampule by nebulization every 6 (six) hours as needed for wheezing.     LORazepam 0.5 MG tablet  Commonly known as:  ATIVAN  Take 0.5 mg by mouth every 6 (six) hours as needed for anxiety.     multivitamin with minerals tablet  Take 1 tablet by mouth daily.     PROTONIX 40 MG tablet  Generic drug:  pantoprazole  Take 40 mg by mouth daily.     saccharomyces boulardii 250 MG capsule  Commonly known as:  FLORASTOR  Take 250 mg by mouth 2 (two) times daily.     sulfamethoxazole-trimethoprim 800-160 MG tablet  Commonly known as:  BACTRIM DS,SEPTRA DS  Take 1 tablet by mouth every Monday, Wednesday, and Friday. HOLD WHILE ON AUGMENTIN, THEN RESUME AS PREVIOUS     timolol 0.5 % ophthalmic solution  Commonly known as:  BETIMOL  Place 1 drop into the left eye daily with breakfast.     warfarin 5 MG tablet  Commonly known as:  COUMADIN  Take 5 mg by mouth daily.        Allergies:  Allergies  Allergen Reactions  . Adhesive [Tape] Rash    On on lower extremities     Past Medical History, Surgical history, Social history, and Family History were reviewed and updated.  Review of Systems: All other 10 point review of  systems is negative.   Physical Exam:  vitals were not taken for this visit.  Wt Readings from Last 3 Encounters:  09/17/15 133 lb (60.328 kg)  09/15/15 133 lb 6 oz (60.499 kg)  09/09/15 132 lb (59.875 kg)    Ocular: Sclerae unicteric, pupils equal, round and reactive to light Ear-nose-throat: Oropharynx clear, dentition fair Lymphatic: No cervical supraclavicular or axillary adenopathy Lungs no rales or rhonchi, good excursion bilaterally Heart regular rate and rhythm, no murmur appreciated Abd soft, nontender, positive bowel sounds, no liver or spleen tip palpated on exam, no fluid wave  MSK no focal spinal tenderness, no joint edema Neuro: non-focal, well-oriented, appropriate affect Breasts: Deferred  Lab Results  Component Value Date   WBC 3.4 09/16/2015   HGB 7.4* 09/16/2015   HCT 25* 09/16/2015   MCV 76.9* 09/15/2015   PLT 373 09/16/2015   Lab Results  Component Value Date   FERRITIN 146 02/03/2015   IRON 39* 02/03/2015   TIBC 233* 02/03/2015   UIBC 194 02/03/2015   IRONPCTSAT 17* 02/03/2015   Lab Results  Component Value Date   RETICCTPCT 1.6 02/03/2015   RBC 3.68* 09/15/2015   RETICCTABS 62.4 02/03/2015   No results found for: KPAFRELGTCHN, LAMBDASER, KAPLAMBRATIO No results found for: IGGSERUM, IGA, IGMSERUM No results found for: Odetta Pink, SPEI   Chemistry      Component Value Date/Time   NA 141 09/15/2015 1342   NA 141 09/15/2015   K 4.1 09/15/2015 1342   CL 109 09/15/2015 1342   CO2 23 09/15/2015 1342   BUN 13 09/15/2015 1342   BUN 13 09/15/2015   CREATININE 0.87 09/15/2015 1342   CREATININE 0.9 09/15/2015   GLU 97 09/15/2015      Component Value Date/Time   CALCIUM 9.9 09/15/2015 1342   ALKPHOS 102 08/12/2015   AST 21 08/12/2015   ALT 15 08/12/2015   BILITOT 0.6 07/28/2014 0545     Impression and Plan: [name] is 82 year old female with iron deficiency anemia and a history of  wegener's granulomatosis. She is here today with anemia and symptomatic with fatigue. Her Hgb is 9.9 with an MCV of 80.  We will go ahead and give her a dose of Feraheme while she is here today. We will see what her iron studies show and whether or not she needs to come back in next week for a second dose.  We will plan to see her back in 2 months for labs  and follow-up.  She knows to call with any questions or concerns. We can certainly see her sooner if need be.   Eliezer Bottom, NP 3/23/20179:22 AM

## 2015-09-24 LAB — RETICULOCYTES: RETICULOCYTE COUNT: 3.7 % — AB (ref 0.6–2.6)

## 2015-09-28 LAB — CBC AND DIFFERENTIAL
HEMATOCRIT: 31 % — AB (ref 36–46)
HEMOGLOBIN: 9.6 g/dL — AB (ref 12.0–16.0)
Platelets: 384 10*3/uL (ref 150–399)
WBC: 4.3 10*3/mL

## 2015-10-05 LAB — CBC AND DIFFERENTIAL
HEMATOCRIT: 36 % (ref 36–46)
HEMOGLOBIN: 11.1 g/dL — AB (ref 12.0–16.0)
PLATELETS: 354 10*3/uL (ref 150–399)
WBC: 3.8 10*3/mL

## 2015-10-05 LAB — PROTIME-INR: Protime: 20.7 seconds — AB (ref 10.0–13.8)

## 2015-10-05 LAB — POCT INR: INR: 1.8 — AB (ref 0.9–1.1)

## 2015-10-07 ENCOUNTER — Encounter: Payer: Self-pay | Admitting: Nurse Practitioner

## 2015-10-07 NOTE — Progress Notes (Signed)
This encounter was created in error - please disregard.

## 2015-10-21 ENCOUNTER — Non-Acute Institutional Stay: Payer: Medicare Other | Admitting: Nurse Practitioner

## 2015-10-21 DIAGNOSIS — D62 Acute posthemorrhagic anemia: Secondary | ICD-10-CM

## 2015-10-21 DIAGNOSIS — Z7901 Long term (current) use of anticoagulants: Secondary | ICD-10-CM

## 2015-10-21 DIAGNOSIS — G5793 Unspecified mononeuropathy of bilateral lower limbs: Secondary | ICD-10-CM

## 2015-10-21 DIAGNOSIS — D638 Anemia in other chronic diseases classified elsewhere: Secondary | ICD-10-CM

## 2015-10-21 DIAGNOSIS — K219 Gastro-esophageal reflux disease without esophagitis: Secondary | ICD-10-CM

## 2015-10-21 DIAGNOSIS — M3130 Wegener's granulomatosis without renal involvement: Secondary | ICD-10-CM

## 2015-10-21 DIAGNOSIS — R269 Unspecified abnormalities of gait and mobility: Secondary | ICD-10-CM

## 2015-10-21 NOTE — Assessment & Plan Note (Signed)
10/05/15 Hgb 11.1 Continue FE

## 2015-10-21 NOTE — Assessment & Plan Note (Signed)
Coumadin resumed after acute anemia is resolved, currently Hgb 11s 10/2015, Hx of PE

## 2015-10-21 NOTE — Progress Notes (Signed)
Patient ID: [name], female   DOB: 1933-03-12, 82 y.o.   MRN: [id number]  Location:  [facility] Room Number: [id number] Place of Service: [facility] Provider:  Lennie Odor Krithik Mapel NP  GREEN, Viviann Spare, MD  Patient Care Team: Estill Dooms, MD as PCP - General (Internal Medicine) Unice Bailey, MD as Consulting Physician (Rheumatology) Rozetta Nunnery, MD as Consulting Physician (Otolaryngology) Marcial Pacas, MD as Consulting Physician (Neurology) Michel Bickers, MD as Consulting Physician (Infectious Diseases) Volanda Napoleon, MD as Consulting Physician (Oncology) Juanito Doom, MD as Consulting Physician (Pulmonary Disease) Tanda Rockers, MD as Consulting Physician (Pulmonary Disease) Estill Dooms, MD as Consulting Physician (Geriatric Medicine) Coralie Keens, MD as Consulting Physician (General Surgery) Lakea Mittelman X, NP as Nurse Practitioner (Nurse Practitioner)  Extended Emergency Contact Information Primary Emergency Contact: Haygood,Sandra Address: [address] [place] [address] Johnnette Litter of [place] Phone: [phone] Relation: Daughter Secondary Emergency Contact: Overstreet,[name] Address: [address] [place], [place] [address] [place] Phone: [phone] Mobile Phone: [phone] Relation: Son  Code Status:  DNR Goals of care: Advanced Directive information Advanced Directives 10/21/2015  Does patient have an advance directive? Yes  Type of Advance Directive Living will;Healthcare Power of Attorney  Does patient want to make changes to advanced directive? No - Patient declined  Copy of advanced directive(s) in chart? Yes     Chief Complaint  Patient presents with  . Medical Management of Chronic Issues    Routine Visit    HPI:  Pt is a 82 y.o. female seen today for medical management of chronic diseases. Anemia, Hgb improved to 11.1 09/15/15, taking Fe, workup and tx under  Hematology. Peripheral neuropathy, w/c for mobility, takes Gabapentin 300mg  tid. GERD symptomatic free on Omeprazole 20mg  daily.    Past Medical History  Diagnosis Date  . Wegener's granulomatosis ([place])   . OA (osteoarthritis)   . Blindness of one eye   . Hearing loss in left ear     hearing aid both ears  . Bladder incontinence   . Glaucoma   . Clotting disorder ([place])   . History of pulmonary embolism 1997  . Pneumonia 12/15  . Multiple allergies   . Chronic sinusitis   . Acute respiratory failure with hypoxia ([place])   . Interstitial emphysema ([place])   . Aspergillosis ([place])   . Stroke [facility])     ??? blind left eye  . Cancer ([place])     melanoma   (chemo)  . Difficult intubation     Difficult airway with intubation for ARF 07/22/14 (due to anterior larynx, also had mucous plug)  . Anemia   . Neuropathy ([place])   . Shortness of breath dyspnea    Past Surgical History  Procedure Laterality Date  . Tubal ligation    . Pubovaginal sling    . Cataract extraction    . Melanoma excision      left leg  . Vena cava filter placement  1997    [place] filter  . Knee arthroscopy  2006    rt   . Inguinal hernia repair Left 02/17/2014    Procedure: LEFT INGUINAL HERNIA REPAIR WITH MESH;  Surgeon: Harl Bowie, MD;  Location: [facility];  Service: General;  Laterality: Left;  . Insertion of mesh N/A 02/17/2014  Procedure: INSERTION OF MESH;  Surgeon: Harl Bowie, MD;  Location: [facility];  Service: General;  Laterality: N/A;  . Tear duct probing    . Tympanomastoidectomy Left 04/28/2015  . Tympanomastoidectomy Left 04/28/2015    Procedure: LEFT TYMPANOMASTOIDECTOMY;  Surgeon: Leta Baptist, MD;  Location: [facility];  Service: ENT;  Laterality: Left;  . Bronchosocpy      pt. states she has had over 10 in last 20 yrs.    Allergies  Allergen Reactions  . Adhesive [Tape] Rash    On on lower extremities       Medication List       This list is  accurate as of: 10/21/15  1:51 PM.  Always use your most recent med list.               acetaminophen 650 MG CR tablet  Commonly known as:  TYLENOL  Take 650 mg by mouth 2 (two) times daily. Every 6 hours as needed for pain     b complex vitamins capsule  Take 1 capsule by mouth daily.     calcium-vitamin D 500-200 MG-UNIT tablet  Commonly known as:  OSCAL WITH D  Take 1 tablet by mouth daily.     cholecalciferol 1000 units tablet  Commonly known as:  VITAMIN D  One daily for vitamin D supplement     ferrous sulfate 325 (65 FE) MG tablet  Take 325 mg by mouth 2 (two) times daily with a meal.     fluticasone 50 MCG/ACT nasal spray  Commonly known as:  FLONASE  Place 1 spray into both nostrils every evening.     Fluticasone-Salmeterol 500-50 MCG/DOSE Aepb  Commonly known as:  ADVAIR DISKUS  INHALE 1 PUFF EVERY 12 HOURS.     gabapentin 300 MG capsule  Commonly known as:  NEURONTIN  Take 300 mg by mouth 3 (three) times daily.     guaiFENesin 600 MG 12 hr tablet  Commonly known as:  MUCINEX  Take 600 mg by mouth 2 (two) times daily.     latanoprost 0.005 % ophthalmic solution  Commonly known as:  XALATAN  Place 1 drop into both eyes at bedtime.     levalbuterol 0.63 MG/3ML nebulizer solution  Commonly known as:  XOPENEX  Take 0.63 mg by nebulization every 6 (six) hours as needed for wheezing or shortness of breath.     multivitamin with minerals tablet  Take 1 tablet by mouth daily.     omeprazole 20 MG capsule  Commonly known as:  PRILOSEC  Take 20 mg by mouth daily.     saccharomyces boulardii 250 MG capsule  Commonly known as:  FLORASTOR  Take 250 mg by mouth 2 (two) times daily.     sulfamethoxazole-trimethoprim 800-160 MG tablet  Commonly known as:  BACTRIM DS,SEPTRA DS  Take 1 tablet by mouth every Monday, Wednesday, and Friday. HOLD WHILE ON AUGMENTIN, THEN RESUME AS PREVIOUS     timolol 0.5 % ophthalmic solution  Commonly known as:  BETIMOL  Place 1  drop into the left eye daily with breakfast.     warfarin 5 MG tablet  Commonly known as:  COUMADIN  Take 5 mg by mouth daily.        Review of Systems  Constitutional: Negative for fever, chills and diaphoresis.       Frail and generally weak.  HENT: Positive for hearing loss (Deaf on left, hearing aid on right ) and nosebleeds. Negative  for congestion, ear discharge, ear pain and tinnitus.        Left cochlear implant pending.  Eyes: Negative.  Negative for pain and redness.  Respiratory: Positive for cough and shortness of breath. Negative for wheezing (Intermittent).        History Wegener's granulomatosis. History of chronic sinusitis related to her Wegener's. Chronic cough and yellowish phlegm production   Cardiovascular: Positive for leg swelling. Negative for chest pain and palpitations.       Trace R+L foot/ankle, R>L  Gastrointestinal: Negative for nausea, abdominal pain, diarrhea and constipation.       History of GERD.  Endocrine: Negative for polydipsia.  Genitourinary: Positive for frequency. Negative for dysuria, urgency, hematuria and flank pain.       Frequency induced by furosemide. Episodes of incontinence of urine.  Musculoskeletal: Positive for arthralgias and gait problem. Negative for myalgias, back pain and neck pain.       Generalized weakness Right foot pain, worsens with walking Left upper extremity pain   Skin: Negative.  Negative for rash.  Neurological: Positive for weakness. Negative for dizziness, tremors, seizures and headaches.       Neuropathic leg/foot pain. Generalized weakness BLE  Hematological: Does not bruise/bleed easily.       Chronic anemia.  Psychiatric/Behavioral: Negative for suicidal ideas and hallucinations. The patient is nervous/anxious.        Recently her husband has been admitted to SNF    Immunization History  Administered Date(s) Administered  . Influenza Split 03/04/2012, 03/11/2014, 04/01/2015  . Influenza,inj,Quad  PF,36+ Mos 03/03/2013  . Influenza-Unspecified 04/01/2015  . PPD Test 05/25/2015  . Pneumococcal Conjugate-13 07/03/2009  . Pneumococcal Polysaccharide-23 08/07/2014  . Zoster 09/30/1994   Pertinent  Health Maintenance Due  Topic Date Due  . INFLUENZA VACCINE  02/01/2016  . DEXA SCAN  Completed  . PNA vac Low Risk Adult  Completed   Fall Risk  09/23/2015 09/09/2015 02/03/2015 12/04/2014 11/26/2014  Falls in the past year? Yes Yes Yes Yes Yes  Number falls in past yr: 2 or more 2 or more 2 or more 2 or more 2 or more  Injury with Fall? No No - No No   Functional Status Survey:    There were no vitals filed for this visit. There is no weight on file to calculate BMI. Physical Exam  Constitutional: She is oriented to person, place, and time. She appears well-developed. No distress.  Thin and frail. Weight stable  since 3/16  HENT:  Head: Normocephalic and atraumatic.  Nose: Nose normal.  Mouth/Throat: Oropharynx is clear and moist. No oropharyngeal exudate.  Bilateral hearing loss. Hearing aid - right ear Pending left cochlear implant  Eyes: Conjunctivae and EOM are normal. Pupils are equal, round, and reactive to light. Right eye exhibits no discharge. Left eye exhibits no discharge. No scleral icterus.  Neck: Normal range of motion. Neck supple. No JVD present. No tracheal deviation present. No thyromegaly present.  Cardiovascular: Normal rate, regular rhythm, normal heart sounds and intact distal pulses.   No murmur heard. Pulmonary/Chest: Effort normal. No stridor. No respiratory distress. She has no wheezes. She has rales. She exhibits no tenderness.  Abdominal: Soft. Bowel sounds are normal. She exhibits no distension and no mass. There is no tenderness. There is no rebound and no guarding.  Musculoskeletal: Normal range of motion. She exhibits edema (trace) and tenderness.  R+L foot/ankle, R>L Left upper extremity pain  Lymphadenopathy:    She has no cervical adenopathy.  Neurological: She is alert and oriented to person, place, and time. She has normal reflexes. No cranial nerve deficit. She exhibits normal muscle tone. Coordination normal.  The patient has history of neuropathy of bilateral lower extremities which resulted in generalized weakness and pain of legs, she is only able to ambulates a few steps with walker in her room and depend on w/c to go further. In order to maintain her quality of life and mobility, she needs wheelchair to travel to dinning room for meals and attend activities on the [place] where she resides. Due to her left upper extremity pain related to an injury occurred 4-5 years, she is unable to propel manual wheelchair or steer a scooter in her living space to maintain her mobility. Mrs Smid manages her own affairs and finances with minimal assistance. She has worked with therapy in the past and has been deemed to be safe to operate a power chair.    Skin: Skin is warm and dry. No rash noted. She is not diaphoretic. No erythema. No pallor.  Corns on the left foot at the tip of the third toe and at the fifth metacarpal phalangeal joint slightly lateral.   Psychiatric: She has a normal mood and affect. Her behavior is normal. Judgment and thought content normal.  Emotional when voice her worry not able to walk to SNF to visit her busband.     Labs reviewed:  Recent Labs  04/20/15 1409  08/12/15 09/15/15 09/15/15 1342  NA 140  < > 138 141 141  K 4.2  < > 4.2 4.1 4.1  CL 106  --   --   --  109  CO2 28  --   --   --  23  GLUCOSE 104*  --   --   --  97  BUN 12  < > 13 13 13   CREATININE 0.79  < > 0.8 0.9 0.87  CALCIUM 10.0  --   --   --  9.9  < > = values in this interval not displayed.  Recent Labs  05/13/15 07/25/15 08/12/15  AST 19 22 21   ALT 11 14 15   ALKPHOS 108 178* 102    Recent Labs  11/23/14 1126 02/03/15 1335  04/20/15 1409  09/15/15 1342  09/23/15 0908 09/28/15 10/05/15  WBC 5.6 5.1  < > 5.8  < >  4.9  < > 3.6* 4.3 3.8  NEUTROABS 3.9 3.4  --   --   --   --   --  2.1  --   --   HGB 12.8 12.3  --  11.7*  < > 8.6*  < > 9.9* 9.6* 11.1*  HCT 38.5 37.0  --  36.3  < > 28.3*  < > 32.2* 31* 36  MCV 96 96  --  90.8  --  76.9*  --  80*  --   --   PLT 345 302  --  311  < > 422*  < > 449* 384 354  < > = values in this interval not displayed. Lab Results  Component Value Date   TSH 1.56 05/13/2015   Lab Results  Component Value Date   HGBA1C 5.8* 07/28/2014   No results found for: CHOL, HDL, LDLCALC, LDLDIRECT, TRIG, CHOLHDL  Significant Diagnostic Results in last 30 days:  No results found.  Assessment/Plan  Abnormality of gait W/c for mobility  Acute blood loss anemia 10/05/15 Hgb 11.1 Continue FE  Anemia of chronic disease  10/05/15 Hgb 11.1   GERD (gastroesophageal reflux disease) Stable, continue Omeprazole 20mg  daily  Long term current use of anticoagulant therapy Coumadin resumed after acute anemia is resolved, currently Hgb 11s 10/2015, Hx of PE  Mononeuropathy of both lower extremities Pain is managed with Gabapentin 300mg  bid.    WEGENERS GRANULOMATOSIS 09/03/15 CXR no acute pathology is identified.      Family/ staff Communication: supervision for safety.   Labs/tests ordered: none

## 2015-10-21 NOTE — Assessment & Plan Note (Signed)
09/03/15 CXR no acute pathology is identified.

## 2015-10-21 NOTE — Assessment & Plan Note (Signed)
Stable, continue Omeprazole 20mg daily.  

## 2015-10-21 NOTE — Assessment & Plan Note (Signed)
Pain is managed with Gabapentin 300mg  bid.

## 2015-10-21 NOTE — Assessment & Plan Note (Signed)
W/c for mobility °

## 2015-10-21 NOTE — Assessment & Plan Note (Signed)
10/05/15 Hgb 11.1

## 2015-10-26 ENCOUNTER — Other Ambulatory Visit: Payer: Self-pay | Admitting: Otolaryngology

## 2015-10-26 NOTE — Pre-Procedure Instructions (Signed)
    [name]  10/26/2015    Your procedure is scheduled on Wednesday, April 26.  Report to [facility] Admitting at 6:30 AM   Call this number if you have problems the morning of surgery:[phone]   Remember:  Do not eat food or drink liquids after midnight.  Take these medicines the morning of surgery with A SIP OF WATER: Prilosec, Mucinex, Gabapentin.  May use Eye Drops , Advair and nasal Spray. May take Tylenol if needed.   Do not wear jewelry, make-up or nail polish.  Do not wear lotions, powders, or perfumes.    Do not shave 48 hours prior to surgery.  Do not bring valuables to the hospital.  [facility] is not responsible for any belongings or valuables

## 2015-10-27 ENCOUNTER — Encounter (HOSPITAL_COMMUNITY)
Admission: RE | Disposition: A | Discharge status: Home / Self Care | Payer: Self-pay | Source: Ambulatory Visit | Attending: Otolaryngology

## 2015-10-27 ENCOUNTER — Encounter (HOSPITAL_COMMUNITY): Payer: Self-pay | Admitting: *Deleted

## 2015-10-27 ENCOUNTER — Ambulatory Visit (HOSPITAL_COMMUNITY): Payer: Medicare Other | Admitting: Certified Registered Nurse Anesthetist

## 2015-10-27 ENCOUNTER — Ambulatory Visit (HOSPITAL_BASED_OUTPATIENT_CLINIC_OR_DEPARTMENT_OTHER)
Admission: RE | Admit: 2015-10-27 | Discharge: 2015-10-28 | Disposition: A | Discharge status: Home / Self Care | Payer: Medicare Other | Source: Ambulatory Visit | Attending: Otolaryngology | Admitting: Otolaryngology

## 2015-10-27 ENCOUNTER — Ambulatory Visit (HOSPITAL_COMMUNITY): Payer: Medicare Other | Admitting: Vascular Surgery

## 2015-10-27 ENCOUNTER — Ambulatory Visit (HOSPITAL_COMMUNITY): Payer: Medicare Other

## 2015-10-27 DIAGNOSIS — Z87891 Personal history of nicotine dependence: Secondary | ICD-10-CM | POA: Diagnosis not present

## 2015-10-27 DIAGNOSIS — J45909 Unspecified asthma, uncomplicated: Secondary | ICD-10-CM | POA: Insufficient documentation

## 2015-10-27 DIAGNOSIS — J449 Chronic obstructive pulmonary disease, unspecified: Secondary | ICD-10-CM | POA: Diagnosis not present

## 2015-10-27 DIAGNOSIS — J189 Pneumonia, unspecified organism: Secondary | ICD-10-CM | POA: Diagnosis not present

## 2015-10-27 DIAGNOSIS — K219 Gastro-esophageal reflux disease without esophagitis: Secondary | ICD-10-CM | POA: Diagnosis not present

## 2015-10-27 DIAGNOSIS — H903 Sensorineural hearing loss, bilateral: Secondary | ICD-10-CM | POA: Insufficient documentation

## 2015-10-27 DIAGNOSIS — Z888 Allergy status to other drugs, medicaments and biological substances status: Secondary | ICD-10-CM | POA: Diagnosis not present

## 2015-10-27 DIAGNOSIS — Z7901 Long term (current) use of anticoagulants: Secondary | ICD-10-CM | POA: Diagnosis not present

## 2015-10-27 DIAGNOSIS — Z9621 Cochlear implant status: Secondary | ICD-10-CM

## 2015-10-27 HISTORY — PX: COCHLEAR IMPLANT: SHX184

## 2015-10-27 LAB — CBC
HCT: 34.2 % — ABNORMAL LOW (ref 36.0–46.0)
HEMOGLOBIN: 10.7 g/dL — AB (ref 12.0–15.0)
MCH: 26.3 pg (ref 26.0–34.0)
MCHC: 31.3 g/dL (ref 30.0–36.0)
MCV: 84 fL (ref 78.0–100.0)
PLATELETS: 324 10*3/uL (ref 150–400)
RBC: 4.07 MIL/uL (ref 3.87–5.11)
RDW: 24.3 % — ABNORMAL HIGH (ref 11.5–15.5)
WBC: 4.4 10*3/uL (ref 4.0–10.5)

## 2015-10-27 LAB — PROTIME-INR
INR: 1.25 (ref 0.00–1.49)
PROTHROMBIN TIME: 15.8 s — AB (ref 11.6–15.2)

## 2015-10-27 LAB — SURGICAL PCR SCREEN
MRSA, PCR: NEGATIVE
STAPHYLOCOCCUS AUREUS: NEGATIVE

## 2015-10-27 SURGERY — INSERTION, IMPLANT, COCHLEAR
Anesthesia: General | Site: Head | Laterality: Left

## 2015-10-27 MED ORDER — PROPOFOL 10 MG/ML IV BOLUS
INTRAVENOUS | Status: DC | PRN
  Administered 2015-10-27: 110 mg via INTRAVENOUS

## 2015-10-27 MED ORDER — LATANOPROST 0.005 % OP SOLN
1.0000 [drp] | Freq: Every day | OPHTHALMIC | Status: DC
  Administered 2015-10-27: 1 [drp] via OPHTHALMIC
  Filled 2015-10-27: qty 2.5

## 2015-10-27 MED ORDER — CEFAZOLIN SODIUM-DEXTROSE 2-3 GM-% IV SOLR
INTRAVENOUS | Status: DC | PRN
  Administered 2015-10-27: 2 g via INTRAVENOUS

## 2015-10-27 MED ORDER — ONDANSETRON HCL 4 MG/2ML IJ SOLN
4.0000 mg | INTRAMUSCULAR | Status: DC | PRN
  Administered 2015-10-28: 4 mg via INTRAVENOUS
  Filled 2015-10-27: qty 2

## 2015-10-27 MED ORDER — SODIUM CHLORIDE 0.9 % IJ SOLN
INTRAMUSCULAR | Status: AC
  Filled 2015-10-27: qty 20

## 2015-10-27 MED ORDER — LIDOCAINE HCL (CARDIAC) 20 MG/ML IV SOLN
INTRAVENOUS | Status: AC
Start: 2015-10-27 — End: 2015-10-27
  Filled 2015-10-27: qty 5

## 2015-10-27 MED ORDER — CEFAZOLIN SODIUM 1 G IJ SOLR
INTRAMUSCULAR | Status: AC
  Filled 2015-10-27: qty 20

## 2015-10-27 MED ORDER — FERROUS SULFATE 325 (65 FE) MG PO TABS
325.0000 mg | ORAL_TABLET | Freq: Two times a day (BID) | ORAL | Status: DC
  Administered 2015-10-27 – 2015-10-28 (×2): 325 mg via ORAL
  Filled 2015-10-27 (×2): qty 1

## 2015-10-27 MED ORDER — PANTOPRAZOLE SODIUM 40 MG PO TBEC
40.0000 mg | DELAYED_RELEASE_TABLET | Freq: Every day | ORAL | Status: DC
  Administered 2015-10-28: 40 mg via ORAL
  Filled 2015-10-27: qty 1

## 2015-10-27 MED ORDER — TIMOLOL MALEATE 0.5 % OP SOLN
1.0000 [drp] | Freq: Every day | OPHTHALMIC | Status: DC
  Administered 2015-10-28: 1 [drp] via OPHTHALMIC
  Filled 2015-10-27: qty 5

## 2015-10-27 MED ORDER — FENTANYL CITRATE (PF) 250 MCG/5ML IJ SOLN
INTRAMUSCULAR | Status: AC
  Filled 2015-10-27: qty 5

## 2015-10-27 MED ORDER — OXYCODONE-ACETAMINOPHEN 5-325 MG PO TABS
1.0000 | ORAL_TABLET | ORAL | Status: DC | PRN
  Administered 2015-10-27: 2 via ORAL
  Administered 2015-10-28: 1 via ORAL
  Filled 2015-10-27: qty 1
  Filled 2015-10-27: qty 2

## 2015-10-27 MED ORDER — KCL IN DEXTROSE-NACL 20-5-0.45 MEQ/L-%-% IV SOLN
INTRAVENOUS | Status: DC
  Administered 2015-10-27 – 2015-10-28 (×2): via INTRAVENOUS
  Filled 2015-10-27 (×2): qty 1000

## 2015-10-27 MED ORDER — EPINEPHRINE HCL (NASAL) 0.1 % NA SOLN
NASAL | Status: DC | PRN
  Administered 2015-10-27: 1 mL via TOPICAL

## 2015-10-27 MED ORDER — GUAIFENESIN ER 600 MG PO TB12
600.0000 mg | ORAL_TABLET | Freq: Two times a day (BID) | ORAL | Status: DC
  Administered 2015-10-27 – 2015-10-28 (×2): 600 mg via ORAL
  Filled 2015-10-27 (×2): qty 1

## 2015-10-27 MED ORDER — ONDANSETRON HCL 4 MG/2ML IJ SOLN
INTRAMUSCULAR | Status: AC
  Filled 2015-10-27: qty 2

## 2015-10-27 MED ORDER — LIDOCAINE HCL (CARDIAC) 20 MG/ML IV SOLN
INTRAVENOUS | Status: DC | PRN
Start: 2015-10-27 — End: 2015-10-27
  Administered 2015-10-27: 40 mg via INTRAVENOUS

## 2015-10-27 MED ORDER — PROPOFOL 10 MG/ML IV BOLUS
INTRAVENOUS | Status: AC
  Filled 2015-10-27: qty 20

## 2015-10-27 MED ORDER — CALCIUM CARBONATE-VITAMIN D 500-200 MG-UNIT PO TABS
1.0000 | ORAL_TABLET | Freq: Every day | ORAL | Status: DC
  Administered 2015-10-28: 1 via ORAL
  Filled 2015-10-27: qty 1

## 2015-10-27 MED ORDER — SUCCINYLCHOLINE CHLORIDE 20 MG/ML IJ SOLN
INTRAMUSCULAR | Status: DC | PRN
  Administered 2015-10-27: 100 mg via INTRAVENOUS

## 2015-10-27 MED ORDER — EPINEPHRINE HCL 1 MG/ML IJ SOLN
INTRAMUSCULAR | Status: AC
  Filled 2015-10-27: qty 1

## 2015-10-27 MED ORDER — MUPIROCIN 2 % EX OINT
1.0000 "application " | TOPICAL_OINTMENT | Freq: Once | CUTANEOUS | Status: AC
  Administered 2015-10-27: 1 via TOPICAL
  Filled 2015-10-27: qty 22

## 2015-10-27 MED ORDER — LACTATED RINGERS IV SOLN
INTRAVENOUS | Status: DC | PRN
  Administered 2015-10-27: 08:00:00 via INTRAVENOUS

## 2015-10-27 MED ORDER — PHENYLEPHRINE HCL 10 MG/ML IJ SOLN
INTRAMUSCULAR | Status: DC | PRN
  Administered 2015-10-27: 120 ug via INTRAVENOUS

## 2015-10-27 MED ORDER — SODIUM CHLORIDE 0.9 % IR SOLN
Status: DC | PRN
  Administered 2015-10-27: 1000 mL

## 2015-10-27 MED ORDER — TIMOLOL HEMIHYDRATE 0.5 % OP SOLN: 1.0000 [drp] | Freq: Every day | OPHTHALMIC | Status: DC

## 2015-10-27 MED ORDER — SACCHAROMYCES BOULARDII 250 MG PO CAPS
250.0000 mg | ORAL_CAPSULE | Freq: Two times a day (BID) | ORAL | Status: DC
  Administered 2015-10-27 – 2015-10-28 (×2): 250 mg via ORAL
  Filled 2015-10-27 (×2): qty 1

## 2015-10-27 MED ORDER — FENTANYL CITRATE (PF) 100 MCG/2ML IJ SOLN
INTRAMUSCULAR | Status: DC | PRN
Start: 2015-10-27 — End: 2015-10-27
  Administered 2015-10-27: 50 ug via INTRAVENOUS
  Administered 2015-10-27: 100 ug via INTRAVENOUS

## 2015-10-27 MED ORDER — LIDOCAINE-EPINEPHRINE 1 %-1:100000 IJ SOLN
INTRAMUSCULAR | Status: DC | PRN
  Administered 2015-10-27: 20 mL via INTRADERMAL

## 2015-10-27 MED ORDER — GABAPENTIN 300 MG PO CAPS
300.0000 mg | ORAL_CAPSULE | Freq: Three times a day (TID) | ORAL | Status: DC
Start: 2015-10-27 — End: 2015-10-28
  Administered 2015-10-27 – 2015-10-28 (×2): 300 mg via ORAL
  Filled 2015-10-27 (×2): qty 1

## 2015-10-27 MED ORDER — ONDANSETRON HCL 4 MG PO TABS
4.0000 mg | ORAL_TABLET | ORAL | Status: DC | PRN
  Administered 2015-10-28: 4 mg via ORAL
  Filled 2015-10-27: qty 1

## 2015-10-27 MED ORDER — MOMETASONE FURO-FORMOTEROL FUM 200-5 MCG/ACT IN AERO
2.0000 | INHALATION_SPRAY | Freq: Two times a day (BID) | RESPIRATORY_TRACT | Status: DC
  Administered 2015-10-27: 2 via RESPIRATORY_TRACT
  Filled 2015-10-27: qty 8.8

## 2015-10-27 MED ORDER — ONDANSETRON HCL 4 MG/2ML IJ SOLN
INTRAMUSCULAR | Status: DC | PRN
  Administered 2015-10-27: 4 mg via INTRAVENOUS

## 2015-10-27 MED ORDER — SODIUM CHLORIDE 0.9 % IV SOLN
10.0000 mg | INTRAVENOUS | Status: DC | PRN
  Administered 2015-10-27: 50 ug/min via INTRAVENOUS

## 2015-10-27 MED ORDER — MORPHINE SULFATE (PF) 2 MG/ML IV SOLN: 2.0000 mg | INTRAVENOUS | Status: DC | PRN

## 2015-10-27 MED ORDER — FENTANYL CITRATE (PF) 100 MCG/2ML IJ SOLN: 25.0000 ug | INTRAMUSCULAR | Status: DC | PRN

## 2015-10-27 MED ORDER — VITAMIN D3 25 MCG (1000 UNIT) PO TABS
1000.0000 [IU] | ORAL_TABLET | Freq: Every day | ORAL | Status: DC
  Administered 2015-10-28: 1000 [IU] via ORAL
  Filled 2015-10-27 (×2): qty 1

## 2015-10-27 MED ORDER — LIDOCAINE-EPINEPHRINE 1 %-1:100000 IJ SOLN
INTRAMUSCULAR | Status: AC
  Filled 2015-10-27: qty 1

## 2015-10-27 SURGICAL SUPPLY — 60 items
ADH SKN CLS APL DERMABOND .7 (GAUZE/BANDAGES/DRESSINGS) ×2
BLADE 10 SAFETY STRL DISP (BLADE) ×3 IMPLANT
BLADE SURG ROTATE 9660 (MISCELLANEOUS) ×3 IMPLANT
BUR DIAMOND COARSE 2.0 (BURR) ×2 IMPLANT
BUR ROUND FLUTED 5 RND (BURR) ×2 IMPLANT
BUR ROUND FLUTED 5MM RND (BURR) ×1
BUR SABER DIAMOND 3.0 (BURR) ×2 IMPLANT
BUR SABER RD CUTTING 3.0 (BURR) IMPLANT
BUR SABER RD CUTTING 3.0MM (BURR)
BUR SABER TAPERED DIAMOND 1 (BURR) ×3 IMPLANT
CANISTER SUCTION 2500CC (MISCELLANEOUS) ×3 IMPLANT
CLEANER TIP ELECTROSURG 2X2 (MISCELLANEOUS) ×3 IMPLANT
COVER MAYO STAND STRL (DRAPES) ×3 IMPLANT
COVER PROBE W GEL 5X96 (DRAPES) ×2 IMPLANT
COVER SURGICAL LIGHT HANDLE (MISCELLANEOUS) ×3 IMPLANT
DECANTER SPIKE VIAL GLASS SM (MISCELLANEOUS) ×3 IMPLANT
DERMABOND ADVANCED (GAUZE/BANDAGES/DRESSINGS) ×4
DERMABOND ADVANCED .7 DNX12 (GAUZE/BANDAGES/DRESSINGS) ×2 IMPLANT
DRAPE MICROSCOPE LEICA (MISCELLANEOUS) ×3 IMPLANT
DRAPE NEUROLOGICAL W/INCISE (DRAPES) ×3 IMPLANT
DRAPE POUCH INSTRU U-SHP 10X18 (DRAPES) ×3 IMPLANT
DRAPE PROXIMA HALF (DRAPES) ×3 IMPLANT
DRAPE SURG 17X23 STRL (DRAPES) ×2 IMPLANT
DRSG GLASSCOCK MASTOID ADT (GAUZE/BANDAGES/DRESSINGS) ×3 IMPLANT
ELECT COATED BLADE 2.86 ST (ELECTRODE) ×3 IMPLANT
ELECT PAIRED SUBDERMAL (MISCELLANEOUS)
ELECT REM PT RETURN 9FT ADLT (ELECTROSURGICAL) ×3
ELECTRODE PAIRED SUBDERMAL (MISCELLANEOUS) IMPLANT
ELECTRODE REM PT RTRN 9FT ADLT (ELECTROSURGICAL) ×1 IMPLANT
GAUZE SPONGE 4X4 12PLY STRL (GAUZE/BANDAGES/DRESSINGS) ×3 IMPLANT
GAUZE SPONGE 4X4 16PLY XRAY LF (GAUZE/BANDAGES/DRESSINGS) ×3 IMPLANT
GLOVE BIOGEL PI IND STRL 7.0 (GLOVE) IMPLANT
GLOVE BIOGEL PI INDICATOR 7.0 (GLOVE) ×2
GLOVE ECLIPSE 6.5 STRL STRAW (GLOVE) ×3 IMPLANT
GLOVE ECLIPSE 7.5 STRL STRAW (GLOVE) ×3 IMPLANT
GLOVE SURG SS PI 7.0 STRL IVOR (GLOVE) ×2 IMPLANT
GOWN STRL REUS W/ TWL LRG LVL3 (GOWN DISPOSABLE) ×2 IMPLANT
GOWN STRL REUS W/TWL LRG LVL3 (GOWN DISPOSABLE) ×6
IMPLANT COCHLEAR ULTRA (Prosthesis and Implant ENT) ×2 IMPLANT
KIT BASIN OR (CUSTOM PROCEDURE TRAY) ×3 IMPLANT
KIT ROOM TURNOVER OR (KITS) ×3 IMPLANT
NDL HYPO 25GX1X1/2 BEV (NEEDLE) ×1 IMPLANT
NEEDLE HYPO 25GX1X1/2 BEV (NEEDLE) ×3 IMPLANT
NS IRRIG 1000ML POUR BTL (IV SOLUTION) ×3 IMPLANT
PAD ARMBOARD 7.5X6 YLW CONV (MISCELLANEOUS) ×6 IMPLANT
PENCIL BUTTON HOLSTER BLD 10FT (ELECTRODE) ×3 IMPLANT
PROBE NERVBE PRASS .33 (MISCELLANEOUS) IMPLANT
SOL PREP POV-IOD 4OZ 10% (MISCELLANEOUS) ×2 IMPLANT
SPONGE SURGIFOAM ABS GEL 12-7 (HEMOSTASIS) ×5 IMPLANT
SUT BONE WAX W31G (SUTURE) ×2 IMPLANT
SUT PLAIN 5 0 P 3 18 (SUTURE) ×3 IMPLANT
SUT SILK 2 0 FS (SUTURE) ×3 IMPLANT
SUT VIC AB 3-0 FS2 27 (SUTURE) ×3 IMPLANT
SUT VICRYL 4-0 PS2 18IN ABS (SUTURE) ×2 IMPLANT
TOWEL OR 17X24 6PK STRL BLUE (TOWEL DISPOSABLE) ×3 IMPLANT
TRAY ENT MC OR (CUSTOM PROCEDURE TRAY) ×3 IMPLANT
TUBING EXTENTION W/L.L. (IV SETS) ×3 IMPLANT
TUBING IRRIGATION (MISCELLANEOUS) ×3 IMPLANT
WATER STERILE IRR 1000ML POUR (IV SOLUTION) ×6 IMPLANT
WIPE INSTRUMENT VISIWIPE 73X73 (MISCELLANEOUS) ×3 IMPLANT

## 2015-10-27 NOTE — Transfer of Care (Signed)
Immediate Anesthesia Transfer of Care Note  Patient: [name]  Procedure(s) Performed: Procedure(s): LEFT COCHLEAR IMPLANT (Left)  Patient Location: PACU  Anesthesia Type:General  Level of Consciousness: sedated  Airway & Oxygen Therapy: Patient Spontanous Breathing and Patient connected to face mask oxygen  Post-op Assessment: Report given to RN, Post -op Vital signs reviewed and stable and Patient moving all extremities  Post vital signs: Reviewed and stable  Last Vitals:  Filed Vitals:   10/27/15 0657  BP: 146/75  Pulse: 77  Temp: 36.9 C  Resp: 16    Last Pain: There were no vitals filed for this visit.    Patients Stated Pain Goal: 2 ([id number])  Complications: No apparent anesthesia complications

## 2015-10-27 NOTE — Discharge Instructions (Addendum)
POSTOPERATIVE INSTRUCTIONS FOR PATIENTS HAVING MASTOIDECTOMY SURGERY  1. You may have nausea, vomiting, or a low grade fever for a few days after surgery. This is not unusual.  However, if the nausea and vomiting become severe or last more than one day, please call our office. Medication for nausea may be prescribed. You may take Tylenol every four hours for fever. If your fever should rise above 101 F, please contact our office. 2. Limit your activities for one week. This includes avoiding heavy lifting (over 20lbs), vigorous exercise, and contact sports. 3. Do not blow your nose for approximately one week.  Any accumulation in the nose should be drawn back into the throat and expectorated through the mouth to avoid infecting the ear. If it is necessary to sneeze, do so with your mouth open to decrease pressure to your ears. Do not hold your nose to avoid sneezing.  4. You may wash your hair 2 days after the operation.  5. Try to keep the incision clean and dry. You should clean crust from the incisional area with diluted hydrogen peroxide. Any time you are going to clean your ear, please wash your hands thoroughly prior to starting. 6. Some dull postoperative ear pain is expected. Your physician may prescribe pain medicine to help relieve your discomfort. If your postoperative pain increases and your medication is not helping, please call the office before taking any other medication that we have not prescribed or recommended. 7. If any of the following should occur, contact Dr.Zykeem Bauserman:   (Office: [phone])) a. Persistent bleeding                                                             b. Persistent fever c. Purulent drainage (pus) from the ear or incision d. Increasing redness around the suture line e. Persistent pain or dizziness f. Facial weakness 8. Sometimes, with a larger incision behind the ear, the incision may open and drain. If it occurs, please contact our office. 9. If your  physician prescribes an antibiotic, fill the prescription promptly and take all of the medicine as directed until the entire supply is gone. 10.  You may experience some popping and cracking sounds in the ear for up to several weeks. It may sound like you are talking in a barrel or a tunnel. This is normal and should not cause concern. 11. Because a nerve for taste passes through your ear, it is not unusual for your taste sensation to be altered for several weeks or months. 12. You may experience some numbness in your outer ear, earlobe, and the incision area. This is normal, and most of the numbness will be expected to fade over a period of time.                                                               13. Your eardrum may look pink or red for up to a month postoperatively. The red coloration is due to fluid in the middle ear. The change in color should not be confused with infection.  14.  It is important to return to our office for your postoperative appointment as scheduled. If for some reason you were not given a postoperative appointment, please call our office at [phone].  -----------------------------------------  Patient may restart her coumadin on Sunday 10/31/2015.  Place cotton ball in left ear canal if bloody drainage is noted.

## 2015-10-27 NOTE — Anesthesia Postprocedure Evaluation (Signed)
Anesthesia Post Note  Patient: TELESHA JULSON  Procedure(s) Performed: Procedure(s) (LRB): LEFT COCHLEAR IMPLANT (Left)  Patient location during evaluation: PACU Anesthesia Type: General Level of consciousness: awake Pain management: pain level controlled Vital Signs Assessment: post-procedure vital signs reviewed and stable Respiratory status: spontaneous breathing Cardiovascular status: stable Anesthetic complications: no    Last Vitals:  Filed Vitals:   10/27/15 1100 10/27/15 1115  BP: 120/49 120/58  Pulse: 86 83  Temp:    Resp: 18 17    Last Pain: There were no vitals filed for this visit.               EDWARDS,Tanasha Menees

## 2015-10-27 NOTE — H&P (Signed)
Cc: Bilateral severe-to-profound hearing loss  HPI: The patient is an 82 year old female who presents today for follow-up evaluation of her bilateral severe to profound hearing loss.  The patient presents today reporting significant improvement in her pulmonary condition.  Her pulmonologist has cleared her for the ear surgery. The patient continues to have significant difficulty with her hearing.  She is not receiving much benefit from her hearing aid.  She has not experienced any drainage from her ear recently. No other ENT, GI, or respiratory issue noted since the last visit.   Exam General: Communicates without difficulty, well nourished, no acute distress.   Head: Normocephalic, no evidence injury, no tenderness, facial buttresses intact without stepoff.   Eyes: PERRL, EOMI.   No scleral icterus, conjunctivae clear.   Neuro: CN II exam reveals vision grossly intact.   No nystagmus at any point of gaze.   EAC: Normal ear canals. The left tympanic membrane perforation has healed.  Both TMs are retracted and opacified.  Nose: External evaluation reveals normal support and skin without lesions.   Dorsum is intact.   Anterior rhinoscopy reveals congested and edematous mucosa over anterior aspect of the inferior turbinates and nasal septum.   No purulence is noted.   Middle meatus is not well visualized.   Oral:  Oral cavity and oropharynx are intact, symmetric, without erythema or edema.   Mucosa is moist without lesions.   Neck: Full range of motion without pain.   There is no significant lymphadenopathy.   No masses palpable.   Thyroid bed within normal limits to palpation.   Parotid glands and submandibular glands equal bilaterally without mass.   Trachea is midline.   Neuro:  CN 2-12 grossly intact.   Gait normal.  Assessment 1.  History of bilateral severe to profound hearing loss.  The patient is a candidate for cochlear implantation.   Plan  1.  The physical exam findings are reviewed with the  patient.   2.  In light of her improved pulmonary status, we will proceed with left cochlear implantation procedure.

## 2015-10-27 NOTE — Op Note (Signed)
DATE OF PROCEDURE:  10/27/2015                              OPERATIVE REPORT  SURGEON:  Leta Baptist, MD  PREOPERATIVE DIAGNOSES: 1. Bilateral severe to profound hearing loss  POSTOPERATIVE DIAGNOSES: 1. Bilateral severe to profound hearing loss  PROCEDURE PERFORMED:  Left ear cochlear implantation  ANESTHESIA:  General endotracheal tube anesthesia.  COMPLICATIONS:  None.  ESTIMATED BLOOD LOSS:  Less than 20 mL.  INDICATION FOR PROCEDURE:  [name] is a 82 y.o. female with a history of bilateral severe to profound sensorineural hearing loss. The patient was previously fitted with bilateral hearing aids. However due to the severity of her hearing loss, she continues to have significant difficulty with her hearing. Based on the above findings, the decision was made for patient to undergo the cochlear implantation procedure. The risks, benefits, alternatives, and details of the procedure were discussed with the patient.  Questions were invited and answered.  Informed consent was obtained.  DESCRIPTION:  The patient was taken to the operating room and placed supine on the operating table.  General endotracheal tube anesthesia was administered by the anesthesiologist.  The patient was positioned and prepped and draped in a standard fashion for left ear surgery. Facial nerve monitoring electrodes were placed. The facial nerve monitoring system was functional throughout the case.  1% lidocaine with 1-100,000 epinephrine was infiltrated into the left postauricular crease. A standard postauricular incision was made. The soft tissue overlying the left mastoid cortex was elevated. The patient previously underwent left tympanomastoidectomy surgery to treat her chronic ear infections. The mastoid cavity was carefully enlarged. The facial recess was then entered. The facial recess was also carefully enlarged. A 1 mm cochleostomy opening was then made.  Attention was then focused on drilling the bone bed  to house the cochlear implant processor. The bone bed was drilled to the depth of the recess gauge. The Advanced Bionic HiRes Ultra HiFocus implant was then placed in the bone bed. The electrodes were fully inserted into the cochleostomy opening. The incision was closed in layers with 4-0 Vicryl and Dermabond.  The care of the patient was turned over to the anesthesiologist.  The patient was awakened from anesthesia without difficulty.  She was extubated and transferred to the recovery room in good condition.  OPERATIVE FINDINGS:  An Advanced Bionic HiRes Ultra HiFocus implant was placed without difficulty. Full insertion was achieved.  SPECIMEN:  None.  FOLLOWUP CARE:  The patient will be observed overnight in the hospital. She will most likely be discharged home tomorrow.  Ascencion Dike 10/27/2015 10:43 AM

## 2015-10-27 NOTE — Anesthesia Procedure Notes (Signed)
Procedure Name: Intubation Date/Time: 10/27/2015 8:35 AM Performed by: Kyung Rudd Pre-anesthesia Checklist: Patient identified, Emergency Drugs available, Suction available, Patient being monitored and Timeout performed Patient Re-evaluated:Patient Re-evaluated prior to inductionOxygen Delivery Method: Circle system utilized Preoxygenation: Pre-oxygenation with 100% oxygen Intubation Type: IV induction Laryngoscope Size: Glidescope Grade View: Grade I Tube type: Oral Tube size: 7.0 mm Number of attempts: 1 Airway Equipment and Method: Stylet and Video-laryngoscopy Placement Confirmation: ETT inserted through vocal cords under direct vision,  positive ETCO2 and breath sounds checked- equal and bilateral Secured at: 21 cm Tube secured with: Tape Dental Injury: Teeth and Oropharynx as per pre-operative assessment  Comments: Electively used Glidescope due to previous note of difficult intubation.

## 2015-10-27 NOTE — Anesthesia Preprocedure Evaluation (Addendum)
Anesthesia Evaluation  Patient identified by MRN, date of birth, ID band Patient awake    Reviewed: Allergy & Precautions, NPO status , Patient's Chart, lab work & pertinent test results  History of Anesthesia Complications (+) DIFFICULT AIRWAY  Airway Mallampati: III  TM Distance: >3 FB Neck ROM: Full    Dental  (+) Teeth Intact   Pulmonary shortness of breath, asthma , pneumonia, COPD, former smoker,    breath sounds clear to auscultation       Cardiovascular  Rhythm:Regular Rate:Normal     Neuro/Psych    GI/Hepatic Neg liver ROS, GERD  ,  Endo/Other    Renal/GU negative Renal ROS     Musculoskeletal   Abdominal   Peds  Hematology   Anesthesia Other Findings   Reproductive/Obstetrics                            Anesthesia Physical Anesthesia Plan  ASA: III  Anesthesia Plan: General   Post-op Pain Management:    Induction: Intravenous  Airway Management Planned: Oral ETT  Additional Equipment:   Intra-op Plan:   Post-operative Plan: Extubation in OR  Informed Consent: I have reviewed the patients History and Physical, chart, labs and discussed the procedure including the risks, benefits and alternatives for the proposed anesthesia with the patient or authorized representative who has indicated his/her understanding and acceptance.   Dental advisory given  Plan Discussed with: Anesthesiologist and CRNA  Anesthesia Plan Comments:        Anesthesia Quick Evaluation

## 2015-10-28 ENCOUNTER — Encounter (HOSPITAL_COMMUNITY): Payer: Self-pay | Admitting: Otolaryngology

## 2015-10-28 DIAGNOSIS — H903 Sensorineural hearing loss, bilateral: Secondary | ICD-10-CM | POA: Diagnosis not present

## 2015-10-28 NOTE — Discharge Summary (Signed)
Physician Discharge Summary  Patient ID: [name] MRN: [id number] DOB/AGE: July 15, 1932 82 y.o.  Admit date: 10/27/2015 Discharge date: 10/28/2015  Admission Diagnoses: Bilateral severe to profound hearing loss  Discharge Diagnoses: Bilateral severe to profound hearing loss Active Problems:   Cochlear implant status   Discharged Condition: good  Hospital Course: Pt had an uneventful overnight stay. Facial nerve intact. Incision c/d/i.  Consults: None  Significant Diagnostic Studies: none  Treatments: surgery: Left cochlear implantation.  Discharge Exam: Blood pressure 134/69, pulse 112, temperature 98.4 F (36.9 C), temperature source Oral, resp. rate 16, height 5\' 1"  (1.549 m), weight 58.968 kg (130 lb), SpO2 99 %. Incision c/d/i Facial nerve function intact.  Disposition: 01-Home or Self Care  Discharge Instructions    Activity as tolerated - No restrictions    Complete by:  As directed      Diet general    Complete by:  As directed             Medication List    STOP taking these medications        warfarin 5 MG tablet  Commonly known as:  COUMADIN      TAKE these medications        acetaminophen 650 MG CR tablet  Commonly known as:  TYLENOL  Take 650 mg by mouth 2 (two) times daily. Every 6 hours as needed for pain     b complex vitamins capsule  Take 1 capsule by mouth daily.     calcium-vitamin D 500-200 MG-UNIT tablet  Commonly known as:  OSCAL WITH D  Take 1 tablet by mouth daily.     cholecalciferol 1000 units tablet  Commonly known as:  VITAMIN D  One daily for vitamin D supplement     ferrous sulfate 325 (65 FE) MG tablet  Take 325 mg by mouth 2 (two) times daily with a meal.     fluticasone 50 MCG/ACT nasal spray  Commonly known as:  FLONASE  Place 1 spray into both nostrils every evening.     Fluticasone-Salmeterol 500-50 MCG/DOSE Aepb  Commonly known as:  ADVAIR DISKUS  INHALE 1 PUFF EVERY 12 HOURS.     gabapentin 300 MG  capsule  Commonly known as:  NEURONTIN  Take 300 mg by mouth 3 (three) times daily.     guaiFENesin 600 MG 12 hr tablet  Commonly known as:  MUCINEX  Take 600 mg by mouth 2 (two) times daily.     latanoprost 0.005 % ophthalmic solution  Commonly known as:  XALATAN  Place 1 drop into both eyes at bedtime.     levalbuterol 0.63 MG/3ML nebulizer solution  Commonly known as:  XOPENEX  Take 0.63 mg by nebulization every 6 (six) hours as needed for wheezing or shortness of breath.     multivitamin with minerals tablet  Take 1 tablet by mouth daily.     omeprazole 20 MG capsule  Commonly known as:  PRILOSEC  Take 20 mg by mouth daily.     saccharomyces boulardii 250 MG capsule  Commonly known as:  FLORASTOR  Take 250 mg by mouth 2 (two) times daily.     sulfamethoxazole-trimethoprim 800-160 MG tablet  Commonly known as:  BACTRIM DS,SEPTRA DS  Take 1 tablet by mouth every Monday, Wednesday, and Friday. HOLD WHILE ON AUGMENTIN, THEN RESUME AS PREVIOUS     timolol 0.5 % ophthalmic solution  Commonly known as:  BETIMOL  Place 1 drop into the left eye daily with  breakfast.           Follow-up Information    Follow up with Ascencion Dike, MD On 11/03/2015.   Specialty:  Otolaryngology   Why:  at 1:10pm   Contact information:   [address]  [place] [address] [phone]       Signed: Ascencion Dike 10/28/2015, 8:16 AM

## 2015-10-28 NOTE — Progress Notes (Signed)
Pt DCd via volunteers and WC at 11am. Friend from her ALF picked her up. IV DCd, Pt very very HOH. Verbalized understanding of DC instructions - understood her meds and her follow up appt. Pt states staff at ALF helps her with meds and other needs.

## 2015-11-04 ENCOUNTER — Non-Acute Institutional Stay: Payer: Medicare Other | Admitting: Nurse Practitioner

## 2015-11-04 ENCOUNTER — Encounter: Payer: Self-pay | Admitting: Nurse Practitioner

## 2015-11-04 DIAGNOSIS — K5900 Constipation, unspecified: Secondary | ICD-10-CM | POA: Diagnosis not present

## 2015-11-04 DIAGNOSIS — R269 Unspecified abnormalities of gait and mobility: Secondary | ICD-10-CM | POA: Diagnosis not present

## 2015-11-04 DIAGNOSIS — F411 Generalized anxiety disorder: Secondary | ICD-10-CM

## 2015-11-04 DIAGNOSIS — Z86711 Personal history of pulmonary embolism: Secondary | ICD-10-CM | POA: Diagnosis not present

## 2015-11-04 DIAGNOSIS — D638 Anemia in other chronic diseases classified elsewhere: Secondary | ICD-10-CM

## 2015-11-04 DIAGNOSIS — J4520 Mild intermittent asthma, uncomplicated: Secondary | ICD-10-CM

## 2015-11-04 DIAGNOSIS — K219 Gastro-esophageal reflux disease without esophagitis: Secondary | ICD-10-CM | POA: Diagnosis not present

## 2015-11-04 DIAGNOSIS — M3130 Wegener's granulomatosis without renal involvement: Secondary | ICD-10-CM

## 2015-11-04 DIAGNOSIS — D62 Acute posthemorrhagic anemia: Secondary | ICD-10-CM

## 2015-11-04 DIAGNOSIS — G5793 Unspecified mononeuropathy of bilateral lower limbs: Secondary | ICD-10-CM

## 2015-11-04 NOTE — Assessment & Plan Note (Signed)
Motorized w/c to go further due to left shoulder reduced ROM and pain.

## 2015-11-04 NOTE — Assessment & Plan Note (Signed)
09/03/15 CXR no acute pathology is identified.  Chronic cough

## 2015-11-04 NOTE — Assessment & Plan Note (Signed)
Felt to have asthma, and airflow limitation from bronchial stenosis associated with Wegener's (s/p balloon dilatation). Continue Advair, Mucinex, Levalbuterol Neb 

## 2015-11-04 NOTE — Assessment & Plan Note (Signed)
Stable, continue Miralax.  

## 2015-11-04 NOTE — Assessment & Plan Note (Signed)
Prn Lorazepam is adequate for her  

## 2015-11-04 NOTE — Assessment & Plan Note (Signed)
Coumadin resumed after acute anemia is resolved, currently Hgb 11s 10/2015, Hx of PE

## 2015-11-04 NOTE — Assessment & Plan Note (Signed)
Pain is managed with Gabapentin 300mg  bid. Lower extremities weakness. W/c for mobility

## 2015-11-04 NOTE — Progress Notes (Signed)
Patient ID: [name], female   DOB: 06/24/1933, 82 y.o.   MRN: [id number]  Location:  [facility] Room Number: [id number] Place of Service: [facility] Provider:  Lennie Odor Victorina Kable NP  GREEN, Viviann Spare, MD  Patient Care Team: Estill Dooms, MD as PCP - General (Internal Medicine) Unice Bailey, MD as Consulting Physician (Rheumatology) Rozetta Nunnery, MD as Consulting Physician (Otolaryngology) Marcial Pacas, MD as Consulting Physician (Neurology) Michel Bickers, MD as Consulting Physician (Infectious Diseases) Volanda Napoleon, MD as Consulting Physician (Oncology) Juanito Doom, MD as Consulting Physician (Pulmonary Disease) Tanda Rockers, MD as Consulting Physician (Pulmonary Disease) Estill Dooms, MD as Consulting Physician (Geriatric Medicine) Coralie Keens, MD as Consulting Physician (General Surgery) Elisa Sorlie X, NP as Nurse Practitioner (Nurse Practitioner)  Extended Emergency Contact Information Primary Emergency Contact: Haygood,Sandra Address: [address] [place] [address] [place] of [place] Phone: [phone] Relation: Daughter Secondary Emergency Contact: Kapur,[name] Address: [address] [place]          [place], [place] [address] [place] Phone: [phone] Mobile Phone: [phone] Relation: Son  Code Status:  DNR Goals of care: Advanced Directive information Advanced Directives 11/04/2015  Does patient have an advance directive? Yes  Type of Paramedic of [place];Living will  Does patient want to make changes to advanced directive? No - Patient declined  Copy of advanced directive(s) in chart? Yes  Would patient like information on creating an advanced directive? -     Chief Complaint  Patient presents with  . Medical Management of Chronic Issues    Routine visit    HPI:  Pt is a 82 y.o. female seen today for medical management of chronic diseases.  Anemia, Hgb improved to 11.1 09/15/15, taking Fe, workup and tx under Hematology. Peripheral neuropathy, w/c for mobility, takes Gabapentin 300mg  tid. GERD symptomatic free on Omeprazole 20mg  daily.    Past Medical History  Diagnosis Date  . Wegener's granulomatosis ([place])   . OA (osteoarthritis)   . Blindness of one eye   . Hearing loss in left ear     hearing aid both ears  . Bladder incontinence   . Glaucoma   . Clotting disorder ([facility])   . History of pulmonary embolism 1997  . Pneumonia 12/15  . Multiple allergies   . Chronic sinusitis   . Acute respiratory failure with hypoxia ([place])   . Interstitial emphysema ([place])   . Aspergillosis ([place])   . Stroke [facility])     ??? blind left eye  . Cancer ([facility])     melanoma   ([place]mo)  . Difficult intubation     Difficult airway with intubation for ARF 07/22/14 (due to anterior larynx, also had mucous plug)  . Anemia   . Neuropathy ([place])   . Shortness of breath dyspnea    Past Surgical History  Procedure Laterality Date  . Tubal ligation    . Pubovaginal sling    . Cataract extraction    . Melanoma excision      left leg  . Vena cava filter placement  1997    [place] filter  . Knee arthroscopy  2006    rt   . Inguinal hernia repair Left 02/17/2014    Procedure: LEFT INGUINAL HERNIA REPAIR WITH MESH;  Surgeon: Harl Bowie, MD;  Location: [facility];  Service: General;  Laterality: Left;  . Insertion of mesh N/A 02/17/2014    Procedure: INSERTION OF MESH;  Surgeon: Harl Bowie, MD;  Location: [facility];  Service: General;  Laterality: N/A;  . Tear duct probing    . Tympanomastoidectomy Left 04/28/2015  . Tympanomastoidectomy Left 04/28/2015    Procedure: LEFT TYMPANOMASTOIDECTOMY;  Surgeon: Leta Baptist, MD;  [facility];  Service: ENT;  Laterality: Left;  . Bronchosocpy      pt. states she has had over 10 in last 20 yrs.  . Cochlear implant Left 10/27/2015    Procedure: LEFT COCHLEAR  IMPLANT;  Surgeon: Leta Baptist, MD;  Location: [facility];  Service: ENT;  Laterality: Left;    Allergies  Allergen Reactions  . Adhesive [Tape] Rash    On on lower extremities       Medication List       This list is accurate as of: 11/04/15  1:36 PM.  Always use your most recent med list.               acetaminophen 650 MG CR tablet  Commonly known as:  TYLENOL  Take 650 mg by mouth 2 (two) times daily. Every 6 hours as needed for pain     b complex vitamins capsule  Take 1 capsule by mouth daily.     calcium-vitamin D 500-200 MG-UNIT tablet  Commonly known as:  OSCAL WITH D  Take 1 tablet by mouth daily.     cholecalciferol 1000 units tablet  Commonly known as:  VITAMIN D  One daily for vitamin D supplement     ferrous sulfate 325 (65 FE) MG tablet  Take 325 mg by mouth 2 (two) times daily with a meal.     fluticasone 50 MCG/ACT nasal spray  Commonly known as:  FLONASE  Place 1 spray into both nostrils every evening.     Fluticasone-Salmeterol 500-50 MCG/DOSE Aepb  Commonly known as:  ADVAIR DISKUS  INHALE 1 PUFF EVERY 12 HOURS.     gabapentin 300 MG capsule  Commonly known as:  NEURONTIN  Take 300 mg by mouth 3 (three) times daily.     guaiFENesin 600 MG 12 hr tablet  Commonly known as:  MUCINEX  Take 600 mg by mouth 2 (two) times daily.     latanoprost 0.005 % ophthalmic solution  Commonly known as:  XALATAN  Place 1 drop into both eyes at bedtime.     levalbuterol 0.63 MG/3ML nebulizer solution  Commonly known as:  XOPENEX  Take 0.63 mg by nebulization every 6 (six) hours as needed for wheezing or shortness of breath.     multivitamin with minerals tablet  Take 1 tablet by mouth daily.     omeprazole 20 MG capsule  Commonly known as:  PRILOSEC  Take 20 mg by mouth daily.     saccharomyces boulardii 250 MG capsule  Commonly known as:  FLORASTOR  Take 250 mg by mouth 2 (two) times daily.     SORE THROAT SPRAY 1.4 % Liqd  Generic drug:  phenol  Use as  directed 1 spray in the mouth or throat as needed for throat irritation / pain.     sulfamethoxazole-trimethoprim 800-160 MG tablet  Commonly known as:  BACTRIM DS,SEPTRA DS  Take 1 tablet by mouth every Monday, Wednesday, and Friday. HOLD WHILE ON AUGMENTIN, THEN RESUME AS PREVIOUS     timolol 0.5 % ophthalmic solution  Commonly known as:  BETIMOL  Place 1 drop  into the left eye daily with breakfast.     warfarin 7.5 MG tablet  Commonly known as:  COUMADIN  Take 7.5 mg by mouth daily.        Review of Systems  Constitutional: Negative for fever, chills and diaphoresis.       Frail and generally weak.  HENT: Positive for hearing loss (Deaf on left, hearing aid on right ) and nosebleeds. Negative for congestion, ear discharge, ear pain and tinnitus.        Left cochlear implant pending.  Eyes: Negative.  Negative for pain and redness.  Respiratory: Positive for cough and shortness of breath. Negative for wheezing (Intermittent).        History Wegener's granulomatosis. History of chronic sinusitis related to her Wegener's. Chronic cough and yellowish phlegm production   Cardiovascular: Positive for leg swelling. Negative for chest pain and palpitations.       Trace R+L foot/ankle, R>L  Gastrointestinal: Negative for nausea, abdominal pain, diarrhea and constipation.       History of GERD.  Endocrine: Negative for polydipsia.  Genitourinary: Positive for frequency. Negative for dysuria, urgency, hematuria and flank pain.       Frequency induced by furosemide. Episodes of incontinence of urine.  Musculoskeletal: Positive for arthralgias and gait problem. Negative for myalgias, back pain and neck pain.       Generalized weakness Left upper extremity pain, limited ROM of the left shoulder  Skin: Negative.  Negative for rash.  Neurological: Positive for weakness. Negative for dizziness, tremors, seizures and headaches.       Neuropathic leg/foot pain. Generalized weakness BLE    Hematological: Does not bruise/bleed easily.       Chronic anemia.  Psychiatric/Behavioral: Negative for suicidal ideas and hallucinations. The patient is nervous/anxious.        Recently her husband has been admitted to SNF    Immunization History  Administered Date(s) Administered  . Influenza Split 03/04/2012, 03/11/2014, 04/01/2015  . Influenza,inj,Quad PF,36+ Mos 03/03/2013  . Influenza-Unspecified 04/01/2015  . PPD Test 05/25/2015  . Pneumococcal Conjugate-13 07/03/2009  . Pneumococcal Polysaccharide-23 08/07/2014  . Zoster 09/30/1994   Pertinent  Health Maintenance Due  Topic Date Due  . INFLUENZA VACCINE  02/01/2016  . DEXA SCAN  Completed  . PNA vac Low Risk Adult  Completed   Fall Risk  09/23/2015 09/09/2015 02/03/2015 12/04/2014 11/26/2014  Falls in the past year? Yes Yes Yes Yes Yes  Number falls in past yr: 2 or more 2 or more 2 or more 2 or more 2 or more  Injury with Fall? No No - No No   Functional Status Survey:    Filed Vitals:   11/04/15 1051  BP: 130/70  Pulse: 80  Temp: 97.7 F (36.5 C)  TempSrc: Oral  Resp: 22  Height: 5\' 1"  (1.549 m)  Weight: 130 lb (58.968 kg)   Body mass index is 24.58 kg/(m^2). Physical Exam  Constitutional: She is oriented to person, place, and time. She appears well-developed. No distress.  Thin and frail. Weight stable  since 3/16  HENT:  Head: Normocephalic and atraumatic.  Nose: Nose normal.  Mouth/Throat: Oropharynx is clear and moist. No oropharyngeal exudate.  Bilateral hearing loss. Hearing aid - right ear Pending left cochlear implant  Eyes: Conjunctivae and EOM are normal. Pupils are equal, round, and reactive to light. Right eye exhibits no discharge. Left eye exhibits no discharge. No scleral icterus.  Neck: Normal range of motion. Neck supple. No JVD present.  No tracheal deviation present. No thyromegaly present.  Cardiovascular: Normal rate, regular rhythm, normal heart sounds and intact distal pulses.   No  murmur heard. Pulmonary/Chest: Effort normal. No stridor. No respiratory distress. She has no wheezes. She has rales. She exhibits no tenderness.  Abdominal: Soft. Bowel sounds are normal. She exhibits no distension and no mass. There is no tenderness. There is no rebound and no guarding.  Musculoskeletal: Normal range of motion. She exhibits edema (trace) and tenderness.  R+L foot/ankle, R>L, trace to 1+ Left upper extremity pain. L shoulder limited ROM  Lymphadenopathy:    She has no cervical adenopathy.  Neurological: She is alert and oriented to person, place, and time. She has normal reflexes. No cranial nerve deficit. She exhibits normal muscle tone. Coordination normal.  The patient has history of neuropathy of bilateral lower extremities which resulted in generalized weakness and pain of legs, she is only able to ambulates a few steps with walker in her room and depend on w/c to go further. In order to maintain her quality of life and mobility, she needs wheelchair to travel to dinning room for meals and attend activities on the [place] where she resides. Due to her left upper extremity pain related to an injury occurred 4-5 years, she is unable to propel manual wheelchair or steer a scooter in her living space to maintain her mobility. Mrs Sinden manages her own affairs and finances with minimal assistance. She has worked with therapy in the past and has been deemed to be safe to operate a power chair.    Skin: Skin is warm and dry. No rash noted. She is not diaphoretic. No erythema. No pallor.  Corns on the left foot at the tip of the third toe and at the fifth metacarpal phalangeal joint slightly lateral.   Psychiatric: She has a normal mood and affect. Her behavior is normal. Judgment and thought content normal.  Emotional when voice her worry not able to walk to SNF to visit her busband.     Labs reviewed:  Recent Labs  04/20/15 1409  08/12/15 09/15/15 09/15/15 1342    NA 140  < > 138 141 141  K 4.2  < > 4.2 4.1 4.1  CL 106  --   --   --  109  CO2 28  --   --   --  23  GLUCOSE 104*  --   --   --  97  BUN 12  < > 13 13 13   CREATININE 0.79  < > 0.8 0.9 0.87  CALCIUM 10.0  --   --   --  9.9  < > = values in this interval not displayed.  Recent Labs  05/13/15 07/25/15 08/12/15  AST 19 22 21   ALT 11 14 15   ALKPHOS 108 178* 102    Recent Labs  11/23/14 1126 02/03/15 1335  09/15/15 1342  09/23/15 0908 09/28/15 10/05/15 10/27/15 0711  WBC 5.6 5.1  < > 4.9  < > 3.6* 4.3 3.8 4.4  NEUTROABS 3.9 3.4  --   --   --  2.1  --   --   --   HGB 12.8 12.3  < > 8.6*  < > 9.9* 9.6* 11.1* 10.7*  HCT 38.5 37.0  < > 28.3*  < > 32.2* 31* 36 34.2*  MCV 96 96  < > 76.9*  --  80*  --   --  84.0  PLT 345 302  < >  422*  < > 449* 384 354 324  < > = values in this interval not displayed. Lab Results  Component Value Date   TSH 1.56 05/13/2015   Lab Results  Component Value Date   HGBA1C 5.8* 07/28/2014   No results found for: CHOL, HDL, LDLCALC, LDLDIRECT, TRIG, CHOLHDL  Significant Diagnostic Results in last 30 days:  Dg Skull 1-3 Views  10/27/2015  CLINICAL DATA:  Postop film for cochlear implant EXAM: SKULL - 1-3 VIEW COMPARISON:  None. FINDINGS: Electronic device projects posteriorly over the occipital region. An electronic lead extends anteriorly projecting over the uterus temporal bones. IMPRESSION: Postsurgical change as described. Electronically Signed   By: Skipper Cliche M.D.   On: 10/27/2015 10:35    Assessment/Plan  Abnormality of gait Motorized w/c to go further due to left shoulder reduced ROM and pain.   Acute blood loss anemia 10/05/15 Hgb 11.1 Continue FE   Anemia of chronic disease 10/05/15 Hgb 11.1    Constipation Stable, continue Miralax.    Generalized anxiety disorder Prn Lorazepam is adequate for her  GERD (gastroesophageal reflux disease) Stable, continue Omeprazole 20mg  daily   Intrinsic asthma Felt to have asthma,  and airflow limitation from bronchial stenosis associated with Wegener's (s/p balloon dilatation). Continue Advair, Mucinex, Levalbuterol Neb   Mononeuropathy of both lower extremities Pain is managed with Gabapentin 300mg  bid. Lower extremities weakness. W/c for mobility   Personal history of PE (pulmonary embolism) Coumadin resumed after acute anemia is resolved, currently Hgb 11s 10/2015, Hx of PE   WEGENERS GRANULOMATOSIS 09/03/15 CXR no acute pathology is identified.  Chronic cough    Family/ staff Communication: supervision for safety.   Labs/tests ordered: none

## 2015-11-04 NOTE — Assessment & Plan Note (Signed)
10/05/15 Hgb 11.1 Continue FE

## 2015-11-04 NOTE — Assessment & Plan Note (Signed)
Stable, continue Omeprazole 20mg daily.  

## 2015-11-04 NOTE — Assessment & Plan Note (Signed)
10/05/15 Hgb 11.1

## 2015-11-16 ENCOUNTER — Other Ambulatory Visit (HOSPITAL_COMMUNITY): Payer: Medicare Other

## 2015-11-23 ENCOUNTER — Other Ambulatory Visit: Payer: Medicare Other

## 2015-11-23 ENCOUNTER — Ambulatory Visit: Payer: Medicare Other | Admitting: Hematology & Oncology

## 2015-11-25 ENCOUNTER — Other Ambulatory Visit: Payer: Medicare Other

## 2015-11-25 ENCOUNTER — Ambulatory Visit: Payer: Medicare Other | Admitting: Family

## 2015-11-26 ENCOUNTER — Non-Acute Institutional Stay: Payer: Medicare Other | Admitting: Nurse Practitioner

## 2015-11-26 ENCOUNTER — Encounter: Payer: Self-pay | Admitting: Nurse Practitioner

## 2015-11-26 DIAGNOSIS — M3130 Wegener's granulomatosis without renal involvement: Secondary | ICD-10-CM | POA: Diagnosis not present

## 2015-11-26 DIAGNOSIS — Z7901 Long term (current) use of anticoagulants: Secondary | ICD-10-CM | POA: Diagnosis not present

## 2015-11-26 DIAGNOSIS — R509 Fever, unspecified: Secondary | ICD-10-CM

## 2015-11-26 DIAGNOSIS — K5900 Constipation, unspecified: Secondary | ICD-10-CM | POA: Diagnosis not present

## 2015-11-26 DIAGNOSIS — R609 Edema, unspecified: Secondary | ICD-10-CM

## 2015-11-26 DIAGNOSIS — K219 Gastro-esophageal reflux disease without esophagitis: Secondary | ICD-10-CM

## 2015-11-26 DIAGNOSIS — D62 Acute posthemorrhagic anemia: Secondary | ICD-10-CM

## 2015-11-26 DIAGNOSIS — J4520 Mild intermittent asthma, uncomplicated: Secondary | ICD-10-CM

## 2015-11-26 DIAGNOSIS — J329 Chronic sinusitis, unspecified: Secondary | ICD-10-CM | POA: Diagnosis not present

## 2015-11-26 DIAGNOSIS — F411 Generalized anxiety disorder: Secondary | ICD-10-CM | POA: Diagnosis not present

## 2015-11-26 DIAGNOSIS — D638 Anemia in other chronic diseases classified elsewhere: Secondary | ICD-10-CM | POA: Diagnosis not present

## 2015-11-26 LAB — BASIC METABOLIC PANEL
BUN: 13 mg/dL (ref 4–21)
Creatinine: 0.8 mg/dL (ref 0.5–1.1)
GLUCOSE: 102 mg/dL
Potassium: 4.1 mmol/L (ref 3.4–5.3)
Sodium: 137 mmol/L (ref 137–147)

## 2015-11-26 LAB — CBC AND DIFFERENTIAL
HCT: 38 % (ref 36–46)
HEMOGLOBIN: 12.3 g/dL (ref 12.0–16.0)
Platelets: 280 10*3/uL (ref 150–399)
WBC: 11.5 10*3/mL

## 2015-11-26 LAB — HEPATIC FUNCTION PANEL
ALK PHOS: 171 U/L — AB (ref 25–125)
ALT: 20 U/L (ref 7–35)
AST: 33 U/L (ref 13–35)
BILIRUBIN, TOTAL: 0.5 mg/dL

## 2015-11-26 NOTE — Progress Notes (Signed)
Patient ID: [name], female   DOB: 12-Jul-1932, 82 y.o.   MRN: [id number]  Location:  [place] Room Number: [id number] [place] of Service: [facility] Provider:  Lennie Odor Charliene Inoue NP  GREEN, Viviann Spare, MD  Patient Care Team: Estill Dooms, MD as PCP - General (Internal Medicine) Unice Bailey, MD as Consulting Physician (Rheumatology) Rozetta Nunnery, MD as Consulting Physician (Otolaryngology) Marcial Pacas, MD as Consulting Physician (Neurology) Michel Bickers, MD as Consulting Physician (Infectious Diseases) Volanda Napoleon, MD as Consulting Physician (Oncology) Juanito Doom, MD as Consulting Physician (Pulmonary Disease) Tanda Rockers, MD as Consulting Physician (Pulmonary Disease) Estill Dooms, MD as Consulting Physician (Geriatric Medicine) Coralie Keens, MD as Consulting Physician (General Surgery) Adalida Garver X, NP as Nurse Practitioner (Nurse Practitioner)  Extended Emergency Contact Information Primary Emergency Contact: Haygood,Sandra Address: [address] [place] [address] [place]te Litter of [place] Phone: [phone] Relation: Daughter Secondary Emergency Contact: Campanelli,[name] Address: [address] [place]          [place], [place] [address] [place] Phone: [phone] Mobile Phone: [phone] Relation: Son  Code Status:  DNR Goals of care: Advanced Directive information Advanced Directives 11/26/2015  Does patient have an advance directive? Yes  Type of Advance Directive [place]  Does patient want to make changes to advanced directive? No - Patient declined  Copy of advanced directive(s) in chart? Yes     Chief Complaint  Patient presents with  . Acute Visit    Elevated temp    HPI:  Pt is a 82 y.o. female seen today for medical management of chronic diseases. Anemia, Hgb improved to 11.1 09/15/15, taking Fe, workup and tx under Hematology. Peripheral neuropathy,  w/c for mobility, takes Gabapentin 300mg  tid. GERD symptomatic free on Omeprazole 20mg  daily.    Past Medical History  Diagnosis Date  . Wegener's granulomatosis ([place])   . OA (osteoarthritis)   . Blindness of one eye   . Hearing loss in left ear     hearing aid both ears  . Bladder incontinence   . Glaucoma   . Clotting disorder ([place])   . History of pulmonary embolism 1997  . Pneumonia 12/15  . Multiple allergies   . Chronic sinusitis   . Acute respiratory failure with hypoxia ([place])   . Interstitial emphysema ([place])   . Aspergillosis ([place])   . Stroke [facility])     ??? blind left eye  . Cancer ([place])     melanoma   (chemo)  . Difficult intubation     Difficult airway with intubation for ARF 07/22/14 (due to anterior larynx, also had mucous plug)  . Anemia   . Neuropathy ([place])   . Shortness of breath dyspnea    Past Surgical History  Procedure Laterality Date  . Tubal ligation    . Pubovaginal sling    . Cataract extraction    . Melanoma excision      left leg  . Vena cava filter placement  1997    [place] filter  . Knee arthroscopy  2006    rt   . Inguinal hernia repair Left 02/17/2014    Procedure: LEFT INGUINAL HERNIA REPAIR WITH MESH;  Surgeon: Harl Bowie, MD;  Location: [facility];  Service: General;  Laterality: Left;  . Insertion of mesh N/A 02/17/2014    Procedure: INSERTION  OF MESH;  Surgeon: Harl Bowie, MD;  Location: [facility];  Service: General;  Laterality: N/A;  . Tear duct probing    . Tympanomastoidectomy Left 04/28/2015  . Tympanomastoidectomy Left 04/28/2015    Procedure: LEFT TYMPANOMASTOIDECTOMY;  Surgeon: Leta Baptist, MD;  Location[facility];  Service: ENT;  Laterality: Left;  . Bronchosocpy      pt. states she has had over 10 in last 20 yrs.  . Cochlear implant Left 10/27/2015    Procedure: LEFT COCHLEAR IMPLANT;  Surgeon: Leta Baptist, MD;  Location: [facility];  Service: ENT;  Laterality: Left;    Allergies  Allergen  Reactions  . Adhesive [Tape] Rash    On on lower extremities       Medication List       This list is accurate as of: 11/26/15  3:24 PM.  Always use your most recent med list.               acetaminophen 650 MG CR tablet  Commonly known as:  TYLENOL  Take 650 mg by mouth 2 (two) times daily. Every 6 hours as needed for pain     b complex vitamins capsule  Take 1 capsule by mouth daily.     calcium-vitamin D 500-200 MG-UNIT tablet  Commonly known as:  OSCAL WITH D  Take 1 tablet by mouth daily.     cholecalciferol 1000 units tablet  Commonly known as:  VITAMIN D  One daily for vitamin D supplement     ferrous sulfate 325 (65 FE) MG tablet  Take 325 mg by mouth 2 (two) times daily with a meal.     fluticasone 50 MCG/ACT nasal spray  Commonly known as:  FLONASE  Place 1 spray into both nostrils every evening.     Fluticasone-Salmeterol 500-50 MCG/DOSE Aepb  Commonly known as:  ADVAIR DISKUS  INHALE 1 PUFF EVERY 12 HOURS.     gabapentin 300 MG capsule  Commonly known as:  NEURONTIN  Take 300 mg by mouth 3 (three) times daily.     guaiFENesin 600 MG 12 hr tablet  Commonly known as:  MUCINEX  Take 600 mg by mouth 2 (two) times daily.     latanoprost 0.005 % ophthalmic solution  Commonly known as:  XALATAN  Place 1 drop into both eyes at bedtime.     levalbuterol 0.63 MG/3ML nebulizer solution  Commonly known as:  XOPENEX  Take 0.63 mg by nebulization every 6 (six) hours as needed for wheezing or shortness of breath.     multivitamin with minerals tablet  Take 1 tablet by mouth daily.     omeprazole 20 MG capsule  Commonly known as:  PRILOSEC  Take 20 mg by mouth daily.     saccharomyces boulardii 250 MG capsule  Commonly known as:  FLORASTOR  Take 250 mg by mouth 2 (two) times daily.     SORE THROAT SPRAY 1.4 % Liqd  Generic drug:  phenol  Use as directed 1 spray in the mouth or throat as needed for throat irritation / pain.      sulfamethoxazole-trimethoprim 800-160 MG tablet  Commonly known as:  BACTRIM DS,SEPTRA DS  Take 1 tablet by mouth every Monday, Wednesday, and Friday. HOLD WHILE ON AUGMENTIN, THEN RESUME AS PREVIOUS     timolol 0.5 % ophthalmic solution  Commonly known as:  BETIMOL  Place 1 drop into the left eye daily with breakfast.     warfarin 7.5 MG  tablet  Commonly known as:  COUMADIN  Take 7.5 mg by mouth daily.        Review of Systems  Constitutional: Negative for fever, chills and diaphoresis.       Frail and generally weak.  HENT: Positive for hearing loss (Deaf on left, hearing aid on right ) and nosebleeds. Negative for congestion, ear discharge, ear pain and tinnitus.        Left cochlear implant pending.  Eyes: Negative.  Negative for pain and redness.  Respiratory: Positive for cough and shortness of breath. Negative for wheezing (Intermittent).        History Wegener's granulomatosis. History of chronic sinusitis related to her Wegener's. Chronic cough and yellowish phlegm production   Cardiovascular: Positive for leg swelling. Negative for chest pain and palpitations.       Trace R+L foot/ankle, R>L  Gastrointestinal: Negative for nausea, abdominal pain, diarrhea and constipation.       History of GERD.  Endocrine: Negative for polydipsia.  Genitourinary: Positive for frequency. Negative for dysuria, urgency, hematuria and flank pain.       Frequency induced by furosemide. Episodes of incontinence of urine.  Musculoskeletal: Positive for arthralgias and gait problem. Negative for myalgias, back pain and neck pain.       Generalized weakness Left upper extremity pain, limited ROM of the left shoulder  Skin: Negative.  Negative for rash.  Neurological: Positive for weakness. Negative for dizziness, tremors, seizures and headaches.       Neuropathic leg/foot pain. Generalized weakness BLE  Hematological: Does not bruise/bleed easily.       Chronic anemia.  Psychiatric/Behavioral:  Negative for suicidal ideas and hallucinations. The patient is nervous/anxious.        Recently her husband has been admitted to SNF    Immunization History  Administered Date(s) Administered  . Influenza Split 03/04/2012, 03/11/2014, 04/01/2015  . Influenza,inj,Quad PF,36+ Mos 03/03/2013  . Influenza-Unspecified 04/01/2015  . PPD Test 05/25/2015  . Pneumococcal Conjugate-13 07/03/2009  . Pneumococcal Polysaccharide-23 08/07/2014  . Zoster 09/30/1994   Pertinent  Health Maintenance Due  Topic Date Due  . INFLUENZA VACCINE  02/01/2016  . DEXA SCAN  Completed  . PNA vac Low Risk Adult  Completed   Fall Risk  09/23/2015 09/09/2015 02/03/2015 12/04/2014 11/26/2014  Falls in the past year? Yes Yes Yes Yes Yes  Number falls in past yr: 2 or more 2 or more 2 or more 2 or more 2 or more  Injury with Fall? No No - No No   Functional Status Survey:    Filed Vitals:   11/26/15 1408  BP: 140/78  Pulse: 120  Temp: 102 F (38.9 C)  TempSrc: Oral  Resp: 24  Height: 5\' 1"  (1.549 m)  Weight: 130 lb (58.968 kg)   Body mass index is 24.58 kg/(m^2). Physical Exam  Constitutional: She is oriented to person, place, and time. She appears well-developed. No distress.  Thin and frail. Weight stable  since 3/16  HENT:  Head: Normocephalic and atraumatic.  Nose: Nose normal.  Mouth/Throat: Oropharynx is clear and moist. No oropharyngeal exudate.  Bilateral hearing loss. Hearing aid - right ear Pending left cochlear implant  Eyes: Conjunctivae and EOM are normal. Pupils are equal, round, and reactive to light. Right eye exhibits no discharge. Left eye exhibits no discharge. No scleral icterus.  Neck: Normal range of motion. Neck supple. No JVD present. No tracheal deviation present. No thyromegaly present.  Cardiovascular: Normal rate, regular rhythm, normal heart  sounds and intact distal pulses.   No murmur heard. Pulmonary/Chest: Effort normal. No stridor. No respiratory distress. She has no  wheezes. She has rales. She exhibits no tenderness.  Abdominal: Soft. Bowel sounds are normal. She exhibits no distension and no mass. There is no tenderness. There is no rebound and no guarding.  Musculoskeletal: Normal range of motion. She exhibits edema (trace) and tenderness.  R+L foot/ankle, R>L, trace to 1+ Left upper extremity pain. L shoulder limited ROM  Lymphadenopathy:    She has no cervical adenopathy.  Neurological: She is alert and oriented to person, place, and time. She has normal reflexes. No cranial nerve deficit. She exhibits normal muscle tone. Coordination normal.  The patient has history of neuropathy of bilateral lower extremities which resulted in generalized weakness and pain of legs, she is only able to ambulates a few steps with walker in her room and depend on w/c to go further. In order to maintain her quality of life and mobility, she needs wheelchair to travel to dinning room for meals and attend activities on the [place] where she resides. Due to her left upper extremity pain related to an injury occurred 4-5 years, she is unable to propel manual wheelchair or steer a scooter in her living space to maintain her mobility. Mrs Ludolph manages her own affairs and finances with minimal assistance. She has worked with therapy in the past and has been deemed to be safe to operate a power chair.    Skin: Skin is warm and dry. No rash noted. She is not diaphoretic. No erythema. No pallor.  Corns on the left foot at the tip of the third toe and at the fifth metacarpal phalangeal joint slightly lateral.   Psychiatric: She has a normal mood and affect. Her behavior is normal. Judgment and thought content normal.  Emotional when voice her worry not able to walk to SNF to visit her busband.     Labs reviewed:  Recent Labs  04/20/15 1409  08/12/15 09/15/15 09/15/15 1342  NA 140  < > 138 141 141  K 4.2  < > 4.2 4.1 4.1  CL 106  --   --   --  109  CO2 28  --   --    --  23  GLUCOSE 104*  --   --   --  97  BUN 12  < > 13 13 13   CREATININE 0.79  < > 0.8 0.9 0.87  CALCIUM 10.0  --   --   --  9.9  < > = values in this interval not displayed.  Recent Labs  05/13/15 07/25/15 08/12/15  AST 19 22 21   ALT 11 14 15   ALKPHOS 108 178* 102    Recent Labs  02/03/15 1335  09/15/15 1342  09/23/15 0908 09/28/15 10/05/15 10/27/15 0711  WBC 5.1  < > 4.9  < > 3.6* 4.3 3.8 4.4  NEUTROABS 3.4  --   --   --  2.1  --   --   --   HGB 12.3  < > 8.6*  < > 9.9* 9.6* 11.1* 10.7*  HCT 37.0  < > 28.3*  < > 32.2* 31* 36 34.2*  MCV 96  < > 76.9*  --  80*  --   --  84.0  PLT 302  < > 422*  < > 449* 384 354 324  < > = values in this interval not displayed. Lab Results  Component Value Date  TSH 1.56 05/13/2015   Lab Results  Component Value Date   HGBA1C 5.8* 07/28/2014   No results found for: CHOL, HDL, LDLCALC, LDLDIRECT, TRIG, CHOLHDL  Significant Diagnostic Results in last 30 days:  No results found.  Assessment/Plan  Fever, unspecified Generalized weakness, fell w/o apparent injury, T 102.3, chronic cough, no O2 desaturation, no audible wheezes, denied chest pain or palpitation. Will obtain CXR, CBC, CMP, UA C/S. Empirical ABT Avelox 400mg  daily x 10days.   WEGENERS GRANULOMATOSIS 09/03/15 CXR no acute pathology is identified.  Chronic cough  Sinusitis, chronic  hydration and humidified air. Continue Advair, Xopenex, Flonase   Intrinsic asthma Felt to have asthma, and airflow limitation from bronchial stenosis associated with Wegener's (s/p balloon dilatation). Continue Advair, Mucinex, Xopenex Neb    Constipation Stable, continue Miralax.   GERD (gastroesophageal reflux disease) Stable, continue Omeprazole 20mg  daily    Anemia of chronic disease 10/05/15 Hgb 11.1  Edema Off diuretics. Only trace edema in ankles-no longer needing TED.    Long term current use of anticoagulant therapy Hgb 11s 10/2015, Hx of PE. PT/INR next  Tuesday  Generalized anxiety disorder Prn Lorazepam is adequate for her  Acute blood loss anemia 10/05/15 Hgb 11.1 Continue FE    Family/ staff Communication: supervision for safety.   Labs/tests ordered: CBC, CMP, UA C/S, CXR, PT/INR

## 2015-11-26 NOTE — Assessment & Plan Note (Signed)
Felt to have asthma, and airflow limitation from bronchial stenosis associated with Wegener's (s/p balloon dilatation). Continue Advair, Mucinex, Xopenex Neb

## 2015-11-26 NOTE — Assessment & Plan Note (Signed)
09/03/15 CXR no acute pathology is identified.  Chronic cough

## 2015-11-26 NOTE — Assessment & Plan Note (Signed)
Stable, continue Omeprazole 20mg daily.  

## 2015-11-26 NOTE — Assessment & Plan Note (Signed)
10/05/15 Hgb 11.1 Continue FE

## 2015-11-26 NOTE — Assessment & Plan Note (Signed)
Off diuretics. Only trace edema in ankles-no longer needing TED.  

## 2015-11-26 NOTE — Assessment & Plan Note (Signed)
Stable, continue Miralax.  

## 2015-11-26 NOTE — Assessment & Plan Note (Signed)
Hgb 11s 10/2015, Hx of PE. PT/INR next Tuesday

## 2015-11-26 NOTE — Assessment & Plan Note (Signed)
Prn Lorazepam is adequate for her

## 2015-11-26 NOTE — Assessment & Plan Note (Signed)
Generalized weakness, fell w/o apparent injury, T 102.3, chronic cough, no O2 desaturation, no audible wheezes, denied chest pain or palpitation. Will obtain CXR, CBC, CMP, UA C/S. Empirical ABT Avelox 400mg  daily x 10days.

## 2015-11-26 NOTE — Assessment & Plan Note (Signed)
10/05/15 Hgb 11.1

## 2015-11-26 NOTE — Assessment & Plan Note (Signed)
hydration and humidified air. Continue Advair, Xopenex, Flonase

## 2015-11-30 ENCOUNTER — Ambulatory Visit (INDEPENDENT_AMBULATORY_CARE_PROVIDER_SITE_OTHER): Payer: Medicare Other | Admitting: Pulmonary Disease

## 2015-11-30 ENCOUNTER — Encounter: Payer: Self-pay | Admitting: Pulmonary Disease

## 2015-11-30 VITALS — BP 126/70 | HR 90 | Ht 61.0 in | Wt 136.0 lb

## 2015-11-30 DIAGNOSIS — R918 Other nonspecific abnormal finding of lung field: Secondary | ICD-10-CM

## 2015-11-30 DIAGNOSIS — J4520 Mild intermittent asthma, uncomplicated: Secondary | ICD-10-CM | POA: Diagnosis not present

## 2015-11-30 DIAGNOSIS — M3130 Wegener's granulomatosis without renal involvement: Secondary | ICD-10-CM

## 2015-11-30 DIAGNOSIS — A319 Mycobacterial infection, unspecified: Secondary | ICD-10-CM | POA: Diagnosis not present

## 2015-11-30 LAB — PROTIME-INR: PROTIME: 23.6 s — AB (ref 10.0–13.8)

## 2015-11-30 LAB — POCT INR: INR: 2.1 — AB (ref <=1.1)

## 2015-11-30 NOTE — Progress Notes (Signed)
Subjective:    Patient ID: [name], female    DOB: 04/07/1933, 82 y.o.   MRN: [id number]  Synopsis: [name] has asthma with normal pulmonary function testing. She also has a history of Wegener's granulomatosis and has been treated with high-dose immunosuppressants for many years. She has significant sinus disease related to this and she has lower airway obstruction which has been treated with balloon dilation in the past.  HPI Chief Complaint  Patient presents with  . Follow-up    pt has no breathing complaints today.     [name] is struggling with her hearing.  She has been having a lot of trouble with her knees as well. She had a cochlear implant.  She has yet to fire up the cochlear implant due to something about the equipment.  She says that her breathing has been OK.  She coughs every day and it is better at night. She continues to bring up yellow mucus.  She said that she had some blood in it a few weeks back when her INR was running high.  This hasn't recurred since then.  She hsa not had to take any prednisone for a flare of her lung problems.  Past Medical History  Diagnosis Date  . Wegener's granulomatosis ([place])   . OA (osteoarthritis)   . Blindness of one eye   . Hearing loss in left ear     hearing aid both ears  . Bladder incontinence   . Glaucoma   . Clotting disorder ([place])   . History of pulmonary embolism 1997  . Pneumonia 12/15  . Multiple allergies   . Chronic sinusitis   . Acute respiratory failure with hypoxia ([place])   . Interstitial emphysema ([place])   . Aspergillosis ([place])   . Stroke [facility])     ??? blind left eye  . Cancer ([place])     melanoma   ([place]mo)  . Difficult intubation     Difficult airway with intubation for ARF 07/22/14 (due to anterior larynx, also had mucous plug)  . Anemia   . Neuropathy ([place])   . Shortness of breath dyspnea       Review of Systems  Constitutional: Positive for fatigue. Negative for fever.  HENT: Positive for  postnasal drip, rhinorrhea and sinus pressure.   Respiratory: Positive for cough. Negative for shortness of breath and wheezing.   Cardiovascular: Negative for chest pain, palpitations and leg swelling.       Objective:   Physical Exam  Filed Vitals:   11/30/15 1333  BP: 126/70  Pulse: 90  Height: 5\' 1"  (1.549 m)  Weight: 136 lb (61.689 kg)  SpO2: 98%   RA  Gen: well appearing, no acute distress HENT: NCAT, OP clear,  Eyes: anicteric, EOMi Lymph: no cervical lymphadenopathy PULM: few upper airway wheezes, normal effort CV: RRR, no mgr, no JVD GI: BS+, soft, nontender, no hsm Derm: no rash or skin breakdown MSK: normal bulk and tone Neuro: A&Ox4, CN II-XII intact, MAEW Psyche: normal mood and affect  January 2017 CT chest shows stable nodules bilaterally  CBC    Component Value Date/Time   WBC 4.4 10/27/2015 0711   WBC 3.8 10/05/2015   WBC 7.5 11/17/2014 1840   RBC 4.07 10/27/2015 0711   RBC 4.02 09/23/2015 0908   RBC 3.90 02/03/2015 1335   RBC 4.41 11/17/2014 1840   HGB 10.7* 10/27/2015 0711   HGB 9.9* 09/23/2015 0908   HGB 13.6 11/17/2014 1840   HCT  34.2* 10/27/2015 0711   HCT 32.2* 09/23/2015 0908   HCT 42.2 11/17/2014 1840   PLT 324 10/27/2015 0711   PLT 449* 09/23/2015 0908   MCV 84.0 10/27/2015 0711   MCV 80* 09/23/2015 0908   MCV 95.7 11/17/2014 1840   MCH 26.3 10/27/2015 0711   MCH 24.6* 09/23/2015 0908   MCH 30.9 11/17/2014 1840   MCHC 31.3 10/27/2015 0711   MCHC 30.7* 09/23/2015 0908   MCHC 32.2 11/17/2014 1840   RDW 24.3* 10/27/2015 0711   RDW 22.5* 09/23/2015 0908   LYMPHSABS 0.7* 09/23/2015 0908   LYMPHSABS 0.4* 07/24/2014 0420   MONOABS 0.3 07/24/2014 0420   EOSABS 0.1 09/23/2015 0908   EOSABS 0.0 07/24/2014 0420   BASOSABS 0.0 09/23/2015 0908   BASOSABS 0.0 07/24/2014 0420         Assessment & Plan:  Intrinsic asthma She has chronic wheezing on exam this is due to some degree of airway narrowing from her Wegener's. Overall she  is stable and has not had any exacerbations of her airways disease.  Plan: Continue Advair as prescribed  WEGENERS GRANULOMATOSIS There is no evidence of progression of her respiratory symptoms are related to her Wegener's.  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 At this time we are treating this as a colonizer and observing alone.  Plan: If she develops worsening shortness of breath, cough, weight loss, fevers, chills, or mucus production then we will re-sample her mucus and consider treating for Mycobacterium processes.  Pulmonary nodules Her CT scan in January 2017 showed no progression prior to previous scans. At this time I see no role for repeat scanning if she's had multiple CT scans in the past which shows stable nodules.     Current outpatient prescriptions:  .  acetaminophen (TYLENOL) 650 MG CR tablet, Take 650 mg by mouth 2 (two) times daily. Every 6 hours as needed for pain, Disp: , Rfl:  .  b complex vitamins capsule, Take 1 capsule by mouth daily., Disp: , Rfl:  .  calcium-vitamin D (OSCAL WITH D) 500-200 MG-UNIT per tablet, Take 1 tablet by mouth daily. , Disp: , Rfl:  .  cholecalciferol (VITAMIN D) 1000 UNITS tablet, One daily for vitamin D supplement, Disp: 30 tablet, Rfl: 11 .  ferrous sulfate 325 (65 FE) MG tablet, Take 325 mg by mouth 2 (two) times daily with a meal., Disp: , Rfl:  .  fluticasone (FLONASE) 50 MCG/ACT nasal spray, Place 1 spray into both nostrils every evening. , Disp: , Rfl: 5 .  Fluticasone-Salmeterol (ADVAIR DISKUS) 500-50 MCG/DOSE AEPB, INHALE 1 PUFF EVERY 12 HOURS., Disp: 180 each, Rfl: 1 .  gabapentin (NEURONTIN) 300 MG capsule, Take 300 mg by mouth 3 (three) times daily. , Disp: , Rfl:  .  guaiFENesin (MUCINEX) 600 MG 12 hr tablet, Take 600 mg by mouth 2 (two) times daily., Disp: , Rfl:  .  latanoprost  (XALATAN) 0.005 % ophthalmic solution, Place 1 drop into both eyes at bedtime., Disp: , Rfl:  .  levalbuterol (XOPENEX) 0.63 MG/3ML nebulizer solution, Take 0.63 mg by nebulization every 6 (six) hours as needed for wheezing or shortness of breath., Disp: , Rfl:  .  Multiple Vitamins-Minerals (MULTIVITAMIN WITH MINERALS) tablet, Take 1 tablet by mouth daily., Disp: , Rfl:  .  omeprazole (PRILOSEC) 20 MG capsule, Take 20 mg by mouth daily., Disp: , Rfl:  .  phenol (SORE THROAT SPRAY) 1.4 % LIQD, Use as directed 1 spray in the mouth or throat as needed for throat irritation / pain., Disp: , Rfl:  .  saccharomyces boulardii (FLORASTOR) 250 MG capsule, Take 250 mg by mouth 2 (two) times daily., Disp: , Rfl:  .  sulfamethoxazole-trimethoprim (BACTRIM DS,SEPTRA DS) 800-160 MG per tablet, Take 1 tablet by mouth every Monday, Wednesday, and Friday. HOLD WHILE ON AUGMENTIN, THEN RESUME AS PREVIOUS (Patient taking differently: Take 1 tablet by mouth every Monday, Wednesday, and Friday. ), Disp: , Rfl: 1 .  timolol (BETIMOL) 0.5 % ophthalmic solution, Place 1 drop into the left eye daily with breakfast. , Disp: , Rfl:  .  warfarin (COUMADIN) 7.5 MG tablet, Take 7.5 mg by mouth daily., Disp: , Rfl:

## 2015-11-30 NOTE — Patient Instructions (Signed)
Keep taking your Advair as you are doing We will see you back in 6 months or sooner if needed

## 2015-11-30 NOTE — Assessment & Plan Note (Signed)
Her CT scan in January 2017 showed no progression prior to previous scans. At this time I see no role for repeat scanning if she's had multiple CT scans in the past which shows stable nodules.

## 2015-11-30 NOTE — Assessment & Plan Note (Signed)
She grew out mycobacterium abscesses in 2016 but since then she's had no evidence of active disease. At this time we are treating this as a colonizer and observing alone.  Plan: If she develops worsening shortness of breath, cough, weight loss, fevers, chills, or mucus production then we will re-sample her mucus and consider treating for Mycobacterium processes.

## 2015-11-30 NOTE — Assessment & Plan Note (Signed)
There is no evidence of progression of her respiratory symptoms are related to her Wegener's.  Plan: Continue follow-up with rheumatology

## 2015-11-30 NOTE — Assessment & Plan Note (Signed)
She has chronic wheezing on exam this is due to some degree of airway narrowing from her Wegener's. Overall she is stable and has not had any exacerbations of her airways disease.  Plan: Continue Advair as prescribed

## 2015-12-02 ENCOUNTER — Other Ambulatory Visit (HOSPITAL_BASED_OUTPATIENT_CLINIC_OR_DEPARTMENT_OTHER): Payer: Medicare Other

## 2015-12-02 ENCOUNTER — Ambulatory Visit (HOSPITAL_BASED_OUTPATIENT_CLINIC_OR_DEPARTMENT_OTHER): Payer: Medicare Other | Admitting: Family

## 2015-12-02 ENCOUNTER — Encounter: Payer: Self-pay | Admitting: Family

## 2015-12-02 VITALS — BP 151/72 | HR 79 | Temp 98.6°F | Resp 16 | Ht 61.0 in | Wt 135.0 lb

## 2015-12-02 DIAGNOSIS — D649 Anemia, unspecified: Secondary | ICD-10-CM | POA: Insufficient documentation

## 2015-12-02 DIAGNOSIS — D509 Iron deficiency anemia, unspecified: Secondary | ICD-10-CM

## 2015-12-02 DIAGNOSIS — D638 Anemia in other chronic diseases classified elsewhere: Secondary | ICD-10-CM

## 2015-12-02 DIAGNOSIS — Z7901 Long term (current) use of anticoagulants: Secondary | ICD-10-CM | POA: Insufficient documentation

## 2015-12-02 DIAGNOSIS — R040 Epistaxis: Secondary | ICD-10-CM | POA: Insufficient documentation

## 2015-12-02 DIAGNOSIS — G479 Sleep disorder, unspecified: Secondary | ICD-10-CM | POA: Insufficient documentation

## 2015-12-02 LAB — CBC WITH DIFFERENTIAL (CANCER CENTER ONLY)
BASO#: 0 10*3/uL (ref 0.0–0.2)
BASO%: 0.6 % (ref 0.0–2.0)
EOS%: 4.5 % (ref 0.0–7.0)
Eosinophils Absolute: 0.2 10*3/uL (ref 0.0–0.5)
HEMATOCRIT: 37 % (ref 34.8–46.6)
HEMOGLOBIN: 12.1 g/dL (ref 11.6–15.9)
LYMPH#: 0.9 10*3/uL (ref 0.9–3.3)
LYMPH%: 16.8 % (ref 14.0–48.0)
MCH: 28.7 pg (ref 26.0–34.0)
MCHC: 32.7 g/dL (ref 32.0–36.0)
MCV: 88 fL (ref 81–101)
MONO#: 0.8 10*3/uL (ref 0.1–0.9)
MONO%: 15.5 % — ABNORMAL HIGH (ref 0.0–13.0)
NEUT%: 62.6 % (ref 39.6–80.0)
NEUTROS ABS: 3.3 10*3/uL (ref 1.5–6.5)
Platelets: 303 10*3/uL (ref 145–400)
RBC: 4.21 10*6/uL (ref 3.70–5.32)
RDW: 21.2 % — ABNORMAL HIGH (ref 11.1–15.7)
WBC: 5.3 10*3/uL (ref 3.9–10.0)

## 2015-12-02 NOTE — Progress Notes (Signed)
Hematology and Oncology Follow Up Visit  [name] [id number] [birth year] 82 y.o. 12/02/2015   Principle Diagnosis:  Recurrent iron deficiency anemia Wegener's granulomatosis  Current Therapy:   IV iron as indicated    Interim History:  [name] is here for a follow-up. She is doing fairly well. She did have a fall several days ago and has several bruises on her arms. Thankfully, she was not seriously injured. She is in a wheelchair today and using a cane at home to ambulate.  She received an iron infusion in February. Her iron saturation at that time was 8%.  Her Hgb is improved at 12.1 with an MCV of 88.  She is having fatigue but states that she feels that this is from "age." She has had no episodes of bleeding on Coumadin.  She had her cochlear implant done in April. She can tell a difference at times with her hearing.  No fever, chills, n/v, rash, dizziness, chest pain, palpitations, abdominal pain, no changes in her bowel or bladder habits. No SOB at this time. No lymphadenopathy found on assessment.  The arthritis in her knees and feet can be quite painful at times. She takes Tylenol as needed for this. The neuropathy in her feet is unchanged.  She has maintained a good appetite and is staying hydrated. Her weight is stable.   Medications:    Medication List       This list is accurate as of: 12/02/15  1:46 PM.  Always use your most recent med list.               acetaminophen 650 MG CR tablet  Commonly known as:  TYLENOL  Take 650 mg by mouth 2 (two) times daily. Every 6 hours as needed for pain     b complex vitamins capsule  Take 1 capsule by mouth daily.     calcium-vitamin D 500-200 MG-UNIT tablet  Commonly known as:  OSCAL WITH D  Take 1 tablet by mouth daily.     cholecalciferol 1000 units tablet  Commonly known as:  VITAMIN D  One daily for vitamin D supplement     ferrous sulfate 325 (65 FE) MG tablet  Take 325 mg by mouth 2 (two) times daily with  a meal.     fluticasone 50 MCG/ACT nasal spray  Commonly known as:  FLONASE  Place 1 spray into both nostrils every evening.     Fluticasone-Salmeterol 500-50 MCG/DOSE Aepb  Commonly known as:  ADVAIR DISKUS  INHALE 1 PUFF EVERY 12 HOURS.     gabapentin 300 MG capsule  Commonly known as:  NEURONTIN  Take 300 mg by mouth 3 (three) times daily.     guaiFENesin 600 MG 12 hr tablet  Commonly known as:  MUCINEX  Take 600 mg by mouth 2 (two) times daily.     latanoprost 0.005 % ophthalmic solution  Commonly known as:  XALATAN  Place 1 drop into both eyes at bedtime.     levalbuterol 0.63 MG/3ML nebulizer solution  Commonly known as:  XOPENEX  Take 0.63 mg by nebulization every 6 (six) hours as needed for wheezing or shortness of breath.     multivitamin with minerals tablet  Take 1 tablet by mouth daily.     omeprazole 20 MG capsule  Commonly known as:  PRILOSEC  Take 20 mg by mouth daily.     saccharomyces boulardii 250 MG capsule  Commonly known as:  FLORASTOR  Take 250 mg  by mouth 2 (two) times daily.     SORE THROAT SPRAY 1.4 % Liqd  Generic drug:  phenol  Use as directed 1 spray in the mouth or throat as needed for throat irritation / pain.     sulfamethoxazole-trimethoprim 800-160 MG tablet  Commonly known as:  BACTRIM DS,SEPTRA DS  Take 1 tablet by mouth every Monday, Wednesday, and Friday. HOLD WHILE ON AUGMENTIN, THEN RESUME AS PREVIOUS     timolol 0.5 % ophthalmic solution  Commonly known as:  BETIMOL  Place 1 drop into the left eye daily with breakfast.     warfarin 7.5 MG tablet  Commonly known as:  COUMADIN  Take 7.5 mg by mouth daily.        Allergies:  Allergies  Allergen Reactions  . Adhesive [Tape] Rash    On on lower extremities     Past Medical History, Surgical history, Social history, and Family History were reviewed and updated.  Review of Systems: All other 10 point review of systems is negative.   Physical Exam:  vitals were not  taken for this visit.  Wt Readings from Last 3 Encounters:  11/30/15 136 lb (61.689 kg)  11/26/15 130 lb (58.968 kg)  11/04/15 130 lb (58.968 kg)    Ocular: Sclerae unicteric, pupils equal, round and reactive to light Ear-nose-throat: Oropharynx clear, dentition fair Lymphatic: No cervical supraclavicular or axillary adenopathy Lungs no rales or rhonchi, good excursion bilaterally Heart regular rate and rhythm, no murmur appreciated Abd soft, nontender, positive bowel sounds, no liver or spleen tip palpated on exam, no fluid wave  MSK no focal spinal tenderness, no joint edema Neuro: non-focal, well-oriented, appropriate affect Breasts: Deferred  Lab Results  Component Value Date   WBC 5.3 12/02/2015   HGB 12.1 12/02/2015   HCT 37.0 12/02/2015   MCV 88 12/02/2015   PLT 303 12/02/2015   Lab Results  Component Value Date   FERRITIN 64 09/23/2015   IRON 29* 09/23/2015   TIBC 371 09/23/2015   UIBC 342 09/23/2015   IRONPCTSAT 8* 09/23/2015   Lab Results  Component Value Date   RETICCTPCT 1.6 02/03/2015   RBC 4.21 12/02/2015   RETICCTABS 62.4 02/03/2015   No results found for: KPAFRELGTCHN, LAMBDASER, KAPLAMBRATIO No results found for: IGGSERUM, IGA, IGMSERUM No results found for: Odetta Pink, SPEI   Chemistry      Component Value Date/Time   NA 141 09/15/2015 1342   NA 141 09/15/2015   K 4.1 09/15/2015 1342   CL 109 09/15/2015 1342   CO2 23 09/15/2015 1342   BUN 13 09/15/2015 1342   BUN 13 09/15/2015   CREATININE 0.87 09/15/2015 1342   CREATININE 0.9 09/15/2015   GLU 97 09/15/2015      Component Value Date/Time   CALCIUM 9.9 09/15/2015 1342   ALKPHOS 102 08/12/2015   AST 21 08/12/2015   ALT 15 08/12/2015   BILITOT 0.6 07/28/2014 0545     Impression and Plan: [name] is 82 year old female with iron deficiency anemia and a history of wegener's granulomatosis. She is symptomatic with fatigue at this  time.  We will wait and see what her iron studies show and bring her in next week an infusion if needed.  We will plan to see her back in 6 months for labs and follow-up per her request.  She will contact us with any questions or concerns. We can certainly see her sooner if need be.   Samary Shatz  M, NP 6/1/20171:46 PM

## 2015-12-03 ENCOUNTER — Telehealth: Payer: Self-pay | Admitting: Nurse Practitioner

## 2015-12-03 LAB — FERRITIN: Ferritin: 92 ng/ml (ref 9–269)

## 2015-12-03 LAB — IRON AND TIBC
%SAT: 25 % (ref 21–57)
Iron: 65 ug/dL (ref 41–142)
TIBC: 260 ug/dL (ref 236–444)
UIBC: 195 ug/dL (ref 120–384)

## 2015-12-03 LAB — RETICULOCYTES: RETICULOCYTE COUNT: 1.1 % (ref 0.6–2.6)

## 2015-12-03 NOTE — Telephone Encounter (Addendum)
Spoke to the nurse at the facility and verbalized understanding and appreciation of the update. ----- Message from Eliezer Bottom, NP sent at 12/03/2015 10:27 AM EDT ----- Regarding: Iron Iron studies look good. No infusion needed at this time. Thank you!  Sarah    ----- Message -----    From: [facility]    Sent: 12/02/2015   1:40 PM      To: Eliezer Bottom, NP

## 2015-12-09 ENCOUNTER — Encounter: Payer: Self-pay | Admitting: Nurse Practitioner

## 2015-12-09 ENCOUNTER — Non-Acute Institutional Stay: Payer: Medicare Other | Admitting: Internal Medicine

## 2015-12-09 ENCOUNTER — Encounter: Payer: Self-pay | Admitting: Internal Medicine

## 2015-12-09 VITALS — BP 128/62 | HR 88 | Temp 97.7°F | Ht 61.0 in | Wt 135.0 lb

## 2015-12-09 DIAGNOSIS — M25512 Pain in left shoulder: Secondary | ICD-10-CM

## 2015-12-09 DIAGNOSIS — R5382 Chronic fatigue, unspecified: Secondary | ICD-10-CM | POA: Diagnosis not present

## 2015-12-09 DIAGNOSIS — R918 Other nonspecific abnormal finding of lung field: Secondary | ICD-10-CM | POA: Diagnosis not present

## 2015-12-09 DIAGNOSIS — R5383 Other fatigue: Secondary | ICD-10-CM | POA: Insufficient documentation

## 2015-12-09 DIAGNOSIS — R609 Edema, unspecified: Secondary | ICD-10-CM

## 2015-12-09 DIAGNOSIS — M3130 Wegener's granulomatosis without renal involvement: Secondary | ICD-10-CM

## 2015-12-09 DIAGNOSIS — J189 Pneumonia, unspecified organism: Secondary | ICD-10-CM

## 2015-12-09 DIAGNOSIS — M25559 Pain in unspecified hip: Secondary | ICD-10-CM | POA: Insufficient documentation

## 2015-12-09 DIAGNOSIS — I809 Phlebitis and thrombophlebitis of unspecified site: Secondary | ICD-10-CM | POA: Insufficient documentation

## 2015-12-09 DIAGNOSIS — N3942 Incontinence without sensory awareness: Secondary | ICD-10-CM

## 2015-12-09 DIAGNOSIS — Z9621 Cochlear implant status: Secondary | ICD-10-CM | POA: Diagnosis not present

## 2015-12-09 DIAGNOSIS — I2699 Other pulmonary embolism without acute cor pulmonale: Secondary | ICD-10-CM | POA: Insufficient documentation

## 2015-12-09 DIAGNOSIS — H04543 Stenosis of bilateral lacrimal canaliculi: Secondary | ICD-10-CM | POA: Diagnosis not present

## 2015-12-09 DIAGNOSIS — M25569 Pain in unspecified knee: Secondary | ICD-10-CM | POA: Insufficient documentation

## 2015-12-09 DIAGNOSIS — K219 Gastro-esophageal reflux disease without esophagitis: Secondary | ICD-10-CM | POA: Insufficient documentation

## 2015-12-09 DIAGNOSIS — M1990 Unspecified osteoarthritis, unspecified site: Secondary | ICD-10-CM | POA: Insufficient documentation

## 2015-12-09 DIAGNOSIS — G629 Polyneuropathy, unspecified: Secondary | ICD-10-CM | POA: Insufficient documentation

## 2015-12-09 HISTORY — DX: Pain in left shoulder: M25.512

## 2015-12-09 HISTORY — DX: Other fatigue: R53.83

## 2015-12-09 MED ORDER — MIRABEGRON ER 25 MG PO TB24: ORAL_TABLET | ORAL | Status: DC

## 2015-12-09 NOTE — Progress Notes (Signed)
Patient ID: HAVEN PYLANT, female   DOB: [birth year], 82 y.o.   MRN: [id number]    [place] Room Number: 818  Place of Service: Clinic (12)     Allergies  Allergen Reactions  . Adhesive [Tape] Rash    On on lower extremities     Chief Complaint  Patient presents with  . Medical Management of Chronic Issues    6 month medication management Wegener's granulomatosis, anemia, anxiety  . Ear Pain    painful and red left ear. On 10/27/15 had Cochlear implant by Dr. Benjamine Mola  . MMSE    30/30 pass clock drawing    HPI:  Chronic fatigue - chronic complaint that is basically unchanged. Patient presents as a relatively high energy person in the exam room, but complains of chronic fatigue and low energy.  Edema, unspecified type - persistent lower leg edema at about 1-2+ bilaterally  Urinary incontinence without sensory awareness - wears pads for protection. Patient says she has been tried on a number of medications in the past but none seem to be helpful.  Pulmonary nodules - chronic problem present on CT scan of the chest. Last CT of the chest was done 07/08/2015. It described a medial right lower lobe 4 mm solid pulmonary nodule which appeared slightly decreased from the CT of the chest done 07/21/2014. There was also a left upper lobe 6 mm irregular pulmonary nodule slightly decreased from an 8 mm nodule previous year. The superior segment of the left lower lobe and a 0.9 x 0.8 cm pulmonary nodule which was decreased from a nodule measuring 1.4 x 1.1 cm. There was an irregular 2.3 x 0.9 cm basilar left lower lobe pulmonary nodule decreased from 3.7 x 1.7 cm. No new nodules were noted. Patient is being followed by Dr. Lavell Luster.   Lacrimal canalicular stenosis, bilateral - Has had previous surgeries. Has chronic tearing of the eyes related to this  HCAP (healthcare-associated pneumonia) - resolved   Cochlear implant status -patient was doing well with  her cochlear implant until a few days ago when the left ear turned a fiery red color  and became tender. She says she is ready contacted Dr. Benjamine Mola and is expecting a call back from him today .  Wegener's granulomatosis - chronic illness that seems to be stable at the present time. No active disease.  Medications: Patient's Medications  New Prescriptions   No medications on file  Previous Medications   ACETAMINOPHEN (TYLENOL) 650 MG CR TABLET    Take 650 mg by mouth 2 (two) times daily. Every 6 hours as needed for pain   B COMPLEX VITAMINS CAPSULE    Take 1 capsule by mouth daily.   CALCIUM-VITAMIN D (OSCAL WITH D) 500-200 MG-UNIT PER TABLET    Take 1 tablet by mouth daily.    CHOLECALCIFEROL (VITAMIN D) 1000 UNITS TABLET    One daily for vitamin D supplement   FERROUS SULFATE 325 (65 FE) MG TABLET    Take 325 mg by mouth 2 (two) times daily with a meal.   FLUTICASONE (FLONASE) 50 MCG/ACT NASAL SPRAY    Place 1 spray into both nostrils every evening.    FLUTICASONE-SALMETEROL (ADVAIR DISKUS) 500-50 MCG/DOSE AEPB    INHALE 1 PUFF EVERY 12 HOURS.   GABAPENTIN (NEURONTIN) 300 MG CAPSULE    Take 300 mg by mouth 3 (three) times daily.    GUAIFENESIN (MUCINEX) 600 MG 12 HR TABLET    Take  600 mg by mouth 2 (two) times daily.   LATANOPROST (XALATAN) 0.005 % OPHTHALMIC SOLUTION    Place 1 drop into both eyes at bedtime.   LEVALBUTEROL (XOPENEX) 0.63 MG/3ML NEBULIZER SOLUTION    Take 0.63 mg by nebulization every 6 (six) hours as needed for wheezing or shortness of breath.   MOXIFLOXACIN (AVELOX) 400 MG TABLET    Take 400 mg by mouth daily.   MULTIPLE VITAMINS-MINERALS (MULTIVITAMIN WITH MINERALS) TABLET    Take 1 tablet by mouth daily.   OMEPRAZOLE (PRILOSEC) 20 MG CAPSULE    Take 20 mg by mouth daily.   PHENOL (SORE THROAT SPRAY) 1.4 % LIQD    Use as directed 1 spray in the mouth or throat as needed for throat irritation / pain.   SACCHAROMYCES BOULARDII (FLORASTOR) 250 MG CAPSULE    Take 250 mg by  mouth 2 (two) times daily.   SULFAMETHOXAZOLE-TRIMETHOPRIM (BACTRIM DS,SEPTRA DS) 800-160 MG PER TABLET    Take 1 tablet by mouth every Monday, Wednesday, and Friday. HOLD WHILE ON AUGMENTIN, THEN RESUME AS PREVIOUS   TIMOLOL (BETIMOL) 0.5 % OPHTHALMIC SOLUTION    Place 1 drop into the left eye daily with breakfast.    WARFARIN (COUMADIN) 3 MG TABLET    Take 3 mg by mouth daily.  Modified Medications   No medications on file  Discontinued Medications   No medications on file     Review of Systems  Constitutional: Negative for fever, chills and diaphoresis.       Frail and generally weak.  HENT: Positive for hearing loss (Deaf on left, hearing aid on right ) and nosebleeds. Negative for congestion, ear discharge, ear pain and tinnitus.        Left cochlear implant has been done. Left ear has a fiery red in color and is tender to touch. There is some swelling of the pinna.  Eyes: Negative.  Negative for pain and redness.  Respiratory: Positive for cough and shortness of breath. Negative for wheezing (Intermittent).        History Wegener's granulomatosis. History of chronic sinusitis related to her Wegener's. Chronic cough and yellowish phlegm production   Cardiovascular: Positive for leg swelling. Negative for chest pain and palpitations.       Trace R+L foot/ankle, R>L  Gastrointestinal: Negative for nausea, abdominal pain, diarrhea and constipation.       History of GERD.  Endocrine: Negative for polydipsia.  Genitourinary: Positive for frequency. Negative for dysuria, urgency, hematuria and flank pain.       Frequency induced by furosemide. Episodes of incontinence of urine.  Musculoskeletal: Positive for arthralgias and gait problem. Negative for myalgias, back pain and neck pain.       Generalized weakness Left upper extremity pain, limited ROM of the left shoulder  Skin: Negative.  Negative for rash.  Neurological: Positive for weakness. Negative for dizziness, tremors, seizures  and headaches.       Neuropathic leg/foot pain. Generalized weakness BLE  Hematological: Does not bruise/bleed easily.       Chronic anemia.  Psychiatric/Behavioral: Negative for suicidal ideas and hallucinations. The patient is nervous/anxious.        Recently her husband has been admitted to SNF    Filed Vitals:   12/09/15 1422  BP: 128/62  Pulse: 88  Temp: 97.7 F (36.5 C)  Height: '5\' 1"'$  (1.549 m)  Weight: 135 lb (61.236 kg)  SpO2: 96%   Wt Readings from Last 3 Encounters:  12/09/15 135 lb (  61.236 kg)  12/02/15 135 lb (61.236 kg)  11/30/15 136 lb (61.689 kg)    Body mass index is 25.52 kg/(m^2).  Physical Exam  Constitutional: She is oriented to person, place, and time. She appears well-developed. No distress.  Thin and frail. Weight stable  since 3/16  HENT:  Head: Normocephalic and atraumatic.  Nose: Nose normal.  Mouth/Throat: Oropharynx is clear and moist. No oropharyngeal exudate.  Bilateral hearing loss. Hearing aid - right ear Left cochlear implant. Fiery red in coloration and tenderness of the left pinna as well as mild swelling.  Eyes: Conjunctivae and EOM are normal. Pupils are equal, round, and reactive to light. Right eye exhibits no discharge. Left eye exhibits discharge. No scleral icterus.  Neck: Normal range of motion. Neck supple. No JVD present. No tracheal deviation present. No thyromegaly present.  Cardiovascular: Normal rate, regular rhythm, normal heart sounds and intact distal pulses.   No murmur heard. Pulmonary/Chest: Effort normal. No stridor. No respiratory distress. She has no wheezes. She has rales. She exhibits no tenderness.  Abdominal: Soft. Bowel sounds are normal. She exhibits no distension and no mass. There is no tenderness. There is no rebound and no guarding.  Musculoskeletal: Normal range of motion. She exhibits edema (trace) and tenderness.  R+L foot/ankle, R>L, trace to 1+ Left upper extremity pain. L shoulder limited ROM.    Lymphadenopathy:    She has no cervical adenopathy.  Neurological: She is alert and oriented to person, place, and time. She has normal reflexes. No cranial nerve deficit. She exhibits normal muscle tone. Coordination normal.  The patient has history of neuropathy of bilateral lower extremities which resulted in generalized weakness and pain of legs. She is only able to ambulate a few steps with walker in her room and depends on w/c to go further. In order to maintain her quality of life and mobility, she needs wheelchair to travel to dinning room for meals and attend activities on the [place] where she resides. Due to her left upper extremity pain related to an injury occurred 4-5 years, she is unable to propel manual wheelchair or steer a scooter in her living space to maintain her mobility. Mrs Bahar manages her own affairs and finances with minimal assistance. She has worked with therapy in the past and has been deemed to be safe to operate a power chair.  12/09/15 MMSE 30/30. Passed clock drawing.  Skin: Skin is warm and dry. No rash noted. She is not diaphoretic. No erythema. No pallor.  Corns on the left foot at the tip of the third toe and at the fifth metacarpal phalangeal joint slightly lateral.  Psychiatric: She has a normal mood and affect. Her behavior is normal. Judgment and thought content normal.  Emotional when voiciing her worry about not being able to get to SNF to visit her husband.      Labs reviewed: Lab Summary Latest Ref Rng 12/02/2015 10/27/2015 10/05/2015 09/28/2015 09/23/2015 09/23/2015 09/22/2015  Hemoglobin 11.6 - 15.9 g/dL 12.1 10.7(L) 11.1(A) 9.6(A) 9.9(L) 8.4(A) 8.1(A)  Hematocrit 34.8 - 46.6 % 37.0 34.2(L) 36 31(A) 32.2(L) 27(A) 27(A)  White count 3.9 - 10.0 10e3/uL 5.3 4.4 3.8 4.3 3.6(L) 4.4 4.7  Platelet count 145 - 400 10e3/uL 303 324 354 384 449(H) 353 398  Sodium 135-145 mmol/L (None) (None) (None) (None) (None) (None) (None)  Potassium 3.5-5.1 mmol/L  (None) (None) (None) (None) (None) (None) (None)  Calcium - (None) (None) (None) (None) (None) (None) (None)  Phosphorus - (None) (  None) (None) (None) (None) (None) (None)  Creatinine 0.50-1.10 mg/dL (None) (None) (None) (None) (None) (None) (None)  AST - (None) (None) (None) (None) (None) (None) (None)  Alk Phos - (None) (None) (None) (None) (None) (None) (None)  Bilirubin - (None) (None) (None) (None) (None) (None) (None)  Glucose 70-99 mg/dL (None) (None) (None) (None) (None) (None) (None)  Cholesterol - (None) (None) (None) (None) (None) (None) (None)  HDL cholesterol - (None) (None) (None) (None) (None) (None) (None)  Triglycerides - (None) (None) (None) (None) (None) (None) (None)  LDL Direct - (None) (None) (None) (None) (None) (None) (None)  LDL Calc - (None) (None) (None) (None) (None) (None) (None)  Total protein - (None) (None) (None) (None) (None) (None) (None)  Albumin - (None) (None) (None) (None) (None) (None) (None)   Lab Results  Component Value Date   TSH 1.56 05/13/2015   Lab Results  Component Value Date   BUN 13 09/15/2015   BUN 13 09/15/2015   BUN 13 08/12/2015   Lab Results  Component Value Date   CREATININE 0.87 09/15/2015   CREATININE 0.9 09/15/2015   CREATININE 0.8 08/12/2015   Lab Results  Component Value Date   HGBA1C 5.8* 07/28/2014       Assessment/Plan  1. Chronic fatigue unchanged  2. Edema, unspecified type Stable; recommended a trial of compression stockings   3. Urinary incontinence without sensory awareness Operative appointment with urologist, but she says that she does not want to go back. She has been tried on multiple medications as well as Kegel's exercises, but none seem to be helpful. She is willing to try  Myrbetriq 25 mg qd.  4. Pulmonary nodules Chronic. followed by Dr. Lake Bells, pulmonologist  5. Lacrimal canalicular stenosis, bilateral Culture exudate.  6. HCAP (healthcare-associated pneumonia) Noted on CXR  11/25/15. Treated with Avelox.  7. Cochlear implant status Red left ear. She expects to hear from Dr. Benjamine Mola soon.  8. Left shoulder pain Shoulder may be frozen  Physical therapy for increased range of motion and strengthening

## 2015-12-10 ENCOUNTER — Non-Acute Institutional Stay: Payer: Medicare Other | Admitting: Nurse Practitioner

## 2015-12-10 ENCOUNTER — Encounter: Payer: Self-pay | Admitting: Nurse Practitioner

## 2015-12-10 DIAGNOSIS — M3130 Wegener's granulomatosis without renal involvement: Secondary | ICD-10-CM | POA: Diagnosis not present

## 2015-12-10 DIAGNOSIS — K219 Gastro-esophageal reflux disease without esophagitis: Secondary | ICD-10-CM

## 2015-12-10 DIAGNOSIS — H6012 Cellulitis of left external ear: Secondary | ICD-10-CM | POA: Diagnosis not present

## 2015-12-10 DIAGNOSIS — K5900 Constipation, unspecified: Secondary | ICD-10-CM

## 2015-12-10 DIAGNOSIS — H6010 Cellulitis of external ear, unspecified ear: Secondary | ICD-10-CM | POA: Insufficient documentation

## 2015-12-10 DIAGNOSIS — J189 Pneumonia, unspecified organism: Secondary | ICD-10-CM | POA: Diagnosis not present

## 2015-12-10 DIAGNOSIS — G609 Hereditary and idiopathic neuropathy, unspecified: Secondary | ICD-10-CM

## 2015-12-10 DIAGNOSIS — D638 Anemia in other chronic diseases classified elsewhere: Secondary | ICD-10-CM

## 2015-12-10 DIAGNOSIS — R609 Edema, unspecified: Secondary | ICD-10-CM

## 2015-12-10 NOTE — Assessment & Plan Note (Signed)
11/26/15 CXR mild patchy density in RUL compatible with pneumonia. 11/27/15 10 day course of Avelox.

## 2015-12-10 NOTE — Assessment & Plan Note (Signed)
Stable, continue Omeprazole 20mg daily.  

## 2015-12-10 NOTE — Progress Notes (Signed)
Patient ID: [name], female   DOB: 12-05-1932, 82 y.o.   MRN: [id number]  Location:  [facility] Room Number: [id number] Place of Service: [facility] Provider:  Lennie Odor Giamarie Bueche NP  GREEN, Viviann Spare, MD  Patient Care Team: Estill Dooms, MD as PCP - General (Internal Medicine) Unice Bailey, MD as Consulting Physician (Rheumatology) Rozetta Nunnery, MD as Consulting Physician (Otolaryngology) Marcial Pacas, MD as Consulting Physician (Neurology) Michel Bickers, MD as Consulting Physician (Infectious Diseases) Volanda Napoleon, MD as Consulting Physician (Oncology) Juanito Doom, MD as Consulting Physician (Pulmonary Disease) Tanda Rockers, MD as Consulting Physician (Pulmonary Disease) Estill Dooms, MD as Consulting Physician (Geriatric Medicine) Coralie Keens, MD as Consulting Physician (General Surgery) Jaymarion Trombly Otho Darner, NP as Nurse Practitioner (Nurse Practitioner)  Extended Emergency Contact Information Primary Emergency Contact: Haygood,Sandra Address: [address] [place] [address] Johnnette Litter of [place] Phone: [phone] Relation: Daughter Secondary Emergency Contact: Quashie,[name] Address: [address] [place]          [place],  [address] [place] of [place] Phone: [phone] Mobile Phone: [phone] Relation: Son  Code Status:  DNR Goals of care: Advanced Directive information Advanced Directives 12/09/2015  Does patient have an advance directive? Yes  Type of Paramedic of [place];Living will  Does patient want to make changes to advanced directive? -  Copy of advanced directive(s) in chart? Yes     Chief Complaint  Patient presents with  . Acute Visit    Pneumonia    HPI:  Pt is a 82 y.o. female seen today for medical management of chronic diseases. Anemia, Hgb improved to 11.1 09/15/15, taking Fe, workup and tx under Hematology. Peripheral neuropathy, w/c for mobility,  takes Gabapentin 300mg  tid. GERD symptomatic free on Omeprazole 20mg  daily.    Recently fully treated PNA, at her baseline chronic cough. Left ear cellulitis, taking PCN, not as painful as prior, but entire left ear redness, swelling, and heat persisted.    Past Medical History  Diagnosis Date  . Wegener's granulomatosis ([place])   . OA (osteoarthritis)   . Blindness of one eye   . Hearing loss in left ear     hearing aid both ears  . Bladder incontinence   . Glaucoma   . Clotting disorder ([place])   . History of pulmonary embolism 1997  . Pneumonia 12/15  . Multiple allergies   . Chronic sinusitis   . Acute respiratory failure with hypoxia ([place])   . Interstitial emphysema ([place])   . Aspergillosis ([place])   . Stroke [facility])     ??? blind left eye  . Cancer ([place])     melanoma   (chemo)  . Difficult intubation     Difficult airway with intubation for ARF 07/22/14 (due to anterior larynx, also had mucous plug)  . Anemia   . Neuropathy ([place])   . Shortness of breath dyspnea   . Fatigue 12/09/2015  . Left shoulder pain 12/09/2015   Past Surgical History  Procedure Laterality Date  . Tubal ligation    . Pubovaginal sling    . Cataract extraction    . Melanoma excision      left leg  . Vena cava filter placement  1997    [place] filter  . Knee arthroscopy  2006    rt   . Inguinal hernia repair Left 02/17/2014  Procedure: LEFT INGUINAL HERNIA REPAIR WITH MESH;  Surgeon: Harl Bowie, MD;  Location: [facility];  Service: General;  Laterality: Left;  . Insertion of mesh N/A 02/17/2014    Procedure: INSERTION OF MESH;  Surgeon: Harl Bowie, MD;  Location: [facility];  Service: General;  Laterality: N/A;  . Tear duct probing    . Tympanomastoidectomy Left 04/28/2015  . Tympanomastoidectomy Left 04/28/2015    Procedure: LEFT TYMPANOMASTOIDECTOMY;  Surgeon: Leta Baptist, MD;  [facility];  Service: ENT;  Laterality: Left;  . Bronchosocpy      pt.  states she has had over 10 in last 20 yrs.  . Cochlear implant Left 10/27/2015    Procedure: LEFT COCHLEAR IMPLANT;  Surgeon: Leta Baptist, MD;  Location: [facility];  Service: ENT;  Laterality: Left;    Allergies  Allergen Reactions  . Adhesive [Tape] Rash    On on lower extremities       Medication List       This list is accurate as of: 12/10/15 12:10 PM.  Always use your most recent med list.               acetaminophen 650 MG CR tablet  Commonly known as:  TYLENOL  Take 650 mg by mouth 2 (two) times daily. Every 6 hours as needed for pain     b complex vitamins capsule  Take 1 capsule by mouth daily.     calcium-vitamin D 500-200 MG-UNIT tablet  Commonly known as:  OSCAL WITH D  Take 1 tablet by mouth daily.     cholecalciferol 1000 units tablet  Commonly known as:  VITAMIN D  One daily for vitamin D supplement     ferrous sulfate 325 (65 FE) MG tablet  Take 325 mg by mouth 2 (two) times daily with a meal.     fluticasone 50 MCG/ACT nasal spray  Commonly known as:  FLONASE  Place 1 spray into both nostrils every evening.     Fluticasone-Salmeterol 500-50 MCG/DOSE Aepb  Commonly known as:  ADVAIR DISKUS  INHALE 1 PUFF EVERY 12 HOURS.     gabapentin 300 MG capsule  Commonly known as:  NEURONTIN  Take 300 mg by mouth 3 (three) times daily.     guaiFENesin 600 MG 12 hr tablet  Commonly known as:  MUCINEX  Take 600 mg by mouth 2 (two) times daily.     latanoprost 0.005 % ophthalmic solution  Commonly known as:  XALATAN  Place 1 drop into both eyes at bedtime.     levalbuterol 0.63 MG/3ML nebulizer solution  Commonly known as:  XOPENEX  Take 0.63 mg by nebulization every 6 (six) hours as needed for wheezing or shortness of breath.     mirabegron ER 25 MG Tb24 tablet  Commonly known as:  MYRBETRIQ  One daily to help bladder control     moxifloxacin 400 MG tablet  Commonly known as:  AVELOX  Take 400 mg by mouth daily.     multivitamin with minerals tablet    Take 1 tablet by mouth daily.     omeprazole 20 MG capsule  Commonly known as:  PRILOSEC  Take 20 mg by mouth daily.     saccharomyces boulardii 250 MG capsule  Commonly known as:  FLORASTOR  Take 250 mg by mouth 2 (two) times daily.     SORE THROAT SPRAY 1.4 % Liqd  Generic drug:  phenol  Use as directed 1  spray in the mouth or throat as needed for throat irritation / pain.     sulfamethoxazole-trimethoprim 800-160 MG tablet  Commonly known as:  BACTRIM DS,SEPTRA DS  Take 1 tablet by mouth every Monday, Wednesday, and Friday. HOLD WHILE ON AUGMENTIN, THEN RESUME AS PREVIOUS     timolol 0.5 % ophthalmic solution  Commonly known as:  BETIMOL  Place 1 drop into the left eye daily with breakfast.     warfarin 3 MG tablet  Commonly known as:  COUMADIN  Take 3 mg by mouth daily.        Review of Systems  Constitutional: Negative for fever, chills and diaphoresis.       Frail and generally weak.  HENT: Positive for hearing loss (Deaf on left, hearing aid on right ) and nosebleeds. Negative for congestion, ear discharge, ear pain and tinnitus.        Left cochlear implant pending.  Eyes: Negative.  Negative for pain and redness.  Respiratory: Positive for cough and shortness of breath. Negative for wheezing (Intermittent).        History Wegener's granulomatosis. History of chronic sinusitis related to her Wegener's. Chronic cough and yellowish phlegm production   Cardiovascular: Positive for leg swelling. Negative for chest pain and palpitations.       Trace R+L foot/ankle, R>L  Gastrointestinal: Negative for nausea, abdominal pain, diarrhea and constipation.       History of GERD.  Endocrine: Negative for polydipsia.  Genitourinary: Positive for frequency. Negative for dysuria, urgency, hematuria and flank pain.       Frequency induced by furosemide. Episodes of incontinence of urine.  Musculoskeletal: Positive for arthralgias and gait problem. Negative for myalgias, back  pain and neck pain.       Generalized weakness Left upper extremity pain, limited ROM of the left shoulder  Skin: Negative.  Negative for rash.       Left ear cellulitis, taking PCN, not as painful as prior, but entire left ear redness, swelling, and heat persisted.     Neurological: Positive for weakness. Negative for dizziness, tremors, seizures and headaches.       Neuropathic leg/foot pain. Generalized weakness BLE  Hematological: Does not bruise/bleed easily.       Chronic anemia.  Psychiatric/Behavioral: Negative for suicidal ideas and hallucinations. The patient is nervous/anxious.        Recently her husband has been admitted to SNF    Immunization History  Administered Date(s) Administered  . Influenza Split 03/04/2012, 03/11/2014, 04/01/2015  . Influenza,inj,Quad PF,36+ Mos 03/03/2013  . Influenza-Unspecified 04/01/2015  . PPD Test 05/25/2015  . Pneumococcal Conjugate-13 07/01/2015  . Pneumococcal Polysaccharide-23 09/02/2015  . Zoster 09/30/1994   Pertinent  Health Maintenance Due  Topic Date Due  . INFLUENZA VACCINE  02/01/2016  . DEXA SCAN  Completed  . PNA vac Low Risk Adult  Completed   Fall Risk  12/09/2015 09/23/2015 09/09/2015 02/03/2015 12/04/2014  Falls in the past year? No Yes Yes Yes Yes  Number falls in past yr: - 2 or more 2 or more 2 or more 2 or more  Injury with Fall? - No No - No   Functional Status Survey:    Filed Vitals:   12/10/15 1108  Temp: 98.7 F (37.1 C)  TempSrc: Tympanic   There is no weight on file to calculate BMI. Physical Exam  Constitutional: She is oriented to person, place, and time. She appears well-developed. No distress.  Thin and frail. Weight stable  since  3/16  HENT:  Head: Normocephalic and atraumatic.  Nose: Nose normal.  Mouth/Throat: Oropharynx is clear and moist. No oropharyngeal exudate.  Bilateral hearing loss. Hearing aid - right ear Pending left cochlear implant  Eyes: Conjunctivae and EOM are normal. Pupils  are equal, round, and reactive to light. Right eye exhibits no discharge. Left eye exhibits no discharge. No scleral icterus.  Neck: Normal range of motion. Neck supple. No JVD present. No tracheal deviation present. No thyromegaly present.  Cardiovascular: Normal rate, regular rhythm, normal heart sounds and intact distal pulses.   No murmur heard. Pulmonary/Chest: Effort normal. No stridor. No respiratory distress. She has no wheezes. She has rales. She exhibits no tenderness.  Abdominal: Soft. Bowel sounds are normal. She exhibits no distension and no mass. There is no tenderness. There is no rebound and no guarding.  Musculoskeletal: Normal range of motion. She exhibits edema (trace) and tenderness.  R+L foot/ankle, R>L, trace to 1+ Left upper extremity pain. L shoulder limited ROM  Lymphadenopathy:    She has no cervical adenopathy.  Neurological: She is alert and oriented to person, place, and time. She has normal reflexes. No cranial nerve deficit. She exhibits normal muscle tone. Coordination normal.  The patient has history of neuropathy of bilateral lower extremities which resulted in generalized weakness and pain of legs, she is only able to ambulates a few steps with walker in her room and depend on w/c to go further. In order to maintain her quality of life and mobility, she needs wheelchair to travel to dinning room for meals and attend activities on the [place] where she resides. Due to her left upper extremity pain related to an injury occurred 4-5 years, she is unable to propel manual wheelchair or steer a scooter in her living space to maintain her mobility. Mrs Frazzini manages her own affairs and finances with minimal assistance. She has worked with therapy in the past and has been deemed to be safe to operate a power chair.    Skin: Skin is warm and dry. No rash noted. She is not diaphoretic. No erythema. No pallor.  Left ear cellulitis, taking PCN, not as painful as  prior, but entire left ear redness, swelling, and heat persisted.     Psychiatric: She has a normal mood and affect. Her behavior is normal. Judgment and thought content normal.  Emotional when voice her worry not able to walk to SNF to visit her busband.     Labs reviewed:  Recent Labs  04/20/15 1409  09/15/15 09/15/15 1342 11/26/15  NA 140  < > 141 141 137  K 4.2  < > 4.1 4.1 4.1  CL 106  --   --  109  --   CO2 28  --   --  23  --   GLUCOSE 104*  --   --  97  --   BUN 12  < > 13 13 13   CREATININE 0.79  < > 0.9 0.87 0.8  CALCIUM 10.0  --   --  9.9  --   < > = values in this interval not displayed.  Recent Labs  07/25/15 08/12/15 11/26/15  AST 22 21 33  ALT 14 15 20   ALKPHOS 178* 102 171*    Recent Labs  02/03/15 1335  09/23/15 0908  10/27/15 0711 11/26/15 12/02/15 1327  WBC 5.1  < > 3.6*  < > 4.4 11.5 5.3  NEUTROABS 3.4  --  2.1  --   --   --  3.3  HGB 12.3  < > 9.9*  < > 10.7* 12.3 12.1  HCT 37.0  < > 32.2*  < > 34.2* 38 37.0  MCV 96  < > 80*  --  84.0  --  88  PLT 302  < > 449*  < > 324 280 303  < > = values in this interval not displayed. Lab Results  Component Value Date   TSH 1.56 05/13/2015   Lab Results  Component Value Date   HGBA1C 5.8* 07/28/2014   No results found for: CHOL, HDL, LDLCALC, LDLDIRECT, TRIG, CHOLHDL  Significant Diagnostic Results in last 30 days:  No results found.  Assessment/Plan  HCAP (healthcare-associated pneumonia) 11/26/15 CXR mild patchy density in RUL compatible with pneumonia. 11/27/15 10 day course of Avelox.    WEGENERS GRANULOMATOSIS 11/26/15 CXR mild patchy density in RUL compatible with pneumonia. 11/27/15 10 day course of Avelox.    Constipation Stable, continue Miralax.   GERD (gastroesophageal reflux disease) Stable, continue Omeprazole 20mg  daily   Peripheral neuropathy (HCC) Lower extremities weakness, motorized w/c for mobility  Anemia of chronic disease 10/05/15 Hgb 11.1 11/26/15 Hgb  12.3  Edema Off diuretics. Only trace edema in ankles-no longer needing TED. 11/26/15 Na 137, K 4.1, Bun 13, creat 0.82   Cellulitis of external ear Complete 7 day course of PNC, apply Mycolog II cream bid until healed.     Family/ staff Communication: supervision for safety.   Labs/tests ordered: PT/INR

## 2015-12-10 NOTE — Assessment & Plan Note (Signed)
10/05/15 Hgb 11.1 11/26/15 Hgb 12.3

## 2015-12-10 NOTE — Assessment & Plan Note (Signed)
Lower extremities weakness, motorized w/c for mobility

## 2015-12-10 NOTE — Assessment & Plan Note (Signed)
Stable, continue Miralax.  

## 2015-12-10 NOTE — Assessment & Plan Note (Signed)
Complete 7 day course of PNC, apply Mycolog II cream bid until healed.

## 2015-12-10 NOTE — Assessment & Plan Note (Addendum)
Off diuretics. Only trace edema in ankles-no longer needing TED. 11/26/15 Na 137, K 4.1, Bun 13, creat 0.82

## 2016-02-02 ENCOUNTER — Non-Acute Institutional Stay: Payer: Medicare Other | Admitting: Nurse Practitioner

## 2016-02-02 ENCOUNTER — Encounter: Payer: Self-pay | Admitting: Nurse Practitioner

## 2016-02-02 DIAGNOSIS — G629 Polyneuropathy, unspecified: Secondary | ICD-10-CM | POA: Diagnosis not present

## 2016-02-02 DIAGNOSIS — M3130 Wegener's granulomatosis without renal involvement: Secondary | ICD-10-CM | POA: Diagnosis not present

## 2016-02-02 DIAGNOSIS — D638 Anemia in other chronic diseases classified elsewhere: Secondary | ICD-10-CM | POA: Diagnosis not present

## 2016-02-02 DIAGNOSIS — K5900 Constipation, unspecified: Secondary | ICD-10-CM | POA: Diagnosis not present

## 2016-02-02 DIAGNOSIS — K219 Gastro-esophageal reflux disease without esophagitis: Secondary | ICD-10-CM | POA: Diagnosis not present

## 2016-02-02 DIAGNOSIS — R609 Edema, unspecified: Secondary | ICD-10-CM

## 2016-02-02 NOTE — Progress Notes (Signed)
Location:   [facility] Room Number: [place] of Service:  ALF (13) Provider:  Rosaly Labarbera  NP   Patient Care Team: Estill Dooms, MD as PCP - General (Internal Medicine) Unice Bailey, MD as Consulting Physician (Rheumatology) Rozetta Nunnery, MD as Consulting Physician (Otolaryngology) Marcial Pacas, MD as Consulting Physician (Neurology) Michel Bickers, MD as Consulting Physician (Infectious Diseases) Volanda Napoleon, MD as Consulting Physician (Oncology) Juanito Doom, MD as Consulting Physician (Pulmonary Disease) Tanda Rockers, MD as Consulting Physician (Pulmonary Disease) Estill Dooms, MD as Consulting Physician (Geriatric Medicine) Coralie Keens, MD as Consulting Physician (General Surgery) Jamerius Boeckman Otho Darner, NP as Nurse Practitioner (Nurse Practitioner)  Extended Emergency Contact Information Primary Emergency Contact: Haygood,Sandra Address: [address] [place] [address] [place] Phone: [phone] Relation: Daughter Secondary Emergency Contact: Gwynn,[name] Address: [address] [place], [place] [address] [place] Phone: [phone] Mobile Phone: [phone] Relation: Son  Code Status:  Full Code Goals of care: Advanced Directive information Advanced Directives 02/02/2016  Does patient have an advance directive? Yes  Type of Advance Directive [place]  Does patient want to make changes to advanced directive? No - Patient declined  Copy of advanced directive(s) in chart? Yes  Would patient like information on creating an advanced directive? -     Chief Complaint  Patient presents with  . Medical Management of Chronic Issues    HPI:  Pt is a 82 y.o. female seen today for medical management of chronic diseases.    Hx of Anemia, Hgb improved to 12.1 12/02/15, taking Fe, iron level improved from 29 to 65,  workup and tx under  Hematology. Peripheral neuropathy, w/c for mobility, takes Gabapentin 300mg  tid. GERD symptomatic free on Omeprazole 20mg  daily.   Past Medical History:  Diagnosis Date  . Acute respiratory failure with hypoxia ([place])   . Anemia   . Aspergillosis ([place])   . Bladder incontinence   . Blindness of one eye   . Cancer ([place])    melanoma   ([place])  . Chronic sinusitis   . Clotting disorder ([place])   . Difficult intubation    Difficult airway with intubation for ARF 07/22/14 (due to anterior larynx, also had mucous plug)  . Fatigue 12/09/2015  . Glaucoma   . Hearing loss in left ear    hearing aid both ears  . History of pulmonary embolism 1997  . Interstitial emphysema ([place])   . Left shoulder pain 12/09/2015  . Multiple allergies   . Neuropathy ([place])   . OA (osteoarthritis)   . Pneumonia 12/15  . Shortness of breath dyspnea   . Stroke [facility])    ??? blind left eye  . Wegener's granulomatosis ([place])    Past Surgical History:  Procedure Laterality Date  . bronchosocpy     pt. states she has had over 10 in last 20 yrs.  Marland Kitchen CATARACT EXTRACTION    . COCHLEAR IMPLANT Left 10/27/2015   Procedure: LEFT COCHLEAR IMPLANT;  Surgeon: Leta Baptist, MD;  Location: [facility];  Service: ENT;  Laterality: Left;  . INGUINAL HERNIA REPAIR Left 02/17/2014   Procedure: LEFT INGUINAL HERNIA REPAIR WITH MESH;  Surgeon: Harl Bowie, MD;  Location: [facility];  Service: General;  Laterality: Left;  . INSERTION OF  MESH N/A 02/17/2014   Procedure: INSERTION OF MESH;  Surgeon: Harl Bowie, MD;  Location: [facility];  Service: General;  Laterality: N/A;  . KNEE ARTHROSCOPY  2006   rt   . MELANOMA EXCISION     left leg  . PUBOVAGINAL SLING    . TEAR DUCT PROBING    . TUBAL LIGATION    . TYMPANOMASTOIDECTOMY Left 04/28/2015  . TYMPANOMASTOIDECTOMY Left 04/28/2015   Procedure: LEFT TYMPANOMASTOIDECTOMY;  Surgeon: Leta Baptist, MD;  Location: [facility];  Service: ENT;  Laterality: Left;  Marland Kitchen VENA  CAVA FILTER PLACEMENT  1997   [place] filter    Allergies  Allergen Reactions  . Adhesive [Tape] Rash    On on lower extremities       Medication List       Accurate as of 02/02/16 11:59 PM. Always use your most recent med list.          acetaminophen 650 MG CR tablet Commonly known as:  TYLENOL Take 650 mg by mouth 2 (two) times daily. Every 6 hours as needed for pain   b complex vitamins capsule Take 1 capsule by mouth daily.   calcium-vitamin D 500-200 MG-UNIT tablet Commonly known as:  OSCAL WITH D Take 1 tablet by mouth daily.   cholecalciferol 1000 units tablet Commonly known as:  VITAMIN D One daily for vitamin D supplement   ferrous sulfate 325 (65 FE) MG tablet Take 325 mg by mouth 2 (two) times daily with a meal.   fluticasone 50 MCG/ACT nasal spray Commonly known as:  FLONASE Place 1 spray into both nostrils every evening.   Fluticasone-Salmeterol 500-50 MCG/DOSE Aepb Commonly known as:  ADVAIR DISKUS INHALE 1 PUFF EVERY 12 HOURS.   gabapentin 300 MG capsule Commonly known as:  NEURONTIN Take 300 mg by mouth 3 (three) times daily.   guaiFENesin 600 MG 12 hr tablet Commonly known as:  MUCINEX Take 600 mg by mouth 2 (two) times daily.   latanoprost 0.005 % ophthalmic solution Commonly known as:  XALATAN Place 1 drop into both eyes at bedtime.   levalbuterol 0.63 MG/3ML nebulizer solution Commonly known as:  XOPENEX Take 0.63 mg by nebulization every 6 (six) hours as needed for wheezing or shortness of breath.   multivitamin with minerals tablet Take 1 tablet by mouth daily.   omeprazole 20 MG capsule Commonly known as:  PRILOSEC Take 20 mg by mouth daily.   saccharomyces boulardii 250 MG capsule Commonly known as:  FLORASTOR Take 250 mg by mouth 2 (two) times daily.   SORE THROAT SPRAY 1.4 % Liqd Generic drug:  phenol Use as directed 1 spray in the mouth or throat as needed for throat irritation / pain.     sulfamethoxazole-trimethoprim 800-160 MG tablet Commonly known as:  BACTRIM DS,SEPTRA DS Take 1 tablet by mouth every Monday, Wednesday, and Friday. HOLD WHILE ON AUGMENTIN, THEN RESUME AS PREVIOUS   timolol 0.5 % ophthalmic solution Commonly known as:  BETIMOL Place 1 drop into the left eye daily with breakfast.   warfarin 4 MG tablet Commonly known as:  COUMADIN Take 4 mg by mouth daily.       Review of Systems  Constitutional: Negative for chills, diaphoresis and fever.       Frail and generally weak.  HENT: Positive for hearing loss (Deaf on left, hearing aid on right ) and nosebleeds. Negative for congestion, ear discharge, ear pain and tinnitus.  Left cochlear implant pending.  Eyes: Negative.  Negative for pain and redness.  Respiratory: Positive for cough and shortness of breath. Negative for wheezing (Intermittent).        History Wegener's granulomatosis. History of chronic sinusitis related to her Wegener's. Chronic cough and yellowish phlegm production   Cardiovascular: Positive for leg swelling. Negative for chest pain and palpitations.       Trace R+L foot/ankle, R>L  Gastrointestinal: Negative for abdominal pain, constipation, diarrhea and nausea.       History of GERD.  Endocrine: Negative for polydipsia.  Genitourinary: Positive for frequency. Negative for dysuria, flank pain, hematuria and urgency.       Frequency induced by furosemide. Episodes of incontinence of urine.  Musculoskeletal: Positive for arthralgias and gait problem. Negative for back pain, myalgias and neck pain.       Generalized weakness Left upper extremity pain, limited ROM of the left shoulder  Skin: Negative.  Negative for rash.       Left ear cellulitis, taking PCN, not as painful as prior, but entire left ear redness, swelling, and heat persisted.     Neurological: Positive for weakness. Negative for dizziness, tremors, seizures and headaches.       Neuropathic leg/foot pain.  Generalized weakness BLE  Hematological: Does not bruise/bleed easily.       Chronic anemia.  Psychiatric/Behavioral: Negative for hallucinations and suicidal ideas. The patient is nervous/anxious.        Recently her husband has been admitted to SNF    Immunization History  Administered Date(s) Administered  . Influenza Split 03/04/2012, 03/11/2014, 04/01/2015  . Influenza,inj,Quad PF,36+ Mos 03/03/2013  . Influenza-Unspecified 04/01/2015  . PPD Test 05/25/2015  . Pneumococcal Conjugate-13 07/01/2015  . Pneumococcal Polysaccharide-23 09/02/2015  . Zoster 09/30/1994   Pertinent  Health Maintenance Due  Topic Date Due  . INFLUENZA VACCINE  02/01/2016  . DEXA SCAN  Completed  . PNA vac Low Risk Adult  Completed   Fall Risk  12/09/2015 09/23/2015 09/09/2015 02/03/2015 12/04/2014  Falls in the past year? No Yes Yes Yes Yes  Number falls in past yr: - 2 or more 2 or more 2 or more 2 or more  Injury with Fall? - No No - No   Functional Status Survey:    Vitals:   02/02/16 1058  BP: 133/76  Pulse: 76  Resp: (!) 21  Temp: 98.2 F (36.8 C)  Weight: 137 lb 8 oz (62.4 kg)  Height: 5\' 1"  (1.549 m)   Body mass index is 25.98 kg/m. Physical Exam  Constitutional: She is oriented to person, place, and time. She appears well-developed. No distress.  Thin and frail. Weight stable  since 3/16  HENT:  Head: Normocephalic and atraumatic.  Nose: Nose normal.  Mouth/Throat: Oropharynx is clear and moist. No oropharyngeal exudate.  Bilateral hearing loss. Hearing aid - right ear Pending left cochlear implant  Eyes: Conjunctivae and EOM are normal. Pupils are equal, round, and reactive to light. Right eye exhibits no discharge. Left eye exhibits no discharge. No scleral icterus.  Neck: Normal range of motion. Neck supple. No JVD present. No tracheal deviation present. No thyromegaly present.  Cardiovascular: Normal rate, regular rhythm, normal heart sounds and intact distal pulses.   No murmur  heard. Pulmonary/Chest: Effort normal. No stridor. No respiratory distress. She has no wheezes. She has rales. She exhibits no tenderness.  Abdominal: Soft. Bowel sounds are normal. She exhibits no distension and no mass. There is no tenderness. There  is no rebound and no guarding.  Musculoskeletal: Normal range of motion. She exhibits edema (trace) and tenderness.  R+L foot/ankle, R>L, trace to 1+ Left upper extremity pain. L shoulder limited ROM  Lymphadenopathy:    She has no cervical adenopathy.  Neurological: She is alert and oriented to person, place, and time. She has normal reflexes. No cranial nerve deficit. She exhibits normal muscle tone. Coordination normal.  The patient has history of neuropathy of bilateral lower extremities which resulted in generalized weakness and pain of legs, she is only able to ambulates a few steps with walker in her room and depend on w/c to go further. In order to maintain her quality of life and mobility, she needs wheelchair to travel to dinning room for meals and attend activities on the [place] where she resides. Due to her left upper extremity pain related to an injury occurred 4-5 years, she is unable to propel manual wheelchair or steer a scooter in her living space to maintain her mobility. Mrs Stange manages her own affairs and finances with minimal assistance. She has worked with therapy in the past and has been deemed to be safe to operate a power chair.    Skin: Skin is warm and dry. No rash noted. She is not diaphoretic. No erythema. No pallor.  Left ear cellulitis, taking PCN, not as painful as prior, but entire left ear redness, swelling, and heat persisted.     Psychiatric: She has a normal mood and affect. Her behavior is normal. Judgment and thought content normal.  Emotional when voice her worry not able to walk to SNF to visit her busband.     Labs reviewed:  Recent Labs  04/20/15 1409  09/15/15 09/15/15 1342 11/26/15    NA 140  < > 141 141 137  K 4.2  < > 4.1 4.1 4.1  CL 106  --   --  109  --   CO2 28  --   --  23  --   GLUCOSE 104*  --   --  97  --   BUN 12  < > 13 13 13   CREATININE 0.79  < > 0.9 0.87 0.8  CALCIUM 10.0  --   --  9.9  --   < > = values in this interval not displayed.  Recent Labs  07/25/15 08/12/15 11/26/15  AST 22 21 33  ALT 14 15 20   ALKPHOS 178* 102 171*    Recent Labs  02/03/15 1335  09/23/15 0908  10/27/15 0711 11/26/15 12/02/15 1327  WBC 5.1  < > 3.6*  < > 4.4 11.5 5.3  NEUTROABS 3.4  --  2.1  --   --   --  3.3  HGB 12.3  < > 9.9*  < > 10.7* 12.3 12.1  HCT 37.0  < > 32.2*  < > 34.2* 38 37.0  MCV 96  < > 80*  --  84.0  --  88  PLT 302  < > 449*  < > 324 280 303  < > = values in this interval not displayed. Lab Results  Component Value Date   TSH 1.56 05/13/2015   Lab Results  Component Value Date   HGBA1C 5.8 (H) 07/28/2014   No results found for: CHOL, HDL, LDLCALC, LDLDIRECT, TRIG, CHOLHDL  Significant Diagnostic Results in last 30 days:  No results found.  Assessment/Plan There are no diagnoses linked to this encounter.WEGENERS GRANULOMATOSIS 11/26/15 CXR mild patchy density in RUL  compatible with pneumonia. 11/27/15 10 day course of Avelox. Chronic cough and clear phlegm production  Constipation Stable, continue Miralax.    GERD (gastroesophageal reflux disease) Stable, continue Omeprazole 20mg  daily   Peripheral neuropathy (HCC) Lower extremities weakness, motorized w/c for mobility, continue Neurontin  Anemia of chronic disease 10/05/15 Hgb 11.1 11/26/15 Hgb 12.3 Continue Fe, update CBC  Edema Off diuretics. Only trace edema in ankles-no longer needing TED. 11/26/15 Na 137, K 4.1, Bun 13, creat 0.82. Update CMP       Family/ staff Communication: continue AL for care assistance  Labs/tests ordered: CBC, CMP

## 2016-02-03 NOTE — Assessment & Plan Note (Addendum)
Lower extremities weakness, motorized w/c for mobility, continue Neurontin   

## 2016-02-03 NOTE — Assessment & Plan Note (Signed)
Stable, continue Omeprazole 20mg daily.  

## 2016-02-03 NOTE — Assessment & Plan Note (Signed)
11/26/15 CXR mild patchy density in RUL compatible with pneumonia. 11/27/15 10 day course of Avelox. Chronic cough and clear phlegm production

## 2016-02-03 NOTE — Assessment & Plan Note (Signed)
Stable, continue Miralax.  

## 2016-02-03 NOTE — Assessment & Plan Note (Signed)
10/05/15 Hgb 11.1 11/26/15 Hgb 12.3 Continue Fe, update CBC

## 2016-02-03 NOTE — Assessment & Plan Note (Signed)
Off diuretics. Only trace edema in ankles-no longer needing TED. 11/26/15 Na 137, K 4.1, Bun 13, creat 0.82. Update CMP

## 2016-02-08 LAB — BASIC METABOLIC PANEL
BUN: 10 mg/dL (ref 4–21)
Creatinine: 0.8 mg/dL (ref <=1.1)
GLUCOSE: 87 mg/dL
Potassium: 4.1 mmol/L (ref 3.4–5.3)
Sodium: 141 mmol/L (ref 137–147)

## 2016-02-08 LAB — CBC AND DIFFERENTIAL
HEMATOCRIT: 37 % (ref 36–46)
Hemoglobin: 11.7 g/dL — AB (ref 12.0–16.0)
PLATELETS: 256 10*3/uL (ref 150–399)
WBC: 4 10^3/mL

## 2016-02-08 LAB — HEPATIC FUNCTION PANEL
ALK PHOS: 127 U/L — AB (ref 25–125)
ALT: 17 U/L (ref 7–35)
AST: 23 U/L (ref 13–35)
BILIRUBIN, TOTAL: 0.4 mg/dL

## 2016-02-09 ENCOUNTER — Other Ambulatory Visit: Payer: Self-pay | Admitting: *Deleted

## 2016-03-02 ENCOUNTER — Encounter: Payer: Self-pay | Admitting: Internal Medicine

## 2016-03-09 ENCOUNTER — Non-Acute Institutional Stay: Payer: Medicare Other | Admitting: Internal Medicine

## 2016-03-09 ENCOUNTER — Encounter: Payer: Self-pay | Admitting: Internal Medicine

## 2016-03-09 DIAGNOSIS — D62 Acute posthemorrhagic anemia: Secondary | ICD-10-CM

## 2016-03-09 DIAGNOSIS — H109 Unspecified conjunctivitis: Secondary | ICD-10-CM | POA: Insufficient documentation

## 2016-03-09 LAB — POCT INR: INR: 2.3 — AB (ref 0.9–1.1)

## 2016-03-09 MED ORDER — CIPROFLOXACIN HCL 0.3 % OP SOLN: OPHTHALMIC | 0 refills | Status: DC

## 2016-03-09 NOTE — Progress Notes (Signed)
Progress Note    Location:   [facility] Room Number: [id number] Place of Service:  ALF [phone]) Provider:  Jeanmarie Hubert, MD  Patient Care Team: Estill Dooms, MD as PCP - General (Internal Medicine) Unice Bailey, MD as Consulting Physician (Rheumatology) Rozetta Nunnery, MD as Consulting Physician (Otolaryngology) Marcial Pacas, MD as Consulting Physician (Neurology) Michel Bickers, MD as Consulting Physician (Infectious Diseases) Volanda Napoleon, MD as Consulting Physician (Oncology) Juanito Doom, MD as Consulting Physician (Pulmonary Disease) Tanda Rockers, MD as Consulting Physician (Pulmonary Disease) Estill Dooms, MD as Consulting Physician (Geriatric Medicine) Coralie Keens, MD as Consulting Physician (General Surgery) Man Otho Darner, NP as Nurse Practitioner (Nurse Practitioner)  Extended Emergency Contact Information Primary Emergency Contact: Haygood,Sandra Address: [address] [place] [address] Johnnette Litter of [place] Phone: [phone] Relation: Daughter Secondary Emergency Contact: Mcquarrie,[name] Address: [address] [place]          [place], [place] [address] [place] Phone: [phone] Mobile Phone: [phone] Relation: Son  Code Status:  No Code Goals of care: Advanced Directive information Advanced Directives 03/09/2016  Does patient have an advance directive? Yes  Type of Paramedic of [place];Living will  Does patient want to make changes to advanced directive? -  Copy of advanced directive(s) in chart? Yes  Would patient like information on creating an advanced directive? -     Chief Complaint  Patient presents with  . Acute Visit    left eye red and drains     HPI:  Pt is a 82 y.o. female seen today for an acute visit for    Past Medical History:  Diagnosis Date  . Acute respiratory failure with hypoxia ([place])   . Anemia   . Aspergillosis  ([place])   . Bladder incontinence   . Blindness of one eye   . Cancer ([place])    melanoma   ([place])  . Chronic sinusitis   . Clotting disorder ([place])   . Difficult intubation    Difficult airway with intubation for ARF 07/22/14 (due to anterior larynx, also had mucous plug)  . Fatigue 12/09/2015  . Glaucoma   . Hearing loss in left ear    hearing aid both ears  . History of pulmonary embolism 1997  . Interstitial emphysema ([place])   . Left shoulder pain 12/09/2015  . Multiple allergies   . Neuropathy ([place])   . OA (osteoarthritis)   . Pneumonia 12/15  . Shortness of breath dyspnea   . Stroke [facility])    ??? blind left eye  . Wegener's granulomatosis ([place])    Past Surgical History:  Procedure Laterality Date  . bronchosocpy     pt. states she has had over 10 in last 20 yrs.  Marland Kitchen CATARACT EXTRACTION    . COCHLEAR IMPLANT Left 10/27/2015   Procedure: LEFT COCHLEAR IMPLANT;  Surgeon: Leta Baptist, MD;  Location: [facility];  Service: ENT;  Laterality: Left;  . INGUINAL HERNIA REPAIR Left 02/17/2014   Procedure: LEFT INGUINAL HERNIA REPAIR WITH MESH;  Surgeon: Harl Bowie, MD;  Location: [facility];  Service: General;  Laterality: Left;  . INSERTION OF MESH N/A 02/17/2014   Procedure: INSERTION OF MESH;  Surgeon: Harl Bowie, MD;  Location: [facility];  Service: General;  Laterality: N/A;  . KNEE ARTHROSCOPY  2006   rt   .  MELANOMA EXCISION     left leg  . PUBOVAGINAL SLING    . TEAR DUCT PROBING    . TUBAL LIGATION    . TYMPANOMASTOIDECTOMY Left 04/28/2015  . TYMPANOMASTOIDECTOMY Left 04/28/2015   Procedure: LEFT TYMPANOMASTOIDECTOMY;  Surgeon: Leta Baptist, MD;  Location: [facility];  Service: ENT;  Laterality: Left;  Marland Kitchen VENA CAVA FILTER PLACEMENT  1997   [place] filter    Allergies  Allergen Reactions  . Adhesive [Tape] Rash    On on lower extremities       Medication List       Accurate as of 03/09/16 12:30 PM. Always use your most recent med list.            acetaminophen 650 MG CR tablet Commonly known as:  TYLENOL Take 650 mg by mouth 2 (two) times daily. Every 6 hours as needed for pain   b complex vitamins capsule Take 1 capsule by mouth daily.   calcium-vitamin D 500-200 MG-UNIT tablet Commonly known as:  OSCAL WITH D Take 1 tablet by mouth daily.   cholecalciferol 1000 units tablet Commonly known as:  VITAMIN D One daily for vitamin D supplement   ferrous sulfate 325 (65 FE) MG tablet Take 325 mg by mouth 2 (two) times daily with a meal.   fluticasone 50 MCG/ACT nasal spray Commonly known as:  FLONASE Place 1 spray into both nostrils every evening.   Fluticasone-Salmeterol 500-50 MCG/DOSE Aepb Commonly known as:  ADVAIR DISKUS INHALE 1 PUFF EVERY 12 HOURS.   gabapentin 300 MG capsule Commonly known as:  NEURONTIN Take 300 mg by mouth 3 (three) times daily.   guaiFENesin 600 MG 12 hr tablet Commonly known as:  MUCINEX Take 600 mg by mouth 2 (two) times daily.   latanoprost 0.005 % ophthalmic solution Commonly known as:  XALATAN Place 1 drop into both eyes at bedtime.   levalbuterol 0.63 MG/3ML nebulizer solution Commonly known as:  XOPENEX Take 0.63 mg by nebulization every 6 (six) hours as needed for wheezing or shortness of breath.   omeprazole 20 MG capsule Commonly known as:  PRILOSEC Take 20 mg by mouth daily.   PREPARATION H EX Apply topically. Apply ointment for hemorrhoids four times a day as needed for discomfort   saccharomyces boulardii 250 MG capsule Commonly known as:  FLORASTOR Take 250 mg by mouth 2 (two) times daily.   SORE THROAT SPRAY 1.4 % Liqd Generic drug:  phenol Use as directed 1 spray in the mouth or throat as needed for throat irritation / pain.   sulfamethoxazole-trimethoprim 800-160 MG tablet Commonly known as:  BACTRIM DS,SEPTRA DS Take 1 tablet by mouth every Monday, Wednesday, and Friday. HOLD WHILE ON AUGMENTIN, THEN RESUME AS PREVIOUS   timolol 0.5 % ophthalmic  solution Commonly known as:  BETIMOL Place 1 drop into the left eye daily with breakfast.   warfarin 4 MG tablet Commonly known as:  COUMADIN Take 4 mg by mouth daily.       Review of Systems  Constitutional: Negative for chills, diaphoresis and fever.       Frail and generally weak.  HENT: Positive for hearing loss (Deaf on left, hearing aid on right ) and nosebleeds. Negative for congestion, ear discharge, ear pain and tinnitus.        Left cochlear implant pending.  Eyes: Positive for discharge (OS) and redness (OS). Negative for pain.  Respiratory: Positive for cough and shortness of breath. Negative for wheezing (Intermittent).  History Wegener's granulomatosis. History of chronic sinusitis related to her Wegener's. Chronic cough and yellowish phlegm production   Cardiovascular: Positive for leg swelling. Negative for chest pain and palpitations.       Trace R+L foot/ankle, R>L  Gastrointestinal: Negative for abdominal pain, constipation, diarrhea and nausea.       History of GERD.  Endocrine: Negative for polydipsia.  Genitourinary: Positive for frequency. Negative for dysuria, flank pain, hematuria and urgency.       Frequency induced by furosemide. Episodes of incontinence of urine.  Musculoskeletal: Positive for arthralgias and gait problem. Negative for back pain, myalgias and neck pain.       Generalized weakness Left upper extremity pain, limited ROM of the left shoulder  Skin: Negative for rash.  Neurological: Positive for weakness. Negative for dizziness, tremors, seizures and headaches.       Neuropathic leg/foot pain. Generalized weakness BLE  Hematological: Does not bruise/bleed easily.       Chronic anemia.  Psychiatric/Behavioral: Negative for hallucinations and suicidal ideas. The patient is nervous/anxious.        Recently her husband has been admitted to SNF    Immunization History  Administered Date(s) Administered  . Influenza Split 03/04/2012,  03/11/2014, 04/01/2015  . Influenza,inj,Quad PF,36+ Mos 03/03/2013  . Influenza-Unspecified 04/01/2015  . PPD Test 05/25/2015  . Pneumococcal Conjugate-13 07/01/2015  . Pneumococcal Polysaccharide-23 09/02/2015  . Zoster 09/30/1994   Pertinent  Health Maintenance Due  Topic Date Due  . INFLUENZA VACCINE  02/01/2016  . DEXA SCAN  Completed  . PNA vac Low Risk Adult  Completed   Fall Risk  12/09/2015 09/23/2015 09/09/2015 02/03/2015 12/04/2014  Falls in the past year? No Yes Yes Yes Yes  Number falls in past yr: - 2 or more 2 or more 2 or more 2 or more  Injury with Fall? - No No - No   Functional Status Survey:    Vitals:   03/09/16 1228  BP: 132/78  Pulse: 76  Resp: 18  Temp: 98.3 F (36.8 C)  Weight: 137 lb (62.1 kg)  Height: 5\' 1"  (1.549 m)   Body mass index is 25.89 kg/m. Physical Exam  Constitutional: She is oriented to person, place, and time. She appears well-developed. No distress.  Thin and frail. Weight stable  since 3/16  HENT:  Head: Normocephalic and atraumatic.  Nose: Nose normal.  Mouth/Throat: Oropharynx is clear and moist. No oropharyngeal exudate.  Bilateral hearing loss. Hearing aid - right ear Pending left cochlear implant  Eyes: EOM are normal. Pupils are equal, round, and reactive to light. Right eye exhibits no discharge. Left eye exhibits no discharge. No scleral icterus.  Left conjunctiva quite red and bloodshot. There is a whitish exudate as well.  Neck: Normal range of motion. Neck supple. No JVD present. No tracheal deviation present. No thyromegaly present.  Cardiovascular: Normal rate, regular rhythm, normal heart sounds and intact distal pulses.   No murmur heard. Pulmonary/Chest: Effort normal. No stridor. No respiratory distress. She has no wheezes. She has rales. She exhibits no tenderness.  Abdominal: Soft. Bowel sounds are normal. She exhibits no distension and no mass. There is no tenderness. There is no rebound and no guarding.    Musculoskeletal: Normal range of motion. She exhibits edema (trace) and tenderness.  R+L foot/ankle, R>L, trace to 1+ Left upper extremity pain. L shoulder limited ROM  Lymphadenopathy:    She has no cervical adenopathy.  Neurological: She is alert and oriented to person, place,  and time. She has normal reflexes. No cranial nerve deficit. She exhibits normal muscle tone. Coordination normal.  The patient has history of neuropathy of bilateral lower extremities which resulted in generalized weakness and pain of legs, she is only able to ambulates a few steps with walker in her room and depend on w/c to go further. In order to maintain her quality of life and mobility, she needs wheelchair to travel to dinning room for meals and attend activities on the [place] where she resides. Due to her left upper extremity pain related to an injury occurred 4-5 years, she is unable to propel manual wheelchair or steer a scooter in her living space to maintain her mobility. Mrs Bilbro manages her own affairs and finances with minimal assistance. She has worked with therapy in the past and has been deemed to be safe to operate a power chair.    Skin: Skin is warm and dry. No rash noted. She is not diaphoretic. No erythema. No pallor.  Psychiatric: She has a normal mood and affect. Her behavior is normal. Judgment and thought content normal.  Emotional when voicing her worry she will not be able to get to SNF to visit her busband.     Labs reviewed:  Recent Labs  04/20/15 1409  09/15/15 1342 11/26/15 02/08/16  NA 140  < > 141 137 141  K 4.2  < > 4.1 4.1 4.1  CL 106  --  109  --   --   CO2 28  --  23  --   --   GLUCOSE 104*  --  97  --   --   BUN 12  < > 13 13 10   CREATININE 0.79  < > 0.87 0.8 0.8  CALCIUM 10.0  --  9.9  --   --   < > = values in this interval not displayed.  Recent Labs  08/12/15 11/26/15 02/08/16  AST 21 33 23  ALT 15 20 17   ALKPHOS 102 171* 127*    Recent Labs   09/23/15 0908  10/27/15 0711 11/26/15 12/02/15 1327 02/08/16  WBC 3.6*  < > 4.4 11.5 5.3 4.0  NEUTROABS 2.1  --   --   --  3.3  --   HGB 9.9*  < > 10.7* 12.3 12.1 11.7*  HCT 32.2*  < > 34.2* 38 37.0 37  MCV 80*  --  84.0  --  88  --   PLT 449*  < > 324 280 303 256  < > = values in this interval not displayed. Lab Results  Component Value Date   TSH 1.56 05/13/2015   Lab Results  Component Value Date   HGBA1C 5.8 (H) 07/28/2014    Assessment/Plan 1. Conjunctivitis of left eye - ciprofloxacin (CILOXAN) 0.3 % ophthalmic solution; 1-2 drops in the left eye AC and HS for 7 days  Dispense: 5 mL; Refill: 0  2. Acute blood loss anemia Resolved. Stop iron

## 2016-03-13 ENCOUNTER — Non-Acute Institutional Stay: Payer: Medicare Other | Admitting: Nurse Practitioner

## 2016-03-13 ENCOUNTER — Encounter: Payer: Self-pay | Admitting: Nurse Practitioner

## 2016-03-13 DIAGNOSIS — H109 Unspecified conjunctivitis: Secondary | ICD-10-CM | POA: Diagnosis not present

## 2016-03-13 DIAGNOSIS — K5900 Constipation, unspecified: Secondary | ICD-10-CM

## 2016-03-13 DIAGNOSIS — F411 Generalized anxiety disorder: Secondary | ICD-10-CM | POA: Diagnosis not present

## 2016-03-13 DIAGNOSIS — M3130 Wegener's granulomatosis without renal involvement: Secondary | ICD-10-CM | POA: Diagnosis not present

## 2016-03-13 DIAGNOSIS — R609 Edema, unspecified: Secondary | ICD-10-CM | POA: Diagnosis not present

## 2016-03-13 DIAGNOSIS — G609 Hereditary and idiopathic neuropathy, unspecified: Secondary | ICD-10-CM | POA: Diagnosis not present

## 2016-03-13 DIAGNOSIS — D638 Anemia in other chronic diseases classified elsewhere: Secondary | ICD-10-CM

## 2016-03-13 DIAGNOSIS — K219 Gastro-esophageal reflux disease without esophagitis: Secondary | ICD-10-CM

## 2016-03-13 NOTE — Assessment & Plan Note (Signed)
Lower extremities weakness, motorized w/c for mobility, continue Neurontin   

## 2016-03-13 NOTE — Assessment & Plan Note (Signed)
10/05/15 Hgb 11.1 11/26/15 Hgb 12.3 02/08/16 Hgb 11.7 Continue Fe

## 2016-03-13 NOTE — Assessment & Plan Note (Signed)
Off diuretics. Only trace edema in ankles-no longer needing TED. 02/08/16 Na 141, K 4.1, Bun 10, creat 0.78

## 2016-03-13 NOTE — Assessment & Plan Note (Signed)
Stable, continue Omeprazole 20mg daily.  

## 2016-03-13 NOTE — Assessment & Plan Note (Signed)
Prn Lorazepam is adequate for her

## 2016-03-13 NOTE — Assessment & Plan Note (Signed)
Acute visit 03/13/16 injected left eye, not responding to Cipro ophthalmic sol, pending ophthalmology 03/15/16, mild irritation, stringy discharge, baseline vision is lateral peripheral vision only, denied eye pain. Apply Naphcon tid, Ophthalmology 03/15/16

## 2016-03-13 NOTE — Assessment & Plan Note (Signed)
Stable, continue Miralax.  

## 2016-03-13 NOTE — Assessment & Plan Note (Signed)
11/26/15 CXR mild patchy density in RUL compatible with pneumonia. 11/27/15 10 day course of Avelox. Chronic cough and clear phlegm production

## 2016-03-13 NOTE — Progress Notes (Signed)
Location:   [facility] Room Number: [place] of Service:  ALF (13) Provider:  Mast, Man  NP   Patient Care Team: Estill Dooms, MD as PCP - General (Internal Medicine) Unice Bailey, MD as Consulting Physician (Rheumatology) Rozetta Nunnery, MD as Consulting Physician (Otolaryngology) Marcial Pacas, MD as Consulting Physician (Neurology) Michel Bickers, MD as Consulting Physician (Infectious Diseases) Volanda Napoleon, MD as Consulting Physician (Oncology) Juanito Doom, MD as Consulting Physician (Pulmonary Disease) Tanda Rockers, MD as Consulting Physician (Pulmonary Disease) Estill Dooms, MD as Consulting Physician (Geriatric Medicine) Coralie Keens, MD as Consulting Physician (General Surgery) Man Otho Darner, NP as Nurse Practitioner (Nurse Practitioner)  Extended Emergency Contact Information Primary Emergency Contact: Haygood,Sandra Address: [address] [place] [address] Johnnette Litter of [place] Phone: [phone] Relation: Daughter Secondary Emergency Contact: Cherian,[name] Address: [address] [place]          [place], [place] [address] [place] of [place] Phone: [phone] Mobile Phone: [phone] Relation: Son  Code Status:  Full Code Goals of care: Advanced Directive information Advanced Directives 03/13/2016  Does patient have an advance directive? Yes  Type of Paramedic of [place];Living will  Does patient want to make changes to advanced directive? No - Patient declined  Copy of advanced directive(s) in chart? Yes  Would patient like information on creating an advanced directive? -     Chief Complaint  Patient presents with  . Acute Visit    Left eye still red, drainage. ? stye    HPI:  Pt is a 82 y.o. female seen today for medical management of chronic diseases.     Acute visit 03/13/16 injected left eye, not responding to Cipro ophthalmic sol,  pending ophthalmology 03/15/16, mild irritation, stringy discharge, baseline vision is lateral peripheral vision only, denied eye pain.    Hx of Anemia, Hgb improved to 12.1 12/02/15, taking Fe, iron level improved from 29 to 65,  workup and tx under Hematology. Peripheral neuropathy, w/c for mobility, takes Gabapentin 300mg  tid. GERD symptomatic free on Omeprazole 20mg  daily.   Past Medical History:  Diagnosis Date  . Acute respiratory failure with hypoxia ([place])   . Anemia   . Aspergillosis ([place])   . Bladder incontinence   . Blindness of one eye   . Cancer ([place])    melanoma   (chemo)  . Chronic sinusitis   . Clotting disorder ([place])   . Difficult intubation    Difficult airway with intubation for ARF 07/22/14 (due to anterior larynx, also had mucous plug)  . Fatigue 12/09/2015  . Glaucoma   . Hearing loss in left ear    hearing aid both ears  . History of pulmonary embolism 1997  . Interstitial emphysema ([place])   . Left shoulder pain 12/09/2015  . Multiple allergies   . Neuropathy ([place])   . OA (osteoarthritis)   . Pneumonia 12/15  . Shortness of breath dyspnea   . Stroke [facility])    ??? blind left eye  . Wegener's granulomatosis ([place])    Past Surgical History:  Procedure Laterality Date  . bronchosocpy     pt. states she has had over 10 in last 20 yrs.  Marland Kitchen CATARACT EXTRACTION    . COCHLEAR IMPLANT Left 10/27/2015   Procedure: LEFT COCHLEAR IMPLANT;  Surgeon: Leta Baptist, MD;  Location: ;  Service: ENT;  Laterality: Left;  . INGUINAL HERNIA REPAIR Left 02/17/2014   Procedure: LEFT INGUINAL HERNIA REPAIR WITH MESH;  Surgeon: Harl Bowie, MD;  Location: [facility];  Service: General;  Laterality: Left;  . INSERTION OF MESH N/A 02/17/2014   Procedure: INSERTION OF MESH;  Surgeon: Harl Bowie, MD;  Location: [facility];  Service: General;  Laterality: N/A;  . KNEE ARTHROSCOPY  2006   rt   . MELANOMA EXCISION     left leg  . PUBOVAGINAL SLING      . TEAR DUCT PROBING    . TUBAL LIGATION    . TYMPANOMASTOIDECTOMY Left 04/28/2015  . TYMPANOMASTOIDECTOMY Left 04/28/2015   Procedure: LEFT TYMPANOMASTOIDECTOMY;  Surgeon: Leta Baptist, MD;  Location: [facility];  Service: ENT;  Laterality: Left;  Marland Kitchen VENA CAVA FILTER PLACEMENT  1997   [place] filter    Allergies  Allergen Reactions  . Adhesive [Tape] Rash    On on lower extremities       Medication List       Accurate as of 03/13/16  2:52 PM. Always use your most recent med list.          acetaminophen 650 MG CR tablet Commonly known as:  TYLENOL Take 650 mg by mouth 2 (two) times daily. Every 6 hours as needed for pain   b complex vitamins capsule Take 1 capsule by mouth daily.   calcium-vitamin D 500-200 MG-UNIT tablet Commonly known as:  OSCAL WITH D Take 1 tablet by mouth daily.   cholecalciferol 1000 units tablet Commonly known as:  VITAMIN D One daily for vitamin D supplement   ciprofloxacin 0.3 % ophthalmic solution Commonly known as:  CILOXAN 1-2 drops in the left eye AC and HS for 7 days   Fluticasone-Salmeterol 500-50 MCG/DOSE Aepb Commonly known as:  ADVAIR DISKUS INHALE 1 PUFF EVERY 12 HOURS.   gabapentin 300 MG capsule Commonly known as:  NEURONTIN Take 300 mg by mouth 3 (three) times daily.   guaiFENesin 600 MG 12 hr tablet Commonly known as:  MUCINEX Take 600 mg by mouth 2 (two) times daily.   latanoprost 0.005 % ophthalmic solution Commonly known as:  XALATAN Place 1 drop into both eyes at bedtime.   levalbuterol 0.63 MG/3ML nebulizer solution Commonly known as:  XOPENEX Take 0.63 mg by nebulization every 6 (six) hours as needed for wheezing or shortness of breath.   omeprazole 20 MG capsule Commonly known as:  PRILOSEC Take 20 mg by mouth daily.   PREPARATION H EX Apply topically. Apply ointment for hemorrhoids four times a day as needed for discomfort   SORE THROAT SPRAY 1.4 % Liqd Generic drug:  phenol Use as directed 1 spray in the  mouth or throat as needed for throat irritation / pain.   sulfamethoxazole-trimethoprim 800-160 MG tablet Commonly known as:  BACTRIM DS,SEPTRA DS Take 1 tablet by mouth every Monday, Wednesday, and Friday. HOLD WHILE ON AUGMENTIN, THEN RESUME AS PREVIOUS   timolol 0.5 % ophthalmic solution Commonly known as:  BETIMOL Place 1 drop into the left eye daily with breakfast.   warfarin 4 MG tablet Commonly known as:  COUMADIN Take 4 mg by mouth daily.       Review of Systems  Constitutional: Negative for chills, diaphoresis and fever.       Frail and generally weak.  HENT: Positive for hearing loss (Deaf on left, hearing aid on right ) and nosebleeds. Negative for congestion, ear discharge, ear  pain and tinnitus.        Left cochlear implant pending.  Eyes: Positive for redness. Negative for pain.       Acute visit 03/13/16 injected left eye, not responding to Cipro ophthalmic sol, pending ophthalmology 03/15/16, mild irritation, stringy discharge, baseline vision is lateral peripheral vision only, denied eye pain.   Respiratory: Positive for cough and shortness of breath. Negative for wheezing (Intermittent).        History Wegener's granulomatosis. History of chronic sinusitis related to her Wegener's. Chronic cough and yellowish phlegm production   Cardiovascular: Positive for leg swelling. Negative for chest pain and palpitations.       Trace R+L foot/ankle, R>L  Gastrointestinal: Negative for abdominal pain, constipation, diarrhea and nausea.       History of GERD.  Endocrine: Negative for polydipsia.  Genitourinary: Positive for frequency. Negative for dysuria, flank pain, hematuria and urgency.       Frequency induced by furosemide. Episodes of incontinence of urine.  Musculoskeletal: Positive for arthralgias and gait problem. Negative for back pain, myalgias and neck pain.       Generalized weakness Left upper extremity pain, limited ROM of the left shoulder  Skin: Negative.   Negative for rash.       Left ear cellulitis, taking PCN, not as painful as prior, but entire left ear redness, swelling, and heat persisted.     Neurological: Positive for weakness. Negative for dizziness, tremors, seizures and headaches.       Neuropathic leg/foot pain. Generalized weakness BLE  Hematological: Does not bruise/bleed easily.       Chronic anemia.  Psychiatric/Behavioral: Negative for hallucinations and suicidal ideas. The patient is nervous/anxious.        Recently her husband has been admitted to SNF    Immunization History  Administered Date(s) Administered  . Influenza Split 03/04/2012, 03/11/2014, 04/01/2015  . Influenza,inj,Quad PF,36+ Mos 03/03/2013  . Influenza-Unspecified 04/01/2015  . PPD Test 05/25/2015  . Pneumococcal Conjugate-13 07/01/2015  . Pneumococcal Polysaccharide-23 09/02/2015  . Zoster 09/30/1994   Pertinent  Health Maintenance Due  Topic Date Due  . INFLUENZA VACCINE  02/01/2016  . DEXA SCAN  Completed  . PNA vac Low Risk Adult  Completed   Fall Risk  12/09/2015 09/23/2015 09/09/2015 02/03/2015 12/04/2014  Falls in the past year? No Yes Yes Yes Yes  Number falls in past yr: - 2 or more 2 or more 2 or more 2 or more  Injury with Fall? - No No - No   Functional Status Survey:    Vitals:   03/13/16 1116  BP: 128/70  Pulse: 72  Resp: 18  Temp: 98 F (36.7 C)  Weight: 137 lb 8 oz (62.4 kg)  Height: 5\' 1"  (1.549 m)   Body mass index is 25.98 kg/m. Physical Exam  Constitutional: She is oriented to person, place, and time. She appears well-developed. No distress.  Thin and frail. Weight stable  since 3/16  HENT:  Head: Normocephalic and atraumatic.  Nose: Nose normal.  Mouth/Throat: Oropharynx is clear and moist. No oropharyngeal exudate.  Bilateral hearing loss. Hearing aid - right ear Pending left cochlear implant  Eyes: EOM are normal. Pupils are equal, round, and reactive to light. Right eye exhibits no discharge. Left eye exhibits  discharge. No scleral icterus.  Acute visit 03/13/16 injected left eye, not responding to Cipro ophthalmic sol, pending ophthalmology 03/15/16, mild irritation, stringy discharge, baseline vision is lateral peripheral vision only, denied eye pain.   Neck:  Normal range of motion. Neck supple. No JVD present. No tracheal deviation present. No thyromegaly present.  Cardiovascular: Normal rate, regular rhythm, normal heart sounds and intact distal pulses.   No murmur heard. Pulmonary/Chest: Effort normal. No stridor. No respiratory distress. She has no wheezes. She has rales. She exhibits no tenderness.  Abdominal: Soft. Bowel sounds are normal. She exhibits no distension and no mass. There is no tenderness. There is no rebound and no guarding.  Musculoskeletal: Normal range of motion. She exhibits edema (trace) and tenderness.  R+L foot/ankle, R>L, trace to 1+ Left upper extremity pain. L shoulder limited ROM  Lymphadenopathy:    She has no cervical adenopathy.  Neurological: She is alert and oriented to person, place, and time. She has normal reflexes. No cranial nerve deficit. She exhibits normal muscle tone. Coordination normal.  The patient has history of neuropathy of bilateral lower extremities which resulted in generalized weakness and pain of legs, she is only able to ambulates a few steps with walker in her room and depend on w/c to go further. In order to maintain her quality of life and mobility, she needs wheelchair to travel to dinning room for meals and attend activities on the [place] where she resides. Due to her left upper extremity pain related to an injury occurred 4-5 years, she is unable to propel manual wheelchair or steer a scooter in her living space to maintain her mobility. Mrs Antell manages her own affairs and finances with minimal assistance. She has worked with therapy in the past and has been deemed to be safe to operate a power chair.    Skin: Skin is warm and  dry. No rash noted. She is not diaphoretic. No erythema. No pallor.  Left ear cellulitis, taking PCN, not as painful as prior, but entire left ear redness, swelling, and heat persisted.     Psychiatric: She has a normal mood and affect. Her behavior is normal. Judgment and thought content normal.  Emotional when voice her worry not able to walk to SNF to visit her busband.     Labs reviewed:  Recent Labs  04/20/15 1409  09/15/15 1342 11/26/15 02/08/16  NA 140  < > 141 137 141  K 4.2  < > 4.1 4.1 4.1  CL 106  --  109  --   --   CO2 28  --  23  --   --   GLUCOSE 104*  --  97  --   --   BUN 12  < > 13 13 10   CREATININE 0.79  < > 0.87 0.8 0.8  CALCIUM 10.0  --  9.9  --   --   < > = values in this interval not displayed.  Recent Labs  08/12/15 11/26/15 02/08/16  AST 21 33 23  ALT 15 20 17   ALKPHOS 102 171* 127*    Recent Labs  09/23/15 0908  10/27/15 0711 11/26/15 12/02/15 1327 02/08/16  WBC 3.6*  < > 4.4 11.5 5.3 4.0  NEUTROABS 2.1  --   --   --  3.3  --   HGB 9.9*  < > 10.7* 12.3 12.1 11.7*  HCT 32.2*  < > 34.2* 38 37.0 37  MCV 80*  --  84.0  --  88  --   PLT 449*  < > 324 280 303 256  < > = values in this interval not displayed. Lab Results  Component Value Date   TSH 1.56 05/13/2015  Lab Results  Component Value Date   HGBA1C 5.8 (H) 07/28/2014   No results found for: CHOL, HDL, LDLCALC, LDLDIRECT, TRIG, CHOLHDL  Significant Diagnostic Results in last 30 days:  No results found.  Assessment/Plan There are no diagnoses linked to this encounter.Conjunctivitis Acute visit 03/13/16 injected left eye, not responding to Cipro ophthalmic sol, pending ophthalmology 03/15/16, mild irritation, stringy discharge, baseline vision is lateral peripheral vision only, denied eye pain. Apply Naphcon tid, Ophthalmology 03/15/16  WEGENERS GRANULOMATOSIS 11/26/15 CXR mild patchy density in RUL compatible with pneumonia. 11/27/15 10 day course of Avelox. Chronic cough and clear  phlegm production   Constipation Stable, continue Miralax.    GERD (gastroesophageal reflux disease) Stable, continue Omeprazole 20mg  daily  Peripheral neuropathy (HCC) Lower extremities weakness, motorized w/c for mobility, continue Neurontin   Anemia of chronic disease 10/05/15 Hgb 11.1 11/26/15 Hgb 12.3 02/08/16 Hgb 11.7 Continue Fe  Edema Off diuretics. Only trace edema in ankles-no longer needing TED. 02/08/16 Na 141, K 4.1, Bun 10, creat 0.78     Generalized anxiety disorder Prn Lorazepam is adequate for her      Family/ staff Communication: continue AL for care assistance  Labs/tests ordered: none

## 2016-04-06 ENCOUNTER — Other Ambulatory Visit: Payer: Self-pay | Admitting: *Deleted

## 2016-04-06 LAB — POCT INR: INR: 2.4 — AB (ref <=1.1)

## 2016-04-06 LAB — PROTIME-INR: PROTIME: 24.8 s — AB (ref 10.0–13.8)

## 2016-04-12 ENCOUNTER — Encounter: Payer: Self-pay | Admitting: Nurse Practitioner

## 2016-04-12 NOTE — Progress Notes (Signed)
Location:  [facility] Room Number: [id number] Place of Service:  ALF [phone]) Provider:  Mast, Manxie  NP  Jeanmarie Hubert, MD  Patient Care Team: Estill Dooms, MD as PCP - General (Internal Medicine) Unice Bailey, MD as Consulting Physician (Rheumatology) Rozetta Nunnery, MD as Consulting Physician (Otolaryngology) Marcial Pacas, MD as Consulting Physician (Neurology) Michel Bickers, MD as Consulting Physician (Infectious Diseases) Volanda Napoleon, MD as Consulting Physician (Oncology) Juanito Doom, MD as Consulting Physician (Pulmonary Disease) Tanda Rockers, MD as Consulting Physician (Pulmonary Disease) Estill Dooms, MD as Consulting Physician (Geriatric Medicine) Coralie Keens, MD as Consulting Physician (General Surgery) Man Otho Darner, NP as Nurse Practitioner (Nurse Practitioner)  Extended Emergency Contact Information Primary Emergency Contact: Haygood,Sandra Address: [address] [place] [address] Johnnette Litter of [place] Phone: [phone] Relation: Daughter Secondary Emergency Contact: Coburn,[name] Address: [address] [place]          [place], [place] [address] [place] of [place] Phone: [phone] Mobile Phone: [phone] Relation: Son  Code Status:  Full Code Goals of care: Advanced Directive information Advanced Directives 04/12/2016  Does patient have an advance directive? Yes  Type of Paramedic of [place];Living will  Does patient want to make changes to advanced directive? No - Patient declined  Copy of advanced directive(s) in chart? Yes  Would patient like information on creating an advanced directive? -     Chief Complaint  Patient presents with  . Medical Management of Chronic Issues    HPI:  Pt is a 82 y.o. female seen today for medical management of chronic diseases.     Past Medical History:  Diagnosis Date  . Acute respiratory failure with hypoxia  ([place])   . Anemia   . Aspergillosis ([place])   . Bladder incontinence   . Blindness of one eye   . Cancer ([place])    melanoma   (chemo)  . Chronic sinusitis   . Clotting disorder ([place])   . Difficult intubation    Difficult airway with intubation for ARF 07/22/14 (due to anterior larynx, also had mucous plug)  . Fatigue 12/09/2015  . Glaucoma   . Hearing loss in left ear    hearing aid both ears  . History of pulmonary embolism 1997  . Interstitial emphysema ([place])   . Left shoulder pain 12/09/2015  . Multiple allergies   . Neuropathy ([place])   . OA (osteoarthritis)   . Pneumonia 12/15  . Shortness of breath dyspnea   . Stroke [facility])    ??? blind left eye  . Wegener's granulomatosis ([place])    Past Surgical History:  Procedure Laterality Date  . bronchosocpy     pt. states she has had over 10 in last 20 yrs.  Marland Kitchen CATARACT EXTRACTION    . COCHLEAR IMPLANT Left 10/27/2015   Procedure: LEFT COCHLEAR IMPLANT;  Surgeon: Leta Baptist, MD;  Location: [facility];  Service: ENT;  Laterality: Left;  . INGUINAL HERNIA REPAIR Left 02/17/2014   Procedure: LEFT INGUINAL HERNIA REPAIR WITH MESH;  Surgeon: Harl Bowie, MD;  Location: [facility];  Service: General;  Laterality: Left;  . INSERTION OF MESH N/A 02/17/2014   Procedure: INSERTION OF MESH;  Surgeon: Harl Bowie, MD;  Location: [facility];  Service: General;  Laterality: N/A;  . KNEE ARTHROSCOPY  2006   rt   .  MELANOMA EXCISION     left leg  . PUBOVAGINAL SLING    . TEAR DUCT PROBING    . TUBAL LIGATION    . TYMPANOMASTOIDECTOMY Left 04/28/2015  . TYMPANOMASTOIDECTOMY Left 04/28/2015   Procedure: LEFT TYMPANOMASTOIDECTOMY;  Surgeon: Leta Baptist, MD;  Location: [facility];  Service: ENT;  Laterality: Left;  Marland Kitchen VENA CAVA FILTER PLACEMENT  1997   [place] filter    Allergies  Allergen Reactions  . Adhesive [Tape] Rash    On on lower extremities       Medication List       Accurate as of 04/12/16  9:57 AM. Always  use your most recent med list.          acetaminophen 650 MG CR tablet Commonly known as:  TYLENOL Take 650 mg by mouth 2 (two) times daily. Every 6 hours as needed for pain   b complex vitamins capsule Take 1 capsule by mouth daily.   calcium-vitamin D 500-200 MG-UNIT tablet Commonly known as:  OSCAL WITH D Take 1 tablet by mouth daily.   cholecalciferol 1000 units tablet Commonly known as:  VITAMIN D One daily for vitamin D supplement   diclofenac sodium 1 % Gel Commonly known as:  VOLTAREN Apply 2 g topically 3 (three) times daily. Apply to left shoulder.   Fluticasone-Salmeterol 500-50 MCG/DOSE Aepb Commonly known as:  ADVAIR DISKUS INHALE 1 PUFF EVERY 12 HOURS.   gabapentin 300 MG capsule Commonly known as:  NEURONTIN Take 300 mg by mouth 3 (three) times daily.   guaiFENesin 600 MG 12 hr tablet Commonly known as:  MUCINEX Take 600 mg by mouth 2 (two) times daily.   latanoprost 0.005 % ophthalmic solution Commonly known as:  XALATAN Place 1 drop into the left eye at bedtime.   levalbuterol 0.63 MG/3ML nebulizer solution Commonly known as:  XOPENEX Take 0.63 mg by nebulization every 6 (six) hours as needed for wheezing or shortness of breath.   omeprazole 20 MG capsule Commonly known as:  PRILOSEC Take 20 mg by mouth daily.   PREPARATION H EX Apply topically. Apply ointment for hemorrhoids four times a day as needed for discomfort   SORE THROAT SPRAY 1.4 % Liqd Generic drug:  phenol Use as directed 1 spray in the mouth or throat as needed for throat irritation / pain.   sulfamethoxazole-trimethoprim 800-160 MG tablet Commonly known as:  BACTRIM DS,SEPTRA DS Take 1 tablet by mouth every Monday, Wednesday, and Friday. HOLD WHILE ON AUGMENTIN, THEN RESUME AS PREVIOUS   THERAVIM-M Tabs Take 1 tablet by mouth daily.   timolol 0.5 % ophthalmic solution Commonly known as:  BETIMOL Place 1 drop into the left eye daily with breakfast.   warfarin 4 MG  tablet Commonly known as:  COUMADIN Take 4 mg by mouth daily.       Review of Systems  Immunization History  Administered Date(s) Administered  . Influenza Split 03/04/2012, 03/11/2014, 04/01/2015  . Influenza,inj,Quad PF,36+ Mos 03/03/2013  . Influenza-Unspecified 04/01/2015  . PPD Test 05/25/2015  . Pneumococcal Conjugate-13 07/01/2015  . Pneumococcal Polysaccharide-23 09/02/2015  . Zoster 09/30/1994   Pertinent  Health Maintenance Due  Topic Date Due  . INFLUENZA VACCINE  02/01/2016  . DEXA SCAN  Completed  . PNA vac Low Risk Adult  Completed   Fall Risk  12/09/2015 09/23/2015 09/09/2015 02/03/2015 12/04/2014  Falls in the past year? No Yes Yes Yes Yes  Number falls in past yr: - 2 or more 2 or more  2 or more 2 or more  Injury with Fall? - No No - No   Functional Status Survey:    Vitals:   04/12/16 0942  BP: 136/72  Pulse: 82  Resp: 20  Temp: 98.8 F (37.1 C)  Weight: 141 lb (64 kg)  Height: 5\' 1"  (1.549 m)   Body mass index is 26.64 kg/m. Physical Exam  Labs reviewed:  Recent Labs  04/20/15 1409  09/15/15 1342 11/26/15 02/08/16  NA 140  < > 141 137 141  K 4.2  < > 4.1 4.1 4.1  CL 106  --  109  --   --   CO2 28  --  23  --   --   GLUCOSE 104*  --  97  --   --   BUN 12  < > 13 13 10   CREATININE 0.79  < > 0.87 0.8 0.8  CALCIUM 10.0  --  9.9  --   --   < > = values in this interval not displayed.  Recent Labs  08/12/15 11/26/15 02/08/16  AST 21 33 23  ALT 15 20 17   ALKPHOS 102 171* 127*    Recent Labs  09/23/15 0908  10/27/15 0711 11/26/15 12/02/15 1327 02/08/16  WBC 3.6*  < > 4.4 11.5 5.3 4.0  NEUTROABS 2.1  --   --   --  3.3  --   HGB 9.9*  < > 10.7* 12.3 12.1 11.7*  HCT 32.2*  < > 34.2* 38 37.0 37  MCV 80*  --  84.0  --  88  --   PLT 449*  < > 324 280 303 256  < > = values in this interval not displayed. Lab Results  Component Value Date   TSH 1.56 05/13/2015   Lab Results  Component Value Date   HGBA1C 5.8 (H) 07/28/2014   No  results found for: CHOL, HDL, LDLCALC, LDLDIRECT, TRIG, CHOLHDL  Significant Diagnostic Results in last 30 days:  No results found.  Assessment/Plan There are no diagnoses linked to this encounter.   Family/ staff Communication:   Labs/tests ordered:     This encounter was created in error - please disregard.

## 2016-04-27 ENCOUNTER — Non-Acute Institutional Stay: Payer: Medicare Other | Admitting: Internal Medicine

## 2016-04-27 ENCOUNTER — Encounter: Payer: Self-pay | Admitting: Internal Medicine

## 2016-04-27 VITALS — BP 138/76 | HR 80 | Temp 97.5°F | Ht 61.0 in | Wt 139.0 lb

## 2016-04-27 DIAGNOSIS — R5382 Chronic fatigue, unspecified: Secondary | ICD-10-CM

## 2016-04-27 DIAGNOSIS — Z86711 Personal history of pulmonary embolism: Secondary | ICD-10-CM

## 2016-04-27 DIAGNOSIS — D638 Anemia in other chronic diseases classified elsewhere: Secondary | ICD-10-CM

## 2016-04-27 DIAGNOSIS — R269 Unspecified abnormalities of gait and mobility: Secondary | ICD-10-CM | POA: Diagnosis not present

## 2016-04-27 DIAGNOSIS — S0100XA Unspecified open wound of scalp, initial encounter: Secondary | ICD-10-CM | POA: Diagnosis not present

## 2016-04-27 DIAGNOSIS — R531 Weakness: Secondary | ICD-10-CM

## 2016-04-27 DIAGNOSIS — J329 Chronic sinusitis, unspecified: Secondary | ICD-10-CM

## 2016-04-27 DIAGNOSIS — R609 Edema, unspecified: Secondary | ICD-10-CM

## 2016-04-27 DIAGNOSIS — Z7901 Long term (current) use of anticoagulants: Secondary | ICD-10-CM

## 2016-04-27 NOTE — Progress Notes (Signed)
[place] Room Number: [phone]  Place of Service: Clinic (12)     Allergies  Allergen Reactions  . Adhesive [Tape] Rash    On on lower extremities     Chief Complaint  Patient presents with  . Medical Management of Chronic Issues    3 month medication management anemia, anxiety, chronic fatigue, edema    HPI:  Anemia of chronic disease - tresolved in Aug 2017  Edema, unspecified type - improved  Chronic fatigue - improved  Long term current use of anticoagulant therapy - warfarin for hx PE  Personal history of PE (pulmonary embolism) - anticoagulated with warfarin  Chronic sinusitis, unspecified location - unchanged  Abnormality of gait - usually rides motorized wheelchair, but it is being repaired. She is in [facility] today.  Weak - improved  Open wound of scalp, unspecified open wound type, initial encounter - painful scabbed 2 mm wound of the left parietal scalp. This is the area of magnet application for he cochlear implant.      Medications: Patient's Medications  New Prescriptions   No medications on file  Previous Medications   ACETAMINOPHEN (TYLENOL) 650 MG CR TABLET    Take 650 mg by mouth 2 (two) times daily. Every 6 hours as needed for pain   B COMPLEX VITAMINS CAPSULE    Take 1 capsule by mouth daily.   CALCIUM-VITAMIN D (OSCAL WITH D) 500-200 MG-UNIT PER TABLET    Take 1 tablet by mouth daily.    CHOLECALCIFEROL (VITAMIN D) 1000 UNITS TABLET    One daily for vitamin D supplement   DICLOFENAC SODIUM (VOLTAREN) 1 % GEL    Apply 2 g topically 3 (three) times daily. Apply to left shoulder.   FLUTICASONE-SALMETEROL (ADVAIR DISKUS) 500-50 MCG/DOSE AEPB    INHALE 1 PUFF EVERY 12 HOURS.   GABAPENTIN (NEURONTIN) 300 MG CAPSULE    Take 300 mg by mouth 3 (three) times daily.    GUAIFENESIN (MUCINEX) 600 MG 12 HR TABLET    Take 600 mg by mouth 2 (two) times daily.   LATANOPROST (XALATAN) 0.005 % OPHTHALMIC SOLUTION    1 drop. One drop both eyes at  bedtime   LEVALBUTEROL (XOPENEX) 0.63 MG/3ML NEBULIZER SOLUTION    Take 0.63 mg by nebulization every 6 (six) hours as needed for wheezing or shortness of breath.   MULTIPLE VITAMINS-MINERALS (THERAVIM-M) TABS    Take 1 tablet by mouth daily.   OMEPRAZOLE (PRILOSEC) 20 MG CAPSULE    Take 20 mg by mouth daily.   PHENOL (SORE THROAT SPRAY) 1.4 % LIQD    Use as directed 1 spray in the mouth or throat as needed for throat irritation / pain.   SULFAMETHOXAZOLE-TRIMETHOPRIM (BACTRIM DS,SEPTRA DS) 800-160 MG PER TABLET    Take 1 tablet by mouth every Monday, Wednesday, and Friday. HOLD WHILE ON AUGMENTIN, THEN RESUME AS PREVIOUS   TIMOLOL (BETIMOL) 0.5 % OPHTHALMIC SOLUTION    Place 1 drop into the left eye daily with breakfast.    WARFARIN (COUMADIN) 4 MG TABLET    Take 4 mg by mouth daily.   WITCH HAZEL (PREPARATION H EX)    Apply topically. Apply ointment for hemorrhoids four times a day as needed for discomfort  Modified Medications   No medications on file  Discontinued Medications   No medications on file     Review of Systems  Constitutional: Negative for chills, diaphoresis and fever.       Frail and  generally weak.  HENT: Positive for hearing loss (Deaf on left, hearing aid on right ) and nosebleeds. Negative for congestion, ear discharge, ear pain and tinnitus.        Left cochlear implant in place and has a magnet that she applies to the left parietal scalp associated with the cochlear implant.  Eyes: Positive for discharge (OS) and redness (OS). Negative for pain.  Respiratory: Positive for cough and shortness of breath. Negative for wheezing (Intermittent).        History Wegener's granulomatosis. History of chronic sinusitis related to her Wegener's. Chronic cough and yellowish phlegm production   Cardiovascular: Positive for leg swelling. Negative for chest pain and palpitations.       Trace R+L foot/ankle, R>L  Gastrointestinal: Negative for abdominal pain, constipation, diarrhea  and nausea.       History of GERD.  Endocrine: Negative for polydipsia.  Genitourinary: Positive for frequency. Negative for dysuria, flank pain, hematuria and urgency.       Frequency induced by furosemide. Episodes of incontinence of urine.  Musculoskeletal: Positive for arthralgias and gait problem. Negative for back pain, myalgias and neck pain.       Generalized weakness Left upper extremity pain, limited ROM of the left shoulder  Skin: Negative for rash.       2 mm scabbed and tender wound of the left parietal scalp.  Neurological: Positive for weakness. Negative for dizziness, tremors, seizures and headaches.       Neuropathic leg/foot pain. Generalized weakness BLE  Hematological: Does not bruise/bleed easily.       Chronic anemia.  Psychiatric/Behavioral: Negative for hallucinations and suicidal ideas. The patient is nervous/anxious.        Husband is in SNF    Vitals:   04/27/16 1443  BP: 138/76  Pulse: 80  Temp: 97.5 F (36.4 C)  TempSrc: Oral  Weight: 139 lb (63 kg)  Height: 5' 1"  (1.549 m)   Wt Readings from Last 3 Encounters:  04/27/16 139 lb (63 kg)  04/12/16 141 lb (64 kg)  03/13/16 137 lb 8 oz (62.4 kg)    Body mass index is 26.26 kg/m.  Physical Exam  Constitutional: She is oriented to person, place, and time. She appears well-developed. No distress.  Thin and frail. Weight stable  since 3/16  HENT:  Head: Normocephalic and atraumatic.  Nose: Nose normal.  Mouth/Throat: Oropharynx is clear and moist. No oropharyngeal exudate.  Bilateral hearing loss. Hearing aid - right ear Pending left cochlear implant  Eyes: EOM are normal. Pupils are equal, round, and reactive to light. Right eye exhibits no discharge. Left eye exhibits no discharge. No scleral icterus.  Left conjunctiva quite red and bloodshot. There is a whitish exudate as well.  Neck: Normal range of motion. Neck supple. No JVD present. No tracheal deviation present. No thyromegaly present.    Cardiovascular: Normal rate, regular rhythm, normal heart sounds and intact distal pulses.   No murmur heard. Pulmonary/Chest: Effort normal. No stridor. No respiratory distress. She has no wheezes. She has rales. She exhibits no tenderness.  Abdominal: Soft. Bowel sounds are normal. She exhibits no distension and no mass. There is no tenderness. There is no rebound and no guarding.  Musculoskeletal: Normal range of motion. She exhibits edema (trace) and tenderness.  R+L foot/ankle, R>L, trace to 1+ Left upper extremity pain. L shoulder limited ROM  Lymphadenopathy:    She has no cervical adenopathy.  Neurological: She is alert and oriented to person, place,  and time. She has normal reflexes. No cranial nerve deficit. She exhibits normal muscle tone. Coordination normal.  The patient has history of neuropathy of bilateral lower extremities which resulted in generalized weakness and pain of legs, she is only able to ambulates a few steps with walker in her room and depend on w/c to go further. In order to maintain her quality of life and mobility, she needs wheelchair to travel to dinning room for meals and attend activities on the [place] where she resides. Due to her left upper extremity pain related to an injury occurred 4-5 years, she is unable to propel manual wheelchair or steer a scooter in her living space to maintain her mobility. Mrs Rummell manages her own affairs and finances with minimal assistance. She has worked with therapy in the past and has been deemed to be safe to operate a power chair.    Skin: Skin is warm and dry. No rash noted. She is not diaphoretic. No erythema. No pallor.  Psychiatric: She has a normal mood and affect. Her behavior is normal. Judgment and thought content normal.     Labs reviewed: Lab Summary Latest Ref Rng & Units 02/08/2016 12/02/2015 11/26/2015 10/27/2015 10/05/2015  Hemoglobin 12.0 - 16.0 g/dL 11.7(A) 12.1 12.3 10.7(L) 11.1(A)  Hematocrit 36 -  46 % 37 37.0 38 34.2(L) 36  White count 10:3/mL 4.0 5.3 11.5 4.4 3.8  Platelet count 150 - 399 K/L 256 303 280 324 354  Sodium 137 - 147 mmol/L 141 (None) 137 (None) (None)  Potassium 3.4 - 5.3 mmol/L 4.1 (None) 4.1 (None) (None)  Calcium - (None) (None) (None) (None) (None)  Phosphorus - (None) (None) (None) (None) (None)  Creatinine 0.5 - 1.1 mg/dL 0.8 (None) 0.8 (None) (None)  AST 13 - 35 U/L 23 (None) 33 (None) (None)  Alk Phos 25 - 125 U/L 127(A) (None) 171(A) (None) (None)  Bilirubin - (None) (None) (None) (None) (None)  Glucose mg/dL 87 (None) 102 (None) (None)  Cholesterol - (None) (None) (None) (None) (None)  HDL cholesterol - (None) (None) (None) (None) (None)  Triglycerides - (None) (None) (None) (None) (None)  LDL Direct - (None) (None) (None) (None) (None)  LDL Calc - (None) (None) (None) (None) (None)  Total protein - (None) (None) (None) (None) (None)  Albumin - (None) (None) (None) (None) (None)  Some recent data might be hidden   Lab Results  Component Value Date   TSH 1.56 05/13/2015   Lab Results  Component Value Date   BUN 10 02/08/2016   BUN 13 11/26/2015   BUN 13 09/15/2015   Lab Results  Component Value Date   CREATININE 0.8 02/08/2016   CREATININE 0.8 11/26/2015   CREATININE 0.87 09/15/2015   Lab Results  Component Value Date   HGBA1C 5.8 (H) 07/28/2014       Assessment/Plan  1. Anemia of chronic disease resplved  2. Edema, unspecified type improved  3. Chronic fatigue immproved  4. Long term current use of anticoagulant therapy continue  5. Personal history of PE (pulmonary embolism) Continue warfarin  6. Chronic sinusitis, unspecified location improved  7. Abnormality of gait Needs to get the motorized wheelchair repaired  8. Weak improved  9. Open wound of scalp, unspecified open wound type, initial encounter Does not appear inflamed and should resolve without further attention.

## 2016-05-09 LAB — PROTIME-INR: Protime: 2.5 seconds — AB (ref 10.0–13.8)

## 2016-05-09 LAB — POCT INR: INR: 25.7 — AB (ref <=1.1)

## 2016-05-10 ENCOUNTER — Other Ambulatory Visit: Payer: Self-pay | Admitting: *Deleted

## 2016-05-23 LAB — POCT INR: INR: 2.4 — AB (ref <=1.1)

## 2016-05-23 LAB — PROTIME-INR: PROTIME: 24.9 s — AB (ref 10.0–13.8)

## 2016-05-24 ENCOUNTER — Encounter: Payer: Self-pay | Admitting: Nurse Practitioner

## 2016-05-24 NOTE — Progress Notes (Signed)
Location:  [place] Room Number: [id number] Place of Service:  ALF [phone]) Provider:  Mast, Manxie  NP  Jeanmarie Hubert, MD  Patient Care Team: Estill Dooms, MD as PCP - General (Internal Medicine) Unice Bailey, MD as Consulting Physician (Rheumatology) Rozetta Nunnery, MD as Consulting Physician (Otolaryngology) Marcial Pacas, MD as Consulting Physician (Neurology) Michel Bickers, MD as Consulting Physician (Infectious Diseases) Volanda Napoleon, MD as Consulting Physician (Oncology) Juanito Doom, MD as Consulting Physician (Pulmonary Disease) Tanda Rockers, MD as Consulting Physician (Pulmonary Disease) Estill Dooms, MD as Consulting Physician (Geriatric Medicine) Coralie Keens, MD as Consulting Physician (General Surgery) Man Otho Darner, NP as Nurse Practitioner (Nurse Practitioner)  Extended Emergency Contact Information Primary Emergency Contact: Haygood,Sandra Address: [address] [place] [address] Johnnette Litter of [place] Phone: [phone] Relation: Daughter Secondary Emergency Contact: Yaeger,[name] Address: [address] [place]          [place], [place] [address] [place] of [place] Phone: [phone] Mobile Phone: [phone] Relation: Son  Code Status:  Full Code Goals of care: Advanced Directive information Advanced Directives 05/24/2016  Does Patient Have a Medical Advance Directive? Yes  Type of Paramedic of [place];Living will  Does patient want to make changes to medical advance directive? No - Patient declined  Copy of [place] in Chart? Yes  Would patient like information on creating a medical advance directive? -     Chief Complaint  Patient presents with  . Acute Visit    Has sore on her left great toe, open area    HPI:  Pt is a 82 y.o. female seen today for an acute visit for    Past Medical History:  Diagnosis Date  . Acute  respiratory failure with hypoxia ([place])   . Anemia   . Aspergillosis ([place])   . Bladder incontinence   . Blindness of one eye   . Cancer ([place])    melanoma   (chemo)  . Chronic sinusitis   . Clotting disorder ([place])   . Difficult intubation    Difficult airway with intubation for ARF 07/22/14 (due to anterior larynx, also had mucous plug)  . Fatigue 12/09/2015  . Glaucoma   . Hearing loss in left ear    hearing aid both ears  . History of pulmonary embolism 1997  . Interstitial emphysema ([place])   . Left shoulder pain 12/09/2015  . Multiple allergies   . Neuropathy ([place])   . OA (osteoarthritis)   . Pneumonia 12/15  . Shortness of breath dyspnea   . Stroke [facility])    ??? blind left eye  . Wegener's granulomatosis ([place])    Past Surgical History:  Procedure Laterality Date  . bronchosocpy     pt. states she has had over 10 in last 20 yrs.  Marland Kitchen CATARACT EXTRACTION    . COCHLEAR IMPLANT Left 10/27/2015   Procedure: LEFT COCHLEAR IMPLANT;  Surgeon: Leta Baptist, MD;  Location: [place];  Service: ENT;  Laterality: Left;  . INGUINAL HERNIA REPAIR Left 02/17/2014   Procedure: LEFT INGUINAL HERNIA REPAIR WITH MESH;  Surgeon: Harl Bowie, MD;  Location: [place] [facility];  Service: General;  Laterality: Left;  . INSERTION OF MESH N/A 02/17/2014   Procedure: INSERTION OF MESH;  Surgeon: Harl Bowie, MD;  Location: [facility];  Service: General;  Laterality: N/A;  .  KNEE ARTHROSCOPY  2006   rt   . MELANOMA EXCISION     left leg  . PUBOVAGINAL SLING    . TEAR DUCT PROBING    . TUBAL LIGATION    . TYMPANOMASTOIDECTOMY Left 04/28/2015  . TYMPANOMASTOIDECTOMY Left 04/28/2015   Procedure: LEFT TYMPANOMASTOIDECTOMY;  Surgeon: Leta Baptist, MD;  Location: [place];  Service: ENT;  Laterality: Left;  Marland Kitchen VENA CAVA FILTER PLACEMENT  1997   [place] filter    Allergies  Allergen Reactions  . Adhesive [Tape] Rash    On on lower extremities       Medication List         Accurate as of 05/24/16 12:24 PM. Always use your most recent med list.          acetaminophen 650 MG CR tablet Commonly known as:  TYLENOL Take 650 mg by mouth 2 (two) times daily. Every 6 hours as needed for pain   b complex vitamins capsule Take 1 capsule by mouth daily.   calcium-vitamin D 500-200 MG-UNIT tablet Commonly known as:  OSCAL WITH D Take 1 tablet by mouth daily.   cholecalciferol 1000 units tablet Commonly known as:  VITAMIN D One daily for vitamin D supplement   diclofenac sodium 1 % Gel Commonly known as:  VOLTAREN Apply 2 g topically 3 (three) times daily. Apply to left shoulder.   Fluticasone-Salmeterol 500-50 MCG/DOSE Aepb Commonly known as:  ADVAIR DISKUS INHALE 1 PUFF EVERY 12 HOURS.   gabapentin 300 MG capsule Commonly known as:  NEURONTIN Take 300 mg by mouth 3 (three) times daily.   guaiFENesin 600 MG 12 hr tablet Commonly known as:  MUCINEX Take 600 mg by mouth 2 (two) times daily.   latanoprost 0.005 % ophthalmic solution Commonly known as:  XALATAN 1 drop. One drop both eyes at bedtime   levalbuterol 0.63 MG/3ML nebulizer solution Commonly known as:  XOPENEX Take 0.63 mg by nebulization every 6 (six) hours as needed for wheezing or shortness of breath.   omeprazole 20 MG capsule Commonly known as:  PRILOSEC Take 20 mg by mouth daily.   PREPARATION H EX Apply topically. Apply ointment for hemorrhoids four times a day as needed for discomfort   SORE THROAT SPRAY 1.4 % Liqd Generic drug:  phenol Use as directed 1 spray in the mouth or throat as needed for throat irritation / pain.   sulfamethoxazole-trimethoprim 800-160 MG tablet Commonly known as:  BACTRIM DS,SEPTRA DS Take 1 tablet by mouth every Monday, Wednesday, and Friday. HOLD WHILE ON AUGMENTIN, THEN RESUME AS PREVIOUS   THERAVIM-M Tabs Take 1 tablet by mouth daily.   timolol 0.5 % ophthalmic solution Commonly known as:  BETIMOL Place 1 drop into the left eye daily  with breakfast.   warfarin 4 MG tablet Commonly known as:  COUMADIN Take 4 mg by mouth daily.       Review of Systems  Immunization History  Administered Date(s) Administered  . Influenza Split 03/04/2012, 03/11/2014, 04/01/2015  . Influenza,inj,Quad PF,36+ Mos 03/03/2013  . Influenza-Unspecified 04/01/2015, 04/19/2016  . PPD Test 05/25/2015  . Pneumococcal Conjugate-13 07/01/2015  . Pneumococcal Polysaccharide-23 09/02/2015  . Zoster 09/30/1994   Pertinent  Health Maintenance Due  Topic Date Due  . INFLUENZA VACCINE  Completed  . DEXA SCAN  Completed  . PNA vac Low Risk Adult  Completed   Fall Risk  12/09/2015 09/23/2015 09/09/2015 02/03/2015 12/04/2014  Falls in the past year? No Yes Yes Yes Yes  Number falls  in past yr: - 2 or more 2 or more 2 or more 2 or more  Injury with Fall? - No No - No   Functional Status Survey:    Vitals:   05/24/16 1213  BP: 126/70  Pulse: 80  Resp: 20  Temp: 98.9 F (37.2 C)  Height: 5\' 1"  (1.549 m)   There is no height or weight on file to calculate BMI. Physical Exam  Labs reviewed:  Recent Labs  09/15/15 1342 11/26/15 02/08/16  NA 141 137 141  K 4.1 4.1 4.1  CL 109  --   --   CO2 23  --   --   GLUCOSE 97  --   --   BUN 13 13 10   CREATININE 0.87 0.8 0.8  CALCIUM 9.9  --   --     Recent Labs  08/12/15 11/26/15 02/08/16  AST 21 33 23  ALT 15 20 17   ALKPHOS 102 171* 127*    Recent Labs  09/23/15 0908  10/27/15 0711 11/26/15 12/02/15 1327 02/08/16  WBC 3.6*  < > 4.4 11.5 5.3 4.0  NEUTROABS 2.1  --   --   --  3.3  --   HGB 9.9*  < > 10.7* 12.3 12.1 11.7*  HCT 32.2*  < > 34.2* 38 37.0 37  MCV 80*  --  84.0  --  88  --   PLT 449*  < > 324 280 303 256  < > = values in this interval not displayed. Lab Results  Component Value Date   TSH 1.56 05/13/2015   Lab Results  Component Value Date   HGBA1C 5.8 (H) 07/28/2014   No results found for: CHOL, HDL, LDLCALC, LDLDIRECT, TRIG, CHOLHDL  Significant Diagnostic  Results in last 30 days:  No results found.  Assessment/Plan There are no diagnoses linked to this encounter.   Family/ staff Communication:   Labs/tests ordered:    This encounter was created in error - please disregard.

## 2016-05-30 ENCOUNTER — Ambulatory Visit (INDEPENDENT_AMBULATORY_CARE_PROVIDER_SITE_OTHER): Payer: Medicare Other | Admitting: Pulmonary Disease

## 2016-05-30 ENCOUNTER — Encounter: Payer: Self-pay | Admitting: Pulmonary Disease

## 2016-05-30 DIAGNOSIS — J45909 Unspecified asthma, uncomplicated: Secondary | ICD-10-CM | POA: Diagnosis not present

## 2016-05-30 DIAGNOSIS — M3130 Wegener's granulomatosis without renal involvement: Secondary | ICD-10-CM

## 2016-05-30 NOTE — Progress Notes (Signed)
Subjective:    Patient ID: [name], female    DOB: 11-23-1932, 82 y.o.   MRN: [id number]  Synopsis: [name] has asthma with normal pulmonary function testing. She also has a history of Wegener's granulomatosis and has been treated with high-dose immunosuppressants for many years. She has significant sinus disease related to this and she has lower airway obstruction which has been treated with balloon dilation in the past.  HPI Chief Complaint  Patient presents with  . Follow-up    pt states she is doing well, no complaints today.    [name] has been doing well.    Wegeners: > saw the ENT doc a month ago who said there was a lot of inflammation in there > she continues to take saline rinses twice a day, this helps > still gets up a lot of mucus in the mornings  Asthma: > stable, no trouble breath > she has a cough for 20 years, not any worse > no flare ups of the asthma since the last visit > still taking Advair > no rescue inhaler use > had a flu  Past Medical History:  Diagnosis Date  . Acute respiratory failure with hypoxia ([place])   . Anemia   . Aspergillosis ([place])   . Bladder incontinence   . Blindness of one eye   . Cancer ([place])    melanoma   ([place])  . Chronic sinusitis   . Clotting disorder ([place])   . Difficult intubation    Difficult airway with intubation for ARF 07/22/14 (due to anterior larynx, also had mucous plug)  . Fatigue 12/09/2015  . Glaucoma   . Hearing loss in left ear    hearing aid both ears  . History of pulmonary embolism 1997  . Interstitial emphysema ([place])   . Left shoulder pain 12/09/2015  . Multiple allergies   . Neuropathy ([place])   . OA (osteoarthritis)   . Pneumonia 12/15  . Shortness of breath dyspnea   . Stroke [facility])    ??? blind left eye  . Wegener's granulomatosis ([place])       Review of Systems  Constitutional: Positive for fatigue. Negative for fever.  HENT: Positive for postnasal drip, rhinorrhea and sinus pressure.     Respiratory: Positive for cough. Negative for shortness of breath and wheezing.   Cardiovascular: Negative for chest pain, palpitations and leg swelling.       Objective:   Physical Exam  Vitals:   05/30/16 1341  BP: 128/72  Pulse: 77  SpO2: 98%  Weight: 142 lb (64.4 kg)  Height: 5\' 1"  (1.549 m)   RA  Gen: well appearing HENT: OP clear, neck supple PULM: Mild wheezing bilaterlaly, normal effort CV: RRR, no mgr, trace edema GI: BS+, soft, nontender Derm: no cyanosis or rash Psyche: normal mood and affect     CBC    Component Value Date/Time   WBC 4.0 02/08/2016   WBC 4.4 10/27/2015 0711   RBC 4.21 12/02/2015 1327   RBC 4.07 10/27/2015 0711   HGB 11.7 (A) 02/08/2016   HGB 12.1 12/02/2015 1327   HCT 37 02/08/2016   HCT 37.0 12/02/2015 1327   PLT 256 02/08/2016   PLT 303 12/02/2015 1327   MCV 88 12/02/2015 1327   MCH 28.7 12/02/2015 1327   MCH 26.3 10/27/2015 0711   MCHC 32.7 12/02/2015 1327   MCHC 31.3 10/27/2015 0711   RDW 21.2 (H) 12/02/2015 1327   LYMPHSABS 0.9 12/02/2015 1327   MONOABS  0.3 07/24/2014 0420   EOSABS 0.2 12/02/2015 1327   BASOSABS 0.0 12/02/2015 1327         Assessment & Plan:  WEGENERS GRANULOMATOSIS This is been a stable interval without evidence of progression in regards to the pulmonary manifestations of her Wegener's. Most of her diseases sinus related.  Plan: Continue management by [facility] rheumatology an ear nose and throat  Intrinsic asthma This has been a stable interval for [name]. No exacerbations since the last visit.  Plan: Continue Advair Flu shot up-to-date Follow-up one year    Current Outpatient Prescriptions:  .  acetaminophen (TYLENOL) 650 MG CR tablet, Take 650 mg by mouth 2 (two) times daily. Every 6 hours as needed for pain, Disp: , Rfl:  .  b complex vitamins capsule, Take 1 capsule by mouth daily., Disp: , Rfl:  .  calcium-vitamin D (OSCAL WITH D) 500-200 MG-UNIT per tablet, Take 1 tablet by  mouth daily. , Disp: , Rfl:  .  cholecalciferol (VITAMIN D) 1000 UNITS tablet, One daily for vitamin D supplement, Disp: 30 tablet, Rfl: 11 .  diclofenac sodium (VOLTAREN) 1 % GEL, Apply 2 g topically 3 (three) times daily. Apply to left shoulder., Disp: , Rfl:  .  Fluticasone-Salmeterol (ADVAIR DISKUS) 500-50 MCG/DOSE AEPB, INHALE 1 PUFF EVERY 12 HOURS., Disp: 180 each, Rfl: 1 .  gabapentin (NEURONTIN) 300 MG capsule, Take 300 mg by mouth 3 (three) times daily. , Disp: , Rfl:  .  guaiFENesin (MUCINEX) 600 MG 12 hr tablet, Take 600 mg by mouth 2 (two) times daily., Disp: , Rfl:  .  latanoprost (XALATAN) 0.005 % ophthalmic solution, 1 drop. One drop both eyes at bedtime, Disp: , Rfl:  .  levalbuterol (XOPENEX) 0.63 MG/3ML nebulizer solution, Take 0.63 mg by nebulization every 6 (six) hours as needed for wheezing or shortness of breath., Disp: , Rfl:  .  Multiple Vitamins-Minerals (THERAVIM-M) TABS, Take 1 tablet by mouth daily., Disp: , Rfl:  .  omeprazole (PRILOSEC) 20 MG capsule, Take 20 mg by mouth daily., Disp: , Rfl:  .  phenol (SORE THROAT SPRAY) 1.4 % LIQD, Use as directed 1 spray in the mouth or throat as needed for throat irritation / pain., Disp: , Rfl:  .  sulfamethoxazole-trimethoprim (BACTRIM DS,SEPTRA DS) 800-160 MG per tablet, Take 1 tablet by mouth every Monday, Wednesday, and Friday. HOLD WHILE ON AUGMENTIN, THEN RESUME AS PREVIOUS (Patient taking differently: Take 1 tablet by mouth every Monday, Wednesday, and Friday. ), Disp: , Rfl: 1 .  timolol (BETIMOL) 0.5 % ophthalmic solution, Place 1 drop into the left eye daily with breakfast. , Disp: , Rfl:  .  warfarin (COUMADIN) 4 MG tablet, Take 4 mg by mouth daily., Disp: , Rfl:  .  Witch Hazel (PREPARATION H EX), Apply topically. Apply ointment for hemorrhoids four times a day as needed for discomfort, Disp: , Rfl:

## 2016-05-30 NOTE — Assessment & Plan Note (Signed)
This has been a stable interval for [name]. No exacerbations since the last visit.  Plan: Continue Advair Flu shot up-to-date Follow-up one year

## 2016-05-30 NOTE — Patient Instructions (Signed)
Keep taking your Advair as you're doing We will see you back in one year or sooner if needed

## 2016-05-30 NOTE — Assessment & Plan Note (Signed)
This is been a stable interval without evidence of progression in regards to the pulmonary manifestations of her Wegener's. Most of her diseases sinus related.  Plan: Continue management by [facility] rheumatology an ear nose and throat

## 2016-05-31 NOTE — Progress Notes (Signed)
This encounter was created in error - please disregard.  This encounter was created in error - please disregard.

## 2016-06-01 ENCOUNTER — Encounter: Payer: Self-pay | Admitting: Family

## 2016-06-01 ENCOUNTER — Other Ambulatory Visit (HOSPITAL_BASED_OUTPATIENT_CLINIC_OR_DEPARTMENT_OTHER): Payer: Medicare Other

## 2016-06-01 ENCOUNTER — Other Ambulatory Visit: Payer: Self-pay | Admitting: *Deleted

## 2016-06-01 ENCOUNTER — Ambulatory Visit (HOSPITAL_BASED_OUTPATIENT_CLINIC_OR_DEPARTMENT_OTHER): Payer: Medicare Other | Admitting: Family

## 2016-06-01 VITALS — BP 143/67 | HR 74 | Temp 98.3°F | Resp 16 | Ht 61.0 in | Wt 143.0 lb

## 2016-06-01 DIAGNOSIS — M3130 Wegener's granulomatosis without renal involvement: Secondary | ICD-10-CM

## 2016-06-01 DIAGNOSIS — D638 Anemia in other chronic diseases classified elsewhere: Secondary | ICD-10-CM

## 2016-06-01 DIAGNOSIS — D509 Iron deficiency anemia, unspecified: Secondary | ICD-10-CM

## 2016-06-01 LAB — CBC WITH DIFFERENTIAL (CANCER CENTER ONLY)
BASO#: 0 10*3/uL (ref 0.0–0.2)
BASO%: 0.6 % (ref 0.0–2.0)
EOS%: 3.2 % (ref 0.0–7.0)
Eosinophils Absolute: 0.2 10*3/uL (ref 0.0–0.5)
HCT: 39.6 % (ref 34.8–46.6)
HGB: 12.9 g/dL (ref 11.6–15.9)
LYMPH#: 1 10*3/uL (ref 0.9–3.3)
LYMPH%: 18.3 % (ref 14.0–48.0)
MCH: 29.7 pg (ref 26.0–34.0)
MCHC: 32.6 g/dL (ref 32.0–36.0)
MCV: 91 fL (ref 81–101)
MONO#: 0.7 10*3/uL (ref 0.1–0.9)
MONO%: 13.1 % — ABNORMAL HIGH (ref 0.0–13.0)
NEUT#: 3.4 10*3/uL (ref 1.5–6.5)
NEUT%: 64.8 % (ref 39.6–80.0)
PLATELETS: 276 10*3/uL (ref 145–400)
RBC: 4.34 10*6/uL (ref 3.70–5.32)
RDW: 16.5 % — AB (ref 11.1–15.7)
WBC: 5.3 10*3/uL (ref 3.9–10.0)

## 2016-06-01 NOTE — Progress Notes (Signed)
Hematology and Oncology Follow Up Visit  [name] [id number] Apr 05, 1933 82 y.o. 06/01/2016   Principle Diagnosis:  Recurrent iron deficiency anemia Wegener's granulomatosis  Current Therapy:   IV iron as indicated - last received in March 2017    Interim History:  [name] is here for a follow-up. She has intermittent fatigue at times but is otherwise doing quite well. She had a wonderful Thanksgiving with her family.  She last received Feraheme in March. Iron saturation in June was 25% with a ferritin of 92.  She has had no episodes of bleeding or bruising on Coumadin.  No fever, chills, n/v, rash, dizziness, chest pain, palpitations, abdominal pain, no changes in her bowel or bladder habits.  No SOB at this time. She states that her pulmonologist sees her once a year now. No lymphadenopathy found on assessment.  The arthritis in her knees and feet is the same and improves with Tylenol. The neuropathy in her feet is unchanged. No swelling in her extremities at this time.  She has maintained a good appetite and is staying well hydrated. Her weight is stable.   Medications:    Medication List       Accurate as of 06/01/16  1:46 PM. Always use your most recent med list.          acetaminophen 650 MG CR tablet Commonly known as:  TYLENOL Take 650 mg by mouth 2 (two) times daily. Every 6 hours as needed for pain   b complex vitamins capsule Take 1 capsule by mouth daily.   calcium-vitamin D 500-200 MG-UNIT tablet Commonly known as:  OSCAL WITH D Take 1 tablet by mouth daily.   cholecalciferol 1000 units tablet Commonly known as:  VITAMIN D One daily for vitamin D supplement   diclofenac sodium 1 % Gel Commonly known as:  VOLTAREN Apply 2 g topically 3 (three) times daily. Apply to left shoulder.   Fluticasone-Salmeterol 500-50 MCG/DOSE Aepb Commonly known as:  ADVAIR DISKUS INHALE 1 PUFF EVERY 12 HOURS.   gabapentin 300 MG capsule Commonly known as:   NEURONTIN Take 300 mg by mouth 3 (three) times daily.   guaiFENesin 600 MG 12 hr tablet Commonly known as:  MUCINEX Take 600 mg by mouth 2 (two) times daily.   latanoprost 0.005 % ophthalmic solution Commonly known as:  XALATAN 1 drop. One drop both eyes at bedtime   levalbuterol 0.63 MG/3ML nebulizer solution Commonly known as:  XOPENEX Take 0.63 mg by nebulization every 6 (six) hours as needed for wheezing or shortness of breath.   omeprazole 20 MG capsule Commonly known as:  PRILOSEC Take 20 mg by mouth daily.   PREPARATION H EX Apply topically. Apply ointment for hemorrhoids four times a day as needed for discomfort   SORE THROAT SPRAY 1.4 % Liqd Generic drug:  phenol Use as directed 1 spray in the mouth or throat as needed for throat irritation / pain.   sulfamethoxazole-trimethoprim 800-160 MG tablet Commonly known as:  BACTRIM DS,SEPTRA DS Take 1 tablet by mouth every Monday, Wednesday, and Friday. HOLD WHILE ON AUGMENTIN, THEN RESUME AS PREVIOUS   THERAVIM-M Tabs Take 1 tablet by mouth daily.   timolol 0.5 % ophthalmic solution Commonly known as:  BETIMOL Place 1 drop into the left eye daily with breakfast.   warfarin 4 MG tablet Commonly known as:  COUMADIN Take 4 mg by mouth daily.       Allergies:  Allergies  Allergen Reactions  . Adhesive [Tape]  Rash    On on lower extremities     Past Medical History, Surgical history, Social history, and Family History were reviewed and updated.  Review of Systems: All other 10 point review of systems is negative.   Physical Exam:  height is 5\' 1"  (1.549 m) and weight is 143 lb (64.9 kg). Her oral temperature is 98.3 F (36.8 C). Her blood pressure is 143/67 (abnormal) and her pulse is 74. Her respiration is 16.   Wt Readings from Last 3 Encounters:  06/01/16 143 lb (64.9 kg)  05/30/16 142 lb (64.4 kg)  04/27/16 139 lb (63 kg)    Ocular: Sclerae unicteric, pupils equal, round and reactive to  light Ear-nose-throat: Oropharynx clear, dentition fair Lymphatic: No cervical supraclavicular or axillary adenopathy Lungs no rales or rhonchi, good excursion bilaterally Heart regular rate and rhythm, no murmur appreciated Abd soft, nontender, positive bowel sounds, no liver or spleen tip palpated on exam, no fluid wave  MSK no focal spinal tenderness, no joint edema Neuro: non-focal, well-oriented, appropriate affect Breasts: Deferred  Lab Results  Component Value Date   WBC 5.3 06/01/2016   HGB 12.9 06/01/2016   HCT 39.6 06/01/2016   MCV 91 06/01/2016   PLT 276 06/01/2016   Lab Results  Component Value Date   FERRITIN 92 12/02/2015   IRON 65 12/02/2015   TIBC 260 12/02/2015   UIBC 195 12/02/2015   IRONPCTSAT 25 12/02/2015   Lab Results  Component Value Date   RETICCTPCT 1.6 02/03/2015   RBC 4.34 06/01/2016   RETICCTABS 62.4 02/03/2015   No results found for: KPAFRELGTCHN, LAMBDASER, KAPLAMBRATIO No results found for: IGGSERUM, IGA, IGMSERUM No results found for: Kathrynn Ducking, MSPIKE, SPEI   Chemistry      Component Value Date/Time   NA 141 02/08/2016   K 4.1 02/08/2016   CL 109 09/15/2015 1342   CO2 23 09/15/2015 1342   BUN 10 02/08/2016   CREATININE 0.8 02/08/2016   CREATININE 0.87 09/15/2015 1342   GLU 87 02/08/2016      Component Value Date/Time   CALCIUM 9.9 09/15/2015 1342   ALKPHOS 127 (A) 02/08/2016   AST 23 02/08/2016   ALT 17 02/08/2016   BILITOT 0.6 07/28/2014 0545     Impression and Plan: [name] is 82 yo female with iron deficiency anemia and a history of wegener's granulomatosis. She has had a nice response to [place] last in March 2017. She still has intermittent fatigue at times.  CBC today looks good with Hgb 12.9 and MCV 91. We will see what her iron studies show and bring her in next week an infusion if needed.  We will plan to see her back in 8 months for repeat labs and follow-up per her  request.  She will contact us with any questions or concerns. We can certainly see her sooner if need be.   Eliezer Bottom, NP 11/30/20171:46 PM

## 2016-06-02 ENCOUNTER — Other Ambulatory Visit: Payer: Medicare Other

## 2016-06-02 ENCOUNTER — Ambulatory Visit: Payer: Medicare Other | Admitting: Family

## 2016-06-02 LAB — IRON AND TIBC
%SAT: 18 % — ABNORMAL LOW (ref 21–57)
Iron: 66 ug/dL (ref 41–142)
TIBC: 364 ug/dL (ref 236–444)
UIBC: 298 ug/dL (ref 120–384)

## 2016-06-02 LAB — FERRITIN: Ferritin: 41 ng/ml (ref 9–269)

## 2016-06-02 LAB — RETICULOCYTES: RETICULOCYTE COUNT: 1.5 % (ref 0.6–2.6)

## 2016-06-05 ENCOUNTER — Telehealth: Payer: Self-pay | Admitting: *Deleted

## 2016-06-05 NOTE — Telephone Encounter (Addendum)
Patient aware of results. Transferred to scheduler to make appointment  ----- Message from Eliezer Bottom, NP sent at 06/02/2016  1:55 PM EST ----- Regarding: Iron  Iron studies still a little low. She will need one dose of iron next week. Thank you!  Judson Roch

## 2016-06-06 ENCOUNTER — Telehealth: Payer: Self-pay

## 2016-06-06 NOTE — Telephone Encounter (Signed)
Pt was returning call. Requested call back. Called pt back and gave message re iron still low and needing another tx. Pt was aware of this and spoke her appt date and time.

## 2016-06-07 ENCOUNTER — Non-Acute Institutional Stay: Payer: Medicare Other | Admitting: Nurse Practitioner

## 2016-06-07 ENCOUNTER — Encounter: Payer: Self-pay | Admitting: Nurse Practitioner

## 2016-06-07 DIAGNOSIS — K219 Gastro-esophageal reflux disease without esophagitis: Secondary | ICD-10-CM | POA: Diagnosis not present

## 2016-06-07 DIAGNOSIS — J45909 Unspecified asthma, uncomplicated: Secondary | ICD-10-CM | POA: Diagnosis not present

## 2016-06-07 DIAGNOSIS — M4120 Other idiopathic scoliosis, site unspecified: Secondary | ICD-10-CM

## 2016-06-07 DIAGNOSIS — H4720 Unspecified optic atrophy: Secondary | ICD-10-CM

## 2016-06-07 DIAGNOSIS — N3942 Incontinence without sensory awareness: Secondary | ICD-10-CM

## 2016-06-07 DIAGNOSIS — G600 Hereditary motor and sensory neuropathy: Secondary | ICD-10-CM | POA: Diagnosis not present

## 2016-06-07 DIAGNOSIS — R609 Edema, unspecified: Secondary | ICD-10-CM | POA: Diagnosis not present

## 2016-06-07 DIAGNOSIS — F411 Generalized anxiety disorder: Secondary | ICD-10-CM

## 2016-06-07 DIAGNOSIS — Z7901 Long term (current) use of anticoagulants: Secondary | ICD-10-CM

## 2016-06-07 DIAGNOSIS — D638 Anemia in other chronic diseases classified elsewhere: Secondary | ICD-10-CM

## 2016-06-07 DIAGNOSIS — K5900 Constipation, unspecified: Secondary | ICD-10-CM | POA: Diagnosis not present

## 2016-06-07 NOTE — Assessment & Plan Note (Signed)
INR goal 2-3, continue Coumadin

## 2016-06-07 NOTE — Assessment & Plan Note (Signed)
Prn Lorazepam is adequate for her

## 2016-06-07 NOTE — Assessment & Plan Note (Signed)
Stable

## 2016-06-07 NOTE — Assessment & Plan Note (Signed)
Stable, continue Omeprazole 20mg daily.  

## 2016-06-07 NOTE — Assessment & Plan Note (Signed)
Lower extremities weakness, motorized w/c for mobility, continue Neurontin   

## 2016-06-07 NOTE — Progress Notes (Signed)
Location:  [facility] Room Number: [phone] Place of Service:  ALF [phone]) Provider:  Marlana Latus, NP  Jeanmarie Hubert, MD  Patient Care Team: Estill Dooms, MD as PCP - General (Internal Medicine) Unice Bailey, MD as Consulting Physician (Rheumatology) Rozetta Nunnery, MD as Consulting Physician (Otolaryngology) Marcial Pacas, MD as Consulting Physician (Neurology) Michel Bickers, MD as Consulting Physician (Infectious Diseases) Volanda Napoleon, MD as Consulting Physician (Oncology) Juanito Doom, MD as Consulting Physician (Pulmonary Disease) Tanda Rockers, MD as Consulting Physician (Pulmonary Disease) Estill Dooms, MD as Consulting Physician (Geriatric Medicine) Coralie Keens, MD as Consulting Physician (General Surgery) Maor Meckel Otho Darner, NP as Nurse Practitioner (Nurse Practitioner)  Extended Emergency Contact Information Primary Emergency Contact: Haygood,Sandra Address: [address] [place] [address] Johnnette Litter of [place] Phone: [phone] Relation: Daughter Secondary Emergency Contact: Reczek,[name] Address: [address] [place]          [place], [place] [address] [place] of [place] Phone: [phone] Mobile Phone: [phone] Relation: Son  Code Status:  Full code Goals of care: Advanced Directive information Advanced Directives 06/07/2016  Does Patient Have a Medical Advance Directive? Yes  Type of Paramedic of [place];Living will  Does patient want to make changes to medical advance directive? -  Copy of [place] in Chart? Yes  Would patient like information on creating a medical advance directive? -     Chief Complaint  Patient presents with  . Medical Management of Chronic Issues    routine visit    HPI:  Pt is a 82 y.o. female seen today for medical management of chronic diseases.      Hx of Anemia, Hgb improved to 11.7 02/08/16, taking Fe, iron  level improved from 29 to 65,  workup and tx under Hematology. Peripheral neuropathy, w/c for mobility, takes Gabapentin 300mg  tid. GERD symptomatic free on Omeprazole 20mg  daily.  Past Medical History:  Diagnosis Date  . Acute respiratory failure with hypoxia ([facility])   . Anemia   . Aspergillosis ([place])   . Bladder incontinence   . Blindness of one eye   . Cancer ([facility])    melanoma   ([place])  . Chronic sinusitis   . Clotting disorder ([place])   . Difficult intubation    Difficult airway with intubation for ARF 07/22/14 (due to anterior larynx, also had mucous plug)  . Fatigue 12/09/2015  . Glaucoma   . Hearing loss in left ear    hearing aid both ears  . History of pulmonary embolism 1997  . Interstitial emphysema ([place])   . Left shoulder pain 12/09/2015  . Multiple allergies   . Neuropathy ([place])   . OA (osteoarthritis)   . Pneumonia 12/15  . Shortness of breath dyspnea   . Stroke [facility])    ??? blind left eye  . Wegener's granulomatosis ([place])    Past Surgical History:  Procedure Laterality Date  . bronchosocpy     pt. states she has had over 10 in last 20 yrs.  Marland Kitchen CATARACT EXTRACTION    . COCHLEAR IMPLANT Left 10/27/2015   Procedure: LEFT COCHLEAR IMPLANT;  Surgeon: Leta Baptist, MD;  Location: [facility];  Service: ENT;  Laterality: Left;  . INGUINAL HERNIA REPAIR Left 02/17/2014   Procedure: LEFT INGUINAL HERNIA REPAIR WITH MESH;  Surgeon: Harl Bowie, MD;  Location: [facility];  Service:  General;  Laterality: Left;  . INSERTION OF MESH N/A 02/17/2014   Procedure: INSERTION OF MESH;  Surgeon: Harl Bowie, MD;  Location: [facility];  Service: General;  Laterality: N/A;  . KNEE ARTHROSCOPY  2006   rt   . MELANOMA EXCISION     left leg  . PUBOVAGINAL SLING    . TEAR DUCT PROBING    . TUBAL LIGATION    . TYMPANOMASTOIDECTOMY Left 04/28/2015  . TYMPANOMASTOIDECTOMY Left 04/28/2015   Procedure: LEFT TYMPANOMASTOIDECTOMY;  Surgeon: Leta Baptist, MD;  Location:  [facility];  Service: ENT;  Laterality: Left;  Marland Kitchen VENA CAVA FILTER PLACEMENT  1997   [place] filter    Allergies  Allergen Reactions  . Adhesive [Tape] Rash    On on lower extremities       Medication List       Accurate as of 06/07/16 11:59 PM. Always use your most recent med list.          acetaminophen 650 MG CR tablet Commonly known as:  TYLENOL Take 650 mg by mouth 2 (two) times daily. Every 6 hours as needed for pain   b complex vitamins capsule Take 1 capsule by mouth daily.   calcium-vitamin D 500-200 MG-UNIT tablet Commonly known as:  OSCAL WITH D Take 1 tablet by mouth daily.   cholecalciferol 1000 units tablet Commonly known as:  VITAMIN D One daily for vitamin D supplement   diclofenac sodium 1 % Gel Commonly known as:  VOLTAREN Apply 2 g topically 3 (three) times daily. Apply to left shoulder.   Fluticasone-Salmeterol 500-50 MCG/DOSE Aepb Commonly known as:  ADVAIR DISKUS INHALE 1 PUFF EVERY 12 HOURS.   gabapentin 300 MG capsule Commonly known as:  NEURONTIN Take 300 mg by mouth 3 (three) times daily.   guaiFENesin 600 MG 12 hr tablet Commonly known as:  MUCINEX Take 600 mg by mouth 2 (two) times daily.   latanoprost 0.005 % ophthalmic solution Commonly known as:  XALATAN 1 drop. One drop both eyes at bedtime   levalbuterol 0.63 MG/3ML nebulizer solution Commonly known as:  XOPENEX Take 0.63 mg by nebulization every 6 (six) hours as needed for wheezing or shortness of breath.   omeprazole 20 MG capsule Commonly known as:  PRILOSEC Take 20 mg by mouth daily.   PREPARATION H EX Apply topically. Apply ointment for hemorrhoids four times a day as needed for discomfort   saccharomyces boulardii 250 MG capsule Commonly known as:  FLORASTOR Take 250 mg by mouth. Take one capsule once a day   SORE THROAT SPRAY 1.4 % Liqd Generic drug:  phenol Use as directed 1 spray in the mouth or throat as needed for throat irritation / pain.     sulfamethoxazole-trimethoprim 800-160 MG tablet Commonly known as:  BACTRIM DS,SEPTRA DS Take 1 tablet by mouth every Monday, Wednesday, and Friday. HOLD WHILE ON AUGMENTIN, THEN RESUME AS PREVIOUS   THERAVIM-M Tabs Take 1 tablet by mouth daily.   timolol 0.5 % ophthalmic solution Commonly known as:  BETIMOL Place 1 drop into the left eye daily with breakfast.   warfarin 4 MG tablet Commonly known as:  COUMADIN Take 4 mg by mouth daily.       Review of Systems  Constitutional: Negative for chills, diaphoresis and fever.       Frail and generally weak.  HENT: Positive for hearing loss (Deaf on left, hearing aid on right ) and nosebleeds. Negative for congestion, ear discharge, ear  pain and tinnitus.        Left cochlear implant in place and has a magnet that she applies to the left parietal scalp associated with the cochlear implant.  Eyes: Positive for discharge (OS) and redness (OS). Negative for pain.  Respiratory: Positive for cough and shortness of breath. Negative for wheezing (Intermittent).        History Wegener's granulomatosis. History of chronic sinusitis related to her Wegener's. Chronic cough and yellowish phlegm production   Cardiovascular: Positive for leg swelling. Negative for chest pain and palpitations.       Trace R+L foot/ankle, R>L  Gastrointestinal: Negative for abdominal pain, constipation, diarrhea and nausea.       History of GERD.  Endocrine: Negative for polydipsia.  Genitourinary: Positive for frequency. Negative for dysuria, flank pain, hematuria and urgency.       Frequency induced by furosemide. Episodes of incontinence of urine.  Musculoskeletal: Positive for arthralgias and gait problem. Negative for back pain, myalgias and neck pain.       Generalized weakness Left upper extremity pain, limited ROM of the left shoulder  Skin: Negative for rash.       2 mm scabbed and tender wound of the left parietal scalp.  Neurological: Positive for  weakness. Negative for dizziness, tremors, seizures and headaches.       Neuropathic leg/foot pain. Generalized weakness BLE  Hematological: Does not bruise/bleed easily.       Chronic anemia.  Psychiatric/Behavioral: Negative for hallucinations and suicidal ideas. The patient is nervous/anxious.        Husband is in SNF    Immunization History  Administered Date(s) Administered  . Influenza Split 03/04/2012, 03/11/2014, 04/01/2015  . Influenza,inj,Quad PF,36+ Mos 03/03/2013  . Influenza-Unspecified 04/01/2015, 04/19/2016  . PPD Test 05/25/2015  . Pneumococcal Conjugate-13 07/01/2015  . Pneumococcal Polysaccharide-23 09/02/2015  . Zoster 09/30/1994   Pertinent  Health Maintenance Due  Topic Date Due  . INFLUENZA VACCINE  Completed  . DEXA SCAN  Completed  . PNA vac Low Risk Adult  Completed   Fall Risk  12/09/2015 09/23/2015 09/09/2015 02/03/2015 12/04/2014  Falls in the past year? No Yes Yes Yes Yes  Number falls in past yr: - 2 or more 2 or more 2 or more 2 or more  Injury with Fall? - No No - No   Functional Status Survey:    Vitals:   06/07/16 1451  BP: 132/64  Pulse: 72  Resp: 16  Temp: 98.9 F (37.2 C)  Weight: 143 lb (64.9 kg)  Height: 5\' 1"  (1.549 m)   Body mass index is 27.02 kg/m. Physical Exam  Constitutional: She is oriented to person, place, and time. She appears well-developed. No distress.  Thin and frail. Weight stable  since 3/16  HENT:  Head: Normocephalic and atraumatic.  Nose: Nose normal.  Mouth/Throat: Oropharynx is clear and moist. No oropharyngeal exudate.  Bilateral hearing loss. Hearing aid - right ear Pending left cochlear implant  Eyes: EOM are normal. Pupils are equal, round, and reactive to light. Right eye exhibits no discharge. Left eye exhibits no discharge. No scleral icterus.  Left conjunctiva quite red and bloodshot. There is a whitish exudate as well.  Neck: Normal range of motion. Neck supple. No JVD present. No tracheal deviation  present. No thyromegaly present.  Cardiovascular: Normal rate, regular rhythm, normal heart sounds and intact distal pulses.   No murmur heard. Pulmonary/Chest: Effort normal. No stridor. No respiratory distress. She has no wheezes. She has  rales. She exhibits no tenderness.  Abdominal: Soft. Bowel sounds are normal. She exhibits no distension and no mass. There is no tenderness. There is no rebound and no guarding.  Musculoskeletal: Normal range of motion. She exhibits edema (trace) and tenderness.  R+L foot/ankle, R>L, trace to 1+ Left upper extremity pain. L shoulder limited ROM  Lymphadenopathy:    She has no cervical adenopathy.  Neurological: She is alert and oriented to person, place, and time. She has normal reflexes. No cranial nerve deficit. She exhibits normal muscle tone. Coordination normal.  The patient has history of neuropathy of bilateral lower extremities which resulted in generalized weakness and pain of legs, she is only able to ambulates a few steps with walker in her room and depend on w/c to go further. In order to maintain her quality of life and mobility, she needs wheelchair to travel to dinning room for meals and attend activities on the [place] where she resides. Due to her left upper extremity pain related to an injury occurred 4-5 years, she is unable to propel manual wheelchair or steer a scooter in her living space to maintain her mobility. Mrs Lindblom manages her own affairs and finances with minimal assistance. She has worked with therapy in the past and has been deemed to be safe to operate a power chair.    Skin: Skin is warm and dry. No rash noted. She is not diaphoretic. No erythema. No pallor.  Psychiatric: She has a normal mood and affect. Her behavior is normal. Judgment and thought content normal.    Labs reviewed:  Recent Labs  09/15/15 1342 11/26/15 02/08/16  NA 141 137 141  K 4.1 4.1 4.1  CL 109  --   --   CO2 23  --   --   GLUCOSE 97   --   --   BUN 13 13 10   CREATININE 0.87 0.8 0.8  CALCIUM 9.9  --   --     Recent Labs  08/12/15 11/26/15 02/08/16  AST 21 33 23  ALT 15 20 17   ALKPHOS 102 171* 127*    Recent Labs  09/23/15 0908  10/27/15 0711  12/02/15 1327 02/08/16 06/01/16 1247  WBC 3.6*  < > 4.4  < > 5.3 4.0 5.3  NEUTROABS 2.1  --   --   --  3.3  --  3.4  HGB 9.9*  < > 10.7*  < > 12.1 11.7* 12.9  HCT 32.2*  < > 34.2*  < > 37.0 37 39.6  MCV 80*  --  84.0  --  88  --  91  PLT 449*  < > 324  < > 303 256 276  < > = values in this interval not displayed. Lab Results  Component Value Date   TSH 1.56 05/13/2015   Lab Results  Component Value Date   HGBA1C 5.8 (H) 07/28/2014   No results found for: CHOL, HDL, LDLCALC, LDLDIRECT, TRIG, CHOLHDL  Significant Diagnostic Results in last 30 days:  No results found.  Assessment/Plan Intrinsic asthma 11/26/15 CXR mild patchy density in RUL compatible with pneumonia. 11/27/15 10 day course of Avelox. Chronic cough and clear phlegm production   Constipation Stable, continue Miralax.     GERD (gastroesophageal reflux disease) Stable, continue Omeprazole 20mg  daily   OA (optic atrophy) Multiple joints, pain is better since the motorized scooter, she ambulates with walker for a short distance, but her endurance has declined.   Peripheral neuropathy ([place]) Lower  extremities weakness, motorized w/c for mobility, continue Neurontin   Scoliosis (and kyphoscoliosis), idiopathic Stable  Anemia of chronic disease 10/05/15 Hgb 11.1 11/26/15 Hgb 12.3 02/08/16 Hgb 11.7 Continue Fe infusion  Edema Off diuretics. Only trace edema in ankles-no longer needing TED.      Long term current use of anticoagulant therapy INR goal 2-3, continue Coumadin  Urine incontinence 3-4x/night, no difficulty returning to sleep after bathroom trips.   Generalized anxiety disorder Prn Lorazepam is adequate for her      Family/ staff Communication: AL  Labs/tests  ordered:  none

## 2016-06-07 NOTE — Assessment & Plan Note (Signed)
10/05/15 Hgb 11.1 11/26/15 Hgb 12.3 02/08/16 Hgb 11.7 Continue Fe infusion

## 2016-06-07 NOTE — Assessment & Plan Note (Signed)
Off diuretics. Only trace edema in ankles-no longer needing TED.  

## 2016-06-07 NOTE — Assessment & Plan Note (Signed)
Multiple joints, pain is better since the motorized scooter, she ambulates with walker for a short distance, but her endurance has declined.

## 2016-06-07 NOTE — Assessment & Plan Note (Signed)
3-4x/night, no difficulty returning to sleep after bathroom trips.

## 2016-06-07 NOTE — Assessment & Plan Note (Signed)
11/26/15 CXR mild patchy density in RUL compatible with pneumonia. 11/27/15 10 day course of Avelox. Chronic cough and clear phlegm production

## 2016-06-07 NOTE — Assessment & Plan Note (Signed)
Stable, continue Miralax.  

## 2016-06-15 ENCOUNTER — Ambulatory Visit (HOSPITAL_BASED_OUTPATIENT_CLINIC_OR_DEPARTMENT_OTHER): Payer: Medicare Other

## 2016-06-15 VITALS — BP 155/94 | HR 99 | Temp 98.1°F | Resp 18

## 2016-06-15 DIAGNOSIS — D509 Iron deficiency anemia, unspecified: Secondary | ICD-10-CM | POA: Diagnosis not present

## 2016-06-15 DIAGNOSIS — D638 Anemia in other chronic diseases classified elsewhere: Secondary | ICD-10-CM

## 2016-06-15 MED ORDER — FERUMOXYTOL INJECTION 510 MG/17 ML
510.0000 mg | Freq: Once | INTRAVENOUS | Status: AC
  Administered 2016-06-15: 510 mg via INTRAVENOUS
  Filled 2016-06-15: qty 17

## 2016-06-15 MED ORDER — SODIUM CHLORIDE 0.9 % IV SOLN
Freq: Once | INTRAVENOUS | Status: AC
Start: 2016-06-15 — End: 2016-06-15
  Administered 2016-06-15: 12:00:00 via INTRAVENOUS

## 2016-06-15 NOTE — Patient Instructions (Signed)
Ferumoxytol injection What is this medicine? FERUMOXYTOL is an iron complex. Iron is used to make healthy red blood cells, which carry oxygen and nutrients throughout the body. This medicine is used to treat iron deficiency anemia in people with chronic kidney disease. COMMON BRAND NAME(S): Feraheme What should I tell my health care provider before I take this medicine? They need to know if you have any of these conditions: -anemia not caused by low iron levels -high levels of iron in the blood -magnetic resonance imaging (MRI) test scheduled -an unusual or allergic reaction to iron, other medicines, foods, dyes, or preservatives -pregnant or trying to get pregnant -breast-feeding How should I use this medicine? This medicine is for injection into a vein. It is given by a health care professional in a hospital or clinic setting. Talk to your pediatrician regarding the use of this medicine in children. Special care may be needed. What if I miss a dose? It is important not to miss your dose. Call your doctor or health care professional if you are unable to keep an appointment. What may interact with this medicine? This medicine may interact with the following medications: -other iron products What should I watch for while using this medicine? Visit your doctor or healthcare professional regularly. Tell your doctor or healthcare professional if your symptoms do not start to get better or if they get worse. You may need blood work done while you are taking this medicine. You may need to follow a special diet. Talk to your doctor. Foods that contain iron include: whole grains/cereals, dried fruits, beans, or peas, leafy green vegetables, and organ meats (liver, kidney). What side effects may I notice from receiving this medicine? Side effects that you should report to your doctor or health care professional as soon as possible: -allergic reactions like skin rash, itching or hives, swelling of the  face, lips, or tongue -breathing problems -changes in blood pressure -feeling faint or lightheaded, falls -fever or chills -flushing, sweating, or hot feelings -swelling of the ankles or feet Side effects that usually do not require medical attention (report to your doctor or health care professional if they continue or are bothersome): -diarrhea -headache -nausea, vomiting -stomach pain Where should I keep my medicine? This drug is given in a hospital or clinic and will not be stored at home.  2017 Elsevier/Gold Standard (2015-07-22 12:41:49)  

## 2016-06-21 ENCOUNTER — Non-Acute Institutional Stay: Payer: Medicare Other | Admitting: Nurse Practitioner

## 2016-06-21 ENCOUNTER — Encounter: Payer: Self-pay | Admitting: Nurse Practitioner

## 2016-06-21 DIAGNOSIS — G5793 Unspecified mononeuropathy of bilateral lower limbs: Secondary | ICD-10-CM

## 2016-06-21 DIAGNOSIS — K5900 Constipation, unspecified: Secondary | ICD-10-CM | POA: Diagnosis not present

## 2016-06-21 DIAGNOSIS — D638 Anemia in other chronic diseases classified elsewhere: Secondary | ICD-10-CM

## 2016-06-21 DIAGNOSIS — R609 Edema, unspecified: Secondary | ICD-10-CM | POA: Diagnosis not present

## 2016-06-21 DIAGNOSIS — I2782 Chronic pulmonary embolism: Secondary | ICD-10-CM

## 2016-06-21 DIAGNOSIS — M3130 Wegener's granulomatosis without renal involvement: Secondary | ICD-10-CM | POA: Diagnosis not present

## 2016-06-21 DIAGNOSIS — N393 Stress incontinence (female) (male): Secondary | ICD-10-CM | POA: Diagnosis not present

## 2016-06-21 DIAGNOSIS — K219 Gastro-esophageal reflux disease without esophagitis: Secondary | ICD-10-CM

## 2016-06-21 NOTE — Assessment & Plan Note (Signed)
Stable, continue Omeprazole 20mg daily.  

## 2016-06-21 NOTE — Assessment & Plan Note (Signed)
Iron infusion 06/15/16, update CBC in setting of urogenital bleed.

## 2016-06-21 NOTE — Assessment & Plan Note (Signed)
Stable, continue Miralax.  

## 2016-06-21 NOTE — Assessment & Plan Note (Signed)
Off diuretics. Only trace edema in ankles-no longer needing TED.  

## 2016-06-21 NOTE — Assessment & Plan Note (Signed)
Managed on chronic warfarin, PharmD Elliot Gault in [place]

## 2016-06-21 NOTE — Assessment & Plan Note (Signed)
3-4x/night, no difficulty returning to sleep after bathroom trips. Off Myrbetriq

## 2016-06-21 NOTE — Assessment & Plan Note (Addendum)
Chronic cough and clear phlegm production. Followed by Rheum (Ang, [facility]), for which she is on MTX and Bactrim H/o cavitary pulmonary densities H/o bronchial stenosis in upper lobes >> s/p balloon dilatation at [facility] with subjective improvement in breathing.  Abnormal density RUL ct chest 01/2013:  Appears inflammatory.  Abnormal mucosa in nose and upper airway that is felt to be scar/postinflammatory by ENT Lucia Gaskins) rather than Wegener's.  07/25/15 CXR no acute cardiopulmonary process 09/03/15 CXR no acute pathology is identified.  11/26/15 CXR mild patchy density in RUL compatible with pneumonia. 11/27/15 10 day course of Avelox.  Continue Advair, Duoneb, Mucinex.

## 2016-06-21 NOTE — Assessment & Plan Note (Signed)
Lower extremities weakness, motorized w/c for mobility, continue Neurontin   

## 2016-06-21 NOTE — Progress Notes (Signed)
Location:  [facility] Room Number: [id number] Place of Service:  ALF [phone]) Provider:  Meryle Pugmire, Manxie  NP  Jeanmarie Hubert, MD  Patient Care Team: Estill Dooms, MD as PCP - General (Internal Medicine) Unice Bailey, MD as Consulting Physician (Rheumatology) Rozetta Nunnery, MD as Consulting Physician (Otolaryngology) Marcial Pacas, MD as Consulting Physician (Neurology) Michel Bickers, MD as Consulting Physician (Infectious Diseases) Volanda Napoleon, MD as Consulting Physician (Oncology) Juanito Doom, MD as Consulting Physician (Pulmonary Disease) Tanda Rockers, MD as Consulting Physician (Pulmonary Disease) Estill Dooms, MD as Consulting Physician (Geriatric Medicine) Coralie Keens, MD as Consulting Physician (General Surgery) Daejah Klebba Otho Darner, NP as Nurse Practitioner (Nurse Practitioner)  Extended Emergency Contact Information Primary Emergency Contact: Haygood,Sandra Address: [address] [place] [address] Johnnette Litter of [place] Phone: [phone] Relation: Daughter Secondary Emergency Contact: Shappell,[name] Address: [address] [place]          [place], [place] [address] [place] Phone: [phone] Mobile Phone: [phone] Relation: Son  Code Status:  Full Code Goals of care: Advanced Directive information Advanced Directives 06/21/2016  Does Patient Have a Medical Advance Directive? Yes  Type of Paramedic of [place];Living will  Does patient want to make changes to medical advance directive? No - Patient declined  Copy of [place] in Chart? Yes  Would patient like information on creating a medical advance directive? -     Chief Complaint  Patient presents with  . Acute Visit    scant amount of blood seen on tissue from vaginal area    HPI:  Pt is a 82 y.o. female seen today for an acute visit for scant amount of blood seen urogenital, not sure  bleeding from bladder, vagina, rectum. Denied dysuria, constipation, abd discomfort, or diarrhea, she is afebrile, no chang in chronic cough. Takes Coumadin for PE   Hx of Anemia, Iron infusion 06/15/16 per Hematology. Peripheral neuropathy, w/c for mobility, takes Gabapentin 300mg  tid. GERD symptomatic free on Omeprazole 20mg  daily.    Past Medical History:  Diagnosis Date  . Acute respiratory failure with hypoxia ([place])   . Anemia   . Aspergillosis ([place])   . Bladder incontinence   . Blindness of one eye   . Cancer ([place])    melanoma   ([place])  . Chronic sinusitis   . Clotting disorder ([place])   . Difficult intubation    Difficult airway with intubation for ARF 07/22/14 (due to anterior larynx, also had mucous plug)  . Fatigue 12/09/2015  . Glaucoma   . Hearing loss in left ear    hearing aid both ears  . History of pulmonary embolism 1997  . Interstitial emphysema ([place])   . Left shoulder pain 12/09/2015  . Multiple allergies   . Neuropathy ([place])   . OA (osteoarthritis)   . Pneumonia 12/15  . Shortness of breath dyspnea   . Stroke [facility])    ??? blind left eye  . Wegener's granulomatosis ([place])    Past Surgical History:  Procedure Laterality Date  . bronchosocpy     pt. states she has had over 10 in last 20 yrs.  Marland Kitchen CATARACT EXTRACTION    . COCHLEAR IMPLANT Left 10/27/2015   Procedure: LEFT COCHLEAR IMPLANT;  Surgeon: Leta Baptist, MD;  Location: [facility];  Service: ENT;  Laterality: Left;  . INGUINAL HERNIA REPAIR Left  02/17/2014   Procedure: LEFT INGUINAL HERNIA REPAIR WITH MESH;  Surgeon: Harl Bowie, MD;  Location: [facility];  Service: General;  Laterality: Left;  . INSERTION OF MESH N/A 02/17/2014   Procedure: INSERTION OF MESH;  Surgeon: Harl Bowie, MD;  Location: [facility];  Service: General;  Laterality: N/A;  . KNEE ARTHROSCOPY  2006   rt   . MELANOMA EXCISION     left leg  . PUBOVAGINAL SLING    . TEAR DUCT PROBING    . TUBAL  LIGATION    . TYMPANOMASTOIDECTOMY Left 04/28/2015  . TYMPANOMASTOIDECTOMY Left 04/28/2015   Procedure: LEFT TYMPANOMASTOIDECTOMY;  Surgeon: Leta Baptist, MD;  Location: [facility];  Service: ENT;  Laterality: Left;  Marland Kitchen VENA CAVA FILTER PLACEMENT  1997   [place] filter    Allergies  Allergen Reactions  . Adhesive [Tape] Rash    On on lower extremities     Allergies as of 06/21/2016      Reactions   Adhesive [tape] Rash   On on lower extremities       Medication List       Accurate as of 06/21/16 11:59 PM. Always use your most recent med list.          acetaminophen 650 MG CR tablet Commonly known as:  TYLENOL Take 650 mg by mouth 2 (two) times daily. Every 6 hours as needed for pain   b complex vitamins capsule Take 1 capsule by mouth daily.   calcium-vitamin D 500-200 MG-UNIT tablet Commonly known as:  OSCAL WITH D Take 1 tablet by mouth daily.   cholecalciferol 1000 units tablet Commonly known as:  VITAMIN D One daily for vitamin D supplement   diclofenac sodium 1 % Gel Commonly known as:  VOLTAREN Apply 2 g topically 3 (three) times daily. Apply to left shoulder.   Fluticasone-Salmeterol 500-50 MCG/DOSE Aepb Commonly known as:  ADVAIR DISKUS INHALE 1 PUFF EVERY 12 HOURS.   gabapentin 300 MG capsule Commonly known as:  NEURONTIN Take 300 mg by mouth 3 (three) times daily.   guaiFENesin 600 MG 12 hr tablet Commonly known as:  MUCINEX Take 600 mg by mouth 2 (two) times daily.   latanoprost 0.005 % ophthalmic solution Commonly known as:  XALATAN 1 drop. One drop both eyes at bedtime   levalbuterol 0.63 MG/3ML nebulizer solution Commonly known as:  XOPENEX Take 0.63 mg by nebulization every 6 (six) hours as needed for wheezing or shortness of breath.   omeprazole 20 MG capsule Commonly known as:  PRILOSEC Take 20 mg by mouth daily.   PREPARATION H EX Apply topically. Apply ointment for hemorrhoids four times a day as needed for discomfort     saccharomyces boulardii 250 MG capsule Commonly known as:  FLORASTOR Take 250 mg by mouth. Take one capsule once a day   SORE THROAT SPRAY 1.4 % Liqd Generic drug:  phenol Use as directed 1 spray in the mouth or throat as needed for throat irritation / pain.   sulfamethoxazole-trimethoprim 800-160 MG tablet Commonly known as:  BACTRIM DS,SEPTRA DS Take 1 tablet by mouth every Monday, Wednesday, and Friday. HOLD WHILE ON AUGMENTIN, THEN RESUME AS PREVIOUS   THERAVIM-M Tabs Take 1 tablet by mouth daily.   timolol 0.5 % ophthalmic solution Commonly known as:  BETIMOL Place 1 drop into the left eye daily with breakfast.   warfarin 4 MG tablet Commonly known as:  COUMADIN Take 4 mg by mouth daily.  Review of Systems  Constitutional: Negative for chills, diaphoresis and fever.       Frail and generally weak.  HENT: Positive for hearing loss (Deaf on left, hearing aid on right ) and nosebleeds. Negative for congestion, ear discharge, ear pain and tinnitus.        Left cochlear implant in place and has a magnet that she applies to the left parietal scalp associated with the cochlear implant.  Eyes: Positive for discharge (OS) and redness (OS). Negative for pain.  Respiratory: Positive for cough and shortness of breath. Negative for wheezing (Intermittent).        History Wegener's granulomatosis. History of chronic sinusitis related to her Wegener's. Chronic cough and yellowish phlegm production   Cardiovascular: Positive for leg swelling. Negative for chest pain and palpitations.       Trace R+L foot/ankle, R>L  Gastrointestinal: Negative for abdominal pain, constipation, diarrhea and nausea.       History of GERD.  Endocrine: Negative for polydipsia.  Genitourinary: Positive for frequency. Negative for dysuria, flank pain, hematuria and urgency.       Frequency induced by furosemide. Episodes of incontinence of urine.  Musculoskeletal: Positive for arthralgias and gait  problem. Negative for back pain, myalgias and neck pain.       Generalized weakness Left upper extremity pain, limited ROM of the left shoulder  Skin: Negative for rash.       2 mm scabbed and tender wound of the left parietal scalp.  Neurological: Positive for weakness. Negative for dizziness, tremors, seizures and headaches.       Neuropathic leg/foot pain. Generalized weakness BLE  Hematological: Does not bruise/bleed easily.       Chronic anemia.  Psychiatric/Behavioral: Negative for hallucinations and suicidal ideas. The patient is nervous/anxious.        Husband is in SNF    Immunization History  Administered Date(s) Administered  . Influenza Split 03/04/2012, 03/11/2014, 04/01/2015  . Influenza,inj,Quad PF,36+ Mos 03/03/2013  . Influenza-Unspecified 04/01/2015, 04/19/2016  . PPD Test 05/25/2015  . Pneumococcal Conjugate-13 07/01/2015  . Pneumococcal Polysaccharide-23 09/02/2015  . Zoster 09/30/1994   Pertinent  Health Maintenance Due  Topic Date Due  . INFLUENZA VACCINE  Completed  . DEXA SCAN  Completed  . PNA vac Low Risk Adult  Completed   Fall Risk  12/09/2015 09/23/2015 09/09/2015 02/03/2015 12/04/2014  Falls in the past year? No Yes Yes Yes Yes  Number falls in past yr: - 2 or more 2 or more 2 or more 2 or more  Injury with Fall? - No No - No   Functional Status Survey:    Vitals:   06/21/16 1024  BP: 130/68  Pulse: 84  Resp: (!) 22  Temp: 99.4 F (37.4 C)  Weight: 143 lb (64.9 kg)  Height: 5\' 1"  (1.549 m)   Body mass index is 27.02 kg/m. Physical Exam  Constitutional: She is oriented to person, place, and time. She appears well-developed. No distress.  Thin and frail. Weight stable  since 3/16  HENT:  Head: Normocephalic and atraumatic.  Nose: Nose normal.  Mouth/Throat: Oropharynx is clear and moist. No oropharyngeal exudate.  Bilateral hearing loss. Hearing aid - right ear Pending left cochlear implant  Eyes: EOM are normal. Pupils are equal, round,  and reactive to light. Right eye exhibits no discharge. Left eye exhibits no discharge. No scleral icterus.  Left conjunctiva quite red and bloodshot. There is a whitish exudate as well.  Neck: Normal range of motion.  Neck supple. No JVD present. No tracheal deviation present. No thyromegaly present.  Cardiovascular: Normal rate, regular rhythm, normal heart sounds and intact distal pulses.   No murmur heard. Pulmonary/Chest: Effort normal. No stridor. No respiratory distress. She has no wheezes. She has rales. She exhibits no tenderness.  Abdominal: Soft. Bowel sounds are normal. She exhibits no distension and no mass. There is no tenderness. There is no rebound and no guarding.  Musculoskeletal: Normal range of motion. She exhibits edema (trace) and tenderness.  R+L foot/ankle, R>L, trace to 1+ Left upper extremity pain. L shoulder limited ROM  Lymphadenopathy:    She has no cervical adenopathy.  Neurological: She is alert and oriented to person, place, and time. She has normal reflexes. No cranial nerve deficit. She exhibits normal muscle tone. Coordination normal.  The patient has history of neuropathy of bilateral lower extremities which resulted in generalized weakness and pain of legs, she is only able to ambulates a few steps with walker in her room and depend on w/c to go further. In order to maintain her quality of life and mobility, she needs wheelchair to travel to dinning room for meals and attend activities on the [place] where she resides. Due to her left upper extremity pain related to an injury occurred 4-5 years, she is unable to propel manual wheelchair or steer a scooter in her living space to maintain her mobility. Mrs Guck manages her own affairs and finances with minimal assistance. She has worked with therapy in the past and has been deemed to be safe to operate a power chair.    Skin: Skin is warm and dry. No rash noted. She is not diaphoretic. No erythema. No  pallor.  Psychiatric: She has a normal mood and affect. Her behavior is normal. Judgment and thought content normal.    Labs reviewed:  Recent Labs  09/15/15 1342 11/26/15 02/08/16  NA 141 137 141  K 4.1 4.1 4.1  CL 109  --   --   CO2 23  --   --   GLUCOSE 97  --   --   BUN 13 13 10   CREATININE 0.87 0.8 0.8  CALCIUM 9.9  --   --     Recent Labs  08/12/15 11/26/15 02/08/16  AST 21 33 23  ALT 15 20 17   ALKPHOS 102 171* 127*    Recent Labs  09/23/15 0908  10/27/15 0711  12/02/15 1327 02/08/16 06/01/16 1247  WBC 3.6*  < > 4.4  < > 5.3 4.0 5.3  NEUTROABS 2.1  --   --   --  3.3  --  3.4  HGB 9.9*  < > 10.7*  < > 12.1 11.7* 12.9  HCT 32.2*  < > 34.2*  < > 37.0 37 39.6  MCV 80*  --  84.0  --  88  --  91  PLT 449*  < > 324  < > 303 256 276  < > = values in this interval not displayed. Lab Results  Component Value Date   TSH 1.56 05/13/2015   Lab Results  Component Value Date   HGBA1C 5.8 (H) 07/28/2014   No results found for: CHOL, HDL, LDLCALC, LDLDIRECT, TRIG, CHOLHDL  Significant Diagnostic Results in last 30 days:  No results found.  Assessment/Plan WEGENERS GRANULOMATOSIS Chronic cough and clear phlegm production. Followed by Rheum ([facility]), for which she is on MTX and Bactrim H/o cavitary pulmonary densities H/o bronchial stenosis in upper lobes >>  s/p balloon dilatation at [facility] with subjective improvement in breathing.  Abnormal density RUL ct chest 01/2013:  Appears inflammatory.  Abnormal mucosa in nose and upper airway that is felt to be scar/postinflammatory by ENT Lucia Gaskins) rather than Wegener's.  07/25/15 CXR no acute cardiopulmonary process 09/03/15 CXR no acute pathology is identified.  11/26/15 CXR mild patchy density in RUL compatible with pneumonia. 11/27/15 10 day course of Avelox.  Continue Advair, Duoneb, Mucinex.     Pulmonary embolism ([place]) Managed on chronic warfarin, PharmD Elliot Gault in [place]   Constipation Stable,  continue Miralax.    GERD (gastroesophageal reflux disease) Stable, continue Omeprazole 20mg  daily   Mononeuropathy of both lower extremities Lower extremities weakness, motorized w/c for mobility, continue Neurontin  Anemia of chronic disease Iron infusion 06/15/16, update CBC in setting of urogenital bleed.   Edema Off diuretics. Only trace edema in ankles-no longer needing TED.    Stress incontinence 3-4x/night, no difficulty returning to sleep after bathroom trips. Off Myrbetriq     Family/ staff Communication: AL  Labs/tests ordered:  CBC CMP PT/INR, UA C/S

## 2016-06-29 ENCOUNTER — Encounter: Payer: Self-pay | Admitting: Internal Medicine

## 2016-06-29 ENCOUNTER — Non-Acute Institutional Stay: Payer: Medicare Other | Admitting: Internal Medicine

## 2016-06-29 DIAGNOSIS — J029 Acute pharyngitis, unspecified: Secondary | ICD-10-CM | POA: Diagnosis not present

## 2016-06-29 DIAGNOSIS — L97521 Non-pressure chronic ulcer of other part of left foot limited to breakdown of skin: Secondary | ICD-10-CM | POA: Diagnosis not present

## 2016-06-29 DIAGNOSIS — L97529 Non-pressure chronic ulcer of other part of left foot with unspecified severity: Secondary | ICD-10-CM | POA: Insufficient documentation

## 2016-06-29 LAB — BASIC METABOLIC PANEL
BUN: 13 mg/dL (ref 4–21)
Creatinine: 0.9 mg/dL (ref <=1.1)
GLUCOSE: 131 mg/dL
Potassium: 4.1 mmol/L (ref 3.4–5.3)
Sodium: 136 mmol/L — AB (ref 137–147)

## 2016-06-29 LAB — CBC AND DIFFERENTIAL
HEMATOCRIT: 35 % — AB (ref 36–46)
Hemoglobin: 11.3 g/dL — AB (ref 12.0–16.0)
Platelets: 228 10*3/uL (ref 150–399)
WBC: 7.3 10^3/mL

## 2016-06-29 MED ORDER — CEPHALEXIN 500 MG PO CAPS: ORAL_CAPSULE | ORAL | 1 refills | Status: DC

## 2016-06-29 NOTE — Progress Notes (Signed)
Progress Note    Location:  [facility] Room Number: [phone] Place of Service:  ALF [phone]) Provider:  Jeanmarie Hubert, MD  Patient Care Team: Estill Dooms, MD as PCP - General (Internal Medicine) Unice Bailey, MD as Consulting Physician (Rheumatology) Rozetta Nunnery, MD as Consulting Physician (Otolaryngology) Marcial Pacas, MD as Consulting Physician (Neurology) Michel Bickers, MD as Consulting Physician (Infectious Diseases) Volanda Napoleon, MD as Consulting Physician (Oncology) Juanito Doom, MD as Consulting Physician (Pulmonary Disease) Tanda Rockers, MD as Consulting Physician (Pulmonary Disease) Estill Dooms, MD as Consulting Physician (Geriatric Medicine) Coralie Keens, MD as Consulting Physician (General Surgery) Man Otho Darner, NP as Nurse Practitioner (Nurse Practitioner)  Extended Emergency Contact Information Primary Emergency Contact: Haygood,Sandra Address: [address] [place] [address] Johnnette Litter of [place] Phone: [phone] Relation: Daughter Secondary Emergency Contact: Beer,[name] Address: [address] [place]          [place], [place] [address] [place] of [place] Phone: [phone] Mobile Phone: [phone] Relation: Son  Code Status:  Full code Goals of care: Advanced Directive information Advanced Directives 06/29/2016  Does Patient Have a Medical Advance Directive? Yes  Type of Paramedic of [place];Living will  Does patient want to make changes to medical advance directive? -  Copy of [place] in Chart? Yes  Would patient like information on creating a medical advance directive? -     Chief Complaint  Patient presents with  . Acute Visit    fever 102.6    HPI:  Pt is a 82 y.o. female seen today for an acute visit for evaluation of a fever. She says her throat hurts when she swallows. She is not nauseous. She has a chronic  cough and does not think it is any worse than usual. CXR and lab was ordered earlier today and is pending.  She has a very sore area of ulceration at the medial side of the 2nd toe . This toe overlaps the great toe. She has been using Neosporin and cotton balls to separate the toes. She says it has been present about 2 months.   Past Medical History:  Diagnosis Date  . Acute respiratory failure with hypoxia ([place])   . Anemia   . Aspergillosis ([place])   . Bladder incontinence   . Blindness of one eye   . Cancer ([facility] [facility])    melanoma   ([place])  . Chronic sinusitis   . Clotting disorder ([facility])   . Difficult intubation    Difficult airway with intubation for ARF 07/22/14 (due to anterior larynx, also had mucous plug)  . Fatigue 12/09/2015  . Glaucoma   . Hearing loss in left ear    hearing aid both ears  . History of pulmonary embolism 1997  . Interstitial emphysema ([place])   . Left shoulder pain 12/09/2015  . Multiple allergies   . Neuropathy ([place])   . OA (osteoarthritis)   . Pneumonia 12/15  . Shortness of breath dyspnea   . Stroke [facility])    ??? blind left eye  . Wegener's granulomatosis ([place])    Past Surgical History:  Procedure Laterality Date  . bronchosocpy     pt. states she has had over 10 in last 20 yrs.  Marland Kitchen CATARACT EXTRACTION    . COCHLEAR IMPLANT Left 10/27/2015   Procedure: LEFT COCHLEAR IMPLANT;  Surgeon: Leta Baptist,  MD;  Location: [facility];  Service: ENT;  Laterality: Left;  . INGUINAL HERNIA REPAIR Left 02/17/2014   Procedure: LEFT INGUINAL HERNIA REPAIR WITH MESH;  Surgeon: Harl Bowie, MD;  Location: [facility];  Service: General;  Laterality: Left;  . INSERTION OF MESH N/A 02/17/2014   Procedure: INSERTION OF MESH;  Surgeon: Harl Bowie, MD;  Location: [facility];  Service: General;  Laterality: N/A;  . KNEE ARTHROSCOPY  2006   rt   . MELANOMA EXCISION     left leg  . PUBOVAGINAL SLING    . TEAR DUCT PROBING    . TUBAL LIGATION    .  TYMPANOMASTOIDECTOMY Left 04/28/2015  . TYMPANOMASTOIDECTOMY Left 04/28/2015   Procedure: LEFT TYMPANOMASTOIDECTOMY;  Surgeon: Leta Baptist, MD;  Location: [facility];  Service: ENT;  Laterality: Left;  Marland Kitchen VENA CAVA FILTER PLACEMENT  1997   [place] filter    Allergies  Allergen Reactions  . Adhesive [Tape] Rash    On on lower extremities     Allergies as of 06/29/2016      Reactions   Adhesive [tape] Rash   On on lower extremities       Medication List       Accurate as of 06/29/16  3:56 PM. Always use your most recent med list.          acetaminophen 650 MG CR tablet Commonly known as:  TYLENOL Take 650 mg by mouth 2 (two) times daily. Take 2 tablets once a day 8:00 pm   b complex vitamins capsule Take 1 capsule by mouth daily.   calcium-vitamin D 500-200 MG-UNIT tablet Commonly known as:  OSCAL WITH D Take 1 tablet by mouth daily.   cholecalciferol 1000 units tablet Commonly known as:  VITAMIN D One daily for vitamin D supplement   diclofenac sodium 1 % Gel Commonly known as:  VOLTAREN Apply 2 g topically 3 (three) times daily. Apply to left shoulder.   Fluticasone-Salmeterol 500-50 MCG/DOSE Aepb Commonly known as:  ADVAIR DISKUS INHALE 1 PUFF EVERY 12 HOURS.   gabapentin 300 MG capsule Commonly known as:  NEURONTIN Take 300 mg by mouth 3 (three) times daily.   guaiFENesin 600 MG 12 hr tablet Commonly known as:  MUCINEX Take 600 mg by mouth 2 (two) times daily.   latanoprost 0.005 % ophthalmic solution Commonly known as:  XALATAN 1 drop. One drop both eyes at bedtime   levalbuterol 0.63 MG/3ML nebulizer solution Commonly known as:  XOPENEX Take 0.63 mg by nebulization every 6 (six) hours as needed for wheezing or shortness of breath.   omeprazole 20 MG capsule Commonly known as:  PRILOSEC Take 20 mg by mouth daily.   PREPARATION H EX Apply topically. Apply ointment for hemorrhoids four times a day as needed for discomfort   saccharomyces boulardii  250 MG capsule Commonly known as:  FLORASTOR Take 250 mg by mouth. Take one capsule once a day   SORE THROAT SPRAY 1.4 % Liqd Generic drug:  phenol Use as directed 1 spray in the mouth or throat as needed for throat irritation / pain.   sulfamethoxazole-trimethoprim 800-160 MG tablet Commonly known as:  BACTRIM DS,SEPTRA DS Take 1 tablet by mouth every Monday, Wednesday, and Friday. HOLD WHILE ON AUGMENTIN, THEN RESUME AS PREVIOUS   THERAVIM-M Tabs Take 1 tablet by mouth daily.   timolol 0.5 % ophthalmic solution Commonly known as:  BETIMOL Place 1 drop into the left eye daily with  breakfast.   warfarin 4 MG tablet Commonly known as:  COUMADIN Take 4 mg by mouth daily.       Review of Systems  Constitutional: Positive for fever. Negative for chills and diaphoresis.       Frail and generally weak.  HENT: Positive for hearing loss (Deaf on left, hearing aid on right ), nosebleeds and sore throat. Negative for congestion, ear discharge, ear pain and tinnitus.        Left cochlear implant in place and has a magnet that she applies to the left parietal scalp associated with the cochlear implant.  Eyes: Positive for discharge (OS) and redness (OS). Negative for pain.  Respiratory: Positive for cough and shortness of breath. Negative for wheezing (Intermittent).        History Wegener's granulomatosis. History of chronic sinusitis related to her Wegener's. Chronic cough and yellowish phlegm production   Cardiovascular: Positive for leg swelling. Negative for chest pain and palpitations.       Trace R+L foot/ankle, R>L  Gastrointestinal: Negative for abdominal pain, constipation, diarrhea and nausea.       History of GERD.  Endocrine: Negative for polydipsia.  Genitourinary: Positive for frequency. Negative for dysuria, flank pain, hematuria and urgency.       Frequency induced by furosemide. Episodes of incontinence of urine.  Musculoskeletal: Positive for arthralgias and gait  problem. Negative for back pain, myalgias and neck pain.       Generalized weakness Left upper extremity pain, limited ROM of the left shoulder  Skin: Positive for wound. Negative for rash.       Painfu uUlcer of the left 2nd toe medially.  Neurological: Positive for weakness. Negative for dizziness, tremors, seizures and headaches.       Neuropathic leg/foot pain. Generalized weakness BLE  Hematological: Does not bruise/bleed easily.       Chronic anemia.  Psychiatric/Behavioral: Negative for hallucinations and suicidal ideas. The patient is nervous/anxious.        Husband is in SNF    Immunization History  Administered Date(s) Administered  . Influenza Split 03/04/2012, 03/11/2014, 04/01/2015  . Influenza,inj,Quad PF,36+ Mos 03/03/2013  . Influenza-Unspecified 04/01/2015, 04/19/2016  . PPD Test 05/25/2015  . Pneumococcal Conjugate-13 07/01/2015  . Pneumococcal Polysaccharide-23 09/02/2015  . Zoster 09/30/1994   Pertinent  Health Maintenance Due  Topic Date Due  . INFLUENZA VACCINE  Completed  . DEXA SCAN  Completed  . PNA vac Low Risk Adult  Completed   Fall Risk  12/09/2015 09/23/2015 09/09/2015 02/03/2015 12/04/2014  Falls in the past year? No Yes Yes Yes Yes  Number falls in past yr: - 2 or more 2 or more 2 or more 2 or more  Injury with Fall? - No No - No   Functional Status Survey:    Vitals:   06/29/16 1543  BP: 120/70  Pulse: 100  Resp: (!) 26  Temp: (!) 102.6 F (39.2 C)  SpO2: 93%  Weight: 143 lb (64.9 kg)  Height: 5\' 1"  (1.549 m)   Body mass index is 27.02 kg/m. Physical Exam  Constitutional: She is oriented to person, place, and time. She appears well-developed. No distress.  Thin and frail. Weight stable  since 3/16  HENT:  Head: Normocephalic and atraumatic.  Nose: Nose normal.  Mouth/Throat: No oropharyngeal exudate.  Bilateral hearing loss. Hearing aid - right ear Pending left cochlear implant. Erythema of the pharynx.  Eyes: EOM are normal. Pupils  are equal, round, and reactive to light. Right eye  exhibits no discharge. Left eye exhibits no discharge. No scleral icterus.  Left conjunctiva quite red and bloodshot. There is a whitish exudate as well.  Neck: Normal range of motion. Neck supple. No JVD present. No tracheal deviation present. No thyromegaly present.  Cardiovascular: Normal rate, regular rhythm, normal heart sounds and intact distal pulses.   No murmur heard. Pulmonary/Chest: Effort normal. No stridor. No respiratory distress. She has wheezes. She has rales. She exhibits no tenderness.  Abdominal: Soft. Bowel sounds are normal. She exhibits no distension and no mass. There is no tenderness. There is no rebound and no guarding.  Musculoskeletal: Normal range of motion. She exhibits edema (trace) and tenderness.  R+L foot/ankle, R>L, trace to 1+ Left upper extremity pain. L shoulder limited ROM  Lymphadenopathy:    She has no cervical adenopathy.  Neurological: She is alert and oriented to person, place, and time. She has normal reflexes. No cranial nerve deficit. She exhibits normal muscle tone. Coordination normal.  The patient has history of neuropathy of bilateral lower extremities which resulted in generalized weakness and pain of legs, she is only able to ambulates a few steps with walker in her room and depend on w/c to go further. In order to maintain her quality of life and mobility, she needs wheelchair to travel to dinning room for meals and attend activities on the [place] where she resides. Due to her left upper extremity pain related to an injury occurred 4-5 years, she is unable to propel manual wheelchair or steer a scooter in her living space to maintain her mobility. Mrs Skov manages her own affairs and finances with minimal assistance. She has worked with therapy in the past and has been deemed to be safe to operate a power chair.    Skin: Skin is warm and dry. No rash noted. She is not diaphoretic. No  erythema. No pallor.  Painful ulcer of the left 2nd toe medially.Left 2nd toe overlaps the great toe.  Psychiatric: She has a normal mood and affect. Her behavior is normal. Judgment and thought content normal.    Labs reviewed:  Recent Labs  09/15/15 1342 11/26/15 02/08/16  NA 141 137 141  K 4.1 4.1 4.1  CL 109  --   --   CO2 23  --   --   GLUCOSE 97  --   --   BUN 13 13 10   CREATININE 0.87 0.8 0.8  CALCIUM 9.9  --   --     Recent Labs  08/12/15 11/26/15 02/08/16  AST 21 33 23  ALT 15 20 17   ALKPHOS 102 171* 127*    Recent Labs  09/23/15 0908  10/27/15 0711  12/02/15 1327 02/08/16 06/01/16 1247  WBC 3.6*  < > 4.4  < > 5.3 4.0 5.3  NEUTROABS 2.1  --   --   --  3.3  --  3.4  HGB 9.9*  < > 10.7*  < > 12.1 11.7* 12.9  HCT 32.2*  < > 34.2*  < > 37.0 37 39.6  MCV 80*  --  84.0  --  88  --  91  PLT 449*  < > 324  < > 303 256 276  < > = values in this interval not displayed. Lab Results  Component Value Date   TSH 1.56 05/13/2015   Lab Results  Component Value Date   HGBA1C 5.8 (H) 07/28/2014    Assessment/Plan 1. Acute pharyngitis, unspecified etiology - cephALEXin (KEFLEX) 500  MG capsule; One capsule 3 times daily to treat infection  Dispense: 21 capsule; Refill: 1 -INR on 07/03/16. Patient is chronically anticoagulated with warfarin.  2. Skin ulcer of second toe of left foot, limited to breakdown of skin (HCC) - cephALEXin (KEFLEX) 500 MG capsule; One capsule 3 times daily to treat infection  Dispense: 21 capsule; Refill: 1

## 2016-07-05 ENCOUNTER — Encounter: Payer: Self-pay | Admitting: Nurse Practitioner

## 2016-07-05 ENCOUNTER — Other Ambulatory Visit: Payer: Self-pay | Admitting: *Deleted

## 2016-07-05 DIAGNOSIS — N390 Urinary tract infection, site not specified: Secondary | ICD-10-CM | POA: Insufficient documentation

## 2016-07-13 ENCOUNTER — Non-Acute Institutional Stay: Payer: Medicare Other | Admitting: Nurse Practitioner

## 2016-07-13 ENCOUNTER — Encounter: Payer: Self-pay | Admitting: Nurse Practitioner

## 2016-07-13 DIAGNOSIS — D638 Anemia in other chronic diseases classified elsewhere: Secondary | ICD-10-CM

## 2016-07-13 DIAGNOSIS — K5900 Constipation, unspecified: Secondary | ICD-10-CM

## 2016-07-13 DIAGNOSIS — I2782 Chronic pulmonary embolism: Secondary | ICD-10-CM | POA: Diagnosis not present

## 2016-07-13 DIAGNOSIS — M3130 Wegener's granulomatosis without renal involvement: Secondary | ICD-10-CM | POA: Diagnosis not present

## 2016-07-13 DIAGNOSIS — R609 Edema, unspecified: Secondary | ICD-10-CM | POA: Diagnosis not present

## 2016-07-13 DIAGNOSIS — F411 Generalized anxiety disorder: Secondary | ICD-10-CM

## 2016-07-13 DIAGNOSIS — S9001XA Contusion of right ankle, initial encounter: Secondary | ICD-10-CM

## 2016-07-13 DIAGNOSIS — K219 Gastro-esophageal reflux disease without esophagitis: Secondary | ICD-10-CM | POA: Diagnosis not present

## 2016-07-13 DIAGNOSIS — L97521 Non-pressure chronic ulcer of other part of left foot limited to breakdown of skin: Secondary | ICD-10-CM

## 2016-07-13 DIAGNOSIS — G600 Hereditary motor and sensory neuropathy: Secondary | ICD-10-CM | POA: Diagnosis not present

## 2016-07-13 NOTE — Assessment & Plan Note (Signed)
Iron infusion 06/15/16, Hgb 11.3 06/29/16

## 2016-07-13 NOTE — Assessment & Plan Note (Signed)
Off diuretics. Only trace edema in ankles-no longer needing TED.  

## 2016-07-13 NOTE — Assessment & Plan Note (Signed)
Able to be off prn Lorazepam

## 2016-07-13 NOTE — Assessment & Plan Note (Signed)
stable, on Septra DS MWF indefinitely, Advair, Xopenex prn, Mucinex bid.

## 2016-07-13 NOTE — Assessment & Plan Note (Signed)
Managed on chronic warfarin, PharmD Elliot Gault in [place]

## 2016-07-13 NOTE — Progress Notes (Signed)
Location:  [facility] Room Number: [id number] Place of Service:  ALF [id number]) Provider:  Merland Holness, Manxie  NP  Jeanmarie Hubert, MD  Patient Care Team: Estill Dooms, MD as PCP - General (Internal Medicine) Unice Bailey, MD as Consulting Physician (Rheumatology) Rozetta Nunnery, MD as Consulting Physician (Otolaryngology) Marcial Pacas, MD as Consulting Physician (Neurology) Michel Bickers, MD as Consulting Physician (Infectious Diseases) Volanda Napoleon, MD as Consulting Physician (Oncology) Juanito Doom, MD as Consulting Physician (Pulmonary Disease) Tanda Rockers, MD as Consulting Physician (Pulmonary Disease) Estill Dooms, MD as Consulting Physician (Geriatric Medicine) Coralie Keens, MD as Consulting Physician (General Surgery) Ruben Pyka Otho Darner, NP as Nurse Practitioner (Nurse Practitioner)  Extended Emergency Contact Information Primary Emergency Contact: Haygood,Sandra Address: [address] [place] [address] [place]nette L[place] of [place] Phone: [phone] Relation: Daughter Secondary Emergency Contact: Murnane,[name] Address: [address] [place]          [place], [place] [address] [place] Phone: [phone] Mobile Phone: [phone] Relation: Son  Code Status:  Full Code Goals of care: Advanced Directive information Advanced Directives 07/13/2016  Does Patient Have a Medical Advance Directive? Yes  Type of Paramedic of [place];Living will  Does patient want to make changes to medical advance directive? No - Patient declined  Copy of [place] in Chart? Yes  Would patient like information on creating a medical advance directive? -     Chief Complaint  Patient presents with  . Acute Visit    Bruise on (Rt) ankle, that did'nt heal well before when she had a ulcer there.    HPI:  Pt is a 83 y.o. female seen today for an acute visit for bruise R ankle, able to  bear weight  Hx of Anemia, Iron infusion 06/15/16 per Hematology. Peripheral neuropathy, w/c for mobility, takes Gabapentin 300mg  tid. GERD symptomatic free on Omeprazole 20mg  daily. Takes Coumadin for PE. Wegeners Granulomatosis, stable, on Septra DS MWF indefinitely, Advair, Xopenex prn, Mucinex bid.   Past Medical History:  Diagnosis Date  . Acute respiratory failure with hypoxia ([place])   . Anemia   . Aspergillosis ([place])   . Bladder incontinence   . Blindness of one eye   . Cancer ([place])    melanoma   (chemo)  . Chronic sinusitis   . Clotting disorder ([place])   . Difficult intubation    Difficult airway with intubation for ARF 07/22/14 (due to anterior larynx, also had mucous plug)  . Fatigue 12/09/2015  . Glaucoma   . Hearing loss in left ear    hearing aid both ears  . History of pulmonary embolism 1997  . Interstitial emphysema ([place])   . Left shoulder pain 12/09/2015  . Multiple allergies   . Neuropathy ([place])   . OA (osteoarthritis)   . Pneumonia 12/15  . Shortness of breath dyspnea   . Stroke [facility])    ??? blind left eye  . Wegener's granulomatosis ([place])    Past Surgical History:  Procedure Laterality Date  . bronchosocpy     pt. states she has had over 10 in last 20 yrs.  Marland Kitchen CATARACT EXTRACTION    . COCHLEAR IMPLANT Left 10/27/2015   Procedure: LEFT COCHLEAR IMPLANT;  Surgeon: Leta Baptist, MD;  Location: [facility];  Service: ENT;  Laterality: Left;  . INGUINAL HERNIA REPAIR Left 02/17/2014   Procedure: LEFT  INGUINAL HERNIA REPAIR WITH MESH;  Surgeon: Harl Bowie, MD;  Location: [facility];  Service: General;  Laterality: Left;  . INSERTION OF MESH N/A 02/17/2014   Procedure: INSERTION OF MESH;  Surgeon: Harl Bowie, MD;  Location: [facility];  Service: General;  Laterality: N/A;  . KNEE ARTHROSCOPY  2006   rt   . MELANOMA EXCISION     left leg  . PUBOVAGINAL SLING    . TEAR DUCT PROBING    . TUBAL LIGATION    . TYMPANOMASTOIDECTOMY Left  04/28/2015  . TYMPANOMASTOIDECTOMY Left 04/28/2015   Procedure: LEFT TYMPANOMASTOIDECTOMY;  Surgeon: Leta Baptist, MD;  Location: [facility];  Service: ENT;  Laterality: Left;  Marland Kitchen VENA CAVA FILTER PLACEMENT  1997   [place] filter    Allergies  Allergen Reactions  . Adhesive [Tape] Rash    On on lower extremities     Allergies as of 07/13/2016      Reactions   Adhesive [tape] Rash   On on lower extremities       Medication List       Accurate as of 07/13/16 12:16 PM. Always use your most recent med list.          acetaminophen 650 MG CR tablet Commonly known as:  TYLENOL Take 650 mg by mouth 2 (two) times daily. Take 2 tablets once a day 8:00 pm   b complex vitamins capsule Take 1 capsule by mouth daily.   calcium-vitamin D 500-200 MG-UNIT tablet Commonly known as:  OSCAL WITH D Take 1 tablet by mouth daily.   cholecalciferol 1000 units tablet Commonly known as:  VITAMIN D One daily for vitamin D supplement   diclofenac sodium 1 % Gel Commonly known as:  VOLTAREN Apply 2 g topically 3 (three) times daily. Apply to left shoulder.   Fluticasone-Salmeterol 500-50 MCG/DOSE Aepb Commonly known as:  ADVAIR DISKUS INHALE 1 PUFF EVERY 12 HOURS.   gabapentin 300 MG capsule Commonly known as:  NEURONTIN Take 300 mg by mouth 3 (three) times daily.   guaiFENesin 600 MG 12 hr tablet Commonly known as:  MUCINEX Take 600 mg by mouth 2 (two) times daily.   latanoprost 0.005 % ophthalmic solution Commonly known as:  XALATAN 1 drop. One drop both eyes at bedtime   levalbuterol 0.63 MG/3ML nebulizer solution Commonly known as:  XOPENEX Take 0.63 mg by nebulization every 6 (six) hours as needed for wheezing or shortness of breath.   omeprazole 20 MG capsule Commonly known as:  PRILOSEC Take 20 mg by mouth daily.   PREPARATION H EX Apply topically. Apply ointment for hemorrhoids four times a day as needed for discomfort   saccharomyces boulardii 250 MG capsule Commonly known  as:  FLORASTOR Take 250 mg by mouth. Take one capsule once a day   SORE THROAT SPRAY 1.4 % Liqd Generic drug:  phenol Use as directed 1 spray in the mouth or throat as needed for throat irritation / pain.   sulfamethoxazole-trimethoprim 800-160 MG tablet Commonly known as:  BACTRIM DS,SEPTRA DS Take 1 tablet by mouth every Monday, Wednesday, and Friday. HOLD WHILE ON AUGMENTIN, THEN RESUME AS PREVIOUS   THERAVIM-M Tabs Take 1 tablet by mouth daily.   warfarin 4 MG tablet Commonly known as:  COUMADIN Take 4 mg by mouth daily at 6 PM. Take once daily on Sun, Mon, Tues and Wed.   warfarin 3 MG tablet Commonly known as:  COUMADIN Take 3 mg by  mouth daily at 6 PM. Take once a day on Thurs, Fri, and Sat.       Review of Systems  Constitutional: Positive for fever. Negative for chills and diaphoresis.       Frail and generally weak.  HENT: Positive for hearing loss (Deaf on left, hearing aid on right ), nosebleeds and sore throat. Negative for congestion, ear discharge, ear pain and tinnitus.        Left cochlear implant in place and has a magnet that she applies to the left parietal scalp associated with the cochlear implant.  Eyes: Positive for discharge (OS) and redness (OS). Negative for pain.  Respiratory: Positive for cough and shortness of breath. Negative for wheezing (Intermittent).        History Wegener's granulomatosis. History of chronic sinusitis related to her Wegener's. Chronic cough and yellowish phlegm production   Cardiovascular: Positive for leg swelling. Negative for chest pain and palpitations.       Trace R+L foot/ankle, R>L  Gastrointestinal: Negative for abdominal pain, constipation, diarrhea and nausea.       History of GERD.  Endocrine: Negative for polydipsia.  Genitourinary: Positive for frequency. Negative for dysuria, flank pain, hematuria and urgency.       Frequency induced by furosemide. Episodes of incontinence of urine.  Musculoskeletal: Positive  for arthralgias and gait problem. Negative for back pain, myalgias and neck pain.       Generalized weakness Left upper extremity pain, limited ROM of the left shoulder  Skin: Positive for wound. Negative for rash.       Painfu uUlcer of the left 2nd toe medially.  Neurological: Positive for weakness. Negative for dizziness, tremors, seizures and headaches.       Neuropathic leg/foot pain. Generalized weakness BLE  Hematological: Does not bruise/bleed easily.       Chronic anemia.  Psychiatric/Behavioral: Negative for hallucinations and suicidal ideas. The patient is nervous/anxious.        Husband is in SNF    Immunization History  Administered Date(s) Administered  . Influenza Split 03/04/2012, 03/11/2014, 04/01/2015  . Influenza,inj,Quad PF,36+ Mos 03/03/2013  . Influenza-Unspecified 04/01/2015, 04/19/2016  . PPD Test 05/25/2015  . Pneumococcal Conjugate-13 07/01/2015  . Pneumococcal Polysaccharide-23 09/02/2015  . Zoster 09/30/1994   Pertinent  Health Maintenance Due  Topic Date Due  . INFLUENZA VACCINE  Completed  . DEXA SCAN  Completed  . PNA vac Low Risk Adult  Completed   Fall Risk  12/09/2015 09/23/2015 09/09/2015 02/03/2015 12/04/2014  Falls in the past year? No Yes Yes Yes Yes  Number falls in past yr: - 2 or more 2 or more 2 or more 2 or more  Injury with Fall? - No No - No   Functional Status Survey:    Vitals:   07/13/16 1032  BP: 132/68  Pulse: 82  Resp: 20  Temp: 98.6 F (37 C)  Weight: 138 lb (62.6 kg)  Height: 5\' 1"  (1.549 m)   Body mass index is 26.07 kg/m. Physical Exam  Constitutional: She is oriented to person, place, and time. She appears well-developed. No distress.  Thin and frail. Weight stable  since 3/16  HENT:  Head: Normocephalic and atraumatic.  Nose: Nose normal.  Mouth/Throat: No oropharyngeal exudate.  Bilateral hearing loss. Hearing aid - right ear Pending left cochlear implant. Erythema of the pharynx.  Eyes: EOM are normal.  Pupils are equal, round, and reactive to light. Right eye exhibits no discharge. Left eye exhibits no discharge. No  scleral icterus.  Left conjunctiva quite red and bloodshot. There is a whitish exudate as well.  Neck: Normal range of motion. Neck supple. No JVD present. No tracheal deviation present. No thyromegaly present.  Cardiovascular: Normal rate, regular rhythm, normal heart sounds and intact distal pulses.   No murmur heard. Pulmonary/Chest: Effort normal. No stridor. No respiratory distress. She has no wheezes. She has rales. She exhibits no tenderness.  Abdominal: Soft. Bowel sounds are normal. She exhibits no distension and no mass. There is no tenderness. There is no rebound and no guarding.  Musculoskeletal: Normal range of motion. She exhibits edema (trace) and tenderness.  R+L foot/ankle, R>L, trace to 1+ Left upper extremity pain. L shoulder limited ROM  Lymphadenopathy:    She has no cervical adenopathy.  Neurological: She is alert and oriented to person, place, and time. She has normal reflexes. No cranial nerve deficit. She exhibits normal muscle tone. Coordination normal.  The patient has history of neuropathy of bilateral lower extremities which resulted in generalized weakness and pain of legs, she is only able to ambulates a few steps with walker in her room and depend on w/c to go further. In order to maintain her quality of life and mobility, she needs wheelchair to travel to dinning room for meals and attend activities on the [place] where she resides. Due to her left upper extremity pain related to an injury occurred 4-5 years, she is unable to propel manual wheelchair or steer a scooter in her living space to maintain her mobility. Mrs Biswell manages her own affairs and finances with minimal assistance. She has worked with therapy in the past and has been deemed to be safe to operate a power chair.    Skin: Skin is warm and dry. No rash noted. She is not  diaphoretic. No erythema. No pallor.  Painful ulcer of the left 2nd toe medially.Left 2nd toe overlaps the great toe. R ankle bruise, able to bear weight.   Psychiatric: She has a normal mood and affect. Her behavior is normal. Judgment and thought content normal.    Labs reviewed:  Recent Labs  09/15/15 1342 11/26/15 02/08/16 06/29/16  NA 141 137 141 136*  K 4.1 4.1 4.1 4.1  CL 109  --   --   --   CO2 23  --   --   --   GLUCOSE 97  --   --   --   BUN 13 13 10 13   CREATININE 0.87 0.8 0.8 0.9  CALCIUM 9.9  --   --   --     Recent Labs  08/12/15 11/26/15 02/08/16  AST 21 33 23  ALT 15 20 17   ALKPHOS 102 171* 127*    Recent Labs  09/23/15 0908  10/27/15 0711  12/02/15 1327 02/08/16 06/01/16 1247 06/29/16  WBC 3.6*  < > 4.4  < > 5.3 4.0 5.3 7.3  NEUTROABS 2.1  --   --   --  3.3  --  3.4  --   HGB 9.9*  < > 10.7*  < > 12.1 11.7* 12.9 11.3*  HCT 32.2*  < > 34.2*  < > 37.0 37 39.6 35*  MCV 80*  --  84.0  --  88  --  91  --   PLT 449*  < > 324  < > 303 256 276 228  < > = values in this interval not displayed. Lab Results  Component Value Date   TSH 1.56  05/13/2015   Lab Results  Component Value Date   HGBA1C 5.8 (H) 07/28/2014   No results found for: CHOL, HDL, LDLCALC, LDLDIRECT, TRIG, CHOLHDL  Significant Diagnostic Results in last 30 days:  No results found.  Assessment/Plan WEGENERS GRANULOMATOSIS stable, on Septra DS MWF indefinitely, Advair, Xopenex prn, Mucinex bid.    Pulmonary embolism ([place]) Managed on chronic warfarin, PharmD Elliot Gault in [place]   Constipation Stable, continue Miralax.    GERD (gastroesophageal reflux disease) Stable, continue Omeprazole 20mg  daily   Peripheral neuropathy ([facility]) Lower extremities weakness, motorized w/c for mobility, continue Neurontin   Ulcer of left second toe ([facility]) Not healing, will try collagen dressing.   Anemia of chronic disease Iron infusion 06/15/16, Hgb 11.3 06/29/16  Edema Off  diuretics. Only trace edema in ankles-no longer needing TED.    Generalized anxiety disorder Able to be off prn Lorazepam    Contusion of right ankle Able to bear weight, should heal.      Family/ staff Communication: AL  Labs/tests ordered:  none

## 2016-07-13 NOTE — Assessment & Plan Note (Signed)
Not healing, will try collagen dressing.

## 2016-07-13 NOTE — Assessment & Plan Note (Signed)
Able to bear weight, should heal.

## 2016-07-13 NOTE — Assessment & Plan Note (Signed)
Stable, continue Omeprazole 20mg daily.  

## 2016-07-13 NOTE — Assessment & Plan Note (Signed)
Lower extremities weakness, motorized w/c for mobility, continue Neurontin   

## 2016-07-13 NOTE — Assessment & Plan Note (Signed)
Stable, continue Miralax.  

## 2016-08-02 ENCOUNTER — Non-Acute Institutional Stay: Payer: Medicare Other | Admitting: Nurse Practitioner

## 2016-08-02 ENCOUNTER — Encounter: Payer: Self-pay | Admitting: Nurse Practitioner

## 2016-08-02 DIAGNOSIS — K5900 Constipation, unspecified: Secondary | ICD-10-CM | POA: Diagnosis not present

## 2016-08-02 DIAGNOSIS — L97521 Non-pressure chronic ulcer of other part of left foot limited to breakdown of skin: Secondary | ICD-10-CM | POA: Diagnosis not present

## 2016-08-02 DIAGNOSIS — I739 Peripheral vascular disease, unspecified: Secondary | ICD-10-CM

## 2016-08-02 DIAGNOSIS — D638 Anemia in other chronic diseases classified elsewhere: Secondary | ICD-10-CM | POA: Diagnosis not present

## 2016-08-02 DIAGNOSIS — K219 Gastro-esophageal reflux disease without esophagitis: Secondary | ICD-10-CM

## 2016-08-02 DIAGNOSIS — R609 Edema, unspecified: Secondary | ICD-10-CM

## 2016-08-02 DIAGNOSIS — M3130 Wegener's granulomatosis without renal involvement: Secondary | ICD-10-CM

## 2016-08-02 DIAGNOSIS — Z86711 Personal history of pulmonary embolism: Secondary | ICD-10-CM

## 2016-08-02 DIAGNOSIS — G600 Hereditary motor and sensory neuropathy: Secondary | ICD-10-CM

## 2016-08-02 NOTE — Assessment & Plan Note (Signed)
Improving, continue collagen dressing.

## 2016-08-02 NOTE — Assessment & Plan Note (Signed)
Stable, continue Miralax.  

## 2016-08-02 NOTE — Assessment & Plan Note (Signed)
Iron infusion 06/15/16, Hgb 11.3 06/29/16

## 2016-08-02 NOTE — Assessment & Plan Note (Signed)
stable, on Septra DS MWF indefinitely, Advair, Xopenex prn, Mucinex bid.

## 2016-08-02 NOTE — Assessment & Plan Note (Addendum)
Absent pedal pulses, the lateral right malleolus wound, not infected, tender, very mild warmth and redness. Arterial US BLE, ABI R+L, Bactroban oint bid to the lateral right malleolus wound. X-ray R ankle, AP and lateral views to r/u fx or osteomyelitis.

## 2016-08-02 NOTE — Progress Notes (Signed)
Location:  [facility] Room Number: [id number] Place of Service:  ALF [phone]) Provider:  Jaqueline Uber, [place] NP  Jeanmarie Hubert, MD  Patient Care Team: Estill Dooms, MD as PCP - General (Internal Medicine) Unice Bailey, MD as Consulting Physician (Rheumatology) Rozetta Nunnery, MD as Consulting Physician (Otolaryngology) Marcial Pacas, MD as Consulting Physician (Neurology) Michel Bickers, MD as Consulting Physician (Infectious Diseases) Volanda Napoleon, MD as Consulting Physician (Oncology) Juanito Doom, MD as Consulting Physician (Pulmonary Disease) Tanda Rockers, MD as Consulting Physician (Pulmonary Disease) Estill Dooms, MD as Consulting Physician (Geriatric Medicine) Coralie Keens, MD as Consulting Physician (General Surgery) Cross Jorge Otho Darner, NP as Nurse Practitioner (Nurse Practitioner)  Extended Emergency Contact Information Primary Emergency Contact: Haygood,Sandra Address: [address] [place] [address] Johnnette Litter of [place] Phone: [phone] Relation: Daughter Secondary Emergency Contact: Dunnaway,[name] Address: [address] [place]          [place], [place] [address] [place] Phone: [phone] Mobile Phone: [phone] Relation: Son  Code Status:  Full Code Goals of care: Advanced Directive information Advanced Directives 08/02/2016  Does Patient Have a Medical Advance Directive? Yes  Type of Paramedic of [place];Living will  Does patient want to make changes to medical advance directive? No - Patient declined  Copy of [place] in Chart? Yes  Would patient like information on creating a medical advance directive? -     Chief Complaint  Patient presents with  . Acute Visit    sm red bumps on inner (Rt) ankle, itching    HPI:  Pt is a 83 y.o. female seen today for an acute visit for absent pedal pulses, the lateral right malleolus wound, not  infected, tender, very mild warmth and redness.   Hx of Anemia, Iron infusion 06/15/16 per Hematology. Peripheral neuropathy, w/c for mobility, takes Gabapentin 300mg  tid. GERD symptomatic free on Omeprazole 20mg  daily. Takes Coumadin for PE. Wegeners Granulomatosis, stable, on Septra DS MWF indefinitely, Advair, Xopenex prn, Mucinex bid.    Past Medical History:  Diagnosis Date  . Acute respiratory failure with hypoxia ([place])   . Anemia   . Aspergillosis ([place])   . Bladder incontinence   . Blindness of one eye   . Cancer ([place])    melanoma   ([place])  . Chronic sinusitis   . Clotting disorder ([place])   . Difficult intubation    Difficult airway with intubation for ARF 07/22/14 (due to anterior larynx, also had mucous plug)  . Fatigue 12/09/2015  . Glaucoma   . Hearing loss in left ear    hearing aid both ears  . History of pulmonary embolism 1997  . Interstitial emphysema ([place])   . Left shoulder pain 12/09/2015  . Multiple allergies   . Neuropathy ([place])   . OA (osteoarthritis)   . Pneumonia 12/15  . Shortness of breath dyspnea   . Stroke [facility])    ??? blind left eye  . Wegener's granulomatosis ([place])    Past Surgical History:  Procedure Laterality Date  . bronchosocpy     pt. states she has had over 10 in last 20 yrs.  Marland Kitchen CATARACT EXTRACTION    . COCHLEAR IMPLANT Left 10/27/2015   Procedure: LEFT COCHLEAR IMPLANT;  Surgeon: Leta Baptist, MD;  Location: [facility];  Service: ENT;  Laterality: Left;  . INGUINAL HERNIA REPAIR Left 02/17/2014  Procedure: LEFT INGUINAL HERNIA REPAIR WITH MESH;  Surgeon: Harl Bowie, MD;  Location: [facility];  Service: General;  Laterality: Left;  . INSERTION OF MESH N/A 02/17/2014   Procedure: INSERTION OF MESH;  Surgeon: Harl Bowie, MD;  Location: [facility];  Service: General;  Laterality: N/A;  . KNEE ARTHROSCOPY  2006   rt   . MELANOMA EXCISION     left leg  . PUBOVAGINAL SLING    . TEAR DUCT PROBING    . TUBAL  LIGATION    . TYMPANOMASTOIDECTOMY Left 04/28/2015  . TYMPANOMASTOIDECTOMY Left 04/28/2015   Procedure: LEFT TYMPANOMASTOIDECTOMY;  Surgeon: Leta Baptist, MD;  Location: [facility];  Service: ENT;  Laterality: Left;  Marland Kitchen VENA CAVA FILTER PLACEMENT  1997   [place] filter    Allergies  Allergen Reactions  . Adhesive [Tape] Rash    On on lower extremities     Allergies as of 08/02/2016      Reactions   Adhesive [tape] Rash   On on lower extremities       Medication List       Accurate as of 08/02/16  3:45 PM. Always use your most recent med list.          acetaminophen 650 MG CR tablet Commonly known as:  TYLENOL Take 650 mg by mouth 2 (two) times daily. Take 2 tablets once a day 8:00 pm   b complex vitamins capsule Take 1 capsule by mouth daily.   calcium-vitamin D 500-200 MG-UNIT tablet Commonly known as:  OSCAL WITH D Take 1 tablet by mouth daily.   cholecalciferol 1000 units tablet Commonly known as:  VITAMIN D One daily for vitamin D supplement   diclofenac sodium 1 % Gel Commonly known as:  VOLTAREN Apply 2 g topically 3 (three) times daily. Apply to left shoulder.   Fluticasone-Salmeterol 500-50 MCG/DOSE Aepb Commonly known as:  ADVAIR DISKUS INHALE 1 PUFF EVERY 12 HOURS.   gabapentin 300 MG capsule Commonly known as:  NEURONTIN Take 300 mg by mouth 3 (three) times daily.   guaiFENesin 600 MG 12 hr tablet Commonly known as:  MUCINEX Take 600 mg by mouth 2 (two) times daily.   latanoprost 0.005 % ophthalmic solution Commonly known as:  XALATAN 1 drop. One drop both eyes at bedtime   levalbuterol 0.63 MG/3ML nebulizer solution Commonly known as:  XOPENEX Take 0.63 mg by nebulization every 6 (six) hours as needed for wheezing or shortness of breath.   omeprazole 20 MG capsule Commonly known as:  PRILOSEC Take 20 mg by mouth daily.   PREPARATION H EX Apply topically. Apply ointment for hemorrhoids four times a day as needed for discomfort   saccharomyces  boulardii 250 MG capsule Commonly known as:  FLORASTOR Take 250 mg by mouth. Take one capsule once a day   SORE THROAT SPRAY 1.4 % Liqd Generic drug:  phenol Use as directed 1 spray in the mouth or throat as needed for throat irritation / pain.   sulfamethoxazole-trimethoprim 800-160 MG tablet Commonly known as:  BACTRIM DS,SEPTRA DS Take 1 tablet by mouth every Monday, Wednesday, and Friday. HOLD WHILE ON AUGMENTIN, THEN RESUME AS PREVIOUS   THERAVIM-M Tabs Take 1 tablet by mouth daily.   warfarin 4 MG tablet Commonly known as:  COUMADIN Take 4 mg by mouth daily at 6 PM. Take once daily on Sun, Mon, Tues and Wed.   warfarin 3 MG tablet Commonly known as:  COUMADIN Take  3 mg by mouth daily at 6 PM. Take once a day on Thurs, Fri, and Sat.       Review of Systems  Constitutional: Negative for chills, diaphoresis and fever.       Frail and generally weak.  HENT: Positive for hearing loss (Deaf on left, hearing aid on right ). Negative for congestion, ear discharge, ear pain, nosebleeds, sore throat and tinnitus.        Left cochlear implant in place and has a magnet that she applies to the left parietal scalp associated with the cochlear implant.  Eyes: Positive for discharge (OS). Negative for pain and redness (OS).  Respiratory: Positive for cough and shortness of breath. Negative for wheezing (Intermittent).        History Wegener's granulomatosis. History of chronic sinusitis related to her Wegener's. Chronic cough and yellowish phlegm production   Cardiovascular: Positive for leg swelling. Negative for chest pain and palpitations.       Trace R+L foot/ankle, R>L  Gastrointestinal: Negative for abdominal pain, constipation, diarrhea and nausea.       History of GERD.  Endocrine: Negative for polydipsia.  Genitourinary: Positive for frequency. Negative for dysuria, flank pain, hematuria and urgency.       Frequency induced by furosemide. Episodes of incontinence of urine.    Musculoskeletal: Positive for arthralgias and gait problem. Negative for back pain, myalgias and neck pain.       Generalized weakness Left upper extremity pain, limited ROM of the left shoulder  Skin: Positive for wound. Negative for rash.       Painfu uUlcer of the left 2nd toe medially. Absent pedal pulses, the lateral right malleolus wound, not infected, tender, very mild warmth and redness.  Neurological: Positive for weakness. Negative for dizziness, tremors, seizures and headaches.       Neuropathic leg/foot pain. Generalized weakness BLE  Hematological: Does not bruise/bleed easily.       Chronic anemia.  Psychiatric/Behavioral: Negative for hallucinations and suicidal ideas. The patient is nervous/anxious.        Husband is in SNF    Immunization History  Administered Date(s) Administered  . Influenza Split 03/04/2012, 03/11/2014, 04/01/2015  . Influenza,inj,Quad PF,36+ Mos 03/03/2013  . Influenza-Unspecified 04/01/2015, 04/19/2016  . PPD Test 05/25/2015  . Pneumococcal Conjugate-13 07/01/2015  . Pneumococcal Polysaccharide-23 09/02/2015  . Zoster 09/30/1994   Pertinent  Health Maintenance Due  Topic Date Due  . INFLUENZA VACCINE  Completed  . DEXA SCAN  Completed  . PNA vac Low Risk Adult  Completed   Fall Risk  12/09/2015 09/23/2015 09/09/2015 02/03/2015 12/04/2014  Falls in the past year? No Yes Yes Yes Yes  Number falls in past yr: - 2 or more 2 or more 2 or more 2 or more  Injury with Fall? - No No - No   Functional Status Survey:    Vitals:   08/02/16 1440  BP: 126/74  Pulse: 82  Resp: 20  Temp: 99.3 F (37.4 C)  Weight: 138 lb (62.6 kg)  Height: 5\' 1"  (1.549 m)   Body mass index is 26.07 kg/m. Physical Exam  Constitutional: She is oriented to person, place, and time. She appears well-developed. No distress.  Thin and frail. Weight stable  since 3/16  HENT:  Head: Normocephalic and atraumatic.  Nose: Nose normal.  Mouth/Throat: No oropharyngeal exudate.   Bilateral hearing loss. Hearing aid - right ear Pending left cochlear implant. Erythema of the pharynx.  Eyes: EOM are normal. Pupils are  equal, round, and reactive to light. Right eye exhibits no discharge. Left eye exhibits no discharge. No scleral icterus.  Left conjunctiva quite red and bloodshot. There is a whitish exudate as well.  Neck: Normal range of motion. Neck supple. No JVD present. No tracheal deviation present. No thyromegaly present.  Cardiovascular: Normal rate, regular rhythm, normal heart sounds and intact distal pulses.   No murmur heard. Absent pedal pulses, the lateral right malleolus wound, not infected, tender, very mild warmth and redness.   Pulmonary/Chest: Effort normal. No stridor. No respiratory distress. She has no wheezes. She has rales. She exhibits no tenderness.  Abdominal: Soft. Bowel sounds are normal. She exhibits no distension and no mass. There is no tenderness. There is no rebound and no guarding.  Musculoskeletal: Normal range of motion. She exhibits edema (trace) and tenderness.  R+L foot/ankle, R>L, trace to 1+ Left upper extremity pain. L shoulder limited ROM  Lymphadenopathy:    She has no cervical adenopathy.  Neurological: She is alert and oriented to person, place, and time. She has normal reflexes. No cranial nerve deficit. She exhibits normal muscle tone. Coordination normal.  The patient has history of neuropathy of bilateral lower extremities which resulted in generalized weakness and pain of legs, she is only able to ambulates a few steps with walker in her room and depend on w/c to go further. In order to maintain her quality of life and mobility, she needs wheelchair to travel to dinning room for meals and attend activities on the [place] where she resides. Due to her left upper extremity pain related to an injury occurred 4-5 years, she is unable to propel manual wheelchair or steer a scooter in her living space to maintain her  mobility. Mrs Loughman manages her own affairs and finances with minimal assistance. She has worked with therapy in the past and has been deemed to be safe to operate a power chair.    Skin: Skin is warm and dry. No rash noted. She is not diaphoretic. No erythema. No pallor.  Painful ulcer of the left 2nd toe medially.Left 2nd toe overlaps the great toe. R ankle bruise, able to bear weight.   Psychiatric: She has a normal mood and affect. Her behavior is normal. Judgment and thought content normal.    Labs reviewed:  Recent Labs  09/15/15 1342 11/26/15 02/08/16 06/29/16  NA 141 137 141 136*  K 4.1 4.1 4.1 4.1  CL 109  --   --   --   CO2 23  --   --   --   GLUCOSE 97  --   --   --   BUN 13 13 10 13   CREATININE 0.87 0.8 0.8 0.9  CALCIUM 9.9  --   --   --     Recent Labs  08/12/15 11/26/15 02/08/16  AST 21 33 23  ALT 15 20 17   ALKPHOS 102 171* 127*    Recent Labs  09/23/15 0908  10/27/15 0711  12/02/15 1327 02/08/16 06/01/16 1247 06/29/16  WBC 3.6*  < > 4.4  < > 5.3 4.0 5.3 7.3  NEUTROABS 2.1  --   --   --  3.3  --  3.4  --   HGB 9.9*  < > 10.7*  < > 12.1 11.7* 12.9 11.3*  HCT 32.2*  < > 34.2*  < > 37.0 37 39.6 35*  MCV 80*  --  84.0  --  88  --  91  --  PLT 449*  < > 324  < > 303 256 276 228  < > = values in this interval not displayed. Lab Results  Component Value Date   TSH 1.56 05/13/2015   Lab Results  Component Value Date   HGBA1C 5.8 (H) 07/28/2014   No results found for: CHOL, HDL, LDLCALC, LDLDIRECT, TRIG, CHOLHDL  Significant Diagnostic Results in last 30 days:  No results found.  Assessment/Plan PVD (peripheral vascular disease) (HCC) Absent pedal pulses, the lateral right malleolus wound, not infected, tender, very mild warmth and redness. Arterial US BLE, ABI R+L, Bactroban oint bid to the lateral right malleolus wound. X-ray R ankle, AP and lateral views to r/u fx or osteomyelitis.   WEGENERS GRANULOMATOSIS stable, on Septra DS MWF indefinitely,  Advair, Xopenex prn, Mucinex bid.    Constipation Stable, continue Miralax.    GERD (gastroesophageal reflux disease) Stable, continue Omeprazole 20mg  daily   Peripheral neuropathy (HCC) Lower extremities weakness, motorized w/c for mobility, continue Neurontin    Ulcer of left second toe (HCC) Improving, continue collagen dressing.    Anemia of chronic disease Iron infusion 06/15/16, Hgb 11.3 06/29/16  Personal history of PE (pulmonary embolism) Managed on chronic warfarin    Edema Off diuretics. Only trace edema in ankles-no longer needing TED.      Family/ staff Communication: AL  Labs/tests ordered:  BLE arterial US, X-ray R ankle AP + Lateral. ABI R+L

## 2016-08-02 NOTE — Assessment & Plan Note (Signed)
Managed on chronic warfarin   

## 2016-08-02 NOTE — Assessment & Plan Note (Signed)
Lower extremities weakness, motorized w/c for mobility, continue Neurontin   

## 2016-08-02 NOTE — Assessment & Plan Note (Signed)
Off diuretics. Only trace edema in ankles-no longer needing TED.  

## 2016-08-02 NOTE — Assessment & Plan Note (Signed)
Stable, continue Omeprazole 20mg daily.  

## 2016-08-03 LAB — POCT INR: INR: 2 — AB (ref <=1.1)

## 2016-08-03 LAB — PROTIME-INR: PROTIME: 20.8 s — AB (ref 10.0–13.8)

## 2016-08-10 ENCOUNTER — Other Ambulatory Visit: Payer: Self-pay | Admitting: *Deleted

## 2016-08-21 ENCOUNTER — Encounter: Payer: Self-pay | Admitting: Nurse Practitioner

## 2016-08-21 ENCOUNTER — Non-Acute Institutional Stay: Payer: Medicare Other | Admitting: Nurse Practitioner

## 2016-08-21 DIAGNOSIS — K5900 Constipation, unspecified: Secondary | ICD-10-CM | POA: Diagnosis not present

## 2016-08-21 DIAGNOSIS — S90822A Blister (nonthermal), left foot, initial encounter: Secondary | ICD-10-CM | POA: Diagnosis not present

## 2016-08-21 DIAGNOSIS — I739 Peripheral vascular disease, unspecified: Secondary | ICD-10-CM

## 2016-08-21 DIAGNOSIS — D638 Anemia in other chronic diseases classified elsewhere: Secondary | ICD-10-CM | POA: Diagnosis not present

## 2016-08-21 DIAGNOSIS — K219 Gastro-esophageal reflux disease without esophagitis: Secondary | ICD-10-CM

## 2016-08-21 DIAGNOSIS — G600 Hereditary motor and sensory neuropathy: Secondary | ICD-10-CM

## 2016-08-21 NOTE — Progress Notes (Signed)
Location:  [facility] Room Number: [id number] Place of Service:  ALF [phone]) Provider:  Dwana Garin, Manxie NP  Jeanmarie Hubert, MD  Patient Care Team: Estill Dooms, MD as PCP - General (Internal Medicine) Unice Bailey, MD as Consulting Physician (Rheumatology) Rozetta Nunnery, MD as Consulting Physician (Otolaryngology) Marcial Pacas, MD as Consulting Physician (Neurology) Michel Bickers, MD as Consulting Physician (Infectious Diseases) Volanda Napoleon, MD as Consulting Physician (Oncology) Juanito Doom, MD as Consulting Physician (Pulmonary Disease) Tanda Rockers, MD as Consulting Physician (Pulmonary Disease) Estill Dooms, MD as Consulting Physician (Geriatric Medicine) Coralie Keens, MD as Consulting Physician (General Surgery) Maegan Buller Otho Darner, NP as Nurse Practitioner (Nurse Practitioner)  Extended Emergency Contact Information Primary Emergency Contact: Haygood,Sandra Address: [address] [place] [address] Johnnette Litter of [place] Phone: [phone] Relation: Daughter Secondary Emergency Contact: Horan,[name] Address: [address] [place]          [place], [place] [address] [place] Phone: [phone] Mobile Phone: [phone] Relation: Son  Code Status:  Full Code Goals of care: Advanced Directive information Advanced Directives 08/02/2016  Does Patient Have a Medical Advance Directive? Yes  Type of Paramedic of [place];Living will  Does patient want to make changes to medical advance directive? No - Patient declined  Copy of [place] in Chart? Yes  Would patient like information on creating a medical advance directive? -     Chief Complaint  Patient presents with  . Acute Visit    left foot swelling, left heel blood filled blisters x2    HPI:  Pt is a 83 y.o. female seen today for an acute visit for th  left foot swelling, blood filled blisters x2  left heel, warmth of the left heel.     Hx of Anemia, Iron infusion 06/15/16 per Hematology. Peripheral neuropathy, w/c for mobility, takes Gabapentin 300mg  tid. GERD symptomatic free on Omeprazole 20mg  daily. Takes Coumadin for PE. Wegeners Granulomatosis, stable, on Septra DS MWF indefinitely, Advair, Xopenex prn, Mucinex bid.    Past Medical History:  Diagnosis Date  . Acute respiratory failure with hypoxia ([place])   . Anemia   . Aspergillosis ([place])   . Bladder incontinence   . Blindness of one eye   . Cancer ([place])    melanoma   ([place])  . Chronic sinusitis   . Clotting disorder ([place])   . Difficult intubation    Difficult airway with intubation for ARF 07/22/14 (due to anterior larynx, also had mucous plug)  . Fatigue 12/09/2015  . Glaucoma   . Hearing loss in left ear    hearing aid both ears  . History of pulmonary embolism 1997  . Interstitial emphysema ([place])   . Left shoulder pain 12/09/2015  . Multiple allergies   . Neuropathy ([place] [place])   . OA (osteoarthritis)   . Pneumonia 12/15  . Shortness of breath dyspnea   . Stroke [facility])    ??? blind left eye  . Wegener's granulomatosis ([facility])    Past Surgical History:  Procedure Laterality Date  . bronchosocpy     pt. states she has had over 10 in last 20 yrs.  Marland Kitchen CATARACT EXTRACTION    . COCHLEAR IMPLANT Left 10/27/2015   Procedure: LEFT COCHLEAR IMPLANT;  Surgeon: Leta Baptist, MD;  Location: [facility];  Service: ENT;  Laterality: Left;  . INGUINAL HERNIA REPAIR  Left 02/17/2014   Procedure: LEFT INGUINAL HERNIA REPAIR WITH MESH;  Surgeon: Harl Bowie, MD;  Location: [facility];  Service: General;  Laterality: Left;  . INSERTION OF MESH N/A 02/17/2014   Procedure: INSERTION OF MESH;  Surgeon: Harl Bowie, MD;  Location: [facility];  Service: General;  Laterality: N/A;  . KNEE ARTHROSCOPY  2006   rt   . MELANOMA EXCISION     left leg  . PUBOVAGINAL SLING    . TEAR DUCT PROBING    . TUBAL LIGATION      . TYMPANOMASTOIDECTOMY Left 04/28/2015  . TYMPANOMASTOIDECTOMY Left 04/28/2015   Procedure: LEFT TYMPANOMASTOIDECTOMY;  Surgeon: Leta Baptist, MD;  Location: [facility];  Service: ENT;  Laterality: Left;  Marland Kitchen VENA CAVA FILTER PLACEMENT  1997   [place] filter    Allergies  Allergen Reactions  . Adhesive [Tape] Rash    On on lower extremities     Allergies as of 08/21/2016      Reactions   Adhesive [tape] Rash   On on lower extremities       Medication List       Accurate as of 08/21/16  4:31 PM. Always use your most recent med list.          acetaminophen 650 MG CR tablet Commonly known as:  TYLENOL Take 650 mg by mouth 2 (two) times daily. Take 2 tablets once a day 8:00 pm   b complex vitamins capsule Take 1 capsule by mouth daily.   calcium-vitamin D 500-200 MG-UNIT tablet Commonly known as:  OSCAL WITH D Take 1 tablet by mouth daily.   cholecalciferol 1000 units tablet Commonly known as:  VITAMIN D One daily for vitamin D supplement   diclofenac sodium 1 % Gel Commonly known as:  VOLTAREN Apply 2 g topically 3 (three) times daily. Apply to left shoulder.   Fluticasone-Salmeterol 500-50 MCG/DOSE Aepb Commonly known as:  ADVAIR DISKUS INHALE 1 PUFF EVERY 12 HOURS.   gabapentin 300 MG capsule Commonly known as:  NEURONTIN Take 300 mg by mouth 3 (three) times daily.   guaiFENesin 600 MG 12 hr tablet Commonly known as:  MUCINEX Take 600 mg by mouth 2 (two) times daily.   latanoprost 0.005 % ophthalmic solution Commonly known as:  XALATAN 1 drop. One drop both eyes at bedtime   levalbuterol 0.63 MG/3ML nebulizer solution Commonly known as:  XOPENEX Take 0.63 mg by nebulization every 6 (six) hours as needed for wheezing or shortness of breath.   omeprazole 20 MG capsule Commonly known as:  PRILOSEC Take 20 mg by mouth daily.   PREPARATION H EX Apply topically. Apply ointment for hemorrhoids four times a day as needed for discomfort   saccharomyces boulardii  250 MG capsule Commonly known as:  FLORASTOR Take 250 mg by mouth. Take one capsule once a day   SORE THROAT SPRAY 1.4 % Liqd Generic drug:  phenol Use as directed 1 spray in the mouth or throat as needed for throat irritation / pain.   sulfamethoxazole-trimethoprim 800-160 MG tablet Commonly known as:  BACTRIM DS,SEPTRA DS Take 1 tablet by mouth every Monday, Wednesday, and Friday. HOLD WHILE ON AUGMENTIN, THEN RESUME AS PREVIOUS   THERAVIM-M Tabs Take 1 tablet by mouth daily.   warfarin 4 MG tablet Commonly known as:  COUMADIN Take 4 mg by mouth daily at 6 PM. Take once daily on Sun, Mon, Tues and Wed.   warfarin 3 MG tablet Commonly  known as:  COUMADIN Take 3 mg by mouth daily at 6 PM. Take once a day on Thurs, Fri, and Sat.       Review of Systems  Constitutional: Negative for chills, diaphoresis and fever.       Frail and generally weak.  HENT: Positive for hearing loss (Deaf on left, hearing aid on right ). Negative for congestion, ear discharge, ear pain, nosebleeds, sore throat and tinnitus.        Left cochlear implant in place and has a magnet that she applies to the left parietal scalp associated with the cochlear implant.  Eyes: Positive for discharge (OS). Negative for pain and redness (OS).  Respiratory: Positive for cough and shortness of breath. Negative for wheezing (Intermittent).        History Wegener's granulomatosis. History of chronic sinusitis related to her Wegener's. Chronic cough and yellowish phlegm production   Cardiovascular: Positive for leg swelling. Negative for chest pain and palpitations.       Trace R+L foot/ankle, R>L  Gastrointestinal: Negative for abdominal pain, constipation, diarrhea and nausea.       History of GERD.  Endocrine: Negative for polydipsia.  Genitourinary: Positive for frequency. Negative for dysuria, flank pain, hematuria and urgency.       Frequency induced by furosemide. Episodes of incontinence of urine.    Musculoskeletal: Positive for arthralgias and gait problem. Negative for back pain, myalgias and neck pain.       Generalized weakness Left upper extremity pain, limited ROM of the left shoulder  Skin: Positive for wound. Negative for rash.       Painful ulcer of the left 2nd toe medially. Absent pedal pulses, the lateral right malleolus wound, not infected, tender, mild warmth and redness. Left foot swelling, blood filled blisters x2 left heel, warmth of the left heel.   Neurological: Positive for weakness. Negative for dizziness, tremors, seizures and headaches.       Neuropathic leg/foot pain. Generalized weakness BLE  Hematological: Does not bruise/bleed easily.       Chronic anemia.  Psychiatric/Behavioral: Negative for hallucinations and suicidal ideas. The patient is nervous/anxious.        Husband is in SNF    Immunization History  Administered Date(s) Administered  . Influenza Split 03/04/2012, 03/11/2014, 04/01/2015  . Influenza,inj,Quad PF,36+ Mos 03/03/2013  . Influenza-Unspecified 04/01/2015, 04/19/2016  . PPD Test 05/25/2015  . Pneumococcal Conjugate-13 07/01/2015  . Pneumococcal Polysaccharide-23 09/02/2015  . Zoster 09/30/1994   Pertinent  Health Maintenance Due  Topic Date Due  . INFLUENZA VACCINE  Completed  . DEXA SCAN  Completed  . PNA vac Low Risk Adult  Completed   Fall Risk  12/09/2015 09/23/2015 09/09/2015 02/03/2015 12/04/2014  Falls in the past year? No Yes Yes Yes Yes  Number falls in past yr: - 2 or more 2 or more 2 or more 2 or more  Injury with Fall? - No No - No   Functional Status Survey:    There were no vitals filed for this visit. There is no height or weight on file to calculate BMI. Physical Exam  Constitutional: She is oriented to person, place, and time. She appears well-developed. No distress.  Thin and frail. Weight stable  since 3/16  HENT:  Head: Normocephalic and atraumatic.  Nose: Nose normal.  Mouth/Throat: No oropharyngeal exudate.   Bilateral hearing loss. Hearing aid - right ear Pending left cochlear implant. Erythema of the pharynx.  Eyes: EOM are normal. Pupils are equal, round,  and reactive to light. Right eye exhibits no discharge. Left eye exhibits no discharge. No scleral icterus.  Left conjunctiva quite red and bloodshot. There is a whitish exudate as well.  Neck: Normal range of motion. Neck supple. No JVD present. No tracheal deviation present. No thyromegaly present.  Cardiovascular: Normal rate, regular rhythm, normal heart sounds and intact distal pulses.   No murmur heard. Absent pedal pulses, the lateral right malleolus wound, not infected, tender, very mild warmth and redness.   Pulmonary/Chest: Effort normal. No stridor. No respiratory distress. She has no wheezes. She has rales. She exhibits no tenderness.  Abdominal: Soft. Bowel sounds are normal. She exhibits no distension and no mass. There is no tenderness. There is no rebound and no guarding.  Musculoskeletal: Normal range of motion. She exhibits edema (trace) and tenderness.  R+L foot/ankle, R>L, trace to 1+ Left upper extremity pain. L shoulder limited ROM  Lymphadenopathy:    She has no cervical adenopathy.  Neurological: She is alert and oriented to person, place, and time. She has normal reflexes. No cranial nerve deficit. She exhibits normal muscle tone. Coordination normal.  The patient has history of neuropathy of bilateral lower extremities which resulted in generalized weakness and pain of legs, she is only able to ambulates a few steps with walker in her room and depend on w/c to go further. In order to maintain her quality of life and mobility, she needs wheelchair to travel to dinning room for meals and attend activities on the [place] where she resides. Due to her left upper extremity pain related to an injury occurred 4-5 years, she is unable to propel manual wheelchair or steer a scooter in her living space to maintain her  mobility. Mrs Kreiling manages her own affairs and finances with minimal assistance. She has worked with therapy in the past and has been deemed to be safe to operate a power chair.    Skin: Skin is warm and dry. No rash noted. She is not diaphoretic. No erythema. No pallor.  Painful ulcer of the left 2nd toe medially.Left 2nd toe overlaps the great toe. R ankle bruise, able to bear weight.  Left foot swelling, blood filled blisters x2 left heel, warmth of the left heel.    Psychiatric: She has a normal mood and affect. Her behavior is normal. Judgment and thought content normal.    Labs reviewed:  Recent Labs  09/15/15 1342 11/26/15 02/08/16 06/29/16  NA 141 137 141 136*  K 4.1 4.1 4.1 4.1  CL 109  --   --   --   CO2 23  --   --   --   GLUCOSE 97  --   --   --   BUN 13 13 10 13   CREATININE 0.87 0.8 0.8 0.9  CALCIUM 9.9  --   --   --     Recent Labs  11/26/15 02/08/16  AST 33 23  ALT 20 17  ALKPHOS 171* 127*    Recent Labs  09/23/15 0908  10/27/15 0711  12/02/15 1327 02/08/16 06/01/16 1247 06/29/16  WBC 3.6*  < > 4.4  < > 5.3 4.0 5.3 7.3  NEUTROABS 2.1  --   --   --  3.3  --  3.4  --   HGB 9.9*  < > 10.7*  < > 12.1 11.7* 12.9 11.3*  HCT 32.2*  < > 34.2*  < > 37.0 37 39.6 35*  MCV 80*  --  84.0  --  88  --  91  --   PLT 449*  < > 324  < > 303 256 276 228  < > = values in this interval not displayed. Lab Results  Component Value Date   TSH 1.56 05/13/2015   Lab Results  Component Value Date   HGBA1C 5.8 (H) 07/28/2014   No results found for: CHOL, HDL, LDLCALC, LDLDIRECT, TRIG, CHOLHDL  Significant Diagnostic Results in last 30 days:  No results found.  Assessment/Plan Blister of heel, left, initial encounter X 2 blood filled blisters left heel, warmth and swelling of the left heel, left pedal edema is apparent. The patient c/o of her left shoe got tight because of the left foot edema. There is tissues found in the toe box of her left shoe, cleaned it out, left  shoe fits better. Like the left tight fit shoe caused left heel blisters and left foot swelling. Skin prep, Aspercreme, left foot boot at night to float the left heel. Observe.   PVD (peripheral vascular disease) ([place]) Left heel blood filled blisters, the lateral right malleolus wound, left toes wound, no s/s of infection.   Constipation Stable, continue Miralax.    GERD (gastroesophageal reflux disease) Stable, continue Omeprazole 20mg  daily   Peripheral neuropathy (HCC) Lower extremities weakness, motorized w/c for mobility, continue Neurontin    Anemia of chronic disease Iron infusion 06/15/16, Hgb 11.3 06/29/16     Family/ staff Communication: AL  Labs/tests ordered:  none

## 2016-08-21 NOTE — Assessment & Plan Note (Signed)
Left heel blood filled blisters, the lateral right malleolus wound, left toes wound, no s/s of infection.

## 2016-08-21 NOTE — Assessment & Plan Note (Signed)
Stable, continue Omeprazole 20mg daily.  

## 2016-08-21 NOTE — Assessment & Plan Note (Signed)
X 2 blood filled blisters left heel, warmth and swelling of the left heel, left pedal edema is apparent. The patient c/o of her left shoe got tight because of the left foot edema. There is tissues found in the toe box of her left shoe, cleaned it out, left shoe fits better. Like the left tight fit shoe caused left heel blisters and left foot swelling. Skin prep, Aspercreme, left foot boot at night to float the left heel. Observe.

## 2016-08-21 NOTE — Assessment & Plan Note (Signed)
Iron infusion 06/15/16, Hgb 11.3 06/29/16

## 2016-08-21 NOTE — Assessment & Plan Note (Signed)
Lower extremities weakness, motorized w/c for mobility, continue Neurontin   

## 2016-08-21 NOTE — Assessment & Plan Note (Signed)
Stable, continue Miralax.  

## 2016-09-27 ENCOUNTER — Encounter: Payer: Self-pay | Admitting: Nurse Practitioner

## 2016-09-27 ENCOUNTER — Non-Acute Institutional Stay: Payer: Medicare Other | Admitting: Nurse Practitioner

## 2016-09-27 DIAGNOSIS — D638 Anemia in other chronic diseases classified elsewhere: Secondary | ICD-10-CM

## 2016-09-27 DIAGNOSIS — M3130 Wegener's granulomatosis without renal involvement: Secondary | ICD-10-CM | POA: Diagnosis not present

## 2016-09-27 DIAGNOSIS — F411 Generalized anxiety disorder: Secondary | ICD-10-CM | POA: Diagnosis not present

## 2016-09-27 DIAGNOSIS — K219 Gastro-esophageal reflux disease without esophagitis: Secondary | ICD-10-CM | POA: Diagnosis not present

## 2016-09-27 DIAGNOSIS — I2782 Chronic pulmonary embolism: Secondary | ICD-10-CM

## 2016-09-27 DIAGNOSIS — G600 Hereditary motor and sensory neuropathy: Secondary | ICD-10-CM | POA: Diagnosis not present

## 2016-09-27 DIAGNOSIS — K5900 Constipation, unspecified: Secondary | ICD-10-CM | POA: Diagnosis not present

## 2016-09-27 DIAGNOSIS — H1032 Unspecified acute conjunctivitis, left eye: Secondary | ICD-10-CM

## 2016-09-27 DIAGNOSIS — M1710 Unilateral primary osteoarthritis, unspecified knee: Secondary | ICD-10-CM | POA: Diagnosis not present

## 2016-09-27 NOTE — Assessment & Plan Note (Signed)
Motorized scooter for mobility.

## 2016-09-27 NOTE — Assessment & Plan Note (Signed)
Managed on chronic warfarin

## 2016-09-27 NOTE — Assessment & Plan Note (Signed)
Resolved, left eye, completed 5 day course of Cipro ophthalmic sol.

## 2016-09-27 NOTE — Assessment & Plan Note (Signed)
Chronic cough, stable, on Septra DS MWF indefinitely, Advair, Xopenex prn, Mucinex bid.

## 2016-09-27 NOTE — Assessment & Plan Note (Signed)
Stable, continue Miralax.

## 2016-09-27 NOTE — Assessment & Plan Note (Signed)
Stable, continue Omeprazole 20mg daily.  

## 2016-09-27 NOTE — Assessment & Plan Note (Signed)
prn Lorazepam x total 14 days, effective. Update CBC CMP UA C/S, observe for neurological symptoms.

## 2016-09-27 NOTE — Assessment & Plan Note (Signed)
Lower extremities weakness, motorized w/c for mobility, continue Neurontin

## 2016-09-27 NOTE — Progress Notes (Signed)
Location:  [facility] Room Number: 170 Place of Service:  ALF [phone]) Provider:  Traeson Dusza, Manxie  NP  Jeanmarie Hubert, MD  Patient Care Team: Estill Dooms, MD as PCP - General (Internal Medicine) Unice Bailey, MD as Consulting Physician (Rheumatology) Rozetta Nunnery, MD as Consulting Physician (Otolaryngology) Marcial Pacas, MD as Consulting Physician (Neurology) Michel Bickers, MD as Consulting Physician (Infectious Diseases) Volanda Napoleon, MD as Consulting Physician (Oncology) Juanito Doom, MD as Consulting Physician (Pulmonary Disease) Tanda Rockers, MD as Consulting Physician (Pulmonary Disease) Estill Dooms, MD as Consulting Physician (Geriatric Medicine) Coralie Keens, MD as Consulting Physician (General Surgery) Favor Hackler Otho Darner, NP as Nurse Practitioner (Nurse Practitioner)  Extended Emergency Contact Information Primary Emergency Contact: Haygood,Sandra Address: [address] [place] [address] [place] of [place] Phone: [phone] Relation: Daughter Secondary Emergency Contact: Muckle,[name] Address: [address] [place]          [place], [place] [address] [place] Phone: [phone] Mobile Phone: [phone] Relation: Son  Code Status:  Full Code Goals of care: Advanced Directive information Advanced Directives 09/27/2016  Does Patient Have a Medical Advance Directive? Yes  Type of Paramedic of [place];Living will  Does patient want to make changes to medical advance directive? No - Patient declined  Copy of [place] in Chart? Yes  Would patient like information on creating a medical advance directive? -     Chief Complaint  Patient presents with  . Acute Visit    Having a problem articulating words per daughter.    HPI:  Pt is a 83 y.o. female seen today for an acute visit for the patient's difficulty forming a sensation is not  apparent upon my visit today. No noted focal neurological symptoms, she is afebrile. No O2 desaturation. New med prn Lorazepam 0.5mg  q6h x 14 days for her anxiety since her husband passed away. She has completed 5 day course of Cipro eye drops for left eye conjunctivitis.   Hx of Anemia, Iron infusion 06/15/16 per Hematology. Peripheral neuropathy, w/c for mobility, takes Gabapentin 300mg  tid. GERD symptomatic free on Omeprazole 20mg  daily. Takes Coumadin for PE. Wegeners Granulomatosis, stable, on Septra DS MWF indefinitely, Advair, Xopenex prn, Mucinex bid.      Past Medical History:  Diagnosis Date  . Acute respiratory failure with hypoxia ([place])   . Anemia   . Aspergillosis ([place])   . Bladder incontinence   . Blindness of one eye   . Cancer ([place])    melanoma   ([place]mo)  . Chronic sinusitis   . Clotting disorder ([place])   . Difficult intubation    Difficult airway with intubation for ARF 07/22/14 (due to anterior larynx, also had mucous plug)  . Fatigue 12/09/2015  . Glaucoma   . Hearing loss in left ear    hearing aid both ears  . History of pulmonary embolism 1997  . Interstitial emphysema ([place])   . Left shoulder pain 12/09/2015  . Multiple allergies   . Neuropathy ([place])   . OA (osteoarthritis)   . Pneumonia 12/15  . Shortness of breath dyspnea   . Stroke [facility])    ??? blind left eye  . Wegener's granulomatosis ([place])    Past Surgical History:  Procedure Laterality Date  . bronchosocpy     pt. states she has had over 10 in last 20 yrs.  Marland Kitchen  CATARACT EXTRACTION    . COCHLEAR IMPLANT Left 10/27/2015   Procedure: LEFT COCHLEAR IMPLANT;  Surgeon: Leta Baptist, MD;  Location: [facility];  Service: ENT;  Laterality: Left;  . INGUINAL HERNIA REPAIR Left 02/17/2014   Procedure: LEFT INGUINAL HERNIA REPAIR WITH MESH;  Surgeon: Harl Bowie, MD;  Location: [facility];  Service: General;  Laterality: Left;  . INSERTION OF MESH N/A 02/17/2014   Procedure: INSERTION OF MESH;  Surgeon:  Harl Bowie, MD;  Location: [facility];  Service: General;  Laterality: N/A;  . KNEE ARTHROSCOPY  2006   rt   . MELANOMA EXCISION     left leg  . PUBOVAGINAL SLING    . TEAR DUCT PROBING    . TUBAL LIGATION    . TYMPANOMASTOIDECTOMY Left 04/28/2015  . TYMPANOMASTOIDECTOMY Left 04/28/2015   Procedure: LEFT TYMPANOMASTOIDECTOMY;  Surgeon: Leta Baptist, MD;  Location: [facility];  Service: ENT;  Laterality: Left;  Marland Kitchen VENA CAVA FILTER PLACEMENT  1997   [place] filter    Allergies  Allergen Reactions  . Adhesive [Tape] Rash    On on lower extremities     Allergies as of 09/27/2016      Reactions   Adhesive [tape] Rash   On on lower extremities       Medication List       Accurate as of 09/27/16  5:22 PM. Always use your most recent med list.          acetaminophen 650 MG CR tablet Commonly known as:  TYLENOL Take 650 mg by mouth 2 (two) times daily. Take 2 tablets once a day 8:00 pm   b complex vitamins capsule Take 1 capsule by mouth daily.   calcium-vitamin D 500-200 MG-UNIT tablet Commonly known as:  OSCAL WITH D Take 1 tablet by mouth daily.   cholecalciferol 1000 units tablet Commonly known as:  VITAMIN D One daily for vitamin D supplement   diclofenac sodium 1 % Gel Commonly known as:  VOLTAREN Apply 2 g topically 3 (three) times daily. Apply to left shoulder.   Fluticasone-Salmeterol 500-50 MCG/DOSE Aepb Commonly known as:  ADVAIR DISKUS INHALE 1 PUFF EVERY 12 HOURS.   gabapentin 300 MG capsule Commonly known as:  NEURONTIN Take 300 mg by mouth 3 (three) times daily.   guaiFENesin 600 MG 12 hr tablet Commonly known as:  MUCINEX Take 600 mg by mouth 2 (two) times daily.   latanoprost 0.005 % ophthalmic solution Commonly known as:  XALATAN 1 drop. One drop both eyes at bedtime   levalbuterol 0.63 MG/3ML nebulizer solution Commonly known as:  XOPENEX Take 0.63 mg by nebulization every 6 (six) hours as needed for wheezing or shortness  of breath.   LORazepam 0.5 MG tablet Commonly known as:  ATIVAN Take 0.5 mg by mouth every 6 (six) hours as needed for anxiety.   mupirocin cream 2 % Commonly known as:  BACTROBAN Apply 1 application topically daily. Apply to the right lateral malleolus  wound area on with daily dressing change.   omeprazole 20 MG capsule Commonly known as:  PRILOSEC Take 20 mg by mouth daily.   PREPARATION H EX Apply topically. Apply ointment for hemorrhoids four times a day as needed for discomfort   saccharomyces boulardii 250 MG capsule Commonly known as:  FLORASTOR Take 250 mg by mouth. Take one capsule once a day   SORE THROAT SPRAY 1.4 % Liqd Generic drug:  phenol Use as directed 1 spray  in the mouth or throat as needed for throat irritation / pain.   sulfamethoxazole-trimethoprim 800-160 MG tablet Commonly known as:  BACTRIM DS,SEPTRA DS Take 1 tablet by mouth every Monday, Wednesday, and Friday. HOLD WHILE ON AUGMENTIN, THEN RESUME AS PREVIOUS   THERAVIM-M Tabs Take 1 tablet by mouth daily.   timolol 0.5 % ophthalmic solution Commonly known as:  BETIMOL Place 1 drop into the left eye daily.   warfarin 4 MG tablet Commonly known as:  COUMADIN Take 4 mg by mouth daily at 6 PM. Take once daily on Sun, Mon, Tues and Wed.       Review of Systems  Constitutional: Positive for fatigue. Negative for chills, diaphoresis and fever.       Frail and generally weak.  HENT: Positive for hearing loss (Deaf on left, hearing aid on right ). Negative for congestion, ear discharge, ear pain, nosebleeds, sore throat and tinnitus.        Left cochlear implant in place and has a magnet that she applies to the left parietal scalp associated with the cochlear implant.  Eyes: Positive for discharge (OS). Negative for pain and redness (OS).  Respiratory: Positive for cough and shortness of breath. Negative for wheezing (Intermittent).        History Wegener's granulomatosis. History of chronic  sinusitis related to her Wegener's. Chronic cough and yellowish phlegm production   Cardiovascular: Positive for leg swelling. Negative for chest pain and palpitations.       Trace R+L foot/ankle, R>L  Gastrointestinal: Negative for abdominal pain, constipation, diarrhea and nausea.       History of GERD.  Endocrine: Negative for polydipsia.  Genitourinary: Positive for frequency. Negative for dysuria, flank pain, hematuria and urgency.       Frequency induced by furosemide. Episodes of incontinence of urine.  Musculoskeletal: Positive for arthralgias and gait problem. Negative for back pain, myalgias and neck pain.       Generalized weakness Left upper extremity pain, limited ROM of the left shoulder  Skin: Positive for wound. Negative for rash.       the lateral right malleolus wound, scabbed over, the left heel scabbed x2 from previous blood filled blisters  Neurological: Positive for weakness. Negative for dizziness, tremors, seizures and headaches.       Neuropathic leg/foot pain. Generalized weakness BLE  Hematological: Does not bruise/bleed easily.       Chronic anemia.  Psychiatric/Behavioral: Negative for hallucinations and suicidal ideas. The patient is nervous/anxious.        Husband is in SNF    Immunization History  Administered Date(s) Administered  . Influenza Split 03/04/2012, 03/11/2014, 04/01/2015  . Influenza,inj,Quad PF,36+ Mos 03/03/2013  . Influenza-Unspecified 04/01/2015, 04/19/2016  . PPD Test 05/25/2015  . Pneumococcal Conjugate-13 07/01/2015  . Pneumococcal Polysaccharide-23 09/02/2015  . Zoster 09/30/1994   Pertinent  Health Maintenance Due  Topic Date Due  . INFLUENZA VACCINE  Completed  . DEXA SCAN  Completed  . PNA vac Low Risk Adult  Completed   Fall Risk  12/09/2015 09/23/2015 09/09/2015 02/03/2015 12/04/2014  Falls in the past year? No Yes Yes Yes Yes  Number falls in past yr: - 2 or more 2 or more 2 or more 2 or more  Injury with Fall? - No No - No    Functional Status Survey:    Vitals:   09/27/16 1016  Resp: 18  Temp: 98.2 F (36.8 C)  Weight: 141 lb (64 kg)  Height: 5\' 1"  (1.549 m)  Body mass index is 26.64 kg/m. Physical Exam  Constitutional: She is oriented to person, place, and time. She appears well-developed. No distress.  Thin and frail. Weight stable  since 3/16  HENT:  Head: Normocephalic and atraumatic.  Nose: Nose normal.  Mouth/Throat: No oropharyngeal exudate.  Bilateral hearing loss. Hearing aid - right ear Pending left cochlear implant. Erythema of the pharynx.  Eyes: EOM are normal. Pupils are equal, round, and reactive to light. Right eye exhibits no discharge. Left eye exhibits no discharge. No scleral icterus.  Left conjunctiva quite red and bloodshot. There is a whitish exudate as well.  Neck: Normal range of motion. Neck supple. No JVD present. No tracheal deviation present. No thyromegaly present.  Cardiovascular: Normal rate, regular rhythm, normal heart sounds and intact distal pulses.   No murmur heard. Absent pedal pulses, the lateral right malleolus wound, not infected, tender, very mild warmth and redness.   Pulmonary/Chest: Effort normal. No stridor. No respiratory distress. She has no wheezes. She has rales. She exhibits no tenderness.  Abdominal: Soft. Bowel sounds are normal. She exhibits no distension and no mass. There is no tenderness. There is no rebound and no guarding.  Musculoskeletal: Normal range of motion. She exhibits edema (trace) and tenderness.  R+L foot/ankle, R>L, trace to 1+ Left upper extremity pain. L shoulder limited ROM  Lymphadenopathy:    She has no cervical adenopathy.  Neurological: She is alert and oriented to person, place, and time. She has normal reflexes. No cranial nerve deficit. She exhibits normal muscle tone. Coordination normal.  The patient has history of neuropathy of bilateral lower extremities which resulted in generalized weakness and pain of legs,  she is only able to ambulates a few steps with walker in her room and depend on w/c to go further. In order to maintain her quality of life and mobility, she needs wheelchair to travel to dinning room for meals and attend activities on the [place] where she resides. Due to her left upper extremity pain related to an injury occurred 4-5 years, she is unable to propel manual wheelchair or steer a scooter in her living space to maintain her mobility. Mrs Parish manages her own affairs and finances with minimal assistance. She has worked with therapy in the past and has been deemed to be safe to operate a power chair.    Skin: Skin is warm and dry. No rash noted. She is not diaphoretic. No erythema. No pallor.  Left 2nd toe overlaps the great toe. R lateral malleolus scab over from previous contusion. Left heel 2x scabbed over areas from previous blisters.    Psychiatric: She has a normal mood and affect. Her behavior is normal. Judgment and thought content normal.    Labs reviewed:  Recent Labs  11/26/15 02/08/16 06/29/16  NA 137 141 136*  K 4.1 4.1 4.1  BUN 13 10 13   CREATININE 0.8 0.8 0.9    Recent Labs  11/26/15 02/08/16  AST 33 23  ALT 20 17  ALKPHOS 171* 127*    Recent Labs  10/27/15 0711  12/02/15 1327 02/08/16 06/01/16 1247 06/29/16  WBC 4.4  < > 5.3 4.0 5.3 7.3  NEUTROABS  --   --  3.3  --  3.4  --   HGB 10.7*  < > 12.1 11.7* 12.9 11.3*  HCT 34.2*  < > 37.0 37 39.6 35*  MCV 84.0  --  88  --  91  --   PLT 324  < >  303 256 276 228  < > = values in this interval not displayed. Lab Results  Component Value Date   TSH 1.56 05/13/2015   Lab Results  Component Value Date   HGBA1C 5.8 (H) 07/28/2014   No results found for: CHOL, HDL, LDLCALC, LDLDIRECT, TRIG, CHOLHDL  Significant Diagnostic Results in last 30 days:  No results found.  Assessment/Plan Generalized anxiety disorder prn Lorazepam x total 14 days, effective. Update CBC CMP UA C/S, observe for  neurological symptoms.   Conjunctivitis Resolved, left eye, completed 5 day course of Cipro ophthalmic sol.   Anemia of chronic disease Update CBC, last Hgb 11.3 06/29/16, hx of Iron infusion.   WEGENERS GRANULOMATOSIS Chronic cough, stable, on Septra DS MWF indefinitely, Advair, Xopenex prn, Mucinex bid.   Pulmonary embolism ([place]) Managed on chronic warfarin    Constipation Stable, continue Miralax.    GERD (gastroesophageal reflux disease) Stable, continue Omeprazole 20mg  daily   Peripheral neuropathy (HCC) Lower extremities weakness, motorized w/c for mobility, continue Neurontin    Osteoarthrosis Motorized scooter for mobility.      Family/ staff Communication:  AL  Labs/tests ordered:  CBC CMP UA C/S

## 2016-09-27 NOTE — Assessment & Plan Note (Signed)
Update CBC, last Hgb 11.3 06/29/16, hx of Iron infusion.

## 2016-09-28 ENCOUNTER — Encounter: Payer: Self-pay | Admitting: Internal Medicine

## 2016-09-28 ENCOUNTER — Non-Acute Institutional Stay: Payer: Medicare Other | Admitting: Internal Medicine

## 2016-09-28 DIAGNOSIS — S9001XA Contusion of right ankle, initial encounter: Secondary | ICD-10-CM

## 2016-09-28 DIAGNOSIS — D638 Anemia in other chronic diseases classified elsewhere: Secondary | ICD-10-CM | POA: Diagnosis not present

## 2016-09-28 DIAGNOSIS — R269 Unspecified abnormalities of gait and mobility: Secondary | ICD-10-CM | POA: Diagnosis not present

## 2016-09-28 DIAGNOSIS — H1032 Unspecified acute conjunctivitis, left eye: Secondary | ICD-10-CM

## 2016-09-28 LAB — HEPATIC FUNCTION PANEL
ALT: 15 U/L (ref 7–35)
AST: 22 U/L (ref 13–35)
Alkaline Phosphatase: 115 U/L (ref 25–125)
Bilirubin, Total: 0.3 mg/dL

## 2016-09-28 LAB — BASIC METABOLIC PANEL WITH GFR
BUN: 13 mg/dL (ref 4–21)
Creatinine: 0.8 mg/dL (ref <=1.1)
Glucose: 92 mg/dL
Potassium: 4.2 mmol/L (ref 3.4–5.3)
Sodium: 139 mmol/L (ref 137–147)

## 2016-09-28 LAB — CBC AND DIFFERENTIAL
HCT: 35 % — AB (ref 36–46)
Hemoglobin: 11.3 g/dL — AB (ref 12.0–16.0)
PLATELETS: 238 10*3/uL (ref 150–399)
WBC: 4.1 10^3/mL

## 2016-09-28 LAB — PROTIME-INR: Protime: 13.7 seconds (ref 10.0–13.8)

## 2016-09-28 LAB — POCT INR: INR: 1.3 — AB (ref <=1.1)

## 2016-09-28 NOTE — Progress Notes (Signed)
Progress Note    Location:  [facility] [facility] Room Number: [phone] Place of Service:  ALF [phone]) Provider:  Jeanmarie Hubert, MD  Patient Care Team: Estill Dooms, MD as PCP - General (Internal Medicine) Unice Bailey, MD as Consulting Physician (Rheumatology) Rozetta Nunnery, MD as Consulting Physician (Otolaryngology) Marcial Pacas, MD as Consulting Physician (Neurology) Michel Bickers, MD as Consulting Physician (Infectious Diseases) Volanda Napoleon, MD as Consulting Physician (Oncology) Juanito Doom, MD as Consulting Physician (Pulmonary Disease) Tanda Rockers, MD as Consulting Physician (Pulmonary Disease) Estill Dooms, MD as Consulting Physician (Geriatric Medicine) Coralie Keens, MD as Consulting Physician (General Surgery) Man Otho Darner, NP as Nurse Practitioner (Nurse Practitioner)  Extended Emergency Contact Information Primary Emergency Contact: Haygood,Sandra Address: [address] [place] [address] Johnnette Litter of [place] Phone: [phone] Relation: Daughter Secondary Emergency Contact: Pluta,[name] Address: [address] [place]          [place], [place] [address] [place] Phone: [phone] Mobile Phone: [phone] Relation: Son  Code Status:  Full Code Goals of care: Advanced Directive information Advanced Directives 09/28/2016  Does Patient Have a Medical Advance Directive? Yes  Type of Paramedic of [place];Living will  Does patient want to make changes to medical advance directive? -  Copy of [place] in Chart? Yes  Would patient like information on creating a medical advance directive? -     Chief Complaint  Patient presents with  . Medical Management of Chronic Issues    routine visit    HPI:  Pt is a 83 y.o. female seen today for medical management of chronic diseases.   Acute problem of left conjunctivitis. Has been treated  with cipro ophth solution, but there was little improvement. She has appt with her ophth next week. Eye remains inflamed, but without pain. She has a yellow sticky discharge.   She is doing well otherwise. Has no respiratory complaints. Sinuses are OK.  Continues to use her motorized chair for mobility due to weakness and gait instability.    Past Medical History:  Diagnosis Date  . Acute respiratory failure with hypoxia ([place])   . Anemia   . Aspergillosis ([place])   . Bladder incontinence   . Blindness of one eye   . Cancer ([place])    melanoma   ([place])  . Chronic sinusitis   . Clotting disorder ([place])   . Difficult intubation    Difficult airway with intubation for ARF 07/22/14 (due to anterior larynx, also had mucous plug)  . Fatigue 12/09/2015  . Glaucoma   . Hearing loss in left ear    hearing aid both ears  . History of pulmonary embolism 1997  . Interstitial emphysema ([place])   . Left shoulder pain 12/09/2015  . Multiple allergies   . Neuropathy ([place])   . OA (osteoarthritis)   . Pneumonia 12/15  . Shortness of breath dyspnea   . Stroke [facility])    ??? blind left eye  . Wegener's granulomatosis ([place])    Past Surgical History:  Procedure Laterality Date  . bronchosocpy     pt. states she has had over 10 in last 20 yrs.  Marland Kitchen CATARACT EXTRACTION    . COCHLEAR IMPLANT Left 10/27/2015   Procedure: LEFT COCHLEAR IMPLANT;  Surgeon: Leta Baptist, MD;  Location: [facility];  Service: ENT;  Laterality: Left;  .  INGUINAL HERNIA REPAIR Left 02/17/2014   Procedure: LEFT INGUINAL HERNIA REPAIR WITH MESH;  Surgeon: Harl Bowie, MD;  Location: [facility];  Service: General;  Laterality: Left;  . INSERTION OF MESH N/A 02/17/2014   Procedure: INSERTION OF MESH;  Surgeon: Harl Bowie, MD;  Location: [facility];  Service: General;  Laterality: N/A;  . KNEE ARTHROSCOPY  2006   rt   . MELANOMA EXCISION     left leg  . PUBOVAGINAL SLING    . TEAR DUCT PROBING    .  TUBAL LIGATION    . TYMPANOMASTOIDECTOMY Left 04/28/2015  . TYMPANOMASTOIDECTOMY Left 04/28/2015   Procedure: LEFT TYMPANOMASTOIDECTOMY;  Surgeon: Leta Baptist, MD;  Location: [facility];  Service: ENT;  Laterality: Left;  Marland Kitchen VENA CAVA FILTER PLACEMENT  1997   [place] filter    Allergies  Allergen Reactions  . Adhesive [Tape] Rash    On on lower extremities     Allergies as of 09/28/2016      Reactions   Adhesive [tape] Rash   On on lower extremities       Medication List       Accurate as of 09/28/16 11:40 AM. Always use your most recent med list.          acetaminophen 650 MG CR tablet Commonly known as:  TYLENOL Take 650 mg by mouth. Take 2 every four hours for fever greater than 100.3. Take 2 tablets once a day; take 2 tablets twice daily as needed for pain   b complex vitamins capsule Take 1 capsule by mouth daily.   calcium-vitamin D 500-200 MG-UNIT tablet Commonly known as:  OSCAL WITH D Take 1 tablet by mouth daily.   cholecalciferol 1000 units tablet Commonly known as:  VITAMIN D One daily for vitamin D supplement   diclofenac sodium 1 % Gel Commonly known as:  VOLTAREN Apply 2 g topically 3 (three) times daily. Apply to left shoulder.   Fluticasone-Salmeterol 500-50 MCG/DOSE Aepb Commonly known as:  ADVAIR DISKUS INHALE 1 PUFF EVERY 12 HOURS.   gabapentin 300 MG capsule Commonly known as:  NEURONTIN Take 300 mg by mouth 3 (three) times daily.   guaiFENesin 600 MG 12 hr tablet Commonly known as:  MUCINEX Take 600 mg by mouth 2 (two) times daily.   latanoprost 0.005 % ophthalmic solution Commonly known as:  XALATAN 1 drop. One drop both eyes at bedtime   levalbuterol 0.63 MG/3ML nebulizer solution Commonly known as:  XOPENEX Take 0.63 mg by nebulization every 6 (six) hours as needed for wheezing or shortness of breath.   LORazepam 0.5 MG tablet Commonly known as:  ATIVAN Take 0.5 mg by mouth every 6 (six) hours as needed for anxiety.   mupirocin  cream 2 % Commonly known as:  BACTROBAN Apply 1 application topically daily. Apply to the right lateral malleolus  wound area on with daily dressing change.   NAPHCON-A 0.025-0.3 % ophthalmic solution Generic drug:  naphazoline-pheniramine 1 drop. One drop in left eye three times a day as needed for redness   omeprazole 20 MG capsule Commonly known as:  PRILOSEC Take 20 mg by mouth daily.   PREPARATION H EX Apply topically. Apply ointment for hemorrhoids four times a day as needed for discomfort   saccharomyces boulardii 250 MG capsule Commonly known as:  FLORASTOR Take 250 mg by mouth. Take one capsule once a day   SORE THROAT SPRAY 1.4 % Liqd Generic drug:  phenol  Use as directed 1 spray in the mouth or throat as needed for throat irritation / pain.   sulfamethoxazole-trimethoprim 800-160 MG tablet Commonly known as:  BACTRIM DS,SEPTRA DS Take 1 tablet by mouth every Monday, Wednesday, and Friday. HOLD WHILE ON AUGMENTIN, THEN RESUME AS PREVIOUS   THERAVIM-M Tabs Take 1 tablet by mouth daily.   timolol 0.5 % ophthalmic solution Commonly known as:  BETIMOL Place 1 drop into the left eye daily.   warfarin 4 MG tablet Commonly known as:  COUMADIN Take 4 mg by mouth daily at 6 PM. Take once daily on Sun, Mon, Tues and Wed.       Review of Systems  Constitutional: Positive for fever. Negative for chills and diaphoresis.       Frail and generally weak.  HENT: Positive for hearing loss (Deaf on left, hearing aid on right ), nosebleeds and sore throat. Negative for congestion, ear discharge, ear pain and tinnitus.        Left cochlear implant in place and has a magnet that she applies to the left parietal scalp associated with the cochlear implant.  Eyes: Positive for discharge (OS) and redness (OS). Negative for pain.  Respiratory: Positive for cough and shortness of breath. Negative for wheezing (Intermittent).        History Wegener's granulomatosis. History of chronic  sinusitis related to her Wegener's. Chronic cough and yellowish phlegm production   Cardiovascular: Positive for leg swelling. Negative for chest pain and palpitations.       Trace R+L foot/ankle, R>L  Gastrointestinal: Negative for abdominal pain, constipation, diarrhea and nausea.       History of GERD.  Endocrine: Negative for polydipsia.  Genitourinary: Positive for frequency. Negative for dysuria, flank pain, hematuria and urgency.       Frequency induced by furosemide. Episodes of incontinence of urine.  Musculoskeletal: Positive for arthralgias and gait problem. Negative for back pain, myalgias and neck pain.       Generalized weakness Left upper extremity pain, limited ROM of the left shoulder  Skin: Positive for wound. Negative for rash.       Painfu uUlcer of the left 2nd toe medially.  Neurological: Positive for weakness. Negative for dizziness, tremors, seizures and headaches.       Neuropathic leg/foot pain. Generalized weakness BLE  Hematological: Does not bruise/bleed easily.       Chronic anemia.  Psychiatric/Behavioral: Negative for hallucinations and suicidal ideas. The patient is nervous/anxious.        Husband is in SNF    Immunization History  Administered Date(s) Administered  . Influenza Split 03/04/2012, 03/11/2014, 04/01/2015  . Influenza,inj,Quad PF,36+ Mos 03/03/2013  . Influenza-Unspecified 04/01/2015, 04/19/2016  . PPD Test 05/25/2015  . Pneumococcal Conjugate-13 07/01/2015  . Pneumococcal Polysaccharide-23 09/02/2015  . Zoster 09/30/1994   Pertinent  Health Maintenance Due  Topic Date Due  . INFLUENZA VACCINE  Completed  . DEXA SCAN  Completed  . PNA vac Low Risk Adult  Completed   Fall Risk  12/09/2015 09/23/2015 09/09/2015 02/03/2015 12/04/2014  Falls in the past year? No Yes Yes Yes Yes  Number falls in past yr: - 2 or more 2 or more 2 or more 2 or more  Injury with Fall? - No No - No   Functional Status Survey:    Vitals:   09/28/16 1127  BP:  136/78  Pulse: 74  Resp: 18  Temp: 98.5 F (36.9 C)  Weight: 141 lb (64 kg)  Height: 5\' 1"  (  1.549 m)   Body mass index is 26.64 kg/m. Physical Exam  Constitutional: She is oriented to person, place, and time. She appears well-developed. No distress.  Thin and frail. Weight stable  since 3/16  HENT:  Head: Normocephalic and atraumatic.  Nose: Nose normal.  Mouth/Throat: No oropharyngeal exudate.  Bilateral hearing loss. Hearing aid - right ear Pending left cochlear implant. Erythema of the pharynx.  Eyes: EOM are normal. Pupils are equal, round, and reactive to light. Right eye exhibits no discharge. Left eye exhibits no discharge. No scleral icterus.  Left conjunctiva quite red and bloodshot. There is a yellow sticky exudate as well.  Neck: Normal range of motion. Neck supple. No JVD present. No tracheal deviation present. No thyromegaly present.  Cardiovascular: Normal rate, regular rhythm, normal heart sounds and intact distal pulses.   No murmur heard. Pulmonary/Chest: Effort normal. No stridor. No respiratory distress. She has wheezes. She has rales. She exhibits no tenderness.  Abdominal: Soft. Bowel sounds are normal. She exhibits no distension and no mass. There is no tenderness. There is no rebound and no guarding.  Musculoskeletal: Normal range of motion. She exhibits edema (trace) and tenderness.  R+L foot/ankle, R>L, trace to 1+ Left upper extremity pain. L shoulder limited ROM  Lymphadenopathy:    She has no cervical adenopathy.  Neurological: She is alert and oriented to person, place, and time. She has normal reflexes. No cranial nerve deficit. She exhibits normal muscle tone. Coordination normal.  The patient has history of neuropathy of bilateral lower extremities which resulted in generalized weakness and pain of legs, she is only able to ambulates a few steps with walker in her room and depend on w/c to go further. In order to maintain her quality of life and  mobility, she needs wheelchair to travel to dinning room for meals and attend activities on the [place] where she resides. Due to her left upper extremity pain related to an injury occurred 4-5 years, she is unable to propel manual wheelchair or steer a scooter in her living space to maintain her mobility. Mrs Nored manages her own affairs and finances with minimal assistance. She has worked with therapy in the past and has been deemed to be safe to operate a power chair.   Skin: Skin is warm and dry. No rash noted. She is not diaphoretic. No erythema. No pallor.  Painful ulcer of the left 2nd toe medially.Left 2nd toe overlaps the great toe.  Psychiatric: She has a normal mood and affect. Her behavior is normal. Judgment and thought content normal.    Labs reviewed:  Recent Labs  11/26/15 02/08/16 06/29/16  NA 137 141 136*  K 4.1 4.1 4.1  BUN 13 10 13   CREATININE 0.8 0.8 0.9    Recent Labs  11/26/15 02/08/16  AST 33 23  ALT 20 17  ALKPHOS 171* 127*    Recent Labs  10/27/15 0711  12/02/15 1327 02/08/16 06/01/16 1247 06/29/16  WBC 4.4  < > 5.3 4.0 5.3 7.3  NEUTROABS  --   --  3.3  --  3.4  --   HGB 10.7*  < > 12.1 11.7* 12.9 11.3*  HCT 34.2*  < > 37.0 37 39.6 35*  MCV 84.0  --  88  --  91  --   PLT 324  < > 303 256 276 228  < > = values in this interval not displayed. Lab Results  Component Value Date   TSH 1.56 05/13/2015  Lab Results  Component Value Date   HGBA1C 5.8 (H) 07/28/2014    Assessment/Plan 1. Acute bacterial conjunctivitis of left eye -culture eye drainage  2. Contusion of right ankle, initial encounter Continue current treatment  3. Abnormality of gait Continue to use motorized chair to help with safe mobility  4. Anemia of chronic disease Follow up in the future.

## 2016-10-02 ENCOUNTER — Other Ambulatory Visit: Payer: Self-pay | Admitting: *Deleted

## 2016-10-05 DIAGNOSIS — H34232 Retinal artery branch occlusion, left eye: Secondary | ICD-10-CM | POA: Insufficient documentation

## 2016-10-05 DIAGNOSIS — H16002 Unspecified corneal ulcer, left eye: Secondary | ICD-10-CM | POA: Insufficient documentation

## 2016-10-11 ENCOUNTER — Other Ambulatory Visit: Payer: Self-pay | Admitting: Internal Medicine

## 2016-10-11 DIAGNOSIS — M3130 Wegener's granulomatosis without renal involvement: Secondary | ICD-10-CM

## 2016-10-12 ENCOUNTER — Other Ambulatory Visit: Payer: Self-pay | Admitting: *Deleted

## 2016-10-12 LAB — PROTIME-INR: PROTIME: 41.2 s — AB (ref 10.0–13.8)

## 2016-10-12 LAB — POCT INR: INR: 4.1 — AB (ref <=1.1)

## 2016-10-16 ENCOUNTER — Ambulatory Visit
Admission: RE | Admit: 2016-10-16 | Discharge: 2016-10-16 | Disposition: A | Discharge status: Home / Self Care | Payer: Medicare Other | Source: Ambulatory Visit | Attending: Internal Medicine | Admitting: Internal Medicine

## 2016-10-16 DIAGNOSIS — M3130 Wegener's granulomatosis without renal involvement: Secondary | ICD-10-CM

## 2016-10-25 IMAGING — CT CT TEMPORAL BONES W/O CM
4 of 7 series · 16 of 30 positions shown, 17 images · non-contrast
Comparison: CT temporal bones from 05/08/2008.

CLINICAL DATA: Complete LEFT ear hearing loss, severe hearing loss
RIGHT ear.

EXAM:
CT TEMPORAL BONES WITHOUT CONTRAST
TECHNIQUE: Axial and coronal plane CT imaging of the petrous temporal bones was
performed with thin-collimation image reconstruction. No intravenous
contrast was administered. Multiplanar CT image reconstructions were
also generated.

[Series 3: ax mag right · axial · 0.20mm/px · z∈[+3,+25]mm · 3 of 141 slices shown]
[im 36/141  bone]
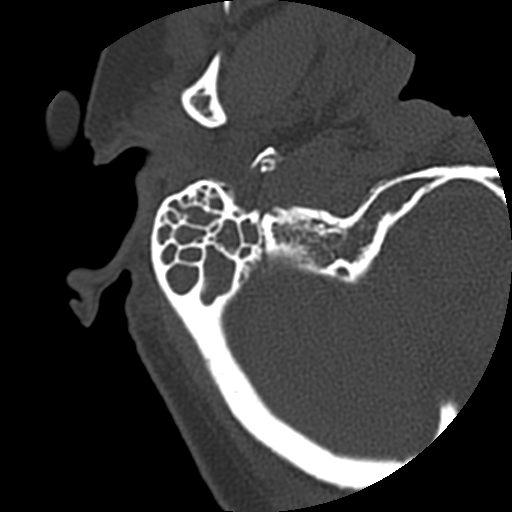
[im 71/141  bone]
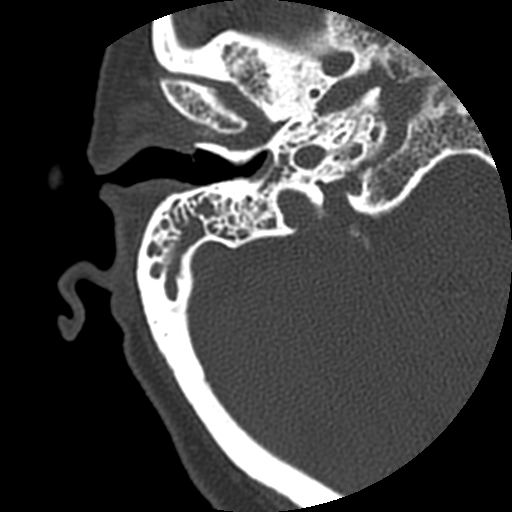
[im 106/141  bone]
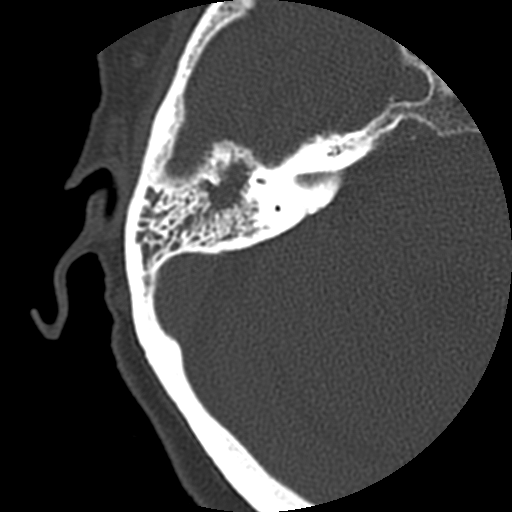

[Series 200: cor bone · axial · 0.33mm/px · z∈[+14,+99]mm · 3 of 76 slices shown, 4 images]
[im 1/76  brain]
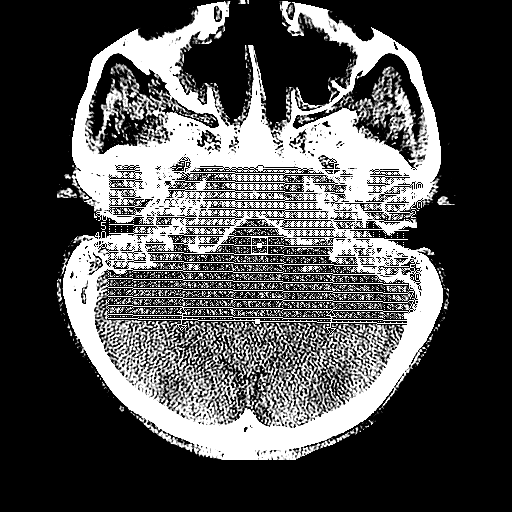
[im 1/76  bone]
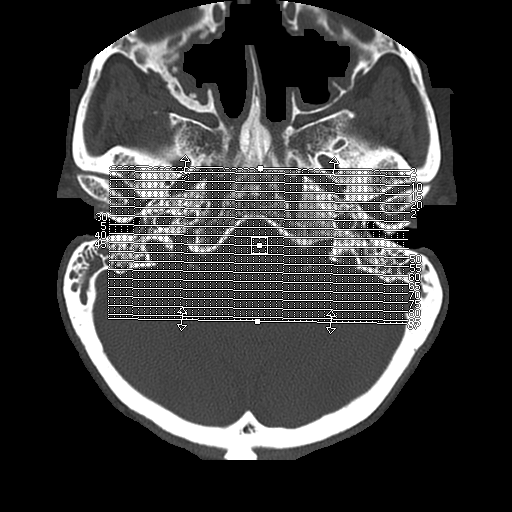
[im 38/76  bone]
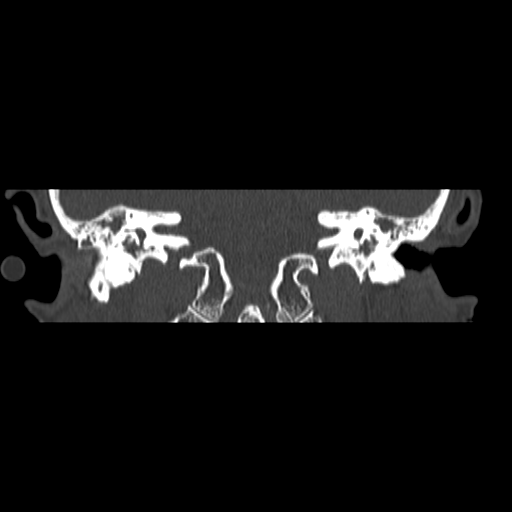
[im 76/76  bone]
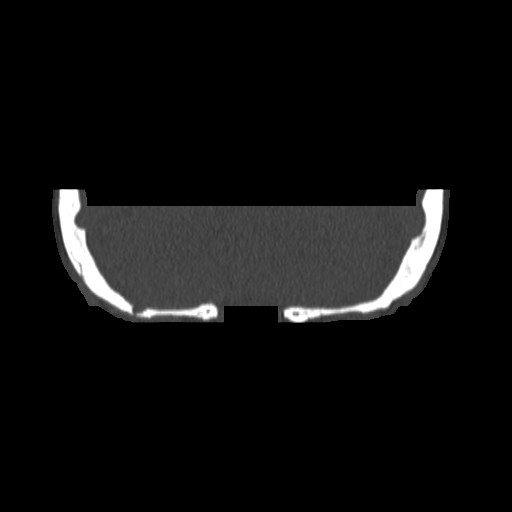

[Series 300: cor mag right · coronal · 0.20mm/px · 5 of 188 slices shown]
[im 32/188  bone]
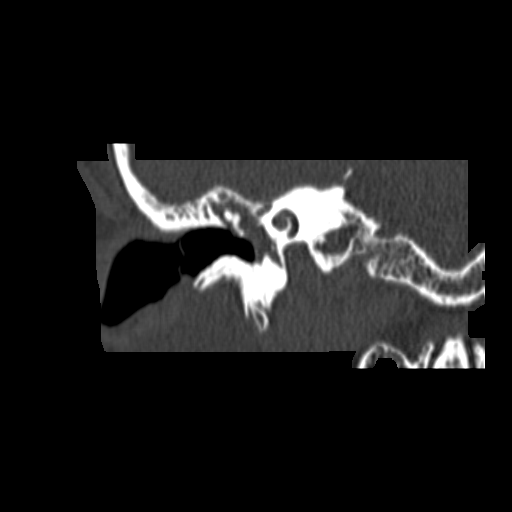
[im 63/188  bone]
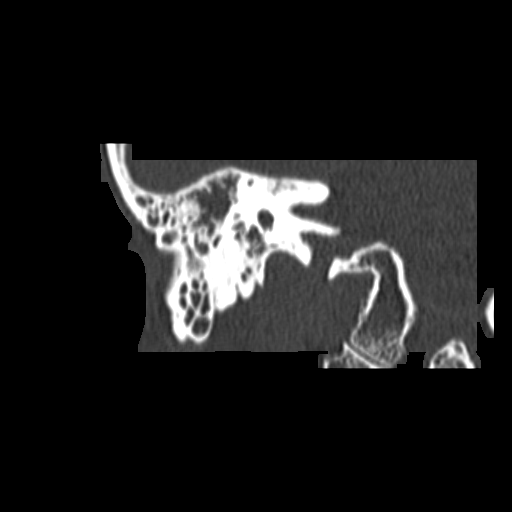
[im 94/188  bone]
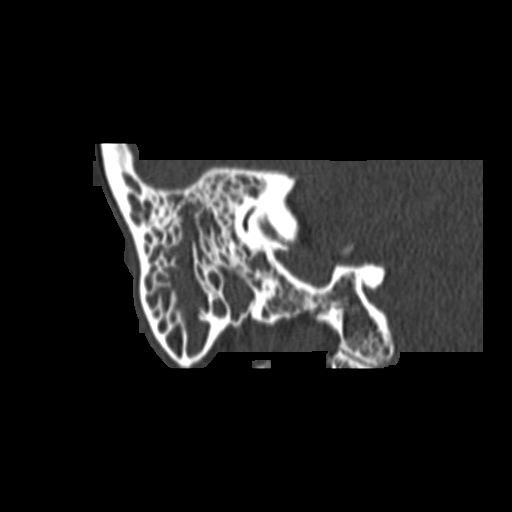
[im 125/188  bone]
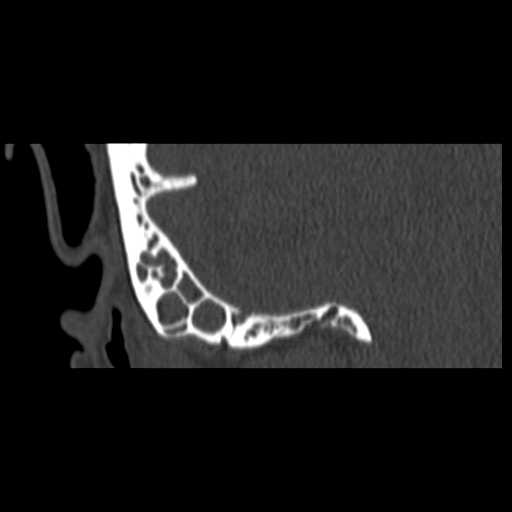
[im 156/188  bone]
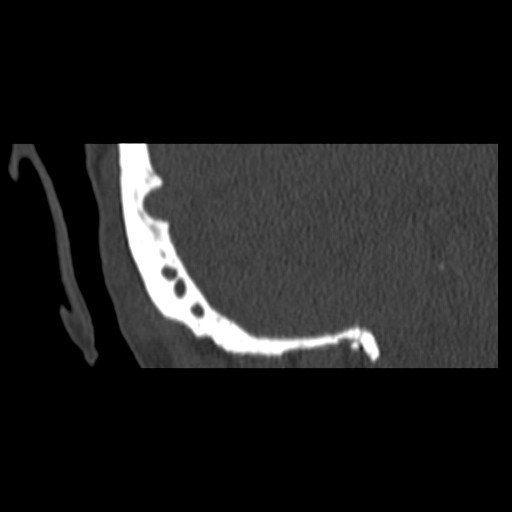

[Series 400: cor mag left · coronal · 0.20mm/px · 5 of 182 slices shown]
[im 31/182  bone]
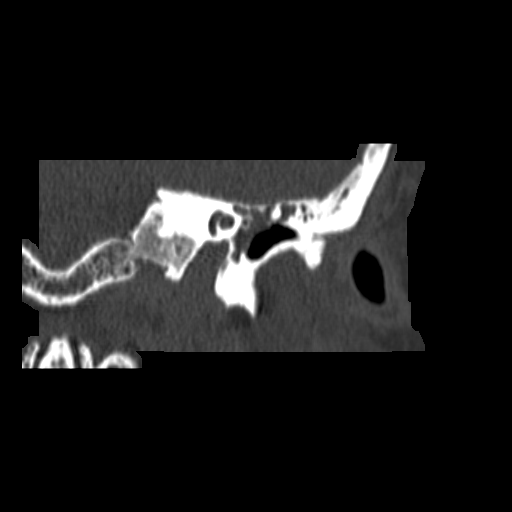
[im 61/182  bone]
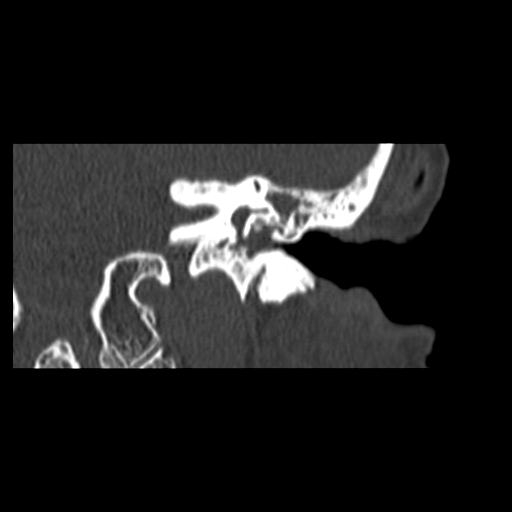
[im 91/182  bone]
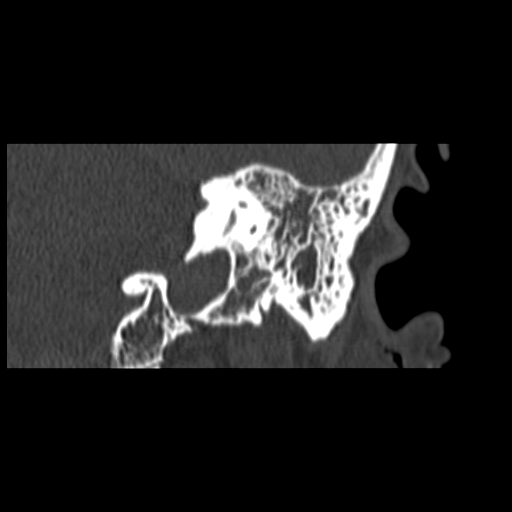
[im 121/182  bone]
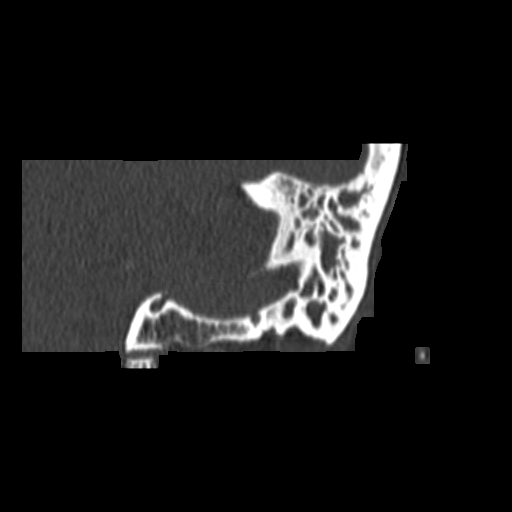
[im 151/182  bone]
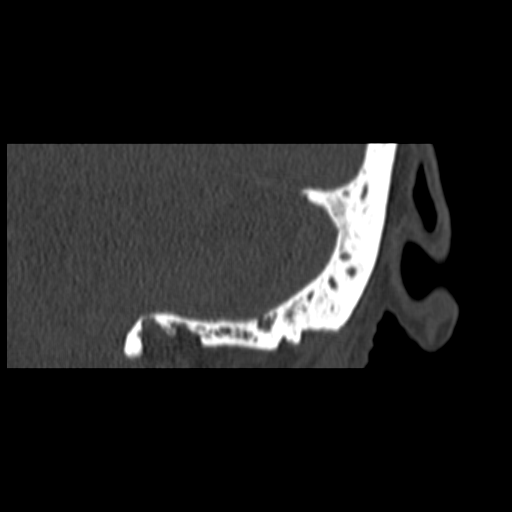

[16 of 30 positions shown; findings below may reference images not displayed]

FINDINGS: Normal LEFT external canal. Retracted LEFT tympanic membrane. Fluid
and/or soft tissue throughout the middle ear without definite
ossicular destruction. Soft tissue in the attic, with slight
thinning of the tegmen tympani. Slight erosion of the scutum. Soft
tissue extends posteriorly widening the aditus ad antrum with
mastoid air cell coalescence. Tegmen mastoideum is segmentally
thinned, and potentially dehiscent. Normal IAC and inner ear.

Normal RIGHT external canal. Slightly retracted RIGHT TM. Fluid
and/or soft tissue fills the middle ear extending into the attic. No
ossicular obstruction. Scutum intact. Tegmen tympani intact. Soft
tissue extends through the attic this at antrum into the mastoid
where there is early coalescence. Tegmen mastoideum intact. No
labyrinthine or IAC abnormality.

Visualized intracranial structures unremarkable. Moderately advanced
chronic sinus disease with postsurgical changes both maxillary
sinuses. Mucosal thickening and retention cyst formation can be seen
in both maxillary sinuses, with suspected air-fluid level on the
RIGHT. Chronic osteitis surrounding the maxillary, and sphenoid
sinuses as well as posterior ethmoid sinuses.

Compared with prior study, there is worsening involvement on the
LEFT and new involvement on the RIGHT. Is wall
IMPRESSION: BILATERAL middle ear and mastoid cholesteatomas are suspected. These
changes are chronic and worse on the LEFT. See discussion above.

## 2016-10-26 ENCOUNTER — Non-Acute Institutional Stay: Payer: Medicare Other | Admitting: Internal Medicine

## 2016-10-26 ENCOUNTER — Encounter: Payer: Self-pay | Admitting: Internal Medicine

## 2016-10-26 VITALS — BP 124/76 | HR 81 | Temp 98.4°F | Ht 61.0 in | Wt 146.0 lb

## 2016-10-26 DIAGNOSIS — M3130 Wegener's granulomatosis without renal involvement: Secondary | ICD-10-CM

## 2016-10-26 DIAGNOSIS — E663 Overweight: Secondary | ICD-10-CM | POA: Diagnosis not present

## 2016-10-26 DIAGNOSIS — H1032 Unspecified acute conjunctivitis, left eye: Secondary | ICD-10-CM

## 2016-10-26 DIAGNOSIS — R269 Unspecified abnormalities of gait and mobility: Secondary | ICD-10-CM | POA: Diagnosis not present

## 2016-10-26 DIAGNOSIS — D638 Anemia in other chronic diseases classified elsewhere: Secondary | ICD-10-CM

## 2016-10-26 DIAGNOSIS — N3942 Incontinence without sensory awareness: Secondary | ICD-10-CM | POA: Diagnosis not present

## 2016-10-26 DIAGNOSIS — Z86711 Personal history of pulmonary embolism: Secondary | ICD-10-CM | POA: Diagnosis not present

## 2016-10-26 DIAGNOSIS — Z7901 Long term (current) use of anticoagulants: Secondary | ICD-10-CM

## 2016-10-26 DIAGNOSIS — L97521 Non-pressure chronic ulcer of other part of left foot limited to breakdown of skin: Secondary | ICD-10-CM | POA: Diagnosis not present

## 2016-10-26 DIAGNOSIS — S90822A Blister (nonthermal), left foot, initial encounter: Secondary | ICD-10-CM

## 2016-10-26 DIAGNOSIS — R609 Edema, unspecified: Secondary | ICD-10-CM

## 2016-10-26 MED ORDER — OXYBUTYNIN CHLORIDE 5 MG PO TABS: ORAL_TABLET | ORAL | 5 refills | Status: DC

## 2016-10-26 NOTE — Progress Notes (Signed)
[place] Room Number: [phone]  Place of Service: Clinic (12)     Allergies  Allergen Reactions  . Adhesive [Tape] Rash    On on lower extremities     Chief Complaint  Patient presents with  . Medical Management of Chronic Issues    6 month medication management anemia, anxiety     HPI:  WEGENERS GRANULOMATOSIS - on MTX and prednisone. She believes this is helping control symotoms  Urinary incontinence without sensory awareness - want to try a medication to help inconyinence and frequency  Skin ulcer of second toe of left foot, limited to breakdown of skin (HCC) - improving  Anemia of chronic disease - stable  Abnormality of gait - using motorized chair. She is trying to exercise and take a few steps.  Edema, unspecified type - stable at 2-3+ bipedal.  Long term current use of anticoagulant therapy - for hx of POulmonary embolus  Personal history of PE (pulmonary embolism) - anticoagulated  Overweight - wants dietary consult  Blister of heel, left, initial encounter - healing  Acute bacterial conjunctivitis of left eye - improved. Still a little flushed.    Medications: Patient's Medications  New Prescriptions   No medications on file  Previous Medications   ACETAMINOPHEN (TYLENOL) 650 MG CR TABLET    Take 650 mg by mouth. Take 2 every four hours for fever greater than 100.3. Take 2 tablets once a day; take 2 tablets twice daily as needed for pain   B COMPLEX VITAMINS CAPSULE    Take 1 capsule by mouth daily.   CALCIUM-VITAMIN D (OSCAL WITH D) 500-200 MG-UNIT PER TABLET    Take 1 tablet by mouth daily.    CHOLECALCIFEROL (VITAMIN D) 1000 UNITS TABLET    One daily for vitamin D supplement   DICLOFENAC SODIUM (VOLTAREN) 1 % GEL    Apply 2 g topically 3 (three) times daily. Apply to left shoulder.   FLUTICASONE-SALMETEROL (ADVAIR DISKUS) 500-50 MCG/DOSE AEPB    INHALE 1 PUFF EVERY 12 HOURS.   FOLIC ACID (FOLVITE) 1 MG TABLET    Take 1 mg by  mouth.   GABAPENTIN (NEURONTIN) 300 MG CAPSULE    Take 300 mg by mouth 3 (three) times daily.    GUAIFENESIN (MUCINEX) 600 MG 12 HR TABLET    Take 600 mg by mouth 2 (two) times daily.   LATANOPROST (XALATAN) 0.005 % OPHTHALMIC SOLUTION    1 drop. One drop both eyes at bedtime   LEVALBUTEROL (XOPENEX) 0.63 MG/3ML NEBULIZER SOLUTION    Take 0.63 mg by nebulization every 6 (six) hours as needed for wheezing or shortness of breath.   METHOTREXATE (RHEUMATREX) 2.5 MG TABLET    Take 10 mg by mouth. Take 4 tablets  (10 mg total) once a week for 30 days. Hold Folic Acid the day methotrexate is given   MULTIPLE VITAMINS-MINERALS (THERAVIM-M) TABS    Take 1 tablet by mouth daily.   MUPIROCIN CREAM (BACTROBAN) 2 %    Apply 1 application topically daily. Apply to the right lateral malleolus  wound area on with daily dressing change.   NAPHAZOLINE-PHENIRAMINE (NAPHCON-A) 0.025-0.3 % OPHTHALMIC SOLUTION    1 drop. One drop in left eye three times a day as needed for redness   OMEPRAZOLE (PRILOSEC) 20 MG CAPSULE    Take 20 mg by mouth daily.   PHENOL (SORE THROAT SPRAY) 1.4 % LIQD    Use as directed 1 spray in the mouth  or throat as needed for throat irritation / pain.   SACCHAROMYCES BOULARDII (FLORASTOR) 250 MG CAPSULE    Take 250 mg by mouth. Take one capsule once a day   SULFAMETHOXAZOLE-TRIMETHOPRIM (BACTRIM DS,SEPTRA DS) 800-160 MG PER TABLET    Take 1 tablet by mouth every Monday, Wednesday, and Friday. HOLD WHILE ON AUGMENTIN, THEN RESUME AS PREVIOUS   TIMOLOL (BETIMOL) 0.5 % OPHTHALMIC SOLUTION    Place 1 drop into the left eye daily.   WARFARIN (COUMADIN) 4 MG TABLET    Take 4 mg by mouth daily at 6 PM. Take once daily on Sun, Mon, Tues and Wed.   WITCH HAZEL (PREPARATION H EX)    Apply topically. Apply ointment for hemorrhoids four times a day as needed for discomfort  Modified Medications   No medications on file  Discontinued Medications   No medications on file     Review of Systems    Constitutional: Positive for fever. Negative for chills and diaphoresis.       Frail and generally weak.  HENT: Positive for hearing loss (Deaf on left, hearing aid on right ), nosebleeds and sore throat. Negative for congestion, ear discharge, ear pain and tinnitus.        Left cochlear implant in place and has a magnet that she applies to the left parietal scalp associated with the cochlear implant.  Eyes: Positive for discharge (OS) and redness (OS). Negative for pain.  Respiratory: Positive for cough and shortness of breath. Negative for wheezing (Intermittent).        History Wegener's granulomatosis. History of chronic sinusitis related to her Wegener's. Chronic cough and yellowish phlegm production   Cardiovascular: Positive for leg swelling. Negative for chest pain and palpitations.       Trace R+L foot/ankle, R>L  Gastrointestinal: Negative for abdominal pain, constipation, diarrhea and nausea.       History of GERD.  Endocrine: Negative for polydipsia.  Genitourinary: Positive for frequency. Negative for dysuria, flank pain, hematuria and urgency.       Frequency induced by furosemide. Episodes of incontinence of urine.  Musculoskeletal: Positive for arthralgias and gait problem. Negative for back pain, myalgias and neck pain.       Generalized weakness Left upper extremity pain, limited ROM of the left shoulder  Skin: Positive for wound. Negative for rash.       Painfu uUlcer of the left 2nd toe medially.  Neurological: Positive for weakness. Negative for dizziness, tremors, seizures and headaches.       Neuropathic leg/foot pain. Generalized weakness BLE  Hematological: Does not bruise/bleed easily.       Chronic anemia.  Psychiatric/Behavioral: Negative for hallucinations and suicidal ideas. The patient is nervous/anxious.     Vitals:   10/26/16 1516  BP: 124/76  Pulse: 81  Temp: 98.4 F (36.9 C)  TempSrc: Oral  SpO2: 96%  Weight: 146 lb (66.2 kg)  Height: '5\' 1"'$   (1.549 m)   Wt Readings from Last 3 Encounters:  10/26/16 146 lb (66.2 kg)  09/28/16 141 lb (64 kg)  09/27/16 141 lb (64 kg)    Body mass index is 27.59 kg/m.  Physical Exam  Constitutional: She is oriented to person, place, and time. She appears well-developed. No distress.  Thin and frail. Weight stable  since 3/16  HENT:  Head: Normocephalic and atraumatic.  Nose: Nose normal.  Mouth/Throat: No oropharyngeal exudate.  Bilateral hearing loss. Hearing aid - right ear Lleft cochlear implant 2017 Erythema of the  pharynx.  Eyes: EOM are normal. Pupils are equal, round, and reactive to light. Right eye exhibits no discharge. Left eye exhibits no discharge. No scleral icterus.  Left conjunctiva quite red and bloodshot. There is a yellow sticky exudate as well.  Neck: Normal range of motion. Neck supple. No JVD present. No tracheal deviation present. No thyromegaly present.  Cardiovascular: Normal rate, regular rhythm, normal heart sounds and intact distal pulses.   No murmur heard. Pulmonary/Chest: Effort normal. No stridor. No respiratory distress. She has wheezes. She has rales. She exhibits no tenderness.  Abdominal: Soft. Bowel sounds are normal. She exhibits no distension and no mass. There is no tenderness. There is no rebound and no guarding.  Musculoskeletal: Normal range of motion. She exhibits edema (trace) and tenderness.  R+L foot/ankle, R>L, trace to 1+ Left upper extremity pain. L shoulder limited ROM  Lymphadenopathy:    She has no cervical adenopathy.  Neurological: She is alert and oriented to person, place, and time. She has normal reflexes. No cranial nerve deficit. She exhibits normal muscle tone. Coordination normal.  The patient has history of neuropathy of bilateral lower extremities which resulted in generalized weakness and pain of legs, she is only able to ambulates a few steps with walker in her room and depend on w/c to go further. In order to maintain her  quality of life and mobility, she needs wheelchair to travel to dining room for meals and attend activities on the [place] where she resides. Due to her left upper extremity pain related to an injury occurred 4-5 years, she is unable to propel manual wheelchair or steer a scooter in her living space to maintain her mobility. Mrs Cranmore manages her own affairs and finances with minimal assistance. She has worked with therapy in the past and has been deemed to be safe to operate a power chair.   Skin: Skin is warm and dry. No rash noted. She is not diaphoretic. No erythema. No pallor.  Ulcer of the left 2nd toe and 3rd toe tips.Left 2nd toe overlaps the great toe.  Psychiatric: She has a normal mood and affect. Her behavior is normal. Judgment and thought content normal.     Labs reviewed: Lab Summary Latest Ref Rng & Units 09/28/2016 06/29/2016 06/01/2016 02/08/2016  Hemoglobin 12.0 - 16.0 g/dL 11.3(A) 11.3(A) 12.9 11.7(A)  Hematocrit 36 - 46 % 35(A) 35(A) 39.6 37  White count 10:3/mL 4.1 7.3 5.3 4.0  Platelet count 150 - 399 K/L 238 228 276 256  Sodium 137 - 147 mmol/L 139 136(A) (None) 141  Potassium 3.4 - 5.3 mmol/L 4.2 4.1 (None) 4.1  Calcium - (None) (None) (None) (None)  Phosphorus - (None) (None) (None) (None)  Creatinine 0.5 - 1.1 mg/dL 0.8 0.9 (None) 0.8  AST 13 - 35 U/L 22 (None) (None) 23  Alk Phos 25 - 125 U/L 115 (None) (None) 127(A)  Bilirubin - (None) (None) (None) (None)  Glucose mg/dL 92 131 (None) 87  Cholesterol - (None) (None) (None) (None)  HDL cholesterol - (None) (None) (None) (None)  Triglycerides - (None) (None) (None) (None)  LDL Direct - (None) (None) (None) (None)  LDL Calc - (None) (None) (None) (None)  Total protein - (None) (None) (None) (None)  Albumin - (None) (None) (None) (None)  Some recent data might be hidden   Lab Results  Component Value Date   TSH 1.56 05/13/2015   Lab Results  Component Value Date   BUN 13 09/28/2016   BUN  13  06/29/2016   BUN 10 02/08/2016   Lab Results  Component Value Date   CREATININE 0.8 09/28/2016   CREATININE 0.9 06/29/2016   CREATININE 0.8 02/08/2016   Lab Results  Component Value Date   HGBA1C 5.8 (H) 07/28/2014       Assessment/Plan  1. WEGENERS GRANULOMATOSIS The current medical regimen is effective;  continue present plan and medications.  2. Urinary incontinence without sensory awareness - oxybutynin (DITROPAN) 5 MG tablet; One twice daily to help bladder control  Dispense: 60 tablet; Refill: 5  3. Skin ulcer of second toe of left foot, limited to breakdown of skin ([place]) healing  4. Anemia of chronic disease stable  5. Abnormality of gait Continue use of motorized chair  6. Edema, unspecified type stable  7. Long term current use of anticoagulant therapy Continue warfarin  8. Personal history of PE (pulmonary embolism) Cont anticoagulation  9. Overweight -Dietician to consult  10. Blister of heel, left, initial encounter healing  11. Acute bacterial conjunctivitis of left eye improved

## 2016-10-27 LAB — POCT INR: INR: 2.8 — AB (ref <=1.1)

## 2016-11-02 ENCOUNTER — Other Ambulatory Visit: Payer: Self-pay | Admitting: *Deleted

## 2016-11-02 ENCOUNTER — Encounter: Payer: Self-pay | Admitting: Nurse Practitioner

## 2016-11-02 ENCOUNTER — Non-Acute Institutional Stay: Payer: Medicare Other | Admitting: Nurse Practitioner

## 2016-11-02 DIAGNOSIS — D638 Anemia in other chronic diseases classified elsewhere: Secondary | ICD-10-CM | POA: Diagnosis not present

## 2016-11-02 DIAGNOSIS — L97521 Non-pressure chronic ulcer of other part of left foot limited to breakdown of skin: Secondary | ICD-10-CM

## 2016-11-02 DIAGNOSIS — G600 Hereditary motor and sensory neuropathy: Secondary | ICD-10-CM

## 2016-11-02 DIAGNOSIS — K219 Gastro-esophageal reflux disease without esophagitis: Secondary | ICD-10-CM

## 2016-11-02 DIAGNOSIS — N3942 Incontinence without sensory awareness: Secondary | ICD-10-CM

## 2016-11-02 DIAGNOSIS — M1710 Unilateral primary osteoarthritis, unspecified knee: Secondary | ICD-10-CM | POA: Diagnosis not present

## 2016-11-02 DIAGNOSIS — K5900 Constipation, unspecified: Secondary | ICD-10-CM

## 2016-11-02 DIAGNOSIS — I739 Peripheral vascular disease, unspecified: Secondary | ICD-10-CM

## 2016-11-02 DIAGNOSIS — R609 Edema, unspecified: Secondary | ICD-10-CM

## 2016-11-02 DIAGNOSIS — I2782 Chronic pulmonary embolism: Secondary | ICD-10-CM | POA: Diagnosis not present

## 2016-11-02 DIAGNOSIS — M3130 Wegener's granulomatosis without renal involvement: Secondary | ICD-10-CM

## 2016-11-02 DIAGNOSIS — F411 Generalized anxiety disorder: Secondary | ICD-10-CM

## 2016-11-02 NOTE — Progress Notes (Signed)
Location:  [facility] Room Number: 329 Place of Service:  ALF [phone]) Provider:  Miyuki Rzasa, Manxie  NP  Jeanmarie Hubert, MD  Patient Care Team: Estill Dooms, MD as PCP - General (Internal Medicine) Unice Bailey, MD as Consulting Physician (Rheumatology) Rozetta Nunnery, MD as Consulting Physician (Otolaryngology) Marcial Pacas, MD as Consulting Physician (Neurology) Michel Bickers, MD as Consulting Physician (Infectious Diseases) Volanda Napoleon, MD as Consulting Physician (Oncology) Juanito Doom, MD as Consulting Physician (Pulmonary Disease) Tanda Rockers, MD as Consulting Physician (Pulmonary Disease) Estill Dooms, MD as Consulting Physician (Geriatric Medicine) Coralie Keens, MD as Consulting Physician (General Surgery) Peyton Rossner Otho Darner, NP as Nurse Practitioner (Nurse Practitioner)  Extended Emergency Contact Information Primary Emergency Contact: Haygood,Sandra Address: [address] [place] [address] Johnnette Litter of [place] Phone: [phone] Relation: Daughter Secondary Emergency Contact: Deleeuw,[name] Address: [address] [place]          [place], [place] [address] [place] Phone: [phone] Mobile Phone: [phone] Relation: Son  Code Status:  Full Code Goals of care: Advanced Directive information Advanced Directives 11/02/2016  Does Patient Have a Medical Advance Directive? Yes  Type of Paramedic of [place];Living will  Does patient want to make changes to medical advance directive? No - Patient declined  Copy of [place] in Chart? Yes  Would patient like information on creating a medical advance directive? -     Chief Complaint  Patient presents with  . Acute Visit    Evaluate need for boot    HPI:  Pt is a 83 y.o. female seen today for an acute visit for   Hx of Anemia, Iron infusion 06/15/16 per Hematology. Peripheral neuropathy, w/c  for mobility, takes Gabapentin 300mg  tid. GERD symptomatic free on Omeprazole 20mg  daily. Takes Coumadin for PE. Wegeners Granulomatosis, stable, on Septra DS MWF indefinitely, Advair, Xopenex prn, Mucinex bid. Started Methotrexate a month ago     Past Medical History:  Diagnosis Date  . Acute respiratory failure with hypoxia ([place])   . Anemia   . Aspergillosis ([place])   . Bladder incontinence   . Blindness of one eye   . Cancer ([place])    melanoma   ([place])  . Chronic sinusitis   . Clotting disorder ([place])   . Difficult intubation    Difficult airway with intubation for ARF 07/22/14 (due to anterior larynx, also had mucous plug)  . Fatigue 12/09/2015  . Glaucoma   . Hearing loss in left ear    hearing aid both ears  . History of pulmonary embolism 1997  . Interstitial emphysema ([place])   . Left shoulder pain 12/09/2015  . Multiple allergies   . Neuropathy   . OA (osteoarthritis)   . Pneumonia 12/15  . Shortness of breath dyspnea   . Stroke [facility])    ??? blind left eye  . Wegener's granulomatosis ([place])    Past Surgical History:  Procedure Laterality Date  . bronchosocpy     pt. states she has had over 10 in last 20 yrs.  Marland Kitchen CATARACT EXTRACTION    . COCHLEAR IMPLANT Left 10/27/2015   Procedure: LEFT COCHLEAR IMPLANT;  Surgeon: Leta Baptist, MD;  Location: [facility];  Service: ENT;  Laterality: Left;  . INGUINAL HERNIA REPAIR Left 02/17/2014   Procedure: LEFT INGUINAL HERNIA REPAIR WITH MESH;  Surgeon: Harl Bowie, MD;  Location: [facility];  Service: General;  Laterality: Left;  . INSERTION OF MESH N/A 02/17/2014   Procedure: INSERTION OF MESH;  Surgeon: Harl Bowie, MD;  Location: [facility];  Service: General;  Laterality: N/A;  . KNEE ARTHROSCOPY  2006   rt   . MELANOMA EXCISION     left leg  . PUBOVAGINAL SLING    . TEAR DUCT PROBING    . TUBAL LIGATION    . TYMPANOMASTOIDECTOMY Left 04/28/2015  . TYMPANOMASTOIDECTOMY Left 04/28/2015    Procedure: LEFT TYMPANOMASTOIDECTOMY;  Surgeon: Leta Baptist, MD;  Location: [facility];  Service: ENT;  Laterality: Left;  Marland Kitchen VENA CAVA FILTER PLACEMENT  1997   [place] filter    Allergies  Allergen Reactions  . Adhesive [Tape] Rash    On on lower extremities     Allergies as of 11/02/2016      Reactions   Adhesive [tape] Rash   On on lower extremities       Medication List       Accurate as of 11/02/16  5:29 PM. Always use your most recent med list.          acetaminophen 650 MG CR tablet Commonly known as:  TYLENOL Take 650 mg by mouth. Take 2 every four hours for fever greater than 100.3. Take 2 tablets once a day; take 2 tablets twice daily as needed for pain   b complex vitamins capsule Take 1 capsule by mouth daily.   calcium-vitamin D 500-200 MG-UNIT tablet Commonly known as:  OSCAL WITH D Take 1 tablet by mouth daily.   cholecalciferol 1000 units tablet Commonly known as:  VITAMIN D One daily for vitamin D supplement   diclofenac sodium 1 % Gel Commonly known as:  VOLTAREN Apply 2 g topically 3 (three) times daily. Apply to left shoulder.   Fluticasone-Salmeterol 500-50 MCG/DOSE Aepb Commonly known as:  ADVAIR DISKUS INHALE 1 PUFF EVERY 12 HOURS.   folic acid 1 MG tablet Commonly known as:  FOLVITE Take 1 mg by mouth.   gabapentin 300 MG capsule Commonly known as:  NEURONTIN Take 300 mg by mouth 3 (three) times daily.   guaiFENesin 600 MG 12 hr tablet Commonly known as:  MUCINEX Take 600 mg by mouth 2 (two) times daily.   latanoprost 0.005 % ophthalmic solution Commonly known as:  XALATAN 1 drop. One drop both eyes at bedtime   levalbuterol 0.63 MG/3ML nebulizer solution Commonly known as:  XOPENEX Take 0.63 mg by nebulization every 6 (six) hours as needed for wheezing or shortness of breath.   methotrexate 2.5 MG tablet Commonly known as:  RHEUMATREX Take 10 mg by mouth. Take 4 tablets  (10 mg total) once a week for 30 days. Hold Folic Acid the day  methotrexate is given   mupirocin cream 2 % Commonly known as:  BACTROBAN Apply 1 application topically daily. Apply to the right lateral malleolus  wound area on with daily dressing change.   NAPHCON-A 0.025-0.3 % ophthalmic solution Generic drug:  naphazoline-pheniramine 1 drop. One drop in left eye three times a day as needed for redness   omeprazole 20 MG capsule Commonly known as:  PRILOSEC Take 20 mg by mouth daily.   oxybutynin 5 MG tablet Commonly known as:  DITROPAN One twice daily to help bladder control   PREPARATION H EX Apply topically. Apply ointment for hemorrhoids four times a day as needed for discomfort   saccharomyces boulardii 250 MG capsule  Commonly known as:  FLORASTOR Take 250 mg by mouth. Take one capsule once a day   SORE THROAT SPRAY 1.4 % Liqd Generic drug:  phenol Use as directed 1 spray in the mouth or throat as needed for throat irritation / pain.   sulfamethoxazole-trimethoprim 800-160 MG tablet Commonly known as:  BACTRIM DS,SEPTRA DS Take 1 tablet by mouth every Monday, Wednesday, and Friday. HOLD WHILE ON AUGMENTIN, THEN RESUME AS PREVIOUS   THERAVIM-M Tabs Take 1 tablet by mouth daily.   timolol 0.5 % ophthalmic solution Commonly known as:  BETIMOL Place 1 drop into the left eye daily.   warfarin 4 MG tablet Commonly known as:  COUMADIN Take 4 mg by mouth daily at 6 PM. Take once daily on Sun, Mon, Tues and Wed.       Review of Systems  Constitutional: Positive for fatigue. Negative for chills, diaphoresis and fever.       Frail and generally weak.  HENT: Positive for hearing loss (Deaf on left, hearing aid on right ). Negative for congestion, ear discharge, ear pain, nosebleeds, sore throat and tinnitus.        Left cochlear implant in place and has a magnet that she applies to the left parietal scalp associated with the cochlear implant.  Eyes: Positive for discharge (OS). Negative for pain and redness (OS).  Respiratory:  Positive for cough and shortness of breath. Negative for wheezing (Intermittent).        History Wegener's granulomatosis. History of chronic sinusitis related to her Wegener's. Chronic cough and yellowish phlegm production   Cardiovascular: Positive for leg swelling. Negative for chest pain and palpitations.       Trace R+L foot/ankle, R>L  Gastrointestinal: Negative for abdominal pain, constipation, diarrhea and nausea.       History of GERD.  Endocrine: Negative for polydipsia.  Genitourinary: Positive for frequency. Negative for dysuria, flank pain, hematuria and urgency.       Frequency induced by furosemide. Episodes of incontinence of urine.  Musculoskeletal: Positive for arthralgias and gait problem. Negative for back pain, myalgias and neck pain.       Generalized weakness Left upper extremity pain, limited ROM of the left shoulder  Skin: Positive for wound. Negative for rash.       the lateral right malleolus wound, scabbed over, the left heel scabbed x2 from previous blood filled blisters  Neurological: Positive for weakness. Negative for dizziness, tremors, seizures and headaches.       Neuropathic leg/foot pain. Generalized weakness BLE  Hematological: Does not bruise/bleed easily.       Chronic anemia.  Psychiatric/Behavioral: Negative for hallucinations and suicidal ideas. The patient is nervous/anxious.        Husband is in SNF    Immunization History  Administered Date(s) Administered  . Influenza Split 03/04/2012, 03/11/2014, 04/01/2015  . Influenza,inj,Quad PF,36+ Mos 03/03/2013  . Influenza-Unspecified 04/01/2015, 04/19/2016  . PPD Test 05/25/2015  . Pneumococcal Conjugate-13 07/01/2015  . Pneumococcal Polysaccharide-23 09/02/2015  . Zoster 09/30/1994   Pertinent  Health Maintenance Due  Topic Date Due  . INFLUENZA VACCINE  01/31/2017  . DEXA SCAN  Completed  . PNA vac Low Risk Adult  Completed   Fall Risk  12/09/2015 09/23/2015 09/09/2015 02/03/2015 12/04/2014    Falls in the past year? No Yes Yes Yes Yes  Number falls in past yr: - 2 or more 2 or more 2 or more 2 or more  Injury with Fall? - No No - No  Functional Status Survey:    Vitals:   11/02/16 1428  BP: 126/78  Pulse: 72  Resp: 18  Temp: 99.2 F (37.3 C)  Weight: 144 lb 8 oz (65.5 kg)  Height: 5\' 1"  (1.549 m)   Body mass index is 27.3 kg/m. Physical Exam  Constitutional: She is oriented to person, place, and time. She appears well-developed. No distress.  Thin and frail. Weight stable  since 3/16  HENT:  Head: Normocephalic and atraumatic.  Nose: Nose normal.  Mouth/Throat: No oropharyngeal exudate.  Bilateral hearing loss. Hearing aid - right ear Pending left cochlear implant. Erythema of the pharynx.  Eyes: EOM are normal. Pupils are equal, round, and reactive to light. Right eye exhibits no discharge. Left eye exhibits no discharge. No scleral icterus.  Left conjunctiva quite red and bloodshot. There is a whitish exudate as well.  Neck: Normal range of motion. Neck supple. No JVD present. No tracheal deviation present. No thyromegaly present.  Cardiovascular: Normal rate, regular rhythm, normal heart sounds and intact distal pulses.   No murmur heard. Absent pedal pulses, the lateral right malleolus wound, not infected, tender, very mild warmth and redness.   Pulmonary/Chest: Effort normal. No stridor. No respiratory distress. She has no wheezes. She has rales. She exhibits no tenderness.  Abdominal: Soft. Bowel sounds are normal. She exhibits no distension and no mass. There is no tenderness. There is no rebound and no guarding.  Musculoskeletal: Normal range of motion. She exhibits edema (trace) and tenderness.  R+L foot/ankle, R>L, trace to 1+ Left upper extremity pain. L shoulder limited ROM  Lymphadenopathy:    She has no cervical adenopathy.  Neurological: She is alert and oriented to person, place, and time. She has normal reflexes. No cranial nerve deficit. She  exhibits normal muscle tone. Coordination normal.  The patient has history of neuropathy of bilateral lower extremities which resulted in generalized weakness and pain of legs, she is only able to ambulates a few steps with walker in her room and depend on w/c to go further. In order to maintain her quality of life and mobility, she needs wheelchair to travel to dinning room for meals and attend activities on the [place] where she resides. Due to her left upper extremity pain related to an injury occurred 4-5 years, she is unable to propel manual wheelchair or steer a scooter in her living space to maintain her mobility. Mrs Correia manages her own affairs and finances with minimal assistance. She has worked with therapy in the past and has been deemed to be safe to operate a power chair.    Skin: Skin is warm and dry. No rash noted. She is not diaphoretic. No erythema. No pallor.  Left 2nd toe overlaps the great toe. R lateral malleolus scab over from previous contusion. Left heel 2x scabbed over areas from previous blisters.    Psychiatric: She has a normal mood and affect. Her behavior is normal. Judgment and thought content normal.    Labs reviewed:  Recent Labs  02/08/16 06/29/16 09/28/16  NA 141 136* 139  K 4.1 4.1 4.2  BUN 10 13 13   CREATININE 0.8 0.9 0.8    Recent Labs  11/26/15 02/08/16 09/28/16  AST 33 23 22  ALT 20 17 15   ALKPHOS 171* 127* 115    Recent Labs  12/02/15 1327  06/01/16 1247 06/29/16 09/28/16  WBC 5.3  < > 5.3 7.3 4.1  NEUTROABS 3.3  --  3.4  --   --  HGB 12.1  < > 12.9 11.3* 11.3*  HCT 37.0  < > 39.6 35* 35*  MCV 88  --  91  --   --   PLT 303  < > 276 228 238  < > = values in this interval not displayed. Lab Results  Component Value Date   TSH 1.56 05/13/2015   Lab Results  Component Value Date   HGBA1C 5.8 (H) 07/28/2014   No results found for: CHOL, HDL, LDLCALC, LDLDIRECT, TRIG, CHOLHDL  Significant Diagnostic Results in last 30  days:  Ct Chest Wo Contrast  Result Date: 10/16/2016 CLINICAL DATA:  83 year old female with history of Wegner's granulomatosis. Lung nodules for the past 25 years. Rattling in the chest for several years on physical examination. Additional history of melanoma of the left leg removed 5-6 years ago. Former smoker (quit 1986). Current cough. EXAM: CT CHEST WITHOUT CONTRAST TECHNIQUE: Multidetector CT imaging of the chest was performed following the standard protocol without IV contrast. COMPARISON:  Chest CT 07/08/2015. FINDINGS: Cardiovascular: Heart size is normal. There is no significant pericardial fluid, thickening or pericardial calcification. There is aortic atherosclerosis, as well as atherosclerosis of the great vessels of the mediastinum and the coronary arteries, including calcified atherosclerotic plaque in the left anterior descending coronary artery. Calcifications of the mitral annulus. Mediastinum/Nodes: No pathologically enlarged mediastinal or hilar lymph nodes. Please note that accurate exclusion of hilar adenopathy is limited on noncontrast CT scans. Esophagus is unremarkable in appearance. No axillary lymphadenopathy. Lungs/Pleura: Several areas of architectural distortion are again noted in the lungs bilaterally, most compatible with old areas of post infectious or inflammatory scarring. Some of these are somewhat nodular in appearance, including an 8 mm area of nodularity in the superior segment of the left lower lobe (image 42 of series 4) and a 2.3 x 0.9 cm area in the inferior aspect of the left lower lobe (image 85 of series 4). No other new suspicious appearing pulmonary nodules or masses are otherwise noted. No acute consolidative airspace disease. No pleural effusions. Large subpleural bulla in the medial aspect of the apex of the left hemithorax is unchanged. Upper Abdomen: Aortic atherosclerosis. Musculoskeletal: There are no aggressive appearing lytic or blastic lesions noted in the  visualized portions of the skeleton. IMPRESSION: 1. Previously noted multifocal areas of nodular scarring are very similar to prior examinations, compatible with the reported clinical history of Wegner's granulomatosis. No new suspicious pulmonary nodules are noted. 2. Large bulla in the medial aspect of the apex of the left upper lobe is unchanged. 3. Aortic atherosclerosis, in addition to left anterior descending coronary artery disease. 4. There are calcifications of the mitral annulus. Echocardiographic correlation for evaluation of potential valvular dysfunction may be warranted if clinically indicated. Electronically Signed   By: Vinnie Langton M.D.   On: 10/16/2016 16:29    Assessment/Plan WEGENERS GRANULOMATOSIS 10/09/16 Dr. Ang Rheumatology, CT chest w/o CM, started Methotrexate, prednisone. Chronic cough, stable, on Septra DS MWF indefinitely, Advair, Xopenex prn, Mucinex bid. Improved left eye and cough. But the patient c/o loose stools x2 11/02/16. Will continue to monitor the patient.   Pulmonary embolism ([place]) Managed on chronic warfarin    PVD (peripheral vascular disease) (HCC) Left heel blood filled blisters healed, a small scab presents, the lateral right malleolus wound healing, scabbed over, left toes wound, healing, scabbed over, no s/s of infection, continue Boots at while in bed for pressure reduction at bony prominent.   Constipation Stable, continue Miralax.  GERD (gastroesophageal reflux disease) Stable, continue Omeprazole 20mg  daily   Peripheral neuropathy (HCC) Lower extremities weakness, motorized w/c for mobility, continue Neurontin    Osteoarthrosis Motorized scooter for mobility.   Ulcer of left second toe ([place]) Healing nicely  Anemia of chronic disease 09/28/16 Hgb 11.3 09/28/16  Edema Off diuretics. Only trace edema in ankles-no longer needing TED.   Urine incontinence Continue Oxybutynin.   Generalized anxiety disorder Improved, off  meds      Family/ staff Communication:  AL  Labs/tests ordered:  none

## 2016-11-02 NOTE — Assessment & Plan Note (Addendum)
10/09/16 Dr. Veneta Penton Rheumatology, CT chest w/o CM, started Methotrexate, prednisone. Chronic cough, stable, on Septra DS MWF indefinitely, Advair, Xopenex prn, Mucinex bid. Improved left eye and cough. But the patient c/o loose stools x2 11/02/16. Will continue to monitor the patient.

## 2016-11-02 NOTE — Assessment & Plan Note (Addendum)
Left heel blood filled blisters healed, a small scab presents, the lateral right malleolus wound healing, scabbed over, left toes wound, healing, scabbed over, no s/s of infection, continue Boots at while in bed for pressure reduction at bony prominent.

## 2016-11-02 NOTE — Assessment & Plan Note (Signed)
Stable, continue Miralax.

## 2016-11-02 NOTE — Assessment & Plan Note (Signed)
Lower extremities weakness, motorized w/c for mobility, continue Neurontin

## 2016-11-02 NOTE — Assessment & Plan Note (Signed)
Managed on chronic warfarin

## 2016-11-02 NOTE — Assessment & Plan Note (Signed)
Healing nicely.  

## 2016-11-02 NOTE — Assessment & Plan Note (Signed)
Off diuretics. Only trace edema in ankles-no longer needing TED.

## 2016-11-02 NOTE — Assessment & Plan Note (Signed)
Motorized scooter for mobility.

## 2016-11-02 NOTE — Assessment & Plan Note (Signed)
Stable, continue Omeprazole 20mg daily.  

## 2016-11-02 NOTE — Assessment & Plan Note (Signed)
09/28/16 Hgb 11.3 09/28/16

## 2016-11-02 NOTE — Assessment & Plan Note (Signed)
Improved, off meds

## 2016-11-02 NOTE — Assessment & Plan Note (Signed)
Continue Oxybutynin 

## 2016-11-23 LAB — PROTIME-INR: Protime: 25.2 seconds — AB (ref 10.0–13.8)

## 2016-11-23 LAB — POCT INR: INR: 2.5 — AB (ref <=1.1)

## 2016-11-30 ENCOUNTER — Other Ambulatory Visit: Payer: Self-pay | Admitting: *Deleted

## 2016-12-05 LAB — BASIC METABOLIC PANEL
BUN: 17 (ref 4–21)
Creatinine: 0.9 (ref 0.5–1.1)
Glucose: 80
Potassium: 4.1 (ref 3.4–5.3)
SODIUM: 141 (ref 137–147)

## 2016-12-05 LAB — HEPATIC FUNCTION PANEL
ALK PHOS: 54 (ref 25–125)
ALT: 33 (ref 7–35)
AST: 19 (ref 13–35)
Bilirubin, Total: 0.4

## 2016-12-05 LAB — CBC AND DIFFERENTIAL
HCT: 35 — AB (ref 36–46)
Hemoglobin: 12 (ref 12.0–16.0)
Platelets: 211 (ref 150–399)
WBC: 6.3

## 2016-12-26 ENCOUNTER — Non-Acute Institutional Stay: Payer: Medicare Other | Admitting: Nurse Practitioner

## 2016-12-26 ENCOUNTER — Encounter: Payer: Self-pay | Admitting: Nurse Practitioner

## 2016-12-26 DIAGNOSIS — N3942 Incontinence without sensory awareness: Secondary | ICD-10-CM

## 2016-12-26 DIAGNOSIS — R609 Edema, unspecified: Secondary | ICD-10-CM | POA: Diagnosis not present

## 2016-12-26 DIAGNOSIS — D638 Anemia in other chronic diseases classified elsewhere: Secondary | ICD-10-CM

## 2016-12-26 DIAGNOSIS — K5900 Constipation, unspecified: Secondary | ICD-10-CM | POA: Diagnosis not present

## 2016-12-26 DIAGNOSIS — Z7901 Long term (current) use of anticoagulants: Secondary | ICD-10-CM | POA: Diagnosis not present

## 2016-12-26 DIAGNOSIS — M3130 Wegener's granulomatosis without renal involvement: Secondary | ICD-10-CM | POA: Diagnosis not present

## 2016-12-26 DIAGNOSIS — K219 Gastro-esophageal reflux disease without esophagitis: Secondary | ICD-10-CM | POA: Diagnosis not present

## 2016-12-26 DIAGNOSIS — G600 Hereditary motor and sensory neuropathy: Secondary | ICD-10-CM

## 2016-12-26 NOTE — Assessment & Plan Note (Signed)
Takes Ditropan 5mg , seems better

## 2016-12-26 NOTE — Assessment & Plan Note (Signed)
10/09/16 Dr. Veneta Penton Rheumatology, CT chest w/o CM. Now off  Methotrexate, tapering off prednisone.resolved loose stools.  Chronic cough, stable, on Septra DS MWF indefinitely, Advair, Xopenex prn, Mucinex bid. Newly started on Cytoxan

## 2016-12-26 NOTE — Assessment & Plan Note (Signed)
Stable, continue Miralax.

## 2016-12-26 NOTE — Assessment & Plan Note (Addendum)
12/07/16 Hgb 12, hx of iron infusion, f/u Hematology

## 2016-12-26 NOTE — Assessment & Plan Note (Signed)
INR 1.6 12/21/16, INR 1.8 12/26/16, Coumadin 2.5mg  qd since 12/26/16, trending up to therapeutic level.  Continue Coumadin 2.5mg  daily, repeat PT/INR one week.

## 2016-12-26 NOTE — Assessment & Plan Note (Signed)
Stable, continue Omeprazole 20mg daily.  

## 2016-12-26 NOTE — Assessment & Plan Note (Signed)
Off diuretics. Only trace edema in ankles-no longer needing TED.

## 2016-12-26 NOTE — Assessment & Plan Note (Addendum)
Lower extremities weakness, motorized w/c for mobility, continue Neurontin 300mg  tid.

## 2016-12-26 NOTE — Progress Notes (Signed)
Location:  [place] Room Number: [place] of Service:  ALF [phone]) Provider:  Rosie Torrez, Manxie  NP  Blanchie Serve, MD  [name], MD as PCP - General (Internal Medicine) Unice Bailey, MD as Consulting Physician (Rheumatology) Rozetta Nunnery, MD as Consulting Physician (Otolaryngology) Marcial Pacas, MD as Consulting Physician (Neurology) Michel Bickers, MD as Consulting Physician (Infectious Diseases) Marin Olp Rudell Cobb, MD as Consulting Physician (Oncology) Juanito Doom, MD as Consulting Physician (Pulmonary Disease) Tanda Rockers, MD as Consulting Physician (Pulmonary Disease) Estill Dooms, MD as Consulting Physician (Geriatric Medicine) Coralie Keens, MD as Consulting Physician (General Surgery) Ijeoma Loor X, NP as Nurse Practitioner (Nurse Practitioner)  Extended Emergency Contact Information Primary Emergency Contact: Haygood,Sandra Address: [address] [place] [address] Johnnette Litter of [place] Phone: [phone] Relation: Daughter Secondary Emergency Contact: Misch,[name] Address: [address] [place]          [place], [place] [address] [place] of [place] Phone: [phone] Mobile Phone: [phone] Relation: Son  Code Status:  Full Code Goals of care: Advanced Directive information Advanced Directives 12/26/2016  Does Patient Have a Medical Advance Directive? Yes  Type of Paramedic of [place];Living will  Does patient want to make changes to medical advance directive? No - Patient declined  Copy of [place] in Chart? Yes  Would patient like information on creating a medical advance directive? -     Chief Complaint  Patient presents with  . Acute Visit    Elevated INR    HPI:  Pt is a 83 y.o. female seen today for an acute visit for anticoagulation management, INR 1.6 12/21/16, INR 1.8 12/26/16, Coumadin 2.5mg  qd  since 12/26/16, trending up to therapeutic level.   Hx of Anemia, last Hgb 12s 6/8 Iron infusion 06/15/16 per Hematology. Peripheral neuropathy, w/c for mobility, takes Gabapentin 300mg  tid. GERD symptomatic free on Omeprazole 20mg  daily. Takes Coumadin for PE. Wegeners Granulomatosis, stable, on Septra DS MWF indefinitely, Advair, Xopenex prn, Mucinex bid. Presently on Prednisone taper dose and Cytoxan per ophthalmology  Past Medical History:  Diagnosis Date  . Acute respiratory failure with hypoxia ([place])   . Anemia   . Aspergillosis ([place])   . Bladder incontinence   . Blindness of one eye   . Cancer ([place])    melanoma   (chemo)  . Chronic sinusitis   . Clotting disorder ([place])   . Difficult intubation    Difficult airway with intubation for ARF 07/22/14 (due to anterior larynx, also had mucous plug)  . Fatigue 12/09/2015  . Glaucoma   . Hearing loss in left ear    hearing aid both ears  . History of pulmonary embolism 1997  . Interstitial emphysema ([place])   . Left shoulder pain 12/09/2015  . Multiple allergies   . Neuropathy   . OA (osteoarthritis)   . Pneumonia 12/15  . Shortness of breath dyspnea   . Stroke [facility])    ??? blind left eye  . Wegener's granulomatosis ([place])    Past Surgical History:  Procedure Laterality Date  . bronchosocpy     pt. states she has had over 10 in last 20 yrs.  Marland Kitchen CATARACT EXTRACTION    . COCHLEAR IMPLANT Left 10/27/2015   Procedure: LEFT COCHLEAR IMPLANT;  Surgeon: Leta Baptist, MD;  Location: [facility];  Service: ENT;  Laterality: Left;  .  INGUINAL HERNIA REPAIR Left 02/17/2014   Procedure: LEFT INGUINAL HERNIA REPAIR WITH MESH;  Surgeon: Harl Bowie, MD;  Location: [facility];  Service: General;  Laterality: Left;  . INSERTION OF MESH N/A 02/17/2014   Procedure: INSERTION OF MESH;  Surgeon: Harl Bowie, MD;  Location: [facility];  Service: General;  Laterality: N/A;  . KNEE ARTHROSCOPY  2006   rt   . MELANOMA EXCISION      left leg  . PUBOVAGINAL SLING    . TEAR DUCT PROBING    . TUBAL LIGATION    . TYMPANOMASTOIDECTOMY Left 04/28/2015  . TYMPANOMASTOIDECTOMY Left 04/28/2015   Procedure: LEFT TYMPANOMASTOIDECTOMY;  Surgeon: Leta Baptist, MD;  Location: [facility];  Service: ENT;  Laterality: Left;  Marland Kitchen VENA CAVA FILTER PLACEMENT  1997   [place] filter    Allergies  Allergen Reactions  . Adhesive [Tape] Rash    On on lower extremities     Outpatient Encounter Prescriptions as of 12/26/2016  Medication Sig  . acetaminophen (TYLENOL) 650 MG CR tablet Take 650 mg by mouth. Take 2 every four hours for fever greater than 100.3. Take 2 tablets once a day; take 2 tablets twice daily as needed for pain  . b complex vitamins capsule Take 1 capsule by mouth daily.  . calcium-vitamin D (OSCAL WITH D) 500-200 MG-UNIT per tablet Take 1 tablet by mouth daily.   . cholecalciferol (VITAMIN D) 1000 UNITS tablet One daily for vitamin D supplement  . cyclophosphamide (CYTOXAN) 50 MG tablet Take 50 mg by mouth daily. Give on an empty stomach 1 hour before or 2 hours after meals.  . diclofenac sodium (VOLTAREN) 1 % GEL Apply 2 g topically 3 (three) times daily. Apply to left shoulder.  . Fluticasone-Salmeterol (ADVAIR DISKUS) 500-50 MCG/DOSE AEPB INHALE 1 PUFF EVERY 12 HOURS.  . folic acid (FOLVITE) 1 MG tablet Take 1 mg by mouth.  . gabapentin (NEURONTIN) 300 MG capsule Take 300 mg by mouth 3 (three) times daily.   Marland Kitchen guaiFENesin (MUCINEX) 600 MG 12 hr tablet Take 600 mg by mouth 2 (two) times daily.  . hydrocortisone cream 1 % Apply 1 application topically 4 (four) times daily.  Marland Kitchen latanoprost (XALATAN) 0.005 % ophthalmic solution 1 drop. One drop both eyes at bedtime  . levalbuterol (XOPENEX) 0.63 MG/3ML nebulizer solution Take 0.63 mg by nebulization every 6 (six) hours as needed for wheezing or shortness of breath.  . Multiple Vitamins-Minerals (THERAVIM-M) TABS Take 1 tablet by mouth daily.  . naphazoline-pheniramine  (NAPHCON-A) 0.025-0.3 % ophthalmic solution 1 drop. One drop in left eye three times a day as needed for redness  . omeprazole (PRILOSEC) 20 MG capsule Take 20 mg by mouth daily.  Marland Kitchen oxybutynin (DITROPAN) 5 MG tablet One twice daily to help bladder control  . phenol (SORE THROAT SPRAY) 1.4 % LIQD Use as directed 1 spray in the mouth or throat as needed for throat irritation / pain.  . predniSONE (DELTASONE) 10 MG tablet Take 40 mg by mouth daily with breakfast.  . saccharomyces boulardii (FLORASTOR) 250 MG capsule Take 250 mg by mouth. Take one capsule once a day  . sulfamethoxazole-trimethoprim (BACTRIM DS,SEPTRA DS) 800-160 MG per tablet Take 1 tablet by mouth every Monday, Wednesday, and Friday. HOLD WHILE ON AUGMENTIN, THEN RESUME AS PREVIOUS  . timolol (BETIMOL) 0.5 % ophthalmic solution Place 1 drop into the left eye daily.  Marland Kitchen warfarin (COUMADIN) 2.5 MG tablet Take 2.5 mg by  mouth daily.  Addison Lank Hazel (PREPARATION H EX) Apply topically. Apply ointment for hemorrhoids four times a day as needed for discomfort  . [DISCONTINUED] mupirocin cream (BACTROBAN) 2 % Apply 1 application topically daily. Apply to the right lateral malleolus  wound area on with daily dressing change.  . [DISCONTINUED] warfarin (COUMADIN) 4 MG tablet Take 4 mg by mouth daily at 6 PM. Take once daily on Sun, Mon, Tues and Wed.   No facility-administered encounter medications on file as of 12/26/2016.     Review of Systems  Constitutional: Negative for chills, diaphoresis, fatigue and fever.       Frail and generally weak.  HENT: Positive for hearing loss (Deaf on left, hearing aid on right ). Negative for congestion, ear discharge, ear pain, nosebleeds, sore throat and tinnitus.        Left cochlear implant in place and has a magnet that she applies to the left parietal scalp associated with the cochlear implant.  Eyes: Negative for pain, discharge (OS) and redness (OS).  Respiratory: Positive for cough and shortness of  breath. Negative for wheezing (Intermittent).        History Wegener's granulomatosis. History of chronic sinusitis related to her Wegener's. Chronic cough and yellowish phlegm production   Cardiovascular: Positive for leg swelling. Negative for chest pain and palpitations.       Trace R+L foot/ankle, R>L  Gastrointestinal: Negative for abdominal pain, constipation, diarrhea and nausea.       History of GERD.  Endocrine: Negative for polydipsia.  Genitourinary: Positive for frequency. Negative for dysuria, flank pain, hematuria and urgency.       Frequency induced by furosemide. Episodes of incontinence of urine.  Musculoskeletal: Positive for arthralgias and gait problem. Negative for back pain, myalgias and neck pain.       Generalized weakness Left upper extremity pain, limited ROM of the left shoulder  Skin: Negative for rash and wound.  Neurological: Positive for weakness. Negative for dizziness, tremors, seizures and headaches.       Neuropathic leg/foot pain. Generalized weakness BLE  Hematological: Does not bruise/bleed easily.       Chronic anemia.  Psychiatric/Behavioral: Negative for hallucinations and suicidal ideas. The patient is not nervous/anxious.     Immunization History  Administered Date(s) Administered  . Influenza Split 03/04/2012, 03/11/2014, 04/01/2015  . Influenza,inj,Quad PF,36+ Mos 03/03/2013  . Influenza-Unspecified 04/01/2015, 04/19/2016  . PPD Test 05/25/2015  . Pneumococcal Conjugate-13 07/01/2015  . Pneumococcal Polysaccharide-23 09/02/2015  . Zoster 09/30/1994   Pertinent  Health Maintenance Due  Topic Date Due  . INFLUENZA VACCINE  01/31/2017  . DEXA SCAN  Completed  . PNA vac Low Risk Adult  Completed   Fall Risk  12/09/2015 09/23/2015 09/09/2015 02/03/2015 12/04/2014  Falls in the past year? No Yes Yes Yes Yes  Number falls in past yr: - 2 or more 2 or more 2 or more 2 or more  Injury with Fall? - No No - No   Functional Status Survey:    Vitals:    12/26/16 1249  BP: 132/72  Pulse: 60  Resp: 20  Temp: 99.3 F (37.4 C)  Weight: 146 lb (66.2 kg)  Height: 5\' 1"  (1.549 m)   Body mass index is 27.59 kg/m. Physical Exam  Constitutional: She is oriented to person, place, and time. She appears well-developed. No distress.  HENT:  Head: Normocephalic and atraumatic.  Nose: Nose normal.  Mouth/Throat: No oropharyngeal exudate.  Bilateral hearing loss. Hearing aid -  right ear Left cochlear implant  Eyes: EOM are normal. Pupils are equal, round, and reactive to light. Right eye exhibits no discharge. Left eye exhibits no discharge. No scleral icterus.  Left eye blind  Neck: Normal range of motion. Neck supple. No JVD present. No tracheal deviation present. No thyromegaly present.  Cardiovascular: Normal rate, regular rhythm, normal heart sounds and intact distal pulses.   No murmur heard. Absent pedal pulses  Pulmonary/Chest: Effort normal. No stridor. No respiratory distress. She has no wheezes. She has rales. She exhibits no tenderness.  Abdominal: Soft. Bowel sounds are normal. She exhibits no distension and no mass. There is no tenderness. There is no rebound and no guarding.  Musculoskeletal: Normal range of motion. She exhibits edema (trace) and tenderness.  R+L foot/ankle, R>L, trace to 1+ Left upper extremity pain. L shoulder limited ROM  Lymphadenopathy:    She has no cervical adenopathy.  Neurological: She is alert and oriented to person, place, and time. She has normal reflexes. No cranial nerve deficit. She exhibits normal muscle tone. Coordination normal.  The patient has history of neuropathy of bilateral lower extremities which resulted in generalized weakness and pain of legs, she is only able to ambulates a few steps with walker in her room and depend on w/c to go further. In order to maintain her quality of life and mobility, she needs wheelchair to travel to dinning room for meals and attend activities on the [place] where she resides. Due to her left upper extremity pain related to an injury occurred 4-5 years, she is unable to propel manual wheelchair or steer a scooter in her living space to maintain her mobility. Mrs Corne manages her own affairs and finances with minimal assistance. She has worked with therapy in the past and has been deemed to be safe to operate a power chair.    Skin: Skin is warm and dry. No rash noted. She is not diaphoretic. No erythema. No pallor.  Left 2nd toe overlaps the great toe.    Psychiatric: She has a normal mood and affect. Her behavior is normal. Judgment and thought content normal.    Labs reviewed:  Recent Labs  02/08/16 06/29/16 09/28/16  NA 141 136* 139  K 4.1 4.1 4.2  BUN 10 13 13   CREATININE 0.8 0.9 0.8    Recent Labs  02/08/16 09/28/16  AST 23 22  ALT 17 15  ALKPHOS 127* 115    Recent Labs  06/01/16 1247 06/29/16 09/28/16  WBC 5.3 7.3 4.1  NEUTROABS 3.4  --   --   HGB 12.9 11.3* 11.3*  HCT 39.6 35* 35*  MCV 91  --   --   PLT 276 228 238   Lab Results  Component Value Date   TSH 1.56 05/13/2015   Lab Results  Component Value Date   HGBA1C 5.8 (H) 07/28/2014   No results found for: CHOL, HDL, LDLCALC, LDLDIRECT, TRIG, CHOLHDL  Significant Diagnostic Results in last 30 days:  No results found.  Assessment/Plan Long term current use of anticoagulant therapy  INR 1.6 12/21/16, INR 1.8 12/26/16, Coumadin 2.5mg  qd since 12/26/16, trending up to therapeutic level.  Continue Coumadin 2.5mg  daily, repeat PT/INR one week.   WEGENERS GRANULOMATOSIS 10/09/16 Dr. Veneta Penton [facility], CT chest w/o CM. Now off  Methotrexate, tapering off prednisone.resolved loose stools.  Chronic cough, stable, on Septra DS MWF indefinitely, Advair, Xopenex prn, Mucinex bid. Newly started on Cytoxan  Constipation Stable, continue Miralax.  GERD (gastroesophageal reflux disease) Stable, continue Omeprazole 20mg  daily  Peripheral neuropathy Lower  extremities weakness, motorized w/c for mobility, continue Neurontin 300mg  tid.     Anemia of chronic disease 12/07/16 Hgb 12, hx of iron infusion, f/u Hematology  Edema Off diuretics. Only trace edema in ankles-no longer needing TED.    Urine incontinence Takes Ditropan 5mg , seems better     Family/ staff Communication: AL  Labs/tests ordered:  PT/INR one week

## 2017-01-01 ENCOUNTER — Encounter: Payer: Self-pay | Admitting: Nurse Practitioner

## 2017-01-01 ENCOUNTER — Non-Acute Institutional Stay: Payer: Medicare Other | Admitting: Nurse Practitioner

## 2017-01-01 DIAGNOSIS — T300 Burn of unspecified body region, unspecified degree: Secondary | ICD-10-CM | POA: Insufficient documentation

## 2017-01-01 DIAGNOSIS — I739 Peripheral vascular disease, unspecified: Secondary | ICD-10-CM | POA: Diagnosis not present

## 2017-01-01 DIAGNOSIS — Z7901 Long term (current) use of anticoagulants: Secondary | ICD-10-CM

## 2017-01-01 DIAGNOSIS — G600 Hereditary motor and sensory neuropathy: Secondary | ICD-10-CM | POA: Diagnosis not present

## 2017-01-01 DIAGNOSIS — X12XXXA Contact with other hot fluids, initial encounter: Secondary | ICD-10-CM

## 2017-01-01 NOTE — Assessment & Plan Note (Signed)
Lower extremities weakness, motorized w/c for mobility, continue Neurontin 300mg  tid.
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
## 2017-01-01 NOTE — Assessment & Plan Note (Addendum)
For PE, goal of INR 2-3, adjust PT/INR, subtherapeutic 12/21/16 INR 1.6, coumadin was increased, pending PT/INR.
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
## 2017-01-01 NOTE — Assessment & Plan Note (Signed)
Left heel blood filled blisters healed, a small scab presents, the lateral right malleolus wound healing, scabbed over.

## 2017-01-02 LAB — POCT INR: INR: 2.1 — AB (ref 0.9–1.1)

## 2017-01-04 ENCOUNTER — Encounter: Payer: Self-pay | Admitting: Internal Medicine

## 2017-01-04 LAB — CBC AND DIFFERENTIAL
HEMATOCRIT: 36 (ref 36–46)
HEMOGLOBIN: 12 (ref 12.0–16.0)
Platelets: 201 (ref 150–399)
WBC: 6.1

## 2017-01-04 LAB — HEPATIC FUNCTION PANEL
ALT: 26 (ref 7–35)
AST: 15 (ref 13–35)
Alkaline Phosphatase: 48 (ref 25–125)
BILIRUBIN, TOTAL: 0.6

## 2017-01-04 LAB — BASIC METABOLIC PANEL
BUN: 13 (ref 4–21)
Creatinine: 0.9 (ref <=1.1)
Glucose: 87
POTASSIUM: 4 (ref 3.4–5.3)
Sodium: 139 (ref 137–147)

## 2017-01-04 NOTE — Progress Notes (Signed)
  Review of Systems  Constitutional: Negative for appetite change and fever.  HENT: Negative for congestion and rhinorrhea.   Respiratory: Negative for cough and shortness of breath.   Cardiovascular: Negative for chest pain and palpitations.  Gastrointestinal: Negative for abdominal pain.  Genitourinary: Negative for dysuria.  Psychiatric/Behavioral: Negative for behavioral problems.   Physical Exam    This encounter was created in error - please disregard.

## 2017-01-08 ENCOUNTER — Other Ambulatory Visit: Payer: Self-pay | Admitting: *Deleted

## 2017-01-09 ENCOUNTER — Encounter: Payer: Self-pay | Admitting: Nurse Practitioner

## 2017-01-09 ENCOUNTER — Non-Acute Institutional Stay: Payer: Medicare Other | Admitting: Nurse Practitioner

## 2017-01-09 DIAGNOSIS — X12XXXA Contact with other hot fluids, initial encounter: Secondary | ICD-10-CM

## 2017-01-09 DIAGNOSIS — T300 Burn of unspecified body region, unspecified degree: Secondary | ICD-10-CM | POA: Diagnosis not present

## 2017-01-09 DIAGNOSIS — I2782 Chronic pulmonary embolism: Secondary | ICD-10-CM

## 2017-01-09 DIAGNOSIS — D638 Anemia in other chronic diseases classified elsewhere: Secondary | ICD-10-CM | POA: Diagnosis not present
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
## 2017-01-09 NOTE — Progress Notes (Signed)
Location:  [facility] Room Number: 093 Place of Service:  ALF [phone]) Provider:  Mast, Manxie  NP  Blanchie Serve, MD  [name], MD as PCP - General (Internal Medicine) Unice Bailey, MD as Consulting Physician (Rheumatology) Rozetta Nunnery, MD as Consulting Physician (Otolaryngology) Marcial Pacas, MD as Consulting Physician (Neurology) Michel Bickers, MD as Consulting Physician (Infectious Diseases) Marin Olp Rudell Cobb, MD as Consulting Physician (Oncology) Juanito Doom, MD as Consulting Physician (Pulmonary Disease) Tanda Rockers, MD as Consulting Physician (Pulmonary Disease) Estill Dooms, MD as Consulting Physician (Geriatric Medicine) Coralie Keens, MD as Consulting Physician (General Surgery) Mast, Man X, NP as Nurse Practitioner (Nurse Practitioner)  Extended Emergency Contact Information Primary Emergency Contact: Haygood,Sandra Address: [address] [place] [address] Johnnette Litter of [place] Phone: [phone] Relation: Daughter Secondary Emergency Contact: Ryland,[name] Address: [address] [place]          [place], [place] [address] [place] Phone: [phone] Mobile Phone: [phone] Relation: Son  Code Status:  Full Code Goals of care: Advanced Directive information Advanced Directives 01/09/2017  Does Patient Have a Medical Advance Directive? Yes  Type of Paramedic of [place];Living will  Does patient want to make changes to medical advance directive? No - Patient declined  Copy of [place] in Chart? Yes  Would patient like information on creating a medical advance directive? -     Chief Complaint  Patient presents with  . Acute Visit    medication adjustment    HPI:  Pt is a 83 y.o. female seen today for an acute visit for    Past Medical History:  Diagnosis Date  . Acute respiratory  failure with hypoxia ([place])   . Anemia   . Aspergillosis ([place])   . Bladder incontinence   . Blindness of one eye   . Cancer ([place])    melanoma   (chemo)  . Chronic sinusitis   . Clotting disorder ([place])   . Difficult intubation    Difficult airway with intubation for ARF 07/22/14 (due to anterior larynx, also had mucous plug)  . Fatigue 12/09/2015  . Glaucoma   . Hearing loss in left ear    hearing aid both ears  . History of pulmonary embolism 1997  . Interstitial emphysema ([place])   . Left shoulder pain 12/09/2015  . Multiple allergies   . Neuropathy   . OA (osteoarthritis)   . Pneumonia 12/15  . Shortness of breath dyspnea   . Stroke [facility])    ??? blind left eye  . Wegener's granulomatosis ([place])    Past Surgical History:  Procedure Laterality Date  . bronchosocpy     pt. states she has had over 10 in last 20 yrs.  Marland Kitchen CATARACT EXTRACTION    . COCHLEAR IMPLANT Left 10/27/2015   Procedure: LEFT COCHLEAR IMPLANT;  Surgeon: Leta Baptist, MD;  Location: [facility];  Service: ENT;  Laterality: Left;  . INGUINAL HERNIA REPAIR Left 02/17/2014   Procedure: LEFT INGUINAL HERNIA REPAIR WITH MESH;  Surgeon: Harl Bowie, MD;  Location: [place];  Service: General;  Laterality: Left;  . INSERTION OF MESH N/A 02/17/2014   Procedure: INSERTION OF MESH;  Surgeon: Harl Bowie, MD;  Location: [facility];  Service: General;  Laterality: N/A;  . KNEE ARTHROSCOPY  2006   rt   .  MELANOMA EXCISION     left leg  . PUBOVAGINAL SLING    . TEAR DUCT PROBING    . TUBAL LIGATION    . TYMPANOMASTOIDECTOMY Left 04/28/2015  . TYMPANOMASTOIDECTOMY Left 04/28/2015   Procedure: LEFT TYMPANOMASTOIDECTOMY;  Surgeon: Leta Baptist, MD;  Location: [facility];  Service: ENT;  Laterality: Left;  Marland Kitchen VENA CAVA FILTER PLACEMENT  1997   [place] filter    Allergies  Allergen Reactions  . Adhesive [Tape] Rash    On on lower extremities     Outpatient Encounter Prescriptions as of 01/09/2017    Medication Sig  . acetaminophen (TYLENOL) 650 MG CR tablet Take 650 mg by mouth at bedtime. Can also take 650 mg by mouth 2 times daily as needed  . calcium-vitamin D (OSCAL WITH D) 500-200 MG-UNIT per tablet Take 1 tablet by mouth daily.   . cholecalciferol (VITAMIN D) 1000 UNITS tablet One daily for vitamin D supplement  . cyclophosphamide (CYTOXAN) 50 MG tablet Take 50 mg by mouth daily. Give on an empty stomach 1 hour before or 2 hours after meals.  . diclofenac sodium (VOLTAREN) 1 % GEL Apply 2 g topically 3 (three) times daily. Apply to left shoulder.  . Fluticasone-Salmeterol (ADVAIR DISKUS) 500-50 MCG/DOSE AEPB INHALE 1 PUFF EVERY 12 HOURS.  . folic acid (FOLVITE) 1 MG tablet Take 1 mg by mouth.  . gabapentin (NEURONTIN) 300 MG capsule Take 300 mg by mouth 3 (three) times daily.   Marland Kitchen guaiFENesin (MUCINEX) 600 MG 12 hr tablet Take 600 mg by mouth 2 (two) times daily.  . hydrocortisone cream 1 % Apply 1 application topically 4 (four) times daily as needed.   . latanoprost (XALATAN) 0.005 % ophthalmic solution 1 drop. One drop both eyes at bedtime  . levalbuterol (XOPENEX) 0.63 MG/3ML nebulizer solution Take 0.63 mg by nebulization every 6 (six) hours as needed for wheezing or shortness of breath.  Marland Kitchen LORazepam (ATIVAN) 0.5 MG tablet Take 0.5 mg by mouth at bedtime.  . Multiple Vitamins-Minerals (THERAVIM-M) TABS Take 1 tablet by mouth daily.  . naphazoline-pheniramine (NAPHCON-A) 0.025-0.3 % ophthalmic solution 1 drop. One drop in left eye three times a day as needed for redness  . omeprazole (PRILOSEC) 20 MG capsule Take 20 mg by mouth daily.  Marland Kitchen oxybutynin (DITROPAN) 5 MG tablet One twice daily to help bladder control  . phenol (SORE THROAT SPRAY) 1.4 % LIQD Use as directed 1 spray in the mouth or throat as needed for throat irritation / pain.  Derrill Memo ON 01/17/2017] predniSONE (DELTASONE) 10 MG tablet Take 10 mg by mouth daily with breakfast. Start on 01/18/16. Stop date 02/06/17  .  saccharomyces boulardii (FLORASTOR) 250 MG capsule Take 250 mg by mouth daily.   . silver sulfADIAZINE (SILVADENE) 1 % cream Apply 1 application topically 2 (two) times daily.  Marland Kitchen sulfamethoxazole-trimethoprim (BACTRIM DS,SEPTRA DS) 800-160 MG per tablet Take 1 tablet by mouth every Monday, Wednesday, and Friday. HOLD WHILE ON AUGMENTIN, THEN RESUME AS PREVIOUS  . timolol (BETIMOL) 0.5 % ophthalmic solution Place 1 drop into the left eye daily.  Marland Kitchen UNABLE TO FIND Med Name: [facility]'s magic mouthwash. Swish and spit 5 mL QID PRN  . [DISCONTINUED] predniSONE (DELTASONE) 20 MG tablet Take 20 mg by mouth daily with breakfast. Stop date 01/16/17  . [DISCONTINUED] predniSONE (DELTASONE) 5 MG tablet Take 5 mg by mouth daily with breakfast.  . [DISCONTINUED] warfarin (COUMADIN) 2.5 MG tablet Take 2.5 mg by mouth daily.   No facility-administered  encounter medications on file as of 01/09/2017.     Review of Systems  Immunization History  Administered Date(s) Administered  . Influenza Split 03/04/2012, 03/11/2014, 04/01/2015  . Influenza,inj,Quad PF,36+ Mos 03/03/2013  . Influenza-Unspecified 04/01/2015, 04/19/2016  . PPD Test 05/25/2015  . Pneumococcal Conjugate-13 07/01/2015  . Pneumococcal Polysaccharide-23 09/02/2015  . Zoster 09/30/1994   Pertinent  Health Maintenance Due  Topic Date Due  . INFLUENZA VACCINE  01/31/2017  . DEXA SCAN  Completed  . PNA vac Low Risk Adult  Completed   Fall Risk  12/09/2015 09/23/2015 09/09/2015 02/03/2015 12/04/2014  Falls in the past year? No Yes Yes Yes Yes  Number falls in past yr: - 2 or more 2 or more 2 or more 2 or more  Injury with Fall? - No No - No   Functional Status Survey:    Vitals:   01/09/17 1334  BP: 130/68  Pulse: 62  Resp: 20  Temp: 99.6 F (37.6 C)  Weight: 150 lb (68 kg)  Height: 5\' 1"  (1.549 m)   Body mass index is 28.34 kg/m. Physical Exam  Labs reviewed:  Recent Labs  09/28/16 12/05/16 01/04/17  NA 139 141 139  K 4.2 4.1 4.0    BUN 13 17 13   CREATININE 0.8 0.9 0.9    Recent Labs  09/28/16 12/05/16 01/04/17  AST 22 19 15   ALT 15 33 26  ALKPHOS 115 54 48    Recent Labs  06/01/16 1247  09/28/16 12/05/16 01/04/17  WBC 5.3  < > 4.1 6.3 6.1  NEUTROABS 3.4  --   --   --   --   HGB 12.9  < > 11.3* 12.0 12.0  HCT 39.6  < > 35* 35* 36  MCV 91  --   --   --   --   PLT 276  < > 238 211 201  < > = values in this interval not displayed. Lab Results  Component Value Date   TSH 1.56 05/13/2015   Lab Results  Component Value Date   HGBA1C 5.8 (H) 07/28/2014   No results found for: CHOL, HDL, LDLCALC, LDLDIRECT, TRIG, CHOLHDL  Significant Diagnostic Results in last 30 days:  No results found.  Assessment/Plan There are no diagnoses linked to this encounter.   Family/ staff Communication:   Labs/tests ordered:

## 2017-01-09 NOTE — Assessment & Plan Note (Signed)
@   the right inner thigh, healing nicely on Silvadene cream.

## 2017-01-09 NOTE — Assessment & Plan Note (Signed)
anticoagulation therapy for Hx of PE,  subtherapeutic INR 1.8 today, currently taking Coumadin 2.5mg  daily, last INR 2.1 01/02/17, no new po medication added since then. 01/09/17 increase Coumadin 5/2.5mg  on Tuesday, continue Coumadin 2.5mg  M, W, Thr, Fri, Sat, Sun, repeat PT/INR one week.

## 2017-01-10 ENCOUNTER — Other Ambulatory Visit: Payer: Self-pay | Admitting: *Deleted

## 2017-01-18 LAB — HEPATIC FUNCTION PANEL
ALK PHOS: 59 (ref 25–125)
ALT: 41 — AB (ref 7–35)
AST: 24 (ref 13–35)
BILIRUBIN, TOTAL: 0.5

## 2017-01-18 LAB — CBC AND DIFFERENTIAL
HEMATOCRIT: 37 (ref 36–46)
Hemoglobin: 12.3 (ref 12.0–16.0)
PLATELETS: 284 (ref 150–399)
WBC: 6.9

## 2017-01-18 LAB — BASIC METABOLIC PANEL
BUN: 11 (ref 4–21)
Creatinine: 0.9 (ref <=1.1)
GLUCOSE: 83
Potassium: 4.3 (ref 3.4–5.3)
Sodium: 137 (ref 137–147)

## 2017-01-19 ENCOUNTER — Non-Acute Institutional Stay: Payer: Medicare Other | Admitting: Nurse Practitioner

## 2017-01-19 ENCOUNTER — Encounter: Payer: Self-pay | Admitting: Nurse Practitioner

## 2017-01-19 DIAGNOSIS — E441 Mild protein-calorie malnutrition: Secondary | ICD-10-CM

## 2017-01-19 DIAGNOSIS — R609 Edema, unspecified: Secondary | ICD-10-CM | POA: Diagnosis not present

## 2017-01-19 DIAGNOSIS — X12XXXA Contact with other hot fluids, initial encounter: Secondary | ICD-10-CM

## 2017-01-19 DIAGNOSIS — M3130 Wegener's granulomatosis without renal involvement: Secondary | ICD-10-CM | POA: Diagnosis not present

## 2017-01-19 DIAGNOSIS — T300 Burn of unspecified body region, unspecified degree: Secondary | ICD-10-CM | POA: Diagnosis not present

## 2017-01-19 DIAGNOSIS — E46 Unspecified protein-calorie malnutrition: Secondary | ICD-10-CM | POA: Insufficient documentation

## 2017-01-19 DIAGNOSIS — D638 Anemia in other chronic diseases classified elsewhere: Secondary | ICD-10-CM
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
## 2017-01-19 NOTE — Assessment & Plan Note (Signed)
01/18/17 wbc 6.9, Hgb 12.3, plt 284, Na 137, K 4.3, Bun 11, creat 0.85, TP 5.4, albumin 3.3

## 2017-01-19 NOTE — Assessment & Plan Note (Signed)
The patient recent CMP showed total protein 5.4, albumin 3.3, mild protein calorie malnutrition, this may hinder the healing process, encourage well balance diet may help.
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
## 2017-01-19 NOTE — Assessment & Plan Note (Signed)
10/09/16 Dr. Veneta Penton Rheumatology, CT chest w/o CM. Now off  Methotrexate. Presently on Prednisone 10mg  qd,  Chronic cough, stable, on Septra DS MWF indefinitely, Advair, Xopenex prn, Mucinex bid. Newly started on Cytoxan

## 2017-01-23 LAB — POCT INR: INR: 1.2 — AB (ref 0.9–1.1)

## 2017-02-01 ENCOUNTER — Ambulatory Visit (INDEPENDENT_AMBULATORY_CARE_PROVIDER_SITE_OTHER)
Admission: RE | Admit: 2017-02-01 | Discharge: 2017-02-01 | Disposition: A | Discharge status: Home / Self Care | Payer: Medicare Other | Source: Ambulatory Visit | Attending: Pulmonary Disease | Admitting: Pulmonary Disease

## 2017-02-01 ENCOUNTER — Encounter: Payer: Self-pay | Admitting: Pulmonary Disease

## 2017-02-01 ENCOUNTER — Ambulatory Visit: Payer: Medicare Other | Admitting: Hematology & Oncology

## 2017-02-01 ENCOUNTER — Ambulatory Visit (INDEPENDENT_AMBULATORY_CARE_PROVIDER_SITE_OTHER): Payer: Medicare Other | Admitting: Pulmonary Disease

## 2017-02-01 ENCOUNTER — Other Ambulatory Visit (HOSPITAL_BASED_OUTPATIENT_CLINIC_OR_DEPARTMENT_OTHER): Payer: Medicare Other

## 2017-02-01 VITALS — BP 127/60 | HR 78 | Temp 97.9°F | Resp 19 | Wt 152.0 lb

## 2017-02-01 VITALS — BP 126/68 | HR 80 | Ht 61.0 in | Wt 153.4 lb

## 2017-02-01 DIAGNOSIS — R05 Cough: Secondary | ICD-10-CM

## 2017-02-01 DIAGNOSIS — D500 Iron deficiency anemia secondary to blood loss (chronic): Secondary | ICD-10-CM | POA: Insufficient documentation

## 2017-02-01 DIAGNOSIS — R0600 Dyspnea, unspecified: Secondary | ICD-10-CM

## 2017-02-01 DIAGNOSIS — M3130 Wegener's granulomatosis without renal involvement: Secondary | ICD-10-CM

## 2017-02-01 DIAGNOSIS — D509 Iron deficiency anemia, unspecified: Secondary | ICD-10-CM

## 2017-02-01 DIAGNOSIS — D638 Anemia in other chronic diseases classified elsewhere: Secondary | ICD-10-CM

## 2017-02-01 DIAGNOSIS — R059 Cough, unspecified: Secondary | ICD-10-CM

## 2017-02-01 LAB — CBC WITH DIFFERENTIAL (CANCER CENTER ONLY)
BASO#: 0 10*3/uL (ref 0.0–0.2)
BASO%: 0.5 % (ref 0.0–2.0)
EOS ABS: 0 10*3/uL (ref 0.0–0.5)
EOS%: 0.1 % (ref 0.0–7.0)
HCT: 36.9 % (ref 34.8–46.6)
HGB: 12.6 g/dL (ref 11.6–15.9)
LYMPH#: 0.3 10*3/uL — ABNORMAL LOW (ref 0.9–3.3)
LYMPH%: 3.5 % — AB (ref 14.0–48.0)
MCH: 34.1 pg — AB (ref 26.0–34.0)
MCHC: 34.1 g/dL (ref 32.0–36.0)
MCV: 100 fL (ref 81–101)
MONO#: 0.3 10*3/uL (ref 0.1–0.9)
MONO%: 3.6 % (ref 0.0–13.0)
NEUT%: 92.3 % — AB (ref 39.6–80.0)
NEUTROS ABS: 7.6 10*3/uL — AB (ref 1.5–6.5)
PLATELETS: 275 10*3/uL (ref 145–400)
RBC: 3.69 10*6/uL — ABNORMAL LOW (ref 3.70–5.32)
RDW: 20.3 % — ABNORMAL HIGH (ref 11.1–15.7)
WBC: 8.2 10*3/uL (ref 3.9–10.0)

## 2017-02-01 NOTE — Progress Notes (Signed)
Subjective:    Patient ID: [name], female    DOB: 28-Jul-1932, 83 y.o.   MRN: [id number]  Synopsis: Mrs. Fournier has asthma with normal pulmonary function testing. She also has a history of Wegener's granulomatosis and has been treated with high-dose immunosuppressants for many years. She has significant sinus disease related to this and she has lower airway obstruction which has been treated with balloon dilation in the past.  HPI Chief Complaint  Patient presents with  . Follow-up    pt states her breathing is fine but is feeling poorly d/t a new med she is taking.    She is back on cytoxan and she says she is not doing too well. She says that the physicians at [facility] are behind this. She is not really sure why they put her on it.   Review of her records from [facility] show that Dr. Veneta Penton at [facility] is treating scleritis with the cytoxan starting June 12/2016.  They are treating her with prednisone and bactrim.  Her breathing has been OK.  She has not had wheezing or bronchitis.  She is still taking advair.  She still produces mucus 2-3 times per day, she can't tell if its coming from her sinuses or lungs.   She continues to clean out her sinuses with saline solution.    Past Medical History:  Diagnosis Date  . Acute respiratory failure with hypoxia ([place])   . Anemia   . Aspergillosis ([place])   . Bladder incontinence   . Blindness of one eye   . Cancer ([place])    melanoma   (chemo)  . Chronic sinusitis   . Clotting disorder ([place])   . Difficult intubation    Difficult airway with intubation for ARF 07/22/14 (due to anterior larynx, also had mucous plug)  . Fatigue 12/09/2015  . Glaucoma   . Hearing loss in left ear    hearing aid both ears  . History of pulmonary embolism 1997  . Interstitial emphysema ([place])   . Left shoulder pain 12/09/2015  . Multiple allergies   . Neuropathy   . OA (osteoarthritis)   . Pneumonia 12/15  . Shortness of breath dyspnea   . Stroke [facility])    ??? blind left eye  . Wegener's granulomatosis ([place])       Review of Systems  Constitutional: Positive for fatigue. Negative for fever.  HENT: Negative for postnasal drip, rhinorrhea and sinus pressure.   Respiratory: Positive for cough. Negative for shortness of breath and wheezing.   Cardiovascular: Negative for chest pain, palpitations and leg swelling.       Objective:   Physical Exam  Vitals:   02/01/17 0942  BP: 126/68  Pulse: 80  SpO2: 98%  Weight: 153 lb 6.4 oz (69.6 kg)  Height: 5\' 1"  (1.549 m)   RA  Gen: chronically ill appearing HENT: OP clear, TM's clear, neck supple PULM: Slight wheeze bilaterally, normal percussion CV: RRR, no mgr, trace edema GI: BS+, soft, nontender Derm: no cyanosis or rash Psyche: normal mood and affect   July 2018 OSH labs: Cr normal, ALT up slightly at 41   CBC    Component Value Date/Time   WBC 6.9 01/18/2017   WBC 4.4 10/27/2015 0711   RBC 4.34 06/01/2016 1247   RBC 4.07 10/27/2015 0711   HGB 12.3 01/18/2017   HGB 12.9 06/01/2016 1247   HCT 37 01/18/2017   HCT 39.6 06/01/2016 1247   PLT 284 01/18/2017  PLT 276 06/01/2016 1247   MCV 91 06/01/2016 1247   MCH 29.7 06/01/2016 1247   MCH 26.3 10/27/2015 0711   MCHC 32.6 06/01/2016 1247   MCHC 31.3 10/27/2015 0711   RDW 16.5 (H) 06/01/2016 1247   LYMPHSABS 1.0 06/01/2016 1247   MONOABS 0.3 07/24/2014 0420   EOSABS 0.2 06/01/2016 1247   BASOSABS 0.0 06/01/2016 1247         Assessment & Plan:  Dyspnea, unspecified type - Plan: DG Chest 2 View  Cough  WEGENERS GRANULOMATOSIS  Discussion: Herman asthma standpoint it sounds like Myangel has not had problems this year. However, she notes an increasing cough and says that she's producing mucus at least 2-3 times per day. This is a relatively new problem for her. She does not have any sinus symptoms to explain this which is consistently over the years her typical source of mucus production. Today on exam her lungs  sound clear but given the immunosuppression she is receiving and the new cough I think we need to get a chest x-ray to make her there is no evidence of some sort of occult infection.  Otherwise, I think it's reasonable for her to continue taking Advair she is doing.  Plan: For your Wegeners: Keep taking the Advair as you are doing Keep taking the Cytoxan and prednisone and Bactrim per the direction of your physicians in the rheumatology clinic  For the cough: We will check a chest x-ray today  For the asthma: Continue taking Advair Get a flu shot in the fall  We will see you back in 6 months or sooner if needed    Current Outpatient Prescriptions:  .  acetaminophen (TYLENOL) 650 MG CR tablet, Take 650 mg by mouth at bedtime. Can also take 650 mg by mouth 2 times daily as needed, Disp: , Rfl:  .  calcium-vitamin D (OSCAL WITH D) 500-200 MG-UNIT per tablet, Take 1 tablet by mouth daily. , Disp: , Rfl:  .  cholecalciferol (VITAMIN D) 1000 UNITS tablet, One daily for vitamin D supplement, Disp: 30 tablet, Rfl: 11 .  cyclophosphamide (CYTOXAN) 50 MG tablet, Take 50 mg by mouth daily. Give on an empty stomach 1 hour before or 2 hours after meals., Disp: , Rfl:  .  diclofenac sodium (VOLTAREN) 1 % GEL, Apply 2 g topically 3 (three) times daily. Apply to left shoulder., Disp: , Rfl:  .  Fluticasone-Salmeterol (ADVAIR DISKUS) 500-50 MCG/DOSE AEPB, INHALE 1 PUFF EVERY 12 HOURS., Disp: 180 each, Rfl: 1 .  folic acid (FOLVITE) 1 MG tablet, Take 1 mg by mouth., Disp: , Rfl:  .  gabapentin (NEURONTIN) 300 MG capsule, Take 300 mg by mouth 3 (three) times daily. , Disp: , Rfl:  .  guaiFENesin (MUCINEX) 600 MG 12 hr tablet, Take 600 mg by mouth 2 (two) times daily., Disp: , Rfl:  .  hydrocortisone cream 1 %, Apply 1 application topically 4 (four) times daily as needed. , Disp: , Rfl:  .  latanoprost (XALATAN) 0.005 % ophthalmic solution, 1 drop. One drop both eyes at bedtime, Disp: , Rfl:  .   levalbuterol (XOPENEX) 0.63 MG/3ML nebulizer solution, Take 0.63 mg by nebulization every 6 (six) hours as needed for wheezing or shortness of breath., Disp: , Rfl:  .  LORazepam (ATIVAN) 0.5 MG tablet, Take 0.5 mg by mouth at bedtime., Disp: , Rfl:  .  Multiple Vitamins-Minerals (THERAVIM-M) TABS, Take 1 tablet by mouth daily., Disp: , Rfl:  .  naphazoline-pheniramine (NAPHCON-A)  0.025-0.3 % ophthalmic solution, 1 drop. One drop in left eye three times a day as needed for redness, Disp: , Rfl:  .  omeprazole (PRILOSEC) 20 MG capsule, Take 20 mg by mouth daily., Disp: , Rfl:  .  oxybutynin (DITROPAN) 5 MG tablet, One twice daily to help bladder control, Disp: 60 tablet, Rfl: 5 .  phenol (SORE THROAT SPRAY) 1.4 % LIQD, Use as directed 1 spray in the mouth or throat as needed for throat irritation / pain., Disp: , Rfl:  .  predniSONE (DELTASONE) 10 MG tablet, Take 10 mg by mouth daily with breakfast. Start on 01/18/16. Stop date 02/06/17, Disp: , Rfl:  .  saccharomyces boulardii (FLORASTOR) 250 MG capsule, Take 250 mg by mouth daily. , Disp: , Rfl:  .  silver sulfADIAZINE (SILVADENE) 1 % cream, Apply 1 application topically 2 (two) times daily., Disp: , Rfl:  .  sulfamethoxazole-trimethoprim (BACTRIM DS,SEPTRA DS) 800-160 MG per tablet, Take 1 tablet by mouth every Monday, Wednesday, and Friday. HOLD WHILE ON AUGMENTIN, THEN RESUME AS PREVIOUS, Disp: , Rfl: 1 .  timolol (BETIMOL) 0.5 % ophthalmic solution, Place 1 drop into the left eye daily., Disp: , Rfl:  .  UNABLE TO FIND, Med Name: [facility]'s magic mouthwash. Swish and spit 5 mL QID PRN, Disp: , Rfl:

## 2017-02-01 NOTE — Progress Notes (Signed)
Hematology and Oncology Follow Up Visit  [name] [id number] 06-24-1933 83 y.o. 02/01/2017   Principle Diagnosis:  Recurrent iron deficiency anemia Wegener's granulomatosis  Current Therapy:   IV iron as indicated - last received in December 2017    Interim History:  [name] is here for a follow-up. She is doing pretty well. She is in assisted living. She sees be managing pretty well.  She is on Cytoxan. It seems like she is on low-dose daily Cytoxan for the Wegener's granulomatosis.  The great news is that her son-in-law and daughter are moving to [facility]. They currently live in [place]. He is a physician. He is moving back to [facility].  She's had no bleeding. She's not been in the hospital.   Her last iron studies that were done back in November showed a ferritin of 41 with iron saturation of 18%. She did receive a dose of IV iron.   She has had no fever. She's had no rashes. She has had no nausea or vomiting.   Currently, I would say that her performance status is ECOG 2.    Medications:  Allergies as of 02/01/2017      Reactions   Adhesive [tape] Rash   On on lower extremities       Medication List       Accurate as of 02/01/17  2:18 PM. Always use your most recent med list.          acetaminophen 650 MG CR tablet Commonly known as:  TYLENOL Take 650 mg by mouth at bedtime. Can also take 650 mg by mouth 2 times daily as needed   calcium-vitamin D 500-200 MG-UNIT tablet Commonly known as:  OSCAL WITH D Take 1 tablet by mouth daily.   cholecalciferol 1000 units tablet Commonly known as:  VITAMIN D One daily for vitamin D supplement   cyclophosphamide 50 MG tablet Commonly known as:  CYTOXAN Take 50 mg by mouth daily. Give on an empty stomach 1 hour before or 2 hours after meals.   diclofenac sodium 1 % Gel Commonly known as:  VOLTAREN Apply 2 g topically 3 (three) times daily. Apply to left shoulder.   Fluticasone-Salmeterol 500-50  MCG/DOSE Aepb Commonly known as:  ADVAIR DISKUS INHALE 1 PUFF EVERY 12 HOURS.   folic acid 1 MG tablet Commonly known as:  FOLVITE Take 1 mg by mouth.   gabapentin 300 MG capsule Commonly known as:  NEURONTIN Take 300 mg by mouth 3 (three) times daily.   guaiFENesin 600 MG 12 hr tablet Commonly known as:  MUCINEX Take 600 mg by mouth 2 (two) times daily.   hydrocortisone cream 1 % Apply 1 application topically 4 (four) times daily as needed.   latanoprost 0.005 % ophthalmic solution Commonly known as:  XALATAN 1 drop. One drop both eyes at bedtime   levalbuterol 0.63 MG/3ML nebulizer solution Commonly known as:  XOPENEX Take 0.63 mg by nebulization every 6 (six) hours as needed for wheezing or shortness of breath.   LORazepam 0.5 MG tablet Commonly known as:  ATIVAN Take 0.5 mg by mouth at bedtime.   NAPHCON-A 0.025-0.3 % ophthalmic solution Generic drug:  naphazoline-pheniramine 1 drop. One drop in left eye three times a day as needed for redness   omeprazole 20 MG capsule Commonly known as:  PRILOSEC Take 20 mg by mouth daily.   oxybutynin 5 MG tablet Commonly known as:  DITROPAN One twice daily to help bladder control   predniSONE  10 MG tablet Commonly known as:  DELTASONE Take 10 mg by mouth daily with breakfast. Start on 01/18/16. Stop date 02/06/17   saccharomyces boulardii 250 MG capsule Commonly known as:  FLORASTOR Take 250 mg by mouth daily.   silver sulfADIAZINE 1 % cream Commonly known as:  SILVADENE Apply 1 application topically 2 (two) times daily.   SORE THROAT SPRAY 1.4 % Liqd Generic drug:  phenol Use as directed 1 spray in the mouth or throat as needed for throat irritation / pain.   sulfamethoxazole-trimethoprim 800-160 MG tablet Commonly known as:  BACTRIM DS,SEPTRA DS Take 1 tablet by mouth every Monday, Wednesday, and Friday. HOLD WHILE ON AUGMENTIN, THEN RESUME AS PREVIOUS   THERAVIM-M Tabs Take 1 tablet by mouth daily.   timolol  0.5 % ophthalmic solution Commonly known as:  BETIMOL Place 1 drop into the left eye daily.   UNABLE TO FIND Med Name: Duke's magic mouthwash. Swish and spit 5 mL QID PRN       Allergies:  Allergies  Allergen Reactions  . Adhesive [Tape] Rash    On on lower extremities     Past Medical History, Surgical history, Social history, and Family History were reviewed and updated.  Review of Systems: All other 10 point review of systems is negative.   Physical Exam:  weight is 152 lb (68.9 kg). Her oral temperature is 97.9 F (36.6 C). Her blood pressure is 127/60 and her pulse is 78. Her respiration is 19 and oxygen saturation is 98%.   Wt Readings from Last 3 Encounters:  02/01/17 152 lb (68.9 kg)  02/01/17 153 lb 6.4 oz (69.6 kg)  01/19/17 150 lb (68 kg)    Ocular: Sclerae unicteric, pupils equal, round and reactive to light Ear-nose-throat: Oropharynx clear, dentition fair Lymphatic: No cervical supraclavicular or axillary adenopathy Lungs no rales or rhonchi, good excursion bilaterally Heart regular rate and rhythm, no murmur appreciated Abd soft, nontender, positive bowel sounds, no liver or spleen tip palpated on exam, no fluid wave  MSK no focal spinal tenderness, no joint edema Neuro: non-focal, well-oriented, appropriate affect Breasts: Deferred  Lab Results  Component Value Date   WBC 8.2 02/01/2017   HGB 12.6 02/01/2017   HCT 36.9 02/01/2017   MCV 100 02/01/2017   PLT 275 02/01/2017   Lab Results  Component Value Date   FERRITIN 41 06/01/2016   IRON 66 06/01/2016   TIBC 364 06/01/2016   UIBC 298 06/01/2016   IRONPCTSAT 18 (L) 06/01/2016   Lab Results  Component Value Date   RETICCTPCT 1.6 02/03/2015   RBC 3.69 (L) 02/01/2017   RETICCTABS 62.4 02/03/2015   No results found for: KPAFRELGTCHN, LAMBDASER, KAPLAMBRATIO No results found for: IGGSERUM, IGA, IGMSERUM No results found for: Ronnald Ramp, A1GS, A2GS, Violet Baldy, MSPIKE,  SPEI   Chemistry      Component Value Date/Time   NA 137 01/18/2017   K 4.3 01/18/2017   CL 109 09/15/2015 1342   CO2 23 09/15/2015 1342   BUN 11 01/18/2017   CREATININE 0.9 01/18/2017   CREATININE 0.87 09/15/2015 1342   GLU 83 01/18/2017      Component Value Date/Time   CALCIUM 9.9 09/15/2015 1342   ALKPHOS 59 01/18/2017   AST 24 01/18/2017   ALT 41 (A) 01/18/2017   BILITOT 0.6 07/28/2014 0545     Impression and Plan: Ms. Pudwill is 83 yo female with iron deficiency anemia and a history of Wegener's Granulomatosis. She is on Cytoxan  for this.   Her hemoglobin is holding pretty stable. We'll see what her iron studies look like.   We will go ahead and plan to get her back to see [place] in another 3 months. I think we need to have 3 month follow-ups for right now.  She does tend to have acute episodes of bleeding.    Volanda Napoleon, MD 8/2/20182:18 PM

## 2017-02-01 NOTE — Patient Instructions (Signed)
For your Wegeners: Keep taking the Advair as you are doing Keep taking the Cytoxan and prednisone and Bactrim per the direction of your physicians in the rheumatology clinic  For the cough: We will check a chest x-ray today  For the asthma: Continue taking Advair Get a flu shot in the fall  We will see you back in 6 months or sooner if needed

## 2017-02-02 ENCOUNTER — Telehealth: Payer: Self-pay

## 2017-02-02 LAB — IRON AND TIBC
%SAT: 20 % — ABNORMAL LOW (ref 21–57)
IRON: 54 ug/dL (ref 41–142)
TIBC: 276 ug/dL (ref 236–444)
UIBC: 222 ug/dL (ref 120–384)

## 2017-02-02 LAB — RETICULOCYTES: Reticulocyte Count: 2.1 % (ref 0.6–2.6)

## 2017-02-02 LAB — FERRITIN: FERRITIN: 163 ng/mL (ref 9–269)

## 2017-02-02 NOTE — Telephone Encounter (Addendum)
-----   Message from Eliezer Bottom, NP sent at 02/02/2017  2:26 PM EDT ----- Regarding: Iron  Needs iron once next week. LOS sent to [facility]. Thank you!  Sarah  Above message left on VM for pt to call back with questions and to schedule appt. dph

## 2017-02-06 ENCOUNTER — Encounter: Payer: Self-pay | Admitting: Internal Medicine

## 2017-02-06 ENCOUNTER — Non-Acute Institutional Stay: Payer: Medicare Other | Admitting: Internal Medicine

## 2017-02-06 DIAGNOSIS — D500 Iron deficiency anemia secondary to blood loss (chronic): Secondary | ICD-10-CM

## 2017-02-06 DIAGNOSIS — J45909 Unspecified asthma, uncomplicated: Secondary | ICD-10-CM

## 2017-02-06 DIAGNOSIS — I2782 Chronic pulmonary embolism: Secondary | ICD-10-CM

## 2017-02-06 DIAGNOSIS — K219 Gastro-esophageal reflux disease without esophagitis: Secondary | ICD-10-CM | POA: Diagnosis not present

## 2017-02-06 DIAGNOSIS — M3130 Wegener's granulomatosis without renal involvement: Secondary | ICD-10-CM

## 2017-02-06 DIAGNOSIS — G5793 Unspecified mononeuropathy of bilateral lower limbs: Secondary | ICD-10-CM | POA: Diagnosis not present

## 2017-02-06 DIAGNOSIS — N3281 Overactive bladder: Secondary | ICD-10-CM | POA: Insufficient documentation

## 2017-02-06 NOTE — Progress Notes (Signed)
Location:  [facility] Room Number: [place] of Service:  [phone]) Provider:  Blanchie Serve MD  Blanchie Serve, MD  [name], MD as PCP - General (Internal Medicine) Unice Bailey, MD as Consulting Physician (Rheumatology) Rozetta Nunnery, MD as Consulting Physician (Otolaryngology) Marcial Pacas, MD as Consulting Physician (Neurology) Michel Bickers, MD as Consulting Physician (Infectious Diseases) Marin Olp Rudell Cobb, MD as Consulting Physician (Oncology) Juanito Doom, MD as Consulting Physician (Pulmonary Disease) Tanda Rockers, MD as Consulting Physician (Pulmonary Disease) Estill Dooms, MD as Consulting Physician (Geriatric Medicine) Coralie Keens, MD as Consulting Physician (General Surgery) Mast, Man X, NP as Nurse Practitioner (Nurse Practitioner)  Extended Emergency Contact Information Primary Emergency Contact: Haygood,Sandra Address: [address] [place] [address] Johnnette Litter of [place] Phone: [phone] Relation: Daughter Secondary Emergency Contact: Minier,[name] Address: [address] [place]          [place],  [address] [place] of [place] Phone: [phone] Mobile Phone: [phone] Relation: Son  Code Status: Full Code Goals of care: Advanced Directive information Advanced Directives 02/01/2017  Does Patient Have a Medical Advance Directive? Yes  Type of Paramedic of [place];Living will  Does patient want to make changes to medical advance directive? -  Copy of [place] in Chart? -  Would patient like information on creating a medical advance directive? -     Chief Complaint  Patient presents with  . Medical Management of Chronic Issues    Routine Visit     HPI:  Pt is a 83 y.o. female seen today for medical management of chronic diseases. She is lying in her bed. She denies any concern this  visit. She gets around without her motorized wheelchair. She has been sleeping good with her sleep aid. Her mood has been good. She has increased urinary frequency with nocturia. Her bowel move regularly. Energy level is fair. She has been followed by hematology for recurrent iron deficiency anemia and last received iv iron on December 2017.    Past Medical History:  Diagnosis Date  . Acute respiratory failure with hypoxia ([place])   . Anemia   . Aspergillosis ([place])   . Bladder incontinence   . Blindness of one eye   . Cancer ([place])    melanoma   ([place])  . Chronic sinusitis   . Clotting disorder ([place])   . Difficult intubation    Difficult airway with intubation for ARF 07/22/14 (due to anterior larynx, also had mucous plug)  . Fatigue 12/09/2015  . Glaucoma   . Hearing loss in left ear    hearing aid both ears  . History of pulmonary embolism 1997  . Interstitial emphysema ([place])   . Left shoulder pain 12/09/2015  . Multiple allergies   . Neuropathy   . OA (osteoarthritis)   . Pneumonia 12/15  . Shortness of breath dyspnea   . Stroke [facility])    ??? blind left eye  . Wegener's granulomatosis ([place])    Past Surgical History:  Procedure Laterality Date  . bronchosocpy     pt. states she has had over 10 in last 20 yrs.  Marland Kitchen CATARACT EXTRACTION    . COCHLEAR IMPLANT Left 10/27/2015   Procedure: LEFT COCHLEAR IMPLANT;  Surgeon: Leta Baptist, MD;  Location: [facility];  Service: ENT;  Laterality: Left;  . INGUINAL HERNIA REPAIR Left  02/17/2014   Procedure: LEFT INGUINAL HERNIA REPAIR WITH MESH;  Surgeon: Harl Bowie, MD;  Location: [facility];  Service: General;  Laterality: Left;  . INSERTION OF MESH N/A 02/17/2014   Procedure: INSERTION OF MESH;  Surgeon: Harl Bowie, MD;  Location: [facility];  Service: General;  Laterality: N/A;  . KNEE ARTHROSCOPY  2006   rt   . MELANOMA EXCISION     left leg  . PUBOVAGINAL SLING    . TEAR DUCT PROBING    . TUBAL  LIGATION    . TYMPANOMASTOIDECTOMY Left 04/28/2015  . TYMPANOMASTOIDECTOMY Left 04/28/2015   Procedure: LEFT TYMPANOMASTOIDECTOMY;  Surgeon: Leta Baptist, MD;  Location: [facility];  Service: ENT;  Laterality: Left;  Marland Kitchen VENA CAVA FILTER PLACEMENT  1997   [place] filter    Allergies  Allergen Reactions  . Adhesive [Tape] Rash    On on lower extremities     Outpatient Encounter Prescriptions as of 02/06/2017  Medication Sig  . acetaminophen (TYLENOL) 650 MG CR tablet Take 650 mg by mouth at bedtime. Can also take 650 mg by mouth 2 times daily as needed  . calcium-vitamin D (OSCAL WITH D) 500-200 MG-UNIT per tablet Take 1 tablet by mouth daily.   . cholecalciferol (VITAMIN D) 1000 UNITS tablet One daily for vitamin D supplement  . cyclophosphamide (CYTOXAN) 25 MG tablet Take 25 mg by mouth daily. Give on an empty stomach 1 hour before or 2 hours after meals. Take along with the 50 mg tablet to make a total of 75 mg  . cyclophosphamide (CYTOXAN) 50 MG tablet Take 50 mg by mouth daily. Give on an empty stomach 1 hour before or 2 hours after meals. Take along with 25 mg tablet to make 75 mg  . diclofenac sodium (VOLTAREN) 1 % GEL Apply 2 g topically 3 (three) times daily. Apply to left shoulder.  . Fluticasone-Salmeterol (ADVAIR DISKUS) 500-50 MCG/DOSE AEPB INHALE 1 PUFF EVERY 12 HOURS.  . folic acid (FOLVITE) 1 MG tablet Take 1 mg by mouth.  . gabapentin (NEURONTIN) 300 MG capsule Take 300 mg by mouth 3 (three) times daily.   Marland Kitchen guaiFENesin (MUCINEX) 600 MG 12 hr tablet Take 600 mg by mouth 2 (two) times daily.  . hydrocortisone cream 1 % Apply 1 application topically 4 (four) times daily as needed.   . latanoprost (XALATAN) 0.005 % ophthalmic solution 1 drop. One drop both eyes at bedtime  . levalbuterol (XOPENEX) 0.63 MG/3ML nebulizer solution Take 0.63 mg by nebulization every 6 (six) hours as needed for wheezing or shortness of breath.  Marland Kitchen LORazepam (ATIVAN) 0.5 MG tablet Take 0.5 mg by mouth at  bedtime.  . Multiple Vitamins-Minerals (THERAVIM-M) TABS Take 1 tablet by mouth daily.  . naphazoline-pheniramine (NAPHCON-A) 0.025-0.3 % ophthalmic solution 1 drop. One drop in left eye three times a day as needed for redness  . omeprazole (PRILOSEC) 20 MG capsule Take 20 mg by mouth daily.  Marland Kitchen oxybutynin (DITROPAN) 5 MG tablet One twice daily to help bladder control  . phenol (SORE THROAT SPRAY) 1.4 % LIQD Use as directed 1 spray in the mouth or throat as needed for throat irritation / pain.  . predniSONE (DELTASONE) 10 MG tablet Take 10 mg by mouth daily with breakfast. Start on 01/18/16. Stop date 02/06/17  . predniSONE (DELTASONE) 5 MG tablet Take 5 mg by mouth daily with breakfast. Start on 02/07/17. Stop date 03/07/17  . saccharomyces boulardii (FLORASTOR) 250 MG  capsule Take 250 mg by mouth daily.   . silver sulfADIAZINE (SILVADENE) 1 % cream Apply 1 application topically daily. Right inner thigh until healed  . sulfamethoxazole-trimethoprim (BACTRIM DS,SEPTRA DS) 800-160 MG per tablet Take 1 tablet by mouth every Monday, Wednesday, and Friday. HOLD WHILE ON AUGMENTIN, THEN RESUME AS PREVIOUS  . timolol (BETIMOL) 0.5 % ophthalmic solution Place 1 drop into the left eye daily.  Marland Kitchen UNABLE TO FIND Med Name: Duke's magic mouthwash. Swish and spit 5 mL QID PRN  . warfarin (COUMADIN) 3 MG tablet Take 3 mg by mouth daily.  . [DISCONTINUED] cyclophosphamide (CYTOXAN) 50 MG tablet Take 50 mg by mouth daily. Give on an empty stomach 1 hour before or 2 hours after meals.   No facility-administered encounter medications on file as of 02/06/2017.     Review of Systems  Constitutional: Negative for appetite change, chills, diaphoresis and fever.  HENT: Positive for hearing loss and rhinorrhea. Negative for congestion, mouth sores, nosebleeds, postnasal drip, sinus pressure, sore throat and trouble swallowing.   Eyes: Positive for visual disturbance.       Has corrective glasses  Respiratory: Negative for  cough, shortness of breath and wheezing.   Cardiovascular: Positive for leg swelling. Negative for chest pain and palpitations.  Gastrointestinal: Negative for abdominal pain, constipation, nausea and vomiting.  Genitourinary: Positive for frequency. Negative for dysuria.  Musculoskeletal: Positive for arthralgias, back pain and gait problem.  Skin: Negative for rash.  Neurological: Negative for dizziness, tremors, seizures and headaches.  Psychiatric/Behavioral: Negative for behavioral problems and sleep disturbance. The patient is not nervous/anxious.        Sleep aid has been helpful    Immunization History  Administered Date(s) Administered  . Influenza Split 03/04/2012, 03/11/2014, 04/01/2015  . Influenza,inj,Quad PF,36+ Mos 03/03/2013  . Influenza-Unspecified 04/01/2015, 04/19/2016  . PPD Test 05/25/2015  . Pneumococcal Conjugate-13 07/01/2015  . Pneumococcal Polysaccharide-23 09/02/2015  . Zoster 09/30/1994   Pertinent  Health Maintenance Due  Topic Date Due  . INFLUENZA VACCINE  04/02/2017 (Originally 01/31/2017)  . DEXA SCAN  Completed  . PNA vac Low Risk Adult  Completed   Fall Risk  12/09/2015 09/23/2015 09/09/2015 02/03/2015 12/04/2014  Falls in the past year? No Yes Yes Yes Yes  Number falls in past yr: - 2 or more 2 or more 2 or more 2 or more  Comment - - - - -  Injury with Fall? - No No - No   Functional Status Survey:    Vitals:   02/06/17 1445  BP: 120/70  Pulse: 82  Resp: 20  Temp: 99.2 F (37.3 C)  TempSrc: Oral  Weight: 151 lb 6.4 oz (68.7 kg)  Height: 5\' 1"  (1.549 m)   Body mass index is 28.61 kg/m. Physical Exam  Constitutional: She is oriented to person, place, and time. She appears well-developed and well-nourished. No distress.  HENT:  Head: Normocephalic and atraumatic.  Mouth/Throat: Oropharynx is clear and moist.  Eyes: Pupils are equal, round, and reactive to light. Conjunctivae and EOM are normal. Right eye exhibits no discharge. Left eye  exhibits no discharge.  Neck: Normal range of motion. Neck supple. No thyromegaly present.  Cardiovascular: Normal rate and regular rhythm.   Pulmonary/Chest: Effort normal. No respiratory distress. She has no wheezes. She has rales.  Abdominal: Soft. Bowel sounds are normal. There is no tenderness. There is no guarding.  Musculoskeletal: She exhibits no edema.  Limited ROM to left shoulder  Lymphadenopathy:    She  has no cervical adenopathy.  Neurological: She is alert and oriented to person, place, and time.  Skin: Skin is warm and dry. No rash noted. She is not diaphoretic.  Psychiatric: She has a normal mood and affect. Her behavior is normal.    Labs reviewed:  Recent Labs  12/05/16 01/04/17 01/18/17  NA 141 139 137  K 4.1 4.0 4.3  BUN 17 13 11   CREATININE 0.9 0.9 0.9    Recent Labs  12/05/16 01/04/17 01/18/17  AST 19 15 24   ALT 33 26 41*  ALKPHOS 54 48 59    Recent Labs  06/01/16 1247  01/04/17 01/18/17 02/01/17 1321  WBC 5.3  < > 6.1 6.9 8.2  NEUTROABS 3.4  --   --   --  7.6*  HGB 12.9  < > 12.0 12.3 12.6  HCT 39.6  < > 36 37 36.9  MCV 91  --   --   --  100  PLT 276  < > 201 284 275  < > = values in this interval not displayed. Lab Results  Component Value Date   TSH 1.56 05/13/2015   Lab Results  Component Value Date   HGBA1C 5.8 (H) 07/28/2014   No results found for: CHOL, HDL, LDLCALC, LDLDIRECT, TRIG, CHOLHDL  Significant Diagnostic Results in last 30 days:  Dg Chest 2 View  Result Date: 02/01/2017 CLINICAL DATA:  Chronic cough, congestion, hx of PNA, PE, emphysema, x-smoker. EXAM: CHEST  2 VIEW COMPARISON:  Chest x-ray dated 11/17/2014. Chest CT dated 10/16/2016. FINDINGS: Heart size and mediastinal contours appear stable given the slightly oblique patient positioning. Aortic atherosclerosis. Lungs are clear. Bullous change again noted at the left lung apex. Mild degenerative spurring noted throughout the kyphotic thoracic spine. No acute or  suspicious osseous finding. IMPRESSION: 1. No active cardiopulmonary disease. No evidence of pneumonia or pulmonary edema. 2. Bullous change within the left lung apex, as also demonstrated on earlier chest CT. 3. Aortic atherosclerosis. Electronically Signed   By: Franki Cabot M.D.   On: 02/01/2017 10:36    Assessment/Plan  Chronic iron def anemia stable Hemoglobin on review. No active bleed reported. Pt is scheduled for iv iron tomorrow.   Wegner's granulomatosis Currently on cytoxan and prednisone, monitor. Continue mucinex to help with her cough  Asthma No recent exacerbation, c/w advair, cytoxan, prednisone and bactrim. To update flu immunization this fall. Continue mucinex for cough  Neuropathy No fall reported. Continue gabapentin 300 mg tid, fall prevention  Chronic PE Breathing stable, continue warfarin   gerd Stable, continue omeprazole 20 mg daily    Family/ staff Communication: reviewed care plan with patient and charge nurse.   Labs/tests ordered:  None    I spent 40 minutes in total face-to-face time with the patient, more than 50% of which was spent in counseling and coordination of care, reviewing test results, reviewing medication and discussing or reviewing the diagnosis with patient.     Blanchie Serve, MD Internal Medicine [facility] [address] [place],  [address] Cell Phone (Monday-Friday 8 am - 5 pm): [phone] On Call: [phone] and follow prompts after 5 pm and on weekends Office Phone: [phone] Office Fax: [phone]

## 2017-02-07 ENCOUNTER — Ambulatory Visit: Payer: Medicare Other

## 2017-02-09 ENCOUNTER — Ambulatory Visit: Payer: Medicare Other

## 2017-02-09 ENCOUNTER — Telehealth: Payer: Self-pay

## 2017-02-09 ENCOUNTER — Ambulatory Visit (HOSPITAL_BASED_OUTPATIENT_CLINIC_OR_DEPARTMENT_OTHER): Payer: Medicare Other

## 2017-02-09 VITALS — BP 138/99 | HR 93 | Temp 98.3°F | Resp 20

## 2017-02-09 DIAGNOSIS — D638 Anemia in other chronic diseases classified elsewhere: Secondary | ICD-10-CM

## 2017-02-09 DIAGNOSIS — D509 Iron deficiency anemia, unspecified: Secondary | ICD-10-CM | POA: Diagnosis not present

## 2017-02-09 MED ORDER — SODIUM CHLORIDE 0.9 % IV SOLN
510.0000 mg | Freq: Once | INTRAVENOUS | Status: AC
  Administered 2017-02-09: 510 mg via INTRAVENOUS
  Filled 2017-02-09: qty 17

## 2017-02-09 MED ORDER — SODIUM CHLORIDE 0.9 % IV SOLN
Freq: Once | INTRAVENOUS | Status: AC
  Administered 2017-02-09: 11:00:00 via INTRAVENOUS

## 2017-02-09 NOTE — Patient Instructions (Signed)

## 2017-02-09 NOTE — Telephone Encounter (Signed)
[place] and spoke with Ms. Huntley Dec nurse,  ? Inez Catalina).  I expressed concern she was alone for her appointment today and she usually has someone with her.  I also explained that patients called yesterday to cancel her appt. for today, but did not remember doing so, and showed up for her iron infusion.  Nurse states from now on, she will see that a CNA accompanies her to all appointments.  I thanked her and told her this was the best decision for this particular patient.

## 2017-02-09 NOTE — Progress Notes (Signed)
Pt angry and crying about appt today.  States that transportation at [facility] "messed her appt up" today and that she wasn't suppose to be here until 02/12/17.  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  At time of discharge, pt.'s transportation at [facility] called x2 and took approx 1 1/2 hours to get to [facility] to pick up patient.

## 2017-02-12 ENCOUNTER — Ambulatory Visit: Payer: Medicare Other

## 2017-02-13 ENCOUNTER — Non-Acute Institutional Stay: Payer: Medicare Other | Admitting: Nurse Practitioner

## 2017-02-13 ENCOUNTER — Encounter: Payer: Self-pay | Admitting: Nurse Practitioner

## 2017-02-13 DIAGNOSIS — X12XXXA Contact with other hot fluids, initial encounter: Secondary | ICD-10-CM

## 2017-02-13 DIAGNOSIS — Z7901 Long term (current) use of anticoagulants: Secondary | ICD-10-CM | POA: Diagnosis not present

## 2017-02-13 DIAGNOSIS — T300 Burn of unspecified body region, unspecified degree: Secondary | ICD-10-CM | POA: Diagnosis not present

## 2017-02-13 LAB — PROTIME-INR: PROTIME: 24.8 — AB (ref 10.0–13.8)

## 2017-02-13 LAB — POCT INR: INR: 2.4 — AB (ref <=1.1)

## 2017-02-13 NOTE — Assessment & Plan Note (Signed)
Burn wound @ the right inner thigh, healing nicely, has been treated with Silvadene cream. She said its itching when in contact with her clothes. Will dc Silvadene cream, apply 1% hydrocortisone cream bid for itching. It should heal. Observe.
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
## 2017-02-13 NOTE — Assessment & Plan Note (Signed)
Hx of PE,  subtherapeutic INR 1.7 02/06/17, Coumadin was increased to 4mg  Tuesday and Thursday, continued 3mg  Monday, Wednesday, Friday, Saturday, Sunday subsequently, f/u in one week scheduled.

## 2017-02-14 ENCOUNTER — Other Ambulatory Visit: Payer: Self-pay | Admitting: *Deleted

## 2017-02-20 ENCOUNTER — Encounter: Payer: Self-pay | Admitting: Internal Medicine

## 2017-02-20 ENCOUNTER — Non-Acute Institutional Stay: Payer: Medicare Other | Admitting: Internal Medicine

## 2017-02-20 VITALS — BP 130/64 | HR 71 | Temp 97.9°F | Resp 16 | Ht 62.0 in | Wt 148.0 lb

## 2017-02-20 DIAGNOSIS — Z7189 Other specified counseling: Secondary | ICD-10-CM

## 2017-02-20 DIAGNOSIS — M3130 Wegener's granulomatosis without renal involvement: Secondary | ICD-10-CM | POA: Diagnosis not present

## 2017-02-20 NOTE — Progress Notes (Signed)
[facility]  Provider: Blanchie Serve MD   Location:  [place]   Place of Service:  Clinic (12)  PCP: Blanchie Serve, MD [name], MD as PCP - General (Internal Medicine) Unice Bailey, MD as Consulting Physician (Rheumatology) Rozetta Nunnery, MD as Consulting Physician (Otolaryngology) Marcial Pacas, MD as Consulting Physician (Neurology) Michel Bickers, MD as Consulting Physician (Infectious Diseases) Marin Olp Rudell Cobb, MD as Consulting Physician (Oncology) Juanito Doom, MD as Consulting Physician (Pulmonary Disease) Tanda Rockers, MD as Consulting Physician (Pulmonary Disease) Estill Dooms, MD as Consulting Physician (Geriatric Medicine) Coralie Keens, MD as Consulting Physician (General Surgery) Mast, Man X, NP as Nurse Practitioner (Nurse Practitioner)  Extended Emergency Contact Information Primary Emergency Contact: Haygood,Sandra Address: [address] [place] [address] Johnnette Litter of [place] Phone: [phone] Relation: Daughter Secondary Emergency Contact: Bonnin,[name] Address: [address] [place]          [place], [place] [address] Johnnette Litter of [place] Phone: [phone] Mobile Phone: [phone] Relation: Son   Goals of Care: Advanced Directive information Advanced Directives 02/13/2017  Does Patient Have a Medical Advance Directive? Yes  Type of Paramedic of [place];Living will  Does patient want to make changes to medical advance directive? No - Patient declined  Copy of [place] in Chart? Yes  Would patient like information on creating a medical advance directive? -      Chief Complaint  Patient presents with  . Acute Visit    medication concern    HPI: Patient is a 83 y.o. female seen today for acute visit. She has question about her cytoxan and if she can come off it. The cost was an  issue but medication has now been approved. She also feels the dose is strong for her. She has medical history of wegner's granulomatosis with chronic sinusitis, recurrent otitis media, hearing loss, peripheral neuropathy and persistent active scleritis currently on cytoxan, prednisone and bactrim dc. She is followed by rheumatology Dr Ang, pulmonology and ophthalmology services at [facility]. On chart review, she was last seen by Dr Demetrio Lapping office on 01/22/17 and had a 4 month follow up in 05/2017 . Her breathing has been stable. Denies any acute sinusitis or worsening of her cough or breathing. She was recently seen for routine follow up in her room. She made this appointment with concern of her medication but would prefer further visits in her room. No other concerns.   Past Medical History:  Diagnosis Date  . Acute respiratory failure with hypoxia ([place])   . Anemia   . Aspergillosis ([place])   . Bladder incontinence   . Blindness of one eye   . Cancer ([place])    melanoma   ([place])  . Chronic sinusitis   . Clotting disorder ([place])   . Difficult intubation    Difficult airway with intubation for ARF 07/22/14 (due to anterior larynx, also had mucous plug)  . Fatigue 12/09/2015  . Glaucoma   . Hearing loss in left ear    hearing aid both ears  . History of pulmonary embolism 1997  . Interstitial emphysema ([place])   . Left shoulder pain 12/09/2015  . Multiple allergies   . Neuropathy   . OA (osteoarthritis)   . Pneumonia 12/15  . Shortness of breath dyspnea   . Stroke [facility])    ??? blind  left eye  . Wegener's granulomatosis ([place])    Past Surgical History:  Procedure Laterality Date  . bronchosocpy     pt. states she has had over 10 in last 20 yrs.  Marland Kitchen CATARACT EXTRACTION    . COCHLEAR IMPLANT Left 10/27/2015   Procedure: LEFT COCHLEAR IMPLANT;  Surgeon: Leta Baptist, MD;  Location: [place];  Service: ENT;  Laterality: Left;  . INGUINAL HERNIA REPAIR Left 02/17/2014   Procedure: LEFT INGUINAL HERNIA REPAIR  WITH MESH;  Surgeon: Harl Bowie, MD;  Location: [place];  Service: General;  Laterality: Left;  . INSERTION OF MESH N/A 02/17/2014   Procedure: INSERTION OF MESH;  Surgeon: Harl Bowie, MD;  Location: [place];  Service: General;  Laterality: N/A;  . KNEE ARTHROSCOPY  2006   rt   . MELANOMA EXCISION     left leg  . PUBOVAGINAL SLING    . TEAR DUCT PROBING    . TUBAL LIGATION    . TYMPANOMASTOIDECTOMY Left 04/28/2015  . TYMPANOMASTOIDECTOMY Left 04/28/2015   Procedure: LEFT TYMPANOMASTOIDECTOMY;  Surgeon: Leta Baptist, MD;  Location: [place];  Service: ENT;  Laterality: Left;  Marland Kitchen VENA CAVA FILTER PLACEMENT  1997   [place] filter    reports that she quit smoking about 32 years ago. Her smoking use included Cigarettes. She has a 45.00 pack-year smoking history. She has never used smokeless tobacco. She reports that she does not drink alcohol or use drugs. Social History   Social History  . Marital status: Married    Spouse name: N/A  . Number of children: N/A  . Years of education: N/A   Occupational History  . retired Education officer, [employer] Retired   Social History Main Topics  . Smoking status: Former Smoker    Packs/day: 1.50    Years: 30.00    Types: Cigarettes    Quit date: 07/03/1984  . Smokeless tobacco: Never Used     Comment: quit smoking 30 years ago  . Alcohol use No  . Drug use: No  . Sexual activity: No   Other Topics Concern  . Not on file   Social History Narrative   Lives at [facility] moved to [place] 05/25/15   [place] dementia, died 2016/11/13   Former smoker stopped 1986   Alcohol occasional wine   Exercise PT 3 x a week   Power wheelchair   Has POA, Living Will       Functional Status Survey:    Family History  Problem Relation Age of Onset  . Heart disease Father   . CVA Father   . Congestive Heart Failure Mother   . Heart disease Mother   . Lymphoma Sister   . Dementia Sister   .  Alzheimer's disease Sister   . Uterine cancer Sister   . Skin cancer Sister   . Osteoarthritis Sister     Health Maintenance  Topic Date Due  . INFLUENZA VACCINE  04/02/2017 (Originally 01/31/2017)  . TETANUS/TDAP  07/03/2017 (Originally 09/29/1951)  . DEXA SCAN  Completed  . PNA vac Low Risk Adult  Completed    Allergies  Allergen Reactions  . Adhesive [Tape] Rash    On on lower extremities     Outpatient Encounter Prescriptions as of 02/20/2017  Medication Sig  . acetaminophen (TYLENOL) 650 MG CR tablet Take 650 mg by mouth at bedtime. Can also take 650 mg by mouth 2 times daily as needed  . calcium-vitamin D (  OSCAL WITH D) 500-200 MG-UNIT per tablet Take 1 tablet by mouth daily.   . cholecalciferol (VITAMIN D) 1000 UNITS tablet One daily for vitamin D supplement  . cyclophosphamide (CYTOXAN) 25 MG tablet Take 25 mg by mouth daily. Give on an empty stomach 1 hour before or 2 hours after meals. Take along with the 50 mg tablet to make a total of 75 mg  . cyclophosphamide (CYTOXAN) 50 MG tablet Take 50 mg by mouth daily. Give on an empty stomach 1 hour before or 2 hours after meals. Take along with 25 mg tablet to make 75 mg  . diclofenac sodium (VOLTAREN) 1 % GEL Apply 2 g topically 3 (three) times daily. Apply to left shoulder.  . Fluticasone-Salmeterol (ADVAIR DISKUS) 500-50 MCG/DOSE AEPB INHALE 1 PUFF EVERY 12 HOURS.  . folic acid (FOLVITE) 1 MG tablet Take 1 mg by mouth.  . gabapentin (NEURONTIN) 300 MG capsule Take 300 mg by mouth 3 (three) times daily.   Marland Kitchen guaiFENesin (MUCINEX) 600 MG 12 hr tablet Take 600 mg by mouth 2 (two) times daily.  . hydrocortisone cream 1 % Apply 1 application topically 4 (four) times daily as needed.   . latanoprost (XALATAN) 0.005 % ophthalmic solution 1 drop. One drop both eyes at bedtime  . levalbuterol (XOPENEX) 0.63 MG/3ML nebulizer solution Take 0.63 mg by nebulization every 6 (six) hours as needed for wheezing or shortness of breath.  Marland Kitchen  LORazepam (ATIVAN) 0.5 MG tablet Take 0.5 mg by mouth at bedtime.  . Multiple Vitamins-Minerals (THERAVIM-M) TABS Take 1 tablet by mouth daily.  . naphazoline-pheniramine (NAPHCON-A) 0.025-0.3 % ophthalmic solution 1 drop. One drop in left eye three times a day as needed for redness  . omeprazole (PRILOSEC) 20 MG capsule Take 20 mg by mouth daily.  Marland Kitchen oxybutynin (DITROPAN) 5 MG tablet One twice daily to help bladder control  . phenol (SORE THROAT SPRAY) 1.4 % LIQD Use as directed 1 spray in the mouth or throat as needed for throat irritation / pain.  . predniSONE (DELTASONE) 5 MG tablet Take 5 mg by mouth daily with breakfast. Start on 02/07/17. Stop date 03/07/17  . saccharomyces boulardii (FLORASTOR) 250 MG capsule Take 250 mg by mouth daily.   . silver sulfADIAZINE (SILVADENE) 1 % cream Apply 1 application topically daily. Right inner thigh until healed  . sulfamethoxazole-trimethoprim (BACTRIM DS,SEPTRA DS) 800-160 MG per tablet Take 1 tablet by mouth every Monday, Wednesday, and Friday. HOLD WHILE ON AUGMENTIN, THEN RESUME AS PREVIOUS  . timolol (BETIMOL) 0.5 % ophthalmic solution Place 1 drop into the left eye daily.  Marland Kitchen UNABLE TO FIND Med Name: Duke's magic mouthwash. Swish and spit 5 mL QID PRN  . warfarin (COUMADIN) 3 MG tablet Take 3 mg by mouth daily.  Marland Kitchen warfarin (COUMADIN) 4 MG tablet Take 4 mg by mouth daily. On Tues and Thurs.   No facility-administered encounter medications on file as of 02/20/2017.     Review of Systems  Constitutional: Negative for appetite change, chills, diaphoresis and fever.  HENT: Positive for hearing loss. Negative for ear discharge, ear pain, mouth sores, nosebleeds, sinus pain and sinus pressure.   Respiratory: Positive for cough. Negative for shortness of breath and wheezing.   Cardiovascular: Negative for chest pain and palpitations.  Gastrointestinal: Negative for nausea and vomiting.  Genitourinary: Negative for dysuria.  Musculoskeletal: Positive for  arthralgias and gait problem.  Skin: Negative for wound.  Neurological: Negative for syncope.  Psychiatric/Behavioral: Negative for behavioral problems.  Vitals:   02/20/17 0935  BP: 130/64  Pulse: 71  Resp: 16  Temp: 97.9 F (36.6 C)  TempSrc: Oral  SpO2: 90%  Weight: 148 lb (67.1 kg)  Height: 5\' 2"  (1.575 m)   Body mass index is 27.07 kg/m. Physical Exam  Constitutional: She is oriented to person, place, and time. She appears well-developed and well-nourished. No distress.  HENT:  Head: Normocephalic and atraumatic.  Mouth/Throat: Oropharynx is clear and moist.  Left cochlear implant  Eyes: Pupils are equal, round, and reactive to light. Conjunctivae are normal.  Neck: Neck supple. No JVD present.  Cardiovascular: Normal rate and regular rhythm.   Pulmonary/Chest: Effort normal. No respiratory distress. She has no wheezes. She has rales.  Abdominal: Soft. Bowel sounds are normal. There is no tenderness. There is no guarding.  Musculoskeletal:  On motorized wheelchair, can move all 4 extremities  Lymphadenopathy:    She has no cervical adenopathy.  Neurological: She is alert and oriented to person, place, and time.  Skin: Skin is warm and dry. She is not diaphoretic.  Psychiatric: Her behavior is normal.    Labs reviewed: Basic Metabolic Panel:  Recent Labs  12/05/16 01/04/17 01/18/17  NA 141 139 137  K 4.1 4.0 4.3  BUN 17 13 11   CREATININE 0.9 0.9 0.9   Liver Function Tests:  Recent Labs  12/05/16 01/04/17 01/18/17  AST 19 15 24   ALT 33 26 41*  ALKPHOS 54 48 59   No results for input(s): LIPASE, AMYLASE in the last 8760 hours. No results for input(s): AMMONIA in the last 8760 hours. CBC:  Recent Labs  06/01/16 1247  01/04/17 01/18/17 02/01/17 1321  WBC 5.3  < > 6.1 6.9 8.2  NEUTROABS 3.4  --   --   --  7.6*  HGB 12.9  < > 12.0 12.3 12.6  HCT 39.6  < > 36 37 36.9  MCV 91  --   --   --  100  PLT 276  < > 201 284 275  < > = values in this  interval not displayed. Cardiac Enzymes: No results for input(s): CKTOTAL, CKMB, CKMBINDEX, TROPONINI in the last 8760 hours. BNP: Invalid input(s): POCBNP Lab Results  Component Value Date   HGBA1C 5.8 (H) 07/28/2014   Lab Results  Component Value Date   TSH 1.56 05/13/2015   Lab Results  Component Value Date   VITAMINB12 675 07/28/2014   Lab Results  Component Value Date   FOLATE 12.4 07/28/2014   Lab Results  Component Value Date   IRON 54 02/01/2017   TIBC 276 02/01/2017   FERRITIN 163 02/01/2017    Lipid Panel: No results for input(s): CHOL, HDL, LDLCALC, TRIG, CHOLHDL, LDLDIRECT in the last 8760 hours. Lab Results  Component Value Date   HGBA1C 5.8 (H) 07/28/2014    Procedures since last visit: Dg Chest 2 View  Result Date: 02/01/2017 CLINICAL DATA:  Chronic cough, congestion, hx of PNA, PE, emphysema, x-smoker. EXAM: CHEST  2 VIEW COMPARISON:  Chest x-ray dated 11/17/2014. Chest CT dated 10/16/2016. FINDINGS: Heart size and mediastinal contours appear stable given the slightly oblique patient positioning. Aortic atherosclerosis. Lungs are clear. Bullous change again noted at the left lung apex. Mild degenerative spurring noted throughout the kyphotic thoracic spine. No acute or suspicious osseous finding. IMPRESSION: 1. No active cardiopulmonary disease. No evidence of pneumonia or pulmonary edema. 2. Bullous change within the left lung apex, as also demonstrated on earlier chest CT. 3. Aortic  atherosclerosis. Electronically Signed   By: Franki Cabot M.D.   On: 02/01/2017 10:36    Assessment/Plan  1. Encounter for medication counseling Explained to patient that she needs to take cyclophosphamide to help keep her flare up/ inflammation and damage to her lungs, eyes and sinuses from wegeners. She is concerned that being on it for long term will cause more damage. Reassured patient that blood work is being done on regular interval to keep check on her cell count. She  was recently seen by her rheumatologist and has another f/u in 05/2017. Explained the importance of being on cytoxan along with her prednisone and bactrim ds. Pt agrees to take these meds for now.   2. WEGENERS GRANULOMATOSIS Seen by pulmonary, rheumatology and ophthalmology for routine follow up. Continue cytoxan, chronic prednisone and bactrim ds. Currently no signs or symptom of infection. Monitor clinically.    Labs/tests ordered:  none  Next appointment: will be seen in her room  Communication: reviewed plan with pt   I spent 25 minutes in total face-to-face time with the patient, more than 50% of which was spent in counseling and coordination of care, reviewing test results, reviewing medication and discussing or reviewing the diagnosis and treatment plan with patient.      Blanchie Serve, MD Internal Medicine [facility] [address] [place], [place] [address] Cell Phone (Monday-Friday 8 am - 5 pm): [phone] On Call: [phone] and follow prompts after 5 pm and on weekends Office Phone: [phone] Office Fax: [phone]

## 2017-02-27 ENCOUNTER — Other Ambulatory Visit: Payer: Self-pay | Admitting: *Deleted

## 2017-02-27 LAB — PROTIME-INR: Protime: 25.7 — AB (ref 10.0–13.8)

## 2017-02-27 LAB — POCT INR: INR: 2.4 — AB (ref <=1.1)

## 2017-03-20 ENCOUNTER — Non-Acute Institutional Stay: Payer: Medicare Other

## 2017-03-20 ENCOUNTER — Other Ambulatory Visit: Payer: Self-pay

## 2017-03-20 DIAGNOSIS — Z0000 Encounter for general adult medical examination without abnormal findings: Secondary | ICD-10-CM

## 2017-03-20 MED ORDER — LORAZEPAM 0.5 MG PO TABS: 0.5000 mg | ORAL_TABLET | Freq: Every day | ORAL | 0 refills | Status: DC

## 2017-03-20 NOTE — Patient Instructions (Addendum)
[name] , Thank you for taking time to come for your Medicare Wellness Visit. I appreciate your ongoing commitment to your health goals. Please review the following plan we discussed and let me know if I can assist you in the future.   Screening recommendations/referrals: Colonoscopy excluded, pt over age 81 Mammogram excluded, pt over age 55 Bone Density up to date Recommended yearly ophthalmology/optometry visit for glaucoma screening and checkup Recommended yearly dental visit for hygiene and checkup  Vaccinations: Influenza vaccine due Pneumococcal vaccine up to date Tdap vaccine up to date. Due 12/28/2021 Shingles vaccine not in records    Advanced directives: In Chart  Conditions/risks identified: None  Next appointment: Dr. Bubba Camp makes rounds   Preventive Care 35 Years and Older, Female Preventive care refers to lifestyle choices and visits with your health care provider that can promote health and wellness. What does preventive care include?  A yearly physical exam. This is also called an annual well check.  Dental exams once or twice a year.  Routine eye exams. Ask your health care provider how often you should have your eyes checked.  Personal lifestyle choices, including:  Daily care of your teeth and gums.  Regular physical activity.  Eating a healthy diet.  Avoiding tobacco and drug use.  Limiting alcohol use.  Practicing safe sex.  Taking low-dose aspirin every day.  Taking vitamin and mineral supplements as recommended by your health care provider. What happens during an annual well check? The services and screenings done by your health care provider during your annual well check will depend on your age, overall health, lifestyle risk factors, and family history of disease. Counseling  Your health care provider may ask you questions about your:  Alcohol use.  Tobacco use.  Drug use.  Emotional well-being.  Home and relationship  well-being.  Sexual activity.  Eating habits.  History of falls.  Memory and ability to understand (cognition).  Work and work Statistician.  Reproductive health. Screening  You may have the following tests or measurements:  Height, weight, and BMI.  Blood pressure.  Lipid and cholesterol levels. These may be checked every 5 years, or more frequently if you are over 2 years old.  Skin check.  Lung cancer screening. You may have this screening every year starting at age 80 if you have a 30-pack-year history of smoking and currently smoke or have quit within the past 15 years.  Fecal occult blood test (FOBT) of the stool. You may have this test every year starting at age 34.  Flexible sigmoidoscopy or colonoscopy. You may have a sigmoidoscopy every 5 years or a colonoscopy every 10 years starting at age 55.  Hepatitis C blood test.  Hepatitis B blood test.  Sexually transmitted disease (STD) testing.  Diabetes screening. This is done by checking your blood sugar (glucose) after you have not eaten for a while (fasting). You may have this done every 1-3 years.  Bone density scan. This is done to screen for osteoporosis. You may have this done starting at age 27.  Mammogram. This may be done every 1-2 years. Talk to your health care provider about how often you should have regular mammograms. Talk with your health care provider about your test results, treatment options, and if necessary, the need for more tests. Vaccines  Your health care provider may recommend certain vaccines, such as:  Influenza vaccine. This is recommended every year.  Tetanus, diphtheria, and acellular pertussis (Tdap, Td) vaccine. You may need a  Td booster every 10 years.  Zoster vaccine. You may need this after age 86.  Pneumococcal 13-valent conjugate (PCV13) vaccine. One dose is recommended after age 70.  Pneumococcal polysaccharide (PPSV23) vaccine. One dose is recommended after age  69. Talk to your health care provider about which screenings and vaccines you need and how often you need them. This information is not intended to replace advice given to you by your health care provider. Make sure you discuss any questions you have with your health care provider. Document Released: 07/16/2015 Document Revised: 03/08/2016 Document Reviewed: 04/20/2015 Elsevier Interactive Patient Education  2017 [place] Prevention in the Home Falls can cause injuries. They can happen to people of all ages. There are many things you can do to make your home safe and to help prevent falls. What can I do on the outside of my home?  Regularly fix the edges of walkways and driveways and fix any cracks.  Remove anything that might make you trip as you walk through a door, such as a raised step or threshold.  Trim any bushes or trees on the path to your home.  Use bright outdoor lighting.  Clear any walking paths of anything that might make someone trip, such as rocks or tools.  Regularly check to see if handrails are loose or broken. Make sure that both sides of any steps have handrails.  Any raised decks and porches should have guardrails on the edges.  Have any leaves, snow, or ice cleared regularly.  Use sand or salt on walking paths during winter.  Clean up any spills in your garage right away. This includes oil or grease spills. What can I do in the bathroom?  Use night lights.  Install grab bars by the toilet and in the tub and shower. Do not use towel bars as grab bars.  Use non-skid mats or decals in the tub or shower.  If you need to sit down in the shower, use a plastic, non-slip stool.  Keep the floor dry. Clean up any water that spills on the floor as soon as it happens.  Remove soap buildup in the tub or shower regularly.  Attach bath mats securely with double-sided non-slip rug tape.  Do not have throw rugs and other things on the floor that can make  you trip. What can I do in the bedroom?  Use night lights.  Make sure that you have a light by your bed that is easy to reach.  Do not use any sheets or blankets that are too big for your bed. They should not hang down onto the floor.  Have a firm chair that has side arms. You can use this for support while you get dressed.  Do not have throw rugs and other things on the floor that can make you trip. What can I do in the kitchen?  Clean up any spills right away.  Avoid walking on wet floors.  Keep items that you use a lot in easy-to-reach places.  If you need to reach something above you, use a strong step stool that has a grab bar.  Keep electrical cords out of the way.  Do not use floor polish or wax that makes floors slippery. If you must use wax, use non-skid floor wax.  Do not have throw rugs and other things on the floor that can make you trip. What can I do with my stairs?  Do not leave any items on the stairs.  Make sure that there are handrails on both sides of the stairs and use them. Fix handrails that are broken or loose. Make sure that handrails are as long as the stairways.  Check any carpeting to make sure that it is firmly attached to the stairs. Fix any carpet that is loose or worn.  Avoid having throw rugs at the top or bottom of the stairs. If you do have throw rugs, attach them to the floor with carpet tape.  Make sure that you have a light switch at the top of the stairs and the bottom of the stairs. If you do not have them, ask someone to add them for you. What else can I do to help prevent falls?  Wear shoes that:  Do not have high heels.  Have rubber bottoms.  Are comfortable and fit you well.  Are closed at the toe. Do not wear sandals.  If you use a stepladder:  Make sure that it is fully opened. Do not climb a closed stepladder.  Make sure that both sides of the stepladder are locked into place.  Ask someone to hold it for you, if  possible.  Clearly mark and make sure that you can see:  Any grab bars or handrails.  First and last steps.  Where the edge of each step is.  Use tools that help you move around (mobility aids) if they are needed. These include:  Canes.  Walkers.  Scooters.  Crutches.  Turn on the lights when you go into a dark area. Replace any light bulbs as soon as they burn out.  Set up your furniture so you have a clear path. Avoid moving your furniture around.  If any of your floors are uneven, fix them.  If there are any pets around you, be aware of where they are.  Review your medicines with your doctor. Some medicines can make you feel dizzy. This can increase your chance of falling. Ask your doctor what other things that you can do to help prevent falls. This information is not intended to replace advice given to you by your health care provider. Make sure you discuss any questions you have with your health care provider. Document Released: 04/15/2009 Document Revised: 11/25/2015 Document Reviewed: 07/24/2014 Elsevier Interactive Patient Education  2017 [employer].

## 2017-03-20 NOTE — Progress Notes (Signed)
Subjective:   [name] is a 83 y.o. female who presents for Medicare Annual (Subsequent) preventive examination at [facility]  Last AWV-12/29/2011     Objective:     Vitals: BP 130/64 (BP Location: Right Arm, Patient Position: Sitting)   Pulse 60   Temp (!) 97.1 F (36.2 C) (Oral)   Ht 5\' 2"  (1.575 m)   Wt 148 lb (67.1 kg)   SpO2 95%   BMI 27.07 kg/m   Body mass index is 27.07 kg/m.   Tobacco History  Smoking Status  . Former Smoker  . Packs/day: 1.50  . Years: 30.00  . Types: Cigarettes  . Quit date: 07/03/1984  Smokeless Tobacco  . Never Used    Comment: quit smoking 30 years ago     Counseling given: Not Answered   Past Medical History:  Diagnosis Date  . Acute respiratory failure with hypoxia ([place])   . Anemia   . Aspergillosis ([place])   . Bladder incontinence   . Blindness of one eye   . Cancer ([place])    melanoma   ([place])  . Chronic sinusitis   . Clotting disorder ([place])   . Difficult intubation    Difficult airway with intubation for ARF 07/22/14 (due to anterior larynx, also had mucous plug)  . Fatigue 12/09/2015  . Glaucoma   . Hearing loss in left ear    hearing aid both ears  . History of pulmonary embolism 1997  . Interstitial emphysema ([place])   . Left shoulder pain 12/09/2015  . Multiple allergies   . Neuropathy   . OA (osteoarthritis)   . Pneumonia 12/15  . Shortness of breath dyspnea   . Stroke [facility])    ??? blind left eye  . Wegener's granulomatosis ([place])    Past Surgical History:  Procedure Laterality Date  . bronchosocpy     pt. states she has had over 10 in last 20 yrs.  Marland Kitchen CATARACT EXTRACTION    . COCHLEAR IMPLANT Left 10/27/2015   Procedure: LEFT COCHLEAR IMPLANT;  Surgeon: Leta Baptist, MD;  Location: [facility];  Service: ENT;  Laterality: Left;  . INGUINAL HERNIA REPAIR Left 02/17/2014   Procedure: LEFT INGUINAL HERNIA REPAIR WITH MESH;  Surgeon: Harl Bowie, MD;  Location: [facility];  Service: General;   Laterality: Left;  . INSERTION OF MESH N/A 02/17/2014   Procedure: INSERTION OF MESH;  Surgeon: Harl Bowie, MD;  Location: [facility];  Service: General;  Laterality: N/A;  . KNEE ARTHROSCOPY  2006   rt   . MELANOMA EXCISION     left leg  . PUBOVAGINAL SLING    . TEAR DUCT PROBING    . TUBAL LIGATION    . TYMPANOMASTOIDECTOMY Left 04/28/2015  . TYMPANOMASTOIDECTOMY Left 04/28/2015   Procedure: LEFT TYMPANOMASTOIDECTOMY;  Surgeon: Leta Baptist, MD;  Location: [facility];  Service: ENT;  Laterality: Left;  Marland Kitchen VENA CAVA FILTER PLACEMENT  1997   [place] filter   Family History  Problem Relation Age of Onset  . Heart disease Father   . CVA Father   . Congestive Heart Failure Mother   . Heart disease Mother   . Lymphoma Sister   . Dementia Sister   . Alzheimer's disease Sister   . Uterine cancer Sister   . Skin cancer Sister   . Osteoarthritis Sister    History  Sexual Activity  . Sexual activity: No    Outpatient Encounter Prescriptions as of  03/20/2017  Medication Sig  . acetaminophen (TYLENOL) 650 MG CR tablet Take 650 mg by mouth at bedtime. Can also take 650 mg by mouth 2 times daily as needed  . calcium-vitamin D (OSCAL WITH D) 500-200 MG-UNIT per tablet Take 1 tablet by mouth daily.   . cholecalciferol (VITAMIN D) 1000 UNITS tablet One daily for vitamin D supplement  . cyclophosphamide (CYTOXAN) 25 MG tablet Take 25 mg by mouth daily. Give on an empty stomach 1 hour before or 2 hours after meals. Take along with the 50 mg tablet to make a total of 75 mg  . cyclophosphamide (CYTOXAN) 50 MG tablet Take 50 mg by mouth daily. Give on an empty stomach 1 hour before or 2 hours after meals. Take along with 25 mg tablet to make 75 mg  . diclofenac sodium (VOLTAREN) 1 % GEL Apply 2 g topically 3 (three) times daily. Apply to left shoulder.  . Fluticasone-Salmeterol (ADVAIR DISKUS) 500-50 MCG/DOSE AEPB INHALE 1 PUFF EVERY 12 HOURS.  . folic acid (FOLVITE) 1 MG tablet  Take 1 mg by mouth daily.   Marland Kitchen gabapentin (NEURONTIN) 300 MG capsule Take 300 mg by mouth 3 (three) times daily.   Marland Kitchen guaiFENesin (MUCINEX) 600 MG 12 hr tablet Take 600 mg by mouth 2 (two) times daily.  . hydrocortisone cream 1 % Apply 1 application topically 4 (four) times daily as needed.   . latanoprost (XALATAN) 0.005 % ophthalmic solution 1 drop. One drop both eyes at bedtime  . levalbuterol (XOPENEX) 0.63 MG/3ML nebulizer solution Take 0.63 mg by nebulization every 6 (six) hours as needed for wheezing or shortness of breath.  . Multiple Vitamins-Minerals (THERAVIM-M) TABS Take 1 tablet by mouth daily.  . naphazoline-pheniramine (NAPHCON-A) 0.025-0.3 % ophthalmic solution 1 drop. One drop in left eye three times a day as needed for redness  . omeprazole (PRILOSEC) 20 MG capsule Take 20 mg by mouth daily.  Marland Kitchen oxybutynin (DITROPAN) 5 MG tablet One twice daily to help bladder control  . phenol (SORE THROAT SPRAY) 1.4 % LIQD Use as directed 1 spray in the mouth or throat as needed for throat irritation / pain.  Marland Kitchen saccharomyces boulardii (FLORASTOR) 250 MG capsule Take 250 mg by mouth daily.   Marland Kitchen sulfamethoxazole-trimethoprim (BACTRIM DS,SEPTRA DS) 800-160 MG per tablet Take 1 tablet by mouth every Monday, Wednesday, and Friday. HOLD WHILE ON AUGMENTIN, THEN RESUME AS PREVIOUS  . timolol (BETIMOL) 0.5 % ophthalmic solution Place 1 drop into the left eye daily.  Marland Kitchen UNABLE TO FIND Med Name: [employer]'s magic mouthwash. Swish and spit 5 mL QID PRN  . warfarin (COUMADIN) 3 MG tablet Take 3 mg by mouth daily.  Marland Kitchen warfarin (COUMADIN) 4 MG tablet Take 4 mg by mouth daily. On Tues and Thurs.  . [DISCONTINUED] LORazepam (ATIVAN) 0.5 MG tablet Take 0.5 mg by mouth at bedtime.  . silver sulfADIAZINE (SILVADENE) 1 % cream Apply 1 application topically daily. Right inner thigh until healed  . [DISCONTINUED] predniSONE (DELTASONE) 5 MG tablet Take 5 mg by mouth daily with breakfast. Start on 02/07/17. Stop date 03/07/17    No facility-administered encounter medications on file as of 03/20/2017.     Activities of Daily Living In your present state of health, do you have any difficulty performing the following activities: 03/20/2017  Hearing? Y  Comment L ear deaf  Vision? N  Difficulty concentrating or making decisions? N  Walking or climbing stairs? Y  Dressing or bathing? N  Doing errands, shopping?  Y  Preparing Food and eating ? Y  Using the Toilet? Y  In the past six months, have you accidently leaked urine? Y  Do you have problems with loss of bowel control? N  Managing your Medications? Y  Managing your Finances? Y  Housekeeping or managing your Housekeeping? Y  Some recent data might be hidden    [name], MD as PCP - General (Internal Medicine) Unice Bailey, MD as Consulting Physician (Rheumatology) Rozetta Nunnery, MD as Consulting Physician (Otolaryngology) Marcial Pacas, MD as Consulting Physician (Neurology) Michel Bickers, MD as Consulting Physician (Infectious Diseases) Marin Olp Rudell Cobb, MD as Consulting Physician (Oncology) Juanito Doom, MD as Consulting Physician (Pulmonary Disease) Tanda Rockers, MD as Consulting Physician (Pulmonary Disease) Estill Dooms, MD as Consulting Physician (Geriatric Medicine) Coralie Keens, MD as Consulting Physician (General Surgery) Mast, Man X, NP as Nurse Practitioner (Nurse Practitioner)    Assessment:    \ Exercise Activities and Dietary recommendations Current Exercise Habits: The patient does not participate in regular exercise at present, Exercise limited by: orthopedic condition(s)  Goals    . Exercise 3x per week (30 min per time)          Patient will go to chair exercise classes.      Fall Risk Fall Risk  03/20/2017 12/09/2015 09/23/2015 09/09/2015 02/03/2015  Falls in the past year? No No Yes Yes Yes  Number falls in past yr: - - 2 or more 2 or more 2 or more  Comment - - - - -   Injury with Fall? - - No No -   Depression Screen PHQ 2/9 Scores 03/20/2017 12/09/2015 11/26/2014 09/10/2014  PHQ - 2 Score 0 0 1 1     Cognitive Function MMSE - Mini Mental State Exam 12/19/2016 12/19/2016 12/09/2015  Orientation to time 5 5 5   Orientation to Place 5 5 5   Registration 3 3 3   Attention/ Calculation 5 5 5   Recall 3 3 3   Language- name 2 objects 2 2 2   Language- repeat 1 1 1   Language- follow 3 step command 3 3 3   Language- read & follow direction 1 1 1   Write a sentence 1 1 1   Copy design 1 1 1   Total score 30 30 30         Immunization History  Administered Date(s) Administered  . Influenza Split 03/04/2012, 03/11/2014, 04/01/2015  . Influenza,inj,Quad PF,6+ Mos 03/03/2013  . Influenza-Unspecified 04/01/2015, 04/19/2016  . PPD Test 05/25/2015  . Pneumococcal Conjugate-13 07/01/2015  . Pneumococcal Polysaccharide-23 09/02/2015  . Zoster 09/30/1994   Screening Tests Health Maintenance  Topic Date Due  . INFLUENZA VACCINE  04/02/2017 (Originally 01/31/2017)  . TETANUS/TDAP  07/03/2017 (Originally 09/29/1951)  . DEXA SCAN  Completed  . PNA vac Low Risk Adult  Completed      Plan:  I have personally reviewed and addressed the Medicare Annual Wellness questionnaire and have noted the following in the patient's chart:  A. Medical and social history B. Use of alcohol, tobacco or illicit drugs  C. Current medications and supplements D. Functional ability and status E.  Nutritional status F.  Physical activity G. Advance directives H. List of other physicians I.  Hospitalizations, surgeries, and ER visits in previous 12 months J.  [place] to include hearing, vision, cognitive, depression L. Referrals and appointments - none  In addition, I have reviewed and discussed with patient certain preventive protocols, quality metrics, and best practice recommendations. A  written personalized care plan for preventive services as well as general preventive  health recommendations were provided to patient.  See attached scanned questionnaire for additional information.   Signed,   Rich Reining, RN Nurse Health Advisor   Quick Notes   Health Maintenance: Patient will receive flu vaccine when facility gets it in stock     Abnormal Screen: MMSE 30/30 on 12/19/2016     Patient Concerns: Cytoxan makes her drowsy and would like for it to be given at night instead of in the mornings.     Nurse Concerns: none

## 2017-03-29 ENCOUNTER — Ambulatory Visit (HOSPITAL_BASED_OUTPATIENT_CLINIC_OR_DEPARTMENT_OTHER): Payer: Medicare Other

## 2017-03-29 ENCOUNTER — Ambulatory Visit (HOSPITAL_BASED_OUTPATIENT_CLINIC_OR_DEPARTMENT_OTHER): Payer: Medicare Other | Admitting: Hematology & Oncology

## 2017-03-29 VITALS — BP 128/64 | HR 101 | Temp 97.9°F | Resp 20 | Wt 143.0 lb

## 2017-03-29 DIAGNOSIS — M3130 Wegener's granulomatosis without renal involvement: Secondary | ICD-10-CM

## 2017-03-29 DIAGNOSIS — D509 Iron deficiency anemia, unspecified: Secondary | ICD-10-CM

## 2017-03-29 DIAGNOSIS — D500 Iron deficiency anemia secondary to blood loss (chronic): Secondary | ICD-10-CM

## 2017-03-29 LAB — HEPATIC FUNCTION PANEL
ALK PHOS: 89 (ref 25–125)
ALT: 17 (ref 7–35)
AST: 19 (ref 13–35)
Bilirubin, Total: 0.4

## 2017-03-29 LAB — CBC WITH DIFFERENTIAL (CANCER CENTER ONLY)
BASO#: 0 10*3/uL (ref 0.0–0.2)
BASO%: 0.7 % (ref 0.0–2.0)
EOS%: 2 % (ref 0.0–7.0)
Eosinophils Absolute: 0.1 10*3/uL (ref 0.0–0.5)
HCT: 33.9 % — ABNORMAL LOW (ref 34.8–46.6)
HGB: 11.4 g/dL — ABNORMAL LOW (ref 11.6–15.9)
LYMPH#: 0.3 10*3/uL — ABNORMAL LOW (ref 0.9–3.3)
LYMPH%: 10.6 % — ABNORMAL LOW (ref 14.0–48.0)
MCH: 36.8 pg — AB (ref 26.0–34.0)
MCHC: 33.6 g/dL (ref 32.0–36.0)
MCV: 109 fL — ABNORMAL HIGH (ref 81–101)
MONO#: 0.5 10*3/uL (ref 0.1–0.9)
MONO%: 16.4 % — AB (ref 0.0–13.0)
NEUT#: 2.1 10*3/uL (ref 1.5–6.5)
NEUT%: 70.3 % (ref 39.6–80.0)
Platelets: 442 10*3/uL — ABNORMAL HIGH (ref 145–400)
RBC: 3.1 10*6/uL — ABNORMAL LOW (ref 3.70–5.32)
RDW: 16.7 % — AB (ref 11.1–15.7)
WBC: 2.9 10*3/uL — ABNORMAL LOW (ref 3.9–10.0)

## 2017-03-29 LAB — CMP (CANCER CENTER ONLY)
ALT(SGPT): 28 U/L (ref 10–47)
AST: 39 U/L — AB (ref 11–38)
Albumin: 3.1 g/dL — ABNORMAL LOW (ref 3.3–5.5)
Alkaline Phosphatase: 120 U/L — ABNORMAL HIGH (ref 26–84)
BILIRUBIN TOTAL: 0.5 mg/dL (ref 0.20–1.60)
BUN, Bld: 7 mg/dL (ref 7–22)
CALCIUM: 10.2 mg/dL (ref 8.0–10.3)
CHLORIDE: 105 meq/L (ref 98–108)
CO2: 27 meq/L (ref 18–33)
CREATININE: 1.1 mg/dL (ref 0.6–1.2)
Glucose, Bld: 131 mg/dL — ABNORMAL HIGH (ref 73–118)
Potassium: 3.8 mEq/L (ref 3.3–4.7)
SODIUM: 137 meq/L (ref 128–145)
Total Protein: 6.7 g/dL (ref 6.4–8.1)

## 2017-03-29 LAB — BASIC METABOLIC PANEL
BUN: 8 (ref 4–21)
CREATININE: 0.8 (ref 0.5–1.1)
Glucose: 86
Potassium: 4.2 (ref 3.4–5.3)
Sodium: 142 (ref 137–147)

## 2017-03-29 LAB — CBC AND DIFFERENTIAL
HCT: 28 — AB (ref 36–46)
HEMOGLOBIN: 9.7 — AB (ref 12.0–16.0)
PLATELETS: 421 — AB (ref 150–399)
WBC: 2.8

## 2017-03-29 NOTE — Progress Notes (Signed)
Hematology and Oncology Follow Up Visit  [name] [id number] 11-25-1932 83 y.o. 03/29/2017   Principle Diagnosis:  Recurrent iron deficiency anemia Wegener's granulomatosis  Current Therapy:   IV iron as indicated - last received in August 2018    Interim History:  [name] is here for a follow-up. She iron back in August. At that time, her ferritin was 163 with an iron saturation of only 20%.  She does not feel well today. She is on Cytoxan because of her Wegener's granulomatosis. With the Wegener's, she just does not feel well.  She's had no obvious bleeding.  There is no diarrhea.  She's had a mild decrease in her appetite. There is no nausea or vomiting.  Thankfully, I don't think she's been hospitalized since we last saw her.  She's had a little bit of a cough. There is no hemoptysis. She's had no issues with her bowels or bladder.  Currently, I would say that her performance status is ECOG 2.    Medications:  Allergies as of 03/29/2017      Reactions   Adhesive [tape] Rash   On on lower extremities       Medication List       Accurate as of 03/29/17  2:49 PM. Always use your most recent med list.          acetaminophen 650 MG CR tablet Commonly known as:  TYLENOL Take 650 mg by mouth at bedtime. Can also take 650 mg by mouth 2 times daily as needed   calcium-vitamin D 500-200 MG-UNIT tablet Commonly known as:  OSCAL WITH D Take 1 tablet by mouth daily.   cholecalciferol 1000 units tablet Commonly known as:  VITAMIN D One daily for vitamin D supplement   cyclophosphamide 50 MG tablet Commonly known as:  CYTOXAN Take 50 mg by mouth daily. Give on an empty stomach 1 hour before or 2 hours after meals. Take along with 25 mg tablet to make 75 mg   cyclophosphamide 25 MG tablet Commonly known as:  CYTOXAN Take 25 mg by mouth daily. Give on an empty stomach 1 hour before or 2 hours after meals. Take along with the 50 mg tablet to make a total of 75  mg   diclofenac sodium 1 % Gel Commonly known as:  VOLTAREN Apply 2 g topically 3 (three) times daily. Apply to left shoulder.   Fluticasone-Salmeterol 500-50 MCG/DOSE Aepb Commonly known as:  ADVAIR DISKUS INHALE 1 PUFF EVERY 12 HOURS.   folic acid 1 MG tablet Commonly known as:  FOLVITE Take 1 mg by mouth daily.   gabapentin 300 MG capsule Commonly known as:  NEURONTIN Take 300 mg by mouth 3 (three) times daily.   guaiFENesin 600 MG 12 hr tablet Commonly known as:  MUCINEX Take 600 mg by mouth 2 (two) times daily.   hydrocortisone cream 1 % Apply 1 application topically 4 (four) times daily as needed.   latanoprost 0.005 % ophthalmic solution Commonly known as:  XALATAN 1 drop. One drop both eyes at bedtime   levalbuterol 0.63 MG/3ML nebulizer solution Commonly known as:  XOPENEX Take 0.63 mg by nebulization every 6 (six) hours as needed for wheezing or shortness of breath.   LORazepam 0.5 MG tablet Commonly known as:  ATIVAN Take 1 tablet (0.5 mg total) by mouth at bedtime.   NAPHCON-A 0.025-0.3 % ophthalmic solution Generic drug:  naphazoline-pheniramine 1 drop. One drop in left eye three times a day as needed for redness  omeprazole 20 MG capsule Commonly known as:  PRILOSEC Take 20 mg by mouth daily.   oxybutynin 5 MG tablet Commonly known as:  DITROPAN One twice daily to help bladder control   predniSONE 5 MG tablet Commonly known as:  DELTASONE   saccharomyces boulardii 250 MG capsule Commonly known as:  FLORASTOR Take 250 mg by mouth daily.   silver sulfADIAZINE 1 % cream Commonly known as:  SILVADENE Apply 1 application topically daily. Right inner thigh until healed   SORE THROAT SPRAY 1.4 % Liqd Generic drug:  phenol Use as directed 1 spray in the mouth or throat as needed for throat irritation / pain.   sulfamethoxazole-trimethoprim 800-160 MG tablet Commonly known as:  BACTRIM DS,SEPTRA DS Take 1 tablet by mouth every Monday, Wednesday,  and Friday. HOLD WHILE ON AUGMENTIN, THEN RESUME AS PREVIOUS   THERAVIM-M Tabs Take 1 tablet by mouth daily.   timolol 0.5 % ophthalmic solution Commonly known as:  BETIMOL Place 1 drop into the left eye daily.   timolol 0.5 % ophthalmic solution Commonly known as:  TIMOPTIC   UNABLE TO FIND Med Name: [facility]'s magic mouthwash. Swish and spit 5 mL QID PRN   warfarin 2.5 MG tablet Commonly known as:  COUMADIN            Discharge Care Instructions        Start     Ordered   03/29/17 0000  CBC with Differential ([facility])     03/29/17 1336   03/29/17 0000  CMP STAT ([facility] [place] only)     03/29/17 1336   03/29/17 0000  Ferritin     03/29/17 1336   03/29/17 0000  Iron and TIBC     03/29/17 1336   03/29/17 0000  Erythropoietin     03/29/17 1336      Allergies:  Allergies  Allergen Reactions  . Adhesive [Tape] Rash    On on lower extremities     Past Medical History, Surgical history, Social history, and Family History were reviewed and updated.  Review of Systems: As stated in the interim history  Physical Exam:  weight is 143 lb (64.9 kg). Her oral temperature is 97.9 F (36.6 C). Her blood pressure is 128/64 and her pulse is 101 (abnormal). Her respiration is 20 and oxygen saturation is 90%.   Wt Readings from Last 3 Encounters:  03/29/17 143 lb (64.9 kg)  03/20/17 148 lb (67.1 kg)  02/20/17 148 lb (67.1 kg)    Elderly white female. Head and neck exam shows no ocular or oral lesions. She has no adenopathy in the neck. There is no scleral icterus. Lungs are clear. There is no wheezes. Cardiac exam regular rate and rhythm with no murmurs or rubs or bruits. Abdomen is soft. She has good bowel sounds. There is no fluid wave. There is no palpable liver or spleen tip. Extremities show some chronic trace edema in her extremities. Skin exam shows some scattered ecchymoses and some actinic keratosis. Neurological exam is nonfocal.  Lab Results    Component Value Date   WBC 2.9 (L) 03/29/2017   HGB 11.4 (L) 03/29/2017   HCT 33.9 (L) 03/29/2017   MCV 109 (H) 03/29/2017   PLT 442 (H) 03/29/2017   Lab Results  Component Value Date   FERRITIN 163 02/01/2017   IRON 54 02/01/2017   TIBC 276 02/01/2017   UIBC 222 02/01/2017   IRONPCTSAT 20 (L) 02/01/2017   Lab Results  Component  Value Date   RETICCTPCT 1.6 02/03/2015   RBC 3.10 (L) 03/29/2017   RETICCTABS 62.4 02/03/2015   No results found for: KPAFRELGTCHN, LAMBDASER, KAPLAMBRATIO No results found for: IGGSERUM, IGA, IGMSERUM No results found for: Ronnald Ramp, A1GS, A2GS, Tillman Sers, SPEI   Chemistry      Component Value Date/Time   NA 137 03/29/2017 1340   K 3.8 03/29/2017 1340   CL 105 03/29/2017 1340   CO2 27 03/29/2017 1340   BUN 7 03/29/2017 1340   CREATININE 1.1 03/29/2017 1340   GLU 83 01/18/2017      Component Value Date/Time   CALCIUM 10.2 03/29/2017 1340   ALKPHOS 120 (H) 03/29/2017 1340   AST 39 (H) 03/29/2017 1340   ALT 28 03/29/2017 1340   BILITOT 0.50 03/29/2017 1340     Impression and Plan: Ms. Markes is An 83 year old white female. She has Wegener's granulomatosis. She has iron deficiency anemia.  We will see what her iron levels are. I suspect that they might be low again. Her platelet count is on the high side. Her MCV is on the higher side because she is on Cytoxan.  Her white blood cell count is low because of Cytoxan.  I told her that we have to call her to see if she needs to come in for iron.  She really is hard of hearing. Hopefully, she will understand what I am telling her.   She, thankfully, lives in a nursing home that is helping her out quite a bit.   We will see her back in another 6 weeks.     Volanda Napoleon, MD 9/27/20182:49 PM

## 2017-03-30 LAB — FERRITIN: Ferritin: 580 ng/ml — ABNORMAL HIGH (ref 9–269)

## 2017-03-30 LAB — IRON AND TIBC
%SAT: 21 % (ref 21–57)
Iron: 40 ug/dL — ABNORMAL LOW (ref 41–142)
TIBC: 188 ug/dL — AB (ref 236–444)
UIBC: 148 ug/dL (ref 120–384)

## 2017-03-30 LAB — ERYTHROPOIETIN: ERYTHROPOIETIN: 56.5 m[IU]/mL — AB (ref 2.6–18.5)

## 2017-04-03 ENCOUNTER — Encounter: Payer: Self-pay | Admitting: Adult Health

## 2017-04-03 ENCOUNTER — Non-Acute Institutional Stay (SKILLED_NURSING_FACILITY): Payer: Medicare Other | Admitting: Adult Health

## 2017-04-03 DIAGNOSIS — Z7901 Long term (current) use of anticoagulants: Secondary | ICD-10-CM | POA: Diagnosis not present

## 2017-04-03 DIAGNOSIS — Z86711 Personal history of pulmonary embolism: Secondary | ICD-10-CM

## 2017-04-03 DIAGNOSIS — R350 Frequency of micturition: Secondary | ICD-10-CM

## 2017-04-03 NOTE — Progress Notes (Signed)
DATE:  04/03/2017 MRN:  [id number]  BIRTHDAY: [birth year]  Facility:  Nursing Home Location:  [facility] Room Number: 782  LEVEL OF CARE:  SNF (31)  Contact Information    Name Relation Home Work Mobile   [place] Daughter   [phone]   Shameca, Landen [phone]  [phone]   Vernard Gambles   [phone]   Dennie Bible [phone]         Code Status History    Date Active Date Inactive Code Status Order ID Comments User Context   04/28/2015  2:43 PM 04/29/2015  1:34 PM Full Code [id number]  Leta Baptist, MD Inpatient   08/15/2014  3:32 AM 08/18/2014  4:09 PM Full Code [id number]  Theressa Millard, MD Inpatient   07/20/2014  3:51 PM 07/30/2014  7:35 PM Full Code [id number]  Reyne Dumas, MD ED       Chief Complaint  Patient presents with  . Acute Visit    Urinary frequency Coumadin therapy    HISTORY OF PRESENT ILLNESS:  This is an 83 year old female who is being seen for an acute visit. She lives in [place]. She was seen in the room today. She complained of increase in urinary frequency with occasional incontinence , no hematuria nor dysuria. She has INR of 1.6, subtherapeutic. She takes Coumadin for her history of PE. No complaints of SOB. She has PMH of OA right knee, interstitial emphysema and glaucoma.    PAST MEDICAL HISTORY:  Past Medical History:  Diagnosis Date  . Acute respiratory failure with hypoxia ([place])   . Anemia   . Aspergillosis ([place])   . Bladder incontinence   . Blindness of one eye   . Cancer ([place])    melanoma   (chemo)  . Chronic sinusitis   . Clotting disorder ([employer])   . Difficult intubation    Difficult airway with intubation for ARF 07/22/14 (due to anterior larynx, also had mucous plug)  . Fatigue 12/09/2015  . Glaucoma   . Hearing loss in left ear    hearing aid both ears  . History of pulmonary embolism 1997  . Interstitial emphysema ([place])   . Left shoulder pain  12/09/2015  . Multiple allergies   . Neuropathy   . OA (osteoarthritis)   . Pneumonia 12/15  . Shortness of breath dyspnea   . Stroke [facility])    ??? blind left eye  . Wegener's granulomatosis ([place])      CURRENT MEDICATIONS: Reviewed  Patient's Medications  New Prescriptions   No medications on file  Previous Medications   ACETAMINOPHEN (TYLENOL) 650 MG CR TABLET    Take 650 mg by mouth at bedtime. Can also take 650 mg by mouth 2 times daily as needed   CALCIUM-VITAMIN D (OSCAL WITH D) 500-200 MG-UNIT PER TABLET    Take 1 tablet by mouth daily.    CHOLECALCIFEROL (VITAMIN D) 1000 UNITS TABLET    One daily for vitamin D supplement   CYCLOPHOSPHAMIDE (CYTOXAN) 25 MG TABLET    Take 25 mg by mouth daily. Give on an empty stomach 1 hour before or 2 hours after meals. Take along with the 50 mg tablet to make a total of 75 mg   CYCLOPHOSPHAMIDE (CYTOXAN) 50 MG TABLET    Take 50 mg by mouth daily. Give on an empty stomach 1 hour before or 2 hours after meals. Take along with 25 mg tablet to make 75 mg   DICLOFENAC  SODIUM (VOLTAREN) 1 % GEL    Apply 2 g topically 3 (three) times daily. Apply to left shoulder.   FLUTICASONE-SALMETEROL (ADVAIR DISKUS) 500-50 MCG/DOSE AEPB    INHALE 1 PUFF EVERY 12 HOURS.   FOLIC ACID (FOLVITE) 1 MG TABLET    Take 1 mg by mouth daily.    GABAPENTIN (NEURONTIN) 300 MG CAPSULE    Take 300 mg by mouth 3 (three) times daily.    GUAIFENESIN (MUCINEX) 600 MG 12 HR TABLET    Take 600 mg by mouth 2 (two) times daily.   HYDROCORTISONE CREAM 1 %    Apply 1 application topically 4 (four) times daily as needed.    LATANOPROST (XALATAN) 0.005 % OPHTHALMIC SOLUTION    1 drop. One drop both eyes at bedtime   LEVALBUTEROL (XOPENEX) 0.63 MG/3ML NEBULIZER SOLUTION    Take 0.63 mg by nebulization every 6 (six) hours as needed for wheezing or shortness of breath.   LORAZEPAM (ATIVAN) 0.5 MG TABLET    Take 1 tablet (0.5 mg total) by mouth at bedtime.   MULTIPLE VITAMINS-MINERALS  (THERAVIM-M) TABS    Take 1 tablet by mouth daily.   NAPHAZOLINE-PHENIRAMINE (NAPHCON-A) 0.025-0.3 % OPHTHALMIC SOLUTION    1 drop. One drop in left eye three times a day as needed for redness   OMEPRAZOLE (PRILOSEC) 20 MG CAPSULE    Take 20 mg by mouth daily.   OXYBUTYNIN (DITROPAN) 5 MG TABLET    One twice daily to help bladder control   PHENOL (SORE THROAT SPRAY) 1.4 % LIQD    Use as directed 1 spray in the mouth or throat as needed for throat irritation / pain.   SACCHAROMYCES BOULARDII (FLORASTOR) 250 MG CAPSULE    Take 250 mg by mouth daily.    SULFAMETHOXAZOLE-TRIMETHOPRIM (BACTRIM DS,SEPTRA DS) 800-160 MG PER TABLET    Take 1 tablet by mouth every Monday, Wednesday, and Friday. HOLD WHILE ON AUGMENTIN, THEN RESUME AS PREVIOUS   TIMOLOL (BETIMOL) 0.5 % OPHTHALMIC SOLUTION    Place 1 drop into the left eye daily.   UNABLE TO FIND    Med Name: Duke's magic mouthwash. Swish and spit 5 mL QID PRN   WARFARIN (COUMADIN) 4 MG TABLET    Take 4 mg by mouth 2 (two) times a week. Monday and Tuesday  Modified Medications   No medications on file  Discontinued Medications   PREDNISONE (DELTASONE) 5 MG TABLET       SILVER SULFADIAZINE (SILVADENE) 1 % CREAM    Apply 1 application topically daily. Right inner thigh until healed   TIMOLOL (TIMOPTIC) 0.5 % OPHTHALMIC SOLUTION       WARFARIN (COUMADIN) 2.5 MG TABLET       WARFARIN (COUMADIN) 3 MG TABLET    Take 3 mg by mouth 2 (two) times a week. Wednesday and Thursday     Allergies  Allergen Reactions  . Adhesive [Tape] Rash    On on lower extremities      REVIEW OF SYSTEMS:  GENERAL: no change in appetite, no fatigue, no weight changes, no fever, chills or weakness MOUTH and THROAT: Denies oral discomfort  RESPIRATORY: no cough, SOB, DOE, wheezing, hemoptysis CARDIAC: no chest pain, edema or palpitations GI: no abdominal pain, diarrhea, constipation, heart burn, nausea or vomiting GU: + urinary frequency and incontinence PSYCHIATRIC: Denies  feeling of depression or anxiety. No report of hallucinations, insomnia, paranoia, or agitation    PHYSICAL EXAMINATION  GENERAL APPEARANCE: Well nourished. In no acute distress.  Normal body habitus SKIN:  Skin is warm and dry.  MOUTH and THROAT: Lips are without lesions. Oral mucosa is moist and without lesions.  RESPIRATORY: breathing is even & unlabored, BS CTAB CARDIAC: RRR, no murmur,no extra heart sounds, no edema GI: abdomen soft, normal BS, no masses, no tenderness EXTREMITIES:  Able to move X 4 extremities, walks with a walker PSYCHIATRIC: Alert to self and place, disoriented to time. Affect and behavior are appropriate    LABS/RADIOLOGY: Labs reviewed: Basic Metabolic Panel:  Recent Labs  01/18/17 03/29/17 03/29/17 1340  NA 137 142 137  K 4.3 4.2 3.8  CL  --   --  105  CO2  --   --  27  GLUCOSE  --   --  131*  BUN 11 8 7   CREATININE 0.9 0.8 1.1  CALCIUM  --   --  10.2   Liver Function Tests:  Recent Labs  01/18/17 03/29/17 03/29/17 1340  AST 24 19 39*  ALT 41* 17 28  ALKPHOS 59 89 120*  BILITOT  --   --  0.50  PROT  --   --  6.7  ALBUMIN  --   --  3.1*   CBC:  Recent Labs  06/01/16 1247  02/01/17 1321 03/29/17 03/29/17 1340  WBC 5.3  < > 8.2 2.8 2.9*  NEUTROABS 3.4  --  7.6*  --  2.1  HGB 12.9  < > 12.6 9.7* 11.4*  HCT 39.6  < > 36.9 28* 33.9*  MCV 91  --  100  --  109*  PLT 276  < > 275 421* 442*  < > = values in this interval not displayed.    ASSESSMENT/PLAN:  1. Urinary frequency  - will check for urinalysis with culture and sensitivity to rule out UTI, assist with perineal hygiene   2. Long term current use of anticoagulant - INR 1.6, subtherapeutic, inrease Coumadin to 4 mg PO Q Mon and Tuesday then Coumadin 3 mg Q Wednesday and Thursday, INR check on 04/06/17   3. Personal history of PE (pulmonary embolism) - no SOB, continue Coumadin        Durenda Age, NP [employer] [phone]

## 2017-04-06 ENCOUNTER — Encounter: Payer: Self-pay | Admitting: Internal Medicine

## 2017-04-06 ENCOUNTER — Non-Acute Institutional Stay: Payer: Medicare Other | Admitting: Internal Medicine

## 2017-04-06 DIAGNOSIS — G600 Hereditary motor and sensory neuropathy: Secondary | ICD-10-CM | POA: Diagnosis not present

## 2017-04-06 DIAGNOSIS — R5383 Other fatigue: Secondary | ICD-10-CM | POA: Diagnosis not present

## 2017-04-06 DIAGNOSIS — D500 Iron deficiency anemia secondary to blood loss (chronic): Secondary | ICD-10-CM

## 2017-04-06 NOTE — Progress Notes (Signed)
Location:  [facility] Room Number: [place] of Service:  ALF [phone]) Provider:  Blanchie Serve, MD  Blanchie Serve, MD  [name], MD as PCP - General (Internal Medicine) Unice Bailey, MD as Consulting Physician (Rheumatology) Rozetta Nunnery, MD as Consulting Physician (Otolaryngology) Marcial Pacas, MD as Consulting Physician (Neurology) Michel Bickers, MD as Consulting Physician (Infectious Diseases) Marin Olp Rudell Cobb, MD as Consulting Physician (Oncology) Juanito Doom, MD as Consulting Physician (Pulmonary Disease) Tanda Rockers, MD as Consulting Physician (Pulmonary Disease) Estill Dooms, MD as Consulting Physician (Geriatric Medicine) Coralie Keens, MD as Consulting Physician (General Surgery) Mast, Man X, NP as Nurse Practitioner (Nurse Practitioner)  Extended Emergency Contact Information Primary Emergency Contact: Haygood,Sandra Address: [address] [place] [address] Johnnette Litter of [place] Phone: [phone] Relation: Daughter Secondary Emergency Contact: Bates,[name] Address: [address] [place]          [place], [place] [address] [place] Phone: [phone] Mobile Phone: [phone] Relation: Son  Code Status:  Full Code  Goals of care: Advanced Directive information Advanced Directives 04/06/2017  Does Patient Have a Medical Advance Directive? Yes  Type of Paramedic of [place];Living will  Does patient want to make changes to medical advance directive? No - Patient declined  Copy of [place] in Chart? Yes  Would patient like information on creating a medical advance directive? -     Chief Complaint  Patient presents with  . Acute Visit    Increased sleepness and fatigue    HPI:  Pt is a 83 y.o. female seen today for an acute visit for increased daytime sleepiness and fatigue x few weeks. Her  daughter and son in law are concerned about her energy level. She is on cytoxan for wegner's granulomatosis. She also has iron deficiency anemia with low Hb and gets blood transfusion and iron infusion from time to time. She is followed by pulmonologist, rheumatologist and oncologist as well. Her family would like her iron store checked, infectious workup done if required. I have reviewed the email from family. Patient seen today with charge nurse present.    Past Medical History:  Diagnosis Date  . Acute respiratory failure with hypoxia ([place])   . Anemia   . Aspergillosis ([place])   . Bladder incontinence   . Blindness of one eye   . Cancer ([place])    melanoma   ([place])  . Chronic sinusitis   . Clotting disorder ([place])   . Difficult intubation    Difficult airway with intubation for ARF 07/22/14 (due to anterior larynx, also had mucous plug)  . Fatigue 12/09/2015  . Glaucoma   . Hearing loss in left ear    hearing aid both ears  . History of pulmonary embolism 1997  . Interstitial emphysema ([place])   . Left shoulder pain 12/09/2015  . Multiple allergies   . Neuropathy   . OA (osteoarthritis)   . Pneumonia 12/15  . Shortness of breath dyspnea   . Stroke [facility])    ??? blind left eye  . Wegener's granulomatosis ([place])    Past Surgical History:  Procedure Laterality Date  . bronchosocpy     pt. states she has had over 10 in last 20 yrs.  Marland Kitchen CATARACT EXTRACTION    . COCHLEAR IMPLANT Left 10/27/2015   Procedure: LEFT COCHLEAR IMPLANT;  Surgeon: Kasandra Knudsen  Benjamine Mola, MD;  Location: [facility];  Service: ENT;  Laterality: Left;  . INGUINAL HERNIA REPAIR Left 02/17/2014   Procedure: LEFT INGUINAL HERNIA REPAIR WITH MESH;  Surgeon: Harl Bowie, MD;  Location: [facility];  Service: General;  Laterality: Left;  . INSERTION OF MESH N/A 02/17/2014   Procedure: INSERTION OF MESH;  Surgeon: Harl Bowie, MD;  Location: [facility];  Service: General;  Laterality: N/A;  . KNEE  ARTHROSCOPY  2006   rt   . MELANOMA EXCISION     left leg  . PUBOVAGINAL SLING    . TEAR DUCT PROBING    . TUBAL LIGATION    . TYMPANOMASTOIDECTOMY Left 04/28/2015  . TYMPANOMASTOIDECTOMY Left 04/28/2015   Procedure: LEFT TYMPANOMASTOIDECTOMY;  Surgeon: Leta Baptist, MD;  Location: [facility];  Service: ENT;  Laterality: Left;  Marland Kitchen VENA CAVA FILTER PLACEMENT  1997   [place] filter    Allergies  Allergen Reactions  . Adhesive [Tape] Rash    On on lower extremities     Outpatient Encounter Prescriptions as of 04/06/2017  Medication Sig  . acetaminophen (TYLENOL) 650 MG CR tablet Take 650 mg by mouth at bedtime. Can also take 650 mg by mouth 2 times daily as needed  . calcium-vitamin D (OSCAL WITH D) 500-200 MG-UNIT per tablet Take 1 tablet by mouth daily.   . cholecalciferol (VITAMIN D) 1000 UNITS tablet One daily for vitamin D supplement  . cyclophosphamide (CYTOXAN) 25 MG tablet Take 25 mg by mouth daily. Give on an empty stomach 1 hour before or 2 hours after meals. Take along with the 50 mg tablet to make a total of 75 mg  . cyclophosphamide (CYTOXAN) 50 MG tablet Take 50 mg by mouth daily. Give on an empty stomach 1 hour before or 2 hours after meals. Take along with 25 mg tablet to make 75 mg  . diclofenac sodium (VOLTAREN) 1 % GEL Apply 2 g topically 3 (three) times daily. Apply to left shoulder.  . Fluticasone-Salmeterol (ADVAIR DISKUS) 500-50 MCG/DOSE AEPB INHALE 1 PUFF EVERY 12 HOURS.  . folic acid (FOLVITE) 1 MG tablet Take 1 mg by mouth daily.   Marland Kitchen gabapentin (NEURONTIN) 300 MG capsule Take 300 mg by mouth 3 (three) times daily.   Marland Kitchen guaiFENesin (MUCINEX) 600 MG 12 hr tablet Take 600 mg by mouth 2 (two) times daily.  . hydrocortisone cream 1 % Apply 1 application topically daily.   . hydrocortisone cream 1 % Apply 1 application topically 4 (four) times daily as needed for itching (in addition to the scheduled does).  Marland Kitchen latanoprost (XALATAN) 0.005 % ophthalmic solution 1 drop. One drop  both eyes at bedtime  . levalbuterol (XOPENEX) 0.63 MG/3ML nebulizer solution Take 0.63 mg by nebulization every 6 (six) hours as needed for wheezing or shortness of breath.  Marland Kitchen LORazepam (ATIVAN) 0.5 MG tablet Take 1 tablet (0.5 mg total) by mouth at bedtime.  . Multiple Vitamins-Minerals (THERAVIM-M) TABS Take 1 tablet by mouth daily.  . naphazoline-pheniramine (NAPHCON-A) 0.025-0.3 % ophthalmic solution 1 drop. One drop in left eye three times a day as needed for redness  . omeprazole (PRILOSEC) 20 MG capsule Take 20 mg by mouth daily.  Marland Kitchen oxybutynin (DITROPAN) 5 MG tablet One twice daily to help bladder control  . phenol (SORE THROAT SPRAY) 1.4 % LIQD Use as directed 1 spray in the mouth or throat as needed for throat irritation / pain.  Marland Kitchen saccharomyces boulardii (FLORASTOR) 250 MG  capsule Take 250 mg by mouth daily.   Marland Kitchen sulfamethoxazole-trimethoprim (BACTRIM DS,SEPTRA DS) 800-160 MG per tablet Take 1 tablet by mouth every Monday, Wednesday, and Friday. HOLD WHILE ON AUGMENTIN, THEN RESUME AS PREVIOUS  . timolol (BETIMOL) 0.5 % ophthalmic solution Place 1 drop into the left eye daily.  Marland Kitchen UNABLE TO FIND Med Name: Duke's magic mouthwash. Swish and spit 5 mL QID PRN  . [DISCONTINUED] warfarin (COUMADIN) 4 MG tablet Take 4 mg by mouth 2 (two) times a week. Monday and Tuesday   No facility-administered encounter medications on file as of 04/06/2017.     Review of Systems  Constitutional: Positive for fatigue. Negative for appetite change, chills, diaphoresis and fever.  HENT: Positive for hearing loss. Negative for congestion, mouth sores, sore throat and trouble swallowing.   Respiratory: Positive for shortness of breath. Negative for cough and choking.        With exertion  Cardiovascular: Negative for chest pain and palpitations.  Gastrointestinal: Negative for abdominal pain, constipation, diarrhea, nausea and vomiting.  Genitourinary: Negative for dysuria, flank pain, frequency, hematuria  and pelvic pain.  Musculoskeletal: Positive for gait problem.  Skin: Negative for rash.  Neurological: Negative for dizziness, syncope and headaches.  Psychiatric/Behavioral: Negative for behavioral problems.    Immunization History  Administered Date(s) Administered  . Influenza Split 03/04/2012, 03/11/2014, 04/01/2015  . Influenza,inj,Quad PF,6+ Mos 03/03/2013  . Influenza-Unspecified 04/01/2015, 04/19/2016  . PPD Test 05/25/2015  . Pneumococcal Conjugate-13 07/01/2015  . Pneumococcal Polysaccharide-23 09/02/2015  . Zoster 09/30/1994   Pertinent  Health Maintenance Due  Topic Date Due  . INFLUENZA VACCINE  04/11/2017 (Originally 01/31/2017)  . DEXA SCAN  Completed  . PNA vac Low Risk Adult  Completed   Fall Risk  03/20/2017 12/09/2015 09/23/2015 09/09/2015 02/03/2015  Falls in the past year? No No Yes Yes Yes  Number falls in past yr: - - 2 or more 2 or more 2 or more  Comment - - - - -  Injury with Fall? - - No No -   Functional Status Survey:    Vitals:   04/06/17 0918  BP: 122/68  Pulse: 70  Resp: 18  Temp: 98.1 F (36.7 C)  TempSrc: Oral  Weight: 139 lb 6.4 oz (63.2 kg)  Height: 5\' 2"  (1.575 m)   Body mass index is 25.5 kg/m.   Wt Readings from Last 3 Encounters:  04/06/17 139 lb 6.4 oz (63.2 kg)  04/03/17 140 lb 3.2 oz (63.6 kg)  03/29/17 143 lb (64.9 kg)   Physical Exam  Constitutional: She is oriented to person, place, and time. She appears well-developed and well-nourished. No distress.  HENT:  Head: Normocephalic and atraumatic.  Mouth/Throat: Oropharynx is clear and moist.  Eyes: Pupils are equal, round, and reactive to light. Conjunctivae are normal.  Neck: Neck supple.  Cardiovascular: Normal rate and regular rhythm.   Pulmonary/Chest: Effort normal. She has no wheezes. She has no rales.  Poor air movement, no added sounds  Abdominal: Soft. Bowel sounds are normal. She exhibits no distension. There is no tenderness. There is no rebound and no guarding.   Musculoskeletal: She exhibits edema.  Trace leg edema, can move all 4 extremities, using rolling walker  Lymphadenopathy:    She has no cervical adenopathy.  Neurological: She is alert and oriented to person, place, and time.  Skin: Skin is warm and dry. She is not diaphoretic. There is pallor.  Psychiatric: She has a normal mood and affect.  Labs reviewed:  Recent Labs  01/18/17 03/29/17 03/29/17 1340  NA 137 142 137  K 4.3 4.2 3.8  CL  --   --  105  CO2  --   --  27  GLUCOSE  --   --  131*  BUN 11 8 7   CREATININE 0.9 0.8 1.1  CALCIUM  --   --  10.2    Recent Labs  01/18/17 03/29/17 03/29/17 1340  AST 24 19 39*  ALT 41* 17 28  ALKPHOS 59 89 120*  BILITOT  --   --  0.50  PROT  --   --  6.7  ALBUMIN  --   --  3.1*    Recent Labs  06/01/16 1247  02/01/17 1321 03/29/17 03/29/17 1340  WBC 5.3  < > 8.2 2.8 2.9*  NEUTROABS 3.4  --  7.6*  --  2.1  HGB 12.9  < > 12.6 9.7* 11.4*  HCT 39.6  < > 36.9 28* 33.9*  MCV 91  --  100  --  109*  PLT 276  < > 275 421* 442*  < > = values in this interval not displayed. Lab Results  Component Value Date   TSH 1.56 05/13/2015   Lab Results  Component Value Date   HGBA1C 5.8 (H) 07/28/2014   No results found for: CHOL, HDL, LDLCALC, LDLDIRECT, TRIG, CHOLHDL  Significant Diagnostic Results in last 30 days:  No results found.  Assessment/Plan  Fatigue Likely multifactorial. Her energy per nursing appears improved compared to 2 weeks back. With her wegener's, anemia of chronic disease and deconditioning. Reviewed her recent lab from oncology office with Hb low but stable at 11.4. No signs of infection on clinical exam and lab review. Will decrease her gabapentin as below and monitor her.   Iron def anemia Elevated ferritin with borderline low iron and low TIBC on lab review. Hb low but stable at 11.4. No active bleed reported. Will await orders from oncology for need for iron infusion. Iron panel is suggestive of anemia of  chronic disease.   Peripheral neuropathy Denies pain this visit. Currently on gabapentin 300 mg tid. Change this to 200 mg tid for now x 1 week and then 100 mg tid and monitor.    Family/ staff Communication: reviewed care plan with patient and her daughter over the phone with charge nurse present. They have been updated about recent cbc result and to await on further recommendation from her oncologist and rheumatologist. Explained to daughter that her u/a from last week has grown >100,000 colonies of mixed flora and that likely is contaminant. Currently pt has no urinary complaints or clinical finding suggestive of infection. Pt and family agree to care plan for now.   Labs/tests ordered:  none  Blanchie Serve, MD Internal Medicine [facility] [address] [place],  [address] Cell Phone (Monday-Friday 8 am - 5 pm): [phone] On Call: [phone] and follow prompts after 5 pm and on weekends Office Phone: [phone] Office Fax: [phone]

## 2017-04-09 LAB — PROTIME-INR: Protime: 17.9 — AB (ref 10.0–13.8)

## 2017-04-09 LAB — POCT INR: INR: 1.7 — AB (ref <=1.1)

## 2017-04-10 ENCOUNTER — Other Ambulatory Visit: Payer: Self-pay | Admitting: *Deleted

## 2017-04-12 LAB — HEPATIC FUNCTION PANEL
ALK PHOS: 94 (ref 25–125)
ALT: 16 (ref 7–35)
AST: 19 (ref 13–35)
Bilirubin, Total: 0.4

## 2017-04-12 LAB — CBC AND DIFFERENTIAL
HCT: 27 — AB (ref 36–46)
HEMOGLOBIN: 9.5 — AB (ref 12.0–16.0)
Platelets: 385 (ref 150–399)
WBC: 3

## 2017-04-12 LAB — BASIC METABOLIC PANEL
BUN: 9 (ref 4–21)
Creatinine: 0.8 (ref 0.5–1.1)
Glucose: 87
POTASSIUM: 4.2 (ref 3.4–5.3)
SODIUM: 139 (ref 137–147)

## 2017-04-26 LAB — HEPATIC FUNCTION PANEL
ALK PHOS: 105 (ref 25–125)
ALT: 16 (ref 7–35)
AST: 21 (ref 13–35)
BILIRUBIN, TOTAL: 0.5

## 2017-04-26 LAB — BASIC METABOLIC PANEL
BUN: 11 (ref 4–21)
Creatinine: 0.9 (ref 0.5–1.1)
Glucose: 94
POTASSIUM: 3.9 (ref 3.4–5.3)
SODIUM: 141 (ref 137–147)

## 2017-04-26 LAB — CBC AND DIFFERENTIAL
HEMATOCRIT: 26 — AB (ref 36–46)
HEMOGLOBIN: 9 — AB (ref 12.0–16.0)
Platelets: 299 (ref 150–399)
WBC: 2.3

## 2017-05-01 ENCOUNTER — Non-Acute Institutional Stay: Payer: Medicare Other | Admitting: Internal Medicine

## 2017-05-01 DIAGNOSIS — R5383 Other fatigue: Secondary | ICD-10-CM | POA: Diagnosis not present

## 2017-05-01 DIAGNOSIS — D638 Anemia in other chronic diseases classified elsewhere: Secondary | ICD-10-CM | POA: Diagnosis not present

## 2017-05-01 NOTE — Progress Notes (Signed)
Location:  [facility] Room Number: [place] of Service:  ALF [phone]) Provider:  Blanchie Serve, MD  Blanchie Serve, MD  [name], MD as PCP - General (Internal Medicine) Unice Bailey, MD as Consulting Physician (Rheumatology) Rozetta Nunnery, MD as Consulting Physician (Otolaryngology) Marcial Pacas, MD as Consulting Physician (Neurology) Michel Bickers, MD as Consulting Physician (Infectious Diseases) Marin Olp Rudell Cobb, MD as Consulting Physician (Oncology) Juanito Doom, MD as Consulting Physician (Pulmonary Disease) Tanda Rockers, MD as Consulting Physician (Pulmonary Disease) Estill Dooms, MD as Consulting Physician (Geriatric Medicine) Coralie Keens, MD as Consulting Physician (General Surgery) Mast, Man X, NP as Nurse Practitioner (Nurse Practitioner)  Extended Emergency Contact Information Primary Emergency Contact: Haygood,Sandra Address: [address] [place] [address] Johnnette Litter of [place] Phone: [phone] Relation: Daughter Secondary Emergency Contact: Borromeo,[name] Address: [address] [place]          [place], [place] [address] [place] Phone: [phone] Mobile Phone: [phone] Relation: Son  Code Status:  Full Code  Goals of care: Advanced Directive information Advanced Directives 04/06/2017  Does Patient Have a Medical Advance Directive? Yes  Type of Paramedic of [place];Living will  Does patient want to make changes to medical advance directive? No - Patient declined  Copy of [place] in Chart? Yes  Would patient like information on creating a medical advance directive? -     Chief Complaint  Patient presents with  . Acute Visit    Follow up on fatigue    HPI:  Pt is a 83 y.o. female seen today for an acute visit for follow up on her fatigue. She is seen in her room today. Her energy  level is fair. She goes to dining area for breakfast and dinner now and occasionally for lunch and this is improvement from last visit where she was eating in her room. Last visit her gabapentin dosing was reduced some. Pt today denies any nerve pain or discomfort. Reviewed her recent lab from 04/26/17 with Hb of 9. Of note, she has wegner's granulomatosis and is on cytoxan.    Past Medical History:  Diagnosis Date  . Acute respiratory failure with hypoxia ([place])   . Anemia   . Aspergillosis ([place])   . Bladder incontinence   . Blindness of one eye   . Cancer ([facility])    melanoma   (chemo)  . Chronic sinusitis   . Clotting disorder ([facility])   . Difficult intubation    Difficult airway with intubation for ARF 07/22/14 (due to anterior larynx, also had mucous plug)  . Fatigue 12/09/2015  . Glaucoma   . Hearing loss in left ear    hearing aid both ears  . History of pulmonary embolism 1997  . Interstitial emphysema ([facility])   . Left shoulder pain 12/09/2015  . Multiple allergies   . Neuropathy   . OA (osteoarthritis)   . Pneumonia 12/15  . Shortness of breath dyspnea   . Stroke [facility])    ??? blind left eye  . Wegener's granulomatosis ([facility])    Past Surgical History:  Procedure Laterality Date  . bronchosocpy     pt. states she has had over 10 in last 20 yrs.  Marland Kitchen CATARACT EXTRACTION    . COCHLEAR IMPLANT Left 10/27/2015   Procedure: LEFT COCHLEAR IMPLANT;  Surgeon: Leta Baptist, MD;  Location: [facility];  Service: ENT;  Laterality: Left;  . INGUINAL HERNIA REPAIR Left 02/17/2014   Procedure: LEFT INGUINAL HERNIA REPAIR WITH MESH;  Surgeon: Harl Bowie, MD;  Location: [facility];  Service: General;  Laterality: Left;  . INSERTION OF MESH N/A 02/17/2014   Procedure: INSERTION OF MESH;  Surgeon: Harl Bowie, MD;  Location: [facility];  Service: General;  Laterality: N/A;  . KNEE ARTHROSCOPY  2006   rt   . MELANOMA EXCISION     left leg  . PUBOVAGINAL SLING    .  TEAR DUCT PROBING    . TUBAL LIGATION    . TYMPANOMASTOIDECTOMY Left 04/28/2015  . TYMPANOMASTOIDECTOMY Left 04/28/2015   Procedure: LEFT TYMPANOMASTOIDECTOMY;  Surgeon: Leta Baptist, MD;  Location: [facility];  Service: ENT;  Laterality: Left;  Marland Kitchen VENA CAVA FILTER PLACEMENT  1997   [place] filter    Allergies  Allergen Reactions  . Adhesive [Tape] Rash    On on lower extremities     Outpatient Encounter Prescriptions as of 05/01/2017  Medication Sig  . acetaminophen (TYLENOL) 650 MG CR tablet Take 650 mg by mouth at bedtime. Can also take 650 mg by mouth 2 times daily as needed  . calcium-vitamin D (OSCAL WITH D) 500-200 MG-UNIT per tablet Take 1 tablet by mouth daily.   . cholecalciferol (VITAMIN D) 1000 UNITS tablet One daily for vitamin D supplement  . cyclophosphamide (CYTOXAN) 25 MG tablet Take 25 mg by mouth daily. Give on an empty stomach 1 hour before or 2 hours after meals. Take along with the 50 mg tablet to make a total of 75 mg  . cyclophosphamide (CYTOXAN) 50 MG tablet Take 50 mg by mouth daily. Give on an empty stomach 1 hour before or 2 hours after meals. Take along with 25 mg tablet to make 75 mg  . diclofenac sodium (VOLTAREN) 1 % GEL Apply 2 g topically 3 (three) times daily. Apply to left shoulder.  . Fluticasone-Salmeterol (ADVAIR DISKUS) 500-50 MCG/DOSE AEPB INHALE 1 PUFF EVERY 12 HOURS.  . folic acid (FOLVITE) 1 MG tablet Take 1 mg by mouth daily.   Marland Kitchen gabapentin (NEURONTIN) 100 MG capsule Take 100 mg by mouth 3 (three) times daily.  Marland Kitchen guaiFENesin (MUCINEX) 600 MG 12 hr tablet Take 600 mg by mouth 2 (two) times daily.  . hydrocortisone cream 1 % Apply 1 application topically 4 (four) times daily as needed for itching (in addition to the scheduled does).  Marland Kitchen latanoprost (XALATAN) 0.005 % ophthalmic solution Place 1 drop into both eyes at bedtime.   . levalbuterol (XOPENEX) 0.63 MG/3ML nebulizer solution Take 0.63 mg by nebulization every 6 (six) hours as needed for wheezing  or shortness of breath.  Marland Kitchen LORazepam (ATIVAN) 0.5 MG tablet Take 1 tablet (0.5 mg total) by mouth at bedtime.  . Multiple Vitamins-Minerals (THERAVIM-M) TABS Take 1 tablet by mouth daily.  . naphazoline-pheniramine (NAPHCON-A) 0.025-0.3 % ophthalmic solution 1 drop. One drop in left eye three times a day as needed for redness  . omeprazole (PRILOSEC) 20 MG capsule Take 20 mg by mouth daily.  Marland Kitchen oxybutynin (DITROPAN) 5 MG tablet One twice daily to help bladder control  . phenol (SORE THROAT SPRAY) 1.4 % LIQD Use as directed 1 spray in the mouth or throat as needed for throat irritation / pain.  Marland Kitchen saccharomyces boulardii (FLORASTOR) 250 MG capsule Take 250 mg by mouth daily.   Marland Kitchen sulfamethoxazole-trimethoprim (BACTRIM DS,SEPTRA DS) 800-160  MG per tablet Take 1 tablet by mouth every Monday, Wednesday, and Friday. HOLD WHILE ON AUGMENTIN, THEN RESUME AS PREVIOUS  . timolol (BETIMOL) 0.5 % ophthalmic solution Place 1 drop into the left eye daily.  Marland Kitchen UNABLE TO FIND Med Name: Duke's magic mouthwash. Swish and spit 5 mL QID PRN  . warfarin (COUMADIN) 3 MG tablet Take 3 mg by mouth daily.  . [DISCONTINUED] gabapentin (NEURONTIN) 300 MG capsule Take 300 mg by mouth 3 (three) times daily.   . [DISCONTINUED] hydrocortisone cream 1 % Apply 1 application topically daily.    No facility-administered encounter medications on file as of 05/01/2017.     Review of Systems  Constitutional: Negative for appetite change, diaphoresis and fever.  HENT: Positive for hearing loss. Negative for rhinorrhea and trouble swallowing.   Respiratory: Negative for cough.        Dyspnea With exertion  Cardiovascular: Negative for chest pain and palpitations.  Gastrointestinal: Negative for abdominal pain, nausea and vomiting.  Genitourinary: Negative for dysuria and pelvic pain.  Musculoskeletal: Positive for gait problem.       Uses a walker to ambulate  Skin: Negative for rash.  Neurological: Negative for dizziness,  syncope and headaches.  Psychiatric/Behavioral: Negative for agitation, behavioral problems and dysphoric mood.    Immunization History  Administered Date(s) Administered  . Influenza Split 03/04/2012, 03/11/2014, 04/01/2015  . Influenza,inj,Quad PF,6+ Mos 03/03/2013  . Influenza-Unspecified 04/01/2015, 04/19/2016, 04/23/2017  . PPD Test 05/25/2015  . Pneumococcal Conjugate-13 07/01/2015  . Pneumococcal Polysaccharide-23 09/02/2015  . Zoster 09/30/1994   Pertinent  Health Maintenance Due  Topic Date Due  . INFLUENZA VACCINE  Completed  . DEXA SCAN  Completed  . PNA vac Low Risk Adult  Completed   Fall Risk  03/20/2017 12/09/2015 09/23/2015 09/09/2015 02/03/2015  Falls in the past year? No No Yes Yes Yes  Number falls in past yr: - - 2 or more 2 or more 2 or more  Comment - - - - -  Injury with Fall? - - No No -   Functional Status Survey:    Vitals:   05/01/17 1049  BP: 118/70  Pulse: 68  Resp: 18  Temp: 98.7 F (37.1 C)  TempSrc: Oral  Weight: 136 lb 6.4 oz (61.9 kg)  Height: 5\' 1"  (1.549 m)   Body mass index is 25.77 kg/m.   Wt Readings from Last 3 Encounters:  05/01/17 136 lb 6.4 oz (61.9 kg)  04/06/17 139 lb 6.4 oz (63.2 kg)  04/03/17 140 lb 3.2 oz (63.6 kg)   Physical Exam  Constitutional: She is oriented to person, place, and time. She appears well-developed and well-nourished. No distress.  HENT:  Head: Normocephalic and atraumatic.  Eyes: Pupils are equal, round, and reactive to light. Conjunctivae and EOM are normal.  Neck: Neck supple.  Cardiovascular: Normal rate and regular rhythm.   Pulmonary/Chest: Effort normal. She has no wheezes. She has no rales.  Poor air movement, no added sounds  Abdominal: Soft. Bowel sounds are normal. There is no tenderness. There is no guarding.  Musculoskeletal: She exhibits edema.  Trace leg edema, can move all 4 extremities, using rolling walker  Lymphadenopathy:    She has no cervical adenopathy.  Neurological: She is  alert and oriented to person, place, and time.  Skin: Skin is warm and dry. There is pallor.  Psychiatric: She has a normal mood and affect.    Labs reviewed:  Recent Labs  03/29/17 1340 04/12/17 04/26/17  NA  137 139 141  K 3.8 4.2 3.9  CL 105  --   --   CO2 27  --   --   GLUCOSE 131*  --   --   BUN 7 9 11   CREATININE 1.1 0.8 0.9  CALCIUM 10.2  --   --     Recent Labs  03/29/17 1340 04/12/17 04/26/17  AST 39* 19 21  ALT 28 16 16   ALKPHOS 120* 94 105  BILITOT 0.50  --   --   PROT 6.7  --   --   ALBUMIN 3.1*  --   --     Recent Labs  06/01/16 1247  02/01/17 1321  03/29/17 1340 04/12/17 04/26/17  WBC 5.3  < > 8.2  < > 2.9* 3.0 2.3  NEUTROABS 3.4  --  7.6*  --  2.1  --   --   HGB 12.9  < > 12.6  < > 11.4* 9.5* 9.0*  HCT 39.6  < > 36.9  < > 33.9* 27* 26*  MCV 91  --  100  --  109*  --   --   PLT 276  < > 275  < > 442* 385 299  < > = values in this interval not displayed. Lab Results  Component Value Date   TSH 1.56 05/13/2015    Assessment/Plan  Fatigue Likely multifactorial. Her energy level has improved some. Reviewed her labs showing high ferritin suggestive of possible anemia of chronic disease. Hb 9.0 at present. Drop in Hb. Has upcoming oncology appointment. Tolerating reduced dosing of gabapentin of 100 mg tid well. Was on 300 mg tid before.   Iron def anemia Recent iron panel s/o anemia of chronic disease. No active bleed reported. Monitor clinically. Has upcoming oncology appointment.    Family/ staff Communication: reviewed care plan with patient and her nurse  Labs/tests ordered:  None for now. Has oncology f/u.   Blanchie Serve, MD Internal Medicine [facility] [address] [place], [place] [address] Cell Phone (Monday-Friday 8 am - 5 pm): [phone] On Call: [phone] and follow prompts after 5 pm and on weekends Office Phone: [phone] Office Fax: [phone]

## 2017-05-09 ENCOUNTER — Ambulatory Visit: Payer: Medicare Other | Admitting: Hematology & Oncology

## 2017-05-09 ENCOUNTER — Other Ambulatory Visit: Payer: Medicare Other

## 2017-05-10 ENCOUNTER — Non-Acute Institutional Stay: Payer: Medicare Other | Admitting: Nurse Practitioner

## 2017-05-10 ENCOUNTER — Other Ambulatory Visit (HOSPITAL_BASED_OUTPATIENT_CLINIC_OR_DEPARTMENT_OTHER): Payer: Medicare Other

## 2017-05-10 ENCOUNTER — Encounter: Payer: Self-pay | Admitting: Nurse Practitioner

## 2017-05-10 ENCOUNTER — Other Ambulatory Visit: Payer: Self-pay

## 2017-05-10 ENCOUNTER — Ambulatory Visit (HOSPITAL_BASED_OUTPATIENT_CLINIC_OR_DEPARTMENT_OTHER): Payer: Medicare Other | Admitting: Family

## 2017-05-10 ENCOUNTER — Encounter: Payer: Self-pay | Admitting: Family

## 2017-05-10 VITALS — BP 109/47 | HR 92 | Temp 98.0°F | Resp 20 | Wt 134.0 lb

## 2017-05-10 DIAGNOSIS — S5012XA Contusion of left forearm, initial encounter: Secondary | ICD-10-CM

## 2017-05-10 DIAGNOSIS — D638 Anemia in other chronic diseases classified elsewhere: Secondary | ICD-10-CM

## 2017-05-10 DIAGNOSIS — D509 Iron deficiency anemia, unspecified: Secondary | ICD-10-CM

## 2017-05-10 DIAGNOSIS — D500 Iron deficiency anemia secondary to blood loss (chronic): Secondary | ICD-10-CM

## 2017-05-10 DIAGNOSIS — M3130 Wegener's granulomatosis without renal involvement: Secondary | ICD-10-CM

## 2017-05-10 LAB — BASIC METABOLIC PANEL
BUN: 12 (ref 4–21)
CREATININE: 1 (ref <=1.1)
Glucose: 83
POTASSIUM: 3.7 (ref 3.4–5.3)
Sodium: 137 (ref 137–147)

## 2017-05-10 LAB — HEPATIC FUNCTION PANEL
ALK PHOS: 124 (ref 25–125)
ALT: 18 (ref 7–35)
AST: 21 (ref 13–35)
Bilirubin, Total: 0.5

## 2017-05-10 LAB — CBC AND DIFFERENTIAL
HCT: 23 — AB (ref 36–46)
Hemoglobin: 8 — AB (ref 12.0–16.0)
PLATELETS: 305 (ref 150–399)
WBC: 2.5

## 2017-05-10 LAB — COMPREHENSIVE METABOLIC PANEL
ALT: 24 U/L (ref 0–55)
AST: 33 U/L (ref 5–34)
Albumin: 3 g/dL — ABNORMAL LOW (ref 3.5–5.0)
Alkaline Phosphatase: 150 U/L (ref 40–150)
Anion Gap: 9 mEq/L (ref 3–11)
BILIRUBIN TOTAL: 0.46 mg/dL (ref 0.20–1.20)
BUN: 12.5 mg/dL (ref 7.0–26.0)
CO2: 22 meq/L (ref 22–29)
Calcium: 10.1 mg/dL (ref 8.4–10.4)
Chloride: 106 mEq/L (ref 98–109)
Creatinine: 1.1 mg/dL (ref 0.6–1.1)
EGFR: 49 mL/min/{1.73_m2} — AB (ref >60)
GLUCOSE: 102 mg/dL (ref 70–140)
POTASSIUM: 3.9 meq/L (ref 3.5–5.1)
SODIUM: 137 meq/L (ref 136–145)
TOTAL PROTEIN: 6.6 g/dL (ref 6.4–8.3)

## 2017-05-10 LAB — CBC WITH DIFFERENTIAL (CANCER CENTER ONLY)
BASO#: 0 10*3/uL (ref 0.0–0.2)
BASO%: 1 % (ref 0.0–2.0)
EOS ABS: 0.1 10*3/uL (ref 0.0–0.5)
EOS%: 1.9 % (ref 0.0–7.0)
HCT: 28 % — ABNORMAL LOW (ref 34.8–46.6)
HGB: 9.5 g/dL — ABNORMAL LOW (ref 11.6–15.9)
LYMPH#: 0.2 10*3/uL — ABNORMAL LOW (ref 0.9–3.3)
LYMPH%: 6.5 % — AB (ref 14.0–48.0)
MCH: 37.4 pg — AB (ref 26.0–34.0)
MCHC: 33.9 g/dL (ref 32.0–36.0)
MCV: 110 fL — ABNORMAL HIGH (ref 81–101)
MONO#: 0.4 10*3/uL (ref 0.1–0.9)
MONO%: 14 % — AB (ref 0.0–13.0)
NEUT#: 2.4 10*3/uL (ref 1.5–6.5)
NEUT%: 76.6 % (ref 39.6–80.0)
PLATELETS: 327 10*3/uL (ref 145–400)
RBC: 2.54 10*6/uL — AB (ref 3.70–5.32)
RDW: 17.5 % — ABNORMAL HIGH (ref 11.1–15.7)
WBC: 3.1 10*3/uL — AB (ref 3.9–10.0)

## 2017-05-10 LAB — RETICULOCYTES: RETICULOCYTE COUNT: 1.7 % (ref 0.6–2.6)

## 2017-05-10 NOTE — Progress Notes (Signed)
Location:  [facility] Room Number: [place] of Service:  ALF [phone]) Provider:  Demetrice Combes, [place]  NP  Blanchie Serve, MD  [name], MD as PCP - General (Internal Medicine) Unice Bailey, MD as Consulting Physician (Rheumatology) Rozetta Nunnery, MD as Consulting Physician (Otolaryngology) Marcial Pacas, MD as Consulting Physician (Neurology) Michel Bickers, MD as Consulting Physician (Infectious Diseases) Marin Olp Rudell Cobb, MD as Consulting Physician (Oncology) Juanito Doom, MD as Consulting Physician (Pulmonary Disease) Tanda Rockers, MD as Consulting Physician (Pulmonary Disease) Estill Dooms, MD as Consulting Physician (Geriatric Medicine) Coralie Keens, MD as Consulting Physician (General Surgery) Jamelia Varano X, NP as Nurse Practitioner (Nurse Practitioner)  Extended Emergency Contact Information Primary Emergency Contact: Haygood,Sandra Address: [address] [place] [address] Johnnette Litter of [place] Phone: [phone] Relation: Daughter Secondary Emergency Contact: Debord,[name] Address: [address] [place]          [place],  [address] [place] Phone: [phone] Mobile Phone: [phone] Relation: Son   Code Status:  Full Code  Goals of care: Advanced Directive information Advanced Directives 05/10/2017  Does Patient Have a Medical Advance Directive? Yes  Type of Paramedic of [place];Living will  Does patient want to make changes to medical advance directive? -  Copy of [place] in Chart? -  Would patient like information on creating a medical advance directive? -     Chief Complaint  Patient presents with  . Acute Visit    fell with large skin tear    HPI:  Pt is a 83 y.o. female seen today for an acute visit for 05/10/17 fell, lost balance due to large shoes and arm hit wall resulted in skin tear  left forearm about 5cm in length, approximated with steri strips.   Also staff reported the patient has on and off low grade T, cough, sputum production, not feeling well, 05/10/17 wbc 2.5, Hgb 8.0, plt 305, Na 137, K 3.7, Bun 12, creat 0.99. Received IV Iron at Oncology. She has Hx of Wegener's granulomatosis, has been evaluated and treated with Cytoxan by Rheumatology  Past Medical History:  Diagnosis Date  . Acute respiratory failure with hypoxia ([place])   . Anemia   . Aspergillosis ([place])   . Bladder incontinence   . Blindness of one eye   . Cancer ([place])    melanoma   (chemo)  . Chronic sinusitis   . Clotting disorder ([place])   . Difficult intubation    Difficult airway with intubation for ARF 07/22/14 (due to anterior larynx, also had mucous plug)  . Fatigue 12/09/2015  . Glaucoma   . Hearing loss in left ear    hearing aid both ears  . History of pulmonary embolism 1997  . Interstitial emphysema ([place])   . Left shoulder pain 12/09/2015  . Multiple allergies   . Neuropathy   . OA (osteoarthritis)   . Pneumonia 12/15  . Shortness of breath dyspnea   . Stroke [facility])    ??? blind left eye  . Wegener's granulomatosis ([place])    Past Surgical History:  Procedure Laterality Date  . bronchosocpy     pt. states she has had over 10 in last 20 yrs.  Marland Kitchen CATARACT EXTRACTION    . KNEE ARTHROSCOPY  2006   rt   . MELANOMA EXCISION     left leg  .  PUBOVAGINAL SLING    . TEAR DUCT PROBING    . TUBAL LIGATION    . TYMPANOMASTOIDECTOMY Left 04/28/2015  . VENA CAVA FILTER PLACEMENT  1997   [place] filter    Allergies  Allergen Reactions  . Adhesive [Tape] Rash    On on lower extremities     Outpatient Encounter Medications as of 05/10/2017  Medication Sig  . acetaminophen (TYLENOL) 650 MG CR tablet Take 650 mg by mouth at bedtime. Can also take 650 mg by mouth 2 times daily as needed  . calcium-vitamin D (OSCAL WITH D) 500-200 MG-UNIT per tablet Take 1 tablet by mouth daily.   .  cholecalciferol (VITAMIN D) 1000 UNITS tablet One daily for vitamin D supplement  . cyclophosphamide (CYTOXAN) 25 MG tablet Take 25 mg by mouth daily. Give on an empty stomach 1 hour before or 2 hours after meals. Take along with the 50 mg tablet to make a total of 75 mg  . cyclophosphamide (CYTOXAN) 50 MG tablet Take 50 mg by mouth daily. Give on an empty stomach 1 hour before or 2 hours after meals. Take along with 25 mg tablet to make 75 mg  . diclofenac sodium (VOLTAREN) 1 % GEL Apply 2 g topically 3 (three) times daily. Apply to left shoulder.  . Fluticasone-Salmeterol (ADVAIR DISKUS) 500-50 MCG/DOSE AEPB INHALE 1 PUFF EVERY 12 HOURS.  . folic acid (FOLVITE) 1 MG tablet Take 1 mg by mouth daily.   Marland Kitchen gabapentin (NEURONTIN) 100 MG capsule Take 100 mg by mouth 3 (three) times daily.  Marland Kitchen guaiFENesin (MUCINEX) 600 MG 12 hr tablet Take 600 mg by mouth 2 (two) times daily.  . hydrocortisone cream 1 % Apply 1 application topically 4 (four) times daily as needed for itching (in addition to the scheduled does).  Marland Kitchen latanoprost (XALATAN) 0.005 % ophthalmic solution Place 1 drop into both eyes at bedtime.   . levalbuterol (XOPENEX) 0.63 MG/3ML nebulizer solution Take 0.63 mg by nebulization every 6 (six) hours as needed for wheezing or shortness of breath.  Marland Kitchen LORazepam (ATIVAN) 0.5 MG tablet Take 1 tablet (0.5 mg total) by mouth at bedtime.  . Multiple Vitamins-Minerals (THERAVIM-M) TABS Take 1 tablet by mouth daily.  . naphazoline-pheniramine (NAPHCON-A) 0.025-0.3 % ophthalmic solution 1 drop. One drop in left eye three times a day as needed for redness  . omeprazole (PRILOSEC) 20 MG capsule Take 20 mg by mouth daily.  Marland Kitchen oxybutynin (DITROPAN) 5 MG tablet One twice daily to help bladder control  . phenol (SORE THROAT SPRAY) 1.4 % LIQD Use as directed 1 spray in the mouth or throat as needed for throat irritation / pain.  Marland Kitchen saccharomyces boulardii (FLORASTOR) 250 MG capsule Take 250 mg by mouth daily.   Marland Kitchen  sulfamethoxazole-trimethoprim (BACTRIM DS,SEPTRA DS) 800-160 MG per tablet Take 1 tablet by mouth every Monday, Wednesday, and Friday. HOLD WHILE ON AUGMENTIN, THEN RESUME AS PREVIOUS  . timolol (BETIMOL) 0.5 % ophthalmic solution Place 1 drop into the left eye daily.  Marland Kitchen UNABLE TO FIND Med Name: Duke's magic mouthwash. Swish and spit 5 mL QID PRN  . warfarin (COUMADIN) 3 MG tablet Take 3 mg by mouth daily.   No facility-administered encounter medications on file as of 05/10/2017.     Review of Systems  Constitutional: Positive for fatigue and fever. Negative for activity change, appetite change, chills and diaphoresis.  HENT: Positive for hearing loss. Negative for congestion, sore throat, trouble swallowing and voice change.   Respiratory: Positive  for cough, shortness of breath and wheezing. Negative for choking and chest tightness.   Cardiovascular: Positive for leg swelling. Negative for chest pain and palpitations.       Trace edema in ankles.   Genitourinary: Negative for difficulty urinating and dysuria.  Musculoskeletal: Positive for gait problem. Negative for arthralgias, back pain, joint swelling, myalgias, neck pain and neck stiffness.       [place], lost balance due to large shoes and arm hit wall resulted in skin tear left forearm  Skin: Positive for wound. Negative for color change, pallor and rash.       Sustained left elbow contusion and skin tear about 5cm in length from falling.   Neurological: Negative for dizziness, tremors, speech difficulty, weakness and headaches.  Psychiatric/Behavioral: Negative for agitation and behavioral problems. The patient is not nervous/anxious.     Immunization History  Administered Date(s) Administered  . Influenza Split 03/04/2012, 03/11/2014, 04/01/2015  . Influenza,inj,Quad PF,6+ Mos 03/03/2013  . Influenza-Unspecified 04/01/2015, 04/19/2016, 04/23/2017  . PPD Test 05/25/2015  . Pneumococcal Conjugate-13 07/01/2015  . Pneumococcal  Polysaccharide-23 09/02/2015  . Zoster 09/30/1994   Pertinent  Health Maintenance Due  Topic Date Due  . INFLUENZA VACCINE  Completed  . DEXA SCAN  Completed  . PNA vac Low Risk Adult  Completed   Fall Risk  03/20/2017 12/09/2015 09/23/2015 09/09/2015 02/03/2015  Falls in the past year? No No Yes Yes Yes  Number falls in past yr: - - 2 or more 2 or more 2 or more  Comment - - - - -  Injury with Fall? - - No No -   Functional Status Survey:    Vitals:   05/10/17 1509  BP: (!) 146/80  Pulse: 78  Resp: (!) 22  Temp: 99.3 F (37.4 C)  Weight: 136 lb (61.7 kg)  Height: 5\' 1"  (1.549 m)   Body mass index is 25.7 kg/m. Physical Exam  Constitutional: She is oriented to person, place, and time. She appears well-developed and well-nourished. No distress.  HENT:  Head: Normocephalic and atraumatic.  Neck: Normal range of motion. Neck supple. No JVD present. No thyromegaly present.  Cardiovascular: Normal rate, regular rhythm and normal heart sounds.  No murmur heard. Pulmonary/Chest: She has no wheezes. She has rales.  DOE, rales posterior lower lungs  Musculoskeletal: Normal range of motion. She exhibits edema. She exhibits no tenderness.  Trace edema in ankles  Neurological: She is alert and oriented to person, place, and time. She exhibits normal muscle tone. Coordination normal.  Skin: Skin is warm and dry. No rash noted. She is not diaphoretic. No erythema. No pallor.  [place], lost balance due to large shoes and arm hit wall resulted in skin tear left forearm about 5cm in length, approximated with steri strips.       Labs reviewed: Recent Labs    03/29/17 1340  04/26/17 05/10/17 05/10/17 1142  NA 137   < > 141 137 137  K 3.8   < > 3.9 3.7 3.9  CL 105  --   --   --   --   CO2 27  --   --   --  22  GLUCOSE 131*  --   --   --  102  BUN 7   < > 11 12 12.5  CREATININE 1.1   < > 0.9 1.0 1.1  CALCIUM 10.2  --   --   --  10.1   < > = values in this interval  not displayed.    Recent Labs    03/29/17 1340  04/26/17 05/10/17 05/10/17 1142  AST 39*   < > 21 21 33  ALT 28   < > 16 18 24   ALKPHOS 120*   < > 105 124 150  BILITOT 0.50  --   --   --  0.46  PROT 6.7  --   --   --  6.6  ALBUMIN 3.1*  --   --   --  3.0*   < > = values in this interval not displayed.   Recent Labs    02/01/17 1321  03/29/17 1340  04/26/17 05/10/17 05/10/17 1142  WBC 8.2   < > 2.9*   < > 2.3 2.5 3.1*  NEUTROABS 7.6*  --  2.1  --   --   --  2.4  HGB 12.6   < > 11.4*   < > 9.0* 8.0* 9.5*  HCT 36.9   < > 33.9*   < > 26* 23* 28.0*  MCV 100  --  109*  --   --   --  110*  PLT 275   < > 442*   < > 299 305 327   < > = values in this interval not displayed.   Lab Results  Component Value Date   TSH 1.56 05/13/2015   Lab Results  Component Value Date   HGBA1C 5.8 (H) 07/28/2014   No results found for: CHOL, HDL, LDLCALC, LDLDIRECT, TRIG, CHOLHDL  Significant Diagnostic Results in last 30 days:  No results found.  Assessment/Plan Contusion of left elbow and forearm 05/10/17 fell, lost balance due to large shoes and arm hit wall resulted in skin tear left forearm about 5cm in length, approximated with steri strips. The patient is instructed to wear a lace up shoes instead of sliders   WEGENERS GRANULOMATOSIS the patient has on and off low grade T, cough, sputum production, not feeling well, 05/10/17 wbc 2.5, Hgb 8.0, plt 305, Na 137, K 3.7, Bun 12, creat 0.99. Received IV Iron at Oncology. She has Hx of Wegener's granulomatosis, has been evaluated and treated with Cytoxan by Rheumatology. Obtain CXR ap and lateral views, schedule Xopenex Neb q6h x 3 days in addition to q6h prn. May consider antibiotics is CXR positive for PNA or fever persists.   Anemia of chronic disease the patient has on and off low grade T, cough, sputum production, not feeling well, 05/10/17 wbc 2.5, Hgb 8.0, plt 305, Na 137, K 3.7, Bun 12, creat 0.99. Received IV Iron at Oncology.       Family/ staff  Communication: plan of care reviewed with the patient and charge nurse. F/u Rheumatology  Labs/tests ordered:  CXR ap and lateral viewes  Time spend 25 minutes.

## 2017-05-10 NOTE — Progress Notes (Signed)
Hematology and Oncology Follow Up Visit  [name] [id number] 01-22-1933 83 y.o. 05/10/2017   Principle Diagnosis:  Recurrent iron deficiency anemia Wegener's granulomatosis  Current Therapy:   IV iron as indicated - last received in August 2018   Interim History:  [name] is here today with her caregiver for follow-up. She is feeling fatigued and weak. She states that the Cytoxan makes her tired.  Her hgb is down at 9.5. Iron studies are pending.  No episodes of bleeding, bruising or petechiae. No lymphadenopathy found on exam.  She is not walking much at home. She is using a lift chair and when she does ambulate she uses a cane.  She denies fever, chills, n/v, rash, dizziness, SOB, chest pain, palpitations, abdominal pain or changes in bowel or bladder habits.  No swelling, numbness or tingling in her extremities. She has tenderness in the left shoulder that is unchanged.  She states that her appetite is ok and she is staying hydrated. Her weight is down 9 lbs since we last saw her in September.   ECOG Performance Status: 1 - Symptomatic but completely ambulatory  Medications:  Allergies as of 05/10/2017      Reactions   Adhesive [tape] Rash   On on lower extremities       Medication List        Accurate as of 05/10/17 12:03 PM. Always use your most recent med list.          acetaminophen 650 MG CR tablet Commonly known as:  TYLENOL Take 650 mg by mouth at bedtime. Can also take 650 mg by mouth 2 times daily as needed   calcium-vitamin D 500-200 MG-UNIT tablet Commonly known as:  OSCAL WITH D Take 1 tablet by mouth daily.   cholecalciferol 1000 units tablet Commonly known as:  VITAMIN D One daily for vitamin D supplement   cyclophosphamide 50 MG tablet Commonly known as:  CYTOXAN Take 50 mg by mouth daily. Give on an empty stomach 1 hour before or 2 hours after meals. Take along with 25 mg tablet to make 75 mg   cyclophosphamide 25 MG tablet Commonly  known as:  CYTOXAN Take 25 mg by mouth daily. Give on an empty stomach 1 hour before or 2 hours after meals. Take along with the 50 mg tablet to make a total of 75 mg   diclofenac sodium 1 % Gel Commonly known as:  VOLTAREN Apply 2 g topically 3 (three) times daily. Apply to left shoulder.   Fluticasone-Salmeterol 500-50 MCG/DOSE Aepb Commonly known as:  ADVAIR DISKUS INHALE 1 PUFF EVERY 12 HOURS.   folic acid 1 MG tablet Commonly known as:  FOLVITE Take 1 mg by mouth daily.   gabapentin 100 MG capsule Commonly known as:  NEURONTIN Take 100 mg by mouth 3 (three) times daily.   guaiFENesin 600 MG 12 hr tablet Commonly known as:  MUCINEX Take 600 mg by mouth 2 (two) times daily.   hydrocortisone cream 1 % Apply 1 application topically 4 (four) times daily as needed for itching (in addition to the scheduled does).   latanoprost 0.005 % ophthalmic solution Commonly known as:  XALATAN Place 1 drop into both eyes at bedtime.   levalbuterol 0.63 MG/3ML nebulizer solution Commonly known as:  XOPENEX Take 0.63 mg by nebulization every 6 (six) hours as needed for wheezing or shortness of breath.   LORazepam 0.5 MG tablet Commonly known as:  ATIVAN Take 1 tablet (0.5 mg total) by  mouth at bedtime.   NAPHCON-A 0.025-0.3 % ophthalmic solution Generic drug:  naphazoline-pheniramine 1 drop. One drop in left eye three times a day as needed for redness   omeprazole 20 MG capsule Commonly known as:  PRILOSEC Take 20 mg by mouth daily.   oxybutynin 5 MG tablet Commonly known as:  DITROPAN One twice daily to help bladder control   saccharomyces boulardii 250 MG capsule Commonly known as:  FLORASTOR Take 250 mg by mouth daily.   SORE THROAT SPRAY 1.4 % Liqd Generic drug:  phenol Use as directed 1 spray in the mouth or throat as needed for throat irritation / pain.   sulfamethoxazole-trimethoprim 800-160 MG tablet Commonly known as:  BACTRIM DS,SEPTRA DS Take 1 tablet by mouth  every Monday, Wednesday, and Friday. HOLD WHILE ON AUGMENTIN, THEN RESUME AS PREVIOUS   THERAVIM-M Tabs Take 1 tablet by mouth daily.   timolol 0.5 % ophthalmic solution Commonly known as:  BETIMOL Place 1 drop into the left eye daily.   UNABLE TO FIND Med Name: Duke's magic mouthwash. Swish and spit 5 mL QID PRN   warfarin 3 MG tablet Commonly known as:  COUMADIN Take 3 mg by mouth daily.       Allergies:  Allergies  Allergen Reactions  . Adhesive [Tape] Rash    On on lower extremities     Past Medical History, Surgical history, Social history, and Family History were reviewed and updated.  Review of Systems: All other 10 point review of systems is negative.   Physical Exam:  vitals were not taken for this visit.   Wt Readings from Last 3 Encounters:  05/01/17 136 lb 6.4 oz (61.9 kg)  04/06/17 139 lb 6.4 oz (63.2 kg)  04/03/17 140 lb 3.2 oz (63.6 kg)    Ocular: Sclerae unicteric, pupils equal, round and reactive to light Ear-nose-throat: Oropharynx clear, dentition fair Lymphatic: No cervical, supraclavicular or axillary adenopathy Lungs no rales or rhonchi, good excursion bilaterally Heart regular rate and rhythm, no murmur appreciated Abd soft, nontender, positive bowel sounds, no liver or spleen tip palpated on exam, no fluid wave MSK no focal spinal tenderness, no joint edema Neuro: non-focal, well-oriented, appropriate affect Breasts: Deferred   Lab Results  Component Value Date   WBC 3.1 (L) 05/10/2017   HGB 9.5 (L) 05/10/2017   HCT 28.0 (L) 05/10/2017   MCV 110 (H) 05/10/2017   PLT 327 05/10/2017   Lab Results  Component Value Date   FERRITIN 580 (H) 03/29/2017   IRON 40 (L) 03/29/2017   TIBC 188 (L) 03/29/2017   UIBC 148 03/29/2017   IRONPCTSAT 21 03/29/2017   Lab Results  Component Value Date   RETICCTPCT 1.6 02/03/2015   RBC 2.54 (L) 05/10/2017   RETICCTABS 62.4 02/03/2015   No results found for: KPAFRELGTCHN, LAMBDASER,  KAPLAMBRATIO No results found for: IGGSERUM, IGA, IGMSERUM No results found for: Kathrynn Ducking, MSPIKE, SPEI   Chemistry      Component Value Date/Time   NA 141 04/26/2017   NA 137 03/29/2017 1340   K 3.9 04/26/2017   K 3.8 03/29/2017 1340   CL 105 03/29/2017 1340   CO2 27 03/29/2017 1340   BUN 11 04/26/2017   BUN 7 03/29/2017 1340   CREATININE 0.9 04/26/2017   CREATININE 1.1 03/29/2017 1340   GLU 94 04/26/2017      Component Value Date/Time   CALCIUM 10.2 03/29/2017 1340   ALKPHOS 105 04/26/2017   ALKPHOS  120 (H) 03/29/2017 1340   AST 21 04/26/2017   AST 39 (H) 03/29/2017 1340   ALT 16 04/26/2017   ALT 28 03/29/2017 1340   BILITOT 0.50 03/29/2017 1340      Impression and Plan: [name] is a very pleasant 83 yo female with both Wegener's granulomatosis and iron deficeincy anemia.  Her Cytoxan is managed by Dr. Veneta Penton at [facility].  She is symptomatic with fatigue and weakness at this time. We will see what her iron studies show and bring her back in next week for an infusion if needed.  We will plan to see her back again in another 6 weeks for repeat lab work and follow-up.  Her living facility will contact our office with any questions or concerns. We can certainly see her sooner if need be.   Eliezer Bottom, NP 11/8/201812:03 PM

## 2017-05-11 ENCOUNTER — Other Ambulatory Visit: Payer: Self-pay | Admitting: *Deleted

## 2017-05-11 ENCOUNTER — Inpatient Hospital Stay (HOSPITAL_COMMUNITY)
Admission: EM | Admit: 2017-05-11 | Discharge: 2017-05-15 | DRG: 871 | Disposition: A | Discharge status: Skilled Nursing Facility | Payer: Medicare Other | Attending: Internal Medicine | Admitting: Internal Medicine

## 2017-05-11 ENCOUNTER — Emergency Department (HOSPITAL_COMMUNITY): Payer: Medicare Other

## 2017-05-11 ENCOUNTER — Ambulatory Visit: Payer: Medicare Other | Admitting: Hematology & Oncology

## 2017-05-11 ENCOUNTER — Other Ambulatory Visit: Payer: Medicare Other

## 2017-05-11 ENCOUNTER — Encounter (HOSPITAL_COMMUNITY): Payer: Self-pay | Admitting: Internal Medicine

## 2017-05-11 DIAGNOSIS — I82511 Chronic embolism and thrombosis of right femoral vein: Secondary | ICD-10-CM | POA: Diagnosis present

## 2017-05-11 DIAGNOSIS — M3130 Wegener's granulomatosis without renal involvement: Secondary | ICD-10-CM | POA: Diagnosis present

## 2017-05-11 DIAGNOSIS — Z85820 Personal history of malignant melanoma of skin: Secondary | ICD-10-CM | POA: Diagnosis not present

## 2017-05-11 DIAGNOSIS — Z7901 Long term (current) use of anticoagulants: Secondary | ICD-10-CM

## 2017-05-11 DIAGNOSIS — J982 Interstitial emphysema: Secondary | ICD-10-CM | POA: Diagnosis present

## 2017-05-11 DIAGNOSIS — Z86711 Personal history of pulmonary embolism: Secondary | ICD-10-CM | POA: Diagnosis not present

## 2017-05-11 DIAGNOSIS — H409 Unspecified glaucoma: Secondary | ICD-10-CM | POA: Diagnosis present

## 2017-05-11 DIAGNOSIS — B9562 Methicillin resistant Staphylococcus aureus infection as the cause of diseases classified elsewhere: Secondary | ICD-10-CM | POA: Diagnosis present

## 2017-05-11 DIAGNOSIS — R509 Fever, unspecified: Secondary | ICD-10-CM | POA: Diagnosis not present

## 2017-05-11 DIAGNOSIS — S5012XA Contusion of left forearm, initial encounter: Secondary | ICD-10-CM | POA: Insufficient documentation

## 2017-05-11 DIAGNOSIS — G629 Polyneuropathy, unspecified: Secondary | ICD-10-CM | POA: Diagnosis present

## 2017-05-11 DIAGNOSIS — J189 Pneumonia, unspecified organism: Secondary | ICD-10-CM

## 2017-05-11 DIAGNOSIS — H5440 Blindness, one eye, unspecified eye: Secondary | ICD-10-CM | POA: Diagnosis present

## 2017-05-11 DIAGNOSIS — Z8673 Personal history of transient ischemic attack (TIA), and cerebral infarction without residual deficits: Secondary | ICD-10-CM

## 2017-05-11 DIAGNOSIS — H9192 Unspecified hearing loss, left ear: Secondary | ICD-10-CM | POA: Diagnosis present

## 2017-05-11 DIAGNOSIS — D638 Anemia in other chronic diseases classified elsewhere: Secondary | ICD-10-CM | POA: Diagnosis present

## 2017-05-11 DIAGNOSIS — Z823 Family history of stroke: Secondary | ICD-10-CM | POA: Diagnosis not present

## 2017-05-11 DIAGNOSIS — J45909 Unspecified asthma, uncomplicated: Secondary | ICD-10-CM | POA: Diagnosis present

## 2017-05-11 DIAGNOSIS — A419 Sepsis, unspecified organism: Principal | ICD-10-CM | POA: Diagnosis present

## 2017-05-11 DIAGNOSIS — M1990 Unspecified osteoarthritis, unspecified site: Secondary | ICD-10-CM | POA: Diagnosis present

## 2017-05-11 DIAGNOSIS — J181 Lobar pneumonia, unspecified organism: Secondary | ICD-10-CM | POA: Diagnosis present

## 2017-05-11 DIAGNOSIS — Z87891 Personal history of nicotine dependence: Secondary | ICD-10-CM | POA: Diagnosis not present

## 2017-05-11 DIAGNOSIS — D649 Anemia, unspecified: Secondary | ICD-10-CM | POA: Diagnosis present

## 2017-05-11 DIAGNOSIS — I2782 Chronic pulmonary embolism: Secondary | ICD-10-CM | POA: Diagnosis not present

## 2017-05-11 DIAGNOSIS — M79609 Pain in unspecified limb: Secondary | ICD-10-CM | POA: Diagnosis not present

## 2017-05-11 DIAGNOSIS — I2699 Other pulmonary embolism without acute cor pulmonale: Secondary | ICD-10-CM | POA: Diagnosis present

## 2017-05-11 DIAGNOSIS — Z09 Encounter for follow-up examination after completed treatment for conditions other than malignant neoplasm: Secondary | ICD-10-CM

## 2017-05-11 DIAGNOSIS — M7989 Other specified soft tissue disorders: Secondary | ICD-10-CM | POA: Diagnosis not present

## 2017-05-11 LAB — CBC WITH DIFFERENTIAL/PLATELET
BASOS PCT: 1 %
Basophils Absolute: 0 10*3/uL (ref 0.0–0.1)
EOS ABS: 0 10*3/uL (ref 0.0–0.7)
EOS PCT: 1 %
HCT: 24.5 % — ABNORMAL LOW (ref 36.0–46.0)
HEMOGLOBIN: 8.5 g/dL — AB (ref 12.0–15.0)
LYMPHS ABS: 0.3 10*3/uL — AB (ref 0.7–4.0)
Lymphocytes Relative: 6 %
MCH: 36.6 pg — AB (ref 26.0–34.0)
MCHC: 34.7 g/dL (ref 30.0–36.0)
MCV: 105.6 fL — ABNORMAL HIGH (ref 78.0–100.0)
Monocytes Absolute: 0.4 10*3/uL (ref 0.1–1.0)
Monocytes Relative: 10 %
NEUTROS PCT: 82 %
Neutro Abs: 3.6 10*3/uL (ref 1.7–7.7)
PLATELETS: 327 10*3/uL (ref 150–400)
RBC: 2.32 MIL/uL — AB (ref 3.87–5.11)
RDW: 18.1 % — ABNORMAL HIGH (ref 11.5–15.5)
WBC: 4.4 10*3/uL (ref 4.0–10.5)

## 2017-05-11 LAB — COMPREHENSIVE METABOLIC PANEL
ALK PHOS: 153 U/L — AB (ref 38–126)
ALT: 30 U/L (ref 14–54)
AST: 42 U/L — AB (ref 15–41)
Albumin: 3.1 g/dL — ABNORMAL LOW (ref 3.5–5.0)
Anion gap: 9 (ref 5–15)
BILIRUBIN TOTAL: 0.8 mg/dL (ref 0.3–1.2)
BUN: 14 mg/dL (ref 6–20)
CALCIUM: 9.3 mg/dL (ref 8.9–10.3)
CHLORIDE: 105 mmol/L (ref 101–111)
CO2: 20 mmol/L — ABNORMAL LOW (ref 22–32)
CREATININE: 0.94 mg/dL (ref 0.44–1.00)
GFR, EST NON AFRICAN AMERICAN: 55 mL/min — AB (ref >60)
Glucose, Bld: 100 mg/dL — ABNORMAL HIGH (ref 65–99)
Potassium: 3.9 mmol/L (ref 3.5–5.1)
Sodium: 134 mmol/L — ABNORMAL LOW (ref 135–145)
TOTAL PROTEIN: 5.9 g/dL — AB (ref 6.5–8.1)

## 2017-05-11 LAB — FERRITIN: FERRITIN: 704 ng/mL — AB (ref 9–269)

## 2017-05-11 LAB — IRON AND TIBC
%SAT: 29 % (ref 21–57)
Iron: 48 ug/dL (ref 41–142)
TIBC: 165 ug/dL — ABNORMAL LOW (ref 236–444)
UIBC: 117 ug/dL — ABNORMAL LOW (ref 120–384)

## 2017-05-11 LAB — PROCALCITONIN

## 2017-05-11 LAB — I-STAT CG4 LACTIC ACID, ED: LACTIC ACID, VENOUS: 0.83 mmol/L (ref 0.5–1.9)

## 2017-05-11 MED ORDER — DEXTROSE 5 % IV SOLN
1.0000 g | INTRAVENOUS | Status: DC
  Administered 2017-05-12 – 2017-05-14 (×3): 1 g via INTRAVENOUS
  Filled 2017-05-11 (×4): qty 10

## 2017-05-11 MED ORDER — ACETAMINOPHEN 325 MG PO TABS
650.0000 mg | ORAL_TABLET | Freq: Four times a day (QID) | ORAL | Status: DC | PRN
  Administered 2017-05-12 – 2017-05-15 (×5): 650 mg via ORAL
  Filled 2017-05-11 (×5): qty 2

## 2017-05-11 MED ORDER — FOLIC ACID 1 MG PO TABS
1.0000 mg | ORAL_TABLET | Freq: Every day | ORAL | Status: DC
  Administered 2017-05-12 – 2017-05-15 (×4): 1 mg via ORAL
  Filled 2017-05-11 (×4): qty 1

## 2017-05-11 MED ORDER — ONDANSETRON HCL 4 MG PO TABS: 4.0000 mg | ORAL_TABLET | Freq: Four times a day (QID) | ORAL | Status: DC | PRN

## 2017-05-11 MED ORDER — LEVALBUTEROL HCL 0.63 MG/3ML IN NEBU: 0.6300 mg | INHALATION_SOLUTION | Freq: Four times a day (QID) | RESPIRATORY_TRACT | Status: DC | PRN

## 2017-05-11 MED ORDER — CYCLOPHOSPHAMIDE 25 MG PO TABS: 25.0000 mg | ORAL_TABLET | Freq: Every day | ORAL | Status: DC

## 2017-05-11 MED ORDER — DEXTROSE 5 % IV SOLN
500.0000 mg | Freq: Once | INTRAVENOUS | Status: AC
  Administered 2017-05-11: 500 mg via INTRAVENOUS
  Filled 2017-05-11: qty 500

## 2017-05-11 MED ORDER — GUAIFENESIN ER 600 MG PO TB12
600.0000 mg | ORAL_TABLET | Freq: Two times a day (BID) | ORAL | Status: DC
  Administered 2017-05-11 – 2017-05-14 (×6): 600 mg via ORAL
  Filled 2017-05-11 (×6): qty 1

## 2017-05-11 MED ORDER — DEXTROSE 5 % IV SOLN
500.0000 mg | INTRAVENOUS | Status: DC
  Administered 2017-05-12 – 2017-05-14 (×3): 500 mg via INTRAVENOUS
  Filled 2017-05-11 (×4): qty 500

## 2017-05-11 MED ORDER — ACETAMINOPHEN 650 MG RE SUPP: 650.0000 mg | Freq: Four times a day (QID) | RECTAL | Status: DC | PRN

## 2017-05-11 MED ORDER — MOMETASONE FURO-FORMOTEROL FUM 200-5 MCG/ACT IN AERO
2.0000 | INHALATION_SPRAY | Freq: Two times a day (BID) | RESPIRATORY_TRACT | Status: DC
  Administered 2017-05-12 – 2017-05-15 (×7): 2 via RESPIRATORY_TRACT
  Filled 2017-05-11: qty 8.8

## 2017-05-11 MED ORDER — ONDANSETRON HCL 4 MG/2ML IJ SOLN: 4.0000 mg | Freq: Four times a day (QID) | INTRAMUSCULAR | Status: DC | PRN

## 2017-05-11 MED ORDER — CYCLOPHOSPHAMIDE 50 MG PO TABS: 50.0000 mg | ORAL_TABLET | Freq: Every day | ORAL | Status: DC

## 2017-05-11 MED ORDER — SODIUM CHLORIDE 0.9 % IV BOLUS (SEPSIS)
1000.0000 mL | Freq: Once | INTRAVENOUS | Status: AC
  Administered 2017-05-11: 1000 mL via INTRAVENOUS

## 2017-05-11 MED ORDER — SODIUM CHLORIDE 0.9 % IV SOLN
INTRAVENOUS | Status: AC
  Administered 2017-05-11: via INTRAVENOUS
  Administered 2017-05-12: 1000 mL via INTRAVENOUS
  Administered 2017-05-12: 07:00:00 via INTRAVENOUS

## 2017-05-11 MED ORDER — GABAPENTIN 100 MG PO CAPS
100.0000 mg | ORAL_CAPSULE | Freq: Three times a day (TID) | ORAL | Status: DC
  Administered 2017-05-11 – 2017-05-15 (×11): 100 mg via ORAL
  Filled 2017-05-11 (×11): qty 1

## 2017-05-11 MED ORDER — DEXTROSE 5 % IV SOLN
1.0000 g | Freq: Once | INTRAVENOUS | Status: AC
  Administered 2017-05-11: 1 g via INTRAVENOUS
  Filled 2017-05-11: qty 10

## 2017-05-11 MED ORDER — LORAZEPAM 0.5 MG PO TABS
0.5000 mg | ORAL_TABLET | Freq: Every day | ORAL | Status: DC
  Administered 2017-05-11 – 2017-05-14 (×4): 0.5 mg via ORAL
  Filled 2017-05-11 (×4): qty 1

## 2017-05-11 MED ORDER — OXYBUTYNIN CHLORIDE 5 MG PO TABS
5.0000 mg | ORAL_TABLET | Freq: Two times a day (BID) | ORAL | Status: DC
  Administered 2017-05-12 – 2017-05-15 (×7): 5 mg via ORAL
  Filled 2017-05-11 (×7): qty 1

## 2017-05-11 MED ORDER — ACETAMINOPHEN 500 MG PO TABS
1000.0000 mg | ORAL_TABLET | Freq: Once | ORAL | Status: AC
  Administered 2017-05-11: 1000 mg via ORAL
  Filled 2017-05-11: qty 2

## 2017-05-11 MED ORDER — TIMOLOL MALEATE 0.5 % OP SOLN
1.0000 [drp] | Freq: Every day | OPHTHALMIC | Status: DC
  Administered 2017-05-11 – 2017-05-15 (×5): 1 [drp] via OPHTHALMIC
  Filled 2017-05-11: qty 5

## 2017-05-11 MED ORDER — LATANOPROST 0.005 % OP SOLN
1.0000 [drp] | Freq: Every day | OPHTHALMIC | Status: DC
  Administered 2017-05-11 – 2017-05-14 (×4): 1 [drp] via OPHTHALMIC
  Filled 2017-05-11: qty 2.5

## 2017-05-11 MED ORDER — PANTOPRAZOLE SODIUM 40 MG PO TBEC
40.0000 mg | DELAYED_RELEASE_TABLET | Freq: Every day | ORAL | Status: DC
  Administered 2017-05-11 – 2017-05-15 (×5): 40 mg via ORAL
  Filled 2017-05-11 (×5): qty 1

## 2017-05-11 NOTE — Progress Notes (Signed)
MEDICATION RELATED CONSULT NOTE - INITIAL   Pharmacy Consult for Cyclophosphamide Indication: WEGENERS GRANULOMATOSIS   Allergies  Allergen Reactions  . Adhesive [Tape] Rash    On on lower extremities     Patient Measurements: Height: 5\' 1"  (154.9 cm) Weight: 136 lb 3.9 oz (61.8 kg) IBW/kg (Calculated) : 47.8 Adjusted Body Weight:   Vital Signs: Temp: 97.9 F (36.6 C) (11/09 2258) Temp Source: Oral (11/09 2258) BP: 110/74 (11/09 2200) Pulse Rate: 110 (11/09 2200) Intake/Output from previous day: No intake/output data recorded. Intake/Output from this shift: Total I/O In: 2300 [IV Piggyback:2300] Out: -   Labs: Recent Labs    05/10/17 05/10/17 1142 05/11/17 1815  WBC 2.5 3.1* 4.4  HGB 8.0* 9.5* 8.5*  HCT 23* 28.0* 24.5*  PLT 305 327 327  CREATININE 1.0 1.1 0.94  ALBUMIN  --  3.0* 3.1*  PROT  --  6.6 5.9*  AST 21 33 42*  ALT 18 24 30   ALKPHOS 124 150 153*  BILITOT  --  0.46 0.8   Estimated Creatinine Clearance: 38.9 mL/min (by C-G formula based on SCr of 0.94 mg/dL).   Microbiology: No results found for this or any previous visit (from the past 720 hour(s)).  Medical History: Past Medical History:  Diagnosis Date  . Acute respiratory failure with hypoxia ([place])   . Anemia   . Aspergillosis ([place])   . Bladder incontinence   . Blindness of one eye   . Cancer ([place])    melanoma   ([place])  . Chronic sinusitis   . Clotting disorder ([place])   . Difficult intubation    Difficult airway with intubation for ARF 07/22/14 (due to anterior larynx, also had mucous plug)  . Fatigue 12/09/2015  . Glaucoma   . Hearing loss in left ear    hearing aid both ears  . History of pulmonary embolism 1997  . Interstitial emphysema ([place])   . Left shoulder pain 12/09/2015  . Multiple allergies   . Neuropathy   . OA (osteoarthritis)   . Pneumonia 12/15  . Shortness of breath dyspnea   . Stroke [facility])    ??? blind left eye  . Wegener's granulomatosis ([place])     Medications:   Medications Prior to Admission  Medication Sig Dispense Refill Last Dose  . acetaminophen (TYLENOL) 650 MG CR tablet Take 650 mg by mouth at bedtime. Can also take 650 mg by mouth 2 times daily as needed   05/11/2017 at Unknown time  . calcium-vitamin D (OSCAL WITH D) 500-200 MG-UNIT per tablet Take 1 tablet by mouth daily.    05/11/2017 at Unknown time  . cholecalciferol (VITAMIN D) 1000 UNITS tablet One daily for vitamin D supplement 30 tablet 11 05/11/2017 at Unknown time  . cyclophosphamide (CYTOXAN) 25 MG tablet Take 25 mg by mouth daily. Give on an empty stomach 1 hour before or 2 hours after meals. Take along with the 50 mg tablet to make a total of 75 mg   05/11/2017 at 0943  . cyclophosphamide (CYTOXAN) 50 MG tablet Take 50 mg by mouth daily. Give on an empty stomach 1 hour before or 2 hours after meals. Take along with 25 mg tablet to make 75 mg   05/11/2017 at 0943  . diclofenac sodium (VOLTAREN) 1 % GEL Apply 2 g topically 3 (three) times daily. Apply to left shoulder.   05/11/2017 at Unknown time  . Fluticasone-Salmeterol (ADVAIR DISKUS) 500-50 MCG/DOSE AEPB INHALE 1 PUFF EVERY 12 HOURS. 180 each 1  05/11/2017 at Unknown time  . folic acid (FOLVITE) 1 MG tablet Take 1 mg by mouth daily.    05/11/2017 at Unknown time  . gabapentin (NEURONTIN) 100 MG capsule Take 100 mg by mouth 3 (three) times daily.   05/11/2017 at Unknown time  . guaiFENesin (MUCINEX) 600 MG 12 hr tablet Take 600 mg by mouth 2 (two) times daily.   05/11/2017 at Unknown time  . hydrocortisone cream 1 % Apply 1 application topically 4 (four) times daily as needed for itching (in addition to the scheduled does).   More than 30 days  . latanoprost (XALATAN) 0.005 % ophthalmic solution Place 1 drop into both eyes at bedtime.    05/10/2017 at Unknown time  . levalbuterol (XOPENEX) 0.63 MG/3ML nebulizer solution Take 0.63 mg by nebulization every 6 (six) hours as needed for wheezing or shortness of breath.   More than 30 days  .  LORazepam (ATIVAN) 0.5 MG tablet Take 1 tablet (0.5 mg total) by mouth at bedtime. 30 tablet 0 05/10/2017 at Unknown time  . moxifloxacin (AVELOX) 400 MG tablet Take 400 mg daily by mouth.   05/11/2017 at Unknown time  . Multiple Vitamins-Minerals (THERAVIM-M) TABS Take 1 tablet by mouth daily.   05/11/2017 at Unknown time  . naphazoline-pheniramine (NAPHCON-A) 0.025-0.3 % ophthalmic solution 1 drop. One drop in left eye three times a day as needed for redness   More than 30 days  . omeprazole (PRILOSEC) 20 MG capsule Take 20 mg by mouth daily.   05/11/2017 at Unknown time  . oxybutynin (DITROPAN) 5 MG tablet One twice daily to help bladder control 60 tablet 5 05/11/2017 at Unknown time  . phenol (SORE THROAT SPRAY) 1.4 % LIQD Use as directed 1 spray in the mouth or throat as needed for throat irritation / pain.   More than 30 days  . saccharomyces boulardii (FLORASTOR) 250 MG capsule Take 250 mg by mouth daily.    05/11/2017 at Unknown time  . sulfamethoxazole-trimethoprim (BACTRIM DS,SEPTRA DS) 800-160 MG per tablet Take 1 tablet by mouth every Monday, Wednesday, and Friday. HOLD WHILE ON AUGMENTIN, THEN RESUME AS PREVIOUS  1 05/11/2017 at Unknown time  . timolol (BETIMOL) 0.5 % ophthalmic solution Place 1 drop into the left eye daily.   05/11/2017 at Unknown time  . UNABLE TO FIND Med Name: [employer]'s magic mouthwash. Swish and spit 5 mL QID PRN   More than 30 days  . warfarin (COUMADIN) 3 MG tablet Take 3 mg by mouth daily.   05/10/2017 at 1620  . warfarin (COUMADIN) 6 MG tablet Take 6 mg once a week by mouth.   Past Week at 1713   Scheduled:  . [START ON 95/62/1308] folic acid  1 mg Oral Daily  . gabapentin  100 mg Oral TID  . guaiFENesin  600 mg Oral BID  . latanoprost  1 drop Both Eyes QHS  . LORazepam  0.5 mg Oral QHS  . mometasone-formoterol  2 puff Inhalation BID  . [START ON 05/12/2017] oxybutynin  5 mg Oral BID  . pantoprazole  40 mg Oral Daily  . timolol  1 drop Left Eye Daily     Assessment: Patient admit with active infection.    Cyclophosphamide (Cytoxan) Hold Criteria:  . Hgb < 8 . ANC < 1000 . Pltc < 100K . Surgery planned this admission . Active infection    Goal of Therapy:  Safe and effective use of cyclophosphamide  Plan:  D/C cyclophosphamide at this time.  Nani Skillern Crowford 05/11/2017,10:59 PM

## 2017-05-11 NOTE — ED Notes (Signed)
Bed: SF68 Expected date:  Expected time:  Means of arrival:  Comments: 82 yo SOB

## 2017-05-11 NOTE — Assessment & Plan Note (Addendum)
the patient has on and off low grade T, cough, sputum production, not feeling well, 05/10/17 wbc 2.5, Hgb 8.0, plt 305, Na 137, K 3.7, Bun 12, creat 0.99. Received IV Iron at Oncology. She has Hx of Wegener's granulomatosis, has been evaluated and treated with Cytoxan by Rheumatology. Obtain CXR ap and lateral views, schedule Xopenex Neb q6h x 3 days in addition to q6h prn. May consider antibiotics is CXR positive for PNA or fever persists.

## 2017-05-11 NOTE — Assessment & Plan Note (Addendum)
05/10/17 fell, lost balance due to large shoes and arm hit wall resulted in skin tear left forearm about 5cm in length, approximated with steri strips. The patient is instructed to wear a lace up shoes instead of sliders

## 2017-05-11 NOTE — Assessment & Plan Note (Signed)
the patient has on and off low grade T, cough, sputum production, not feeling well, 05/10/17 wbc 2.5, Hgb 8.0, plt 305, Na 137, K 3.7, Bun 12, creat 0.99. Received IV Iron at Oncology.

## 2017-05-11 NOTE — ED Triage Notes (Signed)
Pt is presented from from [facility], pt is sick looking, alert and aware but questionable orientation(x1) subjective, tachycardic on arrival, short of breath, bilateral rales and wheezing accompanied by intermittent cough. Notifying EDP and anticipating sepsis protocol.

## 2017-05-11 NOTE — Progress Notes (Signed)
Pharmacy Note   A consult was received from an ED physician for azithromycin and ceftriaxone per pharmacy dosing.   The patient's profile has been reviewed for ht/wt/allergies/indication/available labs.    A one time order has been placed for azithromycin and ceftriaxone.   Further antibiotics/pharmacy consults should be ordered by admitting physician if indicated.                       Thank you,  Royetta Asal, PharmD, BCPS Pager [phone] 05/11/2017 7:07 PM

## 2017-05-11 NOTE — H&P (Signed)
History and Physical    [name] OJJ:[id number] DOB: May 11, 1933 DOA: 05/11/2017  PCP: Blanchie Serve, MD  [facility] from[facility]  Chief Complaint: Shortness of breath.  HPI: [name] is a 83 y.o. female with history of Wegener's granulomatosis, anemia, history of PE, scleritis presents to the ER because of shortness of breath and productive cough and fever chills.  Patient states over the last 3 days patient has been having subjective feeling of fever and chills and started developing shortness of breath.  Patient has been having some productive cough.  Denies any fever or chills.  ED Course: The ER patient was found to be wheezing.  Patient was initially hypotensive and febrile.  Blood cultures were obtained and chest x-ray shows features concerning for pneumonia and was placed on antibiotics for community-acquired pneumonia and admitted for further management patient was given fluid bolus for hypotension which responded with fluids.  Review of Systems: As per HPI, rest all negative.   Past Medical History:  Diagnosis Date  . Acute respiratory failure with hypoxia ([place])   . Anemia   . Aspergillosis ([place])   . Bladder incontinence   . Blindness of one eye   . Cancer ([place])    melanoma   ([place])  . Chronic sinusitis   . Clotting disorder ([place])   . Difficult intubation    Difficult airway with intubation for ARF 07/22/14 (due to anterior larynx, also had mucous plug)  . Fatigue 12/09/2015  . Glaucoma   . Hearing loss in left ear    hearing aid both ears  . History of pulmonary embolism 1997  . Interstitial emphysema ([place])   . Left shoulder pain 12/09/2015  . Multiple allergies   . Neuropathy   . OA (osteoarthritis)   . Pneumonia 12/15  . Shortness of breath dyspnea   . Stroke [facility])    ??? blind left eye  . Wegener's granulomatosis ([place])     Past Surgical History:  Procedure Laterality Date  . bronchosocpy     pt. states she has had over 10 in last 20  yrs.  Marland Kitchen CATARACT EXTRACTION    . KNEE ARTHROSCOPY  2006   rt   . MELANOMA EXCISION     left leg  . PUBOVAGINAL SLING    . TEAR DUCT PROBING    . TUBAL LIGATION    . TYMPANOMASTOIDECTOMY Left 04/28/2015  . VENA CAVA FILTER PLACEMENT  1997   [place] filter     reports that she quit smoking about 32 years ago. Her smoking use included cigarettes. She has a 45.00 pack-year smoking history. she has never used smokeless tobacco. She reports that she does not drink alcohol or use drugs.  Allergies  Allergen Reactions  . Adhesive [Tape] Rash    On on lower extremities     Family History  Problem Relation Age of Onset  . Heart disease Father   . CVA Father   . Congestive Heart Failure Mother   . Heart disease Mother   . Lymphoma Sister   . Dementia Sister   . Alzheimer's disease Sister   . Uterine cancer Sister   . Skin cancer Sister   . Osteoarthritis Sister     Prior to Admission medications   Medication Sig Start Date End Date Taking? Authorizing Provider  acetaminophen (TYLENOL) 650 MG CR tablet Take 650 mg by mouth at bedtime. Can also take 650 mg by mouth 2 times daily as needed  Yes [provider]  calcium-vitamin D (OSCAL WITH D) 500-200 MG-UNIT per tablet Take 1 tablet by mouth daily.    Yes [provider]  cholecalciferol (VITAMIN D) 1000 UNITS tablet One daily for vitamin D supplement 08/10/14  Yes Estill Dooms, MD  cyclophosphamide (CYTOXAN) 25 MG tablet Take 25 mg by mouth daily. Give on an empty stomach 1 hour before or 2 hours after meals. Take along with the 50 mg tablet to make a total of 75 mg   Yes [provider]  cyclophosphamide (CYTOXAN) 50 MG tablet Take 50 mg by mouth daily. Give on an empty stomach 1 hour before or 2 hours after meals. Take along with 25 mg tablet to make 75 mg   Yes [provider]  diclofenac sodium (VOLTAREN) 1 % GEL Apply 2 g topically 3 (three) times daily. Apply to left shoulder. 04/10/16  Yes  [provider]  Fluticasone-Salmeterol (ADVAIR DISKUS) 500-50 MCG/DOSE AEPB INHALE 1 PUFF EVERY 12 HOURS. 02/05/15  Yes Juanito Doom, MD  folic acid (FOLVITE) 1 MG tablet Take 1 mg by mouth daily.  10/24/16  Yes [provider]  gabapentin (NEURONTIN) 100 MG capsule Take 100 mg by mouth 3 (three) times daily.   Yes [provider]  guaiFENesin (MUCINEX) 600 MG 12 hr tablet Take 600 mg by mouth 2 (two) times daily.   Yes [provider]  hydrocortisone cream 1 % Apply 1 application topically 4 (four) times daily as needed for itching (in addition to the scheduled does).   Yes [provider]  latanoprost (XALATAN) 0.005 % ophthalmic solution Place 1 drop into both eyes at bedtime.    Yes [provider]  levalbuterol (XOPENEX) 0.63 MG/3ML nebulizer solution Take 0.63 mg by nebulization every 6 (six) hours as needed for wheezing or shortness of breath.   Yes [provider]  LORazepam (ATIVAN) 0.5 MG tablet Take 1 tablet (0.5 mg total) by mouth at bedtime. 03/20/17  Yes Blanchie Serve, MD  moxifloxacin (AVELOX) 400 MG tablet Take 400 mg daily by mouth. 05/11/17  Yes [provider]  Multiple Vitamins-Minerals (THERAVIM-M) TABS Take 1 tablet by mouth daily.   Yes [provider]  naphazoline-pheniramine (NAPHCON-A) 0.025-0.3 % ophthalmic solution 1 drop. One drop in left eye three times a day as needed for redness   Yes [provider]  omeprazole (PRILOSEC) 20 MG capsule Take 20 mg by mouth daily.   Yes [provider]  oxybutynin (DITROPAN) 5 MG tablet One twice daily to help bladder control 10/26/16  Yes Estill Dooms, MD  phenol (SORE THROAT SPRAY) 1.4 % LIQD Use as directed 1 spray in the mouth or throat as needed for throat irritation / pain.   Yes [provider]  saccharomyces boulardii (FLORASTOR) 250 MG capsule Take 250 mg by mouth daily.    Yes [provider]    sulfamethoxazole-trimethoprim (BACTRIM DS,SEPTRA DS) 800-160 MG per tablet Take 1 tablet by mouth every Monday, Wednesday, and Friday. HOLD WHILE ON AUGMENTIN, THEN RESUME AS PREVIOUS 08/18/14  Yes Delfina Redwood, MD  timolol (BETIMOL) 0.5 % ophthalmic solution Place 1 drop into the left eye daily.   Yes [provider]  UNABLE TO FIND Med Name: [facility] magic mouthwash. Swish and spit 5 mL QID PRN   Yes [provider]  warfarin (COUMADIN) 3 MG tablet Take 3 mg by mouth daily.   Yes [provider]  warfarin (COUMADIN) 6 MG  tablet Take 6 mg once a week by mouth.   Yes [provider]    Physical Exam: Vitals:   05/11/17 2015 05/11/17 2030 05/11/17 2045 05/11/17 2100  BP:  (!) 98/58  (!) 109/55  Pulse: (!) 113 (!) 112 (!) 110 (!) 111  Resp: 20 (!) 24 (!) 25 (!) 21  Temp:      TempSrc:      SpO2: 97% 100% 98% 99%  Weight:      Height:          Constitutional: Moderately built and nourished. Vitals:   05/11/17 2015 05/11/17 2030 05/11/17 2045 05/11/17 2100  BP:  (!) 98/58  (!) 109/55  Pulse: (!) 113 (!) 112 (!) 110 (!) 111  Resp: 20 (!) 24 (!) 25 (!) 21  Temp:      TempSrc:      SpO2: 97% 100% 98% 99%  Weight:      Height:       Eyes: Anicteric mild pallor. ENMT: No discharge from the ears eyes nose or mouth. Neck: No mass felt.  No neck rigidity. Respiratory: No rhonchi or crepitations. Cardiovascular: S1-S2.  No murmurs appreciated. Abdomen: Soft nontender bowel sounds present. Musculoskeletal: No edema.  No joint effusion. Skin: No rash.  Skin appears warm. Neurologic: Alert awake oriented to time place and person.  Moves all extremities. Psychiatric: Appears normal.  Normal affect.   Labs on Admission: I have personally reviewed following labs and imaging studies  CBC: Recent Labs  Lab 05/10/17 05/10/17 1142 05/11/17 1815  WBC 2.5 3.1* 4.4  NEUTROABS  --  2.4 3.6  HGB 8.0* 9.5* 8.5*  HCT 23* 28.0* 24.5*  MCV  --  110*  105.6*  PLT 305 327 678   Basic Metabolic Panel: Recent Labs  Lab 05/10/17 05/10/17 1142 05/11/17 1815  NA 137 137 134*  K 3.7 3.9 3.9  CL  --   --  105  CO2  --  22 20*  GLUCOSE  --  102 100*  BUN 12 12.5 14  CREATININE 1.0 1.1 0.94  CALCIUM  --  10.1 9.3   GFR: Estimated Creatinine Clearance: 38.8 mL/min (by C-G formula based on SCr of 0.94 mg/dL). Liver Function Tests: Recent Labs  Lab 05/10/17 05/10/17 1142 05/11/17 1815  AST 21 33 42*  ALT 18 24 30   ALKPHOS 124 150 153*  BILITOT  --  0.46 0.8  PROT  --  6.6 5.9*  ALBUMIN  --  3.0* 3.1*   No results for input(s): LIPASE, AMYLASE in the last 168 hours. No results for input(s): AMMONIA in the last 168 hours. Coagulation Profile: No results for input(s): INR, PROTIME in the last 168 hours. Cardiac Enzymes: No results for input(s): CKTOTAL, CKMB, CKMBINDEX, TROPONINI in the last 168 hours. BNP (last 3 results) No results for input(s): PROBNP in the last 8760 hours. HbA1C: No results for input(s): HGBA1C in the last 72 hours. CBG: No results for input(s): GLUCAP in the last 168 hours. Lipid Profile: No results for input(s): CHOL, HDL, LDLCALC, TRIG, CHOLHDL, LDLDIRECT in the last 72 hours. Thyroid Function Tests: No results for input(s): TSH, T4TOTAL, FREET4, T3FREE, THYROIDAB in the last 72 hours. Anemia Panel: Recent Labs    05/10/17 1142  FERRITIN 704*  TIBC 165*  IRON 48   Urine analysis:    Component Value Date/Time   COLORURINE YELLOW 07/20/2014 1340   APPEARANCEUR CLEAR 07/20/2014 1340   LABSPEC 1.012 07/20/2014 1340   PHURINE 6.0  07/20/2014 1340   GLUCOSEU NEGATIVE 07/20/2014 1340   [place] 07/20/2014 1340   BILIRUBINUR neg 11/17/2014 [place] 07/20/2014 1340   PROTEINUR trace 11/17/2014 1744   PROTEINUR NEGATIVE 07/20/2014 1340   UROBILINOGEN 0.2 11/17/2014 1744   UROBILINOGEN 0.2 07/20/2014 1340   NITRITE neg 11/17/2014 1744   NITRITE NEGATIVE 07/20/2014 1340    LEUKOCYTESUR Trace 11/17/2014 1744   Sepsis Labs: @LABRCNTIP (procalcitonin:4,lacticidven:4) )No results found for this or any previous visit (from the past 240 hour(s)).   Radiological Exams on Admission: Dg Chest 2 View  Result Date: 05/11/2017 CLINICAL DATA:  Initial evaluation for acute shortness of breath and fever. EXAM: CHEST  2 VIEW COMPARISON:  Prior radiograph from 02/01/2017. FINDINGS: Cardiomegaly, stable from previous. Mediastinal silhouette within normal limits. Aortic atherosclerosis. Lungs are hypoinflated. Patchy left lower lobe infiltrate, concerning for pneumonia given provided history. Streaky right basilar atelectasis. Perihilar vascular congestion without overt pulmonary edema. No pleural effusion. No pneumothorax. No acute osseus abnormality. Degenerative changes about the left shoulder. IMPRESSION: 1. Patchy left lower lobe infiltrate, concerning for pneumonia given provided history. 2. Stable cardiomegaly with mild perihilar vascular congestion without frank pulmonary edema. Electronically Signed   By: Jeannine Boga M.D.   On: 05/11/2017 19:43    EKG: Independently reviewed.  Sinus with possible ectopic atrial tachycardia.  Assessment/Plan Principal Problem:   Sepsis ([place]) Active Problems:   WEGENERS GRANULOMATOSIS   Intrinsic asthma   Pulmonary embolism (HCC)   Chronic anemia    1. Sepsis likely from pneumonia -patient has been placed on ceftriaxone and Zithromax.  Check influenza PCR urine for Legionella and strep antigen will follow blood cultures continue hydration.  Sputum cultures. 2. History of Wegener's granulomatosis and scleritis with poor vision on the left eye -on Cytoxan.  3. History of asthma -continue inhalers. 4. History of PE on Coumadin which will be dosed per pharmacy. 5. Anemia of chronic disease -hemoglobin appears to be at baseline.  Follow CBC.  I have reviewed patient's old charts and labs.   DVT prophylaxis: Coumadin. Code  Status: Full code. Family Communication: Discussed with patient. Disposition Plan: Home. Consults called: None. Admission status: Inpatient.   Rise Patience MD [facility] Pager [phone].  If 7PM-7AM, please contact night-coverage [email or web address] Password [facility]  05/11/2017, 9:49 PM

## 2017-05-12 ENCOUNTER — Other Ambulatory Visit: Payer: Self-pay

## 2017-05-12 DIAGNOSIS — I2782 Chronic pulmonary embolism: Secondary | ICD-10-CM

## 2017-05-12 DIAGNOSIS — D649 Anemia, unspecified: Secondary | ICD-10-CM

## 2017-05-12 DIAGNOSIS — M3130 Wegener's granulomatosis without renal involvement: Secondary | ICD-10-CM

## 2017-05-12 LAB — URINALYSIS, ROUTINE W REFLEX MICROSCOPIC
Bilirubin Urine: NEGATIVE
Glucose, UA: NEGATIVE mg/dL
KETONES UR: NEGATIVE mg/dL
Nitrite: NEGATIVE
PH: 6 (ref 5.0–8.0)
PROTEIN: NEGATIVE mg/dL
SQUAMOUS EPITHELIAL / LPF: NONE SEEN
Specific Gravity, Urine: 1.004 — ABNORMAL LOW (ref 1.005–1.030)

## 2017-05-12 LAB — BASIC METABOLIC PANEL
ANION GAP: 6 (ref 5–15)
BUN: 11 mg/dL (ref 6–20)
CALCIUM: 8.7 mg/dL — AB (ref 8.9–10.3)
CO2: 22 mmol/L (ref 22–32)
CREATININE: 0.82 mg/dL (ref 0.44–1.00)
Chloride: 113 mmol/L — ABNORMAL HIGH (ref 101–111)
GFR calc Af Amer: >60 mL/min (ref >60)
GLUCOSE: 101 mg/dL — AB (ref 65–99)
Potassium: 3.5 mmol/L (ref 3.5–5.1)
Sodium: 141 mmol/L (ref 135–145)

## 2017-05-12 LAB — INFLUENZA PANEL BY PCR (TYPE A & B)
INFLBPCR: NEGATIVE
Influenza A By PCR: NEGATIVE

## 2017-05-12 LAB — CBC
HCT: 24.2 % — ABNORMAL LOW (ref 36.0–46.0)
HEMOGLOBIN: 8.2 g/dL — AB (ref 12.0–15.0)
MCH: 36.1 pg — AB (ref 26.0–34.0)
MCHC: 33.9 g/dL (ref 30.0–36.0)
MCV: 106.6 fL — ABNORMAL HIGH (ref 78.0–100.0)
PLATELETS: 296 10*3/uL (ref 150–400)
RBC: 2.27 MIL/uL — ABNORMAL LOW (ref 3.87–5.11)
RDW: 18.4 % — AB (ref 11.5–15.5)
WBC: 4.2 10*3/uL (ref 4.0–10.5)

## 2017-05-12 LAB — EXPECTORATED SPUTUM ASSESSMENT W REFEX TO RESP CULTURE

## 2017-05-12 LAB — PROTIME-INR
INR: 2.9
INR: 2.92
PROTHROMBIN TIME: 30.2 s — AB (ref 11.4–15.2)
Prothrombin Time: 30.1 seconds — ABNORMAL HIGH (ref 11.4–15.2)

## 2017-05-12 LAB — MRSA PCR SCREENING: MRSA by PCR: POSITIVE — AB

## 2017-05-12 LAB — EXPECTORATED SPUTUM ASSESSMENT W GRAM STAIN, RFLX TO RESP C

## 2017-05-12 LAB — STREP PNEUMONIAE URINARY ANTIGEN: STREP PNEUMO URINARY ANTIGEN: NEGATIVE

## 2017-05-12 MED ORDER — WARFARIN SODIUM 6 MG PO TABS: 6.0000 mg | ORAL_TABLET | ORAL | Status: DC

## 2017-05-12 MED ORDER — WARFARIN - PHARMACIST DOSING INPATIENT: Freq: Every day | Status: DC

## 2017-05-12 MED ORDER — MUPIROCIN 2 % EX OINT
1.0000 "application " | TOPICAL_OINTMENT | Freq: Two times a day (BID) | CUTANEOUS | Status: DC
  Administered 2017-05-12 – 2017-05-15 (×7): 1 via NASAL
  Filled 2017-05-12: qty 22

## 2017-05-12 MED ORDER — WARFARIN SODIUM 3 MG PO TABS
3.0000 mg | ORAL_TABLET | ORAL | Status: DC
  Administered 2017-05-12 – 2017-05-14 (×3): 3 mg via ORAL
  Filled 2017-05-12 (×3): qty 1

## 2017-05-12 MED ORDER — CHLORHEXIDINE GLUCONATE CLOTH 2 % EX PADS
6.0000 | MEDICATED_PAD | Freq: Every day | CUTANEOUS | Status: DC
  Administered 2017-05-13 – 2017-05-15 (×3): 6 via TOPICAL

## 2017-05-12 NOTE — Progress Notes (Signed)
PROGRESS NOTE    [name]  WUX:[id number] DOB: [birth year] DOA: 05/11/2017 PCP: Blanchie Serve, MD    Brief Narrative: [name] is a 83 y.o. female with history of Wegener's granulomatosis, anemia, history of PE, scleritis presents to the ER because of shortness of breath and productive cough and fever chills. She was found to have left lobar pneumonia. She was admitted to medical service for management of sepsis and pneumonia.     Assessment & Plan:   Principal Problem:   Sepsis ([place]) Active Problems:   WEGENERS GRANULOMATOSIS   Intrinsic asthma   Pulmonary embolism (HCC)   Chronic anemia   Sepsis from left lobar pneumonia:  BP have improved, influenza PCR is negative.  Urine for strep and legionella anti gen is pending.  Blood cultures and sputum cultures are pending.  Resume IV rocephin and zithromax . Resume IV fluids.  Trend lactic acid.  Pro calcitonin negative.   H/o wegener's granulomatosis:  - cytoxan on hold for the ongoing infection.    Asthma:  Scattered wheezing.    H/o PE: On coumadin.    Macrocytic anemia:  Hemoglobin stable around 8.  Get anemia panel.    DVT prophylaxis: lovenox.  Code Status: full code.  Family Communication: son at bedside.  Disposition Plan: pending PT evaluation.    Consultants:   None.   Procedures: none.   Antimicrobials:  Rocephin and zithromax.    Subjective: Persistent coughing.   Objective: Vitals:   05/12/17 0500 05/12/17 0600 05/12/17 1143 05/12/17 1200  BP:  (!) 115/52    Pulse:  (!) 109 93   Resp:  (!) 24 (!) 23   Temp:    98.4 F (36.9 C)  TempSrc:    Oral  SpO2:  96% 98%   Weight: 61.8 kg (136 lb 3.9 oz)     Height:        Intake/Output Summary (Last 24 hours) at 05/12/2017 1441 Last data filed at 05/12/2017 0600 Gross per 24 hour  Intake 2700 ml  Output 800 ml  Net 1900 ml   Filed Weights   05/11/17 1838 05/11/17 2258 05/12/17 0500  Weight: 61.2 kg (135 lb) 61.8 kg  (136 lb 3.9 oz) 61.8 kg (136 lb 3.9 oz)    Examination:  General exam: Appears calm and comfortable  On 2 lit of [place] oxygen.  Respiratory system:  Respiratory effort normal. Scattered rhonchi.  Cardiovascular system: S1 & S2 heard, RRR. No JVD, murmurs, rubs, gallops or clicks. No pedal edema. Gastrointestinal system: Abdomen is nondistended, soft and nontender. No organomegaly or masses felt. Normal bowel sounds heard. Central nervous system: Alert and oriented. No focal neurological deficits. Extremities: Symmetric 5 x 5 power. Skin: No rashes, lesions or ulcers Psychiatry:Mood & affect appropriate.     Data Reviewed: I have personally reviewed following labs and imaging studies  CBC: Recent Labs  Lab 05/10/17 05/10/17 1142 05/11/17 1815 05/12/17 0328  WBC 2.5 3.1* 4.4 4.2  NEUTROABS  --  2.4 3.6  --   HGB 8.0* 9.5* 8.5* 8.2*  HCT 23* 28.0* 24.5* 24.2*  MCV  --  110* 105.6* 106.6*  PLT 305 327 327 253   Basic Metabolic Panel: Recent Labs  Lab 05/10/17 05/10/17 1142 05/11/17 1815 05/12/17 0328  NA 137 137 134* 141  K 3.7 3.9 3.9 3.5  CL  --   --  105 113*  CO2  --  22 20* 22  GLUCOSE  --  102 100* 101*  BUN 12 12.5 14 11   CREATININE 1.0 1.1 0.94 0.82  CALCIUM  --  10.1 9.3 8.7*   GFR: Estimated Creatinine Clearance: 44.6 mL/min (by C-G formula based on SCr of 0.82 mg/dL). Liver Function Tests: Recent Labs  Lab 05/10/17 05/10/17 1142 05/11/17 1815  AST 21 33 42*  ALT 18 24 30   ALKPHOS 124 150 153*  BILITOT  --  0.46 0.8  PROT  --  6.6 5.9*  ALBUMIN  --  3.0* 3.1*   No results for input(s): LIPASE, AMYLASE in the last 168 hours. No results for input(s): AMMONIA in the last 168 hours. Coagulation Profile: Recent Labs  Lab 05/11/17 2352 05/12/17 0819  INR 2.92 2.90   Cardiac Enzymes: No results for input(s): CKTOTAL, CKMB, CKMBINDEX, TROPONINI in the last 168 hours. BNP (last 3 results) No results for input(s): PROBNP in the last 8760  hours. HbA1C: No results for input(s): HGBA1C in the last 72 hours. CBG: No results for input(s): GLUCAP in the last 168 hours. Lipid Profile: No results for input(s): CHOL, HDL, LDLCALC, TRIG, CHOLHDL, LDLDIRECT in the last 72 hours. Thyroid Function Tests: No results for input(s): TSH, T4TOTAL, FREET4, T3FREE, THYROIDAB in the last 72 hours. Anemia Panel: Recent Labs    05/10/17 1142  FERRITIN 704*  TIBC 165*  IRON 48   Sepsis Labs: Recent Labs  Lab 05/11/17 1815 05/11/17 1832  PROCALCITON <0.10  --   LATICACIDVEN  --  0.83    Recent Results (from the past 240 hour(s))  MRSA PCR Screening     Status: Abnormal   Collection Time: 05/11/17 10:57 PM  Result Value Ref Range Status   MRSA by PCR POSITIVE (A) NEGATIVE Final    Comment:        The GeneXpert MRSA Assay (FDA approved for NASAL specimens only), is one component of a comprehensive MRSA colonization surveillance program. It is not intended to diagnose MRSA infection nor to guide or monitor treatment for MRSA infections. RESULT CALLED TO, READ BACK BY AND VERIFIED WITH: MCINTOSH,R RN [phone] [id number] COVINGTON,N   Culture, sputum-assessment     Status: None   Collection Time: 05/12/17  7:40 AM  Result Value Ref Range Status   Specimen Description EXPECTORATED SPUTUM  Final   Special Requests NONE  Final   Sputum evaluation THIS SPECIMEN IS ACCEPTABLE FOR SPUTUM CULTURE  Final   Report Status 05/12/2017 FINAL  Final  Culture, respiratory (NON-Expectorated)     Status: None (Preliminary result)   Collection Time: 05/12/17  7:40 AM  Result Value Ref Range Status   Specimen Description EXPECTORATED SPUTUM  Final   Special Requests NONE Reflexed from [id number]  Final   Gram Stain   Final    ABUNDANT WBC PRESENT, PREDOMINANTLY PMN RARE SQUAMOUS EPITHELIAL CELLS PRESENT FEW GRAM POSITIVE RODS RARE GRAM POSITIVE COCCI IN PAIRS Performed at [facility], [place] [address], [place], [place] [address]    Culture  PENDING  Incomplete   Report Status PENDING  Incomplete         Radiology Studies: Dg Chest 2 View  Result Date: 05/11/2017 CLINICAL DATA:  Initial evaluation for acute shortness of breath and fever. EXAM: CHEST  2 VIEW COMPARISON:  Prior radiograph from 02/01/2017. FINDINGS: Cardiomegaly, stable from previous. Mediastinal silhouette within normal limits. Aortic atherosclerosis. Lungs are hypoinflated. Patchy left lower lobe infiltrate, concerning for pneumonia given provided history. Streaky right basilar atelectasis. Perihilar vascular congestion without overt pulmonary edema. No pleural effusion. No pneumothorax.  No acute osseus abnormality. Degenerative changes about the left shoulder. IMPRESSION: 1. Patchy left lower lobe infiltrate, concerning for pneumonia given provided history. 2. Stable cardiomegaly with mild perihilar vascular congestion without frank pulmonary edema. Electronically Signed   By: Jeannine Boga M.D.   On: 05/11/2017 19:43        Scheduled Meds: . Chlorhexidine Gluconate Cloth  6 each Topical Q0600  . folic acid  1 mg Oral Daily  . gabapentin  100 mg Oral TID  . guaiFENesin  600 mg Oral BID  . latanoprost  1 drop Both Eyes QHS  . LORazepam  0.5 mg Oral QHS  . mometasone-formoterol  2 puff Inhalation BID  . mupirocin ointment  1 application Nasal BID  . oxybutynin  5 mg Oral BID  . pantoprazole  40 mg Oral Daily  . timolol  1 drop Left Eye Daily  . warfarin  3 mg Oral Once per day on Sun Mon Wed Thu Fri Sat  . [START ON 05/15/2017] warfarin  6 mg Oral Once per day on Tue  . Warfarin - Pharmacist Dosing Inpatient   Does not apply q1800   Continuous Infusions: . sodium chloride Stopped (05/12/17 0940)  . azithromycin    . cefTRIAXone (ROCEPHIN)  IV       LOS: 1 day    Time spent: 35 minutes.     Hosie Poisson, MD [facility] Pager [phone]  If 7PM-7AM, please contact night-coverage [email or web address] Password [email or web address] 05/12/2017,  2:41 PM

## 2017-05-12 NOTE — Progress Notes (Signed)
ANTICOAGULATION CONSULT NOTE - Initial Consult  Pharmacy Consult for warfarin Indication: stroke--VTE treatment  Allergies  Allergen Reactions  . Adhesive [Tape] Rash    On on lower extremities     Patient Measurements: Height: 5\' 1"  (154.9 cm) Weight: 136 lb 3.9 oz (61.8 kg) IBW/kg (Calculated) : 47.8 Heparin Dosing Weight:   Vital Signs: Temp: 98.1 F (36.7 C) (11/10 0344) Temp Source: Axillary (11/10 0344) BP: 108/50 (11/10 0200) Pulse Rate: 102 (11/10 0200)  Labs: Recent Labs    05/10/17 05/10/17 1142 05/11/17 1815 05/11/17 2352 05/12/17 0328  HGB 8.0* 9.5* 8.5*  --  8.2*  HCT 23* 28.0* 24.5*  --  24.2*  PLT 305 327 327  --  296  LABPROT  --   --   --  30.2*  --   INR  --   --   --  2.92  --   CREATININE 1.0 1.1 0.94  --   --     Estimated Creatinine Clearance: 38.9 mL/min (by C-G formula based on SCr of 0.94 mg/dL).   Medical History: Past Medical History:  Diagnosis Date  . Acute respiratory failure with hypoxia ([place])   . Anemia   . Aspergillosis ([place])   . Bladder incontinence   . Blindness of one eye   . Cancer ([place])    melanoma   (chemo)  . Chronic sinusitis   . Clotting disorder ([place])   . Difficult intubation    Difficult airway with intubation for ARF 07/22/14 (due to anterior larynx, also had mucous plug)  . Fatigue 12/09/2015  . Glaucoma   . Hearing loss in left ear    hearing aid both ears  . History of pulmonary embolism 1997  . Interstitial emphysema ([place])   . Left shoulder pain 12/09/2015  . Multiple allergies   . Neuropathy   . OA (osteoarthritis)   . Pneumonia 12/15  . Shortness of breath dyspnea   . Stroke [facility])    ??? blind left eye  . Wegener's granulomatosis ([place])     Medications:  Medications Prior to Admission  Medication Sig Dispense Refill Last Dose  . acetaminophen (TYLENOL) 650 MG CR tablet Take 650 mg by mouth at bedtime. Can also take 650 mg by mouth 2 times daily as needed   05/11/2017 at Unknown time  .  calcium-vitamin D (OSCAL WITH D) 500-200 MG-UNIT per tablet Take 1 tablet by mouth daily.    05/11/2017 at Unknown time  . cholecalciferol (VITAMIN D) 1000 UNITS tablet One daily for vitamin D supplement 30 tablet 11 05/11/2017 at Unknown time  . cyclophosphamide (CYTOXAN) 25 MG tablet Take 25 mg by mouth daily. Give on an empty stomach 1 hour before or 2 hours after meals. Take along with the 50 mg tablet to make a total of 75 mg   05/11/2017 at 0943  . cyclophosphamide (CYTOXAN) 50 MG tablet Take 50 mg by mouth daily. Give on an empty stomach 1 hour before or 2 hours after meals. Take along with 25 mg tablet to make 75 mg   05/11/2017 at 0943  . diclofenac sodium (VOLTAREN) 1 % GEL Apply 2 g topically 3 (three) times daily. Apply to left shoulder.   05/11/2017 at Unknown time  . Fluticasone-Salmeterol (ADVAIR DISKUS) 500-50 MCG/DOSE AEPB INHALE 1 PUFF EVERY 12 HOURS. 180 each 1 05/11/2017 at Unknown time  . folic acid (FOLVITE) 1 MG tablet Take 1 mg by mouth daily.    05/11/2017 at Unknown time  .  gabapentin (NEURONTIN) 100 MG capsule Take 100 mg by mouth 3 (three) times daily.   05/11/2017 at Unknown time  . guaiFENesin (MUCINEX) 600 MG 12 hr tablet Take 600 mg by mouth 2 (two) times daily.   05/11/2017 at Unknown time  . hydrocortisone cream 1 % Apply 1 application topically 4 (four) times daily as needed for itching (in addition to the scheduled does).   More than 30 days  . latanoprost (XALATAN) 0.005 % ophthalmic solution Place 1 drop into both eyes at bedtime.    05/10/2017 at Unknown time  . levalbuterol (XOPENEX) 0.63 MG/3ML nebulizer solution Take 0.63 mg by nebulization every 6 (six) hours as needed for wheezing or shortness of breath.   More than 30 days  . LORazepam (ATIVAN) 0.5 MG tablet Take 1 tablet (0.5 mg total) by mouth at bedtime. 30 tablet 0 05/10/2017 at Unknown time  . moxifloxacin (AVELOX) 400 MG tablet Take 400 mg daily by mouth.   05/11/2017 at Unknown time  . Multiple Vitamins-Minerals  (THERAVIM-M) TABS Take 1 tablet by mouth daily.   05/11/2017 at Unknown time  . naphazoline-pheniramine (NAPHCON-A) 0.025-0.3 % ophthalmic solution 1 drop. One drop in left eye three times a day as needed for redness   More than 30 days  . omeprazole (PRILOSEC) 20 MG capsule Take 20 mg by mouth daily.   05/11/2017 at Unknown time  . oxybutynin (DITROPAN) 5 MG tablet One twice daily to help bladder control 60 tablet 5 05/11/2017 at Unknown time  . phenol (SORE THROAT SPRAY) 1.4 % LIQD Use as directed 1 spray in the mouth or throat as needed for throat irritation / pain.   More than 30 days  . saccharomyces boulardii (FLORASTOR) 250 MG capsule Take 250 mg by mouth daily.    05/11/2017 at Unknown time  . sulfamethoxazole-trimethoprim (BACTRIM DS,SEPTRA DS) 800-160 MG per tablet Take 1 tablet by mouth every Monday, Wednesday, and Friday. HOLD WHILE ON AUGMENTIN, THEN RESUME AS PREVIOUS  1 05/11/2017 at Unknown time  . timolol (BETIMOL) 0.5 % ophthalmic solution Place 1 drop into the left eye daily.   05/11/2017 at Unknown time  . UNABLE TO FIND Med Name: [employer]'s magic mouthwash. Swish and spit 5 mL QID PRN   More than 30 days  . warfarin (COUMADIN) 3 MG tablet Take 3 mg by mouth daily.   05/10/2017 at 1620  . warfarin (COUMADIN) 6 MG tablet Take 6 mg once a week by mouth.   Past Week at 1713   Scheduled:  . folic acid  1 mg Oral Daily  . gabapentin  100 mg Oral TID  . guaiFENesin  600 mg Oral BID  . latanoprost  1 drop Both Eyes QHS  . LORazepam  0.5 mg Oral QHS  . mometasone-formoterol  2 puff Inhalation BID  . oxybutynin  5 mg Oral BID  . pantoprazole  40 mg Oral Daily  . timolol  1 drop Left Eye Daily  . warfarin  3 mg Oral Once per day on Sun Mon Wed Thu Fri Sat  . [START ON 05/15/2017] warfarin  6 mg Oral Once per day on Tue  . Warfarin - Pharmacist Dosing Inpatient   Does not apply q1800    Assessment: Patient with chronic warfarin for VTE treatment.  INR on admit 2.92.  Last dose noted on  11/8  Goal of Therapy:  INR 2-3    Plan:  Daily INR Continue home warfarin dose at this time.  Tyler Deis, Shea Stakes [place] 05/12/2017,5:11 AM

## 2017-05-12 NOTE — Progress Notes (Signed)
ANTICOAGULATION CONSULT NOTE - Initial Consult  Pharmacy Consult for warfarin Indication: Hx of PE  Allergies  Allergen Reactions  . Adhesive [Tape] Rash    On on lower extremities    Patient Measurements: Height: 5\' 1"  (154.9 cm) Weight: 136 lb 3.9 oz (61.8 kg) IBW/kg (Calculated) : 47.8  Labs: Recent Labs    05/10/17 1142 05/11/17 1815 05/11/17 2352 05/12/17 0328 05/12/17 0819  HGB 9.5* 8.5*  --  8.2*  --   HCT 28.0* 24.5*  --  24.2*  --   PLT 327 327  --  296  --   LABPROT  --   --  30.2*  --  30.1*  INR  --   --  2.92  --  2.90  CREATININE 1.1 0.94  --  0.82  --    Estimated Creatinine Clearance: 44.6 mL/min (by C-G formula based on SCr of 0.82 mg/dL).  Medications:   Scheduled:  . Chlorhexidine Gluconate Cloth  6 each Topical Q0600  . folic acid  1 mg Oral Daily  . gabapentin  100 mg Oral TID  . guaiFENesin  600 mg Oral BID  . latanoprost  1 drop Both Eyes QHS  . LORazepam  0.5 mg Oral QHS  . mometasone-formoterol  2 puff Inhalation BID  . mupirocin ointment  1 application Nasal BID  . oxybutynin  5 mg Oral BID  . pantoprazole  40 mg Oral Daily  . timolol  1 drop Left Eye Daily  . warfarin  3 mg Oral Once per day on Sun Mon Wed Thu Fri Sat  . [START ON 05/15/2017] warfarin  6 mg Oral Once per day on Tue  . Warfarin - Pharmacist Dosing Inpatient   Does not apply q1800   Assessment: 42 yoF from SNF with SHOB, productive cough, fever,chills. CTX/Azith per Rx for CAP.   PMH: Wegener's granulomatosis - Cyclophosphamide Hx PE: chronic Warfarin 3mg  daily except 6mg  Tu--LD 11/8. Admitting INR 2.92  Today, 05/12/2017  INR 2.90  Home Warfarin regimen resumed, will adjust if necessary  Abx may increase INR  Goal of Therapy:  INR 2-3   Plan:  Daily INR Continue home warfarin dose at this time.    Minda Ditto PharmD Pager [phone] 05/12/2017, 10:07 AM

## 2017-05-13 LAB — PROTIME-INR
INR: 2.7
Prothrombin Time: 28.4 seconds — ABNORMAL HIGH (ref 11.4–15.2)

## 2017-05-13 NOTE — Progress Notes (Signed)
PROGRESS NOTE    [name]  SWN:[id number] DOB: September 11, 1932 DOA: 05/11/2017 PCP: Blanchie Serve, MD    Brief Narrative: [name] is a 83 y.o. female with history of Wegener's granulomatosis, anemia, history of PE, scleritis presents to the ER because of shortness of breath and productive cough and fever chills. She was found to have left lobar pneumonia. She was admitted to medical service for management of sepsis and pneumonia.     Assessment & Plan:   Principal Problem:   Sepsis ([place]) Active Problems:   WEGENERS GRANULOMATOSIS   Intrinsic asthma   Pulmonary embolism (HCC)   Chronic anemia   Sepsis from left lobar pneumonia:  Pt reports her coughing is better, but her breathing is not back to baseline.  She continues to require oxygen.  BP have improved, influenza PCR is negative.  Urine for strep antigen is negative and legionella anti gen is pending.  Blood cultures and sputum cultures are pending.  Resume IV rocephin and zithromax . Resume IV fluids.  Trend lactic acid.  Pro calcitonin negative.   H/o wegener's granulomatosis:  - cytoxan on hold for the ongoing infection.    Asthma:  Scattered wheezing.    H/o PE: On coumadin. INR therapeutic.    Macrocytic anemia:  Hemoglobin stable around 8.  Get anemia panel. Shows elevated ferritin. Iron is 48.    DVT prophylaxis: lovenox.  Code Status: full code.  Family Communication: none at bedside.  Disposition Plan: pending PT evaluation.    Consultants:   None.   Procedures: none.   Antimicrobials:  Rocephin and zithromax.    Subjective: Persistent coughing. Breathing is still not improved.  Objective: Vitals:   05/12/17 2207 05/13/17 0551 05/13/17 0848 05/13/17 1252  BP: 116/67 129/62  (!) 100/57  Pulse: (!) 108 (!) 118  86  Resp: 20 18  18   Temp: 99.3 F (37.4 C) 99 F (37.2 C)    TempSrc: Oral Oral  Oral  SpO2: 95% 94% 96% 98%  Weight:  62.2 kg (137 lb 2 oz)    Height:         Intake/Output Summary (Last 24 hours) at 05/13/2017 1525 Last data filed at 05/13/2017 0600 Gross per 24 hour  Intake 1425 ml  Output 800 ml  Net 625 ml   Filed Weights   05/11/17 2258 05/12/17 0500 05/13/17 0551  Weight: 61.8 kg (136 lb 3.9 oz) 61.8 kg (136 lb 3.9 oz) 62.2 kg (137 lb 2 oz)    Examination:  General exam: Appears calm and comfortable  On 2 lit of [place] oxygen.  Respiratory system:  Respiratory effort normal. Scattered rhonchi.  Cardiovascular system: S1 & S2 heard, RRR. No JVD, murmurs, No pedal edema. Gastrointestinal system: Abdomen is soft non tender non distended bowel sounds heard.  Central nervous system: Alert and oriented. Non focal.  Extremities: Symmetric 5 x 5 power.no cyanosis, clubbing,  Skin: No rashes, lesions or ulcers Psychiatry:Mood & affect appropriate.     Data Reviewed: I have personally reviewed following labs and imaging studies  CBC: Recent Labs  Lab 05/10/17 05/10/17 1142 05/11/17 1815 05/12/17 0328  WBC 2.5 3.1* 4.4 4.2  NEUTROABS  --  2.4 3.6  --   HGB 8.0* 9.5* 8.5* 8.2*  HCT 23* 28.0* 24.5* 24.2*  MCV  --  110* 105.6* 106.6*  PLT 305 327 327 938   Basic Metabolic Panel: Recent Labs  Lab 05/10/17 05/10/17 1142 05/11/17 1815 05/12/17 0328  NA 137 137  134* 141  K 3.7 3.9 3.9 3.5  CL  --   --  105 113*  CO2  --  22 20* 22  GLUCOSE  --  102 100* 101*  BUN 12 12.5 14 11   CREATININE 1.0 1.1 0.94 0.82  CALCIUM  --  10.1 9.3 8.7*   GFR: Estimated Creatinine Clearance: 44.8 mL/min (by C-G formula based on SCr of 0.82 mg/dL). Liver Function Tests: Recent Labs  Lab 05/10/17 05/10/17 1142 05/11/17 1815  AST 21 33 42*  ALT 18 24 30   ALKPHOS 124 150 153*  BILITOT  --  0.46 0.8  PROT  --  6.6 5.9*  ALBUMIN  --  3.0* 3.1*   No results for input(s): LIPASE, AMYLASE in the last 168 hours. No results for input(s): AMMONIA in the last 168 hours. Coagulation Profile: Recent Labs  Lab 05/11/17 2352 05/12/17 0819  05/13/17 0520  INR 2.92 2.90 2.70   Cardiac Enzymes: No results for input(s): CKTOTAL, CKMB, CKMBINDEX, TROPONINI in the last 168 hours. BNP (last 3 results) No results for input(s): PROBNP in the last 8760 hours. HbA1C: No results for input(s): HGBA1C in the last 72 hours. CBG: No results for input(s): GLUCAP in the last 168 hours. Lipid Profile: No results for input(s): CHOL, HDL, LDLCALC, TRIG, CHOLHDL, LDLDIRECT in the last 72 hours. Thyroid Function Tests: No results for input(s): TSH, T4TOTAL, FREET4, T3FREE, THYROIDAB in the last 72 hours. Anemia Panel: No results for input(s): VITAMINB12, FOLATE, FERRITIN, TIBC, IRON, RETICCTPCT in the last 72 hours. Sepsis Labs: Recent Labs  Lab 05/11/17 1815 05/11/17 1832  PROCALCITON <0.10  --   LATICACIDVEN  --  0.83    Recent Results (from the past 240 hour(s))  Blood Culture (routine x 2)     Status: None (Preliminary result)   Collection Time: 05/11/17  6:15 PM  Result Value Ref Range Status   Specimen Description BLOOD LEFT FOREARM  Final   Special Requests   Final    IN PEDIATRIC BOTTLE Blood Culture results may not be optimal due to an excessive volume of blood received in culture bottles   Culture   Final    NO GROWTH 2 DAYS Performed at [facility], [place] [address] [place], [place] [address]    Report Status PENDING  Incomplete  Blood Culture (routine x 2)     Status: None (Preliminary result)   Collection Time: 05/11/17  6:39 PM  Result Value Ref Range Status   Specimen Description BLOOD RIGHT FOREARM  Final   Special Requests   Final    BOTTLES DRAWN AEROBIC AND ANAEROBIC Blood Culture adequate volume   Culture   Final    NO GROWTH 2 DAYS Performed at [facility], [place] [address], [place], [place] [address]    Report Status PENDING  Incomplete  MRSA PCR Screening     Status: Abnormal   Collection Time: 05/11/17 10:57 PM  Result Value Ref Range Status   MRSA by PCR POSITIVE (A) NEGATIVE Final     Comment:        The GeneXpert MRSA Assay (FDA approved for NASAL specimens only), is one component of a comprehensive MRSA colonization surveillance program. It is not intended to diagnose MRSA infection nor to guide or monitor treatment for MRSA infections. RESULT CALLED TO, READ BACK BY AND VERIFIED WITH: MCINTOSH,R RN [phone] [id number] COV[place],N   Culture, sputum-assessment     Status: None   Collection Time: 05/12/17  7:40  AM  Result Value Ref Range Status   Specimen Description EXPECTORATED SPUTUM  Final   Special Requests NONE  Final   Sputum evaluation THIS SPECIMEN IS ACCEPTABLE FOR SPUTUM CULTURE  Final   Report Status 05/12/2017 FINAL  Final  Culture, respiratory (NON-Expectorated)     Status: None (Preliminary result)   Collection Time: 05/12/17  7:40 AM  Result Value Ref Range Status   Specimen Description EXPECTORATED SPUTUM  Final   Special Requests NONE Reflexed from [id number]  Final   Gram Stain   Final    ABUNDANT WBC PRESENT, PREDOMINANTLY PMN RARE SQUAMOUS EPITHELIAL CELLS PRESENT FEW GRAM POSITIVE RODS RARE GRAM POSITIVE COCCI IN PAIRS    Culture   Final    CULTURE REINCUBATED FOR BETTER GROWTH Performed at [facility], [place] [address], [place], [place] [address]    Report Status PENDING  Incomplete         Radiology Studies: Dg Chest 2 View  Result Date: 05/11/2017 CLINICAL DATA:  Initial evaluation for acute shortness of breath and fever. EXAM: CHEST  2 VIEW COMPARISON:  Prior radiograph from 02/01/2017. FINDINGS: Cardiomegaly, stable from previous. Mediastinal silhouette within normal limits. Aortic atherosclerosis. Lungs are hypoinflated. Patchy left lower lobe infiltrate, concerning for pneumonia given provided history. Streaky right basilar atelectasis. Perihilar vascular congestion without overt pulmonary edema. No pleural effusion. No pneumothorax. No acute osseus abnormality. Degenerative changes about the left shoulder. IMPRESSION: 1. Patchy  left lower lobe infiltrate, concerning for pneumonia given provided history. 2. Stable cardiomegaly with mild perihilar vascular congestion without frank pulmonary edema. Electronically Signed   By: Jeannine Boga M.D.   On: 05/11/2017 19:43        Scheduled Meds: . Chlorhexidine Gluconate Cloth  6 each Topical Q0600  . folic acid  1 mg Oral Daily  . gabapentin  100 mg Oral TID  . guaiFENesin  600 mg Oral BID  . latanoprost  1 drop Both Eyes QHS  . LORazepam  0.5 mg Oral QHS  . mometasone-formoterol  2 puff Inhalation BID  . mupirocin ointment  1 application Nasal BID  . oxybutynin  5 mg Oral BID  . pantoprazole  40 mg Oral Daily  . timolol  1 drop Left Eye Daily  . warfarin  3 mg Oral Once per day on Sun Mon Wed Thu Fri Sat  . [START ON 05/15/2017] warfarin  6 mg Oral Once per day on Tue  . Warfarin - Pharmacist Dosing Inpatient   Does not apply q1800   Continuous Infusions: . azithromycin Stopped (05/12/17 1842)  . cefTRIAXone (ROCEPHIN)  IV Stopped (05/12/17 1700)     LOS: 2 days    Time spent: 35 minutes.     Hosie Poisson, MD [facility] Pager [phone]  If 7PM-7AM, please contact night-coverage [email or web address] Password [email or web address] 05/13/2017, 3:25 PM

## 2017-05-13 NOTE — Evaluation (Signed)
Clinical/Bedside Swallow Evaluation Patient Details  Name: [name] MRN: [id number] Date of Birth: 1933/01/06  Today's Date: 05/13/2017 Time: SLP Start Time (ACUTE ONLY): 53 SLP Stop Time (ACUTE ONLY): 1435 SLP Time Calculation (min) (ACUTE ONLY): 20 min  Past Medical History:  Past Medical History:  Diagnosis Date  . Acute respiratory failure with hypoxia ([place])   . Anemia   . Aspergillosis ([place])   . Bladder incontinence   . Blindness of one eye   . Cancer ([place])    melanoma   ([place])  . Chronic sinusitis   . Clotting disorder ([place])   . Difficult intubation    Difficult airway with intubation for ARF 07/22/14 (due to anterior larynx, also had mucous plug)  . Fatigue 12/09/2015  . Glaucoma   . Hearing loss in left ear    hearing aid both ears  . History of pulmonary embolism 1997  . Interstitial emphysema ([place])   . Left shoulder pain 12/09/2015  . Multiple allergies   . Neuropathy   . OA (osteoarthritis)   . Pneumonia 12/15  . Shortness of breath dyspnea   . Stroke [facility])    ??? blind left eye  . Wegener's granulomatosis ([place])    Past Surgical History:  Past Surgical History:  Procedure Laterality Date  . bronchosocpy     pt. states she has had over 10 in last 20 yrs.  Marland Kitchen CATARACT EXTRACTION    . KNEE ARTHROSCOPY  2006   rt   . MELANOMA EXCISION     left leg  . PUBOVAGINAL SLING    . TEAR DUCT PROBING    . TUBAL LIGATION    . TYMPANOMASTOIDECTOMY Left 04/28/2015  . VENA CAVA FILTER PLACEMENT  1997   [place] filter   HPI:  83 yo female adm to [facility] with sepsis.  Pt with PMH = for Wegener's, prior smoker quit 40 years ago, hearing loss, PE hx.  Pt CXR concerning for left lower lobe pna - She also has h/o possible fungal sinusitis in January 2016 and sees ENT (Dr Lucia Gaskins for follow up every six months).  Swallow evaluation ordered.     Assessment / Plan / Recommendation Clinical Impression  Pt with functional oropharyngeal swallow ability based on clinical  swallow evaluation.  She does not have any s/s of aspiration/penetration with po observed.  Pt passed the Yale protocol - 3 ounce water test. Cranial nerve exam negative and pt denies dysphagia.  Pt does have h/o chronic sinusitis and ? fungal involvement 07/2014 - for which she sees Dr Lucia Gaskins ENT every six months.  Question if sinusitis may contribute to pulmonary issues.  Recommend continue regular/thin diet.  No SlP follow up indicated.       Aspiration Risk  Mild aspiration risk    Diet Recommendation Regular;Thin liquid   Liquid Administration via: Cup;Straw Medication Administration: Whole meds with liquid Supervision: Patient able to self feed Compensations: Slow rate;Small sips/bites Postural Changes: Seated upright at 90 degrees    Other  Recommendations Oral Care Recommendations: Oral care BID   Follow up Recommendations None      Frequency and Duration     n/a       Prognosis   n/a     Swallow Study   General Date of Onset: 05/13/17 HPI: 83 yo female adm to [facility] with sepsis.  Pt with PMH = for Wegener's, prior smoker quit 40 years ago, hearing loss, PE hx.  Pt CXR concerning for left lower lobe  pna - She also has h/o possible fungal sinusitis in January 2016 and sees ENT (Dr Lucia Gaskins for follow up every six months).  Swallow evaluation ordered.   Type of Study: Bedside Swallow Evaluation Diet Prior to this Study: Regular;Thin liquids Temperature Spikes Noted: No Respiratory Status: Room air History of Recent Intubation: No Behavior/Cognition: Alert;Cooperative;Pleasant mood Oral Cavity Assessment: Within Functional Limits Oral Care Completed by SLP: No Oral Cavity - Dentition: Adequate natural dentition Vision: Functional for self-feeding Self-Feeding Abilities: Able to feed self Patient Positioning: Upright in bed Baseline Vocal Quality: Normal Volitional Cough: Strong Volitional Swallow: Able to elicit    Oral/Motor/Sensory Function Overall Oral Motor/Sensory  Function: Within functional limits   Ice Chips Ice chips: Not tested   Thin Liquid Thin Liquid: Within functional limits Presentation: Self Fed;Straw    Nectar Thick Nectar Thick Liquid: Not tested   Honey Thick Honey Thick Liquid: Not tested   Puree Puree: Not tested   Solid   GO   Solid: Within functional limits        Macario Golds 05/13/2017,2:53 PM  Luanna Salk, [place] [facility] SLP [phone]

## 2017-05-13 NOTE — Progress Notes (Signed)
ANTICOAGULATION CONSULT NOTE - Follow Up  Pharmacy Consult for warfarin Indication: Hx of PE  Allergies  Allergen Reactions  . Adhesive [Tape] Rash    On on lower extremities    Patient Measurements: Height: 5\' 1"  (154.9 cm) Weight: 137 lb 2 oz (62.2 kg) IBW/kg (Calculated) : 47.8  Labs: Recent Labs    05/10/17 1142 05/11/17 1815 05/11/17 2352 05/12/17 0328 05/12/17 0819 05/13/17 0520  HGB 9.5* 8.5*  --  8.2*  --   --   HCT 28.0* 24.5*  --  24.2*  --   --   PLT 327 327  --  296  --   --   LABPROT  --   --  30.2*  --  30.1* 28.4*  INR  --   --  2.92  --  2.90 2.70  CREATININE 1.1 0.94  --  0.82  --   --    Estimated Creatinine Clearance: 44.8 mL/min (by C-G formula based on SCr of 0.82 mg/dL).  Medications:   Scheduled:  . Chlorhexidine Gluconate Cloth  6 each Topical Q0600  . folic acid  1 mg Oral Daily  . gabapentin  100 mg Oral TID  . guaiFENesin  600 mg Oral BID  . latanoprost  1 drop Both Eyes QHS  . LORazepam  0.5 mg Oral QHS  . mometasone-formoterol  2 puff Inhalation BID  . mupirocin ointment  1 application Nasal BID  . oxybutynin  5 mg Oral BID  . pantoprazole  40 mg Oral Daily  . timolol  1 drop Left Eye Daily  . warfarin  3 mg Oral Once per day on Sun Mon Wed Thu Fri Sat  . [START ON 05/15/2017] warfarin  6 mg Oral Once per day on Tue  . Warfarin - Pharmacist Dosing Inpatient   Does not apply q1800   Assessment: 39 yoF from SNF with SHOB, productive cough, fever,chills. CTX/Azith per Rx for CAP. Pharmacy consulted to continue warfarin dosing while inpatient.  PMH: Wegener's granulomatosis - Cyclophosphamide Hx PE: chronic Warfarin 3mg  daily except 6mg  Tu--LD 11/8. Admitting INR 2.92  Today, 05/13/2017  INR 2.7, therapeutic  Hgb 8.2 yesterday, hx anemia  Home Warfarin regimen resumed, will adjust if necessary  Cardiac diet ordered - no intake recorded  Antibiotics (ceftriaxone/azithromycin) may increase INR  Goal of Therapy:  INR 2-3    Plan:  Daily INR Continue home warfarin dose at this time.    Peggyann Juba, PharmD, BCPS Pager: [phone] 05/13/2017, 9:53 AM

## 2017-05-14 ENCOUNTER — Inpatient Hospital Stay (HOSPITAL_COMMUNITY): Payer: Medicare Other

## 2017-05-14 DIAGNOSIS — M7989 Other specified soft tissue disorders: Secondary | ICD-10-CM

## 2017-05-14 DIAGNOSIS — M79609 Pain in unspecified limb: Secondary | ICD-10-CM

## 2017-05-14 DIAGNOSIS — J189 Pneumonia, unspecified organism: Secondary | ICD-10-CM

## 2017-05-14 DIAGNOSIS — J45909 Unspecified asthma, uncomplicated: Secondary | ICD-10-CM

## 2017-05-14 LAB — LEGIONELLA PNEUMOPHILA SEROGP 1 UR AG: L. PNEUMOPHILA SEROGP 1 UR AG: NEGATIVE

## 2017-05-14 LAB — PROTIME-INR
INR: 2.83
Prothrombin Time: 29.5 seconds — ABNORMAL HIGH (ref 11.4–15.2)

## 2017-05-14 MED ORDER — SALINE SPRAY 0.65 % NA SOLN
1.0000 | NASAL | Status: DC | PRN
  Filled 2017-05-14: qty 44

## 2017-05-14 MED ORDER — IPRATROPIUM BROMIDE 0.02 % IN SOLN
0.5000 mg | Freq: Three times a day (TID) | RESPIRATORY_TRACT | Status: DC
  Administered 2017-05-15 (×2): 0.5 mg via RESPIRATORY_TRACT
  Filled 2017-05-14 (×2): qty 2.5

## 2017-05-14 MED ORDER — METOPROLOL TARTRATE 5 MG/5ML IV SOLN
5.0000 mg | Freq: Once | INTRAVENOUS | Status: AC
  Administered 2017-05-14: 5 mg via INTRAVENOUS
  Filled 2017-05-14: qty 5

## 2017-05-14 MED ORDER — LEVALBUTEROL HCL 0.63 MG/3ML IN NEBU: 0.6300 mg | INHALATION_SOLUTION | Freq: Four times a day (QID) | RESPIRATORY_TRACT | Status: DC | PRN

## 2017-05-14 MED ORDER — TRAZODONE HCL 50 MG PO TABS
50.0000 mg | ORAL_TABLET | Freq: Every day | ORAL | Status: DC
  Administered 2017-05-14: 50 mg via ORAL
  Filled 2017-05-14: qty 1

## 2017-05-14 MED ORDER — LEVALBUTEROL HCL 0.63 MG/3ML IN NEBU
0.6300 mg | INHALATION_SOLUTION | Freq: Three times a day (TID) | RESPIRATORY_TRACT | Status: DC
  Administered 2017-05-15 (×2): 0.63 mg via RESPIRATORY_TRACT
  Filled 2017-05-14 (×2): qty 3

## 2017-05-14 MED ORDER — IPRATROPIUM BROMIDE 0.02 % IN SOLN
0.5000 mg | Freq: Four times a day (QID) | RESPIRATORY_TRACT | Status: DC
  Administered 2017-05-14 (×2): 0.5 mg via RESPIRATORY_TRACT
  Filled 2017-05-14 (×2): qty 2.5

## 2017-05-14 MED ORDER — GUAIFENESIN-DM 100-10 MG/5ML PO SYRP
5.0000 mL | ORAL_SOLUTION | ORAL | Status: DC | PRN
  Administered 2017-05-14 (×2): 5 mL via ORAL
  Filled 2017-05-14 (×2): qty 10

## 2017-05-14 MED ORDER — LEVALBUTEROL HCL 0.63 MG/3ML IN NEBU
0.6300 mg | INHALATION_SOLUTION | Freq: Four times a day (QID) | RESPIRATORY_TRACT | Status: DC
  Administered 2017-05-14 (×2): 0.63 mg via RESPIRATORY_TRACT
  Filled 2017-05-14 (×2): qty 3

## 2017-05-14 NOTE — Progress Notes (Signed)
Pt. HR sustaining in 135-140's. Pt. Resting in bed, not c/o of symptoms. On call MD [facility] [place] paged and made aware. Awaiting any new orders and will continue to monitor pt.

## 2017-05-14 NOTE — Progress Notes (Signed)
Right lower extremity venous duplex completed. No obvious evidence of an acute DVT superficial thrombosis or Baker's cyst. There appears to be a chronic thrombus in the mid femoral vein. Unable to visualize the distal femoral vein due to size.  [place] Yaniah Thiemann,RVS 05/15/2107 12:03 PM

## 2017-05-14 NOTE — Evaluation (Signed)
Physical Therapy Evaluation Patient Details Name: KAYLIEE ATIENZA MRN: [id number] DOB: 12-Jul-1932 Today's Date: 05/14/2017   History of Present Illness  CARLOYN LAHUE is a 83 y.o. female with history of Wegener's granulomatosis, anemia, history of PE, scleritis presents to the ER 05/11/17 because of shortness of breath and productive cough and fever chills. She was found to have left lobar pneumonia. 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 Patient reports  Resides at [facility]. Pt admitted with above diagnosis. Pt currently with functional limitations due to the deficits listed below (see PT Problem List).  Pt will benefit from skilled PT to increase their independence and safety with mobility to allow discharge to the venue listed below.      Follow Up Recommendations SNF(vs. HHPT if returns to ALF)    Equipment Recommendations  None recommended by PT    Recommendations for Other Services       Precautions / Restrictions Precautions Precautions: Fall Precaution Comments: monitor O2 sats, incontinence      Mobility  Bed Mobility Overal bed mobility: Needs Assistance Bed Mobility: Supine to Sit     Supine to sit: Supervision     General bed mobility comments: extra time to push up to sitting  Transfers Overall transfer level: Needs assistance Equipment used: Rolling walker (2 wheeled) Transfers: Sit to/from Stand Sit to Stand: Min assist         General transfer comment: steady assist to rise.   Ambulation/Gait Ambulation/Gait assistance: Min assist Ambulation Distance (Feet): 5 Feet Assistive device: Rolling walker (2 wheeled) Gait Pattern/deviations: Step-to pattern     General Gait Details: from bed to recliner, steady assist to turn.  Stairs            Wheelchair Mobility    Modified Rankin (Stroke Patients Only)       Balance Overall balance  assessment: Needs assistance Sitting-balance support: Feet supported;Bilateral upper extremity supported Sitting balance-Leahy Scale: Fair     Standing balance support: Bilateral upper extremity supported;During functional activity Standing balance-Leahy Scale: Poor                               Pertinent Vitals/Pain Pain Assessment: No/denies pain Faces Pain Scale: No hurt    Home Living Family/patient expects to be discharged to:: Assisted living               Home Equipment: [employer] trader - single point      Prior Function Level of Independence: Needs assistance   Gait / Transfers Assistance Needed: uses scooter to meals           Hand Dominance        Extremity/Trunk Assessment   Upper Extremity Assessment Upper Extremity Assessment: Overall WFL for tasks assessed    Lower Extremity Assessment Lower Extremity Assessment: Generalized weakness    Cervical / Trunk Assessment Cervical / Trunk Assessment: Normal  Communication   Communication: HOH  Cognition Arousal/Alertness: Awake/alert Behavior During Therapy: WFL for tasks assessed/performed Overall Cognitive Status: Within Functional Limits for tasks assessed Area of Impairment: Orientation                 Orientation Level: Time                    General Comments      Exercises  Assessment/Plan    PT Assessment Patient needs continued PT services  PT Problem List Decreased strength;Decreased range of motion;Decreased knowledge of use of DME;Decreased activity tolerance;Decreased safety awareness;Decreased knowledge of precautions;Decreased mobility;Decreased balance;Cardiopulmonary status limiting activity       PT Treatment Interventions DME instruction;Gait training;Functional mobility training;Therapeutic activities;Therapeutic exercise;Patient/family education    PT Goals (Current goals can be found in the Care Plan section)  Acute Rehab PT  Goals Patient Stated Goal: agreed to getting up PT Goal Formulation: With patient Time For Goal Achievement: 05/14/17    Frequency Min 2X/week   Barriers to discharge        Co-evaluation               AM-PAC PT "6 Clicks" Daily Activity  Outcome Measure Difficulty turning over in bed (including adjusting bedclothes, sheets and blankets)?: A Little Difficulty moving from lying on back to sitting on the side of the bed? : A Little Difficulty sitting down on and standing up from a chair with arms (e.g., wheelchair, bedside commode, etc,.)?: Unable Help needed moving to and from a bed to chair (including a wheelchair)?: Total Help needed walking in hospital room?: Total Help needed climbing 3-5 steps with a railing? : Total 6 Click Score: 10    End of Session Equipment Utilized During Treatment: Gait belt Activity Tolerance: Patient tolerated treatment well Patient left: in chair;with call bell/phone within reach;with chair alarm set Nurse Communication: Mobility status PT Visit Diagnosis: Difficulty in walking, not elsewhere classified (R26.2)    Time: 8315-1761 PT Time Calculation (min) (ACUTE ONLY): 15 min   Charges:   PT Evaluation $PT Eval Low Complexity: 1 Low     PT G CodesClaretha Cooper 05/14/2017, 4:20 PM Tresa Endo PT [phone]

## 2017-05-14 NOTE — Progress Notes (Signed)
PT Cancellation Note  Patient Details Name: [name] MRN: [id number] DOB: 1933/01/28   Cancelled Treatment:    Reason Eval/Treat Not Completed: Medical issues which prohibited therapy(dopplers pending. will follow. )   Philomena Doheny 05/14/2017, 11:12 AM [phone]

## 2017-05-14 NOTE — Progress Notes (Signed)
EKG obtained. BP 130/73 with HR of 136. IV metoprolol 5 mg given. MD Hal Hope up on floor to see patient. Will continue to monitor pt. Closely

## 2017-05-14 NOTE — Progress Notes (Signed)
PROGRESS NOTE    [name]  FBP:[id number] DOB: 1932-08-04 DOA: 05/11/2017 PCP: Blanchie Serve, MD    Brief Narrative: [name] is a 83 y.o. female with history of Wegener's granulomatosis, anemia, history of PE, scleritis presents to the ER because of shortness of breath and productive cough and fever chills. She was found to have left lobar pneumonia. She was admitted to medical service for management of sepsis and pneumonia.     Assessment & Plan:   Principal Problem:   Sepsis ([facility]) Active Problems:   WEGENERS GRANULOMATOSIS   Intrinsic asthma   Pulmonary embolism (HCC)   Chronic anemia   Sepsis from left lobar pneumonia:  Pt reports her coughing is better, but her breathing is not back to baseline.  Weaned her off the oxygen.  BP have improved, influenza PCR is negative.  Urine for strep antigen is negative and legionella anti gen is pending.  Blood cultures negative, sputum cultures show mod staph, and await sensitivities.  Resume IV rocephin and zithromax . Resume IV fluids.  Lactic acid normalized.  Pro calcitonin negative.  CXR shows left basilar pneumonia.   H/o wegener's granulomatosis:  - cytoxan on hold for the ongoing infection.    Asthma:  Wheezing has improved.    H/o PE: On coumadin. INR therapeutic.    Macrocytic anemia:  Hemoglobin stable around 8.  anemia panel. Shows elevated ferritin. Iron is 48.   RIGHT lower extremity swelling.  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  Already on coumadin and INR is therapeutic.    DVT prophylaxis: lovenox.  Code Status: full code.  Family Communication: daughter  at bedside.  Disposition Plan:SNF possibly in am.    Consultants:   None.   Procedures: none.   Antimicrobials:  Rocephin and zithromax. Since admission.    Subjective: Weaned her off the oxygen. Daughter requesting humidifier in the room, nasal spray.   Objective: Vitals:   05/13/17 2141  05/14/17 0556 05/14/17 0754 05/14/17 1326  BP: 114/73 (!) 131/58  (!) 108/50  Pulse: 97 85  91  Resp:  18  18  Temp:  98.3 F (36.8 C)  98.1 F (36.7 C)  TempSrc:  Oral  Oral  SpO2:  96% 95% 94%  Weight:  62.7 kg (138 lb 3.7 oz)    Height:        Intake/Output Summary (Last 24 hours) at 05/14/2017 1629 Last data filed at 05/14/2017 1335 Gross per 24 hour  Intake 300 ml  Output 2650 ml  Net -2350 ml   Filed Weights   05/12/17 0500 05/13/17 0551 05/14/17 0556  Weight: 61.8 kg (136 lb 3.9 oz) 62.2 kg (137 lb 2 oz) 62.7 kg (138 lb 3.7 oz)    Examination:  General exam: Appears calm and comfortable, weaned her off the oxygen.  Respiratory system:  Respiratory effort normal.  Scattered rhonchi and wheezing.  Cardiovascular system: S1 & S2 heard, RRR. No JVD, murmurs, No pedal edema. Gastrointestinal system: Abdomen is soft NT ND BS+  Central nervous system: Alert and oriented. Non focal.  Extremities: Symmetric 5 x 5 power.no cyanosis, clubbing,  Skin: No rashes, lesions or ulcers Psychiatry:Mood & affect appropriate.     Data Reviewed: I have personally reviewed following labs and imaging studies  CBC: Recent Labs  Lab 05/10/17 05/10/17 1142 05/11/17 1815 05/12/17 0328  WBC 2.5 3.1* 4.4 4.2  NEUTROABS  --  2.4 3.6  --   HGB 8.0* 9.5* 8.5*  8.2*  HCT 23* 28.0* 24.5* 24.2*  MCV  --  110* 105.6* 106.6*  PLT 305 327 327 366   Basic Metabolic Panel: Recent Labs  Lab 05/10/17 05/10/17 1142 05/11/17 1815 05/12/17 0328  NA 137 137 134* 141  K 3.7 3.9 3.9 3.5  CL  --   --  105 113*  CO2  --  22 20* 22  GLUCOSE  --  102 100* 101*  BUN 12 12.5 14 11   CREATININE 1.0 1.1 0.94 0.82  CALCIUM  --  10.1 9.3 8.7*   GFR: Estimated Creatinine Clearance: 44.9 mL/min (by C-G formula based on SCr of 0.82 mg/dL). Liver Function Tests: Recent Labs  Lab 05/10/17 05/10/17 1142 05/11/17 1815  AST 21 33 42*  ALT 18 24 30   ALKPHOS 124 150 153*  BILITOT  --  0.46 0.8  PROT   --  6.6 5.9*  ALBUMIN  --  3.0* 3.1*   No results for input(s): LIPASE, AMYLASE in the last 168 hours. No results for input(s): AMMONIA in the last 168 hours. Coagulation Profile: Recent Labs  Lab 05/11/17 2352 05/12/17 0819 05/13/17 0520 05/14/17 0540  INR 2.92 2.90 2.70 2.83   Cardiac Enzymes: No results for input(s): CKTOTAL, CKMB, CKMBINDEX, TROPONINI in the last 168 hours. BNP (last 3 results) No results for input(s): PROBNP in the last 8760 hours. HbA1C: No results for input(s): HGBA1C in the last 72 hours. CBG: No results for input(s): GLUCAP in the last 168 hours. Lipid Profile: No results for input(s): CHOL, HDL, LDLCALC, TRIG, CHOLHDL, LDLDIRECT in the last 72 hours. Thyroid Function Tests: No results for input(s): TSH, T4TOTAL, FREET4, T3FREE, THYROIDAB in the last 72 hours. Anemia Panel: No results for input(s): VITAMINB12, FOLATE, FERRITIN, TIBC, IRON, RETICCTPCT in the last 72 hours. Sepsis Labs: Recent Labs  Lab 05/11/17 1815 05/11/17 1832  PROCALCITON <0.10  --   LATICACIDVEN  --  0.83    Recent Results (from the past 240 hour(s))  Blood Culture (routine x 2)     Status: None (Preliminary result)   Collection Time: 05/11/17  6:15 PM  Result Value Ref Range Status   Specimen Description BLOOD LEFT FOREARM  Final   Special Requests   Final    IN PEDIATRIC BOTTLE Blood Culture results may not be optimal due to an excessive volume of blood received in culture bottles   Culture   Final    NO GROWTH 3 DAYS Performed at [facility], [place] [address], [place], [place] [address]    Report Status PENDING  Incomplete  Blood Culture (routine x 2)     Status: None (Preliminary result)   Collection Time: 05/11/17  6:39 PM  Result Value Ref Range Status   Specimen Description BLOOD RIGHT FOREARM  Final   Special Requests   Final    BOTTLES DRAWN AEROBIC AND ANAEROBIC Blood Culture adequate volume   Culture   Final    NO GROWTH 3 DAYS Performed at [facility], [place] [address], [place], [place] [address]    Report Status PENDING  Incomplete  MRSA PCR Screening     Status: Abnormal   Collection Time: 05/11/17 10:57 PM  Result Value Ref Range Status   MRSA by PCR POSITIVE (A) NEGATIVE Final    Comment:        The GeneXpert MRSA Assay (FDA approved for NASAL specimens only), is one component of a comprehensive MRSA colonization surveillance program. It is not intended  to diagnose MRSA infection nor to guide or monitor treatment for MRSA infections. RESULT CALLED TO, READ BACK BY AND VERIFIED WITH: MCINTOSH,R RN [phone] [id number] COVINGTON,N   Culture, sputum-assessment     Status: None   Collection Time: 05/12/17  7:40 AM  Result Value Ref Range Status   Specimen Description EXPECTORATED SPUTUM  Final   Special Requests NONE  Final   Sputum evaluation THIS SPECIMEN IS ACCEPTABLE FOR SPUTUM CULTURE  Final   Report Status 05/12/2017 FINAL  Final  Culture, respiratory (NON-Expectorated)     Status: None (Preliminary result)   Collection Time: 05/12/17  7:40 AM  Result Value Ref Range Status   Specimen Description EXPECTORATED SPUTUM  Final   Special Requests NONE Reflexed from [id number]  Final   Gram Stain   Final    ABUNDANT WBC PRESENT, PREDOMINANTLY PMN RARE SQUAMOUS EPITHELIAL CELLS PRESENT FEW GRAM POSITIVE RODS RARE GRAM POSITIVE COCCI IN PAIRS    Culture   Final    MODERATE STAPHYLOCOCCUS AUREUS SUSCEPTIBILITIES TO FOLLOW Performed at [facility], [place] [address] [place], [place] [address]    Report Status PENDING  Incomplete         Radiology Studies: Dg Chest 2 View  Result Date: 05/14/2017 CLINICAL DATA:  Productive cough. History of asthma, bronchitis, previous episodes of pneumonia, remote history of smoking. EXAM: CHEST  2 VIEW COMPARISON:  Chest x-ray of May 11, 2017 FINDINGS: The right lung is adequately inflated. There is no focal infiltrate. On the left there is patchy increased density at the  base with partial obscuration of the hemidiaphragm. The heart is normal in size. The pulmonary vascularity is not engorged. There is calcification in the wall of the thoracic aorta. The bony thorax exhibits no acute abnormality. IMPRESSION: Left basilar atelectasis or pneumonia. Underlying chronic bronchitic changes. Followup PA and lateral chest X-ray is recommended in 3-4 weeks following trial of antibiotic therapy to ensure resolution and exclude underlying malignancy. Thoracic aortic atherosclerosis. Electronically Signed   By: David  Martinique M.D.   On: 05/14/2017 13:24        Scheduled Meds: . Chlorhexidine Gluconate Cloth  6 each Topical Q0600  . folic acid  1 mg Oral Daily  . gabapentin  100 mg Oral TID  . ipratropium  0.5 mg Nebulization Q6H  . latanoprost  1 drop Both Eyes QHS  . levalbuterol  0.63 mg Nebulization Q6H  . LORazepam  0.5 mg Oral QHS  . mometasone-formoterol  2 puff Inhalation BID  . mupirocin ointment  1 application Nasal BID  . oxybutynin  5 mg Oral BID  . pantoprazole  40 mg Oral Daily  . timolol  1 drop Left Eye Daily  . traZODone  50 mg Oral QHS  . warfarin  3 mg Oral Once per day on Sun Mon Wed Thu Fri Sat  . [START ON 05/15/2017] warfarin  6 mg Oral Once per day on Tue  . Warfarin - Pharmacist Dosing Inpatient   Does not apply q1800   Continuous Infusions: . azithromycin Stopped (05/13/17 1807)  . cefTRIAXone (ROCEPHIN)  IV Stopped (05/13/17 1726)     LOS: 3 days    Time spent: 35 minutes.     Hosie Poisson, MD [facility] Pager [phone]  If 7PM-7AM, please contact night-coverage [email or web address] Password [facility] 05/14/2017, 4:29 PM

## 2017-05-14 NOTE — Progress Notes (Signed)
ANTICOAGULATION CONSULT NOTE - Follow Up  Pharmacy Consult for warfarin Indication: Hx of PE  Allergies  Allergen Reactions  . Adhesive [Tape] Rash    On on lower extremities    Patient Measurements: Height: 5\' 1"  (154.9 cm) Weight: 138 lb 3.7 oz (62.7 kg) IBW/kg (Calculated) : 47.8  Labs: Recent Labs    05/11/17 1815  05/12/17 0328 05/12/17 0819 05/13/17 0520 05/14/17 0540  HGB 8.5*  --  8.2*  --   --   --   HCT 24.5*  --  24.2*  --   --   --   PLT 327  --  296  --   --   --   LABPROT  --    < >  --  30.1* 28.4* 29.5*  INR  --    < >  --  2.90 2.70 2.83  CREATININE 0.94  --  0.82  --   --   --    < > = values in this interval not displayed.   Estimated Creatinine Clearance: 44.9 mL/min (by C-G formula based on SCr of 0.82 mg/dL).  Medications:   Scheduled:  . Chlorhexidine Gluconate Cloth  6 each Topical Q0600  . folic acid  1 mg Oral Daily  . gabapentin  100 mg Oral TID  . guaiFENesin  600 mg Oral BID  . latanoprost  1 drop Both Eyes QHS  . LORazepam  0.5 mg Oral QHS  . mometasone-formoterol  2 puff Inhalation BID  . mupirocin ointment  1 application Nasal BID  . oxybutynin  5 mg Oral BID  . pantoprazole  40 mg Oral Daily  . timolol  1 drop Left Eye Daily  . warfarin  3 mg Oral Once per day on Sun Mon Wed Thu Fri Sat  . [START ON 05/15/2017] warfarin  6 mg Oral Once per day on Tue  . Warfarin - Pharmacist Dosing Inpatient   Does not apply q1800   Assessment: 31 yoF from SNF with SHOB, productive cough, fever,chills. CTX/Azith per Rx for CAP. Pharmacy consulted to continue warfarin dosing while inpatient.  PMH: Wegener's granulomatosis - Cyclophosphamide Hx PE: chronic Warfarin 3mg  daily except 6mg  Tu--LD 11/8. Admitting INR 2.92  Today, 05/14/2017  INR 2.83, remains therapeutic  Hgb 8.2 (last CBC on 11/10), hx anemia  No bleeding or complications reported  Cardiac diet ordered; intake not recorded  Drug-drug interactions:  Antibiotics  (ceftriaxone/azithromycin) may increase INR  Pt reports burning pain in RLE - duplex pending on 11/12  Goal of Therapy:  INR 2-3   Plan:  Continue home warfarin dose (3 mg daily, except 6mg  on Tuesdays) Daily INR Follow up results of LE ultrasound  Gretta Arab PharmD, BCPS Pager [phone] 05/14/2017 7:58 AM

## 2017-05-15 DIAGNOSIS — J189 Pneumonia, unspecified organism: Secondary | ICD-10-CM

## 2017-05-15 LAB — CBC WITH DIFFERENTIAL/PLATELET
BASOS ABS: 0 10*3/uL (ref 0.0–0.1)
Basophils Relative: 1 %
Eosinophils Absolute: 0.1 10*3/uL (ref 0.0–0.7)
Eosinophils Relative: 2 %
HCT: 25.6 % — ABNORMAL LOW (ref 36.0–46.0)
Hemoglobin: 8.4 g/dL — ABNORMAL LOW (ref 12.0–15.0)
LYMPHS PCT: 5 %
Lymphs Abs: 0.2 10*3/uL — ABNORMAL LOW (ref 0.7–4.0)
MCH: 35.4 pg — ABNORMAL HIGH (ref 26.0–34.0)
MCHC: 32.8 g/dL (ref 30.0–36.0)
MCV: 108 fL — AB (ref 78.0–100.0)
Monocytes Absolute: 0.5 10*3/uL (ref 0.1–1.0)
Monocytes Relative: 13 %
NEUTROS ABS: 2.9 10*3/uL (ref 1.7–7.7)
NEUTROS PCT: 79 %
Platelets: 326 10*3/uL (ref 150–400)
RBC: 2.37 MIL/uL — AB (ref 3.87–5.11)
RDW: 18.4 % — ABNORMAL HIGH (ref 11.5–15.5)
WBC: 3.8 10*3/uL — AB (ref 4.0–10.5)

## 2017-05-15 LAB — CULTURE, RESPIRATORY

## 2017-05-15 LAB — BASIC METABOLIC PANEL
ANION GAP: 4 — AB (ref 5–15)
BUN: 10 mg/dL (ref 6–20)
CO2: 25 mmol/L (ref 22–32)
Calcium: 8.9 mg/dL (ref 8.9–10.3)
Chloride: 110 mmol/L (ref 101–111)
Creatinine, Ser: 0.66 mg/dL (ref 0.44–1.00)
GLUCOSE: 121 mg/dL — AB (ref 65–99)
POTASSIUM: 3.1 mmol/L — AB (ref 3.5–5.1)
Sodium: 139 mmol/L (ref 135–145)

## 2017-05-15 LAB — PROTIME-INR
INR: 3.25
Prothrombin Time: 32.9 seconds — ABNORMAL HIGH (ref 11.4–15.2)

## 2017-05-15 LAB — CULTURE, RESPIRATORY W GRAM STAIN

## 2017-05-15 MED ORDER — GUAIFENESIN-DM 100-10 MG/5ML PO SYRP: 5.0000 mL | ORAL_SOLUTION | ORAL | 0 refills | Status: AC | PRN

## 2017-05-15 MED ORDER — WARFARIN SODIUM 6 MG PO TABS: 6.0000 mg | ORAL_TABLET | ORAL | Status: DC

## 2017-05-15 MED ORDER — POTASSIUM CHLORIDE CRYS ER 20 MEQ PO TBCR
40.0000 meq | EXTENDED_RELEASE_TABLET | Freq: Two times a day (BID) | ORAL | Status: DC
  Administered 2017-05-15: 40 meq via ORAL
  Filled 2017-05-15: qty 2

## 2017-05-15 MED ORDER — DOXYCYCLINE HYCLATE 100 MG PO TABS: 100.0000 mg | ORAL_TABLET | Freq: Two times a day (BID) | ORAL | 0 refills | Status: DC

## 2017-05-15 MED ORDER — IPRATROPIUM BROMIDE 0.02 % IN SOLN: 0.5000 mg | Freq: Three times a day (TID) | RESPIRATORY_TRACT | 12 refills | Status: AC

## 2017-05-15 MED ORDER — MUPIROCIN 2 % EX OINT: 1.0000 "application " | TOPICAL_OINTMENT | Freq: Two times a day (BID) | CUTANEOUS | 0 refills | Status: DC

## 2017-05-15 MED ORDER — DOXYCYCLINE HYCLATE 100 MG PO TABS
100.0000 mg | ORAL_TABLET | Freq: Two times a day (BID) | ORAL | Status: DC
  Administered 2017-05-15: 100 mg via ORAL
  Filled 2017-05-15: qty 1

## 2017-05-15 MED ORDER — SALINE SPRAY 0.65 % NA SOLN
1.0000 | Freq: Four times a day (QID) | NASAL | Status: DC
  Administered 2017-05-15 (×2): 1 via NASAL
  Filled 2017-05-15: qty 44

## 2017-05-15 MED ORDER — AZITHROMYCIN 500 MG PO TABS: 500.0000 mg | ORAL_TABLET | Freq: Every day | ORAL | 0 refills | Status: AC
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
## 2017-05-15 NOTE — Discharge Summary (Signed)
Physician Discharge Summary  [name]:[id number] DOB: March 12, 1933 DOA: 05/11/2017  PCP: Blanchie Serve, MD  Admit date: 05/11/2017 Discharge date: 05/15/2017  Admitted From: ALF.  Disposition:  SNF  Recommendations for Outpatient Follow-up:  1. Follow up with PCP in 1-2 weeks 2. Please obtain BMP/CBC in one week Please follow up with Gina repeat CXR in 3 to 4 weeks.  Please resume cytoxan after discussing with rheumatologist.   Discharge Condition:GUARDED.  CODE STATUS: FULL CODE Diet recommendation: Heart Healthy /  Brief/Interim Summary: [name] [name], anemia, history of PE, scleritis presents to the ER because of shortness of breath and productive cough and fever chills. She was found to have left lobar pneumonia. She was admitted to medical service for management of sepsis and pneumonia.     Discharge Diagnoses:  Principal Problem:   Sepsis ([place]) Active Problems:   WEGENERS Mcmillan   Intrinsic asthma   Pulmonary embolism (HCC)   Chronic anemia   CAP (community acquired pneumonia)  Sepsis from left lobar pneumonia:  Pt reports her coughing is better,. Weaned her off the oxygen.  BP have improved, influenza PCR is negative.  Urine for strep antigen is negative and legionella anti gen iis also negative.  Blood cultures negative, sputum cultures show mod MRSA,,sensitive to tetracycline.  She will be discharged on zithromax for 3 more days and 10 days of doxycycline.  Lactic acid normalized.  Pro calcitonin negative.    H/o wegener's Mcmillan:  - cytoxan on hold for the ongoing infection.  Discuss with your Rheumatology  Before resuming the cytoxan.   Asthma:  Wheezing has improved.    H/o PE: On coumadin. INR supra therapeutic.    Macrocytic anemia:  Hemoglobin stable around 8.  anemia panel. Shows elevated ferritin. Iron is 48.   RIGHT lower extremity swelling.  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  Already on coumadin , INR supra therapeutic.     Discharge Instructions  Discharge Instructions    Discharge instructions   Complete by:  As directed    Please follow up with PCP in one week,  Please obtain Gina CXR in 3 to 4 weeks to evaluate for resolution of the pneumonia.     Allergies as of 05/15/2017      Reactions   Adhesive [tape] Rash   On on lower extremities       Medication List    STOP taking these medications   moxifloxacin 400 MG tablet Commonly known as:  AVELOX     TAKE these medications   acetaminophen 650 MG CR tablet Commonly known as:  TYLENOL Take 650 mg by mouth at bedtime. Can also take 650 mg by mouth 2 times daily as needed   azithromycin 500 MG tablet Commonly known as:  ZITHROMAX Take 1 tablet (500 mg total) daily for 3 days by mouth. Take 1 tablet daily for 3 days.   calcium-vitamin D 500-200 MG-UNIT tablet Commonly known as:  OSCAL WITH D Take 1 tablet by mouth daily.   cholecalciferol 1000 units tablet Commonly known as:  VITAMIN D One daily for vitamin D supplement   cyclophosphamide 50 MG tablet Commonly known as:  CYTOXAN Take 50 mg by mouth daily. Give on an empty stomach 1 hour before or 2 hours after meals. Take along with 25 mg tablet to make 75 mg   cyclophosphamide 25 MG tablet Commonly known as:  CYTOXAN Take 25 mg by  mouth daily. Give on an empty stomach 1 hour before or 2 hours after meals. Take along with the 50 mg tablet to make Gina total of 75 mg   diclofenac sodium 1 % Gel Commonly known as:  VOLTAREN Apply 2 g topically 3 (three) times daily. Apply to left shoulder.   doxycycline 100 MG tablet Commonly known as:  VIBRA-TABS Take 1 tablet (100 mg total) every 12 (twelve) hours by mouth.   Fluticasone-Salmeterol 500-50 MCG/DOSE Aepb Commonly known as:  ADVAIR DISKUS INHALE 1 PUFF EVERY 12 HOURS.   folic acid 1 MG tablet Commonly known as:  FOLVITE Take 1  mg by mouth daily.   gabapentin 100 MG capsule Commonly known as:  NEURONTIN Take 100 mg by mouth 3 (three) times daily.   guaiFENesin 600 MG 12 hr tablet Commonly known as:  MUCINEX Take 600 mg by mouth 2 (two) times daily.   guaiFENesin-dextromethorphan 100-10 MG/5ML syrup Commonly known as:  ROBITUSSIN DM Take 5 mLs every 4 (four) hours as needed by mouth for cough.   hydrocortisone cream 1 % Apply 1 application topically 4 (four) times daily as needed for itching (in addition to the scheduled does).   ipratropium 0.02 % nebulizer solution Commonly known as:  ATROVENT Take 2.5 mLs (0.5 mg total) 3 (three) times daily by nebulization.   latanoprost 0.005 % ophthalmic solution Commonly known as:  XALATAN Place 1 drop into both eyes at bedtime.   levalbuterol 0.63 MG/3ML nebulizer solution Commonly known as:  XOPENEX Take 0.63 mg by nebulization every 6 (six) hours as needed for wheezing or shortness of breath.   LORazepam 0.5 MG tablet Commonly known as:  ATIVAN Take 1 tablet (0.5 mg total) by mouth at bedtime.   mupirocin ointment 2 % Commonly known as:  BACTROBAN Place 1 application 2 (two) times daily into the nose.   NAPHCON-Gina 0.025-0.3 % ophthalmic solution Generic drug:  naphazoline-pheniramine 1 drop. One drop in left eye three times Gina day as needed for redness   omeprazole 20 MG capsule Commonly known as:  PRILOSEC Take 20 mg by mouth daily.   oxybutynin 5 MG tablet Commonly known as:  DITROPAN One twice daily to help bladder control   saccharomyces boulardii 250 MG capsule Commonly known as:  FLORASTOR Take 250 mg by mouth daily.   SORE THROAT SPRAY 1.4 % Liqd Generic drug:  phenol Use as directed 1 spray in the mouth or throat as needed for throat irritation / pain.   sulfamethoxazole-trimethoprim 800-160 MG tablet Commonly known as:  BACTRIM DS,SEPTRA DS Take 1 tablet by mouth every Monday, Wednesday, and Friday. HOLD WHILE ON AUGMENTIN, THEN  RESUME AS PREVIOUS   THERAVIM-M Tabs Take 1 tablet by mouth daily.   timolol 0.5 % ophthalmic solution Commonly known as:  BETIMOL Place 1 drop into the left eye daily.   UNABLE TO FIND Med Name: Duke's magic mouthwash. Swish and spit 5 mL QID PRN   warfarin 3 MG tablet Commonly known as:  COUMADIN Take 3 mg by mouth daily.   warfarin 6 MG tablet Commonly known as:  COUMADIN Take 1 tablet (6 mg total) once Gina week by mouth. Start taking on:  05/22/2017      Contact information for after-discharge care    Destination    [facility] .   Service:  Skilled Chiropodist information: [place] [phone]  Allergies  Allergen Reactions  . Adhesive [Tape] Rash    On on lower extremities     Consultations: None.   Procedures/Studies: Dg Chest 2 View  Result Date: 05/14/2017 CLINICAL DATA:  Productive cough. History of asthma, bronchitis, previous episodes of pneumonia, remote history of smoking. EXAM: CHEST  2 VIEW COMPARISON:  Chest x-ray of May 11, 2017 FINDINGS: The right lung is adequately inflated. There is no focal infiltrate. On the left there is patchy increased density at the base with partial obscuration of the hemidiaphragm. The heart is normal in size. The pulmonary vascularity is not engorged. There is calcification in the wall of the thoracic aorta. The bony thorax exhibits no acute abnormality. IMPRESSION: Left basilar atelectasis or pneumonia. Underlying chronic bronchitic changes. Followup PA and lateral chest X-ray is recommended in 3-4 weeks following trial of antibiotic therapy to ensure resolution and exclude underlying malignancy. Thoracic aortic atherosclerosis. Electronically Signed   By: David  Martinique M.D.   On: 05/14/2017 13:24   Dg Chest 2 View  Result Date: 05/11/2017 CLINICAL DATA:  Initial evaluation for acute shortness of breath and fever. EXAM: CHEST  2 VIEW  COMPARISON:  Prior radiograph from 02/01/2017. FINDINGS: Cardiomegaly, stable from previous. Mediastinal silhouette within normal limits. Aortic atherosclerosis. Lungs are hypoinflated. Patchy left lower lobe infiltrate, concerning for pneumonia given provided history. Streaky right basilar atelectasis. Perihilar vascular congestion without overt pulmonary edema. No pleural effusion. No pneumothorax. No acute osseus abnormality. Degenerative changes about the left shoulder. IMPRESSION: 1. Patchy left lower lobe infiltrate, concerning for pneumonia given provided history. 2. Stable cardiomegaly with mild perihilar vascular congestion without frank pulmonary edema. Electronically Signed   By: Jeannine Boga M.D.   On: 05/11/2017 19:43       Subjective: No new complaints , wants to go home,  No chest pain, sob, and cough has improved.  Discharge Exam: Vitals:   05/15/17 0758 05/15/17 0914  BP:  (!) 104/52  Pulse:  87  Resp:    Temp:  98.2 F (36.8 C)  SpO2: 97% 94%   Vitals:   05/14/17 2300 05/15/17 0633 05/15/17 0758 05/15/17 0914  BP:  117/66  (!) 104/52  Pulse: (!) 126 87  87  Resp:  20    Temp:  97.9 F (36.6 C)  98.2 F (36.8 C)  TempSrc:  Oral  Oral  SpO2:  97% 97% 94%  Weight:  61.8 kg (136 lb 4.8 oz)    Height:        General: Pt is alert, awake, not in acute distress Cardiovascular: RRR, S1/S2 +, no rubs, no gallops Respiratory: CTA bilaterally, no wheezing, no rhonchi Abdominal: Soft, NT, ND, bowel sounds + Extremities: no edema, no cyanosis    The results of significant diagnostics from this hospitalization (including imaging, microbiology, ancillary and laboratory) are listed below for reference.     Microbiology: Recent Results (from the past 240 hour(s))  Blood Culture (routine x 2)     Status: None (Preliminary result)   Collection Time: 05/11/17  6:15 PM  Result Value Ref Range Status   Specimen Description BLOOD LEFT FOREARM  Final   Special  Requests   Final    IN PEDIATRIC BOTTLE Blood Culture results may not be optimal due to an excessive volume of blood received in culture bottles   Culture   Final    NO GROWTH 4 DAYS Performed at [facility], [place] [address] [place], [place] [address]    Report  Status PENDING  Incomplete  Blood Culture (routine x 2)     Status: None (Preliminary result)   Collection Time: 05/11/17  6:39 PM  Result Value Ref Range Status   Specimen Description BLOOD RIGHT FOREARM  Final   Special Requests   Final    BOTTLES DRAWN AEROBIC AND ANAEROBIC Blood Culture adequate volume   Culture   Final    NO GROWTH 4 DAYS Performed at [facility], [address] [place], [place] [address]    Report Status PENDING  Incomplete  MRSA PCR Screening     Status: Abnormal   Collection Time: 05/11/17 10:57 PM  Result Value Ref Range Status   MRSA by PCR POSITIVE (Gina) NEGATIVE Final    Comment:        The GeneXpert MRSA Assay (FDA approved for NASAL specimens only), is one component of Gina comprehensive MRSA colonization surveillance program. It is not intended to diagnose MRSA infection nor to guide or monitor treatment for MRSA infections. RESULT CALLED TO, READ BACK BY AND VERIFIED WITH: MCINTOSH,R RN [phone] [id number] COVINGTON,N   Culture, sputum-assessment     Status: None   Collection Time: 05/12/17  7:40 AM  Result Value Ref Range Status   Specimen Description EXPECTORATED SPUTUM  Final   Special Requests NONE  Final   Sputum evaluation THIS SPECIMEN IS ACCEPTABLE FOR SPUTUM CULTURE  Final   Report Status 05/12/2017 FINAL  Final  Culture, respiratory (NON-Expectorated)     Status: None   Collection Time: 05/12/17  7:40 AM  Result Value Ref Range Status   Specimen Description EXPECTORATED SPUTUM  Final   Special Requests NONE Reflexed from [id number]  Final   Gram Stain   Final    ABUNDANT WBC PRESENT, PREDOMINANTLY PMN RARE SQUAMOUS EPITHELIAL CELLS PRESENT FEW GRAM POSITIVE RODS RARE  GRAM POSITIVE COCCI IN PAIRS Performed at [facility], [place] [address] [place], [place] [address]    Culture   Final    MODERATE METHICILLIN RESISTANT STAPHYLOCOCCUS AUREUS   Report Status 05/15/2017 FINAL  Final   Organism ID, Bacteria METHICILLIN RESISTANT STAPHYLOCOCCUS AUREUS  Final      Susceptibility   Methicillin resistant staphylococcus aureus - MIC*    CIPROFLOXACIN >=8 RESISTANT Resistant     ERYTHROMYCIN >=8 RESISTANT Resistant     GENTAMICIN <=0.5 SENSITIVE Sensitive     OXACILLIN >=4 RESISTANT Resistant     TETRACYCLINE <=1 SENSITIVE Sensitive     VANCOMYCIN 1 SENSITIVE Sensitive     TRIMETH/SULFA <=10 SENSITIVE Sensitive     CLINDAMYCIN <=0.25 SENSITIVE Sensitive     RIFAMPIN <=0.5 SENSITIVE Sensitive     Inducible Clindamycin NEGATIVE Sensitive     * MODERATE METHICILLIN RESISTANT STAPHYLOCOCCUS AUREUS     Labs: BNP (last 3 results) No results for input(s): BNP in the last 8760 hours. Basic Metabolic Panel: Recent Labs  Lab 05/10/17 05/10/17 1142 05/11/17 1815 05/12/17 0328 05/15/17 1100  NA 137 137 134* 141 139  K 3.7 3.9 3.9 3.5 3.1*  CL  --   --  105 113* 110  CO2  --  22 20* 22 25  GLUCOSE  --  102 100* 101* 121*  BUN 12 12.5 14 11 10   CREATININE 1.0 1.1 0.94 0.82 0.66  CALCIUM  --  10.1 9.3 8.7* 8.9   Liver Function Tests: Recent Labs  Lab 05/10/17 05/10/17 1142 05/11/17 1815  AST 21 33 42*  ALT 18 24 30   ALKPHOS 124 150  153*  BILITOT  --  0.46 0.8  PROT  --  6.6 5.9*  ALBUMIN  --  3.0* 3.1*   No results for input(s): LIPASE, AMYLASE in the last 168 hours. No results for input(s): AMMONIA in the last 168 hours. CBC: Recent Labs  Lab 05/10/17 05/10/17 1142 05/11/17 1815 05/12/17 0328 05/15/17 1100  WBC 2.5 3.1* 4.4 4.2 3.8*  NEUTROABS  --  2.4 3.6  --  2.9  HGB 8.0* 9.5* 8.5* 8.2* 8.4*  HCT 23* 28.0* 24.5* 24.2* 25.6*  MCV  --  110* 105.6* 106.6* 108.0*  PLT 305 327 327 296 326   Cardiac Enzymes: No results for input(s):  CKTOTAL, CKMB, CKMBINDEX, TROPONINI in the last 168 hours. BNP: Invalid input(s): POCBNP CBG: No results for input(s): GLUCAP in the last 168 hours. D-Dimer No results for input(s): DDIMER in the last 72 hours. Hgb A1c No results for input(s): HGBA1C in the last 72 hours. Lipid Profile No results for input(s): CHOL, HDL, LDLCALC, TRIG, CHOLHDL, LDLDIRECT in the last 72 hours. Thyroid function studies No results for input(s): TSH, T4TOTAL, T3FREE, THYROIDAB in the last 72 hours.  Invalid input(s): FREET3 Anemia work up No results for input(s): VITAMINB12, FOLATE, FERRITIN, TIBC, IRON, RETICCTPCT in the last 72 hours. Urinalysis    Component Value Date/Time   COLORURINE YELLOW 05/12/2017 0655   APPEARANCEUR HAZY (Gina) 05/12/2017 0655   LABSPEC 1.004 (L) 05/12/2017 0655   PHURINE 6.0 05/12/2017 0655   GLUCOSEU NEGATIVE 05/12/2017 0655   HGBUR SMALL (Gina) 05/12/2017 0655   BILIRUBINUR NEGATIVE 05/12/2017 0655   BILIRUBINUR neg 11/17/2014 1744   KETONESUR NEGATIVE 05/12/2017 0655   PROTEINUR NEGATIVE 05/12/2017 0655   UROBILINOGEN 0.2 11/17/2014 1744   UROBILINOGEN 0.2 07/20/2014 1340   NITRITE NEGATIVE 05/12/2017 0655   LEUKOCYTESUR LARGE (Gina) 05/12/2017 0655   Sepsis Labs Invalid input(s): PROCALCITONIN,  WBC,  LACTICIDVEN Microbiology Recent Results (from the past 240 hour(s))  Blood Culture (routine x 2)     Status: None (Preliminary result)   Collection Time: 05/11/17  6:15 PM  Result Value Ref Range Status   Specimen Description BLOOD LEFT FOREARM  Final   Special Requests   Final    IN PEDIATRIC BOTTLE Blood Culture results may not be optimal due to an excessive volume of blood received in culture bottles   Culture   Final    NO GROWTH 4 DAYS Performed at [facility], [place] [address], [place], [place] [address]    Report Status PENDING  Incomplete  Blood Culture (routine x 2)     Status: None (Preliminary result)   Collection Time: 05/11/17  6:39 PM  Result  Value Ref Range Status   Specimen Description BLOOD RIGHT FOREARM  Final   Special Requests   Final    BOTTLES DRAWN AEROBIC AND ANAEROBIC Blood Culture adequate volume   Culture   Final    NO GROWTH 4 DAYS Performed at [facility], [place] [address], [place], [place] [address]    Report Status PENDING  Incomplete  MRSA PCR Screening     Status: Abnormal   Collection Time: 05/11/17 10:57 PM  Result Value Ref Range Status   MRSA by PCR POSITIVE (Gina) NEGATIVE Final    Comment:        The GeneXpert MRSA Assay (FDA approved for NASAL specimens only), is one component of Gina comprehensive MRSA colonization surveillance program. It is not intended to diagnose MRSA infection nor to guide or  monitor treatment for MRSA infections. RESULT CALLED TO, READ BACK BY AND VERIFIED WITH: MCINTOSH,R RN [phone] [id number] COVINGTON,N   Culture, sputum-assessment     Status: None   Collection Time: 05/12/17  7:40 AM  Result Value Ref Range Status   Specimen Description EXPECTORATED SPUTUM  Final   Special Requests NONE  Final   Sputum evaluation THIS SPECIMEN IS ACCEPTABLE FOR SPUTUM CULTURE  Final   Report Status 05/12/2017 FINAL  Final  Culture, respiratory (NON-Expectorated)     Status: None   Collection Time: 05/12/17  7:40 AM  Result Value Ref Range Status   Specimen Description EXPECTORATED SPUTUM  Final   Special Requests NONE Reflexed from [id number]  Final   Gram Stain   Final    ABUNDANT WBC PRESENT, PREDOMINANTLY PMN RARE SQUAMOUS EPITHELIAL CELLS PRESENT FEW GRAM POSITIVE RODS RARE GRAM POSITIVE COCCI IN PAIRS Performed at [facility], [place] [address], [place],  [address]    Culture   Final    MODERATE METHICILLIN RESISTANT STAPHYLOCOCCUS AUREUS   Report Status 05/15/2017 FINAL  Final   Organism ID, Bacteria METHICILLIN RESISTANT STAPHYLOCOCCUS AUREUS  Final      Susceptibility   Methicillin resistant staphylococcus aureus - MIC*    CIPROFLOXACIN >=8 RESISTANT Resistant      ERYTHROMYCIN >=8 RESISTANT Resistant     GENTAMICIN <=0.5 SENSITIVE Sensitive     OXACILLIN >=4 RESISTANT Resistant     TETRACYCLINE <=1 SENSITIVE Sensitive     VANCOMYCIN 1 SENSITIVE Sensitive     TRIMETH/SULFA <=10 SENSITIVE Sensitive     CLINDAMYCIN <=0.25 SENSITIVE Sensitive     RIFAMPIN <=0.5 SENSITIVE Sensitive     Inducible Clindamycin NEGATIVE Sensitive     * MODERATE METHICILLIN RESISTANT STAPHYLOCOCCUS AUREUS     Time coordinating discharge: Over 30 minutes  SIGNED:   Hosie Poisson, MD  [facility] 05/15/2017, 1:57 PM Pager   If 7PM-7AM, please contact night-coverage [email or web address] Password [email or web address]

## 2017-05-15 NOTE — Care Management Note (Signed)
Case Management Note  Patient Details  Name: JENNI THEW MRN: [id number] Date of Birth: 1933/07/02  Subjective/Objective:  83 y/o f admitted w/Sepsis. From ALF. PT recc SNF-CSW already following for SNF.                  Action/Plan:d/c SNF.   Expected Discharge Date:                  Expected Discharge Plan:  Skilled Nursing Facility  In-House Referral:  Clinical Social Work  Discharge planning Services  CM Consult  Post Acute Care Choice:    Choice offered to:     DME Arranged:    DME Agency:     HH Arranged:    [place] Agency:     Status of Service:  Completed, signed off  If discussed at [employer], dates discussed:    Additional Comments:  Dessa Phi, RN 05/15/2017, 1:46 PM

## 2017-05-15 NOTE — Clinical Social Work Placement (Signed)
Patient received and accepted bed offer at [facility]. Facility aware of patient's discharge and confirmed bed offer. PTAR contacted, patient's family notified. Patient's RN can call report to [phone], packet complete. CSW signing off, no other needs identified at this time.   CLINICAL SOCIAL WORK PLACEMENT  NOTE  Date:  05/15/2017  Patient Details  Name: [name] MRN: [id number] Date of Birth: March 02, 1933  Clinical Social Work is seeking post-discharge placement for this patient at the [place] level of care (*CSW will initial, date and re-position this form in  chart as items are completed):  Yes   Patient/family provided with [place] Work Department's list of facilities offering this level of care within the geographic area requested by the patient (or if unable, by the patient's family).  Yes   Patient/family informed of their freedom to choose among providers that offer the needed level of care, that participate in Medicare, Medicaid or managed care program needed by the patient, have an available bed and are willing to accept the patient.  Yes   Patient/family informed of [facility]'s ownership interest in [facility] and [facility], as well as of the fact that they are under no obligation to receive care at these facilities.  PASRR submitted to EDS on       PASRR number received on       Existing PASRR number confirmed on 05/15/17     FL2 transmitted to all facilities in geographic area requested by pt/family on 05/15/17     FL2 transmitted to all facilities within larger geographic area on       Patient informed that his/her managed care company has contracts with or will negotiate with certain facilities, including the following:        Yes   Patient/family informed of bed offers received.  Patient chooses bed at [facility]     Physician recommends and patient chooses bed at      Patient  to be transferred to [facility] on 05/15/17.  Patient to be transferred to facility by PTAR     Patient family notified on 05/15/17 of transfer.  Name of family member notified:  Drexel Iha     PHYSICIAN       Additional Comment:    _______________________________________________ Burnis Medin, LCSW 05/15/2017, 2:27 PM

## 2017-05-15 NOTE — Progress Notes (Signed)
ANTICOAGULATION CONSULT NOTE - Follow Up  Pharmacy Consult for warfarin Indication: Hx of PE  Allergies  Allergen Reactions  . Adhesive [Tape] Rash    On on lower extremities    Patient Measurements: Height: 5\' 1"  (154.9 cm) Weight: 136 lb 4.8 oz (61.8 kg) IBW/kg (Calculated) : 47.8  Labs: Recent Labs    05/13/17 0520 05/14/17 0540 05/15/17 0443  LABPROT 28.4* 29.5* 32.9*  INR 2.70 2.83 3.25   Estimated Creatinine Clearance: 44.6 mL/min (by C-G formula based on SCr of 0.82 mg/dL).  Medications:   Scheduled:  . Chlorhexidine Gluconate Cloth  6 each Topical Q0600  . folic acid  1 mg Oral Daily  . gabapentin  100 mg Oral TID  . ipratropium  0.5 mg Nebulization TID  . latanoprost  1 drop Both Eyes QHS  . levalbuterol  0.63 mg Nebulization TID  . LORazepam  0.5 mg Oral QHS  . mometasone-formoterol  2 puff Inhalation BID  . mupirocin ointment  1 application Nasal BID  . oxybutynin  5 mg Oral BID  . pantoprazole  40 mg Oral Daily  . timolol  1 drop Left Eye Daily  . traZODone  50 mg Oral QHS  . warfarin  3 mg Oral Once per day on Sun Mon Wed Thu Fri Sat  . warfarin  6 mg Oral Once per day on Tue  . Warfarin - Pharmacist Dosing Inpatient   Does not apply q1800   Assessment: 58 yoF from SNF with SHOB, productive cough, fever,chills. CTX/Azith per Rx for CAP. Pharmacy consulted to continue warfarin dosing while inpatient.  PMH: Wegener's granulomatosis - Cyclophosphamide Hx PE: chronic Warfarin 3mg  daily except 6mg  Tu--LD 11/8. Admitting INR 2.92  Today, 05/15/2017  INR 3.25, increased to supra-therapeutic on home regimen  Hgb 8.2 (last CBC on 11/10), hx anemia  No bleeding or complications reported   Cardiac diet ordered; intake not recorded  Drug-drug interactions:  Antibiotics (ceftriaxone/azithromycin) may increase INR  Goal of Therapy:  INR 2-3   Plan:   HOLD warfarin today  Daily INR  If discharged, recommend holding warfarin on 11/13, then use  reduced warfarin dose of 2mg  daily.  Recheck INR on 11/15 or 11/6.  Gretta Arab PharmD, BCPS Pager [phone] 05/15/2017 8:14 AM

## 2017-05-15 NOTE — Care Management Important Message (Addendum)
Important Message  Patient Details IM Letter given to Kathy/Case  Manager to present to Patient Name: GABBRIELLA PRESSWOOD MRN: [id number] Date of Birth: 22-May-1933   Medicare Important Message Given:  Yes    Kerin Salen 05/15/2017, 1:45 PMImportant Message  Patient Details  Name: KIPPY MELENA MRN: [id number] Date of Birth: [birth year]   Medicare Important Message Given:  Yes    Kerin Salen 05/15/2017, 1:45 PM

## 2017-05-15 NOTE — Clinical Social Work Note (Signed)
Clinical Social Work Assessment  Patient Details  Name: [name] MRN: [id number] Date of Birth: 1932-10-23  Date of referral:  05/15/17               Reason for consult:  Facility Placement, Discharge Planning                Permission sought to share information with:  Facility Art therapist granted to share information::  Yes, Verbal Permission Granted  Name::        Agency::     Relationship::     Contact Information:     Housing/Transportation Living arrangements for the past 2 months:  [place] of Information:  Patient, Adult Children Patient Interpreter Needed:  None Criminal Activity/Legal Involvement Pertinent to Current Situation/Hospitalization:  No - Comment as needed Significant Relationships:  Adult Children, Friend Lives with:  Facility Resident, Self Do you feel safe going back to the place where you live?  Yes(PT recommending SNF) Need for family participation in patient care:  Yes (Comment)  Care giving concerns:  Patient from [facility]. PT recommending SNF for ST rehab.   Social Worker assessment / plan:  CSW spoke with patient and patient's daughter regarding PT recommendation for SNF for ST rehab. Patient reported that she prefers to return home where her friends and books are, CSW explained to patient that they are recommending ST rehab prior to patient returning to her ALF room. Patient's daughter spoke with patient about SNF, patient agreeable. CSW contacted [facility], staff reported that they have a SNF bed available for patient. Patient's daughter updated, patient's daughter reported that patient will need PTAR for transport.   CSW completed FL2 and will continue to follow and assist with discharge planning.  Employment status:  Retired Nurse, adult PT Recommendations:  [name] / Referral to community resources:  [place]  Patient/Family's Response to care:  Patient/patient's daughter agreeable to SNF for ST rehab. Patient's daughter appreciative of CSW assistance with discharge planning.  Patient/Family's Understanding of and Emotional Response to Diagnosis, Current Treatment, and Prognosis:  Patient presented alert and oriented x 2. Patient's daughter reported that patient has fluctuating orientation. Patient's daughter verbalized understanding of patient's care and verbalized plan for patient to discharge to SNF for ST rehab prior to returning to ALF. Patient's daughter involved in patient's care and patient reported that her daughter recently moved to [facility] from [place], noting she was happy that here daughter is here now. Patient reported that she has a son in [place] that she gets to speak with on the phone. Patient continued to speak with CSW about her family and reported that her husband passed in March 2018, CSW provided emotional support and positively affirmed patient's present support system.   Emotional Assessment Appearance:  Appears stated age Attitude/Demeanor/Rapport:  Other(Open) Affect (typically observed):  Appropriate Orientation:  Oriented to Self, Oriented to Situation Alcohol / Substance use:  Not Applicable Psych involvement (Current and /or in the community):  No (Comment)  Discharge Needs  Concerns to be addressed:  Care Coordination Readmission within the last 30 days:  No Current discharge risk:  Physical Impairment Barriers to Discharge:  No Barriers Identified   Burnis Medin, LCSW 05/15/2017, 12:05 PM

## 2017-05-15 NOTE — Progress Notes (Signed)
Report called to nurse Randall Hiss at at [facility]. All questions were answered and nurse was told to call back for any further questions.

## 2017-05-16 ENCOUNTER — Telehealth: Payer: Self-pay

## 2017-05-16 LAB — CULTURE, BLOOD (ROUTINE X 2)
CULTURE: NO GROWTH
Culture: NO GROWTH
Special Requests: ADEQUATE

## 2017-05-16 NOTE — Telephone Encounter (Signed)
Possible re-admission to facility. This is a patient you were seeing at [facility] . [facility] F/U is needed if patient was re-admitted to facility upon discharge. Hospital discharge from [facility] on 05/15/17.

## 2017-05-17 ENCOUNTER — Non-Acute Institutional Stay (SKILLED_NURSING_FACILITY): Payer: Medicare Other | Admitting: Internal Medicine

## 2017-05-17 ENCOUNTER — Encounter: Payer: Self-pay | Admitting: Internal Medicine

## 2017-05-17 DIAGNOSIS — G600 Hereditary motor and sensory neuropathy: Secondary | ICD-10-CM | POA: Diagnosis not present

## 2017-05-17 DIAGNOSIS — M3130 Wegener's granulomatosis without renal involvement: Secondary | ICD-10-CM

## 2017-05-17 DIAGNOSIS — R531 Weakness: Secondary | ICD-10-CM

## 2017-05-17 DIAGNOSIS — K219 Gastro-esophageal reflux disease without esophagitis: Secondary | ICD-10-CM | POA: Diagnosis not present

## 2017-05-17 DIAGNOSIS — I82501 Chronic embolism and thrombosis of unspecified deep veins of right lower extremity: Secondary | ICD-10-CM

## 2017-05-17 DIAGNOSIS — J189 Pneumonia, unspecified organism: Secondary | ICD-10-CM

## 2017-05-17 DIAGNOSIS — R791 Abnormal coagulation profile: Secondary | ICD-10-CM | POA: Diagnosis not present

## 2017-05-17 LAB — PROTIME-INR: Protime: 49 — AB (ref 10.0–13.8)

## 2017-05-17 LAB — POCT INR: INR: 4.7 — AB (ref <=1.1)

## 2017-05-17 NOTE — Progress Notes (Signed)
Provider:  Blanchie Serve MD  Location:  [place] [place] Room Number: 17 Place of Service:  SNF (31)  PCP: Blanchie Serve, MD [name], MD as PCP - General (Internal Medicine) Unice Bailey, MD as Consulting Physician (Rheumatology) Rozetta Nunnery, MD as Consulting Physician (Otolaryngology) Marcial Pacas, MD as Consulting Physician (Neurology) Michel Bickers, MD as Consulting Physician (Infectious Diseases) Marin Olp Rudell Cobb, MD as Consulting Physician (Oncology) Juanito Doom, MD as Consulting Physician (Pulmonary Disease) Tanda Rockers, MD as Consulting Physician (Pulmonary Disease) Estill Dooms, MD as Consulting Physician (Geriatric Medicine) Coralie Keens, MD as Consulting Physician (General Surgery) Mast, Man X, NP as Nurse Practitioner (Nurse Practitioner)  Extended Emergency Contact Information Primary Emergency Contact: [facility] Address: [place], [place] [address] Johnnette Litter of [place] Phone: [phone] Mobile Phone: [phone] Relation: Daughter Secondary Emergency Contact: Lizana,[name] Address: [address] [place]          [place], [place] [address] [place] Phone: [phone] Mobile Phone: [phone] Relation: Son  Code Status: full code  Goals of Care: Advanced Directive information Advanced Directives 05/17/2017  Does Patient Have a Medical Advance Directive? Yes  Type of Paramedic of [place];Living will  Does patient want to make changes to medical advance directive? No - Patient declined  Copy of [place] in Chart? Yes  Would patient like information on creating a medical advance directive? -    Chief Complaint  Patient presents with  . New Admit To SNF    New Admission Visit     HPI: Patient is a 83 y.o. female seen today for admission visit. She was in the hospital from  05/11/17-05/15/17 with left lobar pneumonia and sepsis. Influenza was ruled out. Sputum culture was positive for MRSA. She was placed on oxygen and started on iv antibiotic. She was later switched to oral antibiotic. Her cytoxan was held due to infection. She has history of wegner's granulomatosis, anemia of chronic disease and pulmonary embolism among others. With her leg swelling, venous doppler was performed showing chronic RLE DVT in mid femoral vein. She is seen in her room today. She would like some medication to aid with her sleeping at night. She has been working with therapy team.   Past Medical History:  Diagnosis Date  . Acute respiratory failure with hypoxia ([place])   . Anemia   . Aspergillosis ([place])   . Bladder incontinence   . Blindness of one eye   . Cancer ([place])    melanoma   ([place])  . Chronic sinusitis   . Clotting disorder ([place])   . Difficult intubation    Difficult airway with intubation for ARF 07/22/14 (due to anterior larynx, also had mucous plug)  . Fatigue 12/09/2015  . Glaucoma   . Hearing loss in left ear    hearing aid both ears  . History of pulmonary embolism 1997  . Interstitial emphysema ([place])   . Left shoulder pain 12/09/2015  . Multiple allergies   . Neuropathy   . OA (osteoarthritis)   . Pneumonia 12/15  . Shortness of breath dyspnea   . Stroke [facility])    ??? blind left eye  . Wegener's granulomatosis ([place])    Past Surgical History:  Procedure Laterality Date  . bronchosocpy     pt. states she has had over 10 in last 58  yrs.  Marland Kitchen CATARACT EXTRACTION    . COCHLEAR IMPLANT Left 10/27/2015   Procedure: LEFT COCHLEAR IMPLANT;  Surgeon: Leta Baptist, MD;  Location: [facility];  Service: ENT;  Laterality: Left;  . INGUINAL HERNIA REPAIR Left 02/17/2014   Procedure: LEFT INGUINAL HERNIA REPAIR WITH MESH;  Surgeon: Harl Bowie, MD;  Location: [facility];  Service: General;  Laterality: Left;  . INSERTION OF MESH N/A 02/17/2014   Procedure: INSERTION  OF MESH;  Surgeon: Harl Bowie, MD;  Location: [facility];  Service: General;  Laterality: N/A;  . KNEE ARTHROSCOPY  2006   rt   . MELANOMA EXCISION     left leg  . PUBOVAGINAL SLING    . TEAR DUCT PROBING    . TUBAL LIGATION    . TYMPANOMASTOIDECTOMY Left 04/28/2015  . TYMPANOMASTOIDECTOMY Left 04/28/2015   Procedure: LEFT TYMPANOMASTOIDECTOMY;  Surgeon: Leta Baptist, MD;  Location: [facility];  Service: ENT;  Laterality: Left;  Marland Kitchen VENA CAVA FILTER PLACEMENT  1997   [place] filter    reports that she quit smoking about 32 years ago. Her smoking use included cigarettes. She has a 45.00 pack-year smoking history. she has never used smokeless tobacco. She reports that she does not drink alcohol or use drugs. Social History   Socioeconomic History  . Marital status: Married    Spouse name: Not on file  . Number of children: Not on file  . Years of education: Not on file  . Highest education level: Not on file  Social Needs  . Financial resource strain: Not on file  . Food insecurity - worry: Not on file  . Food insecurity - inability: Not on file  . Transportation needs - medical: Not on file  . Transportation needs - non-medical: Not on file  Occupational History  . Occupation: retired Games developer: RETIRED  Tobacco Use  . Smoking status: Former Smoker    Packs/day: 1.50    Years: 30.00    Pack years: 45.00    Types: Cigarettes    Last attempt to quit: 07/03/1984    Years since quitting: 32.8  . Smokeless tobacco: Never Used  . Tobacco comment: quit smoking 30 years ago  Substance and Sexual Activity  . Alcohol use: No    Alcohol/week: 0.0 oz  . Drug use: No  . Sexual activity: No  Other Topics Concern  . Not on file  Social History Narrative   [facility] at [facility] moved to [place] 05/25/15   [place] dementia, died 11-26-2016   Former smoker stopped 1986   Alcohol occasional wine   Exercise PT 3 x a week   Power wheelchair   Has  POA, Living Will    Functional Status Survey:    Family History  Problem Relation Age of Onset  . Heart disease Father   . CVA Father   . Congestive Heart Failure Mother   . Heart disease Mother   . Lymphoma Sister   . Dementia Sister   . Alzheimer's disease Sister   . Uterine cancer Sister   . Skin cancer Sister   . Osteoarthritis Sister     Health Maintenance  Topic Date Due  . TETANUS/TDAP  07/03/2017 (Originally 09/29/1951)  . INFLUENZA VACCINE  Completed  . DEXA SCAN  Completed  . PNA vac Low Risk Adult  Completed    Allergies  Allergen Reactions  . Adhesive [Tape] Rash    On on  lower extremities     Outpatient Encounter Medications as of 05/17/2017  Medication Sig  . acetaminophen (TYLENOL) 650 MG CR tablet Take 650 mg by mouth at bedtime. Can also take 650 mg by mouth 2 times daily as needed  . azithromycin (ZITHROMAX) 500 MG tablet Take 1 tablet (500 mg total) daily for 3 days by mouth. Take 1 tablet daily for 3 days.  . calcium-vitamin D (OSCAL WITH D) 500-200 MG-UNIT per tablet Take 1 tablet by mouth daily.   . cholecalciferol (VITAMIN D) 1000 UNITS tablet One daily for vitamin D supplement  . diclofenac sodium (VOLTAREN) 1 % GEL Apply 2 g topically 3 (three) times daily. Apply to left shoulder.  . doxycycline (VIBRA-TABS) 100 MG tablet Take 1 tablet (100 mg total) every 12 (twelve) hours by mouth.  . Fluticasone-Salmeterol (ADVAIR DISKUS) 500-50 MCG/DOSE AEPB INHALE 1 PUFF EVERY 12 HOURS.  . folic acid (FOLVITE) 1 MG tablet Take 1 mg by mouth daily.   Marland Kitchen gabapentin (NEURONTIN) 100 MG capsule Take 100 mg by mouth 3 (three) times daily.  Marland Kitchen guaiFENesin (MUCINEX) 600 MG 12 hr tablet Take 600 mg by mouth 2 (two) times daily.  Marland Kitchen guaiFENesin-dextromethorphan (ROBITUSSIN DM) 100-10 MG/5ML syrup Take 5 mLs every 4 (four) hours as needed by mouth for cough.  . hydrocortisone cream 1 % Apply 1 application topically 4 (four) times daily as needed for itching (in addition  to the scheduled does).  Marland Kitchen ipratropium (ATROVENT) 0.02 % nebulizer solution Take 2.5 mLs (0.5 mg total) 3 (three) times daily by nebulization.  Marland Kitchen latanoprost (XALATAN) 0.005 % ophthalmic solution Place 1 drop into both eyes at bedtime.   . levalbuterol (XOPENEX) 0.63 MG/3ML nebulizer solution Take 0.63 mg by nebulization every 6 (six) hours as needed for wheezing or shortness of breath.  Marland Kitchen LORazepam (ATIVAN) 0.5 MG tablet Take 1 tablet (0.5 mg total) by mouth at bedtime.  . naphazoline-pheniramine (NAPHCON-A) 0.025-0.3 % ophthalmic solution Place 1 drop 3 (three) times daily into the left eye.   Marland Kitchen omeprazole (PRILOSEC) 20 MG capsule Take 20 mg by mouth daily.  Marland Kitchen oxybutynin (DITROPAN) 5 MG tablet One twice daily to help bladder control  . phenol (SORE THROAT SPRAY) 1.4 % LIQD Use as directed 1 spray every 8 (eight) hours as needed in the mouth or throat for throat irritation / pain.   Marland Kitchen saccharomyces boulardii (FLORASTOR) 250 MG capsule Take 250 mg by mouth daily.   . cyclophosphamide (CYTOXAN) 25 MG tablet Take 25 mg by mouth daily. Give on an empty stomach 1 hour before or 2 hours after meals. Take along with the 50 mg tablet to make a total of 75 mg  . cyclophosphamide (CYTOXAN) 50 MG tablet Take 50 mg by mouth daily. Give on an empty stomach 1 hour before or 2 hours after meals. Take along with 25 mg tablet to make 75 mg  . Multiple Vitamins-Minerals (THERAVIM-M) TABS Take 1 tablet by mouth daily.  . [DISCONTINUED] mupirocin ointment (BACTROBAN) 2 % Place 1 application 2 (two) times daily into the nose.  . [DISCONTINUED] sulfamethoxazole-trimethoprim (BACTRIM DS,SEPTRA DS) 800-160 MG per tablet Take 1 tablet by mouth every Monday, Wednesday, and Friday. HOLD WHILE ON AUGMENTIN, THEN RESUME AS PREVIOUS  . [DISCONTINUED] timolol (BETIMOL) 0.5 % ophthalmic solution Place 1 drop into the left eye daily.  . [DISCONTINUED] UNABLE TO FIND Med Name: Duke's magic mouthwash. Swish and spit 5 mL QID PRN  .  [DISCONTINUED] warfarin (COUMADIN) 3 MG tablet Take  3 mg by mouth daily.  . [DISCONTINUED] warfarin (COUMADIN) 6 MG tablet Take 1 tablet (6 mg total) once a week by mouth.   No facility-administered encounter medications on file as of 05/17/2017.     Review of Systems  Constitutional: Positive for fatigue. Negative for appetite change, chills, diaphoresis and fever.  HENT: Positive for congestion, hearing loss, rhinorrhea and sore throat. Negative for ear pain, mouth sores, sinus pressure and trouble swallowing.        Hearing aide to right ear  Eyes: Positive for visual disturbance.       Legally blind in left eye  Respiratory: Positive for cough and shortness of breath. Negative for wheezing.   Cardiovascular: Positive for leg swelling. Negative for chest pain and palpitations.  Gastrointestinal: Negative for abdominal pain, constipation, diarrhea, nausea and vomiting.  Genitourinary: Negative for dysuria and hematuria.  Musculoskeletal: Positive for gait problem. Negative for back pain.  Skin: Negative for rash and wound.  Neurological: Negative for dizziness, seizures, numbness and headaches.  Hematological: Bruises/bleeds easily.  Psychiatric/Behavioral: Negative for behavioral problems and confusion.    Vitals:   05/17/17 1219  BP: 118/70  Pulse: 72  Resp: 18  Temp: 97.8 F (36.6 C)  TempSrc: Oral  SpO2: 94%  Weight: 133 lb 11.2 oz (60.6 kg)  Height: 5\' 1"  (1.549 m)   Body mass index is 25.26 kg/m.   Wt Readings from Last 3 Encounters:  05/17/17 133 lb 11.2 oz (60.6 kg)  05/15/17 136 lb 4.8 oz (61.8 kg)  05/10/17 136 lb (61.7 kg)   Physical Exam  Constitutional: She is oriented to person, place, and time. She appears well-developed and well-nourished. No distress.  HENT:  Head: Normocephalic and atraumatic.  Mouth/Throat: Oropharynx is clear and moist. No oropharyngeal exudate.  Eyes: Conjunctivae are normal. Right eye exhibits no discharge. Left eye exhibits no  discharge.  Neck: Neck supple.  Cardiovascular: Normal rate and regular rhythm.  Pulmonary/Chest: Effort normal.  Decreased air entry, rhonchi +  Abdominal: Soft. Bowel sounds are normal. There is no tenderness. There is no guarding.  Musculoskeletal: She exhibits edema.  Right leg > left leg edema, can move all 4 extremities, generalized weakness  Lymphadenopathy:    She has no cervical adenopathy.  Neurological: She is alert and oriented to person, place, and time.  Skin: Skin is warm and dry. She is not diaphoretic.  Psychiatric: She has a normal mood and affect. Her behavior is normal.    Labs reviewed: Basic Metabolic Panel: Recent Labs    05/11/17 1815 05/12/17 0328 05/15/17 1100  NA 134* 141 139  K 3.9 3.5 3.1*  CL 105 113* 110  CO2 20* 22 25  GLUCOSE 100* 101* 121*  BUN 14 11 10   CREATININE 0.94 0.82 0.66  CALCIUM 9.3 8.7* 8.9   Liver Function Tests: Recent Labs    03/29/17 1340  05/10/17 05/10/17 1142 05/11/17 1815  AST 39*   < > 21 33 42*  ALT 28   < > 18 24 30   ALKPHOS 120*   < > 124 150 153*  BILITOT 0.50  --   --  0.46 0.8  PROT 6.7  --   --  6.6 5.9*  ALBUMIN 3.1*  --   --  3.0* 3.1*   < > = values in this interval not displayed.   No results for input(s): LIPASE, AMYLASE in the last 8760 hours. No results for input(s): AMMONIA in the last 8760 hours. CBC: Recent Labs  05/10/17 1142 05/11/17 1815 05/12/17 0328 05/15/17 1100  WBC 3.1* 4.4 4.2 3.8*  NEUTROABS 2.4 3.6  --  2.9  HGB 9.5* 8.5* 8.2* 8.4*  HCT 28.0* 24.5* 24.2* 25.6*  MCV 110* 105.6* 106.6* 108.0*  PLT 327 327 296 326   Cardiac Enzymes: No results for input(s): CKTOTAL, CKMB, CKMBINDEX, TROPONINI in the last 8760 hours. BNP: Invalid input(s): POCBNP Lab Results  Component Value Date   HGBA1C 5.8 (H) 07/28/2014   Lab Results  Component Value Date   TSH 1.56 05/13/2015   Lab Results  Component Value Date   SAYTKZSW10 932 07/28/2014   Lab Results  Component Value  Date   FOLATE 12.4 07/28/2014   Lab Results  Component Value Date   IRON 48 05/10/2017   TIBC 165 (L) 05/10/2017   FERRITIN 704 (H) 05/10/2017    Imaging and Procedures obtained prior to SNF admission: Dg Chest 2 View  Result Date: 05/11/2017 CLINICAL DATA:  Initial evaluation for acute shortness of breath and fever. EXAM: CHEST  2 VIEW COMPARISON:  Prior radiograph from 02/01/2017. FINDINGS: Cardiomegaly, stable from previous. Mediastinal silhouette within normal limits. Aortic atherosclerosis. Lungs are hypoinflated. Patchy left lower lobe infiltrate, concerning for pneumonia given provided history. Streaky right basilar atelectasis. Perihilar vascular congestion without overt pulmonary edema. No pleural effusion. No pneumothorax. No acute osseus abnormality. Degenerative changes about the left shoulder. IMPRESSION: 1. Patchy left lower lobe infiltrate, concerning for pneumonia given provided history. 2. Stable cardiomegaly with mild perihilar vascular congestion without frank pulmonary edema. Electronically Signed   By: Jeannine Boga M.D.   On: 05/11/2017 19:43    Assessment/Plan  Generalized weakness From deconditioning. Will have her work with physical therapy and occupational therapy team to help with gait training and muscle strengthening exercises.fall precautions. Skin care. Encourage to be out of bed.   HCAP Left lower lobe infiltrate. Afebrile at present. Continue azithromycin until 05/18/17 and doxycycline until 05/25/17. Monitor wbc and temp curve. Hold bactrim for now until completion of this antibiotic course. Continue mucinex bid for cough and sore throat spray as needed. Encourage to use incentive spirometer. Continue ipratropium tid for bronchodilation. Continue florastor. To use incentive spirometer.  Wegner's granulomatosis Cytoxan on hold. Will set f/u with rheumatology. Monitor wbc curve. Resume bactrim after completion of current antibiotic course.    supratherapeutic INR INR 4.5 yesterday. re- check INR today 4.7. No signs of bleeding. Hold coumadin. Check inr 05/18/17  gerd Continue omeprazole 20 mg daily.   Chronic leg DVT On chronic anticoagulation with coumadin, currently on hold  Peripheral neuropathy Continue gabapentin 100 mg tid.   Insomnia Likely situational, start trazodone 25 mg qhs prn and monitor  Family/ staff Communication: reviewed care plan with patient and charge nurse.    Labs/tests ordered: cbc with diff, bmp 11/20 and 11/27  Blanchie Serve, MD Internal Medicine [facility] [address] [place], [place] [address] Cell Phone (Monday-Friday 8 am - 5 pm): [phone] On Call: [phone] and follow prompts after 5 pm and on weekends Office Phone: [phone] Office Fax: [phone]

## 2017-05-18 ENCOUNTER — Other Ambulatory Visit: Payer: Self-pay | Admitting: *Deleted
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
## 2017-05-21 ENCOUNTER — Other Ambulatory Visit: Payer: Self-pay | Admitting: Family

## 2017-05-21 ENCOUNTER — Encounter: Payer: Self-pay | Admitting: Family

## 2017-05-21 DIAGNOSIS — D649 Anemia, unspecified: Secondary | ICD-10-CM

## 2017-05-21 DIAGNOSIS — D638 Anemia in other chronic diseases classified elsewhere: Secondary | ICD-10-CM

## 2017-05-21 DIAGNOSIS — M3130 Wegener's granulomatosis without renal involvement: Secondary | ICD-10-CM

## 2017-05-28 LAB — POCT INR: INR: 4.4 — AB (ref <=1.1)

## 2017-05-29 ENCOUNTER — Other Ambulatory Visit: Payer: Self-pay | Admitting: *Deleted

## 2017-05-29 ENCOUNTER — Non-Acute Institutional Stay (SKILLED_NURSING_FACILITY): Payer: Medicare Other | Admitting: Nurse Practitioner

## 2017-05-29 ENCOUNTER — Encounter: Payer: Self-pay | Admitting: Nurse Practitioner

## 2017-05-29 DIAGNOSIS — D638 Anemia in other chronic diseases classified elsewhere: Secondary | ICD-10-CM | POA: Diagnosis not present

## 2017-05-29 DIAGNOSIS — M3130 Wegener's granulomatosis without renal involvement: Secondary | ICD-10-CM

## 2017-05-29 DIAGNOSIS — Z7901 Long term (current) use of anticoagulants: Secondary | ICD-10-CM

## 2017-05-29 DIAGNOSIS — E876 Hypokalemia: Secondary | ICD-10-CM

## 2017-05-29 DIAGNOSIS — E440 Moderate protein-calorie malnutrition: Secondary | ICD-10-CM | POA: Diagnosis not present

## 2017-05-29 NOTE — Assessment & Plan Note (Signed)
05/29/17 showed total protein 4.7, albumin 2.5, dietary consultation.

## 2017-05-29 NOTE — Assessment & Plan Note (Addendum)
Cytoxan was discontinued per Rheumatology 05/21/17. Septra DS 3x/week resumed 05/28/17

## 2017-05-29 NOTE — Progress Notes (Signed)
Location:   [facility] Room Number: 17 Place of Service:  SNF (31) Provider: Lennie Odor Shawndrea Rutkowski NP  Blanchie Serve, MD  [name], MD as PCP - General (Internal Medicine) Unice Bailey, MD as Consulting Physician (Rheumatology) Rozetta Nunnery, MD as Consulting Physician (Otolaryngology) Marcial Pacas, MD as Consulting Physician (Neurology) Michel Bickers, MD as Consulting Physician (Infectious Diseases) Marin Olp Rudell Cobb, MD as Consulting Physician (Oncology) Juanito Doom, MD as Consulting Physician (Pulmonary Disease) Tanda Rockers, MD as Consulting Physician (Pulmonary Disease) Estill Dooms, MD as Consulting Physician (Geriatric Medicine) Coralie Keens, MD as Consulting Physician (General Surgery) Farren Nelles X, NP as Nurse Practitioner (Nurse Practitioner)  Extended Emergency Contact Information Primary Emergency Contact: [facility] Address: [place], [place] [address] [place] Phone: [phone] Mobile Phone: [phone] Relation: Daughter Secondary Emergency Contact: Strehl,[name] Address: [address] [place]          [place], [place] [address] [place] Phone: [phone] Mobile Phone: [phone] Relation: Son  Code Status: DNR Goals of care: Advanced Directive information Advanced Directives 05/17/2017  Does Patient Have a Medical Advance Directive? Yes  Type of Paramedic of [place];Living will  Does patient want to make changes to medical advance directive? No - Patient declined  Copy of [place] in Chart? Yes  Would patient like information on creating a medical advance directive? -     Chief Complaint  Patient presents with  . Acute Visit    low sodium    HPI:  Pt is a 83 y.o. female seen today for an acute visit for dropping of serum Potassium, 05/29/17 K 3.2. She denied constipation, increased  fatigue, muscle spasm, or increased SOB. CMP 05/29/17 showed total protein 4.7, albumin 2.5. Chronic anemia, periodic Fe infusion at Hematology, Hgb 9.8 05/29/17. Cytoxan was discontinued per Rheumatology 05/21/17.    Past Medical History:  Diagnosis Date  . Acute respiratory failure with hypoxia ([place])   . Anemia   . Aspergillosis ([place])   . Bladder incontinence   . Blindness of one eye   . Cancer ([place])    melanoma   ([place])  . Chronic sinusitis   . Clotting disorder ([place])   . Difficult intubation    Difficult airway with intubation for ARF 07/22/14 (due to anterior larynx, also had mucous plug)  . Fatigue 12/09/2015  . Glaucoma   . Hearing loss in left ear    hearing aid both ears  . History of pulmonary embolism 1997  . Interstitial emphysema ([place])   . Left shoulder pain 12/09/2015  . Multiple allergies   . Neuropathy   . OA (osteoarthritis)   . Pneumonia 12/15  . Shortness of breath dyspnea   . Stroke [facility])    ??? blind left eye  . Wegener's granulomatosis ([place])    Past Surgical History:  Procedure Laterality Date  . bronchosocpy     pt. states she has had over 10 in last 20 yrs.  Marland Kitchen CATARACT EXTRACTION    . COCHLEAR IMPLANT Left 10/27/2015   Procedure: LEFT COCHLEAR IMPLANT;  Surgeon: Leta Baptist, MD;  Location: [facility];  Service: ENT;  Laterality: Left;  . INGUINAL HERNIA REPAIR Left 02/17/2014   Procedure: LEFT INGUINAL HERNIA REPAIR WITH MESH;  Surgeon: Harl Bowie, MD;  Location: [facility];  Service: General;  Laterality: Left;  .  INSERTION OF MESH N/A 02/17/2014   Procedure: INSERTION OF MESH;  Surgeon: Harl Bowie, MD;  Location: [facility];  Service: General;  Laterality: N/A;  . KNEE ARTHROSCOPY  2006   rt   . MELANOMA EXCISION     left leg  . PUBOVAGINAL SLING    . TEAR DUCT PROBING    . TUBAL LIGATION    . TYMPANOMASTOIDECTOMY Left 04/28/2015  . TYMPANOMASTOIDECTOMY Left 04/28/2015   Procedure: LEFT TYMPANOMASTOIDECTOMY;   Surgeon: Leta Baptist, MD;  Location: [facility];  Service: ENT;  Laterality: Left;  Marland Kitchen VENA CAVA FILTER PLACEMENT  1997   [place] filter    Allergies  Allergen Reactions  . Adhesive [Tape] Rash    On on lower extremities     Allergies as of 05/29/2017      Reactions   Adhesive [tape] Rash   On on lower extremities       Medication List        Accurate as of 05/29/17 11:59 PM. Always use your most recent med list.          acetaminophen 650 MG CR tablet Commonly known as:  TYLENOL Take 650 mg by mouth at bedtime. Can also take 650 mg by mouth 2 times daily as needed   calcium-vitamin D 500-200 MG-UNIT tablet Commonly known as:  OSCAL WITH D Take 1 tablet by mouth daily.   cholecalciferol 1000 units tablet Commonly known as:  VITAMIN D One daily for vitamin D supplement   diclofenac sodium 1 % Gel Commonly known as:  VOLTAREN Apply 2 g topically 3 (three) times daily. Apply to left shoulder.   Fluticasone-Salmeterol 500-50 MCG/DOSE Aepb Commonly known as:  ADVAIR DISKUS INHALE 1 PUFF EVERY 12 HOURS.   folic acid 1 MG tablet Commonly known as:  FOLVITE Take 1 mg by mouth daily.   gabapentin 100 MG capsule Commonly known as:  NEURONTIN Take 100 mg by mouth 3 (three) times daily.   guaiFENesin 600 MG 12 hr tablet Commonly known as:  MUCINEX Take 600 mg by mouth 2 (two) times daily.   guaiFENesin-dextromethorphan 100-10 MG/5ML syrup Commonly known as:  ROBITUSSIN DM Take 5 mLs every 4 (four) hours as needed by mouth for cough.   hydrocortisone cream 1 % Apply 1 application topically 4 (four) times daily as needed for itching (in addition to the scheduled does).   ipratropium 0.02 % nebulizer solution Commonly known as:  ATROVENT Take 2.5 mLs (0.5 mg total) 3 (three) times daily by nebulization.   latanoprost 0.005 % ophthalmic solution Commonly known as:  XALATAN Place 1 drop into both eyes at bedtime.   levalbuterol 0.63 MG/3ML nebulizer solution Commonly  known as:  XOPENEX Take 0.63 mg by nebulization every 6 (six) hours as needed for wheezing or shortness of breath.   LORazepam 0.5 MG tablet Commonly known as:  ATIVAN Take 1 tablet (0.5 mg total) by mouth at bedtime.   NAPHCON-A 0.025-0.3 % ophthalmic solution Generic drug:  naphazoline-pheniramine Place 1 drop 3 (three) times daily into the left eye.   omeprazole 20 MG capsule Commonly known as:  PRILOSEC Take 20 mg by mouth daily.   oxybutynin 5 MG tablet Commonly known as:  DITROPAN One twice daily to help bladder control   saccharomyces boulardii 250 MG capsule Commonly known as:  FLORASTOR Take 250 mg by mouth daily.   SORE THROAT SPRAY 1.4 % Liqd Generic drug:  phenol Use as directed 1 spray every 8 (eight)  hours as needed in the mouth or throat for throat irritation / pain.   sulfamethoxazole-trimethoprim 800-160 MG tablet Commonly known as:  BACTRIM DS,SEPTRA DS Take 1 tablet by mouth 3 (three) times a week. Mon, Wed, Fri   THERAVIM-M Tabs Take 1 tablet by mouth daily.   traZODone 50 MG tablet Commonly known as:  DESYREL Take 50 mg by mouth at bedtime.       Review of Systems  Constitutional: Positive for fatigue. Negative for activity change, appetite change, chills, diaphoresis and fever.  HENT: Positive for hearing loss. Negative for congestion, trouble swallowing and voice change.   Respiratory: Positive for cough. Negative for choking, chest tightness, shortness of breath and wheezing.   Cardiovascular: Negative for chest pain, palpitations and leg swelling.  Gastrointestinal: Negative for abdominal distention, abdominal pain, constipation, diarrhea, nausea and vomiting.  Genitourinary: Negative for difficulty urinating, dysuria, frequency and urgency.  Musculoskeletal: Positive for arthralgias, back pain and gait problem. Negative for joint swelling and myalgias.  Skin: Negative for color change, pallor, rash and wound.  Neurological: Negative for  tremors, speech difficulty, weakness and headaches.       Tingling pain in toes.   Hematological: Bruises/bleeds easily.  Psychiatric/Behavioral: Positive for sleep disturbance. Negative for agitation, behavioral problems, confusion and hallucinations. The patient is not nervous/anxious.     Immunization History  Administered Date(s) Administered  . Influenza Split 03/04/2012, 03/11/2014, 04/01/2015  . Influenza,inj,Quad PF,6+ Mos 03/03/2013  . Influenza-Unspecified 04/01/2015, 04/19/2016, 04/23/2017  . PPD Test 05/25/2015  . Pneumococcal Conjugate-13 07/01/2015  . Pneumococcal Polysaccharide-23 09/02/2015  . Zoster 09/30/1994   Pertinent  Health Maintenance Due  Topic Date Due  . INFLUENZA VACCINE  Completed  . DEXA SCAN  Completed  . PNA vac Low Risk Adult  Completed   Fall Risk  03/20/2017 12/09/2015 09/23/2015 09/09/2015 02/03/2015  Falls in the past year? No No Yes Yes Yes  Number falls in past yr: - - 2 or more 2 or more 2 or more  Comment - - - - -  Injury with Fall? - - No No -   Functional Status Survey:    Vitals:   05/29/17 1427  BP: (!) 160/76  Pulse: 100  Resp: 18  Temp: 98 F (36.7 C)  SpO2: 94%  Weight: 130 lb 8 oz (59.2 kg)  Height: 5\' 1"  (1.549 m)   Body mass index is 24.66 kg/m. Physical Exam  Constitutional: She is oriented to person, place, and time. She appears well-developed and well-nourished.  HENT:  Head: Normocephalic and atraumatic.  Neck: Normal range of motion. Neck supple. No JVD present. No thyromegaly present.  Cardiovascular: Normal rate, regular rhythm and normal heart sounds.  No murmur heard. Pulmonary/Chest: Effort normal and breath sounds normal. She has no wheezes. She has no rales.  Decreased air entry  Abdominal: Soft. Bowel sounds are normal. She exhibits no distension. There is no tenderness.  Musculoskeletal: Normal range of motion. She exhibits no edema or tenderness.  Neurological: She is alert and oriented to person, place,  and time. She exhibits normal muscle tone. Coordination normal.  Skin: Skin is warm and dry. No rash noted. No erythema.  Psychiatric: She has a normal mood and affect. Her behavior is normal. Judgment and thought content normal.    Labs reviewed: Recent Labs    05/11/17 1815 05/12/17 0328 05/15/17 1100  NA 134* 141 139  K 3.9 3.5 3.1*  CL 105 113* 110  CO2 20* 22 25  GLUCOSE  100* 101* 121*  BUN 14 11 10   CREATININE 0.94 0.82 0.66  CALCIUM 9.3 8.7* 8.9   Recent Labs    03/29/17 1340  05/10/17 05/10/17 1142 05/11/17 1815  AST 39*   < > 21 33 42*  ALT 28   < > 18 24 30   ALKPHOS 120*   < > 124 150 153*  BILITOT 0.50  --   --  0.46 0.8  PROT 6.7  --   --  6.6 5.9*  ALBUMIN 3.1*  --   --  3.0* 3.1*   < > = values in this interval not displayed.   Recent Labs    05/10/17 1142 05/11/17 1815 05/12/17 0328 05/15/17 1100  WBC 3.1* 4.4 4.2 3.8*  NEUTROABS 2.4 3.6  --  2.9  HGB 9.5* 8.5* 8.2* 8.4*  HCT 28.0* 24.5* 24.2* 25.6*  MCV 110* 105.6* 106.6* 108.0*  PLT 327 327 296 326   Lab Results  Component Value Date   TSH 1.56 05/13/2015   Lab Results  Component Value Date   HGBA1C 5.8 (H) 07/28/2014   No results found for: CHOL, HDL, LDLCALC, LDLDIRECT, TRIG, CHOLHDL  Significant Diagnostic Results in last 30 days:  Dg Chest 2 View  Result Date: 05/14/2017 CLINICAL DATA:  Productive cough. History of asthma, bronchitis, previous episodes of pneumonia, remote history of smoking. EXAM: CHEST  2 VIEW COMPARISON:  Chest x-ray of May 11, 2017 FINDINGS: The right lung is adequately inflated. There is no focal infiltrate. On the left there is patchy increased density at the base with partial obscuration of the hemidiaphragm. The heart is normal in size. The pulmonary vascularity is not engorged. There is calcification in the wall of the thoracic aorta. The bony thorax exhibits no acute abnormality. IMPRESSION: Left basilar atelectasis or pneumonia. Underlying chronic  bronchitic changes. Followup PA and lateral chest X-ray is recommended in 3-4 weeks following trial of antibiotic therapy to ensure resolution and exclude underlying malignancy. Thoracic aortic atherosclerosis. Electronically Signed   By: David  Martinique M.D.   On: 05/14/2017 13:24   Dg Chest 2 View  Result Date: 05/11/2017 CLINICAL DATA:  Initial evaluation for acute shortness of breath and fever. EXAM: CHEST  2 VIEW COMPARISON:  Prior radiograph from 02/01/2017. FINDINGS: Cardiomegaly, stable from previous. Mediastinal silhouette within normal limits. Aortic atherosclerosis. Lungs are hypoinflated. Patchy left lower lobe infiltrate, concerning for pneumonia given provided history. Streaky right basilar atelectasis. Perihilar vascular congestion without overt pulmonary edema. No pleural effusion. No pneumothorax. No acute osseus abnormality. Degenerative changes about the left shoulder. IMPRESSION: 1. Patchy left lower lobe infiltrate, concerning for pneumonia given provided history. 2. Stable cardiomegaly with mild perihilar vascular congestion without frank pulmonary edema. Electronically Signed   By: Jeannine Boga M.D.   On: 05/11/2017 19:43    Assessment/Plan: Hypokalemia dropping of serum Potassium, 05/29/17 K 3.2. She denied constipation, increased fatigue, muscle spasm, or increased SOB. Will add Kcl 59meq po qd, f/u BMP   Protein-calorie malnutrition ([place]) 05/29/17 showed total protein 4.7, albumin 2.5, dietary consultation.    Anemia of chronic disease Chronic anemia, periodic Fe infusion at Hematology, Hgb 9.8 05/29/17.   WEGENERS GRANULOMATOSIS Cytoxan was discontinued per Rheumatology 05/21/17. Septra DS 3x/week resumed 05/28/17     Family/ staff Communication: plan of care reviewed with the patient and charge nurse  Labs/tests ordered: BMP  Time spend 25 minutes.

## 2017-05-29 NOTE — Assessment & Plan Note (Signed)
Chronic anemia, periodic Fe infusion at Hematology, Hgb 9.8 05/29/17.

## 2017-05-29 NOTE — Assessment & Plan Note (Signed)
dropping of serum Potassium, 05/29/17 K 3.2. She denied constipation, increased fatigue, muscle spasm, or increased SOB. Will add Kcl 57meq po qd, f/u BMP

## 2017-05-31 LAB — BASIC METABOLIC PANEL
BUN: 11 (ref 4–21)
Calcium: 9
Creat: 0.88
GLUCOSE: 83
POTASSIUM: 3.1
SODIUM: 141

## 2017-05-31 LAB — PROTIME-INR
INR: 3 — AB (ref 0.9–1.1)
PT: 31.8

## 2017-06-04 ENCOUNTER — Encounter: Payer: Self-pay | Admitting: Nurse Practitioner

## 2017-06-04 ENCOUNTER — Other Ambulatory Visit: Payer: Self-pay | Admitting: *Deleted

## 2017-06-04 ENCOUNTER — Non-Acute Institutional Stay (SKILLED_NURSING_FACILITY): Payer: Medicare Other | Admitting: Nurse Practitioner

## 2017-06-04 DIAGNOSIS — N3281 Overactive bladder: Secondary | ICD-10-CM

## 2017-06-04 DIAGNOSIS — M3130 Wegener's granulomatosis without renal involvement: Secondary | ICD-10-CM | POA: Diagnosis not present

## 2017-06-04 DIAGNOSIS — I471 Supraventricular tachycardia, unspecified: Secondary | ICD-10-CM | POA: Insufficient documentation

## 2017-06-04 DIAGNOSIS — Z7901 Long term (current) use of anticoagulants: Secondary | ICD-10-CM

## 2017-06-04 DIAGNOSIS — K219 Gastro-esophageal reflux disease without esophagitis: Secondary | ICD-10-CM | POA: Diagnosis not present

## 2017-06-04 DIAGNOSIS — D500 Iron deficiency anemia secondary to blood loss (chronic): Secondary | ICD-10-CM

## 2017-06-04 DIAGNOSIS — G479 Sleep disorder, unspecified: Secondary | ICD-10-CM | POA: Diagnosis not present

## 2017-06-04 LAB — BASIC METABOLIC PANEL
BUN: 8 (ref 4–21)
CREATININE: 1 (ref <=1.1)
Glucose: 83
Potassium: 4.1 (ref 3.4–5.3)
Sodium: 140 (ref 137–147)

## 2017-06-04 LAB — POCT INR: INR: 3.9 — AB (ref <=1.1)

## 2017-06-04 LAB — PROTIME-INR: PROTIME: 41.1 — AB (ref 10.0–13.8)

## 2017-06-04 NOTE — Progress Notes (Signed)
Location:   [facility] Room Number: 17 Place of Service:  SNF (31) Provider: Lennie Odor Shiron Whetsel NP  Blanchie Serve, MD  [name], MD as PCP - General (Internal Medicine) Unice Bailey, MD as Consulting Physician (Rheumatology) Rozetta Nunnery, MD as Consulting Physician (Otolaryngology) Marcial Pacas, MD as Consulting Physician (Neurology) Michel Bickers, MD as Consulting Physician (Infectious Diseases) Marin Olp Rudell Cobb, MD as Consulting Physician (Oncology) Juanito Doom, MD as Consulting Physician (Pulmonary Disease) Tanda Rockers, MD as Consulting Physician (Pulmonary Disease) Estill Dooms, MD as Consulting Physician (Geriatric Medicine) Coralie Keens, MD as Consulting Physician (General Surgery) Aaminah Forrester X, NP as Nurse Practitioner (Nurse Practitioner)  Extended Emergency Contact Information Primary Emergency Contact: [facility] Address: [place], [place] [address] [place] Phone: [phone] Mobile Phone: [phone] Relation: Daughter Secondary Emergency Contact: Clendenning,[name] Address: [address] [place]          [place], [place] [address] [place] Phone: [phone] Mobile Phone: [phone] Relation: Son  Code Status:  DNR Goals of care: Advanced Directive information Advanced Directives 05/17/2017  Does Patient Have a Medical Advance Directive? Yes  Type of Paramedic of [place];Living will  Does patient want to make changes to medical advance directive? No - Patient declined  Copy of [place] in Chart? Yes  Would patient like information on creating a medical advance directive? -     Chief Complaint  Patient presents with  . Medical Management of Chronic Issues    HPI:  Pt is a 83 y.o. female seen today for medical management of chronic diseases.     Incidental finding of rapid heart beats  110s at rest, the patient denied palpitation, SOB, chest pain/pressrues/disocmofort. She has Hx of PE, chronic RLE DVT in mid femoral vein, currently taking Coumadin 3mg  qd since 06/01/17, last INR 3.0 05/31/17, pending PT/INR today. Chronic anemia, periodic Fe infusion at Hematology, Hgb 9.8 05/29/17. Insomnia, stable, takes Lorazepam 0.5mg  qhs, Trazodone 50mg  qhs. Hx of Wegeners granulomatosis: on long term Septra DS 800/160 x3/week for infection suppression therapy, Advair q12hr, Mucinex 600mg  bid, Atrovent Neb tid, OAB, no urinary retention while on Ditropan 5mg  bid, GERD stable on Prilosec 20mg  qd     Past Medical History:  Diagnosis Date  . Acute respiratory failure with hypoxia ([place])   . Anemia   . Aspergillosis ([place])   . Bladder incontinence   . Blindness of one eye   . Cancer ([place])    melanoma   ([place])  . Chronic sinusitis   . Clotting disorder ([place])   . Difficult intubation    Difficult airway with intubation for ARF 07/22/14 (due to anterior larynx, also had mucous plug)  . Fatigue 12/09/2015  . Glaucoma   . Hearing loss in left ear    hearing aid both ears  . History of pulmonary embolism 1997  . Interstitial emphysema ([place])   . Left shoulder pain 12/09/2015  . Multiple allergies   . Neuropathy   . OA (osteoarthritis)   . Pneumonia 12/15  . Shortness of breath dyspnea   . Stroke [facility])    ??? blind left eye  . Wegener's granulomatosis ([place])    Past Surgical History:  Procedure Laterality Date  . bronchosocpy     pt. states she has had over 10 in last 20 yrs.  Marland Kitchen CATARACT EXTRACTION    .  COCHLEAR IMPLANT Left 10/27/2015   Procedure: LEFT COCHLEAR IMPLANT;  Surgeon: Leta Baptist, MD;  Location: [facility];  Service: ENT;  Laterality: Left;  . INGUINAL HERNIA REPAIR Left 02/17/2014   Procedure: LEFT INGUINAL HERNIA REPAIR WITH MESH;  Surgeon: Harl Bowie, MD;  Location: [facility];  Service: General;  Laterality: Left;  . INSERTION OF MESH N/A 02/17/2014    Procedure: INSERTION OF MESH;  Surgeon: Harl Bowie, MD;  Location: [facility];  Service: General;  Laterality: N/A;  . KNEE ARTHROSCOPY  2006   rt   . MELANOMA EXCISION     left leg  . PUBOVAGINAL SLING    . TEAR DUCT PROBING    . TUBAL LIGATION    . TYMPANOMASTOIDECTOMY Left 04/28/2015  . TYMPANOMASTOIDECTOMY Left 04/28/2015   Procedure: LEFT TYMPANOMASTOIDECTOMY;  Surgeon: Leta Baptist, MD;  Location: [facility];  Service: ENT;  Laterality: Left;  Marland Kitchen VENA CAVA FILTER PLACEMENT  1997   [place] filter    Allergies  Allergen Reactions  . Adhesive [Tape] Rash    On on lower extremities     Allergies as of 06/04/2017      Reactions   Adhesive [tape] Rash   On on lower extremities       Medication List        Accurate as of 06/04/17 11:59 PM. Always use your most recent med list.          acetaminophen 650 MG CR tablet Commonly known as:  TYLENOL Take 650 mg by mouth at bedtime. Can also take 650 mg by mouth 2 times daily as needed   calcium-vitamin D 500-200 MG-UNIT tablet Commonly known as:  OSCAL WITH D Take 1 tablet by mouth daily.   cholecalciferol 1000 units tablet Commonly known as:  VITAMIN D One daily for vitamin D supplement   diclofenac sodium 1 % Gel Commonly known as:  VOLTAREN Apply 2 g topically 3 (three) times daily. Apply to left shoulder.   Fluticasone-Salmeterol 500-50 MCG/DOSE Aepb Commonly known as:  ADVAIR DISKUS INHALE 1 PUFF EVERY 12 HOURS.   folic acid 1 MG tablet Commonly known as:  FOLVITE Take 1 mg by mouth daily.   gabapentin 100 MG capsule Commonly known as:  NEURONTIN Take 100 mg by mouth 3 (three) times daily.   guaiFENesin 600 MG 12 hr tablet Commonly known as:  MUCINEX Take 600 mg by mouth 2 (two) times daily.   guaiFENesin-dextromethorphan 100-10 MG/5ML syrup Commonly known as:  ROBITUSSIN DM Take 5 mLs every 4 (four) hours as needed by mouth for cough.   hydrocortisone cream 1 % Apply 1 application  topically 4 (four) times daily as needed for itching (in addition to the scheduled does).   ipratropium 0.02 % nebulizer solution Commonly known as:  ATROVENT Take 2.5 mLs (0.5 mg total) 3 (three) times daily by nebulization.   latanoprost 0.005 % ophthalmic solution Commonly known as:  XALATAN Place 1 drop into both eyes at bedtime.   levalbuterol 0.63 MG/3ML nebulizer solution Commonly known as:  XOPENEX Take 0.63 mg by nebulization every 6 (six) hours as needed for wheezing or shortness of breath.   LORazepam 0.5 MG tablet Commonly known as:  ATIVAN Take 1 tablet (0.5 mg total) by mouth at bedtime.   NAPHCON-A 0.025-0.3 % ophthalmic solution Generic drug:  naphazoline-pheniramine Place 1 drop 3 (three) times daily into the left eye.   omeprazole 20 MG capsule Commonly known as:  PRILOSEC Take  20 mg by mouth daily.   oxybutynin 5 MG tablet Commonly known as:  DITROPAN One twice daily to help bladder control   saccharomyces boulardii 250 MG capsule Commonly known as:  FLORASTOR Take 250 mg by mouth daily.   SORE THROAT SPRAY 1.4 % Liqd Generic drug:  phenol Use as directed 1 spray every 8 (eight) hours as needed in the mouth or throat for throat irritation / pain.   sulfamethoxazole-trimethoprim 800-160 MG tablet Commonly known as:  BACTRIM DS,SEPTRA DS Take 1 tablet by mouth 3 (three) times a week. Mon, Wed, Fri   THERAVIM-M Tabs Take 1 tablet by mouth daily.   traZODone 50 MG tablet Commonly known as:  DESYREL Take 50 mg by mouth at bedtime.   warfarin 3 MG tablet Commonly known as:  COUMADIN Take 3 mg by mouth daily.       Review of Systems  Constitutional: Negative for activity change, appetite change, chills, diaphoresis, fatigue and fever.  HENT: Positive for hearing loss. Negative for congestion, trouble swallowing and voice change.   Eyes: Positive for visual disturbance.       Blind left eye  Respiratory: Positive for cough. Negative for choking,  chest tightness, shortness of breath and wheezing.        Chronic  Cardiovascular: Negative for chest pain, palpitations and leg swelling.  Gastrointestinal: Negative for abdominal distention, abdominal pain, blood in stool, constipation, diarrhea, nausea and vomiting.  Endocrine: Negative for cold intolerance.  Genitourinary: Negative for difficulty urinating, dysuria, frequency and urgency.  Musculoskeletal: Positive for arthralgias and gait problem. Negative for myalgias.       Ambulates with walker, motorized scooter to go further.   Skin: Negative for color change, pallor, rash and wound.  Neurological: Negative for tremors, speech difficulty, weakness and headaches.       Neuropathic pain in BLE  Psychiatric/Behavioral: Negative for behavioral problems, confusion and sleep disturbance. The patient is not nervous/anxious.        Sleeps with Trazodone and Lorazepam.     Immunization History  Administered Date(s) Administered  . Influenza Split 03/04/2012, 03/11/2014, 04/01/2015  . Influenza,inj,Quad PF,6+ Mos 03/03/2013  . Influenza-Unspecified 04/01/2015, 04/19/2016, 04/23/2017  . PPD Test 05/25/2015  . Pneumococcal Conjugate-13 07/01/2015  . Pneumococcal Polysaccharide-23 09/02/2015  . Zoster 09/30/1994   Pertinent  Health Maintenance Due  Topic Date Due  . INFLUENZA VACCINE  Completed  . DEXA SCAN  Completed  . PNA vac Low Risk Adult  Completed   Fall Risk  03/20/2017 12/09/2015 09/23/2015 09/09/2015 02/03/2015  Falls in the past year? No No Yes Yes Yes  Number falls in past yr: - - 2 or more 2 or more 2 or more  Comment - - - - -  Injury with Fall? - - No No -   Functional Status Survey:    Vitals:   06/04/17 1327  BP: 130/66  Pulse: 72  Resp: 20  Temp: 98 F (36.7 C)  SpO2: 95%  Weight: 131 lb 6.4 oz (59.6 kg)  Height: 5\' 1"  (1.549 m)   Body mass index is 24.83 kg/m. Physical Exam  Constitutional: She is oriented to person, place, and time. She appears  well-developed and well-nourished. No distress.  HENT:  Head: Normocephalic and atraumatic.  Eyes: Conjunctivae are normal. Right eye exhibits no discharge. No scleral icterus.  Left eye blind  Neck: Normal range of motion. Neck supple. No JVD present. No thyromegaly present.  Cardiovascular: Regular rhythm and normal heart sounds.  No murmur heard. HR 110s regular  Pulmonary/Chest: Effort normal. She has no wheezes. She has rales.  Posterior mid to lower scattered rales.   Abdominal: Soft. Bowel sounds are normal. She exhibits no distension. There is no tenderness.  Musculoskeletal: Normal range of motion. She exhibits no edema or tenderness.  Neurological: She is alert and oriented to person, place, and time. No cranial nerve deficit. Coordination normal.  Skin: Skin is warm and dry. No rash noted. She is not diaphoretic. No erythema. No pallor.  Psychiatric: She has a normal mood and affect. Her behavior is normal. Judgment and thought content normal.    Labs reviewed: Recent Labs    05/12/17 0328 05/15/17 1100 06/04/17 06/05/17 1026  NA 141 139 140 143  K 3.5 3.1* 4.1 4.5  CL 113* 110  --  107  CO2 22 25  --  25  GLUCOSE 101* 121*  --  136*  BUN 11 10 8 7   CREATININE 0.82 0.66 1.0 1.0  CALCIUM 8.7* 8.9  --  9.6   Recent Labs    05/10/17 1142 05/11/17 1815 06/05/17 1026  AST 33 42* 41*  ALT 24 30 30   ALKPHOS 150 153* 128*  BILITOT 0.46 0.8 0.50  PROT 6.6 5.9* 6.1*  ALBUMIN 3.0* 3.1* 2.8*   Recent Labs    05/11/17 1815 05/12/17 0328 05/15/17 1100 06/05/17 1026  WBC 4.4 4.2 3.8* 6.4  NEUTROABS 3.6  --  2.9 5.0  HGB 8.5* 8.2* 8.4* 10.3*  HCT 24.5* 24.2* 25.6* 31.3*  MCV 105.6* 106.6* 108.0* 113*  PLT 327 296 326 296   Lab Results  Component Value Date   TSH 1.56 05/13/2015   Lab Results  Component Value Date   HGBA1C 5.8 (H) 07/28/2014   No results found for: CHOL, HDL, LDLCALC, LDLDIRECT, TRIG, CHOLHDL  Significant Diagnostic Results in last 30  days:  Dg Chest 2 View  Result Date: 05/14/2017 CLINICAL DATA:  Productive cough. History of asthma, bronchitis, previous episodes of pneumonia, remote history of smoking. EXAM: CHEST  2 VIEW COMPARISON:  Chest x-ray of May 11, 2017 FINDINGS: The right lung is adequately inflated. There is no focal infiltrate. On the left there is patchy increased density at the base with partial obscuration of the hemidiaphragm. The heart is normal in size. The pulmonary vascularity is not engorged. There is calcification in the wall of the thoracic aorta. The bony thorax exhibits no acute abnormality. IMPRESSION: Left basilar atelectasis or pneumonia. Underlying chronic bronchitic changes. Followup PA and lateral chest X-ray is recommended in 3-4 weeks following trial of antibiotic therapy to ensure resolution and exclude underlying malignancy. Thoracic aortic atherosclerosis. Electronically Signed   By: David  Martinique M.D.   On: 05/14/2017 13:24   Dg Chest 2 View  Result Date: 05/11/2017 CLINICAL DATA:  Initial evaluation for acute shortness of breath and fever. EXAM: CHEST  2 VIEW COMPARISON:  Prior radiograph from 02/01/2017. FINDINGS: Cardiomegaly, stable from previous. Mediastinal silhouette within normal limits. Aortic atherosclerosis. Lungs are hypoinflated. Patchy left lower lobe infiltrate, concerning for pneumonia given provided history. Streaky right basilar atelectasis. Perihilar vascular congestion without overt pulmonary edema. No pleural effusion. No pneumothorax. No acute osseus abnormality. Degenerative changes about the left shoulder. IMPRESSION: 1. Patchy left lower lobe infiltrate, concerning for pneumonia given provided history. 2. Stable cardiomegaly with mild perihilar vascular congestion without frank pulmonary edema. Electronically Signed   By: Jeannine Boga M.D.   On: 05/11/2017 19:43    Assessment/Plan  Paroxysmal  supraventricular tachycardia (HCC) Heart rate in 110s,  asymptomatic, obtain EKG, CBC/diff, CMP, EKG 06/05/17 Afib, Vent rate 123bpm, given her comorbidity, ED eval, heart rate/rhythm control  Long term current use of anticoagulant therapy Hx of PE, chronic RLE DVT in mid femoral vein, currently taking Coumadin 3mg  qd since 06/01/17, last INR 3.0 05/31/17, pending PT/INR today  Iron deficiency anemia due to chronic blood loss Chronic anemia, periodic Fe infusion at Hematology, Hgb 9.8 05/29/17.    Sleep disorder  Insomnia, stable, cotninueTrazodone 50mg  qhs and Lorazepam 0.5mg  qhs.   WEGENERS GRANULOMATOSIS on long term Septra DS 800/160 x3/week for infection suppression therapy, Advair q12hr, Mucinex 600mg  bid, Atrovent Neb tid, observe.     GERD (gastroesophageal reflux disease) GERD stable on Prilosec 20mg  qd   OAB (overactive bladder) OAB, no urinary retention while on Ditropan 5mg  bid   Family/ staff Communication: plan of care reviewed with the patient and charge nurse. ED eval  Labs/tests ordered:  EKG, CBC/diff, CMP  Time spend 25 minutes.

## 2017-06-04 NOTE — Assessment & Plan Note (Addendum)
Heart rate in 110s, asymptomatic, obtain EKG, CBC/diff, CMP, EKG 06/05/17 Afib, Vent rate 123bpm, given her comorbidity, ED eval, heart rate/rhythm control

## 2017-06-04 NOTE — Assessment & Plan Note (Addendum)
on long term Septra DS 800/160 x3/week for infection suppression therapy, Advair q12hr, Mucinex 600mg  bid, Atrovent Neb tid, observe.

## 2017-06-04 NOTE — Assessment & Plan Note (Signed)
OAB, no urinary retention while on Ditropan 5mg  bid

## 2017-06-04 NOTE — Assessment & Plan Note (Signed)
Hx of PE, chronic RLE DVT in mid femoral vein, currently taking Coumadin 3mg  qd since 06/01/17, last INR 3.0 05/31/17, pending PT/INR today

## 2017-06-04 NOTE — Assessment & Plan Note (Addendum)
Insomnia, stable, cotninueTrazodone 50mg  qhs and Lorazepam 0.5mg  qhs.

## 2017-06-04 NOTE — Assessment & Plan Note (Signed)
Chronic anemia, periodic Fe infusion at Hematology, Hgb 9.8 05/29/17.

## 2017-06-04 NOTE — Assessment & Plan Note (Signed)
GERD stable on Prilosec 20mg  qd

## 2017-06-05 ENCOUNTER — Other Ambulatory Visit: Payer: Self-pay | Admitting: *Deleted

## 2017-06-05 ENCOUNTER — Ambulatory Visit: Payer: Medicare Other

## 2017-06-05 ENCOUNTER — Emergency Department (HOSPITAL_COMMUNITY): Payer: Medicare Other

## 2017-06-05 ENCOUNTER — Other Ambulatory Visit: Payer: Self-pay

## 2017-06-05 ENCOUNTER — Encounter (HOSPITAL_COMMUNITY): Payer: Self-pay | Admitting: Emergency Medicine

## 2017-06-05 ENCOUNTER — Inpatient Hospital Stay (HOSPITAL_COMMUNITY)
Admission: EM | Admit: 2017-06-05 | Discharge: 2017-06-08 | DRG: 187 | Disposition: A | Discharge status: Skilled Nursing Facility | Payer: Medicare Other | Attending: Internal Medicine | Admitting: Internal Medicine

## 2017-06-05 ENCOUNTER — Other Ambulatory Visit (HOSPITAL_BASED_OUTPATIENT_CLINIC_OR_DEPARTMENT_OTHER): Payer: Medicare Other

## 2017-06-05 DIAGNOSIS — J982 Interstitial emphysema: Secondary | ICD-10-CM | POA: Diagnosis present

## 2017-06-05 DIAGNOSIS — D509 Iron deficiency anemia, unspecified: Secondary | ICD-10-CM

## 2017-06-05 DIAGNOSIS — H409 Unspecified glaucoma: Secondary | ICD-10-CM | POA: Diagnosis present

## 2017-06-05 DIAGNOSIS — Y95 Nosocomial condition: Secondary | ICD-10-CM | POA: Diagnosis present

## 2017-06-05 DIAGNOSIS — Z7901 Long term (current) use of anticoagulants: Secondary | ICD-10-CM

## 2017-06-05 DIAGNOSIS — Z8049 Family history of malignant neoplasm of other genital organs: Secondary | ICD-10-CM

## 2017-06-05 DIAGNOSIS — Z91048 Other nonmedicinal substance allergy status: Secondary | ICD-10-CM

## 2017-06-05 DIAGNOSIS — Z66 Do not resuscitate: Secondary | ICD-10-CM | POA: Diagnosis present

## 2017-06-05 DIAGNOSIS — H5462 Unqualified visual loss, left eye, normal vision right eye: Secondary | ICD-10-CM | POA: Diagnosis present

## 2017-06-05 DIAGNOSIS — G4700 Insomnia, unspecified: Secondary | ICD-10-CM | POA: Diagnosis present

## 2017-06-05 DIAGNOSIS — M3130 Wegener's granulomatosis without renal involvement: Secondary | ICD-10-CM | POA: Diagnosis present

## 2017-06-05 DIAGNOSIS — Z8673 Personal history of transient ischemic attack (TIA), and cerebral infarction without residual deficits: Secondary | ICD-10-CM

## 2017-06-05 DIAGNOSIS — I471 Supraventricular tachycardia: Secondary | ICD-10-CM | POA: Diagnosis present

## 2017-06-05 DIAGNOSIS — R5383 Other fatigue: Secondary | ICD-10-CM | POA: Diagnosis present

## 2017-06-05 DIAGNOSIS — J90 Pleural effusion, not elsewhere classified: Secondary | ICD-10-CM | POA: Diagnosis not present

## 2017-06-05 DIAGNOSIS — J329 Chronic sinusitis, unspecified: Secondary | ICD-10-CM | POA: Diagnosis present

## 2017-06-05 DIAGNOSIS — D638 Anemia in other chronic diseases classified elsewhere: Secondary | ICD-10-CM | POA: Diagnosis present

## 2017-06-05 DIAGNOSIS — R000 Tachycardia, unspecified: Secondary | ICD-10-CM

## 2017-06-05 DIAGNOSIS — J45909 Unspecified asthma, uncomplicated: Secondary | ICD-10-CM | POA: Diagnosis present

## 2017-06-05 DIAGNOSIS — Z86718 Personal history of other venous thrombosis and embolism: Secondary | ICD-10-CM

## 2017-06-05 DIAGNOSIS — Z86711 Personal history of pulmonary embolism: Secondary | ICD-10-CM | POA: Diagnosis present

## 2017-06-05 DIAGNOSIS — I493 Ventricular premature depolarization: Secondary | ICD-10-CM | POA: Diagnosis present

## 2017-06-05 DIAGNOSIS — K219 Gastro-esophageal reflux disease without esophagitis: Secondary | ICD-10-CM | POA: Diagnosis present

## 2017-06-05 DIAGNOSIS — Z8614 Personal history of Methicillin resistant Staphylococcus aureus infection: Secondary | ICD-10-CM

## 2017-06-05 DIAGNOSIS — Z79899 Other long term (current) drug therapy: Secondary | ICD-10-CM

## 2017-06-05 DIAGNOSIS — R918 Other nonspecific abnormal finding of lung field: Secondary | ICD-10-CM | POA: Diagnosis present

## 2017-06-05 DIAGNOSIS — Z8701 Personal history of pneumonia (recurrent): Secondary | ICD-10-CM

## 2017-06-05 DIAGNOSIS — Z808 Family history of malignant neoplasm of other organs or systems: Secondary | ICD-10-CM

## 2017-06-05 DIAGNOSIS — D500 Iron deficiency anemia secondary to blood loss (chronic): Secondary | ICD-10-CM

## 2017-06-05 DIAGNOSIS — E860 Dehydration: Secondary | ICD-10-CM | POA: Diagnosis present

## 2017-06-05 DIAGNOSIS — I517 Cardiomegaly: Secondary | ICD-10-CM | POA: Diagnosis present

## 2017-06-05 DIAGNOSIS — J189 Pneumonia, unspecified organism: Secondary | ICD-10-CM

## 2017-06-05 DIAGNOSIS — H9192 Unspecified hearing loss, left ear: Secondary | ICD-10-CM | POA: Diagnosis present

## 2017-06-05 DIAGNOSIS — R1313 Dysphagia, pharyngeal phase: Secondary | ICD-10-CM | POA: Diagnosis present

## 2017-06-05 DIAGNOSIS — J9811 Atelectasis: Secondary | ICD-10-CM | POA: Diagnosis present

## 2017-06-05 DIAGNOSIS — I4891 Unspecified atrial fibrillation: Secondary | ICD-10-CM | POA: Diagnosis present

## 2017-06-05 DIAGNOSIS — H9190 Unspecified hearing loss, unspecified ear: Secondary | ICD-10-CM | POA: Diagnosis present

## 2017-06-05 DIAGNOSIS — E559 Vitamin D deficiency, unspecified: Secondary | ICD-10-CM | POA: Diagnosis present

## 2017-06-05 DIAGNOSIS — Z85820 Personal history of malignant melanoma of skin: Secondary | ICD-10-CM

## 2017-06-05 DIAGNOSIS — Z8249 Family history of ischemic heart disease and other diseases of the circulatory system: Secondary | ICD-10-CM

## 2017-06-05 DIAGNOSIS — N3281 Overactive bladder: Secondary | ICD-10-CM | POA: Diagnosis present

## 2017-06-05 DIAGNOSIS — F411 Generalized anxiety disorder: Secondary | ICD-10-CM | POA: Diagnosis present

## 2017-06-05 DIAGNOSIS — Z8619 Personal history of other infectious and parasitic diseases: Secondary | ICD-10-CM | POA: Diagnosis present

## 2017-06-05 DIAGNOSIS — Z823 Family history of stroke: Secondary | ICD-10-CM

## 2017-06-05 DIAGNOSIS — Z807 Family history of other malignant neoplasms of lymphoid, hematopoietic and related tissues: Secondary | ICD-10-CM

## 2017-06-05 DIAGNOSIS — Z87891 Personal history of nicotine dependence: Secondary | ICD-10-CM

## 2017-06-05 DIAGNOSIS — Z820 Family history of epilepsy and other diseases of the nervous system: Secondary | ICD-10-CM

## 2017-06-05 LAB — CMP (CANCER CENTER ONLY)
ALK PHOS: 128 U/L — AB (ref 26–84)
ALT: 30 U/L (ref 10–47)
AST: 41 U/L — AB (ref 11–38)
Albumin: 2.8 g/dL — ABNORMAL LOW (ref 3.3–5.5)
BUN: 7 mg/dL (ref 7–22)
CO2: 25 mEq/L (ref 18–33)
CREATININE: 1 mg/dL (ref 0.6–1.2)
Calcium: 9.6 mg/dL (ref 8.0–10.3)
Chloride: 107 mEq/L (ref 98–108)
GLUCOSE: 136 mg/dL — AB (ref 73–118)
POTASSIUM: 4.5 meq/L (ref 3.3–4.7)
SODIUM: 143 meq/L (ref 128–145)
TOTAL PROTEIN: 6.1 g/dL — AB (ref 6.4–8.1)
Total Bilirubin: 0.5 mg/dl (ref 0.20–1.60)

## 2017-06-05 LAB — CBC WITH DIFFERENTIAL/PLATELET
BASOS PCT: 0 %
Basophils Absolute: 0 10*3/uL (ref 0.0–0.1)
Eosinophils Absolute: 0.2 10*3/uL (ref 0.0–0.7)
Eosinophils Relative: 2 %
HEMATOCRIT: 32.6 % — AB (ref 36.0–46.0)
HEMOGLOBIN: 10.4 g/dL — AB (ref 12.0–15.0)
LYMPHS ABS: 1.1 10*3/uL (ref 0.7–4.0)
LYMPHS PCT: 15 %
MCH: 34.8 pg — AB (ref 26.0–34.0)
MCHC: 31.9 g/dL (ref 30.0–36.0)
MCV: 109 fL — AB (ref 78.0–100.0)
MONOS PCT: 5 %
Monocytes Absolute: 0.4 10*3/uL (ref 0.1–1.0)
NEUTROS ABS: 5.7 10*3/uL (ref 1.7–7.7)
NEUTROS PCT: 78 %
Platelets: 317 10*3/uL (ref 150–400)
RBC: 2.99 MIL/uL — ABNORMAL LOW (ref 3.87–5.11)
RDW: 17.2 % — ABNORMAL HIGH (ref 11.5–15.5)
WBC: 7.4 10*3/uL (ref 4.0–10.5)

## 2017-06-05 LAB — CBC WITH DIFFERENTIAL (CANCER CENTER ONLY)
BASO#: 0 10*3/uL (ref 0.0–0.2)
BASO%: 0.3 % (ref 0.0–2.0)
EOS ABS: 0.2 10*3/uL (ref 0.0–0.5)
EOS%: 2.7 % (ref 0.0–7.0)
HEMATOCRIT: 31.3 % — AB (ref 34.8–46.6)
HEMOGLOBIN: 10.3 g/dL — AB (ref 11.6–15.9)
LYMPH#: 0.4 10*3/uL — AB (ref 0.9–3.3)
LYMPH%: 6.4 % — ABNORMAL LOW (ref 14.0–48.0)
MCH: 37.2 pg — AB (ref 26.0–34.0)
MCHC: 32.9 g/dL (ref 32.0–36.0)
MCV: 113 fL — AB (ref 81–101)
MONO#: 0.8 10*3/uL (ref 0.1–0.9)
MONO%: 12.4 % (ref 0.0–13.0)
NEUT%: 78.2 % (ref 39.6–80.0)
NEUTROS ABS: 5 10*3/uL (ref 1.5–6.5)
Platelets: 296 10*3/uL (ref 145–400)
RBC: 2.77 10*6/uL — AB (ref 3.70–5.32)
RDW: 17.2 % — ABNORMAL HIGH (ref 11.1–15.7)
WBC: 6.4 10*3/uL (ref 3.9–10.0)

## 2017-06-05 LAB — COMPREHENSIVE METABOLIC PANEL
ALT: 23 U/L (ref 14–54)
ANION GAP: 7 (ref 5–15)
AST: 39 U/L (ref 15–41)
Albumin: 3 g/dL — ABNORMAL LOW (ref 3.5–5.0)
Alkaline Phosphatase: 129 U/L — ABNORMAL HIGH (ref 38–126)
BUN: 5 mg/dL — ABNORMAL LOW (ref 6–20)
CALCIUM: 9.7 mg/dL (ref 8.9–10.3)
CHLORIDE: 107 mmol/L (ref 101–111)
CO2: 23 mmol/L (ref 22–32)
Creatinine, Ser: 0.91 mg/dL (ref 0.44–1.00)
GFR calc non Af Amer: 57 mL/min — ABNORMAL LOW (ref >60)
Glucose, Bld: 119 mg/dL — ABNORMAL HIGH (ref 65–99)
Potassium: 4.2 mmol/L (ref 3.5–5.1)
SODIUM: 137 mmol/L (ref 135–145)
Total Bilirubin: 0.5 mg/dL (ref 0.3–1.2)
Total Protein: 6 g/dL — ABNORMAL LOW (ref 6.5–8.1)

## 2017-06-05 LAB — I-STAT TROPONIN, ED: TROPONIN I, POC: 0.01 ng/mL (ref 0.00–0.08)

## 2017-06-05 LAB — I-STAT CG4 LACTIC ACID, ED

## 2017-06-05 LAB — PROTIME-INR
INR: 3.74
PROTHROMBIN TIME: 36.7 s — AB (ref 11.4–15.2)

## 2017-06-05 LAB — IRON AND TIBC
%SAT: 46 % (ref 21–57)
Iron: 67 ug/dL (ref 41–142)
TIBC: 146 ug/dL — ABNORMAL LOW (ref 236–444)
UIBC: 79 ug/dL — AB (ref 120–384)

## 2017-06-05 LAB — FERRITIN: FERRITIN: 604 ng/mL — AB (ref 9–269)

## 2017-06-05 MED ORDER — SODIUM CHLORIDE 0.9 % IV BOLUS (SEPSIS)
500.0000 mL | Freq: Once | INTRAVENOUS | Status: AC
  Administered 2017-06-05: 500 mL via INTRAVENOUS

## 2017-06-05 MED ORDER — ACETAMINOPHEN 325 MG PO TABS
650.0000 mg | ORAL_TABLET | Freq: Once | ORAL | Status: AC
  Administered 2017-06-05: 650 mg via ORAL
  Filled 2017-06-05: qty 2
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
## 2017-06-05 NOTE — ED Notes (Signed)
All documentation done in last 5 minutes by Michelene Heady RN

## 2017-06-05 NOTE — ED Notes (Signed)
Pt very hard of hearing and does not understand all the questions.

## 2017-06-05 NOTE — ED Triage Notes (Signed)
Pt sent from NH by EMs because her MD office called after appointment and said her EKG was abnormal. EMS told Af[facility] RVR. ST noted on monitor. Pt arrives alert and oriented x 4.

## 2017-06-05 NOTE — ED Notes (Signed)
A 

## 2017-06-05 NOTE — ED Notes (Signed)
Spoke w/ lab who stated they would add on PT-INR to blood draws from earlier.

## 2017-06-06 ENCOUNTER — Emergency Department (HOSPITAL_COMMUNITY): Payer: Medicare Other

## 2017-06-06 ENCOUNTER — Other Ambulatory Visit: Payer: Self-pay

## 2017-06-06 DIAGNOSIS — J189 Pneumonia, unspecified organism: Secondary | ICD-10-CM | POA: Diagnosis present

## 2017-06-06 DIAGNOSIS — J329 Chronic sinusitis, unspecified: Secondary | ICD-10-CM | POA: Diagnosis present

## 2017-06-06 DIAGNOSIS — Z8249 Family history of ischemic heart disease and other diseases of the circulatory system: Secondary | ICD-10-CM | POA: Diagnosis not present

## 2017-06-06 DIAGNOSIS — I4891 Unspecified atrial fibrillation: Secondary | ICD-10-CM | POA: Diagnosis present

## 2017-06-06 DIAGNOSIS — Z86711 Personal history of pulmonary embolism: Secondary | ICD-10-CM | POA: Diagnosis not present

## 2017-06-06 DIAGNOSIS — J982 Interstitial emphysema: Secondary | ICD-10-CM | POA: Diagnosis present

## 2017-06-06 DIAGNOSIS — M3130 Wegener's granulomatosis without renal involvement: Secondary | ICD-10-CM | POA: Diagnosis present

## 2017-06-06 DIAGNOSIS — Z7901 Long term (current) use of anticoagulants: Secondary | ICD-10-CM | POA: Diagnosis not present

## 2017-06-06 DIAGNOSIS — H5462 Unqualified visual loss, left eye, normal vision right eye: Secondary | ICD-10-CM | POA: Diagnosis present

## 2017-06-06 DIAGNOSIS — E860 Dehydration: Secondary | ICD-10-CM | POA: Diagnosis present

## 2017-06-06 DIAGNOSIS — H9190 Unspecified hearing loss, unspecified ear: Secondary | ICD-10-CM | POA: Diagnosis present

## 2017-06-06 DIAGNOSIS — I471 Supraventricular tachycardia: Secondary | ICD-10-CM | POA: Diagnosis present

## 2017-06-06 DIAGNOSIS — Z66 Do not resuscitate: Secondary | ICD-10-CM | POA: Diagnosis present

## 2017-06-06 DIAGNOSIS — Z8701 Personal history of pneumonia (recurrent): Secondary | ICD-10-CM | POA: Diagnosis not present

## 2017-06-06 DIAGNOSIS — F411 Generalized anxiety disorder: Secondary | ICD-10-CM | POA: Diagnosis present

## 2017-06-06 DIAGNOSIS — Z8673 Personal history of transient ischemic attack (TIA), and cerebral infarction without residual deficits: Secondary | ICD-10-CM | POA: Diagnosis not present

## 2017-06-06 DIAGNOSIS — R000 Tachycardia, unspecified: Secondary | ICD-10-CM | POA: Diagnosis not present

## 2017-06-06 DIAGNOSIS — J90 Pleural effusion, not elsewhere classified: Secondary | ICD-10-CM

## 2017-06-06 DIAGNOSIS — N3281 Overactive bladder: Secondary | ICD-10-CM | POA: Diagnosis present

## 2017-06-06 DIAGNOSIS — H9193 Unspecified hearing loss, bilateral: Secondary | ICD-10-CM | POA: Diagnosis not present

## 2017-06-06 DIAGNOSIS — J9811 Atelectasis: Secondary | ICD-10-CM | POA: Diagnosis present

## 2017-06-06 DIAGNOSIS — Z79899 Other long term (current) drug therapy: Secondary | ICD-10-CM | POA: Diagnosis not present

## 2017-06-06 DIAGNOSIS — D638 Anemia in other chronic diseases classified elsewhere: Secondary | ICD-10-CM | POA: Diagnosis present

## 2017-06-06 DIAGNOSIS — I517 Cardiomegaly: Secondary | ICD-10-CM | POA: Diagnosis not present

## 2017-06-06 DIAGNOSIS — K219 Gastro-esophageal reflux disease without esophagitis: Secondary | ICD-10-CM | POA: Diagnosis present

## 2017-06-06 DIAGNOSIS — E559 Vitamin D deficiency, unspecified: Secondary | ICD-10-CM | POA: Diagnosis present

## 2017-06-06 DIAGNOSIS — Z86718 Personal history of other venous thrombosis and embolism: Secondary | ICD-10-CM | POA: Diagnosis not present

## 2017-06-06 DIAGNOSIS — Y95 Nosocomial condition: Secondary | ICD-10-CM | POA: Diagnosis present

## 2017-06-06 LAB — RESPIRATORY PANEL BY PCR

## 2017-06-06 LAB — URINALYSIS, ROUTINE W REFLEX MICROSCOPIC
BILIRUBIN URINE: NEGATIVE
Bacteria, UA: NONE SEEN
GLUCOSE, UA: NEGATIVE mg/dL
KETONES UR: NEGATIVE mg/dL
LEUKOCYTES UA: NEGATIVE
NITRITE: NEGATIVE
PH: 5 (ref 5.0–8.0)
Protein, ur: NEGATIVE mg/dL
Specific Gravity, Urine: 1.009 (ref 1.005–1.030)
Squamous Epithelial / LPF: NONE SEEN

## 2017-06-06 LAB — EXPECTORATED SPUTUM ASSESSMENT W REFEX TO RESP CULTURE

## 2017-06-06 LAB — TSH: TSH: 1.16 u[IU]/mL (ref 0.350–4.500)

## 2017-06-06 LAB — EXPECTORATED SPUTUM ASSESSMENT W GRAM STAIN, RFLX TO RESP C

## 2017-06-06 LAB — PROCALCITONIN: Procalcitonin: <0.1 ng/mL

## 2017-06-06 LAB — LACTIC ACID, PLASMA
Lactic Acid, Venous: 1.3 mmol/L (ref 0.5–1.9)
Lactic Acid, Venous: 2.2 mmol/L (ref 0.5–1.9)

## 2017-06-06 LAB — STREP PNEUMONIAE URINARY ANTIGEN: Strep Pneumo Urinary Antigen: NEGATIVE

## 2017-06-06 LAB — BRAIN NATRIURETIC PEPTIDE: B Natriuretic Peptide: 89.8 pg/mL (ref 0.0–100.0)

## 2017-06-06 LAB — T4, FREE: Free T4: 0.75 ng/dL (ref 0.61–1.12)

## 2017-06-06 MED ORDER — GABAPENTIN 100 MG PO CAPS
100.0000 mg | ORAL_CAPSULE | Freq: Three times a day (TID) | ORAL | Status: DC
  Administered 2017-06-06 – 2017-06-08 (×7): 100 mg via ORAL
  Filled 2017-06-06 (×7): qty 1

## 2017-06-06 MED ORDER — PHENOL 1.4 % MT LIQD: 1.0000 | Freq: Three times a day (TID) | OROMUCOSAL | Status: DC | PRN

## 2017-06-06 MED ORDER — IOPAMIDOL (ISOVUE-370) INJECTION 76%
INTRAVENOUS | Status: AC
  Administered 2017-06-06: 100 mL
  Filled 2017-06-06: qty 100

## 2017-06-06 MED ORDER — SODIUM CHLORIDE 0.9 % IV SOLN: INTRAVENOUS | Status: DC

## 2017-06-06 MED ORDER — TRAZODONE HCL 50 MG PO TABS
50.0000 mg | ORAL_TABLET | Freq: Every day | ORAL | Status: DC
  Administered 2017-06-06 – 2017-06-07 (×2): 50 mg via ORAL
  Filled 2017-06-06 (×2): qty 1

## 2017-06-06 MED ORDER — POTASSIUM CHLORIDE CRYS ER 20 MEQ PO TBCR
40.0000 meq | EXTENDED_RELEASE_TABLET | Freq: Two times a day (BID) | ORAL | Status: DC
  Administered 2017-06-06 – 2017-06-07 (×2): 40 meq via ORAL
  Filled 2017-06-06 (×2): qty 2

## 2017-06-06 MED ORDER — NAPHAZOLINE-PHENIRAMINE 0.025-0.3 % OP SOLN
1.0000 [drp] | Freq: Three times a day (TID) | OPHTHALMIC | Status: DC
  Administered 2017-06-06 – 2017-06-08 (×6): 1 [drp] via OPHTHALMIC
  Filled 2017-06-06: qty 5

## 2017-06-06 MED ORDER — GUAIFENESIN ER 600 MG PO TB12
600.0000 mg | ORAL_TABLET | Freq: Two times a day (BID) | ORAL | Status: DC
  Administered 2017-06-06 – 2017-06-07 (×3): 600 mg via ORAL
  Filled 2017-06-06 (×3): qty 1

## 2017-06-06 MED ORDER — CALCIUM CARBONATE-VITAMIN D 500-200 MG-UNIT PO TABS
1.0000 | ORAL_TABLET | Freq: Every day | ORAL | Status: DC
  Administered 2017-06-06 – 2017-06-08 (×3): 1 via ORAL
  Filled 2017-06-06 (×3): qty 1

## 2017-06-06 MED ORDER — LORAZEPAM 0.5 MG PO TABS
0.5000 mg | ORAL_TABLET | Freq: Every day | ORAL | Status: DC
  Administered 2017-06-06 – 2017-06-07 (×2): 0.5 mg via ORAL
  Filled 2017-06-06 (×2): qty 1

## 2017-06-06 MED ORDER — WARFARIN - PHARMACIST DOSING INPATIENT: Freq: Every day | Status: DC

## 2017-06-06 MED ORDER — FLUTICASONE FUROATE-VILANTEROL 200-25 MCG/INH IN AEPB
1.0000 | INHALATION_SPRAY | Freq: Every day | RESPIRATORY_TRACT | Status: DC
  Administered 2017-06-07 – 2017-06-08 (×2): 1 via RESPIRATORY_TRACT
  Filled 2017-06-06: qty 28

## 2017-06-06 MED ORDER — VANCOMYCIN HCL IN DEXTROSE 1-5 GM/200ML-% IV SOLN
1000.0000 mg | Freq: Once | INTRAVENOUS | Status: AC
  Administered 2017-06-06: 1000 mg via INTRAVENOUS
  Filled 2017-06-06: qty 200

## 2017-06-06 MED ORDER — SACCHAROMYCES BOULARDII 250 MG PO CAPS
250.0000 mg | ORAL_CAPSULE | Freq: Every day | ORAL | Status: DC
  Administered 2017-06-06 – 2017-06-08 (×3): 250 mg via ORAL
  Filled 2017-06-06: qty 1

## 2017-06-06 MED ORDER — DICLOFENAC SODIUM 1 % TD GEL
2.0000 g | Freq: Three times a day (TID) | TRANSDERMAL | Status: DC
  Administered 2017-06-06 – 2017-06-08 (×6): 2 g via TOPICAL
  Filled 2017-06-06: qty 100

## 2017-06-06 MED ORDER — HYDROCODONE-ACETAMINOPHEN 5-325 MG PO TABS
1.0000 | ORAL_TABLET | ORAL | Status: DC | PRN
  Administered 2017-06-06 (×2): 1 via ORAL
  Filled 2017-06-06 (×2): qty 1

## 2017-06-06 MED ORDER — VANCOMYCIN HCL IN DEXTROSE 750-5 MG/150ML-% IV SOLN
750.0000 mg | INTRAVENOUS | Status: DC
  Administered 2017-06-07: 750 mg via INTRAVENOUS
  Filled 2017-06-06: qty 150

## 2017-06-06 MED ORDER — GUAIFENESIN-DM 100-10 MG/5ML PO SYRP: 5.0000 mL | ORAL_SOLUTION | ORAL | Status: DC | PRN

## 2017-06-06 MED ORDER — OXYBUTYNIN CHLORIDE 5 MG PO TABS
5.0000 mg | ORAL_TABLET | Freq: Two times a day (BID) | ORAL | Status: DC
Start: 2017-06-06 — End: 2017-06-08
  Administered 2017-06-06 – 2017-06-08 (×5): 5 mg via ORAL
  Filled 2017-06-06 (×6): qty 1

## 2017-06-06 MED ORDER — VITAMIN D 1000 UNITS PO TABS
1000.0000 [IU] | ORAL_TABLET | Freq: Every day | ORAL | Status: DC
  Administered 2017-06-06 – 2017-06-08 (×3): 1000 [IU] via ORAL
  Filled 2017-06-06 (×3): qty 1

## 2017-06-06 MED ORDER — ACETAMINOPHEN 500 MG PO TABS
500.0000 mg | ORAL_TABLET | Freq: Once | ORAL | Status: AC
  Administered 2017-06-06: 500 mg via ORAL
  Filled 2017-06-06: qty 1

## 2017-06-06 MED ORDER — SODIUM CHLORIDE 0.9 % IV SOLN
INTRAVENOUS | Status: AC
  Administered 2017-06-06: 09:00:00 via INTRAVENOUS

## 2017-06-06 MED ORDER — CALCIUM CARBONATE-VITAMIN D3 600-400 MG-UNIT PO TABS: 1.0000 | ORAL_TABLET | Freq: Every day | ORAL | Status: DC

## 2017-06-06 MED ORDER — LATANOPROST 0.005 % OP SOLN
1.0000 [drp] | Freq: Every day | OPHTHALMIC | Status: DC
  Administered 2017-06-06 – 2017-06-07 (×2): 1 [drp] via OPHTHALMIC
  Filled 2017-06-06: qty 2.5

## 2017-06-06 MED ORDER — PANTOPRAZOLE SODIUM 40 MG PO TBEC
40.0000 mg | DELAYED_RELEASE_TABLET | Freq: Every day | ORAL | Status: DC
  Administered 2017-06-06 – 2017-06-08 (×3): 40 mg via ORAL
  Filled 2017-06-06 (×3): qty 1

## 2017-06-06 MED ORDER — ALBUTEROL SULFATE (2.5 MG/3ML) 0.083% IN NEBU
2.5000 mg | INHALATION_SOLUTION | RESPIRATORY_TRACT | Status: DC | PRN
  Administered 2017-06-06: 2.5 mg via RESPIRATORY_TRACT
  Filled 2017-06-06: qty 3

## 2017-06-06 MED ORDER — HYDROCORTISONE 1 % EX CREA: 1.0000 "application " | TOPICAL_CREAM | Freq: Four times a day (QID) | CUTANEOUS | Status: DC | PRN

## 2017-06-06 MED ORDER — FOLIC ACID 1 MG PO TABS
1.0000 mg | ORAL_TABLET | Freq: Every day | ORAL | Status: DC
  Administered 2017-06-06 – 2017-06-08 (×3): 1 mg via ORAL
  Filled 2017-06-06 (×3): qty 1

## 2017-06-06 MED ORDER — IPRATROPIUM BROMIDE 0.02 % IN SOLN
0.5000 mg | Freq: Three times a day (TID) | RESPIRATORY_TRACT | Status: DC
  Administered 2017-06-06 – 2017-06-08 (×7): 0.5 mg via RESPIRATORY_TRACT
  Filled 2017-06-06 (×8): qty 2.5

## 2017-06-06 MED ORDER — DEXTROSE 5 % IV SOLN
2.0000 g | INTRAVENOUS | Status: DC
  Administered 2017-06-06 – 2017-06-07 (×2): 2 g via INTRAVENOUS
  Filled 2017-06-06 (×2): qty 2

## 2017-06-06 NOTE — Progress Notes (Signed)
[name] [id number] Admission Data: 06/06/2017 8:01 PM Attending Provider: Roxan Hockey, MD  OTL:[id number], Mahima, MD Consults/ Treatment Team:   [name] is a 83 y.o. female patient admitted from ED awake, alert  & orientated  X 3,  Full Code, VSS - Blood pressure (!) 124/58, pulse (!) 116, temperature 98.7 F (37.1 C), temperature source Oral, resp. rate 18, height 5\' 1"  (1.549 m), weight 61.3 kg (135 lb 2.3 oz), SpO2 95 %.,  no c/o shortness of breath, no c/o chest pain, no distress noted. Tele # 22 placed and pt is currently running:normal sinus rhythm.   IV site WDL:  forearm right, condition patent and no redness with a transparent dsg that's clean dry and intact.  Allergies:   Allergies  Allergen Reactions  . Adhesive [Tape] Rash    Caused a rash on lower extremities      Past Medical History:  Diagnosis Date  . Acute respiratory failure with hypoxia ([place])   . Anemia   . Aspergillosis ([place])   . Bladder incontinence   . Blindness of one eye   . Cancer ([place])    melanoma   ([place]mo)  . Chronic sinusitis   . Clotting disorder ([place])   . Difficult intubation    Difficult airway with intubation for ARF 07/22/14 (due to anterior larynx, also had mucous plug)  . Fatigue 12/09/2015  . Glaucoma   . Hearing loss in left ear    hearing aid both ears  . History of pulmonary embolism 1997  . Interstitial emphysema ([place])   . Left shoulder pain 12/09/2015  . Multiple allergies   . Neuropathy   . OA (osteoarthritis)   . Pneumonia 12/15  . Shortness of breath dyspnea   . Stroke [facility])    ??? blind left eye  . Wegener's granulomatosis ([place])     History:  obtained from chart review. Tobacco/alcohol: denied none  Pt orientation to unit, room and routine. Information packet given to patient/family and safety video watched.  Admission INP armband ID verified with patient/family, and in place. SR up x 2, fall risk assessment complete with Patient and family verbalizing  understanding of risks associated with falls. Pt verbalizes an understanding of how to use the call bell and to call for help before getting out of bed.  Skin, clean-dry- intact without evidence of bruising, or skin tears.   No evidence of skin break down noted on exam. Generalized bruising on bilateral upper extremities    Will cont to monitor and assist as needed.  Salley Slaughter, RN 06/06/2017 8:01 PM

## 2017-06-06 NOTE — ED Notes (Signed)
Upon ready for discharge pt heart rate maintaining at 115. Per Dorothea Ogle PA to hold pt at this time for CT chest study

## 2017-06-06 NOTE — ED Notes (Signed)
Admitting MD paged to [facility] @ [phone] to [phone].

## 2017-06-06 NOTE — Progress Notes (Signed)
5 [facility] Discharge Planning  1. Do you have someone to take care of you when you get home?  Lives at [facility]  2. Do you have transportation arranged for discharge?  [facility]  3. Will a family member or friend be able to come pick you up at discharge?  [facility]AR  4. Is there a family member/friend who we can call on discharge that can listen in on the discharge, so they can help you follow your treatment plan when you get home?  Daughter Katharine Look  5. Do you need any help getting your medications?  No  6. Do you need any hospital equipment at home?  No  7. How yare you getting your meals? Who will prepare them for you?  [facility]  8. Is there any addition information you need about your health condition, medications, or treatment plan?  Should be DNR like I am at [place]. Do you have a good understanding of what you came to the hospital for, and what medical treatment you received?  Yes

## 2017-06-06 NOTE — ED Notes (Signed)
Redrew light green, per lab reports that previous 2 were hemolyzed.

## 2017-06-06 NOTE — Consult Note (Signed)
Name: [name] MRN: [id number] DOB: 1932/11/14    ADMISSION DATE:  06/05/2017 CONSULTATION DATE:  12/5  REFERRING MD :  [facility]   CHIEF COMPLAINT:  HCAP  BRIEF PATIENT DESCRIPTION: 83 year old female with history of PE and DVT currently on warfarin, chronic sinusitis, asthma, Wegener's granulomatosis followed by rheumatology and Dr. Lake Bells as an outpatient.  She was previously on Cytoxan for many years but this was recently discontinued after a hospitalization in November 2018 with pneumonia and sepsis.  She was treated with a 21-day course of azithromycin and doxycycline which was completed about 1 week prior to this admission.  She presented 12/4 from her nursing home with weakness, poor oral intake, new onset A. fib with RVR per EKG at SNF.  In the ER chest x-ray showed improved left pleural effusion but ongoing atelectasis versus infiltrate on the left.  She is being admitted to telemetry by the hospitalists, and pulmonary asked to consult.  SIGNIFICANT EVENTS    STUDIES:  CTA chest 12/4 >>1. No CT evidence of pulmonary embolism. 2. Moderate left pleural effusion with associated partial compressive atelectasis of left lower lobe. Areas of nodularity in the left lower lobe concerning for superimposed infection. Clinical correlation is recommended. 3. Left apical subpleural bulla similar to prior CT. 4. Right infrahilar atelectasis/scarring. 5.  Aortic Atherosclerosis (ICD10-I70.0).   HISTORY OF PRESENT ILLNESS:  83 year old female with history of PE and DVT currently on warfarin, chronic sinusitis, asthma, Wegener's granulomatosis followed by rheumatology and Dr. Lake Bells as an outpatient.  She was previously on Cytoxan for many years but this was recently discontinued after a hospitalization in November 2018 with pneumonia and sepsis.  She was treated with a 21-day course of azithromycin and doxycycline which was completed about 1 week prior to this admission.  She presented 12/4  from her nursing home with weakness, poor oral intake, new onset A. fib with RVR per EKG at SNF.  In the ER chest x-ray showed improved left pleural effusion but ongoing atelectasis versus infiltrate on the left.  She is being admitted to telemetry by the hospitalists, and pulmonary asked to consult.  Currently remains in ER, feeling better.  Patient is severely deaf and all communication is done via writing notes.  She does states she is feeling better.  She wants to eat breakfast. Denies hemoptysis, chest pain, fever.  PAST MEDICAL HISTORY :   has a past medical history of Acute respiratory failure with hypoxia ([place]), Anemia, Aspergillosis ([place]), Bladder incontinence, Blindness of one eye, Cancer ([place]), Chronic sinusitis, Clotting disorder ([place]), Difficult intubation, Fatigue (12/09/2015), Glaucoma, Hearing loss in left ear, History of pulmonary embolism (1997), Interstitial emphysema ([place]), Left shoulder pain (12/09/2015), Multiple allergies, Neuropathy, OA (osteoarthritis), Pneumonia (12/15), Shortness of breath dyspnea, Stroke ([place]), and Wegener's granulomatosis ([place]).  has a past surgical history that includes Tubal ligation; Pubovaginal sling; Cataract extraction; Melanoma excision; Vena cava filter placement (1997); Knee arthroscopy (2006); Inguinal hernia repair (Left, 02/17/2014); Insertion of mesh (N/A, 02/17/2014); Tear duct probing; tympanomastoidectomy (Left, 04/28/2015); tympanomastoidectomy (Left, 04/28/2015); bronchosocpy; and Cochlear implant (Left, 10/27/2015). Prior to Admission medications   Medication Sig Start Date End Date Taking? Authorizing Provider  acetaminophen (TYLENOL) 325 MG tablet Take 650 mg by mouth See admin instructions. 650 mg at bedtime every night and may take an additional 650 mg every 12 hours as needed for pain   Yes [provider]  Calcium Carbonate-Vitamin D3 (CALCIUM 600-D) 600-400 MG-UNIT TABS Take 1 tablet by mouth daily.  Yes [provider]    Cholecalciferol (VITAMIN D-3) 1000 units CAPS Take 1,000 Units by mouth daily.   Yes [provider]  diclofenac sodium (VOLTAREN) 1 % GEL Apply 2 g topically 3 (three) times daily. Apply to left shoulder. 04/10/16  Yes [provider]  Fluticasone-Salmeterol (ADVAIR DISKUS) 500-50 MCG/DOSE AEPB INHALE 1 PUFF EVERY 12 HOURS. Patient taking differently: Inhale 1 puff into the lungs every 12 (twelve) hours.  02/05/15  Yes Juanito Doom, MD  folic acid (FOLVITE) 1 MG tablet Take 1 mg by mouth daily.  10/24/16  Yes [provider]  gabapentin (NEURONTIN) 100 MG capsule Take 100 mg by mouth 3 (three) times daily.   Yes [provider]  guaiFENesin (MUCINEX) 600 MG 12 hr tablet Take 600 mg by mouth every 12 (twelve) hours.    Yes [provider]  guaiFENesin-dextromethorphan (ROBITUSSIN DM) 100-10 MG/5ML syrup Take 5 mLs every 4 (four) hours as needed by mouth for cough. 05/15/17  Yes Hosie Poisson, MD  hydrocortisone cream 1 % Apply 1 application topically 4 (four) times daily as needed for itching.    Yes [provider]  ipratropium (ATROVENT) 0.02 % nebulizer solution Take 2.5 mLs (0.5 mg total) 3 (three) times daily by nebulization. 05/15/17  Yes Hosie Poisson, MD  latanoprost (XALATAN) 0.005 % ophthalmic solution Place 1 drop into both eyes at bedtime.    Yes [provider]  levalbuterol (XOPENEX) 0.63 MG/3ML nebulizer solution Take 0.63 mg by nebulization every 6 (six) hours as needed for wheezing or shortness of breath.   Yes [provider]  LORazepam (ATIVAN) 0.5 MG tablet Take 1 tablet (0.5 mg total) by mouth at bedtime. 03/20/17  Yes Blanchie Serve, MD  naphazoline-pheniramine (NAPHCON-A) 0.025-0.3 % ophthalmic solution Place 1 drop 3 (three) times daily into the left eye.    Yes [provider]  omeprazole (PRILOSEC) 20 MG capsule Take 20 mg by mouth daily.   Yes [provider]  oxybutynin (DITROPAN) 5  MG tablet One twice daily to help bladder control Patient taking differently: Take 5 mg by mouth 2 (two) times daily.  10/26/16  Yes Estill Dooms, MD  phenol (SORE THROAT SPRAY) 1.4 % LIQD Use as directed 1 spray every 8 (eight) hours as needed in the mouth or throat for throat irritation / pain.    Yes [provider]  potassium chloride SA (K-DUR,KLOR-CON) 20 MEQ tablet Take 40 mEq by mouth 2 (two) times daily.   Yes [provider]  saccharomyces boulardii (FLORASTOR) 250 MG capsule Take 250 mg by mouth daily.    Yes [provider]  sulfamethoxazole-trimethoprim (BACTRIM DS,SEPTRA DS) 800-160 MG tablet Take 1 tablet by mouth every Monday, Wednesday, and Friday.    Yes [provider]  traZODone (DESYREL) 50 MG tablet Take 25 mg by mouth at bedtime as needed for sleep (STOP DATE: 06/15/17).    Yes [provider]  cholecalciferol (VITAMIN D) 1000 UNITS tablet One daily for vitamin D supplement Patient not taking: Reported on 06/05/2017 08/10/14   Estill Dooms, MD  warfarin (COUMADIN) 3 MG tablet Take 3 mg by mouth daily.    [provider]   Allergies  Allergen Reactions  . Adhesive [Tape] Rash    Caused a rash on lower extremities     FAMILY HISTORY:  family history includes Alzheimer's disease in her sister; CVA in her father; Congestive Heart Failure in her mother; Dementia in her sister; Heart  disease in her father and mother; Lymphoma in her sister; Osteoarthritis in her sister; Skin cancer in her sister; Uterine cancer in her sister. SOCIAL HISTORY:  reports that she quit smoking about 32 years ago. Her smoking use included cigarettes. She has a 45.00 pack-year smoking history. she has never used smokeless tobacco. She reports that she does not drink alcohol or use drugs.  REVIEW OF SYSTEMS:   As per HPI - All other systems reviewed and were neg.    SUBJECTIVE: no distress  VITAL SIGNS: Temp:  [97.6 F (36.4 C)] 97.6 F  (36.4 C) (12/04 1847) Pulse Rate:  [93-123] 103 (12/05 0915) Resp:  [15-30] 20 (12/05 0915) BP: (105-143)/(47-81) 143/76 (12/05 0915) SpO2:  [91 %-100 %] 94 % (12/05 0915) Weight:  [59 kg (130 lb)] 59 kg (130 lb) (12/04 1854)  PHYSICAL EXAMINATION: General: Present, chronically ill-appearing female, no acute distress in ER  Neuro:  awake alert, appropriate, severely hard of hearing, must write notes to communicate HEENT: Mucous membranes moist, no JVD Cardiovascular: S1-S2 regular rate and rhythm no A. fib noted on monitor  Lungs: Respirations are even and nonlabored, diminished bases left greater than right, few scattered rhonchi, no audible wheeze Abdomen: Round, soft, nontender  Musculoskeletal: Warm and dry, 1+ BLE edema, venous stasis   Recent Labs  Lab 06/04/17 06/05/17 1026 06/05/17 1850  NA 140 143 137  K 4.1 4.5 4.2  CL  --  107 107  CO2  --  25 23  BUN 8 7 5*  CREATININE 1.0 1.0 0.91  GLUCOSE  --  136* 119*   Recent Labs  Lab 06/05/17 1026 06/05/17 1850  HGB 10.3* 10.4*  HCT 31.3* 32.6*  WBC 6.4 7.4  PLT 296 317   Dg Chest 2 View  Result Date: 06/05/2017 CLINICAL DATA:  Shortness of breath. EXAM: CHEST  2 VIEW COMPARISON:  Radiographs of May 14, 2017. FINDINGS: Stable cardiomediastinal silhouette. Atherosclerosis of thoracic aorta is noted. No pneumothorax is noted. Right lung is clear. Mild left pleural effusion is noted with adjacent atelectasis or infiltrate. This is slightly improved compared to prior exam. Bony thorax unremarkable. IMPRESSION: Aortic atherosclerosis. Slightly improved left pleural effusion with adjacent atelectasis or infiltrate. Electronically Signed   By: Marijo Conception, M.D.   On: 06/05/2017 21:53   Ct Angio Chest Pe W/cm &/or Wo Cm  Result Date: 06/06/2017 CLINICAL DATA:  83 year old female with tachycardia and hypoxia. History of PE and DVT. EXAM: CT ANGIOGRAPHY CHEST WITH CONTRAST TECHNIQUE: Multidetector CT imaging of the chest  was performed using the standard protocol during bolus administration of intravenous contrast. Multiplanar CT image reconstructions and MIPs were obtained to evaluate the vascular anatomy. CONTRAST:  148mL ISOVUE-370 IOPAMIDOL (ISOVUE-370) INJECTION 76% COMPARISON:  Chest radiograph dated 06/05/2017 and CT dated 10/16/2016 FINDINGS: Cardiovascular: There is no cardiomegaly or pericardial effusion. There is moderate atherosclerotic calcification of the thoracic aorta. No aneurysmal dilatation or evidence of dissection. The origins of the great vessels of the aortic arch appear patent. There is no CT evidence of pulmonary embolism. Mediastinum/Nodes: No hilar adenopathy. Esophagus is grossly unremarkable as visualized. No mediastinal fluid collection. Lungs/Pleura: Left apical subpleural bulla appears similar to prior CT. There is a moderate-sized left pleural effusion, new since the prior CT. There is consolidative changes of the left lower lobe with areas of nodularity. This may be related to compressive atelectasis but concerning for superimposed infection. Clinical correlation is recommended. Right infrahilar hazy density may be combination of atelectasis  and scarring. Areas of nodular scarring is seen in the right middle lobe. There is no pneumothorax. The central airways are patent. Upper Abdomen: No acute abnormality. Musculoskeletal: Degenerative changes of the spine. No acute osseous pathology. Review of the MIP images confirms the above findings. IMPRESSION: 1. No CT evidence of pulmonary embolism. 2. Moderate left pleural effusion with associated partial compressive atelectasis of left lower lobe. Areas of nodularity in the left lower lobe concerning for superimposed infection. Clinical correlation is recommended. 3. Left apical subpleural bulla similar to prior CT. 4. Right infrahilar atelectasis/scarring. 5.  Aortic Atherosclerosis (ICD10-I70.0). Electronically Signed   By: Anner Crete M.D.   On:  06/06/2017 05:17    ASSESSMENT / PLAN:  ?Recurrent HCAP-recent hospitalization for pneumonia with sepsis status post 21-day course of azithromycin doxycycline.  Previous pneumonia was MRSA Left pleural effusion-improved since previous hospitalization Wegener's granulomatosis-previously on Cytoxan for many years, now off related to previous pneumonia with sepsis.  Most recent rheumatology note suggests transitioning to methotrexate for maintenance but this is not listed on her home med list. Asthma -no acute flare A. fib with RVR -per report from SNF but not seen here.  Likely related to pneumonia plus minus dehydration Chronic DVT/history PE-on warfarin   Rec- Broad-spectrum antibiotics per primary Sputum culture, urine strep and Legionella all pending Would check swallow eval Continue to hold immunosuppressants-unclear if she is supposed to be on methotrexate at this time Follow-up chest x-ray Pulmonary hygiene Continue anticoagulation for chronic DVT/PE Continue home Advair, Atrovent, PRN BD Consider diagnostic thoracentesis if no improvement-hold off at this time given radiographic improvement in effusion Admit to telemetry per [facility]    Nickolas Madrid, NP 06/06/2017  9:38 AM Pager: [phone] or [phone]  STAFF NOTE: I, Merrie Roof, MD FACP have personally reviewed patient's available data, including medical history, events of note, physical examination and test results as part of my evaluation. I have discussed with resident/NP and other care providers such as pharmacist, RN and RRT. In addition, I personally evaluated patient and elicited key findings of: awake, hard of hearing, no distress at all, jvd wnl, ronchi upper airway mild, distant BS lungs, obese mild, non tender, min edema, pcxr I reviewed with left effusion which is IMPROVED compared to last scan Ct I reviewed, she has normal WBC, no fevers, CT done today chest I reviewed as above does not show  significant infiltrate, she has h/o wegener's and is having no hemoptysis or worsening hypoxia, she is ON room air, she dos have some uppe airway sounds which concerns me about aspiration - would assess SLP, I do not believe this is a flare of wegern's and do not recommend steroids, seems they have been heading toward methotrexate in future as outpt, would assess pcxr in am , have PCt assessment and treat NEW atrial fibrillation which likely accounts for her symptoms  More then any pulmonary primary concern, with new fib , at risk pulm edema, monitor volume status, I will attempt to get number of son in law ped pulm at [place]. Titus Mould, MD, [place] Pgr: [facility] 06/06/2017 12:19 PM

## 2017-06-06 NOTE — Progress Notes (Signed)
ANTICOAGULATION CONSULT NOTE - Initial Consult  Pharmacy Consult for Coumadin Indication: new Afib and h/o VTE  Allergies  Allergen Reactions  . Adhesive [Tape] Rash    Caused a rash on lower extremities     Patient Measurements: Height: 5\' 1"  (154.9 cm) Weight: 130 lb (59 kg) IBW/kg (Calculated) : 47.8  Vital Signs: BP: 105/75 (12/05 0545) Pulse Rate: 113 (12/05 0545)  Labs: Recent Labs    06/04/17 06/05/17 1026 06/05/17 1850 06/05/17 2013  HGB  --  10.3* 10.4*  --   HCT  --  31.3* 32.6*  --   PLT  --  296 317  --   LABPROT  --   --   --  36.7*  INR 3.9*  --   --  3.74  CREATININE 1.0 1.0 0.91  --     Estimated Creatinine Clearance: 39.4 mL/min (by C-G formula based on SCr of 0.91 mg/dL).   Medical History: Past Medical History:  Diagnosis Date  . Acute respiratory failure with hypoxia ([place])   . Anemia   . Aspergillosis ([place])   . Bladder incontinence   . Blindness of one eye   . Cancer ([place])    melanoma   (chemo)  . Chronic sinusitis   . Clotting disorder ([place])   . Difficult intubation    Difficult airway with intubation for ARF 07/22/14 (due to anterior larynx, also had mucous plug)  . Fatigue 12/09/2015  . Glaucoma   . Hearing loss in left ear    hearing aid both ears  . History of pulmonary embolism 1997  . Interstitial emphysema ([place])   . Left shoulder pain 12/09/2015  . Multiple allergies   . Neuropathy   . OA (osteoarthritis)   . Pneumonia 12/15  . Shortness of breath dyspnea   . Stroke [facility])    ??? blind left eye  . Wegener's granulomatosis [facility])     Assessment: 83yo female sent to ED by MD office for abnormal EKG, found to be in Afib w/ RVR, already on Coumadin for h/o VTE and now w/ new indication of Afib; current INR above goal w/ last dose taken 12/3.  Goal of Therapy:  INR 2-3   Plan:  Will hold Coumadin for now and monitor INR for dose adjustments.  Wynona Neat, PharmD, BCPS  06/06/2017,7:10 AM

## 2017-06-06 NOTE — H&P (Signed)
History and Physical    [name] ATF:[id number] DOB: 12-17-1932 DOA: 06/05/2017   PCP: Blanchie Serve, MD   Patient coming from:  Home    Chief Complaint: Shortness of breath  HPI: [name] is a 83 y.o. female with extensive medical issues listed below including COPD, history of PE and DVT currently anticoagulated on Coumadin , Wegener's granulomatosis on Cytoxan as an outpatient, which has not been restarted by rheumatology, due to the patient's current status.,  Microcytic anemia, peripheral neuropathy, recent hospitalization for sepsis secondary to pneumonia on 05/11/2017, finishing a 21-day course of antibiotics about 1-1/2 weeks ago, presenting to the ED from her nursing home, with increased weakness over the past several days since leaving the hospital with pneumonia, poor oral intake, and a new onset of atrial fibrillation with RVR per EKG at the SNF.  Per chart report, son-in-law reported that the patient has been in and out of high heart rates over the past 2-3 weeks, which had also been reported by the nursing staff.  Patient is severely deaf, and her information is obtained by writing small phrases and questions.  She denies any chest pain palpitations, shortness of breath or worsening cough, lightheadedness or dizziness.  She denies a prior history of atrial fibrillation.  Denies any fever, or chills.  She has intermittent headaches due to congestion.  She denies any vision changes, neck pain.  Denies any abdominal pain, dysuria, gross hematuria, or changes in bowel habits.  No bleeding issues are reported.   ED Course:  BP 105/75   Pulse (!) 113   Temp 97.6 F (36.4 C) (Oral)   Resp (!) 21   Ht 5\' 1"  (1.549 m)   Wt 59 kg (130 lb)   SpO2 100%   BMI 24.56 kg/m   Chest x-ray showing improved left pleural effusion, adjacent atelectasis versus infiltrate Received 2, 500 cc of bolus of fluid, and after cultures taken, received vancomycin and Maxipime per pharmacy. Blood,  sputum, cultures are pending.  Prior urine during this admission was negative for Legionella, and blood cultures during that admission, as well as sputum culture showed moderate MRSA, sensitive to tetracycline.  At the time, she had been discharged on Zithromax and doxycycline, which she completed around 05/21/2017 Home nebulizer treatments are resumed, with albuterol, Atrovent, as well as cough medicine, and Mucinex.   At the ED, she had sinus tachycardia Ventricular premature complex, no A. fib was noted. Last 2D echo was 2 years ago, showing EF 55-60%.  No history of CHF. Of note, right lower extremity ultrasound during prior admission showed chronic thrombus in the mid femoral vein.  Review of Systems: As per HPI otherwise 10 point review of systems negative.   Past Medical History:  Diagnosis Date  . Acute respiratory failure with hypoxia ([place])   . Anemia   . Aspergillosis ([place])   . Bladder incontinence   . Blindness of one eye   . Cancer ([place])    melanoma   ([place])  . Chronic sinusitis   . Clotting disorder ([place])   . Difficult intubation    Difficult airway with intubation for ARF 07/22/14 (due to anterior larynx, also had mucous plug)  . Fatigue 12/09/2015  . Glaucoma   . Hearing loss in left ear    hearing aid both ears  . History of pulmonary embolism 1997  . Interstitial emphysema ([place])   . Left shoulder pain 12/09/2015  . Multiple allergies   . Neuropathy   .  OA (osteoarthritis)   . Pneumonia 12/15  . Shortness of breath dyspnea   . Stroke [facility])    ??? blind left eye  . Wegener's granulomatosis ([place])     Past Surgical History:  Procedure Laterality Date  . bronchosocpy     pt. states she has had over 10 in last 20 yrs.  Marland Kitchen CATARACT EXTRACTION    . COCHLEAR IMPLANT Left 10/27/2015   Procedure: LEFT COCHLEAR IMPLANT;  Surgeon: Leta Baptist, MD;  Location: [place];  Service: ENT;  Laterality: Left;  . INGUINAL HERNIA REPAIR Left 02/17/2014   Procedure: LEFT INGUINAL HERNIA  REPAIR WITH MESH;  Surgeon: Harl Bowie, MD;  Location: [place];  Service: General;  Laterality: Left;  . INSERTION OF MESH N/A 02/17/2014   Procedure: INSERTION OF MESH;  Surgeon: Harl Bowie, MD;  Location: [place];  Service: General;  Laterality: N/A;  . KNEE ARTHROSCOPY  2006   rt   . MELANOMA EXCISION     left leg  . PUBOVAGINAL SLING    . TEAR DUCT PROBING    . TUBAL LIGATION    . TYMPANOMASTOIDECTOMY Left 04/28/2015  . TYMPANOMASTOIDECTOMY Left 04/28/2015   Procedure: LEFT TYMPANOMASTOIDECTOMY;  Surgeon: Leta Baptist, MD;  Location: [place];  Service: ENT;  Laterality: Left;  Marland Kitchen VENA CAVA FILTER PLACEMENT  1997   [place] filter    Social History Social History   Socioeconomic History  . Marital status: Married    Spouse name: Not on file  . Number of children: Not on file  . Years of education: Not on file  . Highest education level: Not on file  Social Needs  . Financial resource strain: Not on file  . Food insecurity - worry: Not on file  . Food insecurity - inability: Not on file  . Transportation needs - medical: Not on file  . Transportation needs - non-medical: Not on file  Occupational History  . Occupation: retired Games developer: RETIRED  Tobacco Use  . Smoking status: Former Smoker    Packs/day: 1.50    Years: 30.00    Pack years: 45.00    Types: Cigarettes    Last attempt to quit: 07/03/1984    Years since quitting: 32.9  . Smokeless tobacco: Never Used  . Tobacco comment: quit smoking 30 years ago  Substance and Sexual Activity  . Alcohol use: No    Alcohol/week: 0.0 oz  . Drug use: No  . Sexual activity: No  Other Topics Concern  . Not on file  Social History Narrative   [facility] at [facility] moved to [place] 05/25/15   [place] dementia, died 11-28-2016   Former smoker stopped 1986   Alcohol occasional wine   Exercise PT 3 x a week   Power wheelchair   Has POA, Living Will      Allergies  Allergen Reactions  . Adhesive [Tape] Rash    Caused a rash on lower extremities     Family History  Problem Relation Age of Onset  . Heart disease Father   . CVA Father   . Congestive Heart Failure Mother   . Heart disease Mother   . Lymphoma Sister   . Dementia Sister   . Alzheimer's disease Sister   . Uterine cancer Sister   . Skin cancer Sister   . Osteoarthritis Sister       Prior to Admission medications   Medication Sig Start Date  End Date Taking? Authorizing Provider  acetaminophen (TYLENOL) 325 MG tablet Take 650 mg by mouth See admin instructions. 650 mg at bedtime every night and may take an additional 650 mg every 12 hours as needed for pain   Yes [provider]  Calcium Carbonate-Vitamin D3 (CALCIUM 600-D) 600-400 MG-UNIT TABS Take 1 tablet by mouth daily.   Yes [provider]  Cholecalciferol (VITAMIN D-3) 1000 units CAPS Take 1,000 Units by mouth daily.   Yes [provider]  diclofenac sodium (VOLTAREN) 1 % GEL Apply 2 g topically 3 (three) times daily. Apply to left shoulder. 04/10/16  Yes [provider]  Fluticasone-Salmeterol (ADVAIR DISKUS) 500-50 MCG/DOSE AEPB INHALE 1 PUFF EVERY 12 HOURS. Patient taking differently: Inhale 1 puff into the lungs every 12 (twelve) hours.  02/05/15  Yes Juanito Doom, MD  folic acid (FOLVITE) 1 MG tablet Take 1 mg by mouth daily.  10/24/16  Yes [provider]  gabapentin (NEURONTIN) 100 MG capsule Take 100 mg by mouth 3 (three) times daily.   Yes [provider]  guaiFENesin (MUCINEX) 600 MG 12 hr tablet Take 600 mg by mouth every 12 (twelve) hours.    Yes [provider]  guaiFENesin-dextromethorphan (ROBITUSSIN DM) 100-10 MG/5ML syrup Take 5 mLs every 4 (four) hours as needed by mouth for cough. 05/15/17  Yes Hosie Poisson, MD  hydrocortisone cream 1 % Apply 1 application topically 4 (four) times daily as needed for itching.    Yes [provider]  ipratropium (ATROVENT) 0.02 % nebulizer solution Take 2.5 mLs (0.5 mg total) 3 (three) times daily by nebulization. 05/15/17  Yes Hosie Poisson, MD  latanoprost (XALATAN) 0.005 % ophthalmic solution Place 1 drop into both eyes at bedtime.    Yes [provider]  levalbuterol (XOPENEX) 0.63 MG/3ML nebulizer solution Take 0.63 mg by nebulization every 6 (six) hours as needed for wheezing or shortness of breath.   Yes [provider]  LORazepam (ATIVAN) 0.5 MG tablet Take 1 tablet (0.5 mg total) by mouth at bedtime. 03/20/17  Yes Blanchie Serve, MD  naphazoline-pheniramine (NAPHCON-A) 0.025-0.3 % ophthalmic solution Place 1 drop 3 (three) times daily into the left eye.    Yes [provider]  omeprazole (PRILOSEC) 20 MG capsule Take 20 mg by mouth daily.   Yes [provider]  oxybutynin (DITROPAN) 5 MG tablet One twice daily to help bladder control Patient taking differently: Take 5 mg by mouth 2 (two) times daily.  10/26/16  Yes Estill Dooms, MD  phenol (SORE THROAT SPRAY) 1.4 % LIQD Use as directed 1 spray every 8 (eight) hours as needed in the mouth or throat for throat irritation / pain.    Yes [provider]  potassium chloride SA (K-DUR,KLOR-CON) 20 MEQ tablet Take 40 mEq by mouth 2 (two) times daily.   Yes [provider]  saccharomyces boulardii (FLORASTOR) 250 MG capsule Take 250 mg by mouth daily.    Yes [provider]  sulfamethoxazole-trimethoprim (BACTRIM DS,SEPTRA DS) 800-160 MG tablet Take 1 tablet by mouth every Monday, Wednesday, and Friday.    Yes [provider]  traZODone (DESYREL) 50 MG tablet Take 25 mg by mouth at bedtime as needed for sleep (STOP DATE: 06/15/17).    Yes [provider]  cholecalciferol (VITAMIN D) 1000 UNITS tablet One daily for vitamin D supplement Patient not taking: Reported on 06/05/2017 08/10/14   Estill Dooms, MD  warfarin (COUMADIN) 3 MG tablet Take 3  mg by  mouth daily.    [provider]    Physical Exam:  Vitals:   06/06/17 0400 06/06/17 0445 06/06/17 0500 06/06/17 0545  BP: (!) 121/50 119/63 127/60 105/75  Pulse: (!) 115 (!) 117 (!) 115 (!) 113  Resp: (!) 23 20 (!) 26 (!) 21  Temp:      TempSrc:      SpO2: 97% 98% 96% 100%  Weight:      Height:       Constitutional: NAD, anxious, the patient is severely deaf, communication is via writing. Eyes: PERRL, lids and conjunctivae normal ENMT: Mucous membranes are dry, without exudate or lesions  Neck: normal, supple, no masses, no thyromegaly Respiratory: Somewhat decreased left lower lobe breath sounds, mild expiratory wheezing which is chronic, no crackles. Normal respiratory effort, no accessory muscle use. Cardiovascular: Regular rate and rhythm, no murmurs, rubs or gallops.  Chronic mild right lower extremity edema. 2+ pedal pulses. No carotid bruits.  Abdomen: Soft, non tender, No hepatosplenomegaly. Bowel sounds positive.  Musculoskeletal: no clubbing / cyanosis. Moves all extremities Skin: no jaundice, No lesions.  Neurologic: Sensation intact  Strength equal in all extremities Psychiatric:   Alert and oriented x 3.  Anxious mood.     Labs on Admission: I have personally reviewed following labs and imaging studies  CBC: Recent Labs  Lab 06/05/17 1026 06/05/17 1850  WBC 6.4 7.4  NEUTROABS 5.0 5.7  HGB 10.3* 10.4*  HCT 31.3* 32.6*  MCV 113* 109.0*  PLT 296 427    Basic Metabolic Panel: Recent Labs  Lab 06/04/17 06/05/17 1026 06/05/17 1850  NA 140 143 137  K 4.1 4.5 4.2  CL  --  107 107  CO2  --  25 23  GLUCOSE  --  136* 119*  BUN 8 7 5*  CREATININE 1.0 1.0 0.91  CALCIUM  --  9.6 9.7    GFR: Estimated Creatinine Clearance: 39.4 mL/min (by C-G formula based on SCr of 0.91 mg/dL).  Liver Function Tests: Recent Labs  Lab 06/05/17 1026 06/05/17 1850  AST 41* 39  ALT 30 23  ALKPHOS 128* 129*  BILITOT 0.50 0.5  PROT 6.1* 6.0*  ALBUMIN 2.8*  3.0*   No results for input(s): LIPASE, AMYLASE in the last 168 hours. No results for input(s): AMMONIA in the last 168 hours.  Coagulation Profile: Recent Labs  Lab 06/04/17 06/05/17 2013  INR 3.9* 3.74  PROTIME 41.1*  --     Cardiac Enzymes: No results for input(s): CKTOTAL, CKMB, CKMBINDEX, TROPONINI in the last 168 hours.  BNP (last 3 results) No results for input(s): PROBNP in the last 8760 hours.  HbA1C: No results for input(s): HGBA1C in the last 72 hours.  CBG: No results for input(s): GLUCAP in the last 168 hours.  Lipid Profile: No results for input(s): CHOL, HDL, LDLCALC, TRIG, CHOLHDL, LDLDIRECT in the last 72 hours.  Thyroid Function Tests: Recent Labs    06/06/17 0305  TSH 1.160  FREET4 0.75    Anemia Panel: Recent Labs    06/05/17 1026  FERRITIN 604*  TIBC 146*  IRON 67    Urine analysis:    Component Value Date/Time   COLORURINE YELLOW 06/06/2017 0031   APPEARANCEUR CLEAR 06/06/2017 0031   LABSPEC 1.009 06/06/2017 0031   PHURINE 5.0 06/06/2017 0031   GLUCOSEU NEGATIVE 06/06/2017 0031   HGBUR SMALL (A) 06/06/2017 0031   BILIRUBINUR NEGATIVE 06/06/2017 0031   BILIRUBINUR neg 11/17/2014 1744   KETONESUR NEGATIVE  06/06/2017 0031   PROTEINUR NEGATIVE 06/06/2017 0031   UROBILINOGEN 0.2 11/17/2014 1744   UROBILINOGEN 0.2 07/20/2014 1340   NITRITE NEGATIVE 06/06/2017 0031   LEUKOCYTESUR NEGATIVE 06/06/2017 0031    Sepsis Labs: @LABRCNTIP (procalcitonin:4,lacticidven:4) )No results found for this or any previous visit (from the past 240 hour(s)).   Radiological Exams on Admission: Dg Chest 2 View  Result Date: 06/05/2017 CLINICAL DATA:  Shortness of breath. EXAM: CHEST  2 VIEW COMPARISON:  Radiographs of May 14, 2017. FINDINGS: Stable cardiomediastinal silhouette. Atherosclerosis of thoracic aorta is noted. No pneumothorax is noted. Right lung is clear. Mild left pleural effusion is noted with adjacent atelectasis or infiltrate. This  is slightly improved compared to prior exam. Bony thorax unremarkable. IMPRESSION: Aortic atherosclerosis. Slightly improved left pleural effusion with adjacent atelectasis or infiltrate. Electronically Signed   By: Marijo Conception, M.D.   On: 06/05/2017 21:53   Ct Angio Chest Pe W/cm &/or Wo Cm  Result Date: 06/06/2017 CLINICAL DATA:  83 year old female with tachycardia and hypoxia. History of PE and DVT. EXAM: CT ANGIOGRAPHY CHEST WITH CONTRAST TECHNIQUE: Multidetector CT imaging of the chest was performed using the standard protocol during bolus administration of intravenous contrast. Multiplanar CT image reconstructions and MIPs were obtained to evaluate the vascular anatomy. CONTRAST:  122mL ISOVUE-370 IOPAMIDOL (ISOVUE-370) INJECTION 76% COMPARISON:  Chest radiograph dated 06/05/2017 and CT dated 10/16/2016 FINDINGS: Cardiovascular: There is no cardiomegaly or pericardial effusion. There is moderate atherosclerotic calcification of the thoracic aorta. No aneurysmal dilatation or evidence of dissection. The origins of the great vessels of the aortic arch appear patent. There is no CT evidence of pulmonary embolism. Mediastinum/Nodes: No hilar adenopathy. Esophagus is grossly unremarkable as visualized. No mediastinal fluid collection. Lungs/Pleura: Left apical subpleural bulla appears similar to prior CT. There is a moderate-sized left pleural effusion, new since the prior CT. There is consolidative changes of the left lower lobe with areas of nodularity. This may be related to compressive atelectasis but concerning for superimposed infection. Clinical correlation is recommended. Right infrahilar hazy density may be combination of atelectasis and scarring. Areas of nodular scarring is seen in the right middle lobe. There is no pneumothorax. The central airways are patent. Upper Abdomen: No acute abnormality. Musculoskeletal: Degenerative changes of the spine. No acute osseous pathology. Review of the MIP  images confirms the above findings. IMPRESSION: 1. No CT evidence of pulmonary embolism. 2. Moderate left pleural effusion with associated partial compressive atelectasis of left lower lobe. Areas of nodularity in the left lower lobe concerning for superimposed infection. Clinical correlation is recommended. 3. Left apical subpleural bulla similar to prior CT. 4. Right infrahilar atelectasis/scarring. 5.  Aortic Atherosclerosis (ICD10-I70.0). Electronically Signed   By: Anner Crete M.D.   On: 06/06/2017 05:17    EKG: Independently reviewed.  Assessment/Plan Active Problems:   HCAP (healthcare-associated pneumonia)   WEGENERS GRANULOMATOSIS   Sinusitis, chronic   Intrinsic asthma   Anemia of chronic disease   GERD (gastroesophageal reflux disease)   Personal history of PE (pulmonary embolism)   History of aspergillosis   Deaf   Cardiomegaly   Vitamin D deficiency   Pulmonary nodules   Generalized anxiety disorder   Fatigue   Vision loss of left eye   OAB (overactive bladder)  Generalized weakness in the setting of Sinus tachycardia with one episode of possible Atrial Fibrillation with RVR per report  without recurrence in the setting of possible Pneumonia, dehydration as well as anxiety. Current EKG ST in the 100s.  still has  tachypnea. Osats normal in RA WBC normal at 7.8 . Patient on Coumadin for Rx of DVT. CT angio today neg for PE,. Tn neg. No CP or palps Last 2D echo was 2 years ago, showing EF 55-60%.  No history of CHF. Telemetry inpatient IV hydration  Repeat EKG and Tn  as needed BNP  IV antibiotics as below  If patient becomes symptomatic, or if atrial fibrillation returns, will consult cardiology. PT and OT   Possible Pneumonia as suspected  by x-ray and CT angio, which shows left pleural effusion, adjacent atelectasis versus infiltrate  improved from prior hospitalization.  Received 2, 500 cc of bolus of fluid, and after cultures taken, received vancomycin and Maxipime  per pharmacy.Blood, sputum, cultures are pending.  Prior urine during this admission was negative for Legionella, and blood cultures during that admission, as well as sputum culture showed moderate MRSA, sensitive to tetracycline.  At the time, she had been discharged on Zithromax and doxycycline, which she completed around 04/20/2017 Home nebulizer treatments are resumed, with albuterol, Atrovent, as well as cough medicine, and Mucinex.   Oxygen   Follow sputum cultures, blood cultures  IV antibiotics with per protocol with Vanco and Maxipime Procalcitonin,Strep pneumo urine antigen, Legionella urine antigen Nebulizers as needed, with Atrovent q 4 and Albuterol q 4 h prn, continue Breo Ellipta Mucinex prn  IV fluids antipyretics he had an Repeat CBC in am  History of DVT and PE, on chronic Coumadin.  This is on hold due to INR being supratherapeutic.  Recent right lower extremity ultrasound during prior admission showed chronic thrombus in the mid femoral vein.  CT angio the chest negative for PE  continue Coumadin per pharmacy dose Monitor PT and INR   GERD, no acute symptoms Continue PPI  Peripheral neuropathy Continue Neurontin    Wegener's granulomatosis, the patient is on Cytoxan as an outpatient, which has not been restarted by rheumatology, due to the patient's current status.,  Anemia of chronic disease, macrocytic no acute issues.  Hemoglobin 10.9, stable from prior CBC in a.m. Continue folic acid Continue Feraheme (scheduled for 12/9) with Dr. Marin Olp as [facility]   Overactive bladder Continue Ditropan  Insomnia, anxiety Continue Ativan  Left eye blindness Continue eyedrops with Xalatan  DVT prophylaxis: Coumadin  code Status:   Full    Family Communication:  Discussed with patient Disposition Plan: Expect patient to be discharged to home after condition improves Consults called:    None Admission status:Tele   Inpatient    Sharene Butters, PA-C [facility]   06/06/2017, 6:45 AM

## 2017-06-06 NOTE — ED Notes (Signed)
Patient transported to CT 

## 2017-06-06 NOTE — Progress Notes (Signed)
Pharmacy Antibiotic Note  [name] is a 83 y.o. female admitted on 06/05/2017 with pneumonia.  Pharmacy has been consulted for vancomycin and cefepime dosing.  Plan: Vancomycin 1000mg  x1 then 750mg  IV every 24 hours.  Goal trough 15-20 mcg/mL.  Cefepime 2g IV every 24 hours.  Height: 5\' 1"  (154.9 cm) Weight: 130 lb (59 kg) IBW/kg (Calculated) : 47.8  Temp (24hrs), Avg:97.6 F (36.4 C), Min:97.6 F (36.4 C), Max:97.6 F (36.4 C)  Recent Labs  Lab 06/04/17 06/05/17 1026 06/05/17 1850 06/05/17 2321  WBC  --  6.4 7.4  --   CREATININE 1.0 1.0 0.91  --   LATICACIDVEN  --   --   --  <0.30*    Estimated Creatinine Clearance: 39.4 mL/min (by C-G formula based on SCr of 0.91 mg/dL).    Allergies  Allergen Reactions  . Adhesive [Tape] Rash    Caused a rash on lower extremities     Thank you for allowing pharmacy to be a part of this patient's care.  Wynona Neat, PharmD, BCPS  06/06/2017 6:29 AM

## 2017-06-07 ENCOUNTER — Inpatient Hospital Stay (HOSPITAL_COMMUNITY): Payer: Medicare Other

## 2017-06-07 ENCOUNTER — Other Ambulatory Visit: Payer: Self-pay

## 2017-06-07 DIAGNOSIS — H9193 Unspecified hearing loss, bilateral: Secondary | ICD-10-CM

## 2017-06-07 DIAGNOSIS — M3130 Wegener's granulomatosis without renal involvement: Secondary | ICD-10-CM

## 2017-06-07 DIAGNOSIS — F411 Generalized anxiety disorder: Secondary | ICD-10-CM

## 2017-06-07 DIAGNOSIS — Z86711 Personal history of pulmonary embolism: Secondary | ICD-10-CM

## 2017-06-07 LAB — PROTIME-INR
INR: 2.31
Prothrombin Time: 25.2 seconds — ABNORMAL HIGH (ref 11.4–15.2)

## 2017-06-07 LAB — URINE CULTURE: Culture: NO GROWTH

## 2017-06-07 LAB — LEGIONELLA PNEUMOPHILA SEROGP 1 UR AG: L. pneumophila Serogp 1 Ur Ag: NEGATIVE

## 2017-06-07 MED ORDER — WARFARIN SODIUM 3 MG PO TABS
3.0000 mg | ORAL_TABLET | Freq: Once | ORAL | Status: AC
  Administered 2017-06-07: 3 mg via ORAL
  Filled 2017-06-07: qty 1

## 2017-06-07 MED ORDER — METOPROLOL TARTRATE 25 MG PO TABS
25.0000 mg | ORAL_TABLET | Freq: Two times a day (BID) | ORAL | Status: DC
  Administered 2017-06-07 – 2017-06-08 (×3): 25 mg via ORAL
  Filled 2017-06-07 (×3): qty 1

## 2017-06-07 NOTE — Progress Notes (Addendum)
ANTICOAGULATION CONSULT NOTE - Follow Up Consult  Pharmacy Consult for Coumadin Indication: atrial fibrillation and hx DVT and PE  Patient Measurements: Height: 5\' 1"  (154.9 cm) Weight: 134 lb 14.7 oz (61.2 kg) IBW/kg (Calculated) : 47.8  Vital Signs: Temp: 98.6 F (37 C) (12/06 1300) Temp Source: Oral (12/06 1300) BP: 104/45 (12/06 1300) Pulse Rate: 115 (12/06 1300)  Labs: Recent Labs    06/05/17 1026 06/05/17 1850 06/05/17 2013 06/07/17 1115  HGB 10.3* 10.4*  --   --   HCT 31.3* 32.6*  --   --   PLT 296 317  --   --   LABPROT  --   --  36.7* 25.2*  INR  --   --  3.74 2.31  CREATININE 1.0 0.91  --   --     Estimated Creatinine Clearance: 40 mL/min (by C-G formula based on SCr of 0.91 mg/dL).  Assessment:   83 yr old female, continues on Coumadin for hx  DVT and PE and new onset atrial fibrillation.   INR down to 2.31 today.  Last Coumadin dose 12/3; has been on hold due to supratherapeutic INRs.  INR may have been effected by outpatient antibiotic use. INR may decrease further by am.    Thoracentesis considered but holding off for now.    Home Coumadin regimen: 3 mg daily  Goal of Therapy:  INR 2-3 Monitor platelets by anticoagulation protocol: Yes   Plan:   Resume/continue Coumadin with 3 mg x 1 today, usual dose.  Daily PT/INR.  Arty Baumgartner, [place] Pager: [phone] 06/07/2017,3:30 PM
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
## 2017-06-07 NOTE — Evaluation (Signed)
Occupational Therapy Evaluation Patient Details Name: [name] MRN: [id number] DOB: December 04, 1932 Today's Date: 06/07/2017    History of Present Illness 83 y.o. female with extensive medical issues including COPD, history of PE and DVT currently anticoagulated on Coumadin , Wegener's granulomatosis, microcytic anemia, and peripheral neuropathy, recent hospitalization for sepsis secondary to pneumonia on 05/11/2017, presenting to the ED from SNF with generalized weakness and decreased oral intake. She presented 12/4 from her nursing home with weakness, poor oral intake, new onset A. fib with RVR per EKG at SNF.  In the ER chest x-ray showed improved left pleural effusion but ongoing atelectasis versus infiltrate on the left.     Clinical Impression   Pt from SNF where she was independent in ADL and mobility with RW/electric scooter. Pt is VERY HOH and most of communication was done through writing (even with hearing aide in). Pt is currently mod A for transfers, set up for UB ADL and mod A for LB ADL. Please see OT problem list below. Pt will benefit from skilled OT in the acute setting and afterwards at the SNF level to maximize safety and independence in ADL and return to PLOF.    Follow Up Recommendations  SNF    Equipment Recommendations  Other (comment)(defer to next venue)    Recommendations for Other Services       Precautions / Restrictions Precautions Precautions: Fall Restrictions Weight Bearing Restrictions: No      Mobility Bed Mobility Overal bed mobility: Needs Assistance Bed Mobility: Supine to Sit     Supine to sit: Min guard     General bed mobility comments: min guard for safety  Transfers Overall transfer level: Needs assistance Equipment used: 1 person hand held assist Transfers: Stand Pivot Transfers Sit to Stand: Mod assist Stand pivot transfers: Mod assist       General transfer comment: face to face transfer, mod A for power up and steady  assist for pivot steps    Balance Overall balance assessment: Needs assistance Sitting-balance support: Feet supported;Bilateral upper extremity supported Sitting balance-Leahy Scale: Fair Sitting balance - Comments: slight posterior lean when sitting EOB Postural control: Posterior lean Standing balance support: Bilateral upper extremity supported;During functional activity Standing balance-Leahy Scale: Poor Standing balance comment: dependent on external support                           ADL either performed or assessed with clinical judgement   ADL Overall ADL's : Needs assistance/impaired Eating/Feeding: NPO   Grooming: Wash/dry hands;Wash/dry face;Set up;Sitting Grooming Details (indicate cue type and reason): in recliner Upper Body Bathing: Supervision/ safety;Sitting   Lower Body Bathing: Min guard;Sitting/lateral leans   Upper Body Dressing : Set up;Sitting Upper Body Dressing Details (indicate cue type and reason): EOB to switch gown Lower Body Dressing: Moderate assistance;Sit to/from stand   Toilet Transfer: Moderate assistance;Stand-pivot Toilet Transfer Details (indicate cue type and reason): face to face transfer Toileting- Clothing Manipulation and Hygiene: Maximal assistance;Sit to/from stand       Functional mobility during ADLs: Moderate assistance(stand pivot only)       Vision Baseline Vision/History: Wears glasses Wears Glasses: Distance only Patient Visual Report: No change from baseline       Perception     Praxis      Pertinent Vitals/Pain Pain Assessment: No/denies pain Faces Pain Scale: Hurts little more Pain Location: L knee with movement Pain Descriptors / Indicators: Grimacing;Guarding Pain Intervention(s): Repositioned;Monitored during session  Hand Dominance Right   Extremity/Trunk Assessment Upper Extremity Assessment Upper Extremity Assessment: Overall WFL for tasks assessed;Generalized weakness   Lower  Extremity Assessment Lower Extremity Assessment: Defer to PT evaluation   Cervical / Trunk Assessment Cervical / Trunk Assessment: Kyphotic   Communication Communication Communication: HOH(VERY)   Cognition Arousal/Alertness: Awake/alert Behavior During Therapy: WFL for tasks assessed/performed Overall Cognitive Status: Within Functional Limits for tasks assessed                                 General Comments: Pt perseverating on going home tomorrow   General Comments  Co-tx with SLP as she was doing a swallow eval, transfer performed to assist with alertness and upright posture. Pt with urinary incontinence when OT arrived (wet despite purewick) gown changed and Pt cleaned up.     Exercises     Shoulder Instructions      Home Living Family/patient expects to be discharged to:: Assisted living                             Home Equipment: Electric scooter;Cane - single point          Prior Functioning/Environment Level of Independence: Needs assistance  Gait / Transfers Assistance Needed: uses scooter to meals ADL's / Homemaking Assistance Needed: independent in ADLs, goes to dining room for meals            OT Problem List: Decreased strength;Decreased activity tolerance;Impaired balance (sitting and/or standing);Decreased cognition;Decreased safety awareness;Cardiopulmonary status limiting activity      OT Treatment/Interventions: Self-care/ADL training;Therapeutic exercise;Energy conservation;DME and/or AE instruction;Therapeutic activities;Patient/family education;Balance training    OT Goals(Current goals can be found in the care plan section) Acute Rehab OT Goals Patient Stated Goal: go home tomorrow OT Goal Formulation: With patient Time For Goal Achievement: 06/21/17 Potential to Achieve Goals: Fair ADL Goals Pt Will Perform Grooming: with min guard assist;standing Pt Will Transfer to Toilet: with supervision;stand pivot  transfer Pt Will Perform Toileting - Clothing Manipulation and hygiene: with min guard assist;sit to/from stand Additional ADL Goal #1: Pt will recall 3 ways of conserving energy while performing ADL - with 2 or less verbal cues  OT Frequency: Min 2X/week   Barriers to D/C:            Co-evaluation PT/OT/SLP Co-Evaluation/Treatment: Yes Reason for Co-Treatment: For patient/therapist safety;To address functional/ADL transfers   OT goals addressed during session: ADL's and self-care SLP goals addressed during session: Swallowing    AM-PAC PT "6 Clicks" Daily Activity     Outcome Measure Help from another person eating meals?: None(NPO - able to perform independently during swallow eval) Help from another person taking care of personal grooming?: A Little Help from another person toileting, which includes using toliet, bedpan, or urinal?: A Lot Help from another person bathing (including washing, rinsing, drying)?: A Little Help from another person to put on and taking off regular upper body clothing?: A Little Help from another person to put on and taking off regular lower body clothing?: A Lot 6 Click Score: 17   End of Session Equipment Utilized During Treatment: Gait belt Nurse Communication: Mobility status;Other (comment)(no purewick - needs to be replaced  (notified RN and NT))  Activity Tolerance: Patient tolerated treatment well Patient left: in chair;with call bell/phone within reach;with chair alarm set  OT Visit Diagnosis: Unsteadiness on feet (R26.81);Other abnormalities of gait  and mobility (R26.89);Muscle weakness (generalized) (M62.81)                Time: 1435-1510 OT Time Calculation (min): 35 min Charges:  OT General Charges $OT Visit: 1 Visit OT Evaluation $OT Eval Moderate Complexity: 1 Mod G-Codes:     Hulda Humphrey OTR/L [place] 06/07/2017, 3:43 PM

## 2017-06-07 NOTE — Consult Note (Signed)
Name: [name] MRN: [id number] DOB: 11/04/1932    ADMISSION DATE:  06/05/2017 CONSULTATION DATE:  12/5  REFERRING MD :  [facility]   CHIEF COMPLAINT:  HCAP  BRIEF PATIENT DESCRIPTION: 83 year old female with history of PE and DVT currently on warfarin, chronic sinusitis, asthma, Wegener's granulomatosis followed by rheumatology and Dr. [place] Bells as an outpatient.  She was previously on Cytoxan for many years but this was recently discontinued after a hospitalization in November 2018 with pneumonia and sepsis.  She was treated with a 21-day course of azithromycin and doxycycline which was completed about 1 week prior to this admission.  She presented 12/4 from her nursing home with weakness, poor oral intake, new onset A. fib with RVR per EKG at SNF.  In the ER chest x-ray showed improved left pleural effusion but ongoing atelectasis versus infiltrate on the left.  She is being admitted to telemetry by the hospitalists, and pulmonary asked to consult.  SIGNIFICANT EVENTS    STUDIES:  CTA chest 12/4 >>1. No CT evidence of pulmonary embolism. 2. Moderate left pleural effusion with associated partial compressive atelectasis of left lower lobe. Areas of nodularity in the left lower lobe concerning for superimposed infection. Clinical correlation is recommended. 3. Left apical subpleural bulla similar to prior CT. 4. Right infrahilar atelectasis/scarring. 5.  Aortic Atherosclerosis (ICD10-I70.0).  12/5 - Currently remains in ER, feeling better.  Patient is severely deaf and all communication is done via writing notes.  She does states she is feeling better.  She wants to eat breakfast. Denies hemoptysis, chest pain, fever.   SUBJECTIVE/OVERNIGHT/INTERVAL HX 06/07/17 - in bed. Son in law Dr Jerolyn Center at bedside. Feels she is doing fair. ffeels tachycardia improving. Not keen on thora  VITAL SIGNS: Temp:  [98.4 F (36.9 C)-98.7 F (37.1 C)] 98.6 F (37 C) (12/06 1300) Pulse Rate:   [115-133] 115 (12/06 1300) Resp:  [18-22] 18 (12/06 1300) BP: (83-144)/(45-82) 104/45 (12/06 1300) SpO2:  [95 %-100 %] 100 % (12/06 1300) Weight:  [61.2 kg (134 lb 14.7 oz)-61.3 kg (135 lb 2.3 oz)] 61.2 kg (134 lb 14.7 oz) (12/06 0500)  PHYSICAL EXAMINATION:  General Appearance:    Deconditioned in bed  Head:    Normocephalic, without obvious abnormality, atraumatic  Eyes:    PERRL - left eye differnet, conjunctiva/corneas - clear      Ears:    Normal external ear canals, both ears  Nose:   NG tube - no  Throat:  ETT TUBE - no , OG tube - no  Neck:   Supple,  No enlargement/tenderness/nodules     Lungs:     Clear to auscultation bilaterally,   Chest wall:    No deformity  Heart:    S1 and S2 normal, no murmur, CVP - no.  Pressors - no  Abdomen:     Soft, no masses, no organomegaly  Genitalia:    Not done  Rectal:   not done  Extremities:   Extremities- intact     Skin:   Intact in exposed areas .      Neurologic:   Sedation - none -> RASS - -1 . Moves all 4s - yes. CAM-ICU - ? . Orientation - not fully oriented v poor historian        Recent Labs  Lab 06/04/17 06/05/17 1026 06/05/17 1850  NA 140 143 137  K 4.1 4.5 4.2  CL  --  107 107  CO2  --  25 23  BUN 8 7 5*  CREATININE 1.0 1.0 0.91  GLUCOSE  --  136* 119*   Recent Labs  Lab 06/05/17 1026 06/05/17 1850  HGB 10.3* 10.4*  HCT 31.3* 32.6*  WBC 6.4 7.4  PLT 296 317   Dg Chest 2 View  Result Date: 06/07/2017 CLINICAL DATA:  Pneumonia.  Productive cough. EXAM: CHEST  2 VIEW COMPARISON:  CT 06/06/2017.  Chest x-ray 06/05/2017 . FINDINGS: Mediastinum and hilar structures normal. Heart size normal. Left base atelectasis/ infiltrate with left-sided pleural effusion noted. Low lung volumes. Stable left apical bullous changes. No pneumothorax. Thoracic spine scoliosis. IMPRESSION: 1. Left base atelectasis/infiltrate and left-sided pleural effusion again noted. No significant change from prior CT. 2. Stable left apical  bullous changes again noted. Electronically Signed   By: Marcello Moores  Register   On: 06/07/2017 08:21   Dg Chest 2 View  Result Date: 06/05/2017 CLINICAL DATA:  Shortness of breath. EXAM: CHEST  2 VIEW COMPARISON:  Radiographs of May 14, 2017. FINDINGS: Stable cardiomediastinal silhouette. Atherosclerosis of thoracic aorta is noted. No pneumothorax is noted. Right lung is clear. Mild left pleural effusion is noted with adjacent atelectasis or infiltrate. This is slightly improved compared to prior exam. Bony thorax unremarkable. IMPRESSION: Aortic atherosclerosis. Slightly improved left pleural effusion with adjacent atelectasis or infiltrate. Electronically Signed   By: Marijo Conception, M.D.   On: 06/05/2017 21:53   Ct Angio Chest Pe W/cm &/or Wo Cm  Result Date: 06/06/2017 CLINICAL DATA:  83 year old female with tachycardia and hypoxia. History of PE and DVT. EXAM: CT ANGIOGRAPHY CHEST WITH CONTRAST TECHNIQUE: Multidetector CT imaging of the chest was performed using the standard protocol during bolus administration of intravenous contrast. Multiplanar CT image reconstructions and MIPs were obtained to evaluate the vascular anatomy. CONTRAST:  183mL ISOVUE-370 IOPAMIDOL (ISOVUE-370) INJECTION 76% COMPARISON:  Chest radiograph dated 06/05/2017 and CT dated 10/16/2016 FINDINGS: Cardiovascular: There is no cardiomegaly or pericardial effusion. There is moderate atherosclerotic calcification of the thoracic aorta. No aneurysmal dilatation or evidence of dissection. The origins of the great vessels of the aortic arch appear patent. There is no CT evidence of pulmonary embolism. Mediastinum/Nodes: No hilar adenopathy. Esophagus is grossly unremarkable as visualized. No mediastinal fluid collection. Lungs/Pleura: Left apical subpleural bulla appears similar to prior CT. There is a moderate-sized left pleural effusion, new since the prior CT. There is consolidative changes of the left lower lobe with areas of  nodularity. This may be related to compressive atelectasis but concerning for superimposed infection. Clinical correlation is recommended. Right infrahilar hazy density may be combination of atelectasis and scarring. Areas of nodular scarring is seen in the right middle lobe. There is no pneumothorax. The central airways are patent. Upper Abdomen: No acute abnormality. Musculoskeletal: Degenerative changes of the spine. No acute osseous pathology. Review of the MIP images confirms the above findings. IMPRESSION: 1. No CT evidence of pulmonary embolism. 2. Moderate left pleural effusion with associated partial compressive atelectasis of left lower lobe. Areas of nodularity in the left lower lobe concerning for superimposed infection. Clinical correlation is recommended. 3. Left apical subpleural bulla similar to prior CT. 4. Right infrahilar atelectasis/scarring. 5.  Aortic Atherosclerosis (ICD10-I70.0). Electronically Signed   By: Anner Crete M.D.   On: 06/06/2017 05:17    ASSESSMENT / PLAN:    Left pleural effusion - persists. Concdrn for parapneumonic. Thora recommended but son in law discussed with his wife the patient daughter - > wants to hold off  Wegener's granulomatosis-previously on Cytoxan  for many years, now off related to previous pneumonia with sepsis.  Most recent rheumatology note suggests transitioning to methotrexate for maintenance but this is not listed on her home med list.  Asthma -no acute flare  A. fib with RVR -per report from SNF but not seen here.  Likely related to pneumonia plus minus dehydration  Chronic DVT/history PE-on warfarin   Rec- thora - but wants to hol off PT and pulm toilet  pccm will sign off   Future Appointments  Date Time Provider [place]  06/21/2017 11:00 AM [facility] LAB [facility] None  06/21/2017 11:15 AM [place], Holli Humbles, NP [facility] None  07/18/2017  2:00 PM Juanito Doom, MD [facility] None  07/27/2017 11:15 AM Juanito Doom, MD [facility] None     Dr. Brand Males, M.D., [facility]C.P Pulmonary and Critical Care Medicine Staff Physician, [place] Director - Interstitial Lung Disease  Program  Pulmonary [place] [facility] [place], [place], [address]  Pager: [phone], If no answer or between  15:00h - 7:00h: call [phone] Telephone: [phone]

## 2017-06-07 NOTE — Progress Notes (Signed)
PROGRESS NOTE    [name]  FGH:[id number] DOB: [birth year] DOA: 06/05/2017 PCP: Blanchie Serve, MD    Brief Narrative:  83 year old female who presented with dyspnea. Patient does have significant past medical history of COPD, history of PE and DVT, and Wegener's granulomatosis. Patient was transferred from the skilled nursing facility due to worsening weakness for several days, associated with poor oral intake and tachycardia. She was found to be in atrial fibrillation rhythm. On initial physical examination, blood pressure 105/75, heart rate 113, respiratory 21, oxygen saturation 100%. Dry mucous membranes, her lungs had decrease breath sounds bilaterally with positive wheezing, no rhonchi or rales, heart S1-S2 present, irregularly irregular, tachycardic, abdomen soft nontender, positive lower extremity edema. Sodium 137, potassium 4.2, chloride 107, bicarbonate 3, glucose 119, BUN 5, creatinine 0.91. White cell count 7.4, hemoglobin 10.4, hematocrit 32.6, platelets 317, INR 2.31, urinalysis with 6-30 white cells, negative nitrates. Chest x-ray with right rotation, hypoinflation, chest CT with large bullae in the left apical zone, left pleural effusion with associated atelectasis. EKG was sinus rhythm, rate of 96 minute, normal axis, normal intervals.  Patient was admitted to the hospital working diagnosis of left pleural effusion with possible associated pneumonia, rule out underlying tachyarrhythmia.     Assessment & Plan:   Active Problems:   WEGENERS GRANULOMATOSIS   Sinusitis, chronic   Intrinsic asthma   Anemia of chronic disease   GERD (gastroesophageal reflux disease)   Personal history of PE (pulmonary embolism)   History of aspergillosis   Deaf   Cardiomegaly   Vitamin D deficiency   Pulmonary nodules   Generalized anxiety disorder   Fatigue   Vision loss of left eye   OAB (overactive bladder)   HCAP (healthcare-associated pneumonia)   Tachycardia   Pleural  effusion   1.Supreventricular tachycardia. Suspected reactive to pulmonary deconditioning, high pretest probability for MAT, will start patient on low dose metoprolol. EKG personally reviewed noted atrial tachycardia. Will continue telemetry monitoring.  2. Left pleural effusion with associated atelectasis. Patient recently completed course of antibiotic therapy, ct and chest film personally reviewed no frank pneumonic infiltrate, will hold on antibiotic therapy and will continue close monitoring. Discussed with patient's family and will hold on thoracentesis for now. Check swallow evaluation. Continue bronchodilator therapy.   3. Wegner's granulomatosis. Large bullae on left apical lobe, no clinical signs of flare up, will continue follow up as outpatient.   4. History of DVT and PE. Will resume anticoagulation with warfarin, INR at 2,3, will continue warfarin.   DVT prophylaxis: warfrain  Code Status: dnr Family Communication: I spoke with patient's family at the bedside and all questions were addressed. Disposition Plan: snf   Consultants:   Pulmonary  Procedures:     Antimicrobials:       Subjective: Patient feeling well, no dyspnea or palpitations, no chest pain. Has been at SNF recovering from recent pneumonia.   Objective: Vitals:   06/06/17 2355 06/07/17 0500 06/07/17 0630 06/07/17 0748  BP: (!) 130/57  (!) 144/82   Pulse:   (!) 133   Resp:   20   Temp:   98.4 F (36.9 C)   TempSrc:   Oral   SpO2:   98% 98%  Weight:  61.2 kg (134 lb 14.7 oz)    Height:        Intake/Output Summary (Last 24 hours) at 06/07/2017 1322 Last data filed at 06/07/2017 0957 Gross per 24 hour  Intake 370 ml  Output 750 ml  Net -380 ml   Filed Weights   06/05/17 1854 06/06/17 1627 06/07/17 0500  Weight: 59 kg (130 lb) 61.3 kg (135 lb 2.3 oz) 61.2 kg (134 lb 14.7 oz)    Examination:   General: deconditioned Neurology: Awake and alert, non focal  E ENT: mild pallor, no  icterus, oral mucosa moist Cardiovascular: No JVD. S1-S2 present, rhythmic, no gallops, rubs, or murmurs. No lower extremity edema. Pulmonary: decreased breath sounds bilaterally, adequate air movement, no wheezing, scattered rhonchi and rales, bilaterally. Gastrointestinal. Abdomen flat, no organomegaly, non tender, no rebound or guarding Skin. No rashes Musculoskeletal: no joint deformities     Data Reviewed: I have personally reviewed following labs and imaging studies  CBC: Recent Labs  Lab 06/05/17 1026 06/05/17 1850  WBC 6.4 7.4  NEUTROABS 5.0 5.7  HGB 10.3* 10.4*  HCT 31.3* 32.6*  MCV 113* 109.0*  PLT 296 443   Basic Metabolic Panel: Recent Labs  Lab 06/04/17 06/05/17 1026 06/05/17 1850  NA 140 143 137  K 4.1 4.5 4.2  CL  --  107 107  CO2  --  25 23  GLUCOSE  --  136* 119*  BUN 8 7 5*  CREATININE 1.0 1.0 0.91  CALCIUM  --  9.6 9.7   GFR: Estimated Creatinine Clearance: 40 mL/min (by C-G formula based on SCr of 0.91 mg/dL). Liver Function Tests: Recent Labs  Lab 06/05/17 1026 06/05/17 1850  AST 41* 39  ALT 30 23  ALKPHOS 128* 129*  BILITOT 0.50 0.5  PROT 6.1* 6.0*  ALBUMIN 2.8* 3.0*   No results for input(s): LIPASE, AMYLASE in the last 168 hours. No results for input(s): AMMONIA in the last 168 hours. Coagulation Profile: Recent Labs  Lab 06/04/17 06/05/17 2013 06/07/17 1115  INR 3.9* 3.74 2.31  PROTIME 41.1*  --   --    Cardiac Enzymes: No results for input(s): CKTOTAL, CKMB, CKMBINDEX, TROPONINI in the last 168 hours. BNP (last 3 results) No results for input(s): PROBNP in the last 8760 hours. HbA1C: No results for input(s): HGBA1C in the last 72 hours. CBG: No results for input(s): GLUCAP in the last 168 hours. Lipid Profile: No results for input(s): CHOL, HDL, LDLCALC, TRIG, CHOLHDL, LDLDIRECT in the last 72 hours. Thyroid Function Tests: Recent Labs    06/06/17 0305  TSH 1.160  FREET4 0.75   Anemia Panel: Recent Labs     06/05/17 1026  FERRITIN 604*  TIBC 146*  IRON 67      Radiology Studies: I have reviewed all of the imaging during this hospital visit personally     Scheduled Meds: . calcium-vitamin D  1 tablet Oral Q breakfast  . cholecalciferol  1,000 Units Oral Daily  . diclofenac sodium  2 g Topical TID  . fluticasone furoate-vilanterol  1 puff Inhalation Daily  . folic acid  1 mg Oral Daily  . gabapentin  100 mg Oral TID  . guaiFENesin  600 mg Oral Q12H  . ipratropium  0.5 mg Nebulization TID  . latanoprost  1 drop Both Eyes QHS  . LORazepam  0.5 mg Oral QHS  . naphazoline-pheniramine  1 drop Left Eye TID  . oxybutynin  5 mg Oral BID  . pantoprazole  40 mg Oral Daily  . potassium chloride SA  40 mEq Oral BID  . saccharomyces boulardii  250 mg Oral Daily  . traZODone  50 mg Oral QHS  . Warfarin - Pharmacist Dosing Inpatient   Does not apply [phone]  Continuous Infusions: . ceFEPime (MAXIPIME) IV Stopped (06/07/17 0849)  . vancomycin Stopped (06/07/17 5035)     LOS: 1 day        Mauricio Gerome Apley, MD [facility] Pager [phone]

## 2017-06-07 NOTE — Evaluation (Addendum)
Clinical/Bedside Swallow Evaluation Patient Details  Name: [name] MRN: [id number] Date of Birth: Jul 04, 1932  Today's Date: 06/07/2017 Time: SLP Start Time (ACUTE ONLY): 1435 SLP Stop Time (ACUTE ONLY): 1510 SLP Time Calculation (min) (ACUTE ONLY): 35 min  Past Medical History:  Past Medical History:  Diagnosis Date  . Acute respiratory failure with hypoxia ([place])   . Anemia   . Aspergillosis ([place])   . Bladder incontinence   . Blindness of one eye   . Cancer ([place])    melanoma   ([place])  . Chronic sinusitis   . Clotting disorder ([place])   . Difficult intubation    Difficult airway with intubation for ARF 07/22/14 (due to anterior larynx, also had mucous plug)  . Fatigue 12/09/2015  . Glaucoma   . Hearing loss in left ear    hearing aid both ears  . History of pulmonary embolism 1997  . Interstitial emphysema ([place])   . Left shoulder pain 12/09/2015  . Multiple allergies   . Neuropathy   . OA (osteoarthritis)   . Pneumonia 12/15  . Shortness of breath dyspnea   . Stroke [facility])    ??? blind left eye  . Wegener's granulomatosis ([place])    Past Surgical History:  Past Surgical History:  Procedure Laterality Date  . bronchosocpy     pt. states she has had over 10 in last 20 yrs.  Marland Kitchen CATARACT EXTRACTION    . COCHLEAR IMPLANT Left 10/27/2015   Procedure: LEFT COCHLEAR IMPLANT;  Surgeon: Leta Baptist, MD;  Location: [place];  Service: ENT;  Laterality: Left;  . INGUINAL HERNIA REPAIR Left 02/17/2014   Procedure: LEFT INGUINAL HERNIA REPAIR WITH MESH;  Surgeon: Harl Bowie, MD;  Location: [place];  Service: General;  Laterality: Left;  . INSERTION OF MESH N/A 02/17/2014   Procedure: INSERTION OF MESH;  Surgeon: Harl Bowie, MD;  Location: [place];  Service: General;  Laterality: N/A;  . KNEE ARTHROSCOPY  2006   rt   . MELANOMA EXCISION     left leg  . PUBOVAGINAL SLING    . TEAR DUCT PROBING    . TUBAL LIGATION    . TYMPANOMASTOIDECTOMY  Left 04/28/2015  . TYMPANOMASTOIDECTOMY Left 04/28/2015   Procedure: LEFT TYMPANOMASTOIDECTOMY;  Surgeon: Leta Baptist, MD;  Location: [place];  Service: ENT;  Laterality: Left;  Marland Kitchen VENA CAVA FILTER PLACEMENT  1997   [place] filter   HPI:  83 year old female admitted 06/05/17 with SOB. PMH significant for COPD, PE, DVT, microcytic anemia, peripheral neuropathy, sepsis due to PNA (05/2017), deafness. CXR = LLL atelectasis/infiltrate, left pleural effusion   Assessment / Plan / Recommendation Clinical Impression  Pt presents in similar fashion to BSE completed 05/13/17, with no overt s/s aspiration observed or reported on any consistency. CN exam continues unremarkable, and pt denies difficulty swallowing. She does report dry mouth, however.   Given 2 hospitalizations in 2 months with PNA/ chest congestion, objective swallow evaluation with MBS is recommended to rule out silent aspiration as cause for recurrent congestion.   Pt is hoping to be DC'd tomorrow, so will try to plan MBS for in the morning. RN informed.     SLP Visit Diagnosis: Dysphagia, unspecified (R13.10)    Aspiration Risk  Mild aspiration risk    Diet Recommendation Regular;Thin liquid   Liquid Administration via: Cup;Straw Medication Administration: Whole meds with liquid Supervision: Patient able to self feed Compensations: Slow rate;Small sips/bites Postural Changes:  Seated upright at 90 degrees    Other  Recommendations Oral Care Recommendations: Oral care BID   Follow up Recommendations (TBD)      Frequency and Duration   pending MBS results/recommendations         Prognosis Prognosis for Safe Diet Advancement: Good      Swallow Study   General Date of Onset: 06/07/17 HPI: 83 year old female admitted 06/05/17 with SOB. PMH significant for COPD, PE, DVT, microcytic anemia, peripheral neuropathy, sepsis due to PNA (05/2017), deafness. CXR = LLL atelectasis/infiltrate, left pleural effusion Type of Study:  Bedside Swallow Evaluation Previous Swallow Assessment: BSE 05/13/17 - no overt difficulty, reg/thin recommended. No f/u Diet Prior to this Study: Regular;Thin liquids Temperature Spikes Noted: No Respiratory Status: Room air History of Recent Intubation: No Behavior/Cognition: Alert;Cooperative;Pleasant mood Oral Cavity Assessment: Within Functional Limits Oral Care Completed by SLP: No Oral Cavity - Dentition: Adequate natural dentition Vision: Functional for self-feeding Self-Feeding Abilities: Able to feed self Patient Positioning: Upright in chair Baseline Vocal Quality: Normal Volitional Cough: Strong Volitional Swallow: Able to elicit    Oral/Motor/Sensory Function Overall Oral Motor/Sensory Function: Within functional limits   Ice Chips Ice chips: Not tested   Thin Liquid Thin Liquid: Within functional limits Presentation: Self Fed;Straw    Nectar Thick Nectar Thick Liquid: Not tested   Honey Thick Honey Thick Liquid: Not tested   Puree Puree: Within functional limits Presentation: Spoon;Self Fed   Solid   GO   Solid: Within functional limits Presentation: [place] B. Quentin Ore, [facility], [place] Speech Language Pathologist [phone]  Shonna Chock 06/07/2017,3:35 PM

## 2017-06-07 NOTE — Evaluation (Signed)
Physical Therapy Evaluation Patient Details Name: [name] MRN: [id number] DOB: 1932/11/28 Today's Date: 06/07/2017   History of Present Illness  83 y.o. female with extensive medical issues including COPD, history of PE and DVT currently anticoagulated on Coumadin , Wegener's granulomatosis, microcytic anemia, and peripheral neuropathy, recent hospitalization for sepsis secondary to pneumonia on 05/11/2017, presenting to the ED from SNF with generalized weakness and decreased oral intake. She presented 12/4 from her nursing home with weakness, poor oral intake, new onset A. fib with RVR per EKG at SNF.  In the ER chest x-ray showed improved left pleural effusion but ongoing atelectasis versus infiltrate on the left.    Clinical Impression  Pt admitted with above diagnosis. Pt currently with functional limitations due to the deficits listed below (see PT Problem List). Pt is limited by deconditioning and weakness which exacerbates long standing L knee pain with standing. Pt is currently minA for bed mobility and modA for stand pivot transfer to recliner. Pt will benefit from skilled PT to increase their independence and safety with mobility to allow discharge to the venue listed below.       Follow Up Recommendations SNF    Equipment Recommendations  None recommended by PT    Recommendations for Other Services       Precautions / Restrictions Precautions Precautions: Fall Restrictions Weight Bearing Restrictions: No      Mobility  Bed Mobility Overal bed mobility: Needs Assistance Bed Mobility: Supine to Sit     Supine to sit: Min assist     General bed mobility comments: min A for pad scoot to EoB  Transfers Overall transfer level: Needs assistance Equipment used: Rolling walker (2 wheeled);1 person hand held assist Transfers: Stand Pivot Transfers Sit to Stand: Max assist Stand pivot transfers: Mod assist       General transfer comment: maxA for sit>stand in RW  and unable to steady herself, stand pivot transfer modA, L knee pain and buckling with step towards recliner   Ambulation/Gait             General Gait Details: unable to attempt due to weakness and L knee pain      Balance   Sitting-balance support: Feet supported;Bilateral upper extremity supported Sitting balance-Leahy Scale: Fair       Standing balance-Leahy Scale: Poor                               Pertinent Vitals/Pain Pain Assessment: Faces Faces Pain Scale: Hurts little more Pain Location: L knee with movement Pain Descriptors / Indicators: Grimacing;Guarding Pain Intervention(s): Repositioned;Monitored during session;Limited activity within patient's tolerance    Home Living Family/patient expects to be discharged to:: Assisted living               Home Equipment: Financial trader - single point      Prior Function Level of Independence: Needs assistance   Gait / Transfers Assistance Needed: uses scooter to meals  ADL's / Homemaking Assistance Needed: independent in ADLs, goes to dining room for meals           Extremity/Trunk Assessment   Upper Extremity Assessment Upper Extremity Assessment: Overall WFL for tasks assessed    Lower Extremity Assessment Lower Extremity Assessment: LLE deficits/detail;Generalized weakness RLE Deficits / Details: R strength grossly 4-/5 RLE Sensation: history of peripheral neuropathy LLE Deficits / Details: strength grossly assessed  hip 3/5, knee ext/ flex 2+/5, ankle plantar/dorsi 3/5  LLE Sensation: history of peripheral neuropathy       Communication   Communication: HOH  Cognition Arousal/Alertness: Awake/alert Behavior During Therapy: WFL for tasks assessed/performed Overall Cognitive Status: Within Functional Limits for tasks assessed                                        General Comments General comments (skin integrity, edema, etc.): in supine HR 123bpm, with  transfer HR rose to 132bpm, and recovered to 123 bpm within 1 minute of sitting, Son-in law present throughout session     Assessment/Plan    PT Assessment Patient needs continued PT services  PT Problem List Decreased strength;Decreased range of motion;Decreased activity tolerance;Decreased balance;Decreased mobility;Cardiopulmonary status limiting activity;Pain;Impaired sensation       PT Treatment Interventions DME instruction;Gait training;Functional mobility training;Therapeutic activities;Therapeutic exercise;Balance training;Patient/family education    PT Goals (Current goals can be found in the Care Plan section)  Acute Rehab PT Goals Patient Stated Goal: go to daughters house for Christmas day PT Goal Formulation: With patient/family Time For Goal Achievement: 06/21/17 Potential to Achieve Goals: Fair    Frequency Min 2X/week    AM-PAC PT "6 Clicks" Daily Activity  Outcome Measure Difficulty turning over in bed (including adjusting bedclothes, sheets and blankets)?: A Little Difficulty moving from lying on back to sitting on the side of the bed? : Unable Difficulty sitting down on and standing up from a chair with arms (e.g., wheelchair, bedside commode, etc,.)?: Unable Help needed moving to and from a bed to chair (including a wheelchair)?: A Lot Help needed walking in hospital room?: Total Help needed climbing 3-5 steps with a railing? : Total 6 Click Score: 9    End of Session Equipment Utilized During Treatment: Gait belt Activity Tolerance: Patient tolerated treatment well Patient left: in chair;with call bell/phone within reach;with chair alarm set;with family/visitor present Nurse Communication: Mobility status PT Visit Diagnosis: Unsteadiness on feet (R26.81);Other abnormalities of gait and mobility (R26.89);Muscle weakness (generalized) (M62.81);History of falling (Z91.81);Difficulty in walking, not elsewhere classified (R26.2);Pain;Other symptoms and signs  involving the nervous system (R29.898) Pain - Right/Left: Left Pain - part of body: Knee    Time: 0263-7858 PT Time Calculation (min) (ACUTE ONLY): 35 min   Charges:   PT Evaluation $PT Eval Moderate Complexity: 1 Mod PT Treatments $Therapeutic Activity: 8-22 mins   PT G Codes:        Suzann Lazaro B. Migdalia Dk PT, DPT Acute Rehabilitation  [phone] Pager [phone]    [place] 06/07/2017, 9:46 AM

## 2017-06-07 NOTE — Plan of Care (Signed)
  No Outcome SLP Dysphagia Goals Patient will utilize recommended strategies Description Patient will utilize recommended strategies during swallow to increase swallowing safety with 06/07/2017 1525 by Colon Flattery B, CCC-SLP Flowsheets Taken 06/07/2017 1525  Patient will utilize recommended strategies during swallow to increase swallowing safety with  min assist Misc Dysphagia Goal 06/07/2017 1525 by Colon Flattery B, CCC-SLP Flowsheets Taken 06/07/2017 1525  Misc Dysphagia Goal  Pt will tolerate least restrictive diet without overt s/s aspiration or decline in respiratory status.

## 2017-06-07 NOTE — Progress Notes (Signed)
Multiple IV restarts on pt (per IV team, pt has had new IV placed daily since admission)- IV team recommending PICC line d/t IV abx. Will inform oncoming nurse.

## 2017-06-08 ENCOUNTER — Inpatient Hospital Stay (HOSPITAL_COMMUNITY): Payer: Medicare Other

## 2017-06-08 DIAGNOSIS — R1310 Dysphagia, unspecified: Secondary | ICD-10-CM

## 2017-06-08 LAB — CBC WITH DIFFERENTIAL/PLATELET
BASOS ABS: 0 10*3/uL (ref 0.0–0.1)
Basophils Relative: 0 %
EOS PCT: 9 %
Eosinophils Absolute: 0.4 10*3/uL (ref 0.0–0.7)
HEMATOCRIT: 27 % — AB (ref 36.0–46.0)
Hemoglobin: 8.5 g/dL — ABNORMAL LOW (ref 12.0–15.0)
LYMPHS ABS: 0.8 10*3/uL (ref 0.7–4.0)
Lymphocytes Relative: 17 %
MCH: 34.8 pg — ABNORMAL HIGH (ref 26.0–34.0)
MCHC: 31.5 g/dL (ref 30.0–36.0)
MCV: 110.7 fL — AB (ref 78.0–100.0)
MONO ABS: 0.6 10*3/uL (ref 0.1–1.0)
MONOS PCT: 14 %
NEUTROS ABS: 2.7 10*3/uL (ref 1.7–7.7)
Neutrophils Relative %: 60 %
PLATELETS: 261 10*3/uL (ref 150–400)
RBC: 2.44 MIL/uL — AB (ref 3.87–5.11)
RDW: 16.9 % — AB (ref 11.5–15.5)
WBC: 4.5 10*3/uL (ref 4.0–10.5)

## 2017-06-08 LAB — PROTIME-INR
INR: 2.39
Prothrombin Time: 25.9 seconds — ABNORMAL HIGH (ref 11.4–15.2)

## 2017-06-08 LAB — BASIC METABOLIC PANEL
ANION GAP: 4 — AB (ref 5–15)
BUN: 6 mg/dL (ref 6–20)
CALCIUM: 9.2 mg/dL (ref 8.9–10.3)
CO2: 25 mmol/L (ref 22–32)
CREATININE: 0.88 mg/dL (ref 0.44–1.00)
Chloride: 110 mmol/L (ref 101–111)
GFR calc Af Amer: >60 mL/min (ref >60)
GFR, EST NON AFRICAN AMERICAN: 60 mL/min — AB (ref >60)
GLUCOSE: 92 mg/dL (ref 65–99)
Potassium: 3.9 mmol/L (ref 3.5–5.1)
Sodium: 139 mmol/L (ref 135–145)

## 2017-06-08 MED ORDER — RESOURCE THICKENUP CLEAR PO POWD
ORAL | Status: DC | PRN
  Filled 2017-06-08: qty 125

## 2017-06-08 MED ORDER — METOPROLOL TARTRATE 12.5 MG HALF TABLET: 12.5000 mg | ORAL_TABLET | Freq: Two times a day (BID) | ORAL | Status: DC

## 2017-06-08 MED ORDER — FOOD THICKENER (SIMPLYTHICK)
1.0000 | Freq: Three times a day (TID) | ORAL | Status: DC
  Administered 2017-06-08: 1 via ORAL
  Filled 2017-06-08 (×2): qty 1

## 2017-06-08 MED ORDER — LORAZEPAM 0.5 MG PO TABS: 0.5000 mg | ORAL_TABLET | Freq: Every day | ORAL | 0 refills | Status: DC

## 2017-06-08 MED ORDER — METOPROLOL TARTRATE 25 MG PO TABS: 12.5000 mg | ORAL_TABLET | Freq: Two times a day (BID) | ORAL | 0 refills | Status: DC

## 2017-06-08 MED ORDER — FOOD THICKENER (SIMPLYTHICK): 1.0000 | Freq: Three times a day (TID) | ORAL | 0 refills | Status: AC

## 2017-06-08 NOTE — Discharge Instructions (Signed)
The patient was slightly dehydrated.  Her heart rate improved with IV fluids and oral fluids.  Please make sure that you are monitoring her intake of fluids.  She showed no signs of infection.  Her chest x-ray does show an improving pneumonia and pleural effusion.  This needs to be followed up with her primary care doctor this week.  Do not feel that patient needs continue antibiotics at this time.  It is very important the patient follows up with her primary care doctor, stays hydrated and return to the ED if she develops any worsening fast heart rates for complaints of chest pain or shortness of breath.   =============================================================================================  Information on my medicine - Coumadin   (Warfarin)  This medication education was reviewed with me or my healthcare representative as part of my discharge preparation.  The pharmacist that spoke with me during my hospital stay was:  Arty Baumgartner, [facility]  Why was Coumadin prescribed for you? Coumadin was prescribed for you because you have had a blood clot or a medical condition that can cause an increased risk of forming blood clots. Blood clots can cause serious health problems by blocking the flow of blood to the heart, lung, or brain. Coumadin can prevent harmful blood clots from forming. As a reminder your indication for Coumadin is:   Deep Vein Thrombosis Treatment Prior blood clots in legs and lungs.  What test will check on my response to Coumadin? While on Coumadin (warfarin) you will need to have an INR test regularly to ensure that your dose is keeping you in the desired range. The INR (international normalized ratio) number is calculated from the result of the laboratory test called prothrombin time (PT).  If an INR APPOINTMENT HAS NOT ALREADY BEEN MADE FOR YOU please schedule an appointment to have this lab work done by your health care provider within 7 days. Your INR goal is usually  a number between:  2 to 3 or your provider may give you a more narrow range like 2-2.5.  Ask your health care provider during an office visit what your goal INR is.  What  do you need to  know  About  COUMADIN? Take Coumadin (warfarin) exactly as prescribed by your healthcare provider about the same time each day.  DO NOT stop taking without talking to the doctor who prescribed the medication.  Stopping without other blood clot prevention medication to take the place of Coumadin may increase your risk of developing a new clot or stroke.  Get refills before you run out.  What do you do if you miss a dose? If you miss a dose, take it as soon as you remember on the same day then continue your regularly scheduled regimen the next day.  Do not take two doses of Coumadin at the same time.  Important Safety Information A possible side effect of Coumadin (Warfarin) is an increased risk of bleeding. You should call your healthcare provider right away if you experience any of the following: ? Bleeding from an injury or your nose that does not stop. ? Unusual colored urine (red or dark brown) or unusual colored stools (red or black). ? Unusual bruising for unknown reasons. ? A serious fall or if you hit your head (even if there is no bleeding).  Some foods or medicines interact with Coumadin (warfarin) and might alter your response to warfarin. To help avoid this: ? Eat a balanced diet, maintaining a consistent amount of Vitamin K. ?  Notify your provider about major diet changes you plan to make. ? Avoid alcohol or limit your intake to 1 drink for women and 2 drinks for men per day. (1 drink is 5 oz. wine, 12 oz. beer, or 1.5 oz. liquor.)  Make sure that ANY health care provider who prescribes medication for you knows that you are taking Coumadin (warfarin).  Also make sure the healthcare provider who is monitoring your Coumadin knows when you have started a new medication including herbals and  non-prescription products.  Coumadin (Warfarin)  Major Drug Interactions  Increased Warfarin Effect Decreased Warfarin Effect  Alcohol (large quantities) Antibiotics (esp. Septra/Bactrim, Flagyl, Cipro) Amiodarone (Cordarone) Aspirin (ASA) Cimetidine (Tagamet) Megestrol (Megace) NSAIDs (ibuprofen, naproxen, etc.) Piroxicam (Feldene) Propafenone (Rythmol SR) Propranolol (Inderal) Isoniazid (INH) Posaconazole (Noxafil) Barbiturates (Phenobarbital) Carbamazepine (Tegretol) Chlordiazepoxide (Librium) Cholestyramine (Questran) Griseofulvin Oral Contraceptives Rifampin Sucralfate (Carafate) Vitamin K   Coumadin (Warfarin) Major Herbal Interactions  Increased Warfarin Effect Decreased Warfarin Effect  Garlic Ginseng Ginkgo biloba Coenzyme Q10 Green tea St. Johns wort    Coumadin (Warfarin) FOOD Interactions  Eat a consistent number of servings per week of foods HIGH in Vitamin K (1 serving =  cup)  Collards (cooked, or boiled & drained) Kale (cooked, or boiled & drained) Mustard greens (cooked, or boiled & drained) Parsley *serving size only =  cup Spinach (cooked, or boiled & drained) Swiss chard (cooked, or boiled & drained) Turnip greens (cooked, or boiled & drained)  Eat a consistent number of servings per week of foods MEDIUM-HIGH in Vitamin K (1 serving = 1 cup)  Asparagus (cooked, or boiled & drained) Broccoli (cooked, boiled & drained, or raw & chopped) Brussel sprouts (cooked, or boiled & drained) *serving size only =  cup Lettuce, raw (green leaf, endive, romaine) Spinach, raw Turnip greens, raw & chopped   These websites have more information on Coumadin (warfarin):  [employer]se; VeganReport.com.au;

## 2017-06-08 NOTE — Progress Notes (Signed)
[name] to be D/C'd [place] per MD order.  Discussed with the patient and all questions fully answered.  VSS, Skin clean, dry and intact without evidence of skin break down, no evidence of skin tears noted. IV catheter discontinued intact. Site without signs and symptoms of complications. Dressing and pressure applied.  An After Visit Summary was printed and given to the patient. Report was given to [place]. Patient received prescription.   D/c education completed with patient/family including follow up instructions, medication list, d/c activities limitations if indicated, with other d/c instructions as indicated by MD - patient able to verbalize understanding, all questions fully answered.   Patient instructed to return to ED, call 911, or call MD for any changes in condition.   Patient escorted via stretcher, and D/C to [facility] via ambulance.  Richardean Chimera 06/08/2017 2:54 PM

## 2017-06-08 NOTE — Discharge Summary (Addendum)
Physician Discharge Summary  [name] OZH:[id number] DOB: [birth year] DOA: 06/05/2017  PCP: Blanchie Serve, MD  Admit date: 06/05/2017 Discharge date: 06/08/2017  Admitted From: Home Disposition:  SNF  Recommendations for Outpatient Follow-up:  1. Follow up with PCP in 1-weeks 2. Patient has been placed on low dose b blockade to prevent tachycardia 3. Continue anticoagulation with warfarin. 4. Patient will need to continue speech therapy at a skilled nursing facility, given her silent aspiration.   Home Health: no  Equipment/Devices: no   Discharge Condition: stable CODE STATUS: dnr  Diet recommendation: Heart healthy  SLP Diet Recommendations: Regular solids;Nectar thick liquid Liquid Administration via: Cup;No straw Medication Administration: Whole meds with liquid Supervision: Patient able to self feed Compensations: Slow rate;Small sips/bites;Minimize environmental distractions Postural Changes: Remain semi-upright after after feeds/meals (Comment);Seated upright at 90 degrees   Oral Care Recommendations: Oral care QID   Brief/Interim Summary: 83 year old female who presented with dyspnea. Patient does have significant past medical history of COPD, history of PE and DVT, and Wegener's granulomatosis. Patient was transferred from the skilled nursing facility due to worsening weakness for several days, associated with poor oral intake and tachycardia. She was found to be in atrial fibrillation rhythm. On initial physical examination, blood pressure 105/75, heart rate 113, respiratory 21, oxygen saturation 100%. Dry mucous membranes, her lungs had decrease breath sounds bilaterally with positive wheezing, no rhonchi or rales, heart S1-S2 present, irregular, tachycardic, abdomen soft nontender, positive lower extremity edema. Sodium 137, potassium 4.2, chloride 107, bicarbonate 23, glucose 119, BUN 5, creatinine 0.91. White cell count 7.4, hemoglobin 10.4, hematocrit 32.6,  platelets 317, INR 2.31, urinalysis with 6-30 white cells, negative nitrates. Chest x-ray with right rotation, hypoinflation, chest CT with large bullae in the left apical zone, left pleural effusion with associated atelectasis at the left lower lobe. EKG was sinus rhythm, rate of 96 minute, normal axis, normal intervals.  Patient was admitted to the hospital working diagnosis of left pleural effusion with possible associated pneumonia, rule out underlying tachyarrhythmia.    1. Left pleural effusion with associated atelectasis, pneumonia was ruled out. Patient was admitted to the medical ward, she was placed on a remote telemetry monitor, oximetry monitoring supplemental oxygen per nasal cannula. Patient remained afebrile, no leukocytosis, upon review of her clinical presentation pneumonia was ruled and antibiotics were discontinued. Patient was found to have silent aspiration per speech therapy, recommendations to continue speech therapy at the skilled nursing facility, diet has been changed to regular solids with nectar thick liquids.   2. Multifocal atrial tachycardia. Patient was placed on remote telemetry monitor, no defibrillation as documented. Detailed review of electrocardiogram showed 3 different contiguous p wave morphologiespatient consistent with multifocal atrial tachycardiawas, patient was placed on metoprolol with improvement of heart rate.     3. Wegner's granulomatosis. Patient does have large bulla in left apical lobe, no clinical signs of exacerbation, recommend to follow-up as an outpatient.  4. History of deep vein thrombosis and pulmonary embolism. She was continued on warfarin for anticoagulation, INR remained therapeutic, no complications. Her discharge INR is 2.39.   5. Mild pharyngeal dysphagia. Patient was evaluated by speech therapy, diet was modified for aspiration precautions.   Discharge Diagnoses:  Active Problems:   WEGENERS GRANULOMATOSIS   Sinusitis, chronic    Intrinsic asthma   Anemia of chronic disease   GERD (gastroesophageal reflux disease)   Personal history of PE (pulmonary embolism)   History of aspergillosis   Deaf   Cardiomegaly  Vitamin D deficiency   Pulmonary nodules   Generalized anxiety disorder   Fatigue   Vision loss of left eye   OAB (overactive bladder)   HCAP (healthcare-associated pneumonia)   Tachycardia   Pleural effusion, left    Discharge Instructions   Allergies as of 06/08/2017      Reactions   Adhesive [tape] Rash   Caused a rash on lower extremities       Medication List    TAKE these medications   acetaminophen 325 MG tablet Commonly known as:  TYLENOL Take 650 mg by mouth See admin instructions. 650 mg at bedtime every night and may take an additional 650 mg every 12 hours as needed for pain   CALCIUM 600-D 600-400 MG-UNIT Tabs Generic drug:  Calcium Carbonate-Vitamin D3 Take 1 tablet by mouth daily.   diclofenac sodium 1 % Gel Commonly known as:  VOLTAREN Apply 2 g topically 3 (three) times daily. Apply to left shoulder.   Fluticasone-Salmeterol 500-50 MCG/DOSE Aepb Commonly known as:  ADVAIR DISKUS INHALE 1 PUFF EVERY 12 HOURS. What changed:    how much to take  how to take this  when to take this  additional instructions   folic acid 1 MG tablet Commonly known as:  FOLVITE Take 1 mg by mouth daily.   food thickener Powd Commonly known as:  SIMPLYTHICK Take 1 packet by mouth 4 (four) times daily -  with meals and at bedtime.   gabapentin 100 MG capsule Commonly known as:  NEURONTIN Take 100 mg by mouth 3 (three) times daily.   guaiFENesin 600 MG 12 hr tablet Commonly known as:  MUCINEX Take 600 mg by mouth every 12 (twelve) hours.   guaiFENesin-dextromethorphan 100-10 MG/5ML syrup Commonly known as:  ROBITUSSIN DM Take 5 mLs every 4 (four) hours as needed by mouth for cough.   hydrocortisone cream 1 % Apply 1 application topically 4 (four) times daily as needed for  itching.   ipratropium 0.02 % nebulizer solution Commonly known as:  ATROVENT Take 2.5 mLs (0.5 mg total) 3 (three) times daily by nebulization.   latanoprost 0.005 % ophthalmic solution Commonly known as:  XALATAN Place 1 drop into both eyes at bedtime.   levalbuterol 0.63 MG/3ML nebulizer solution Commonly known as:  XOPENEX Take 0.63 mg by nebulization every 6 (six) hours as needed for wheezing or shortness of breath.   LORazepam 0.5 MG tablet Commonly known as:  ATIVAN Take 1 tablet (0.5 mg total) by mouth at bedtime.   metoprolol tartrate 25 MG tablet Commonly known as:  LOPRESSOR Take 0.5 tablets (12.5 mg total) by mouth 2 (two) times daily.   NAPHCON-A 0.025-0.3 % ophthalmic solution Generic drug:  naphazoline-pheniramine Place 1 drop 3 (three) times daily into the left eye.   omeprazole 20 MG capsule Commonly known as:  PRILOSEC Take 20 mg by mouth daily.   oxybutynin 5 MG tablet Commonly known as:  DITROPAN One twice daily to help bladder control What changed:    how much to take  how to take this  when to take this  additional instructions   potassium chloride SA 20 MEQ tablet Commonly known as:  K-DUR,KLOR-CON Take 40 mEq by mouth 2 (two) times daily.   saccharomyces boulardii 250 MG capsule Commonly known as:  FLORASTOR Take 250 mg by mouth daily.   SORE THROAT SPRAY 1.4 % Liqd Generic drug:  phenol Use as directed 1 spray every 8 (eight) hours as needed in the mouth or  throat for throat irritation / pain.   sulfamethoxazole-trimethoprim 800-160 MG tablet Commonly known as:  BACTRIM DS,SEPTRA DS Take 1 tablet by mouth every Monday, Wednesday, and Friday.   traZODone 50 MG tablet Commonly known as:  DESYREL Take 25 mg by mouth at bedtime as needed for sleep (STOP DATE: 06/15/17).   Vitamin D-3 1000 units Caps Take 1,000 Units by mouth daily. What changed:  Another medication with the same name was removed. Continue taking this medication,  and follow the directions you see here.   warfarin 3 MG tablet Commonly known as:  COUMADIN Take 3 mg by mouth daily.      Follow-up Information    Blanchie Serve, MD.   Specialty:  Internal Medicine Contact information: [address] [place] [address] [phone]          Allergies  Allergen Reactions  . Adhesive [Tape] Rash    Caused a rash on lower extremities     Consultations:  Pulmonary    Procedures/Studies: Dg Chest 2 View  Result Date: 06/07/2017 CLINICAL DATA:  Pneumonia.  Productive cough. EXAM: CHEST  2 VIEW COMPARISON:  CT 06/06/2017.  Chest x-ray 06/05/2017 . FINDINGS: Mediastinum and hilar structures normal. Heart size normal. Left base atelectasis/ infiltrate with left-sided pleural effusion noted. Low lung volumes. Stable left apical bullous changes. No pneumothorax. Thoracic spine scoliosis. IMPRESSION: 1. Left base atelectasis/infiltrate and left-sided pleural effusion again noted. No significant change from prior CT. 2. Stable left apical bullous changes again noted. Electronically Signed   By: Marcello Moores  Register   On: 06/07/2017 08:21   Dg Chest 2 View  Result Date: 06/05/2017 CLINICAL DATA:  Shortness of breath. EXAM: CHEST  2 VIEW COMPARISON:  Radiographs of May 14, 2017. FINDINGS: Stable cardiomediastinal silhouette. Atherosclerosis of thoracic aorta is noted. No pneumothorax is noted. Right lung is clear. Mild left pleural effusion is noted with adjacent atelectasis or infiltrate. This is slightly improved compared to prior exam. Bony thorax unremarkable. IMPRESSION: Aortic atherosclerosis. Slightly improved left pleural effusion with adjacent atelectasis or infiltrate. Electronically Signed   By: Marijo Conception, M.D.   On: 06/05/2017 21:53   Dg Chest 2 View  Result Date: 05/14/2017 CLINICAL DATA:  Productive cough. History of asthma, bronchitis, previous episodes of pneumonia, remote history of smoking. EXAM: CHEST  2 VIEW COMPARISON:   Chest x-ray of May 11, 2017 FINDINGS: The right lung is adequately inflated. There is no focal infiltrate. On the left there is patchy increased density at the base with partial obscuration of the hemidiaphragm. The heart is normal in size. The pulmonary vascularity is not engorged. There is calcification in the wall of the thoracic aorta. The bony thorax exhibits no acute abnormality. IMPRESSION: Left basilar atelectasis or pneumonia. Underlying chronic bronchitic changes. Followup PA and lateral chest X-ray is recommended in 3-4 weeks following trial of antibiotic therapy to ensure resolution and exclude underlying malignancy. Thoracic aortic atherosclerosis. Electronically Signed   By: David  Martinique M.D.   On: 05/14/2017 13:24   Dg Chest 2 View  Result Date: 05/11/2017 CLINICAL DATA:  Initial evaluation for acute shortness of breath and fever. EXAM: CHEST  2 VIEW COMPARISON:  Prior radiograph from 02/01/2017. FINDINGS: Cardiomegaly, stable from previous. Mediastinal silhouette within normal limits. Aortic atherosclerosis. Lungs are hypoinflated. Patchy left lower lobe infiltrate, concerning for pneumonia given provided history. Streaky right basilar atelectasis. Perihilar vascular congestion without overt pulmonary edema. No pleural effusion. No pneumothorax. No acute osseus abnormality. Degenerative changes about the  left shoulder. IMPRESSION: 1. Patchy left lower lobe infiltrate, concerning for pneumonia given provided history. 2. Stable cardiomegaly with mild perihilar vascular congestion without frank pulmonary edema. Electronically Signed   By: Jeannine Boga M.D.   On: 05/11/2017 19:43   Ct Angio Chest Pe W/cm &/or Wo Cm  Result Date: 06/06/2017 CLINICAL DATA:  83 year old female with tachycardia and hypoxia. History of PE and DVT. EXAM: CT ANGIOGRAPHY CHEST WITH CONTRAST TECHNIQUE: Multidetector CT imaging of the chest was performed using the standard protocol during bolus administration  of intravenous contrast. Multiplanar CT image reconstructions and MIPs were obtained to evaluate the vascular anatomy. CONTRAST:  141mL ISOVUE-370 IOPAMIDOL (ISOVUE-370) INJECTION 76% COMPARISON:  Chest radiograph dated 06/05/2017 and CT dated 10/16/2016 FINDINGS: Cardiovascular: There is no cardiomegaly or pericardial effusion. There is moderate atherosclerotic calcification of the thoracic aorta. No aneurysmal dilatation or evidence of dissection. The origins of the great vessels of the aortic arch appear patent. There is no CT evidence of pulmonary embolism. Mediastinum/Nodes: No hilar adenopathy. Esophagus is grossly unremarkable as visualized. No mediastinal fluid collection. Lungs/Pleura: Left apical subpleural bulla appears similar to prior CT. There is a moderate-sized left pleural effusion, new since the prior CT. There is consolidative changes of the left lower lobe with areas of nodularity. This may be related to compressive atelectasis but concerning for superimposed infection. Clinical correlation is recommended. Right infrahilar hazy density may be combination of atelectasis and scarring. Areas of nodular scarring is seen in the right middle lobe. There is no pneumothorax. The central airways are patent. Upper Abdomen: No acute abnormality. Musculoskeletal: Degenerative changes of the spine. No acute osseous pathology. Review of the MIP images confirms the above findings. IMPRESSION: 1. No CT evidence of pulmonary embolism. 2. Moderate left pleural effusion with associated partial compressive atelectasis of left lower lobe. Areas of nodularity in the left lower lobe concerning for superimposed infection. Clinical correlation is recommended. 3. Left apical subpleural bulla similar to prior CT. 4. Right infrahilar atelectasis/scarring. 5.  Aortic Atherosclerosis (ICD10-I70.0). Electronically Signed   By: Anner Crete M.D.   On: 06/06/2017 05:17   Dg Swallowing Func-speech Pathology  Result Date:  06/08/2017 Objective Swallowing Evaluation: Type of Study: MBS-Modified Barium Swallow Study  Patient Details Name: DENICE CARDON MRN: [id number] Date of Birth: 02/10/1933 Today's Date: 06/08/2017 Time: SLP Start Time (ACUTE ONLY): 0935 -SLP Stop Time (ACUTE ONLY): 1005 SLP Time Calculation (min) (ACUTE ONLY): 30 min Past Medical History: Past Medical History: Diagnosis Date . Acute respiratory failure with hypoxia ([place])  . Anemia  . Aspergillosis ([place])  . Bladder incontinence  . Blindness of one eye  . Cancer ([place])   melanoma   ([place]) . Chronic sinusitis  . Clotting disorder ([place])  . Difficult intubation   Difficult airway with intubation for ARF 07/22/14 (due to anterior larynx, also had mucous plug) . Fatigue 12/09/2015 . Glaucoma  . Hearing loss in left ear   hearing aid both ears . History of pulmonary embolism 1997 . Interstitial emphysema ([place])  . Left shoulder pain 12/09/2015 . Multiple allergies  . Neuropathy  . OA (osteoarthritis)  . Pneumonia 12/15 . Shortness of breath dyspnea  . Stroke [facility])   ??? blind left eye . Wegener's granulomatosis ([place])  Past Surgical History: Past Surgical History: Procedure Laterality Date . bronchosocpy    pt. states she has had over 10 in last 20 yrs. Marland Kitchen CATARACT EXTRACTION   . COCHLEAR IMPLANT Left 10/27/2015  Procedure: LEFT COCHLEAR IMPLANT;  Surgeon:  Leta Baptist, MD;  Location: [place];  Service: ENT;  Laterality: Left; . INGUINAL HERNIA REPAIR Left 02/17/2014  Procedure: LEFT INGUINAL HERNIA REPAIR WITH MESH;  Surgeon: Harl Bowie, MD;  Location: [place];  Service: General;  Laterality: Left; . INSERTION OF MESH N/A 02/17/2014  Procedure: INSERTION OF MESH;  Surgeon: Harl Bowie, MD;  Location: [facility];  Service: General;  Laterality: N/A; . KNEE ARTHROSCOPY  2006  rt  . MELANOMA EXCISION    left leg . PUBOVAGINAL SLING   . TEAR DUCT PROBING   . TUBAL LIGATION   . TYMPANOMASTOIDECTOMY Left 04/28/2015 . TYMPANOMASTOIDECTOMY Left  04/28/2015  Procedure: LEFT TYMPANOMASTOIDECTOMY;  Surgeon: Leta Baptist, MD;  Location: [place];  Service: ENT;  Laterality: Left; Marland Kitchen VENA CAVA FILTER PLACEMENT  1997  [place] filter HPI: 83 year old female admitted 06/05/17 with SOB. PMH significant for COPD, PE, DVT, microcytic anemia, peripheral neuropathy, sepsis due to PNA (05/2017), deafness. CXR = LLL atelectasis/infiltrate, left pleural effusion  Subjective: Pt seen in radiology for MBS. No family present Assessment / Plan / Recommendation CHL IP CLINICAL IMPRESSIONS 06/08/2017 Clinical Impression Pt presents with Mild pharyngeal dysphagia, with both motor and sensory issues present. Across consistencies, the swallow reflex was delayed, with trigger noted at the level of the pyriform on thin, nectar thick, and puree consistencies. Solid texture triggered at the level of the vallecular sinus. Pt exhibited SILENT ASPIRATION of thin liquids, which occurred before, during, and after the swallow with cup or straw use. Nectar thick liquid via straw resulted in penetration during the swallow, but penetrate was cleared and not aspirated.  No penetration of nectar thick liquids via cup sip. Minimal post-swallow residue was noted after the swallow across consistencies. At this time, recommend regular consistency solids with nectar thick liquids, NO STRAWS. Given plan for pt to discharge today, recommend outpatient or homehealth speech therapy to follow up with patient for home exercise program and dysphagia therapy. PT was informed of tested results in writing, given difficulty hearing.  MD also informed of results and recommendations. Safe swallow precautions were sent to pt room with transport.   SLP Visit Diagnosis Dysphagia, pharyngeal phase (R13.13) Impact on safety and function Mild aspiration risk;Moderate aspiration risk   CHL IP TREATMENT RECOMMENDATION 06/08/2017 Treatment Recommendations Therapy as outlined in treatment plan below   Prognosis 06/08/2017 Prognosis  for Safe Diet Advancement Good CHL IP DIET RECOMMENDATION 06/08/2017 SLP Diet Recommendations Regular solids;Nectar thick liquid Liquid Administration via Cup;No straw Medication Administration Whole meds with liquid Compensations Slow rate;Small sips/bites;Minimize environmental distractions Postural Changes Remain semi-upright after after feeds/meals (Comment);Seated upright at 90 degrees   CHL IP OTHER RECOMMENDATIONS 06/08/2017 Oral Care Recommendations Oral care QID Other Recommendations Order thickener from pharmacy;Clarify dietary restrictions   CHL IP FOLLOW UP RECOMMENDATIONS 06/08/2017 Follow up Recommendations Home health SLP;Outpatient SLP   CHL IP FREQUENCY AND DURATION 06/08/2017 Speech Therapy Frequency (ACUTE ONLY) min 1 x/week Treatment Duration 1 week;2 weeks      CHL IP ORAL PHASE 06/08/2017 Oral Phase WFL  CHL IP PHARYNGEAL PHASE 06/08/2017 Pharyngeal Phase Impaired Pharyngeal- Nectar Cup Delayed swallow initiation-vallecula Pharyngeal- Nectar Straw Delayed swallow initiation-vallecula;Penetration/Aspiration during swallow;Delayed swallow initiation-pyriform sinuses Pharyngeal Material enters airway, remains ABOVE vocal cords and not ejected out Pharyngeal- Thin Cup Delayed swallow initiation-pyriform sinuses;Penetration/Aspiration during swallow;Penetration/Aspiration before swallow Pharyngeal Material enters airway, passes BELOW cords without attempt by patient to eject out (silent aspiration) Pharyngeal- Thin Straw Delayed swallow initiation-pyriform sinuses;Penetration/Apiration after  swallow;Penetration/Aspiration during swallow Pharyngeal Material enters airway, passes BELOW cords without attempt by patient to eject out (silent aspiration) Pharyngeal- Puree Delayed swallow initiation-pyriform sinuses Pharyngeal- Regular Delayed swallow initiation-vallecula  CHL IP CERVICAL ESOPHAGEAL PHASE 06/08/2017 Cervical Esophageal Phase [facility] Celia B. Quentin Ore [facility], CCC-SLP Speech Language Pathologist [phone]  Shonna Chock 06/08/2017, 10:49 AM                  Subjective: Patient feeling better, dyspnea at her baseline, no chest pain or palpitation, no nausea or vomiting.   Discharge Exam: Vitals:   06/08/17 0747 06/08/17 1030  BP:  (!) 92/55  Pulse:  67  Resp:    Temp:    SpO2: 93%    Vitals:   06/08/17 0624 06/08/17 0627 06/08/17 0747 06/08/17 1030  BP: (!) 96/38 (!) 108/44  (!) 92/55  Pulse: 80 84  67  Resp: (!) 26     Temp: 97.7 F (36.5 C)     TempSrc: Oral     SpO2: 93%  93%   Weight:      Height:        General: Pt is alert, awake, not in acute distress E ENT. Mild pallor with no icterus, oral mucosa moist.  Cardiovascular: RRR, S1/S2 +, no rubs, no gallops Respiratory: decreased breath sounds, no wheezing, no rhonchi, no rales Abdominal: Soft, NT, ND, bowel sounds + Extremities: no edema, no cyanosis    The results of significant diagnostics from this hospitalization (including imaging, microbiology, ancillary and laboratory) are listed below for reference.     Microbiology: Recent Results (from the past 240 hour(s))  Urine culture     Status: None   Collection Time: 06/06/17 12:38 AM  Result Value Ref Range Status   Specimen Description URINE, CATHETERIZED  Final   Special Requests NONE  Final   Culture NO GROWTH  Final   Report Status 06/07/2017 FINAL  Final  Culture, blood (routine x 2)     Status: None (Preliminary result)   Collection Time: 06/06/17  6:30 AM  Result Value Ref Range Status   Specimen Description BLOOD RIGHT HAND  Final   Special Requests IN PEDIATRIC BOTTLE Blood Culture adequate volume  Final   Culture NO GROWTH 1 DAY  Final   Report Status PENDING  Incomplete  Culture, blood (routine x 2)     Status: None (Preliminary result)   Collection Time: 06/06/17  6:45 AM  Result Value Ref Range Status   Specimen Description BLOOD RIGHT ANTECUBITAL  Final   Special Requests   Final    BOTTLES DRAWN AEROBIC AND ANAEROBIC Blood  Culture adequate volume   Culture NO GROWTH 1 DAY  Final   Report Status PENDING  Incomplete  Culture, expectorated sputum-assessment     Status: None   Collection Time: 06/06/17  8:54 AM  Result Value Ref Range Status   Specimen Description SPUTUM  Final   Special Requests NONE  Final   Sputum evaluation THIS SPECIMEN IS ACCEPTABLE FOR SPUTUM CULTURE  Final   Report Status 06/06/2017 FINAL  Final  Culture, respiratory (NON-Expectorated)     Status: None (Preliminary result)   Collection Time: 06/06/17  8:54 AM  Result Value Ref Range Status   Specimen Description SPUTUM  Final   Special Requests NONE Reflexed from [id number]  Final   Gram Stain   Final    FEW WBC PRESENT, PREDOMINANTLY PMN MODERATE GRAM POSITIVE COCCI IN PAIRS IN CHAINS FEW GRAM NEGATIVE RODS  Culture CULTURE REINCUBATED FOR BETTER GROWTH  Final   Report Status PENDING  Incomplete  Respiratory Panel by PCR     Status: None   Collection Time: 06/06/17  9:19 AM  Result Value Ref Range Status   Adenovirus NOT DETECTED NOT DETECTED Final   Coronavirus 229E NOT DETECTED NOT DETECTED Final   Coronavirus HKU1 NOT DETECTED NOT DETECTED Final   Coronavirus NL63 NOT DETECTED NOT DETECTED Final   Coronavirus OC43 NOT DETECTED NOT DETECTED Final   Metapneumovirus NOT DETECTED NOT DETECTED Final   Rhinovirus / Enterovirus NOT DETECTED NOT DETECTED Final   Influenza A NOT DETECTED NOT DETECTED Final   Influenza B NOT DETECTED NOT DETECTED Final   Parainfluenza Virus 1 NOT DETECTED NOT DETECTED Final   Parainfluenza Virus 2 NOT DETECTED NOT DETECTED Final   Parainfluenza Virus 3 NOT DETECTED NOT DETECTED Final   Parainfluenza Virus 4 NOT DETECTED NOT DETECTED Final   Respiratory Syncytial Virus NOT DETECTED NOT DETECTED Final   Bordetella pertussis NOT DETECTED NOT DETECTED Final   Chlamydophila pneumoniae NOT DETECTED NOT DETECTED Final   Mycoplasma pneumoniae NOT DETECTED NOT DETECTED Final     Labs: BNP (last 3  results) Recent Labs    06/06/17 0847  BNP 94.8   Basic Metabolic Panel: Recent Labs  Lab 06/04/17 06/05/17 1026 06/05/17 1850 06/08/17 0230  NA 140 143 137 139  K 4.1 4.5 4.2 3.9  CL  --  107 107 110  CO2  --  25 23 25   GLUCOSE  --  136* 119* 92  BUN 8 7 5* 6  CREATININE 1.0 1.0 0.91 0.88  CALCIUM  --  9.6 9.7 9.2   Liver Function Tests: Recent Labs  Lab 06/05/17 1026 06/05/17 1850  AST 41* 39  ALT 30 23  ALKPHOS 128* 129*  BILITOT 0.50 0.5  PROT 6.1* 6.0*  ALBUMIN 2.8* 3.0*   No results for input(s): LIPASE, AMYLASE in the last 168 hours. No results for input(s): AMMONIA in the last 168 hours. CBC: Recent Labs  Lab 06/05/17 1026 06/05/17 1850 06/08/17 0230  WBC 6.4 7.4 4.5  NEUTROABS 5.0 5.7 2.7  HGB 10.3* 10.4* 8.5*  HCT 31.3* 32.6* 27.0*  MCV 113* 109.0* 110.7*  PLT 296 317 261   Cardiac Enzymes: No results for input(s): CKTOTAL, CKMB, CKMBINDEX, TROPONINI in the last 168 hours. BNP: Invalid input(s): POCBNP CBG: No results for input(s): GLUCAP in the last 168 hours. D-Dimer No results for input(s): DDIMER in the last 72 hours. Hgb A1c No results for input(s): HGBA1C in the last 72 hours. Lipid Profile No results for input(s): CHOL, HDL, LDLCALC, TRIG, CHOLHDL, LDLDIRECT in the last 72 hours. Thyroid function studies Recent Labs    06/06/17 0305  TSH 1.160   Anemia work up No results for input(s): VITAMINB12, FOLATE, FERRITIN, TIBC, IRON, RETICCTPCT in the last 72 hours. Urinalysis    Component Value Date/Time   COLORURINE YELLOW 06/06/2017 0031   APPEARANCEUR CLEAR 06/06/2017 0031   LABSPEC 1.009 06/06/2017 0031   PHURINE 5.0 06/06/2017 0031   GLUCOSEU NEGATIVE 06/06/2017 0031   HGBUR SMALL (A) 06/06/2017 0031   BILIRUBINUR NEGATIVE 06/06/2017 0031   BILIRUBINUR neg 11/17/2014 1744   KETONESUR NEGATIVE 06/06/2017 0031   PROTEINUR NEGATIVE 06/06/2017 0031   UROBILINOGEN 0.2 11/17/2014 1744   UROBILINOGEN 0.2 07/20/2014 1340    NITRITE NEGATIVE 06/06/2017 0031   LEUKOCYTESUR NEGATIVE 06/06/2017 0031   Sepsis Labs Invalid input(s): PROCALCITONIN,  WBC,  [place] [facility]  Recent Results (from the past 240 hour(s))  Urine culture     Status: None   Collection Time: 06/06/17 12:38 AM  Result Value Ref Range Status   Specimen Description URINE, CATHETERIZED  Final   Special Requests NONE  Final   Culture NO GROWTH  Final   Report Status 06/07/2017 FINAL  Final  Culture, blood (routine x 2)     Status: None (Preliminary result)   Collection Time: 06/06/17  6:30 AM  Result Value Ref Range Status   Specimen Description BLOOD RIGHT HAND  Final   Special Requests IN PEDIATRIC BOTTLE Blood Culture adequate volume  Final   Culture NO GROWTH 1 DAY  Final   Report Status PENDING  Incomplete  Culture, blood (routine x 2)     Status: None (Preliminary result)   Collection Time: 06/06/17  6:45 AM  Result Value Ref Range Status   Specimen Description BLOOD RIGHT ANTECUBITAL  Final   Special Requests   Final    BOTTLES DRAWN AEROBIC AND ANAEROBIC Blood Culture adequate volume   Culture NO GROWTH 1 DAY  Final   Report Status PENDING  Incomplete  Culture, expectorated sputum-assessment     Status: None   Collection Time: 06/06/17  8:54 AM  Result Value Ref Range Status   Specimen Description SPUTUM  Final   Special Requests NONE  Final   Sputum evaluation THIS SPECIMEN IS ACCEPTABLE FOR SPUTUM CULTURE  Final   Report Status 06/06/2017 FINAL  Final  Culture, respiratory (NON-Expectorated)     Status: None (Preliminary result)   Collection Time: 06/06/17  8:54 AM  Result Value Ref Range Status   Specimen Description SPUTUM  Final   Special Requests NONE Reflexed from [id number]  Final   Gram Stain   Final    FEW WBC PRESENT, PREDOMINANTLY PMN MODERATE GRAM POSITIVE COCCI IN PAIRS IN CHAINS FEW GRAM NEGATIVE RODS    Culture CULTURE REINCUBATED FOR BETTER GROWTH  Final   Report Status PENDING  Incomplete   Respiratory Panel by PCR     Status: None   Collection Time: 06/06/17  9:19 AM  Result Value Ref Range Status   Adenovirus NOT DETECTED NOT DETECTED Final   Coronavirus 229E NOT DETECTED NOT DETECTED Final   Coronavirus HKU1 NOT DETECTED NOT DETECTED Final   Coronavirus NL63 NOT DETECTED NOT DETECTED Final   Coronavirus OC43 NOT DETECTED NOT DETECTED Final   Metapneumovirus NOT DETECTED NOT DETECTED Final   Rhinovirus / Enterovirus NOT DETECTED NOT DETECTED Final   Influenza A NOT DETECTED NOT DETECTED Final   Influenza B NOT DETECTED NOT DETECTED Final   Parainfluenza Virus 1 NOT DETECTED NOT DETECTED Final   Parainfluenza Virus 2 NOT DETECTED NOT DETECTED Final   Parainfluenza Virus 3 NOT DETECTED NOT DETECTED Final   Parainfluenza Virus 4 NOT DETECTED NOT DETECTED Final   Respiratory Syncytial Virus NOT DETECTED NOT DETECTED Final   Bordetella pertussis NOT DETECTED NOT DETECTED Final   Chlamydophila pneumoniae NOT DETECTED NOT DETECTED Final   Mycoplasma pneumoniae NOT DETECTED NOT DETECTED Final     Time coordinating discharge: 45 minutes  SIGNED:   Tawni Millers, MD  [facility] 06/08/2017, 1:13 PM Pager [phone]  If 7PM-7AM, please contact night-coverage [email or web address] Password [email or web address]

## 2017-06-08 NOTE — Clinical Social Work Note (Signed)
Clinical Social Work Assessment  Patient Details  Name: [name] MRN: [id number] Date of Birth: Mar 01, 1933  Date of referral:  06/07/17               Reason for consult:  Discharge Planning                Permission sought to share information with:  Facility Sport and exercise psychologist, Family Supports Permission granted to share information::  Yes, Verbal Permission Granted  Name::        Agency::  Friends  Home  Relationship::     Contact Information:     Housing/Transportation Living arrangements for the past 2 months:  [place], [place] of Information:  Patient, Adult Children Patient Interpreter Needed:  None Criminal Activity/Legal Involvement Pertinent to Current Situation/Hospitalization:  No - Comment as needed Significant Relationships:  Adult Children, Friend Lives with:  Facility Resident Do you feel safe going back to the place where you live?  Yes Need for family participation in patient care:  Yes (Comment)  Care giving concerns:  CSW received consult for possible SNF placement at time of discharge. CSW spoke with patient and family regarding discharge plan. Patient has been at [facility] and would like to return at discharge. CSW to continue to follow and assist with discharge planning needs.   Social Worker assessment / plan:  CSW spoke with patient concerning possibility of returning to rehab at [facility] before returning home.  Employment status:  Retired Nurse, adult PT Recommendations:  [place] / Referral to community resources:  [place]  Patient/Family's Response to care:  Patient and family recognize need for rehab before returning home and is agreeable to returning to [facility].   Patient/Family's Understanding of and Emotional Response to Diagnosis, Current Treatment, and Prognosis:  Patient/family is realistic regarding therapy  needs and expressed being hopeful for SNF placement. Patient expressed understanding of CSW role and discharge process as well as medical condition. No questions/concerns about plan or treatment.    Emotional Assessment Appearance:  Appears stated age Attitude/Demeanor/Rapport:  Other(Appropriate) Affect (typically observed):  Accepting, Appropriate Orientation:  Oriented to Self, Oriented to Situation, Oriented to Place Alcohol / Substance use:  Not Applicable Psych involvement (Current and /or in the community):  No (Comment)  Discharge Needs  Concerns to be addressed:  Care Coordination Readmission within the last 30 days:  Yes Current discharge risk:  None Barriers to Discharge:  Continued Medical Work up   [employer], [place] 06/08/2017, 2:15 PM

## 2017-06-08 NOTE — Consult Note (Signed)
Modified Barium Swallow Progress Note  Patient Details  Name: [name] MRN: [id number] Date of Birth: 1933-06-22  Today's Date: 06/08/2017  Modified Barium Swallow completed.  Full report located under Chart Review in the Imaging Section.  Brief recommendations include the following:  Clinical Impression Pt presents with Mild pharyngeal dysphagia, with both motor and sensory issues present. Across consistencies, the swallow reflex was delayed, with trigger noted at the level of the pyriform on thin, nectar thick, and puree consistencies. Solid texture triggered at the level of the vallecular sinus.   Pt exhibited SILENT ASPIRATION of thin liquids, which occurred before, during, and after the swallow with cup or straw use. Nectar thick liquid via straw resulted in penetration during the swallow, but penetrate was cleared and not aspirated.  No penetration of nectar thick liquids via cup sip. Minimal post-swallow residue was noted after the swallow across consistencies.   At this time, recommend regular consistency solids with nectar thick liquids, NO STRAWS. Given plan for pt to discharge today, recommend outpatient or homehealth speech therapy to follow up with patient for home exercise program and dysphagia therapy. PT was informed of tested results in writing, given difficulty hearing.  MD also informed of results and recommendations. Safe swallow precautions were sent to pt room with transport.      Swallow Evaluation Recommendations  SLP Diet Recommendations: Regular solids;Nectar thick liquid   Liquid Administration via: Cup;No straw   Medication Administration: Whole meds with liquid   Supervision: Patient able to self feed   Compensations: Slow rate;Small sips/bites;Minimize environmental distractions   Postural Changes: Remain semi-upright after after feeds/meals (Comment);Seated upright at 90 degrees   Oral Care Recommendations: Oral care QID   Other Recommendations:  Order thickener from pharmacy;Clarify dietary restrictions   Brion Sossamon B. Quentin Ore, [facility], [place] Speech Language Pathologist [phone]  Shonna Chock 06/08/2017,10:54 AM

## 2017-06-08 NOTE — Progress Notes (Signed)
Patient will DC to: [facility] Anticipated DC date: 06/08/17 Family notified: Daughter Transport by: Corey Harold   Per MD patient ready for DC to [facility]. RN, patient, patient's family, and facility notified of DC. Discharge Summary sent to facility. RN given number for report [phone] ask for patient's RN). DC packet on chart. Ambulance transport requested for patient.   CSW signing off.  Cedric Fishman, [place] Social Worker [phone]

## 2017-06-09 LAB — CULTURE, RESPIRATORY

## 2017-06-09 LAB — CULTURE, RESPIRATORY W GRAM STAIN: Culture: NORMAL

## 2017-06-11 ENCOUNTER — Encounter: Payer: Self-pay | Admitting: Internal Medicine

## 2017-06-11 LAB — CULTURE, BLOOD (ROUTINE X 2)
CULTURE: NO GROWTH
Culture: NO GROWTH
SPECIAL REQUESTS: ADEQUATE
SPECIAL REQUESTS: ADEQUATE

## 2017-06-12 ENCOUNTER — Encounter: Payer: Self-pay | Admitting: Internal Medicine

## 2017-06-12 ENCOUNTER — Non-Acute Institutional Stay (SKILLED_NURSING_FACILITY): Payer: Medicare Other | Admitting: Internal Medicine

## 2017-06-12 ENCOUNTER — Encounter: Payer: Self-pay | Admitting: *Deleted

## 2017-06-12 DIAGNOSIS — J90 Pleural effusion, not elsewhere classified: Secondary | ICD-10-CM

## 2017-06-12 DIAGNOSIS — I471 Supraventricular tachycardia: Secondary | ICD-10-CM | POA: Diagnosis not present

## 2017-06-12 DIAGNOSIS — J9612 Chronic respiratory failure with hypercapnia: Secondary | ICD-10-CM

## 2017-06-12 DIAGNOSIS — R1313 Dysphagia, pharyngeal phase: Secondary | ICD-10-CM

## 2017-06-12 DIAGNOSIS — R5381 Other malaise: Secondary | ICD-10-CM

## 2017-06-12 DIAGNOSIS — N3281 Overactive bladder: Secondary | ICD-10-CM

## 2017-06-12 DIAGNOSIS — G4700 Insomnia, unspecified: Secondary | ICD-10-CM

## 2017-06-12 DIAGNOSIS — K219 Gastro-esophageal reflux disease without esophagitis: Secondary | ICD-10-CM | POA: Diagnosis not present

## 2017-06-12 DIAGNOSIS — Z7901 Long term (current) use of anticoagulants: Secondary | ICD-10-CM | POA: Diagnosis not present

## 2017-06-12 DIAGNOSIS — J9611 Chronic respiratory failure with hypoxia: Secondary | ICD-10-CM | POA: Diagnosis not present

## 2017-06-12 DIAGNOSIS — M3130 Wegener's granulomatosis without renal involvement: Secondary | ICD-10-CM | POA: Diagnosis not present

## 2017-06-12 DIAGNOSIS — E876 Hypokalemia: Secondary | ICD-10-CM

## 2017-06-12 DIAGNOSIS — D638 Anemia in other chronic diseases classified elsewhere: Secondary | ICD-10-CM

## 2017-06-12 LAB — CBC
HCT: 25.7
HEMOGLOBIN: 9
WBC: 2.55
platelet count: 285

## 2017-06-12 NOTE — Progress Notes (Signed)
Provider:  Blanchie Serve MD  Location:  [facility] Room Number: 17 Place of Service:  SNF (31)  PCP: Blanchie Serve, MD [name], MD as PCP - General (Internal Medicine) Unice Bailey, MD as Consulting Physician (Rheumatology) Rozetta Nunnery, MD as Consulting Physician (Otolaryngology) Marcial Pacas, MD as Consulting Physician (Neurology) Michel Bickers, MD as Consulting Physician (Infectious Diseases) Marin Olp Rudell Cobb, MD as Consulting Physician (Oncology) Juanito Doom, MD as Consulting Physician (Pulmonary Disease) Tanda Rockers, MD as Consulting Physician (Pulmonary Disease) Estill Dooms, MD as Consulting Physician (Geriatric Medicine) Coralie Keens, MD as Consulting Physician (General Surgery) Mast, Man X, NP as Nurse Practitioner (Nurse Practitioner)  Extended Emergency Contact Information Primary Emergency Contact: [facility] Address: [place], [place] [address] Johnnette Litter of [place] Phone: [phone] Mobile Phone: [phone] Relation: Daughter Secondary Emergency Contact: Dunlow,[name] Address: [address] [place]          [place], [place] [address] [place] Phone: [phone] Mobile Phone: [phone] Relation: Son   Goals of Care: Advanced Directive information Advanced Directives 06/12/2017  Does Patient Have a Medical Advance Directive? Yes  Type of Paramedic of [place];Living will  Does patient want to make changes to medical advance directive? -  Copy of [place] in Chart? Yes  Would patient like information on creating a medical advance directive? -      Chief Complaint  Patient presents with  . Readmit To SNF    Re-admit to SNF    HPI: Patient is a 83 y.o. female seen today for re-admission visit. She was in the hospital from 06/05/17-06/08/17 with dyspnea and new onset  irregular heart rate with concern for afib with RVR. Workup in the hospital showed multifocal atrial tachycardia. She was placed on b blocker. She also had left pleural effusion with atelectasis. She was placed on oxygen and iv antibiotic. once Pneumonia was ruled out, antibiotic was discontinued. There was concern for parapneumonic effusion and thoracocentesis was recommended but family deferred for thoracocentesis. With her high risk for aspiration, SLP was consulted. Dietary changes were made.  She has medical history of wegner's granulomatosis, COPD, DVT and PE among others. She is seen in her room today. She feels short of breath with activities, denies any dyspnea at rest.   Past Medical History:  Diagnosis Date  . Acute respiratory failure with hypoxia ([place])   . Anemia   . Aspergillosis ([place])   . Bladder incontinence   . Blindness of one eye   . Cancer ([place])    melanoma   ([place])  . Chronic sinusitis   . Clotting disorder ([place])   . Difficult intubation    Difficult airway with intubation for ARF 07/22/14 (due to anterior larynx, also had mucous plug)  . Fatigue 12/09/2015  . Glaucoma   . Hearing loss in left ear    hearing aid both ears  . History of pulmonary embolism 1997  . Interstitial emphysema ([place])   . Left shoulder pain 12/09/2015  . Multiple allergies   . Neuropathy   . OA (osteoarthritis)   . Pneumonia 12/15  . Shortness of breath dyspnea   . Stroke [facility])    ??? blind left eye  . Wegener's granulomatosis ([place])    Past Surgical History:  Procedure Laterality Date  . bronchosocpy     pt.  states she has had over 10 in last 20 yrs.  Marland Kitchen CATARACT EXTRACTION    . COCHLEAR IMPLANT Left 10/27/2015   Procedure: LEFT COCHLEAR IMPLANT;  Surgeon: Leta Baptist, MD;  Location: [facility];  Service: ENT;  Laterality: Left;  . INGUINAL HERNIA REPAIR Left 02/17/2014   Procedure: LEFT INGUINAL HERNIA REPAIR WITH MESH;  Surgeon: Harl Bowie, MD;  Location: [facility];   Service: General;  Laterality: Left;  . INSERTION OF MESH N/A 02/17/2014   Procedure: INSERTION OF MESH;  Surgeon: Harl Bowie, MD;  Location: [facility];  Service: General;  Laterality: N/A;  . KNEE ARTHROSCOPY  2006   rt   . MELANOMA EXCISION     left leg  . PUBOVAGINAL SLING    . TEAR DUCT PROBING    . TUBAL LIGATION    . TYMPANOMASTOIDECTOMY Left 04/28/2015  . TYMPANOMASTOIDECTOMY Left 04/28/2015   Procedure: LEFT TYMPANOMASTOIDECTOMY;  Surgeon: Leta Baptist, MD;  Location: [facility];  Service: ENT;  Laterality: Left;  Marland Kitchen VENA CAVA FILTER PLACEMENT  1997   [place] filter    reports that she quit smoking about 32 years ago. Her smoking use included cigarettes. She has a 45.00 pack-year smoking history. she has never used smokeless tobacco. She reports that she does not drink alcohol or use drugs. Social History   Socioeconomic History  . Marital status: Married    Spouse name: Not on file  . Number of children: Not on file  . Years of education: Not on file  . Highest education level: Not on file  Social Needs  . Financial resource strain: Not on file  . Food insecurity - worry: Not on file  . Food insecurity - inability: Not on file  . Transportation needs - medical: Not on file  . Transportation needs - non-medical: Not on file  Occupational History  . Occupation: retired Games developer: RETIRED  Tobacco Use  . Smoking status: Former Smoker    Packs/day: 1.50    Years: 30.00    Pack years: 45.00    Types: Cigarettes    Last attempt to quit: 07/03/1984    Years since quitting: 32.9  . Smokeless tobacco: Never Used  . Tobacco comment: quit smoking 30 years ago  Substance and Sexual Activity  . Alcohol use: No    Alcohol/week: 0.0 oz  . Drug use: No  . Sexual activity: No  Other Topics Concern  . Not on file  Social History Narrative   Lives at [facility] moved to [place] 05/25/15   [place] dementia, died Nov 24, 2016   Former  smoker stopped 1986   Alcohol occasional wine   Exercise PT 3 x a week   Power wheelchair   Has POA, Living Will    Functional Status Survey:    Family History  Problem Relation Age of Onset  . Heart disease Father   . CVA Father   . Congestive Heart Failure Mother   . Heart disease Mother   . Lymphoma Sister   . Dementia Sister   . Alzheimer's disease Sister   . Uterine cancer Sister   . Skin cancer Sister   . Osteoarthritis Sister     Health Maintenance  Topic Date Due  . TETANUS/TDAP  07/03/2017 (Originally 09/29/1951)  . INFLUENZA VACCINE  Completed  . DEXA SCAN  Completed  . PNA vac Low Risk Adult  Completed    Allergies  Allergen Reactions  .  Adhesive [Tape] Rash    Caused a rash on lower extremities     Outpatient Encounter Medications as of 06/12/2017  Medication Sig  . acetaminophen (TYLENOL) 325 MG tablet Take 650 mg by mouth See admin instructions. 650 mg at bedtime every night and may take an additional 650 mg every 12 hours as needed for pain  . Calcium Carbonate-Vitamin D3 (CALCIUM 600-D) 600-400 MG-UNIT TABS Take 1 tablet by mouth daily.  . Cholecalciferol (VITAMIN D-3) 1000 units CAPS Take 1,000 Units by mouth daily.  . diclofenac sodium (VOLTAREN) 1 % GEL Apply 2 g topically 3 (three) times daily. Apply to left shoulder.  . Fluticasone-Salmeterol (ADVAIR DISKUS) 500-50 MCG/DOSE AEPB INHALE 1 PUFF EVERY 12 HOURS.  . folic acid (FOLVITE) 1 MG tablet Take 1 mg by mouth daily.   . food thickener (SIMPLYTHICK) POWD Take 1 packet by mouth 4 (four) times daily -  with meals and at bedtime.  . gabapentin (NEURONTIN) 100 MG capsule Take 100 mg by mouth 3 (three) times daily.  Marland Kitchen guaiFENesin (MUCINEX) 600 MG 12 hr tablet Take 600 mg by mouth every 12 (twelve) hours.   Marland Kitchen guaiFENesin-dextromethorphan (ROBITUSSIN DM) 100-10 MG/5ML syrup Take 5 mLs every 4 (four) hours as needed by mouth for cough.  . hydrocortisone cream 1 % Apply 1 application topically 4 (four)  times daily as needed for itching.   Marland Kitchen ipratropium (ATROVENT) 0.02 % nebulizer solution Take 2.5 mLs (0.5 mg total) 3 (three) times daily by nebulization.  Marland Kitchen latanoprost (XALATAN) 0.005 % ophthalmic solution Place 1 drop into both eyes at bedtime.   . levalbuterol (XOPENEX) 0.63 MG/3ML nebulizer solution Take 0.63 mg by nebulization every 6 (six) hours as needed for wheezing or shortness of breath.  Marland Kitchen LORazepam (ATIVAN) 0.5 MG tablet Take 1 tablet (0.5 mg total) by mouth at bedtime.  . metoprolol tartrate (LOPRESSOR) 25 MG tablet Take 0.5 tablets (12.5 mg total) by mouth 2 (two) times daily.  . naphazoline-pheniramine (NAPHCON-A) 0.025-0.3 % ophthalmic solution Place 1 drop 3 (three) times daily into the left eye.   Marland Kitchen omeprazole (PRILOSEC) 20 MG capsule Take 20 mg by mouth daily.  Marland Kitchen oxybutynin (DITROPAN) 5 MG tablet One twice daily to help bladder control  . phenol (SORE THROAT SPRAY) 1.4 % LIQD Use as directed 1 spray every 8 (eight) hours as needed in the mouth or throat for throat irritation / pain.   . potassium chloride SA (K-DUR,KLOR-CON) 20 MEQ tablet Take 40 mEq by mouth 2 (two) times daily.  Marland Kitchen saccharomyces boulardii (FLORASTOR) 250 MG capsule Take 250 mg by mouth daily.   Marland Kitchen sulfamethoxazole-trimethoprim (BACTRIM DS,SEPTRA DS) 800-160 MG tablet Take 1 tablet by mouth every Monday, Wednesday, and Friday.   . traZODone (DESYREL) 50 MG tablet Take 25 mg by mouth at bedtime as needed for sleep (STOP DATE: 06/15/17).   . warfarin (COUMADIN) 3 MG tablet Take 3 mg by mouth daily.   No facility-administered encounter medications on file as of 06/12/2017.     Review of Systems  Constitutional: Positive for fatigue. Negative for appetite change, chills, diaphoresis and fever.  HENT: Positive for hearing loss, rhinorrhea and trouble swallowing. Negative for congestion, mouth sores and sore throat.   Eyes: Negative for pain.  Respiratory: Positive for cough and shortness of breath. Negative for  wheezing.   Cardiovascular: Negative for chest pain, palpitations and leg swelling.  Gastrointestinal: Negative for abdominal pain, diarrhea, nausea and vomiting.  Genitourinary: Negative for dysuria and frequency.  Musculoskeletal:  Positive for gait problem. Negative for back pain.  Skin: Negative for rash and wound.  Neurological: Positive for weakness. Negative for dizziness and headaches.  Hematological: Bruises/bleeds easily.  Psychiatric/Behavioral: Positive for confusion. Negative for behavioral problems and sleep disturbance.    Vitals:   06/12/17 1207  BP: 124/72  Pulse: 70  Resp: 20  Temp: (!) 96.7 F (35.9 C)  SpO2: 94%  Weight: 127 lb 4.8 oz (57.7 kg)  Height: 5\' 1"  (1.549 m)   Body mass index is 24.05 kg/m.   Wt Readings from Last 3 Encounters:  06/13/17 127 lb (57.6 kg)  06/12/17 127 lb 4.8 oz (57.7 kg)  06/08/17 127 lb 4.8 oz (57.7 kg)   Physical Exam  Constitutional: No distress.  Elderly frail in no acute distress  HENT:  Head: Normocephalic and atraumatic.  Mouth/Throat: Oropharynx is clear and moist. No oropharyngeal exudate.  Hard of hearing and has hearing aids.   Eyes: Conjunctivae are normal. Right eye exhibits no discharge. Left eye exhibits no discharge.  Neck: Neck supple.  Cardiovascular: Normal rate and regular rhythm.  Pulmonary/Chest: Effort normal. No respiratory distress. She has no wheezes. She has rales.  Abdominal: Soft. Bowel sounds are normal. There is no tenderness. There is no guarding.  Musculoskeletal: She exhibits edema and deformity. She exhibits no tenderness.  Lymphadenopathy:    She has no cervical adenopathy.  Neurological: She is alert.  Oriented to person, place and time  Skin: Skin is warm and dry. No rash noted. She is not diaphoretic.  Psychiatric: She has a normal mood and affect.    Labs reviewed: Basic Metabolic Panel: Recent Labs    06/05/17 1026 06/05/17 1850 06/08/17 0230  NA 143 137 139  K 4.5 4.2  3.9  CL 107 107 110  CO2 25 23 25   GLUCOSE 136* 119* 92  BUN 7 5* 6  CREATININE 1.0 0.91 0.88  CALCIUM 9.6 9.7 9.2   Liver Function Tests: Recent Labs    05/11/17 1815 06/05/17 1026 06/05/17 1850  AST 42* 41* 39  ALT 30 30 23   ALKPHOS 153* 128* 129*  BILITOT 0.8 0.50 0.5  PROT 5.9* 6.1* 6.0*  ALBUMIN 3.1* 2.8* 3.0*   No results for input(s): LIPASE, AMYLASE in the last 8760 hours. No results for input(s): AMMONIA in the last 8760 hours. CBC: Recent Labs    06/05/17 1026 06/05/17 1850 06/08/17 0230 06/12/17  WBC 6.4 7.4 4.5 2.55  NEUTROABS 5.0 5.7 2.7  --   HGB 10.3* 10.4* 8.5* 9.0  HCT 31.3* 32.6* 27.0* 25.7  MCV 113* 109.0* 110.7*  --   PLT 296 317 261  --    Cardiac Enzymes: No results for input(s): CKTOTAL, CKMB, CKMBINDEX, TROPONINI in the last 8760 hours. BNP: Invalid input(s): POCBNP Lab Results  Component Value Date   HGBA1C 5.8 (H) 07/28/2014   Lab Results  Component Value Date   TSH 1.160 06/06/2017   Lab Results  Component Value Date   RXVQMGQQ76 195 07/28/2014   Lab Results  Component Value Date   FOLATE 12.4 07/28/2014   Lab Results  Component Value Date   IRON 67 06/05/2017   TIBC 146 (L) 06/05/2017   FERRITIN 604 (H) 06/05/2017    Imaging and Procedures obtained prior to SNF admission: Dg Chest 2 View  Result Date: 06/05/2017 CLINICAL DATA:  Shortness of breath. EXAM: CHEST  2 VIEW COMPARISON:  Radiographs of May 14, 2017. FINDINGS: Stable cardiomediastinal silhouette. Atherosclerosis of thoracic aorta is noted.  No pneumothorax is noted. Right lung is clear. Mild left pleural effusion is noted with adjacent atelectasis or infiltrate. This is slightly improved compared to prior exam. Bony thorax unremarkable. IMPRESSION: Aortic atherosclerosis. Slightly improved left pleural effusion with adjacent atelectasis or infiltrate. Electronically Signed   By: Marijo Conception, M.D.   On: 06/05/2017 21:53   Ct Angio Chest Pe W/cm &/or Wo  Cm  Result Date: 06/06/2017 CLINICAL DATA:  83 year old female with tachycardia and hypoxia. History of PE and DVT. EXAM: CT ANGIOGRAPHY CHEST WITH CONTRAST TECHNIQUE: Multidetector CT imaging of the chest was performed using the standard protocol during bolus administration of intravenous contrast. Multiplanar CT image reconstructions and MIPs were obtained to evaluate the vascular anatomy. CONTRAST:  158mL ISOVUE-370 IOPAMIDOL (ISOVUE-370) INJECTION 76% COMPARISON:  Chest radiograph dated 06/05/2017 and CT dated 10/16/2016 FINDINGS: Cardiovascular: There is no cardiomegaly or pericardial effusion. There is moderate atherosclerotic calcification of the thoracic aorta. No aneurysmal dilatation or evidence of dissection. The origins of the great vessels of the aortic arch appear patent. There is no CT evidence of pulmonary embolism. Mediastinum/Nodes: No hilar adenopathy. Esophagus is grossly unremarkable as visualized. No mediastinal fluid collection. Lungs/Pleura: Left apical subpleural bulla appears similar to prior CT. There is a moderate-sized left pleural effusion, new since the prior CT. There is consolidative changes of the left lower lobe with areas of nodularity. This may be related to compressive atelectasis but concerning for superimposed infection. Clinical correlation is recommended. Right infrahilar hazy density may be combination of atelectasis and scarring. Areas of nodular scarring is seen in the right middle lobe. There is no pneumothorax. The central airways are patent. Upper Abdomen: No acute abnormality. Musculoskeletal: Degenerative changes of the spine. No acute osseous pathology. Review of the MIP images confirms the above findings. IMPRESSION: 1. No CT evidence of pulmonary embolism. 2. Moderate left pleural effusion with associated partial compressive atelectasis of left lower lobe. Areas of nodularity in the left lower lobe concerning for superimposed infection. Clinical correlation is  recommended. 3. Left apical subpleural bulla similar to prior CT. 4. Right infrahilar atelectasis/scarring. 5.  Aortic Atherosclerosis (ICD10-I70.0). Electronically Signed   By: Anner Crete M.D.   On: 06/06/2017 05:17    Assessment/Plan  Physical deconditioning With weakness from recent hospitalization and her medical co-morbidities. Will have her work with physical therapy and occupational therapy team to help with gait training and muscle strengthening exercises.fall precautions. Skin care. Encourage to be out of bed. Patient remains a high readmission risk with her co-morbidities and deconditioning. Will need to have care plan meeting with her family to review goals of care.   MAT Controlled HR, denies palpitation. C/w metoprolol tartrate 12.5 mg bid  Left sided pleural effusion Pneumonia has been ruled out. Continue o2 for now and symptomatic management  Dysphagia Aspiration precautions, dysphagia diet and to follow with SLP team.  Hypokalemia Continue kcl supplement, check bmp  Chronic respiratory failure With wegner's granulomatosis and emphysema. Continue o2 by nasal canula, cough expectorant bid and as needed, continue bronchodilators  Chronic anticoagulation With history of DVT and PE. Continue warfarin and monitor INR  Wegner's granulomatosis No signs of exacerbation, supportive care  Anemia of chronic disease Low but stable Hb, monitor cbc  Insomnia Continue with prn trazodone and monitor  gerd Controlled symptom, continue prilosec 20 mg daily.  OAB Perineal care. Continue oxybutynin  Neuropathy Continue her gabapentin 100 mg tid and monitor  GAD Stable woth lorazepam 0.5 mg qhs for now  Family/ staff Communication: reviewed care plan with patient and charge nurse.    Labs/tests ordered: cbc, bmp  Blanchie Serve, MD Internal Medicine [facility] [address] [place],  [address] Cell Phone  (Monday-Friday 8 am - 5 pm): [phone] On Call: [phone] and follow prompts after 5 pm and on weekends Office Phone: [phone] Office Fax: [phone]

## 2017-06-13 ENCOUNTER — Non-Acute Institutional Stay (SKILLED_NURSING_FACILITY): Payer: Medicare Other | Admitting: Nurse Practitioner

## 2017-06-13 DIAGNOSIS — B3749 Other urogenital candidiasis: Secondary | ICD-10-CM | POA: Diagnosis not present

## 2017-06-13 DIAGNOSIS — M3130 Wegener's granulomatosis without renal involvement: Secondary | ICD-10-CM | POA: Diagnosis not present

## 2017-06-13 NOTE — Assessment & Plan Note (Signed)
Nystatin powder bid to affected area until healed.

## 2017-06-13 NOTE — Progress Notes (Signed)
Location:   [facility] [place] Room Number: 17 Place of Service:  SNF (31) Provider: Lennie Odor Lyn Deemer NP  Blanchie Serve, MD  [name], MD as PCP - General (Internal Medicine) Unice Bailey, MD as Consulting Physician (Rheumatology) Rozetta Nunnery, MD as Consulting Physician (Otolaryngology) Marcial Pacas, MD as Consulting Physician (Neurology) Michel Bickers, MD as Consulting Physician (Infectious Diseases) Marin Olp Rudell Cobb, MD as Consulting Physician (Oncology) Juanito Doom, MD as Consulting Physician (Pulmonary Disease) Tanda Rockers, MD as Consulting Physician (Pulmonary Disease) Estill Dooms, MD as Consulting Physician (Geriatric Medicine) Coralie Keens, MD as Consulting Physician (General Surgery) Stacye Noori X, NP as Nurse Practitioner (Nurse Practitioner)  Extended Emergency Contact Information Primary Emergency Contact: [facility] Address: [place], [place] [address] [place] of [place] Phone: [phone] Mobile Phone: [phone] Relation: Daughter Secondary Emergency Contact: Lisa,[name] Address: [address] [place]          [place], [place] [address] [place] of [place] Phone: [phone] Mobile Phone: [phone] Relation: Son  Code Status: DNR Goals of care: Advanced Directive information Advanced Directives 06/12/2017  Does Patient Have a Medical Advance Directive? Yes  Type of Paramedic of [place];Living will  Does patient want to make changes to medical advance directive? -  Copy of [place] in Chart? Yes  Would patient like information on creating a medical advance directive? -     Chief Complaint  Patient presents with  . Acute Visit    Rednness to buttocks    HPI:  Pt is a 83 y.o. female seen today for an acute visit for c/o painful/burning reddened area in groins, failed Vaseline, she is incontinent of urine,  wears adult dependents for incontinent of urine. Recent ABT use for pulmonary infection.    Past Medical History:  Diagnosis Date  . Acute respiratory failure with hypoxia ([place])   . Anemia   . Aspergillosis ([place])   . Bladder incontinence   . Blindness of one eye   . Cancer ([place])    melanoma   (chemo)  . Chronic sinusitis   . Clotting disorder ([place])   . Difficult intubation    Difficult airway with intubation for ARF 07/22/14 (due to anterior larynx, also had mucous plug)  . Fatigue 12/09/2015  . Glaucoma   . Hearing loss in left ear    hearing aid both ears  . History of pulmonary embolism 1997  . Interstitial emphysema ([place])   . Left shoulder pain 12/09/2015  . Multiple allergies   . Neuropathy   . OA (osteoarthritis)   . Pneumonia 12/15  . Shortness of breath dyspnea   . Stroke [name])    ??? blind left eye  . Wegener's granulomatosis ([place])    Past Surgical History:  Procedure Laterality Date  . bronchosocpy     pt. states she has had over 10 in last 20 yrs.  Marland Kitchen CATARACT EXTRACTION    . COCHLEAR IMPLANT Left 10/27/2015   Procedure: LEFT COCHLEAR IMPLANT;  Surgeon: Leta Baptist, MD;  Location: [place];  Service: ENT;  Laterality: Left;  . INGUINAL HERNIA REPAIR Left 02/17/2014   Procedure: LEFT INGUINAL HERNIA REPAIR WITH MESH;  Surgeon: Harl Bowie, MD;  Location: [place];  Service: General;  Laterality: Left;  . INSERTION OF MESH N/A 02/17/2014   Procedure: INSERTION OF MESH;  Surgeon: Marco Collie  Ninfa Linden, MD;  Location: [place];  Service: General;  Laterality: N/A;  . KNEE ARTHROSCOPY  2006   rt   . MELANOMA EXCISION     left leg  . PUBOVAGINAL SLING    . TEAR DUCT PROBING    . TUBAL LIGATION    . TYMPANOMASTOIDECTOMY Left 04/28/2015  . TYMPANOMASTOIDECTOMY Left 04/28/2015   Procedure: LEFT TYMPANOMASTOIDECTOMY;  Surgeon: Leta Baptist, MD;  Location: [place];  Service: ENT;  Laterality: Left;  Marland Kitchen VENA CAVA FILTER PLACEMENT  1997   [place]  filter    Allergies  Allergen Reactions  . Adhesive [Tape] Rash    Caused a rash on lower extremities     Allergies as of 06/13/2017      Reactions   Adhesive [tape] Rash   Caused a rash on lower extremities       Medication List        Accurate as of 06/13/17 11:59 PM. Always use your most recent med list.          acetaminophen 325 MG tablet Commonly known as:  TYLENOL Take 650 mg by mouth See admin instructions. 650 mg at bedtime every night and may take an additional 650 mg every 12 hours as needed for pain   CALCIUM 600-D 600-400 MG-UNIT Tabs Generic drug:  Calcium Carbonate-Vitamin D3 Take 1 tablet by mouth daily.   diclofenac sodium 1 % Gel Commonly known as:  VOLTAREN Apply 2 g topically 3 (three) times daily. Apply to left shoulder.   Fluticasone-Salmeterol 500-50 MCG/DOSE Aepb Commonly known as:  ADVAIR DISKUS INHALE 1 PUFF EVERY 12 HOURS.   folic acid 1 MG tablet Commonly known as:  FOLVITE Take 1 mg by mouth daily.   food thickener Powd Commonly known as:  SIMPLYTHICK Take 1 packet by mouth 4 (four) times daily -  with meals and at bedtime.   gabapentin 100 MG capsule Commonly known as:  NEURONTIN Take 100 mg by mouth 3 (three) times daily.   guaiFENesin 600 MG 12 hr tablet Commonly known as:  MUCINEX Take 600 mg by mouth every 12 (twelve) hours.   guaiFENesin-dextromethorphan 100-10 MG/5ML syrup Commonly known as:  ROBITUSSIN DM Take 5 mLs every 4 (four) hours as needed by mouth for cough.   hydrocortisone cream 1 % Apply 1 application topically 4 (four) times daily as needed for itching.   ipratropium 0.02 % nebulizer solution Commonly known as:  ATROVENT Take 2.5 mLs (0.5 mg total) 3 (three) times daily by nebulization.   latanoprost 0.005 % ophthalmic solution Commonly known as:  XALATAN Place 1 drop into both eyes at bedtime.   levalbuterol 0.63 MG/3ML nebulizer solution Commonly known as:  XOPENEX Take 0.63 mg by nebulization  every 6 (six) hours as needed for wheezing or shortness of breath.   LORazepam 0.5 MG tablet Commonly known as:  ATIVAN Take 1 tablet (0.5 mg total) by mouth at bedtime.   metoprolol tartrate 25 MG tablet Commonly known as:  LOPRESSOR Take 0.5 tablets (12.5 mg total) by mouth 2 (two) times daily.   NAPHCON-A 0.025-0.3 % ophthalmic solution Generic drug:  naphazoline-pheniramine Place 1 drop 3 (three) times daily into the left eye.   omeprazole 20 MG capsule Commonly known as:  PRILOSEC Take 20 mg by mouth daily.   oxybutynin 5 MG tablet Commonly known as:  DITROPAN One twice daily to help bladder control   potassium chloride SA 20 MEQ tablet Commonly known as:  K-DUR,KLOR-CON Take 40 mEq  by mouth 2 (two) times daily.   saccharomyces boulardii 250 MG capsule Commonly known as:  FLORASTOR Take 250 mg by mouth daily.   SORE THROAT SPRAY 1.4 % Liqd Generic drug:  phenol Use as directed 1 spray every 8 (eight) hours as needed in the mouth or throat for throat irritation / pain.   sulfamethoxazole-trimethoprim 800-160 MG tablet Commonly known as:  BACTRIM DS,SEPTRA DS Take 1 tablet by mouth every Monday, Wednesday, and Friday.   traZODone 50 MG tablet Commonly known as:  DESYREL Take 25 mg by mouth at bedtime as needed for sleep (STOP DATE: 06/15/17).   Vitamin D-3 1000 units Caps Take 1,000 Units by mouth daily.   warfarin 3 MG tablet Commonly known as:  COUMADIN Take 3 mg by mouth daily.       Review of Systems  Constitutional: Negative for activity change, appetite change, chills, diaphoresis, fatigue and fever.  HENT: Positive for hearing loss and trouble swallowing. Negative for congestion and voice change.   Eyes:       Left eye blind  Respiratory: Positive for cough. Negative for choking, chest tightness, shortness of breath and wheezing.   Cardiovascular: Positive for leg swelling. Negative for chest pain and palpitations.       Trace edema in ankles.     Genitourinary: Negative for difficulty urinating, dysuria and urgency.       Incontinent of urine  Skin: Positive for color change and rash.       Reddened area in groins.     Immunization History  Administered Date(s) Administered  . Influenza Split 03/04/2012, 03/11/2014, 04/01/2015  . Influenza,inj,Quad PF,6+ Mos 03/03/2013  . Influenza-Unspecified 04/01/2015, 04/19/2016, 04/23/2017  . PPD Test 05/25/2015  . Pneumococcal Conjugate-13 07/01/2015  . Pneumococcal Polysaccharide-23 09/02/2015  . Zoster 09/30/1994   Pertinent  Health Maintenance Due  Topic Date Due  . INFLUENZA VACCINE  Completed  . DEXA SCAN  Completed  . PNA vac Low Risk Adult  Completed   Fall Risk  03/20/2017 12/09/2015 09/23/2015 09/09/2015 02/03/2015  Falls in the past year? No No Yes Yes Yes  Number falls in past yr: - - 2 or more 2 or more 2 or more  Comment - - - - -  Injury with Fall? - - No No -   Functional Status Survey:    Vitals:   06/13/17 1520  BP: 110/66  Pulse: 95  Resp: 18  Temp: 98.6 F (37 C)  SpO2: 98%  Weight: 127 lb (57.6 kg)  Height: 5\' 1"  (1.549 m)   Body mass index is 24 kg/m. Physical Exam  Constitutional: She is oriented to person, place, and time. She appears well-developed and well-nourished.  HENT:  Head: Normocephalic and atraumatic.  Eyes: Right eye exhibits no discharge. Left eye exhibits no discharge.  Left eye blind.  Neck: Normal range of motion. Neck supple. No JVD present. No thyromegaly present.  Cardiovascular: Normal rate, regular rhythm and normal heart sounds.  No murmur heard. Pulmonary/Chest: Effort normal and breath sounds normal. She has no wheezes. She has no rales.  Decreased air entry  Abdominal:  External hemorrhoids noted, stable  Musculoskeletal: She exhibits edema.  Trace edema  Neurological: She is alert and oriented to person, place, and time. She exhibits normal muscle tone. Coordination normal.  Skin: Skin is warm and dry. Rash noted.  There is erythema. No pallor.  Urogenital/groins redness    Labs reviewed: Recent Labs    06/05/17 1026 06/05/17 1850 06/08/17  0230  NA 143 137 139  K 4.5 4.2 3.9  CL 107 107 110  CO2 25 23 25   GLUCOSE 136* 119* 92  BUN 7 5* 6  CREATININE 1.0 0.91 0.88  CALCIUM 9.6 9.7 9.2   Recent Labs    05/11/17 1815 06/05/17 1026 06/05/17 1850  AST 42* 41* 39  ALT 30 30 23   ALKPHOS 153* 128* 129*  BILITOT 0.8 0.50 0.5  PROT 5.9* 6.1* 6.0*  ALBUMIN 3.1* 2.8* 3.0*   Recent Labs    06/05/17 1026 06/05/17 1850 06/08/17 0230 06/12/17  WBC 6.4 7.4 4.5 2.55  NEUTROABS 5.0 5.7 2.7  --   HGB 10.3* 10.4* 8.5* 9.0  HCT 31.3* 32.6* 27.0* 25.7  MCV 113* 109.0* 110.7*  --   PLT 296 317 261  --    Lab Results  Component Value Date   TSH 1.160 06/06/2017   Lab Results  Component Value Date   HGBA1C 5.8 (H) 07/28/2014   No results found for: CHOL, HDL, LDLCALC, LDLDIRECT, TRIG, CHOLHDL  Significant Diagnostic Results in last 30 days:  Dg Chest 2 View  Result Date: 06/07/2017 CLINICAL DATA:  Pneumonia.  Productive cough. EXAM: CHEST  2 VIEW COMPARISON:  CT 06/06/2017.  Chest x-ray 06/05/2017 . FINDINGS: Mediastinum and hilar structures normal. Heart size normal. Left base atelectasis/ infiltrate with left-sided pleural effusion noted. Low lung volumes. Stable left apical bullous changes. No pneumothorax. Thoracic spine scoliosis. IMPRESSION: 1. Left base atelectasis/infiltrate and left-sided pleural effusion again noted. No significant change from prior CT. 2. Stable left apical bullous changes again noted. Electronically Signed   By: Marcello Moores  Register   On: 06/07/2017 08:21   Dg Chest 2 View  Result Date: 06/05/2017 CLINICAL DATA:  Shortness of breath. EXAM: CHEST  2 VIEW COMPARISON:  Radiographs of May 14, 2017. FINDINGS: Stable cardiomediastinal silhouette. Atherosclerosis of thoracic aorta is noted. No pneumothorax is noted. Right lung is clear. Mild left pleural effusion is  noted with adjacent atelectasis or infiltrate. This is slightly improved compared to prior exam. Bony thorax unremarkable. IMPRESSION: Aortic atherosclerosis. Slightly improved left pleural effusion with adjacent atelectasis or infiltrate. Electronically Signed   By: Marijo Conception, M.D.   On: 06/05/2017 21:53   Ct Angio Chest Pe W/cm &/or Wo Cm  Result Date: 06/06/2017 CLINICAL DATA:  83 year old female with tachycardia and hypoxia. History of PE and DVT. EXAM: CT ANGIOGRAPHY CHEST WITH CONTRAST TECHNIQUE: Multidetector CT imaging of the chest was performed using the standard protocol during bolus administration of intravenous contrast. Multiplanar CT image reconstructions and MIPs were obtained to evaluate the vascular anatomy. CONTRAST:  141mL ISOVUE-370 IOPAMIDOL (ISOVUE-370) INJECTION 76% COMPARISON:  Chest radiograph dated 06/05/2017 and CT dated 10/16/2016 FINDINGS: Cardiovascular: There is no cardiomegaly or pericardial effusion. There is moderate atherosclerotic calcification of the thoracic aorta. No aneurysmal dilatation or evidence of dissection. The origins of the great vessels of the aortic arch appear patent. There is no CT evidence of pulmonary embolism. Mediastinum/Nodes: No hilar adenopathy. Esophagus is grossly unremarkable as visualized. No mediastinal fluid collection. Lungs/Pleura: Left apical subpleural bulla appears similar to prior CT. There is a moderate-sized left pleural effusion, new since the prior CT. There is consolidative changes of the left lower lobe with areas of nodularity. This may be related to compressive atelectasis but concerning for superimposed infection. Clinical correlation is recommended. Right infrahilar hazy density may be combination of atelectasis and scarring. Areas of nodular scarring is seen in the right middle lobe. There  is no pneumothorax. The central airways are patent. Upper Abdomen: No acute abnormality. Musculoskeletal: Degenerative changes of the  spine. No acute osseous pathology. Review of the MIP images confirms the above findings. IMPRESSION: 1. No CT evidence of pulmonary embolism. 2. Moderate left pleural effusion with associated partial compressive atelectasis of left lower lobe. Areas of nodularity in the left lower lobe concerning for superimposed infection. Clinical correlation is recommended. 3. Left apical subpleural bulla similar to prior CT. 4. Right infrahilar atelectasis/scarring. 5.  Aortic Atherosclerosis (ICD10-I70.0). Electronically Signed   By: Anner Crete M.D.   On: 06/06/2017 05:17   Dg Swallowing Func-speech Pathology  Result Date: 06/08/2017 Objective Swallowing Evaluation: Type of Study: MBS-Modified Barium Swallow Study  Patient Details Name: [name] MRN: [id number] Date of Birth: [birth year] Today's Date: 06/08/2017 Time: SLP Start Time (ACUTE ONLY): 0935 -SLP Stop Time (ACUTE ONLY): 1005 SLP Time Calculation (min) (ACUTE ONLY): 30 min Past Medical History: Past Medical History: Diagnosis Date . Acute respiratory failure with hypoxia ([place])  . Anemia  . Aspergillosis ([place])  . Bladder incontinence  . Blindness of one eye  . Cancer ([place])   melanoma   (chemo) . Chronic sinusitis  . Clotting disorder ([place])  . Difficult intubation   Difficult airway with intubation for ARF 07/22/14 (due to anterior larynx, also had mucous plug) . Fatigue 12/09/2015 . Glaucoma  . Hearing loss in left ear   hearing aid both ears . History of pulmonary embolism 1997 . Interstitial emphysema ([place])  . Left shoulder pain 12/09/2015 . Multiple allergies  . Neuropathy  . OA (osteoarthritis)  . Pneumonia 12/15 . Shortness of breath dyspnea  . Stroke [facility])   ??? blind left eye . Wegener's granulomatosis ([place])  Past Surgical History: Past Surgical History: Procedure Laterality Date . bronchosocpy    pt. states she has had over 10 in last 20 yrs. Marland Kitchen CATARACT EXTRACTION   . COCHLEAR IMPLANT Left 10/27/2015  Procedure: LEFT COCHLEAR IMPLANT;  Surgeon: Leta Baptist,  MD;  Location: [place];  Service: ENT;  Laterality: Left; . INGUINAL HERNIA REPAIR Left 02/17/2014  Procedure: LEFT INGUINAL HERNIA REPAIR WITH MESH;  Surgeon: Harl Bowie, MD;  Location: [place];  Service: General;  Laterality: Left; . INSERTION OF MESH N/A 02/17/2014  Procedure: INSERTION OF MESH;  Surgeon: Harl Bowie, MD;  Location: [place];  Service: General;  Laterality: N/A; . KNEE ARTHROSCOPY  2006  rt  . MELANOMA EXCISION    left leg . PUBOVAGINAL SLING   . TEAR DUCT PROBING   . TUBAL LIGATION   . TYMPANOMASTOIDECTOMY Left 04/28/2015 . TYMPANOMASTOIDECTOMY Left 04/28/2015  Procedure: LEFT TYMPANOMASTOIDECTOMY;  Surgeon: Leta Baptist, MD;  Location: [place];  Service: ENT;  Laterality: Left; Marland Kitchen VENA CAVA FILTER PLACEMENT  1997  [place] filter HPI: 83 year old female admitted 06/05/17 with SOB. PMH significant for COPD, PE, DVT, microcytic anemia, peripheral neuropathy, sepsis due to PNA (05/2017), deafness. CXR = LLL atelectasis/infiltrate, left pleural effusion  Subjective: Pt seen in radiology for MBS. No family present Assessment / Plan / Recommendation CHL IP CLINICAL IMPRESSIONS 06/08/2017 Clinical Impression Pt presents with Mild pharyngeal dysphagia, with both motor and sensory issues present. Across consistencies, the swallow reflex was delayed, with trigger noted at the level of the pyriform on thin, nectar thick, and puree consistencies. Solid texture triggered at the level of the vallecular sinus. Pt exhibited SILENT ASPIRATION of thin liquids, which occurred before, during, and after the  swallow with cup or straw use. Nectar thick liquid via straw resulted in penetration during the swallow, but penetrate was cleared and not aspirated.  No penetration of nectar thick liquids via cup sip. Minimal post-swallow residue was noted after the swallow across consistencies. At this time, recommend regular consistency solids with nectar thick liquids, NO STRAWS. Given  plan for pt to discharge today, recommend outpatient or homehealth speech therapy to follow up with patient for home exercise program and dysphagia therapy. PT was informed of tested results in writing, given difficulty hearing.  MD also informed of results and recommendations. Safe swallow precautions were sent to pt room with transport.   SLP Visit Diagnosis Dysphagia, pharyngeal phase (R13.13) Impact on safety and function Mild aspiration risk;Moderate aspiration risk   CHL IP TREATMENT RECOMMENDATION 06/08/2017 Treatment Recommendations Therapy as outlined in treatment plan below   Prognosis 06/08/2017 Prognosis for Safe Diet Advancement Good CHL IP DIET RECOMMENDATION 06/08/2017 SLP Diet Recommendations Regular solids;Nectar thick liquid Liquid Administration via Cup;No straw Medication Administration Whole meds with liquid Compensations Slow rate;Small sips/bites;Minimize environmental distractions Postural Changes Remain semi-upright after after feeds/meals (Comment);Seated upright at 90 degrees   CHL IP OTHER RECOMMENDATIONS 06/08/2017 Oral Care Recommendations Oral care QID Other Recommendations Order thickener from pharmacy;Clarify dietary restrictions   CHL IP FOLLOW UP RECOMMENDATIONS 06/08/2017 Follow up Recommendations Home health SLP;Outpatient SLP   CHL IP FREQUENCY AND DURATION 06/08/2017 Speech Therapy Frequency (ACUTE ONLY) min 1 x/week Treatment Duration 1 week;2 weeks      CHL IP ORAL PHASE 06/08/2017 Oral Phase WFL  CHL IP PHARYNGEAL PHASE 06/08/2017 Pharyngeal Phase Impaired Pharyngeal- Nectar Cup Delayed swallow initiation-vallecula Pharyngeal- Nectar Straw Delayed swallow initiation-vallecula;Penetration/Aspiration during swallow;Delayed swallow initiation-pyriform sinuses Pharyngeal Material enters airway, remains ABOVE vocal cords and not ejected out Pharyngeal- Thin Cup Delayed swallow initiation-pyriform sinuses;Penetration/Aspiration during swallow;Penetration/Aspiration before swallow  Pharyngeal Material enters airway, passes BELOW cords without attempt by patient to eject out (silent aspiration) Pharyngeal- Thin Straw Delayed swallow initiation-pyriform sinuses;Penetration/Apiration after swallow;Penetration/Aspiration during swallow Pharyngeal Material enters airway, passes BELOW cords without attempt by patient to eject out (silent aspiration) Pharyngeal- Puree Delayed swallow initiation-pyriform sinuses Pharyngeal- Regular Delayed swallow initiation-vallecula  CHL IP CERVICAL ESOPHAGEAL PHASE 06/08/2017 Cervical Esophageal Phase [facility] Celia B. [facility], CCC-SLP Speech Language Pathologist [phone] Shonna Chock 06/08/2017, 10:49 AM               Assessment/Plan: Candidiasis, urogenital Nystatin powder bid to affected area until healed.   WEGENERS GRANULOMATOSIS The patient has resumed her long term Septra DS for infection suppression therapy presently. 05/21/17 rheumatology stopped Cytoxin. S/p  multiple ABT treated for left lobar pneumonia and sepsis( hospitalized 05/11/17 to 05/17/17). Her ABT stopped once her pneumonia was ruled out during hospitalization 06/05/17 to 06/08/17 for multifocal atrial tachycardia which was rate controlled after BCB started.     Family/ staff Communication: plan of care reviewed with the patient and charge nurse  Labs/tests ordered:  None  Time spend 25 minutes.

## 2017-06-14 ENCOUNTER — Encounter: Payer: Self-pay | Admitting: Nurse Practitioner

## 2017-06-14 NOTE — Assessment & Plan Note (Signed)
The patient has resumed her long term Septra DS for infection suppression therapy presently. 05/21/17 rheumatology stopped Cytoxin. S/p  multiple ABT treated for left lobar pneumonia and sepsis( hospitalized 05/11/17 to 05/17/17). Her ABT stopped once her pneumonia was ruled out during hospitalization 06/05/17 to 06/08/17 for multifocal atrial tachycardia which was rate controlled after BCB started.

## 2017-06-18 LAB — POCT INR: INR: 2 — AB (ref <=1.1)

## 2017-06-18 LAB — PROTIME-INR: Protime: 20.9 — AB (ref 10.0–13.8)

## 2017-06-19 ENCOUNTER — Other Ambulatory Visit: Payer: Self-pay | Admitting: *Deleted

## 2017-06-19 ENCOUNTER — Encounter: Payer: Self-pay | Admitting: Family

## 2017-06-19 DIAGNOSIS — D500 Iron deficiency anemia secondary to blood loss (chronic): Secondary | ICD-10-CM

## 2017-06-19 LAB — BASIC METABOLIC PANEL
BUN: 8 (ref 4–21)
Creatinine: 0.9 (ref <=1.1)
Glucose: 83
Potassium: 4.2 (ref 3.4–5.3)
SODIUM: 140 (ref 137–147)

## 2017-06-19 LAB — HEPATIC FUNCTION PANEL
ALK PHOS: 106 (ref 25–125)
ALT: 11 (ref 7–35)
AST: 20 (ref 13–35)
BILIRUBIN, TOTAL: 0.4

## 2017-06-19 LAB — CBC AND DIFFERENTIAL
HEMATOCRIT: 26 — AB (ref 36–46)
HEMOGLOBIN: 9.1 — AB (ref 12.0–16.0)
PLATELETS: 293 (ref 150–399)
WBC: 5.2

## 2017-06-21 ENCOUNTER — Other Ambulatory Visit (HOSPITAL_BASED_OUTPATIENT_CLINIC_OR_DEPARTMENT_OTHER): Payer: Medicare Other

## 2017-06-21 ENCOUNTER — Ambulatory Visit (HOSPITAL_BASED_OUTPATIENT_CLINIC_OR_DEPARTMENT_OTHER): Payer: Medicare Other | Admitting: Family

## 2017-06-21 ENCOUNTER — Encounter: Payer: Self-pay | Admitting: Family

## 2017-06-21 ENCOUNTER — Other Ambulatory Visit: Payer: Self-pay

## 2017-06-21 ENCOUNTER — Non-Acute Institutional Stay (SKILLED_NURSING_FACILITY): Payer: Medicare Other | Admitting: Internal Medicine

## 2017-06-21 ENCOUNTER — Encounter: Payer: Self-pay | Admitting: Internal Medicine

## 2017-06-21 VITALS — BP 124/78 | HR 105 | Temp 97.4°F | Resp 18 | Wt 124.0 lb

## 2017-06-21 DIAGNOSIS — G600 Hereditary motor and sensory neuropathy: Secondary | ICD-10-CM

## 2017-06-21 DIAGNOSIS — M3130 Wegener's granulomatosis without renal involvement: Secondary | ICD-10-CM | POA: Diagnosis not present

## 2017-06-21 DIAGNOSIS — D509 Iron deficiency anemia, unspecified: Secondary | ICD-10-CM

## 2017-06-21 DIAGNOSIS — D500 Iron deficiency anemia secondary to blood loss (chronic): Secondary | ICD-10-CM

## 2017-06-21 DIAGNOSIS — Z7189 Other specified counseling: Secondary | ICD-10-CM | POA: Diagnosis not present

## 2017-06-21 LAB — CMP (CANCER CENTER ONLY)
ALT: 21 U/L (ref 10–47)
AST: 28 U/L (ref 11–38)
Albumin: 3 g/dL — ABNORMAL LOW (ref 3.3–5.5)
Alkaline Phosphatase: 124 U/L — ABNORMAL HIGH (ref 26–84)
BILIRUBIN TOTAL: 0.5 mg/dL (ref 0.20–1.60)
BUN: 6 mg/dL — AB (ref 7–22)
CHLORIDE: 107 meq/L (ref 98–108)
CO2: 26 mEq/L (ref 18–33)
CREATININE: 1.1 mg/dL (ref 0.6–1.2)
Calcium: 9.9 mg/dL (ref 8.0–10.3)
Glucose, Bld: 146 mg/dL — ABNORMAL HIGH (ref 73–118)
Potassium: 4.1 mEq/L (ref 3.3–4.7)
SODIUM: 143 meq/L (ref 128–145)
TOTAL PROTEIN: 6.9 g/dL (ref 6.4–8.1)

## 2017-06-21 LAB — CBC WITH DIFFERENTIAL (CANCER CENTER ONLY)
BASO#: 0 10*3/uL (ref 0.0–0.2)
BASO%: 0.2 % (ref 0.0–2.0)
EOS%: 2.5 % (ref 0.0–7.0)
Eosinophils Absolute: 0.1 10*3/uL (ref 0.0–0.5)
HCT: 34.1 % — ABNORMAL LOW (ref 34.8–46.6)
HGB: 11.2 g/dL — ABNORMAL LOW (ref 11.6–15.9)
LYMPH#: 0.3 10*3/uL — ABNORMAL LOW (ref 0.9–3.3)
LYMPH%: 7.6 % — AB (ref 14.0–48.0)
MCH: 36.4 pg — AB (ref 26.0–34.0)
MCHC: 32.8 g/dL (ref 32.0–36.0)
MCV: 111 fL — ABNORMAL HIGH (ref 81–101)
MONO#: 0.6 10*3/uL (ref 0.1–0.9)
MONO%: 12.6 % (ref 0.0–13.0)
NEUT%: 77.1 % (ref 39.6–80.0)
NEUTROS ABS: 3.4 10*3/uL (ref 1.5–6.5)
PLATELETS: 328 10*3/uL (ref 145–400)
RBC: 3.08 10*6/uL — AB (ref 3.70–5.32)
RDW: 14.6 % (ref 11.1–15.7)
WBC: 4.4 10*3/uL (ref 3.9–10.0)

## 2017-06-21 LAB — IRON AND TIBC
%SAT: 25 % (ref 21–57)
Iron: 39 ug/dL — ABNORMAL LOW (ref 41–142)
TIBC: 153 ug/dL — ABNORMAL LOW (ref 236–444)
UIBC: 114 ug/dL — ABNORMAL LOW (ref 120–384)

## 2017-06-21 LAB — FERRITIN: FERRITIN: 513 ng/mL — AB (ref 9–269)

## 2017-06-21 NOTE — Progress Notes (Signed)
Hematology and Oncology Follow Up Visit  [name] [id number] June 25, 1933 83 y.o. 06/21/2017   Principle Diagnosis:  Iron deficiency anemia Wegener's granulomatosis  Current Therapy:   IV iron as indicated - last received inAugust 2018   Interim History:  [name] is here today with her caregiver for follow-up. She is feeling a little better. She still has some fatigue and weakness at times. Her Hgb is improved at 11.2 with an MCV of 111. Her iron is a little low at 39 so we will plan to give her IV iron tomorrow so she will feel better for Christmas.  I called [facility] to find out when she stopped the Cytoxan. This was discontinued on 05/15/2017. While on this she states that it caused her fatigue as well. Hopefully now that she is no longer taking this medication he counts will continue to improve.  She denies fever, n/v, rash, dizziness, SOB, chest pain, palpitations, abdominal pain or changes in bowel or bladder habits.  She states that she has been cold since "the day she was born." No swelling, tenderness, numbness or tingling in her extremities at this time. She is in a wheel chair today. She has had no falls or syncopal episodes.   She denies any episodes of bleeding. No lymphadenopathy found on exam.  Her appetite comes and goes. She is hydrating well. Her weight is stable.   ECOG Performance Status: 2 - Symptomatic, <50% confined to bed  Medications:  Allergies as of 06/21/2017      Reactions   Adhesive [tape] Rash   Caused a rash on lower extremities       Medication List        Accurate as of 06/21/17 11:51 AM. Always use your most recent med list.          acetaminophen 325 MG tablet Commonly known as:  TYLENOL Take 650 mg by mouth See admin instructions. 650 mg at bedtime every night and may take an additional 650 mg every 12 hours as needed for pain   CALCIUM 600-D 600-400 MG-UNIT Tabs Generic drug:  Calcium Carbonate-Vitamin D3 Take 1 tablet by  mouth daily.   diclofenac sodium 1 % Gel Commonly known as:  VOLTAREN Apply 2 g topically 3 (three) times daily. Apply to left shoulder.   Fluticasone-Salmeterol 500-50 MCG/DOSE Aepb Commonly known as:  ADVAIR DISKUS INHALE 1 PUFF EVERY 12 HOURS.   folic acid 1 MG tablet Commonly known as:  FOLVITE Take 1 mg by mouth daily.   food thickener Powd Commonly known as:  SIMPLYTHICK Take 1 packet by mouth 4 (four) times daily -  with meals and at bedtime.   gabapentin 100 MG capsule Commonly known as:  NEURONTIN Take 100 mg by mouth 3 (three) times daily.   guaiFENesin 600 MG 12 hr tablet Commonly known as:  MUCINEX Take 600 mg by mouth every 12 (twelve) hours.   guaiFENesin-dextromethorphan 100-10 MG/5ML syrup Commonly known as:  ROBITUSSIN DM Take 5 mLs every 4 (four) hours as needed by mouth for cough.   hydrocortisone cream 1 % Apply 1 application topically 4 (four) times daily as needed for itching.   ipratropium 0.02 % nebulizer solution Commonly known as:  ATROVENT Take 2.5 mLs (0.5 mg total) 3 (three) times daily by nebulization.   latanoprost 0.005 % ophthalmic solution Commonly known as:  XALATAN Place 1 drop into both eyes at bedtime.   levalbuterol 0.63 MG/3ML nebulizer solution Commonly known as:  XOPENEX Take 0.63  mg by nebulization every 6 (six) hours as needed for wheezing or shortness of breath.   LORazepam 0.5 MG tablet Commonly known as:  ATIVAN Take 1 tablet (0.5 mg total) by mouth at bedtime.   metoprolol tartrate 25 MG tablet Commonly known as:  LOPRESSOR Take 0.5 tablets (12.5 mg total) by mouth 2 (two) times daily.   NAPHCON-A 0.025-0.3 % ophthalmic solution Generic drug:  naphazoline-pheniramine Place 1 drop 3 (three) times daily into the left eye.   omeprazole 20 MG capsule Commonly known as:  PRILOSEC Take 20 mg by mouth daily.   oxybutynin 5 MG tablet Commonly known as:  DITROPAN One twice daily to help bladder control   potassium  chloride SA 20 MEQ tablet Commonly known as:  K-DUR,KLOR-CON Take 40 mEq by mouth 2 (two) times daily.   saccharomyces boulardii 250 MG capsule Commonly known as:  FLORASTOR Take 250 mg by mouth daily.   SORE THROAT SPRAY 1.4 % Liqd Generic drug:  phenol Use as directed 1 spray every 8 (eight) hours as needed in the mouth or throat for throat irritation / pain.   sulfamethoxazole-trimethoprim 800-160 MG tablet Commonly known as:  BACTRIM DS,SEPTRA DS Take 1 tablet by mouth every Monday, Wednesday, and Friday.   traZODone 50 MG tablet Commonly known as:  DESYREL Take 25 mg by mouth at bedtime as needed for sleep (STOP DATE: 06/15/17).   Vitamin D-3 1000 units Caps Take 1,000 Units by mouth daily.   warfarin 3 MG tablet Commonly known as:  COUMADIN Take 3 mg by mouth daily.       Allergies:  Allergies  Allergen Reactions  . Adhesive [Tape] Rash    Caused a rash on lower extremities     Past Medical History, Surgical history, Social history, and Family History were reviewed and updated.  Review of Systems: All other 10 point review of systems is negative.   Physical Exam:  vitals were not taken for this visit.   Wt Readings from Last 3 Encounters:  06/13/17 127 lb (57.6 kg)  06/12/17 127 lb 4.8 oz (57.7 kg)  06/08/17 127 lb 4.8 oz (57.7 kg)    Ocular: Sclerae unicteric, pupils equal, round and reactive to light Ear-nose-throat: Oropharynx clear, dentition fair Lymphatic: No cervical, supraclavicular or axillary adenopathy Lungs no rales or rhonchi, good excursion bilaterally Heart regular rate and rhythm, no murmur appreciated Abd soft, nontender, positive bowel sounds, no liver or spleen tip palpated on exam, no fluid wave  MSK no focal spinal tenderness, no joint edema Neuro: non-focal, well-oriented, appropriate affect Breasts: Deferred   Lab Results  Component Value Date   WBC 4.4 06/21/2017   HGB 11.2 (L) 06/21/2017   HCT 34.1 (L) 06/21/2017   MCV  111 (H) 06/21/2017   PLT 328 06/21/2017   Lab Results  Component Value Date   FERRITIN 604 (H) 06/05/2017   IRON 67 06/05/2017   TIBC 146 (L) 06/05/2017   UIBC 79 (L) 06/05/2017   IRONPCTSAT 46 06/05/2017   Lab Results  Component Value Date   RETICCTPCT 1.6 02/03/2015   RBC 3.08 (L) 06/21/2017   RETICCTABS 62.4 02/03/2015   No results found for: KPAFRELGTCHN, LAMBDASER, KAPLAMBRATIO No results found for: IGGSERUM, IGA, IGMSERUM No results found for: TOTALPROTELP, ALBUMINELP, A1GS, A2GS, BETS, BETA2SER, GAMS, MSPIKE, SPEI   Chemistry      Component Value Date/Time   NA 140 06/19/2017   NA 143 06/05/2017 1026   NA 137 05/10/2017 1142   K 4.2  06/19/2017   K 4.5 06/05/2017 1026   K 3.9 05/10/2017 1142   CL 110 06/08/2017 0230   CL 107 06/05/2017 1026   CO2 25 06/08/2017 0230   CO2 25 06/05/2017 1026   CO2 22 05/10/2017 1142   BUN 8 06/19/2017   BUN 7 06/05/2017 1026   BUN 12.5 05/10/2017 1142   CREATININE 0.9 06/19/2017   CREATININE 0.88 06/08/2017 0230   CREATININE 1.0 06/05/2017 1026   CREATININE 1.1 05/10/2017 1142   GLU 83 06/19/2017      Component Value Date/Time   CALCIUM 9.2 06/08/2017 0230   CALCIUM 9.6 06/05/2017 1026   CALCIUM 10.1 05/10/2017 1142   ALKPHOS 106 06/19/2017   ALKPHOS 128 (H) 06/05/2017 1026   ALKPHOS 150 05/10/2017 1142   AST 20 06/19/2017   AST 41 (H) 06/05/2017 1026   AST 33 05/10/2017 1142   ALT 11 06/19/2017   ALT 30 06/05/2017 1026   ALT 24 05/10/2017 1142   BILITOT 0.5 06/05/2017 1850   BILITOT 0.50 06/05/2017 1026   BILITOT 0.46 05/10/2017 1142      Impression and Plan: Ms. Kitzmiller is a very pleasant 83 yo caucasian female with both Wegener's granulomatosis and iron deficiency anemia.  Her Hgb has improved since stopping the Cytoxan.  Her iron is still a little low so we will give her a dose tomorrow. She is symptomatic with fatigued and weakness.  We will plan to see her back again in another 4 weeks for follow-up and repeat  lab work.  Both she and her family know to contact our office with any questions or concerns. We can certainly see her sooner if need be.   Laverna Peace, NP 12/20/201811:51 AM

## 2017-06-21 NOTE — Progress Notes (Signed)
Location:  Short Room Number: [place] of Service:  SNF [phone]) Provider:  Blanchie Serve, MD  Blanchie Serve, MD  [name], MD as PCP - General (Internal Medicine) Unice Bailey, MD as Consulting Physician (Rheumatology) Rozetta Nunnery, MD as Consulting Physician (Otolaryngology) Marcial Pacas, MD as Consulting Physician (Neurology) Michel Bickers, MD as Consulting Physician (Infectious Diseases) Marin Olp Rudell Cobb, MD as Consulting Physician (Oncology) Juanito Doom, MD as Consulting Physician (Pulmonary Disease) Tanda Rockers, MD as Consulting Physician (Pulmonary Disease) Estill Dooms, MD as Consulting Physician (Geriatric Medicine) Coralie Keens, MD as Consulting Physician (General Surgery) Mast, Man X, NP as Nurse Practitioner (Nurse Practitioner)  Extended Emergency Contact Information Primary Emergency Contact: [facility] Address: [place], [place] [address] [place] Phone: [phone] Mobile Phone: [phone] Relation: Daughter Secondary Emergency Contact: Mcglamery,[name] Address: [address]          [place], [place] [address] [place] Phone: [phone] Mobile Phone: [phone] Relation: Son  Code Status:  DNR  Goals of care: Advanced Directive information Advanced Directives 06/21/2017  Does Patient Have a Medical Advance Directive? Yes  Type of Paramedic of [place];Living will  Does patient want to make changes to medical advance directive? No - Patient declined  Copy of [place] in Chart? Yes  Would patient like information on creating a medical advance directive? -     Chief Complaint  Patient presents with  . Acute Visit    Advanced Care Plan Meeting     HPI:  Pt is a 83 y.o. female seen today for an acute visit to review goals of care with patient and  daughter. Patient is tired and sleeping more. Breathing is stable. She is working with therapy team. She was in the hospital recently with dyspnea and tachycardia. She also had dysphagia and is being followed by SLP. Denies any pain this visit. Her daughter is her HCPOA and would like to have goals of care meeting done without patient present. She has reviewed her living will and is her HCPOA.    Past Medical History:  Diagnosis Date  . Acute respiratory failure with hypoxia ([place])   . Anemia   . Aspergillosis ([place])   . Bladder incontinence   . Blindness of one eye   . Cancer ([place])    melanoma   (chemo)  . Chronic sinusitis   . Clotting disorder ([place])   . Difficult intubation    Difficult airway with intubation for ARF 07/22/14 (due to anterior larynx, also had mucous plug)  . Fatigue 12/09/2015  . Glaucoma   . Hearing loss in left ear    hearing aid both ears  . History of pulmonary embolism 1997  . Interstitial emphysema ([place])   . Left shoulder pain 12/09/2015  . Multiple allergies   . Neuropathy   . OA (osteoarthritis)   . Pneumonia 12/15  . Shortness of breath dyspnea   . Stroke [facility])    ??? blind left eye  . Wegener's granulomatosis ([place])    Past Surgical History:  Procedure Laterality Date  . bronchosocpy     pt. states she has had over 10 in last 20 yrs.  Marland Kitchen CATARACT EXTRACTION    . COCHLEAR IMPLANT Left 10/27/2015   Procedure: LEFT COCHLEAR IMPLANT;  Surgeon: Leta Baptist, MD;  Location:  [facility] OR;  Service: ENT;  Laterality: Left;  . INGUINAL HERNIA REPAIR Left 02/17/2014   Procedure: LEFT INGUINAL HERNIA REPAIR WITH MESH;  Surgeon: Harl Bowie, MD;  Location: [place];  Service: General;  Laterality: Left;  . INSERTION OF MESH N/A 02/17/2014   Procedure: INSERTION OF MESH;  Surgeon: Harl Bowie, MD;  Location: [place];  Service: General;  Laterality: N/A;  . KNEE ARTHROSCOPY  2006   rt   . MELANOMA EXCISION     left leg  .  PUBOVAGINAL SLING    . TEAR DUCT PROBING    . TUBAL LIGATION    . TYMPANOMASTOIDECTOMY Left 04/28/2015  . TYMPANOMASTOIDECTOMY Left 04/28/2015   Procedure: LEFT TYMPANOMASTOIDECTOMY;  Surgeon: Leta Baptist, MD;  Location: [place];  Service: ENT;  Laterality: Left;  Marland Kitchen VENA CAVA FILTER PLACEMENT  1997   [place] filter    Allergies  Allergen Reactions  . Adhesive [Tape] Rash    Caused a rash on lower extremities     Outpatient Encounter Medications as of 06/21/2017  Medication Sig  . acetaminophen (TYLENOL) 325 MG tablet Take 650 mg by mouth See admin instructions. 650 mg at bedtime every night and may take an additional 650 mg every 12 hours as needed for pain  . Calcium Carbonate-Vitamin D3 (CALCIUM 600-D) 600-400 MG-UNIT TABS Take 1 tablet by mouth daily.  . Cholecalciferol (VITAMIN D-3) 1000 units CAPS Take 1,000 Units by mouth daily.  . diclofenac sodium (VOLTAREN) 1 % GEL Apply 2 g topically 3 (three) times daily. Apply to left shoulder.  . Fluticasone-Salmeterol (ADVAIR DISKUS) 500-50 MCG/DOSE AEPB INHALE 1 PUFF EVERY 12 HOURS.  . folic acid (FOLVITE) 1 MG tablet Take 1 mg by mouth daily.   . food thickener (SIMPLYTHICK) POWD Take 1 packet by mouth 4 (four) times daily -  with meals and at bedtime.  . gabapentin (NEURONTIN) 100 MG capsule Take 100 mg by mouth 3 (three) times daily.  Marland Kitchen guaiFENesin (MUCINEX) 600 MG 12 hr tablet Take 600 mg by mouth every 12 (twelve) hours.   Marland Kitchen guaiFENesin-dextromethorphan (ROBITUSSIN DM) 100-10 MG/5ML syrup Take 5 mLs every 4 (four) hours as needed by mouth for cough.  . hydrocortisone cream 1 % Apply 1 application topically 4 (four) times daily as needed for itching.   Marland Kitchen ipratropium (ATROVENT) 0.02 % nebulizer solution Take 2.5 mLs (0.5 mg total) 3 (three) times daily by nebulization.  Marland Kitchen latanoprost (XALATAN) 0.005 % ophthalmic solution Place 1 drop into both eyes at bedtime.   . levalbuterol (XOPENEX) 0.63 MG/3ML nebulizer solution Take 0.63 mg by  nebulization every 6 (six) hours as needed for wheezing or shortness of breath.  Marland Kitchen LORazepam (ATIVAN) 0.5 MG tablet Take 1 tablet (0.5 mg total) by mouth at bedtime.  . metoprolol tartrate (LOPRESSOR) 25 MG tablet Take 0.5 tablets (12.5 mg total) by mouth 2 (two) times daily.  . naphazoline-pheniramine (NAPHCON-A) 0.025-0.3 % ophthalmic solution Place 1 drop 3 (three) times daily into the left eye.   . Nutritional Supplements (RESOURCE 2.0 PO) Take 120 mLs by mouth daily.  Marland Kitchen omeprazole (PRILOSEC) 20 MG capsule Take 20 mg by mouth daily.  Marland Kitchen oxybutynin (DITROPAN) 5 MG tablet One twice daily to help bladder control  . OXYGEN Inhale 2 L into the lungs as needed (to maintain 90% o2).  . phenol (SORE THROAT SPRAY) 1.4 % LIQD Use as directed 1 spray every 8 (eight) hours as needed in the mouth or throat  for throat irritation / pain.   . potassium chloride SA (K-DUR,KLOR-CON) 20 MEQ tablet Take 40 mEq by mouth 2 (two) times daily.  Marland Kitchen saccharomyces boulardii (FLORASTOR) 250 MG capsule Take 250 mg by mouth daily.   Marland Kitchen sulfamethoxazole-trimethoprim (BACTRIM DS,SEPTRA DS) 800-160 MG tablet Take 1 tablet by mouth every Monday, Wednesday, and Friday.   . warfarin (COUMADIN) 2.5 MG tablet Take 2.5 mg by mouth daily.  . [DISCONTINUED] traZODone (DESYREL) 50 MG tablet Take 25 mg by mouth at bedtime as needed for sleep (STOP DATE: 06/15/17).   . [DISCONTINUED] warfarin (COUMADIN) 3 MG tablet Take 3 mg by mouth daily.   No facility-administered encounter medications on file as of 06/21/2017.     Review of Systems  Constitutional: Positive for fatigue. Negative for diaphoresis and fever.  HENT: Positive for hearing loss.   Respiratory: Positive for shortness of breath. Negative for cough.   Cardiovascular: Negative for chest pain and palpitations.  Genitourinary: Negative for dysuria.  Neurological: Positive for weakness. Negative for headaches.  Psychiatric/Behavioral: Positive for confusion.     Immunization History  Administered Date(s) Administered  . Influenza Split 03/04/2012, 03/11/2014, 04/01/2015  . Influenza,inj,Quad PF,6+ Mos 03/03/2013  . Influenza-Unspecified 04/01/2015, 04/19/2016, 04/23/2017  . PPD Test 05/25/2015  . Pneumococcal Conjugate-13 07/01/2015  . Pneumococcal Polysaccharide-23 09/02/2015  . Zoster 09/30/1994   Pertinent  Health Maintenance Due  Topic Date Due  . INFLUENZA VACCINE  Completed  . DEXA SCAN  Completed  . PNA vac Low Risk Adult  Completed   Fall Risk  03/20/2017 12/09/2015 09/23/2015 09/09/2015 02/03/2015  Falls in the past year? No No Yes Yes Yes  Number falls in past yr: - - 2 or more 2 or more 2 or more  Comment - - - - -  Injury with Fall? - - No No -   Functional Status Survey:    Vitals:   06/21/17 1355  BP: 110/66  Pulse: 95  Resp: 18  Temp: 98.6 F (37 C)  TempSrc: Oral  SpO2: 93%  Weight: 126 lb (57.2 kg)  Height: 5\' 1"  (1.549 m)   Body mass index is 23.81 kg/m. Physical Exam  Constitutional: She appears well-developed and well-nourished. No distress.  HENT:  Head: Normocephalic and atraumatic.  Mouth/Throat: Oropharynx is clear and moist.  Eyes: Right eye exhibits no discharge. Left eye exhibits no discharge.  Neck: Neck supple.  Cardiovascular: Normal rate and regular rhythm.  Pulmonary/Chest: Effort normal. She has rales.  Lymphadenopathy:    She has no cervical adenopathy.  Skin: She is not diaphoretic.    Labs reviewed: Recent Labs    06/05/17 1850 06/08/17 0230 06/19/17 06/21/17 1105  NA 137 139 140 143  K 4.2 3.9 4.2 4.1  CL 107 110  --  107  CO2 23 25  --  26  GLUCOSE 119* 92  --  146*  BUN 5* 6 8 6*  CREATININE 0.91 0.88 0.9 1.1  CALCIUM 9.7 9.2  --  9.9   Recent Labs    06/05/17 1026 06/05/17 1850 06/19/17 06/21/17 1105  AST 41* 39 20 28  ALT 30 23 11 21   ALKPHOS 128* 129* 106 124*  BILITOT 0.50 0.5  --  0.50  PROT 6.1* 6.0*  --  6.9  ALBUMIN 2.8* 3.0*  --  3.0*   Recent Labs     06/05/17 1850 06/08/17 0230 06/12/17 06/19/17 06/21/17 1105  WBC 7.4 4.5 2.55 5.2 4.4  NEUTROABS 5.7 2.7  --   --  3.4  HGB 10.4* 8.5* 9.0 9.1* 11.2*  HCT 32.6* 27.0* 25.7 26* 34.1*  MCV 109.0* 110.7*  --   --  111*  PLT 317 261  --  293 328   Lab Results  Component Value Date   TSH 1.160 06/06/2017   Lab Results  Component Value Date   HGBA1C 5.8 (H) 07/28/2014   No results found for: CHOL, HDL, LDLCALC, LDLDIRECT, TRIG, CHOLHDL  Significant Diagnostic Results in last 30 days:  Dg Chest 2 View  Result Date: 06/07/2017 CLINICAL DATA:  Pneumonia.  Productive cough. EXAM: CHEST  2 VIEW COMPARISON:  CT 06/06/2017.  Chest x-ray 06/05/2017 . FINDINGS: Mediastinum and hilar structures normal. Heart size normal. Left base atelectasis/ infiltrate with left-sided pleural effusion noted. Low lung volumes. Stable left apical bullous changes. No pneumothorax. Thoracic spine scoliosis. IMPRESSION: 1. Left base atelectasis/infiltrate and left-sided pleural effusion again noted. No significant change from prior CT. 2. Stable left apical bullous changes again noted. Electronically Signed   By: Marcello Moores  Register   On: 06/07/2017 08:21   Dg Chest 2 View  Result Date: 06/05/2017 CLINICAL DATA:  Shortness of breath. EXAM: CHEST  2 VIEW COMPARISON:  Radiographs of May 14, 2017. FINDINGS: Stable cardiomediastinal silhouette. Atherosclerosis of thoracic aorta is noted. No pneumothorax is noted. Right lung is clear. Mild left pleural effusion is noted with adjacent atelectasis or infiltrate. This is slightly improved compared to prior exam. Bony thorax unremarkable. IMPRESSION: Aortic atherosclerosis. Slightly improved left pleural effusion with adjacent atelectasis or infiltrate. Electronically Signed   By: Marijo Conception, M.D.   On: 06/05/2017 21:53   Ct Angio Chest Pe W/cm &/or Wo Cm  Result Date: 06/06/2017 CLINICAL DATA:  83 year old female with tachycardia and hypoxia. History of PE and DVT.  EXAM: CT ANGIOGRAPHY CHEST WITH CONTRAST TECHNIQUE: Multidetector CT imaging of the chest was performed using the standard protocol during bolus administration of intravenous contrast. Multiplanar CT image reconstructions and MIPs were obtained to evaluate the vascular anatomy. CONTRAST:  116mL ISOVUE-370 IOPAMIDOL (ISOVUE-370) INJECTION 76% COMPARISON:  Chest radiograph dated 06/05/2017 and CT dated 10/16/2016 FINDINGS: Cardiovascular: There is no cardiomegaly or pericardial effusion. There is moderate atherosclerotic calcification of the thoracic aorta. No aneurysmal dilatation or evidence of dissection. The origins of the great vessels of the aortic arch appear patent. There is no CT evidence of pulmonary embolism. Mediastinum/Nodes: No hilar adenopathy. Esophagus is grossly unremarkable as visualized. No mediastinal fluid collection. Lungs/Pleura: Left apical subpleural bulla appears similar to prior CT. There is a moderate-sized left pleural effusion, new since the prior CT. There is consolidative changes of the left lower lobe with areas of nodularity. This may be related to compressive atelectasis but concerning for superimposed infection. Clinical correlation is recommended. Right infrahilar hazy density may be combination of atelectasis and scarring. Areas of nodular scarring is seen in the right middle lobe. There is no pneumothorax. The central airways are patent. Upper Abdomen: No acute abnormality. Musculoskeletal: Degenerative changes of the spine. No acute osseous pathology. Review of the MIP images confirms the above findings. IMPRESSION: 1. No CT evidence of pulmonary embolism. 2. Moderate left pleural effusion with associated partial compressive atelectasis of left lower lobe. Areas of nodularity in the left lower lobe concerning for superimposed infection. Clinical correlation is recommended. 3. Left apical subpleural bulla similar to prior CT. 4. Right infrahilar atelectasis/scarring. 5.  Aortic  Atherosclerosis (ICD10-I70.0). Electronically Signed   By: Anner Crete M.D.   On: 06/06/2017 05:17  Dg Swallowing Func-speech Pathology  Result Date: 06/08/2017 Objective Swallowing Evaluation: Type of Study: MBS-Modified Barium Swallow Study  Patient Details Name: [name] MRN: [id number] Date of Birth: 09/06/1932 Today's Date: 06/08/2017 Time: SLP Start Time (ACUTE ONLY): 0935 -SLP Stop Time (ACUTE ONLY): 1005 SLP Time Calculation (min) (ACUTE ONLY): 30 min Past Medical History: Past Medical History: Diagnosis Date . Acute respiratory failure with hypoxia ([place])  . Anemia  . Aspergillosis ([place])  . Bladder incontinence  . Blindness of one eye  . Cancer ([place])   melanoma   (chemo) . Chronic sinusitis  . Clotting disorder ([place])  . Difficult intubation   Difficult airway with intubation for ARF 07/22/14 (due to anterior larynx, also had mucous plug) . Fatigue 12/09/2015 . Glaucoma  . Hearing loss in left ear   hearing aid both ears . History of pulmonary embolism 1997 . Interstitial emphysema ([place])  . Left shoulder pain 12/09/2015 . Multiple allergies  . Neuropathy  . OA (osteoarthritis)  . Pneumonia 12/15 . Shortness of breath dyspnea  . Stroke [facility])   ??? blind left eye . Wegener's granulomatosis ([place])  Past Surgical History: Past Surgical History: Procedure Laterality Date . bronchosocpy    pt. states she has had over 10 in last 20 yrs. Marland Kitchen CATARACT EXTRACTION   . COCHLEAR IMPLANT Left 10/27/2015  Procedure: LEFT COCHLEAR IMPLANT;  Surgeon: Leta Baptist, MD;  Location: [place];  Service: ENT;  Laterality: Left; . INGUINAL HERNIA REPAIR Left 02/17/2014  Procedure: LEFT INGUINAL HERNIA REPAIR WITH MESH;  Surgeon: Harl Bowie, MD;  Location: [place];  Service: General;  Laterality: Left; . INSERTION OF MESH N/A 02/17/2014  Procedure: INSERTION OF MESH;  Surgeon: Harl Bowie, MD;  Location: [place];  Service: General;  Laterality: N/A; . KNEE ARTHROSCOPY  2006  rt  . MELANOMA  EXCISION    left leg . PUBOVAGINAL SLING   . TEAR DUCT PROBING   . TUBAL LIGATION   . TYMPANOMASTOIDECTOMY Left 04/28/2015 . TYMPANOMASTOIDECTOMY Left 04/28/2015  Procedure: LEFT TYMPANOMASTOIDECTOMY;  Surgeon: Leta Baptist, MD;  Location: [place];  Service: ENT;  Laterality: Left; Marland Kitchen VENA CAVA FILTER PLACEMENT  1997  [place] filter HPI: 83 year old female admitted 06/05/17 with SOB. PMH significant for COPD, PE, DVT, microcytic anemia, peripheral neuropathy, sepsis due to PNA (05/2017), deafness. CXR = LLL atelectasis/infiltrate, left pleural effusion  Subjective: Pt seen in radiology for MBS. No family present Assessment / Plan / Recommendation CHL IP CLINICAL IMPRESSIONS 06/08/2017 Clinical Impression Pt presents with Mild pharyngeal dysphagia, with both motor and sensory issues present. Across consistencies, the swallow reflex was delayed, with trigger noted at the level of the pyriform on thin, nectar thick, and puree consistencies. Solid texture triggered at the level of the vallecular sinus. Pt exhibited SILENT ASPIRATION of thin liquids, which occurred before, during, and after the swallow with cup or straw use. Nectar thick liquid via straw resulted in penetration during the swallow, but penetrate was cleared and not aspirated.  No penetration of nectar thick liquids via cup sip. Minimal post-swallow residue was noted after the swallow across consistencies. At this time, recommend regular consistency solids with nectar thick liquids, NO STRAWS. Given plan for pt to discharge today, recommend outpatient or homehealth speech therapy to follow up with patient for home exercise program and dysphagia therapy. PT was informed of tested results in writing, given difficulty hearing.  MD also informed of results and recommendations. Safe swallow precautions were  sent to pt room with transport.   SLP Visit Diagnosis Dysphagia, pharyngeal phase (R13.13) Impact on safety and function Mild aspiration risk;Moderate aspiration  risk   CHL IP TREATMENT RECOMMENDATION 06/08/2017 Treatment Recommendations Therapy as outlined in treatment plan below   Prognosis 06/08/2017 Prognosis for Safe Diet Advancement Good CHL IP DIET RECOMMENDATION 06/08/2017 SLP Diet Recommendations Regular solids;Nectar thick liquid Liquid Administration via Cup;No straw Medication Administration Whole meds with liquid Compensations Slow rate;Small sips/bites;Minimize environmental distractions Postural Changes Remain semi-upright after after feeds/meals (Comment);Seated upright at 90 degrees   CHL IP OTHER RECOMMENDATIONS 06/08/2017 Oral Care Recommendations Oral care QID Other Recommendations Order thickener from pharmacy;Clarify dietary restrictions   CHL IP FOLLOW UP RECOMMENDATIONS 06/08/2017 Follow up Recommendations Home health SLP;Outpatient SLP   CHL IP FREQUENCY AND DURATION 06/08/2017 Speech Therapy Frequency (ACUTE ONLY) min 1 x/week Treatment Duration 1 week;2 weeks      CHL IP ORAL PHASE 06/08/2017 Oral Phase WFL  CHL IP PHARYNGEAL PHASE 06/08/2017 Pharyngeal Phase Impaired Pharyngeal- Nectar Cup Delayed swallow initiation-vallecula Pharyngeal- Nectar Straw Delayed swallow initiation-vallecula;Penetration/Aspiration during swallow;Delayed swallow initiation-pyriform sinuses Pharyngeal Material enters airway, remains ABOVE vocal cords and not ejected out Pharyngeal- Thin Cup Delayed swallow initiation-pyriform sinuses;Penetration/Aspiration during swallow;Penetration/Aspiration before swallow Pharyngeal Material enters airway, passes BELOW cords without attempt by patient to eject out (silent aspiration) Pharyngeal- Thin Straw Delayed swallow initiation-pyriform sinuses;Penetration/Apiration after swallow;Penetration/Aspiration during swallow Pharyngeal Material enters airway, passes BELOW cords without attempt by patient to eject out (silent aspiration) Pharyngeal- Puree Delayed swallow initiation-pyriform sinuses Pharyngeal- Regular Delayed swallow  initiation-vallecula  CHL IP CERVICAL ESOPHAGEAL PHASE 06/08/2017 Cervical Esophageal Phase [facility] Celia B. Quentin [facility], CCC-SLP Speech Language Pathologist [phone] Shonna Chock 06/08/2017, 10:49 AM               Assessment/Plan  Goals of care discussion Reviewed goals of care with patient's daughter with social worker present. She would like a Do Not Resuscitate for her mother in absence of pulse or breath. She is unsure if mother should be hospitalized if medical emergencies arise. She would like to discuss this further with her mother and husband. She would like to wait and see if patient would recover with therapy. Goal is for pt to transfer to assisted living. I explained to family that patient is a high risk for hospitalization with her wegner's granulomatosis, iron deficiency anemia, deconditioning and recent hospitalization with HCAP, pleural effusion and her dysphagia among others. She is a high aspiration risk. Daughter understands this and would discuss further with her mother/ patient and family. She is not sure about use of antibiotic and iv fluids. She does not want feeding tube for patient. Reviewed these from 2:10 pm- 2:45 pm. At present a DNR form will be signed and placed in chart.  Peripheral neuropathy Currently on gabapentin 100 mg tid. Denies numbness or tingling with worsening symptoms. Decrease this to 100 mg bid and monitor with goal to help improve her mentation and alertness.      Family/ staff Communication: reviewed care plan with patient's daughter and charge nurse.    Labs/tests ordered:  none  Blanchie Serve, MD Internal Medicine [facility] [address] [place],  [address] Cell Phone (Monday-Friday 8 am - 5 pm): [phone] On Call: [phone] and follow prompts after 5 pm and on weekends Office Phone: [phone] Office Fax: [phone]

## 2017-06-22 ENCOUNTER — Ambulatory Visit (HOSPITAL_BASED_OUTPATIENT_CLINIC_OR_DEPARTMENT_OTHER): Payer: Medicare Other

## 2017-06-22 VITALS — BP 108/41 | HR 69 | Temp 98.3°F | Resp 20

## 2017-06-22 DIAGNOSIS — D638 Anemia in other chronic diseases classified elsewhere: Secondary | ICD-10-CM

## 2017-06-22 DIAGNOSIS — D509 Iron deficiency anemia, unspecified: Secondary | ICD-10-CM | POA: Diagnosis not present

## 2017-06-22 LAB — RETICULOCYTES: Reticulocyte Count: 2.9 % — ABNORMAL HIGH (ref 0.6–2.6)

## 2017-06-22 MED ORDER — SODIUM CHLORIDE 0.9 % IV SOLN
510.0000 mg | Freq: Once | INTRAVENOUS | Status: AC
  Administered 2017-06-22: 510 mg via INTRAVENOUS
  Filled 2017-06-22: qty 17

## 2017-06-22 MED ORDER — SODIUM CHLORIDE 0.9 % IV SOLN
Freq: Once | INTRAVENOUS | Status: AC
  Administered 2017-06-22: 10:00:00 via INTRAVENOUS

## 2017-06-22 NOTE — Patient Instructions (Signed)

## 2017-06-27 ENCOUNTER — Encounter: Payer: Self-pay | Admitting: Nurse Practitioner

## 2017-06-27 ENCOUNTER — Encounter: Payer: Self-pay | Admitting: Internal Medicine

## 2017-06-27 ENCOUNTER — Non-Acute Institutional Stay (SKILLED_NURSING_FACILITY): Payer: Medicare Other | Admitting: Nurse Practitioner

## 2017-06-27 DIAGNOSIS — Z7901 Long term (current) use of anticoagulants: Secondary | ICD-10-CM

## 2017-06-27 DIAGNOSIS — D500 Iron deficiency anemia secondary to blood loss (chronic): Secondary | ICD-10-CM

## 2017-06-27 DIAGNOSIS — D649 Anemia, unspecified: Secondary | ICD-10-CM | POA: Diagnosis not present

## 2017-06-27 DIAGNOSIS — R042 Hemoptysis: Secondary | ICD-10-CM | POA: Insufficient documentation

## 2017-06-27 DIAGNOSIS — R531 Weakness: Secondary | ICD-10-CM | POA: Diagnosis not present

## 2017-06-27 DIAGNOSIS — M3130 Wegener's granulomatosis without renal involvement: Secondary | ICD-10-CM | POA: Diagnosis not present

## 2017-06-27 LAB — POCT INR: INR: 4.8 — AB (ref 0.9–1.1)

## 2017-06-27 NOTE — Assessment & Plan Note (Signed)
Family reported "rust" colored sputum 06/27/17, the patient and staff stated her sputum was grayish colored. Will continue to observe the patient. Update CBC/diff, may consider CXR if symptom persists.

## 2017-06-27 NOTE — Assessment & Plan Note (Signed)
c/o rust colored phlegm per family, the patient and staff stated the color of sputum is grayish, the patient stated she feels tired the same as in the past a few days, she is up for hair appointment today. Her Coumadin is on hold for elevated PT/INR: 06/27/17 INR 4.8, 06/26/17 INR 6.5, 06/25/17 INR 6.2. She denied chest pain, palpitation, or SOB. She is afebrile, no O2 desaturation. She has history of iron infusion per hematology, last infusion 12/21/8.

## 2017-06-27 NOTE — Assessment & Plan Note (Signed)
Multiple factorials, chronic anemia, recent treated PNA, resolved tachycardia, and protein calorie malnutrition. The patient is up for hair appointment, she stated her fatigue is as same as yesterday, will update CBC/diff, CMP

## 2017-06-27 NOTE — Progress Notes (Signed)
Location:  [place] Room Number: 17 Place of Service:  SNF ([phone]) Provider:  Kiffany Schelling, [place]xie  NP  Blanchie Serve, MD  [name], MD as PCP - General (Internal Medicine) Unice Bailey, MD as Consulting Physician (Rheumatology) Rozetta Nunnery, MD as Consulting Physician (Otolaryngology) Marcial Pacas, MD as Consulting Physician (Neurology) Michel Bickers, MD as Consulting Physician (Infectious Diseases) Marin Olp Rudell Cobb, MD as Consulting Physician (Oncology) Juanito Doom, MD as Consulting Physician (Pulmonary Disease) Tanda Rockers, MD as Consulting Physician (Pulmonary Disease) Estill Dooms, MD as Consulting Physician (Geriatric Medicine) Coralie Keens, MD as Consulting Physician (General Surgery) Alastair Hennes X, NP as Nurse Practitioner (Nurse Practitioner)  Extended Emergency Contact Information Primary Emergency Contact: [name] Address: [place], [place] [address] Johnnette Litter of [place] Phone: [phone] Mobile Phone: [phone] Relation: Daughter Secondary Emergency Contact: [name],[name] Address: [address] [place]          [place], [name] [address] [place] Phone: [phone] Mobile Phone: [phone] Relation: Son  Code Status:  Full Code Goals of care: Advanced Directive information Advanced Directives 06/27/2017  Does Patient Have a Medical Advance Directive? Yes  Type of Paramedic of [place];Living will  Does patient want to make changes to medical advance directive? No - Patient declined  Copy of [place] in Chart? Yes  Would patient like information on creating a medical advance directive? -     Chief Complaint  Patient presents with  . Acute Visit    Elevated PT/INR    HPI:  Pt is a 83 y.o. female seen today for an acute visit for c/o rust colored phlegm per family, the patient  and staff stated the color of sputum is grayish today, the patient stated she feels tired the same as in the past a few days, she is up for hair appointment today. Her Coumadin is on hold for elevated PT/INR: 06/27/17 INR 4.8, 06/26/17 INR 6.5, 06/25/17 INR 6.2. She denied chest pain, palpitation, or SOB. She is afebrile, no O2 desaturation.    Hx of anemia, requires iron infusion, last infusion 06/22/17. Hx of Wegeners Granulomatosis, chronic cough, on Septra DS 3x/week for infection suppression therapy.    Past Medical History:  Diagnosis Date  . Acute respiratory failure with hypoxia ([name])   . Anemia   . Aspergillosis ([place])   . Bladder incontinence   . Blindness of one eye   . Cancer ([name])    melanoma   (chemo)  . Chronic sinusitis   . Clotting disorder ([place])   . Difficult intubation    Difficult airway with intubation for ARF 07/22/14 (due to anterior larynx, also had mucous plug)  . Fatigue 12/09/2015  . Glaucoma   . Hearing loss in left ear    hearing aid both ears  . History of pulmonary embolism 1997  . Interstitial emphysema ([place])   . Left shoulder pain 12/09/2015  . Multiple allergies   . Neuropathy   . OA (osteoarthritis)   . Pneumonia 12/15  . Shortness of breath dyspnea   . Stroke [name])    ??? blind left eye  . Wegener's granulomatosis ([place])    Past Surgical History:  Procedure Laterality Date  . bronchosocpy     pt. states she has had over 10 in last 20 yrs.  Marland Kitchen CATARACT EXTRACTION    .  COCHLEAR IMPLANT Left 10/27/2015   Procedure: LEFT COCHLEAR IMPLANT;  Surgeon: Leta Baptist, MD;  Location: [place];  Service: ENT;  Laterality: Left;  . INGUINAL HERNIA REPAIR Left 02/17/2014   Procedure: LEFT INGUINAL HERNIA REPAIR WITH MESH;  Surgeon: Harl Bowie, MD;  Location: [place];  Service: General;  Laterality: Left;  . INSERTION OF MESH N/A 02/17/2014   Procedure: INSERTION OF MESH;  Surgeon: Harl Bowie, MD;  Location: [place];   Service: General;  Laterality: N/A;  . KNEE ARTHROSCOPY  2006   rt   . MELANOMA EXCISION     left leg  . PUBOVAGINAL SLING    . TEAR DUCT PROBING    . TUBAL LIGATION    . TYMPANOMASTOIDECTOMY Left 04/28/2015  . TYMPANOMASTOIDECTOMY Left 04/28/2015   Procedure: LEFT TYMPANOMASTOIDECTOMY;  Surgeon: Leta Baptist, MD;  Location: [place];  Service: ENT;  Laterality: Left;  Marland Kitchen VENA CAVA FILTER PLACEMENT  1997   [place] filter    Allergies  Allergen Reactions  . Adhesive [Tape] Rash    Caused a rash on lower extremities     Outpatient Encounter Medications as of 06/27/2017  Medication Sig  . acetaminophen (TYLENOL) 325 MG tablet Take 650 mg by mouth See admin instructions. 650 mg at bedtime every night and may take an additional 650 mg every 12 hours as needed for pain  . Calcium Carbonate-Vitamin D3 (CALCIUM 600-D) 600-400 MG-UNIT TABS Take 1 tablet by mouth daily.  . Cholecalciferol (VITAMIN D-3) 1000 units CAPS Take 1,000 Units by mouth daily.  . diclofenac sodium (VOLTAREN) 1 % GEL Apply 2 g topically 3 (three) times daily. Apply to left shoulder.  . Fluticasone-Salmeterol (ADVAIR DISKUS) 500-50 MCG/DOSE AEPB INHALE 1 PUFF EVERY 12 HOURS.  . folic acid (FOLVITE) 1 MG tablet Take 1 mg by mouth daily.   . food thickener (SIMPLYTHICK) POWD Take 1 packet by mouth 4 (four) times daily -  with meals and at bedtime.  . gabapentin (NEURONTIN) 100 MG capsule Take 100 mg by mouth 3 (three) times daily.  Marland Kitchen guaiFENesin (MUCINEX) 600 MG 12 hr tablet Take 600 mg by mouth every 12 (twelve) hours.   Marland Kitchen guaiFENesin-dextromethorphan (ROBITUSSIN DM) 100-10 MG/5ML syrup Take 5 mLs every 4 (four) hours as needed by mouth for cough.  . hydrocortisone cream 1 % Apply 1 application topically 4 (four) times daily as needed for itching.   Marland Kitchen ipratropium (ATROVENT) 0.02 % nebulizer solution Take 2.5 mLs (0.5 mg total) 3 (three) times daily by nebulization.  Marland Kitchen latanoprost (XALATAN) 0.005 % ophthalmic solution Place 1  drop into both eyes at bedtime.   . levalbuterol (XOPENEX) 0.63 MG/3ML nebulizer solution Take 0.63 mg by nebulization every 6 (six) hours as needed for wheezing or shortness of breath.  Marland Kitchen LORazepam (ATIVAN) 0.5 MG tablet Take 1 tablet (0.5 mg total) by mouth at bedtime.  . metoprolol tartrate (LOPRESSOR) 25 MG tablet Take 0.5 tablets (12.5 mg total) by mouth 2 (two) times daily.  . naphazoline-pheniramine (NAPHCON-A) 0.025-0.3 % ophthalmic solution Place 1 drop 3 (three) times daily into the left eye.   . Nutritional Supplements (RESOURCE 2.0 PO) Take 120 mLs by mouth daily.  Marland Kitchen omeprazole (PRILOSEC) 20 MG capsule Take 20 mg by mouth daily.  Marland Kitchen oxybutynin (DITROPAN) 5 MG tablet One twice daily to help bladder control  . OXYGEN Inhale 2 L into the lungs as needed (to maintain 90% o2).  . phenol (SORE THROAT SPRAY) 1.4 %  LIQD Use as directed 1 spray every 8 (eight) hours as needed in the mouth or throat for throat irritation / pain.   . potassium chloride SA (K-DUR,KLOR-CON) 20 MEQ tablet Take 40 mEq by mouth 2 (two) times daily.  Marland Kitchen saccharomyces boulardii (FLORASTOR) 250 MG capsule Take 250 mg by mouth daily.   Marland Kitchen sulfamethoxazole-trimethoprim (BACTRIM DS,SEPTRA DS) 800-160 MG tablet Take 1 tablet by mouth every Monday, Wednesday, and Friday.   . warfarin (COUMADIN) 2.5 MG tablet Take 2.5 mg by mouth daily.   No facility-administered encounter medications on file as of 06/27/2017.    ROS was provided with assistance with family and staff Review of Systems  Constitutional: Positive for fatigue. Negative for activity change, appetite change, chills, diaphoresis and fever.  HENT: Positive for hearing loss. Negative for congestion, trouble swallowing and voice change.   Respiratory: Positive for cough. Negative for choking, chest tightness, shortness of breath and wheezing.   Cardiovascular: Positive for leg swelling. Negative for chest pain and palpitations.  Gastrointestinal: Negative for abdominal  distention, abdominal pain, constipation, diarrhea, nausea and vomiting.  Genitourinary: Negative for difficulty urinating, dysuria, frequency and urgency.  Musculoskeletal: Positive for gait problem.  Skin: Positive for color change. Negative for pallor, rash and wound.       Back of left and right hand bruises. Left shin hematoma.   Neurological: Negative for tremors, speech difficulty, weakness and headaches.  Psychiatric/Behavioral: Positive for confusion. Negative for agitation, behavioral problems, hallucinations and sleep disturbance. The patient is not nervous/anxious.     Immunization History  Administered Date(s) Administered  . Influenza Split 03/04/2012, 03/11/2014, 04/01/2015  . Influenza,inj,Quad PF,6+ Mos 03/03/2013  . Influenza-Unspecified 04/01/2015, 04/19/2016, 04/23/2017  . PPD Test 05/25/2015  . Pneumococcal Conjugate-13 07/01/2015  . Pneumococcal Polysaccharide-23 09/02/2015  . Zoster 09/30/1994   Pertinent  Health Maintenance Due  Topic Date Due  . INFLUENZA VACCINE  Completed  . DEXA SCAN  Completed  . PNA vac Low Risk Adult  Completed   Fall Risk  03/20/2017 12/09/2015 09/23/2015 09/09/2015 02/03/2015  Falls in the past year? No No Yes Yes Yes  Number falls in past yr: - - 2 or more 2 or more 2 or more  Comment - - - - -  Injury with Fall? - - No No -   Functional Status Survey:    Vitals:   06/27/17 1436  BP: 138/72  Pulse: 100  Resp: 18  Temp: 98.9 F (37.2 C)  SpO2: 95%  Weight: 122 lb 8 oz (55.6 kg)  Height: 5\' 1"  (1.549 m)   Body mass index is 23.15 kg/m. Physical Exam  Constitutional: She appears well-developed and well-nourished.  HENT:  Head: Normocephalic and atraumatic.  Eyes: Conjunctivae and EOM are normal. Pupils are equal, round, and reactive to light.  Neck: Normal range of motion. Neck supple. No JVD present. No thyromegaly present.  Cardiovascular: Normal rate, regular rhythm and normal heart sounds.  No murmur  heard. Pulmonary/Chest: Effort normal and breath sounds normal. No respiratory distress. She has no wheezes. She has no rales. She exhibits no tenderness.  Scattered posterior mid to lower rales.   Abdominal: Soft. Bowel sounds are normal. She exhibits no distension. There is no tenderness.  Musculoskeletal: Normal range of motion. She exhibits edema. She exhibits no tenderness.  Trace edema in ankles. SBA for transfer.   Neurological: She is alert. She exhibits normal muscle tone. Coordination normal.  Oriented to person and place  Skin: Skin is warm and dry.  Bruises dorsum of the R+L hands. A golf ball sized left shin hematoma intact  Psychiatric: She has a normal mood and affect. Her behavior is normal. Judgment and thought content normal.    Labs reviewed: Recent Labs    06/05/17 1850 06/08/17 0230 06/19/17 06/21/17 1105  NA 137 139 140 143  K 4.2 3.9 4.2 4.1  CL 107 110  --  107  CO2 23 25  --  26  GLUCOSE 119* 92  --  146*  BUN 5* 6 8 6*  CREATININE 0.91 0.88 0.9 1.1  CALCIUM 9.7 9.2  --  9.9   Recent Labs    06/05/17 1026 06/05/17 1850 06/19/17 06/21/17 1105  AST 41* 39 20 28  ALT 30 23 11 21   ALKPHOS 128* 129* 106 124*  BILITOT 0.50 0.5  --  0.50  PROT 6.1* 6.0*  --  6.9  ALBUMIN 2.8* 3.0*  --  3.0*   Recent Labs    06/05/17 1850 06/08/17 0230 06/12/17 06/19/17 06/21/17 1105  WBC 7.4 4.5 2.55 5.2 4.4  NEUTROABS 5.7 2.7  --   --  3.4  HGB 10.4* 8.5* 9.0 9.1* 11.2*  HCT 32.6* 27.0* 25.7 26* 34.1*  MCV 109.0* 110.7*  --   --  111*  PLT 317 261  --  293 328   Lab Results  Component Value Date   TSH 1.160 06/06/2017   Lab Results  Component Value Date   HGBA1C 5.8 (H) 07/28/2014   No results found for: CHOL, HDL, LDLCALC, LDLDIRECT, TRIG, CHOLHDL  Significant Diagnostic Results in last 30 days:  Dg Chest 2 View  Result Date: 06/07/2017 CLINICAL DATA:  Pneumonia.  Productive cough. EXAM: CHEST  2 VIEW COMPARISON:  CT 06/06/2017.  Chest x-ray  06/05/2017 . FINDINGS: Mediastinum and hilar structures normal. Heart size normal. Left base atelectasis/ infiltrate with left-sided pleural effusion noted. Low lung volumes. Stable left apical bullous changes. No pneumothorax. Thoracic spine scoliosis. IMPRESSION: 1. Left base atelectasis/infiltrate and left-sided pleural effusion again noted. No significant change from prior CT. 2. Stable left apical bullous changes again noted. Electronically Signed   By: Marcello Moores  Register   On: 06/07/2017 08:21   Dg Chest 2 View  Result Date: 06/05/2017 CLINICAL DATA:  Shortness of breath. EXAM: CHEST  2 VIEW COMPARISON:  Radiographs of May 14, 2017. FINDINGS: Stable cardiomediastinal silhouette. Atherosclerosis of thoracic aorta is noted. No pneumothorax is noted. Right lung is clear. Mild left pleural effusion is noted with adjacent atelectasis or infiltrate. This is slightly improved compared to prior exam. Bony thorax unremarkable. IMPRESSION: Aortic atherosclerosis. Slightly improved left pleural effusion with adjacent atelectasis or infiltrate. Electronically Signed   By: Marijo Conception, M.D.   On: 06/05/2017 21:53   Ct Angio Chest Pe W/cm &/or Wo Cm  Result Date: 06/06/2017 CLINICAL DATA:  83 year old female with tachycardia and hypoxia. History of PE and DVT. EXAM: CT ANGIOGRAPHY CHEST WITH CONTRAST TECHNIQUE: Multidetector CT imaging of the chest was performed using the standard protocol during bolus administration of intravenous contrast. Multiplanar CT image reconstructions and MIPs were obtained to evaluate the vascular anatomy. CONTRAST:  152mL ISOVUE-370 IOPAMIDOL (ISOVUE-370) INJECTION 76% COMPARISON:  Chest radiograph dated 06/05/2017 and CT dated 10/16/2016 FINDINGS: Cardiovascular: There is no cardiomegaly or pericardial effusion. There is moderate atherosclerotic calcification of the thoracic aorta. No aneurysmal dilatation or evidence of dissection. The origins of the great vessels of the aortic  arch appear patent. There is no CT evidence of pulmonary  embolism. Mediastinum/Nodes: No hilar adenopathy. Esophagus is grossly unremarkable as visualized. No mediastinal fluid collection. Lungs/Pleura: Left apical subpleural bulla appears similar to prior CT. There is a moderate-sized left pleural effusion, new since the prior CT. There is consolidative changes of the left lower lobe with areas of nodularity. This may be related to compressive atelectasis but concerning for superimposed infection. Clinical correlation is recommended. Right infrahilar hazy density may be combination of atelectasis and scarring. Areas of nodular scarring is seen in the right middle lobe. There is no pneumothorax. The central airways are patent. Upper Abdomen: No acute abnormality. Musculoskeletal: Degenerative changes of the spine. No acute osseous pathology. Review of the MIP images confirms the above findings. IMPRESSION: 1. No CT evidence of pulmonary embolism. 2. Moderate left pleural effusion with associated partial compressive atelectasis of left lower lobe. Areas of nodularity in the left lower lobe concerning for superimposed infection. Clinical correlation is recommended. 3. Left apical subpleural bulla similar to prior CT. 4. Right infrahilar atelectasis/scarring. 5.  Aortic Atherosclerosis (ICD10-I70.0). Electronically Signed   By: Anner Crete M.D.   On: 06/06/2017 05:17   Dg Swallowing Func-speech Pathology  Result Date: 06/08/2017 Objective Swallowing Evaluation: Type of Study: MBS-Modified Barium Swallow Study  Patient Details Name: [name] MRN: [id number] Date of Birth: 1932-08-12 Today's Date: 06/08/2017 Time: SLP Start Time (ACUTE ONLY): 0935 -SLP Stop Time (ACUTE ONLY): 1005 SLP Time Calculation (min) (ACUTE ONLY): 30 min Past Medical History: Past Medical History: Diagnosis Date . Acute respiratory failure with hypoxia ([place])  . Anemia  . Aspergillosis ([place])  . Bladder incontinence  . Blindness of one  eye  . Cancer ([place])   melanoma   ([place]mo) . Chronic sinusitis  . Clotting disorder ([place])  . Difficult intubation   Difficult airway with intubation for ARF 07/22/14 (due to anterior larynx, also had mucous plug) . Fatigue 12/09/2015 . Glaucoma  . Hearing loss in left ear   hearing aid both ears . History of pulmonary embolism 1997 . Interstitial emphysema ([place])  . Left shoulder pain 12/09/2015 . Multiple allergies  . Neuropathy  . OA (osteoarthritis)  . Pneumonia 12/15 . Shortness of breath dyspnea  . Stroke [facility])   ??? blind left eye . Wegener's granulomatosis ([place])  Past Surgical History: Past Surgical History: Procedure Laterality Date . bronchosocpy    pt. states she has had over 10 in last 20 yrs. Marland Kitchen CATARACT EXTRACTION   . COCHLEAR IMPLANT Left 10/27/2015  Procedure: LEFT COCHLEAR IMPLANT;  Surgeon: Leta Baptist, MD;  Location: [place];  Service: ENT;  Laterality: Left; . INGUINAL HERNIA REPAIR Left 02/17/2014  Procedure: LEFT INGUINAL HERNIA REPAIR WITH MESH;  Surgeon: Harl Bowie, MD;  Location: [place];  Service: General;  Laterality: Left; . INSERTION OF MESH N/A 02/17/2014  Procedure: INSERTION OF MESH;  Surgeon: Harl Bowie, MD;  Location: [place];  Service: General;  Laterality: N/A; . KNEE ARTHROSCOPY  2006  rt  . MELANOMA EXCISION    left leg . PUBOVAGINAL SLING   . TEAR DUCT PROBING   . TUBAL LIGATION   . TYMPANOMASTOIDECTOMY Left 04/28/2015 . TYMPANOMASTOIDECTOMY Left 04/28/2015  Procedure: LEFT TYMPANOMASTOIDECTOMY;  Surgeon: Leta Baptist, MD;  Location: [place];  Service: ENT;  Laterality: Left; Marland Kitchen VENA CAVA FILTER PLACEMENT  1997  [place] filter HPI: 83 year old female admitted 06/05/17 with SOB. PMH significant for COPD, PE, DVT, microcytic anemia, peripheral neuropathy, sepsis due to PNA (05/2017), deafness. CXR =  LLL atelectasis/infiltrate, left pleural effusion  Subjective: Pt seen in radiology for MBS. No family present Assessment / Plan / Recommendation CHL IP  CLINICAL IMPRESSIONS 06/08/2017 Clinical Impression Pt presents with Mild pharyngeal dysphagia, with both motor and sensory issues present. Across consistencies, the swallow reflex was delayed, with trigger noted at the level of the pyriform on thin, nectar thick, and puree consistencies. Solid texture triggered at the level of the vallecular sinus. Pt exhibited SILENT ASPIRATION of thin liquids, which occurred before, during, and after the swallow with cup or straw use. Nectar thick liquid via straw resulted in penetration during the swallow, but penetrate was cleared and not aspirated.  No penetration of nectar thick liquids via cup sip. Minimal post-swallow residue was noted after the swallow across consistencies. At this time, recommend regular consistency solids with nectar thick liquids, NO STRAWS. Given plan for pt to discharge today, recommend outpatient or homehealth speech therapy to follow up with patient for home exercise program and dysphagia therapy. PT was informed of tested results in writing, given difficulty hearing.  MD also informed of results and recommendations. Safe swallow precautions were sent to pt room with transport.   SLP Visit Diagnosis Dysphagia, pharyngeal phase (R13.13) Impact on safety and function Mild aspiration risk;Moderate aspiration risk   CHL IP TREATMENT RECOMMENDATION 06/08/2017 Treatment Recommendations Therapy as outlined in treatment plan below   Prognosis 06/08/2017 Prognosis for Safe Diet Advancement Good CHL IP DIET RECOMMENDATION 06/08/2017 SLP Diet Recommendations Regular solids;Nectar thick liquid Liquid Administration via Cup;No straw Medication Administration Whole meds with liquid Compensations Slow rate;Small sips/bites;Minimize environmental distractions Postural Changes Remain semi-upright after after feeds/meals (Comment);Seated upright at 90 degrees   CHL IP OTHER RECOMMENDATIONS 06/08/2017 Oral Care Recommendations Oral care QID Other Recommendations Order  thickener from pharmacy;Clarify dietary restrictions   CHL IP FOLLOW UP RECOMMENDATIONS 06/08/2017 Follow up Recommendations Home health SLP;Outpatient SLP   CHL IP FREQUENCY AND DURATION 06/08/2017 Speech Therapy Frequency (ACUTE ONLY) min 1 x/week Treatment Duration 1 week;2 weeks      CHL IP ORAL PHASE 06/08/2017 Oral Phase WFL  CHL IP PHARYNGEAL PHASE 06/08/2017 Pharyngeal Phase Impaired Pharyngeal- Nectar Cup Delayed swallow initiation-vallecula Pharyngeal- Nectar Straw Delayed swallow initiation-vallecula;Penetration/Aspiration during swallow;Delayed swallow initiation-pyriform sinuses Pharyngeal Material enters airway, remains ABOVE vocal cords and not ejected out Pharyngeal- Thin Cup Delayed swallow initiation-pyriform sinuses;Penetration/Aspiration during swallow;Penetration/Aspiration before swallow Pharyngeal Material enters airway, passes BELOW cords without attempt by patient to eject out (silent aspiration) Pharyngeal- Thin Straw Delayed swallow initiation-pyriform sinuses;Penetration/Apiration after swallow;Penetration/Aspiration during swallow Pharyngeal Material enters airway, passes BELOW cords without attempt by patient to eject out (silent aspiration) Pharyngeal- Puree Delayed swallow initiation-pyriform sinuses Pharyngeal- Regular Delayed swallow initiation-vallecula  CHL IP CERVICAL ESOPHAGEAL PHASE 06/08/2017 Cervical Esophageal Phase [facility] Celia B. Quentin O[facility], CCC-SLP Speech Language Pathologist [phone] Shonna Chock 06/08/2017, 10:49 AM               Assessment/Plan Long term current use of anticoagulant therapy Hx of PE, chronic RLE DVT, currently supra therapeutic anticoagulation. Her Coumadin is on hold for elevated PT/INR: 06/27/17 INR 4.8, 06/26/17 INR 6.5, 06/25/17 INR 6.2. Will update CBC  in setting of hematoma, bruises, reported rust phlegm production.     Chronic anemia c/o rust colored phlegm per family, the patient and staff stated the color of sputum is grayish, the  patient stated she feels tired the same as in the past a few days, she is up for hair appointment today. Her Coumadin is on hold for  elevated PT/INR: 06/27/17 INR 4.8, 06/26/17 INR 6.5, 06/25/17 INR 6.2. She denied chest pain, palpitation, or SOB. She is afebrile, no O2 desaturation. She has history of iron infusion per hematology, last infusion 12/21/8.     Iron deficiency anemia due to chronic blood loss F/u hematology, last iron infusion 06/22/17, will update CBC in setting of hematoma, bruises, fatigue, supra therapeutic INR, and c/o rust phlegm production  WEGENERS GRANULOMATOSIS 05/21/17 Rheumatology stopped Cytoxin. Septra DS 3x/week for infection suppression therapy.   Generalized weakness Multiple factorials, chronic anemia, recent treated PNA, resolved tachycardia, and protein calorie malnutrition. The patient is up for hair appointment, she stated her fatigue is as same as yesterday, will update CBC/diff, CMP  Blood-tinged sputum Family reported "rust" colored sputum 06/27/17, the patient and staff stated her sputum was grayish colored. Will continue to observe the patient. Update CBC/diff, may consider CXR if symptom persists.      Family/ staff Communication: plan of care reviewed with the patient, the patient's POA/daughter, and charge nurse  Labs/tests ordered:  PT/INR 06/29/17, CBC/diff, CMP  Time spend 25 minutes.

## 2017-06-27 NOTE — Assessment & Plan Note (Addendum)
F/u hematology, last iron infusion 06/22/17, will update CBC in setting of hematoma, bruises, fatigue, supra therapeutic INR, and c/o rust phlegm production

## 2017-06-27 NOTE — Assessment & Plan Note (Signed)
Hx of PE, chronic RLE DVT, currently supra therapeutic anticoagulation. Her Coumadin is on hold for elevated PT/INR: 06/27/17 INR 4.8, 06/26/17 INR 6.5, 06/25/17 INR 6.2. Will update CBC  in setting of hematoma, bruises, reported rust phlegm production.

## 2017-06-27 NOTE — Assessment & Plan Note (Signed)
05/21/17 Rheumatology stopped Cytoxin. Septra DS 3x/week for infection suppression therapy.

## 2017-06-28 LAB — HEPATIC FUNCTION PANEL
ALT: 10 (ref 7–35)
AST: 23 (ref 13–35)
Alkaline Phosphatase: 131 — AB (ref 25–125)
BILIRUBIN, TOTAL: 0.4

## 2017-06-28 LAB — BASIC METABOLIC PANEL
BUN: 8 (ref 4–21)
Creatinine: 0.9 (ref 0.5–1.1)
GLUCOSE: 111
Potassium: 4.6 (ref 3.4–5.3)
Sodium: 142 (ref 137–147)

## 2017-06-28 LAB — CBC AND DIFFERENTIAL
HEMATOCRIT: 32 — AB (ref 36–46)
HEMOGLOBIN: 10.6 — AB (ref 12.0–16.0)
PLATELETS: 349 (ref 150–399)
WBC: 5.6

## 2017-07-05 ENCOUNTER — Encounter: Payer: Self-pay | Admitting: Internal Medicine

## 2017-07-06 ENCOUNTER — Encounter: Payer: Self-pay | Admitting: Internal Medicine

## 2017-07-06 ENCOUNTER — Non-Acute Institutional Stay (SKILLED_NURSING_FACILITY): Payer: Medicare Other | Admitting: Internal Medicine

## 2017-07-06 DIAGNOSIS — R5382 Chronic fatigue, unspecified: Secondary | ICD-10-CM

## 2017-07-06 DIAGNOSIS — G5793 Unspecified mononeuropathy of bilateral lower limbs: Secondary | ICD-10-CM

## 2017-07-06 DIAGNOSIS — J9611 Chronic respiratory failure with hypoxia: Secondary | ICD-10-CM

## 2017-07-06 DIAGNOSIS — R05 Cough: Secondary | ICD-10-CM | POA: Diagnosis not present

## 2017-07-06 DIAGNOSIS — R682 Dry mouth, unspecified: Secondary | ICD-10-CM

## 2017-07-06 DIAGNOSIS — R059 Cough, unspecified: Secondary | ICD-10-CM

## 2017-07-06 NOTE — Progress Notes (Signed)
Location:  [place] Room Number: [place] of Service:  SNF [phone]) Provider:  Blanchie Serve, MD  Blanchie Serve, MD  [name], MD as PCP - General (Internal Medicine) Unice Bailey, MD as Consulting Physician (Rheumatology) Rozetta Nunnery, MD as Consulting Physician (Otolaryngology) Marcial Pacas, MD as Consulting Physician (Neurology) Michel Bickers, MD as Consulting Physician (Infectious Diseases) Marin Olp Rudell Cobb, MD as Consulting Physician (Oncology) Juanito Doom, MD as Consulting Physician (Pulmonary Disease) Tanda Rockers, MD as Consulting Physician (Pulmonary Disease) Estill Dooms, MD as Consulting Physician (Geriatric Medicine) Coralie Keens, MD as Consulting Physician (General Surgery) Mast, Man X, NP as Nurse Practitioner (Nurse Practitioner)  Extended Emergency Contact Information Primary Emergency Contact: [facility] Address: [place], [place] [address] Johnnette Litter of [place] Phone: [phone] Mobile Phone: [phone] Relation: Daughter Secondary Emergency Contact: Allbee,[name] Address: [address] [place]          [place], [place] [address] [place] Phone: [phone] Mobile Phone: [phone] Relation: Son  Code Status:  DNR  Goals of care: Advanced Directive information Advanced Directives 07/06/2017  Does Patient Have a Medical Advance Directive? Yes  Type of Paramedic of [place];Living will;Out of facility DNR (pink MOST or yellow form)  Does patient want to make changes to medical advance directive? No - Patient declined  Copy of [place] in Chart? Yes  Would patient like information on creating a medical advance directive? -  Pre-existing out of facility DNR order (yellow form or pink MOST form) Yellow form placed in chart (order not valid for inpatient use);Pink MOST form placed  in chart (order not valid for inpatient use)     Chief Complaint  Patient presents with  . Acute Visit    family concerns    HPI:  Pt is a 84 y.o. female seen today for an acute visit for family concerns. She has ongoing weakness. Family is concerned about her fatigue, oral hygiene, management of humidifier and cough. Of note, cough has been a chronic issue. Per pt no worsening of cough reported. Nurses have not reported increased frequency of cough or increased production of sputum. Pt is afebrile. She brings up sputum with yellow color. Denies frank blood in it. Denies chest pain. She complaints of her mouth being sore and painful. Denies open sores to mouth. She feels that she gets tired easily. She has history of wegners granulomatosis and iron deficiency anemia. She has also been in the hospital back to back with pneumonia, pleural effusion and multifocal atrial tachycardia. She has chronic respiratory failure and now dysphagia.    Past Medical History:  Diagnosis Date  . Acute respiratory failure with hypoxia ([place])   . Anemia   . Aspergillosis ([place])   . Bladder incontinence   . Blindness of one eye   . Cancer ([place])    melanoma   (chemo)  . Chronic sinusitis   . Clotting disorder ([place])   . Difficult intubation    Difficult airway with intubation for ARF 07/22/14 (due to anterior larynx, also had mucous plug)  . Fatigue 12/09/2015  . Glaucoma   . Hearing loss in left ear    hearing aid both ears  . History of pulmonary embolism 1997  . Interstitial emphysema ([place])   . Left shoulder pain 12/09/2015  . Multiple allergies   . Neuropathy   .  OA (osteoarthritis)   . Pneumonia 12/15  . Shortness of breath dyspnea   . Stroke [facility])    ??? blind left eye  . Wegener's granulomatosis ([place])    Past Surgical History:  Procedure Laterality Date  . bronchosocpy     pt. states she has had over 10 in last 20 yrs.  Marland Kitchen CATARACT EXTRACTION    . COCHLEAR IMPLANT Left 10/27/2015   Procedure:  LEFT COCHLEAR IMPLANT;  Surgeon: Leta Baptist, MD;  Location: [facility];  Service: ENT;  Laterality: Left;  . INGUINAL HERNIA REPAIR Left 02/17/2014   Procedure: LEFT INGUINAL HERNIA REPAIR WITH MESH;  Surgeon: Harl Bowie, MD;  Location: [place];  Service: General;  Laterality: Left;  . INSERTION OF MESH N/A 02/17/2014   Procedure: INSERTION OF MESH;  Surgeon: Harl Bowie, MD;  Location: [place];  Service: General;  Laterality: N/A;  . KNEE ARTHROSCOPY  2006   rt   . MELANOMA EXCISION     left leg  . PUBOVAGINAL SLING    . TEAR DUCT PROBING    . TUBAL LIGATION    . TYMPANOMASTOIDECTOMY Left 04/28/2015  . TYMPANOMASTOIDECTOMY Left 04/28/2015   Procedure: LEFT TYMPANOMASTOIDECTOMY;  Surgeon: Leta Baptist, MD;  Location: [place];  Service: ENT;  Laterality: Left;  Marland Kitchen VENA CAVA FILTER PLACEMENT  1997   [place] filter    Allergies  Allergen Reactions  . Adhesive [Tape] Rash    Caused a rash on lower extremities     Outpatient Encounter Medications as of 07/06/2017  Medication Sig  . acetaminophen (TYLENOL) 325 MG tablet Take 650 mg by mouth See admin instructions. 650 mg at bedtime every night and may take an additional 650 mg every 12 hours as needed for pain  . Calcium Carbonate-Vitamin D3 (CALCIUM 600-D) 600-400 MG-UNIT TABS Take 1 tablet by mouth daily.  . Cholecalciferol (VITAMIN D-3) 1000 units CAPS Take 1,000 Units by mouth daily.  . diclofenac sodium (VOLTAREN) 1 % GEL Apply 2 g topically 3 (three) times daily. Apply to left shoulder.  . Fluticasone-Salmeterol (ADVAIR DISKUS) 500-50 MCG/DOSE AEPB INHALE 1 PUFF EVERY 12 HOURS.  . folic acid (FOLVITE) 1 MG tablet Take 1 mg by mouth daily.   . food thickener (SIMPLYTHICK) POWD Take 1 packet by mouth 4 (four) times daily -  with meals and at bedtime.  . gabapentin (NEURONTIN) 100 MG capsule Take 100 mg by mouth 2 (two) times daily.   Marland Kitchen guaiFENesin (MUCINEX) 600 MG 12 hr tablet Take 600 mg by mouth  every 12 (twelve) hours.   Marland Kitchen guaiFENesin-dextromethorphan (ROBITUSSIN DM) 100-10 MG/5ML syrup Take 5 mLs every 4 (four) hours as needed by mouth for cough.  . hydrocortisone cream 1 % Apply 1 application topically 4 (four) times daily as needed for itching.   Marland Kitchen ipratropium (ATROVENT) 0.02 % nebulizer solution Take 2.5 mLs (0.5 mg total) 3 (three) times daily by nebulization.  Marland Kitchen latanoprost (XALATAN) 0.005 % ophthalmic solution Place 1 drop into both eyes at bedtime.   . levalbuterol (XOPENEX) 0.63 MG/3ML nebulizer solution Take 0.63 mg by nebulization every 6 (six) hours as needed for wheezing or shortness of breath.  Marland Kitchen LORazepam (ATIVAN) 0.5 MG tablet Take 1 tablet (0.5 mg total) by mouth at bedtime.  . metoprolol tartrate (LOPRESSOR) 25 MG tablet Take 0.5 tablets (12.5 mg total) by mouth 2 (two) times daily.  . naphazoline-pheniramine (NAPHCON-A) 0.025-0.3 % ophthalmic solution Place 1 drop 3 (three) times daily into  the left eye.   . nystatin (NYSTATIN) powder Apply topically 2 (two) times daily.  Marland Kitchen omeprazole (PRILOSEC) 20 MG capsule Take 20 mg by mouth daily.  Marland Kitchen oxybutynin (DITROPAN) 5 MG tablet One twice daily to help bladder control  . OXYGEN Inhale 2 L into the lungs as needed (to maintain 90% o2).  . phenol (SORE THROAT SPRAY) 1.4 % LIQD Use as directed 1 spray every 8 (eight) hours as needed in the mouth or throat for throat irritation / pain.   . potassium chloride SA (K-DUR,KLOR-CON) 20 MEQ tablet Take 40 mEq by mouth 2 (two) times daily.  Marland Kitchen saccharomyces boulardii (FLORASTOR) 250 MG capsule Take 250 mg by mouth daily.   Marland Kitchen sulfamethoxazole-trimethoprim (BACTRIM DS,SEPTRA DS) 800-160 MG tablet Take 1 tablet by mouth every Monday, Wednesday, and Friday.   . warfarin (COUMADIN) 2.5 MG tablet Take 2.5 mg by mouth every other day.   . [DISCONTINUED] Nutritional Supplements (RESOURCE 2.0 PO) Take 120 mLs by mouth daily.   No facility-administered encounter medications on file as of  07/06/2017.     Review of Systems  Constitutional: Positive for fatigue. Negative for chills, diaphoresis and fever.  HENT: Positive for congestion, hearing loss, mouth sores, trouble swallowing and voice change. Negative for ear pain, sinus pressure, sinus pain and sore throat.        Has hearing aid  Eyes: Positive for visual disturbance.  Respiratory: Positive for cough and shortness of breath. Negative for wheezing.   Cardiovascular: Negative for chest pain and palpitations.  Gastrointestinal: Negative for abdominal pain, diarrhea, nausea and vomiting.  Endocrine: Positive for cold intolerance.  Genitourinary: Negative for dysuria.  Musculoskeletal: Positive for gait problem. Negative for joint swelling.  Skin: Negative for rash.  Neurological: Positive for weakness. Negative for dizziness and headaches.  Hematological: Bruises/bleeds easily.  Psychiatric/Behavioral: Positive for confusion. Negative for behavioral problems.    Immunization History  Administered Date(s) Administered  . Influenza Split 03/04/2012, 03/11/2014, 04/01/2015  . Influenza,inj,Quad PF,6+ Mos 03/03/2013  . Influenza-Unspecified 04/01/2015, 04/19/2016, 04/23/2017  . PPD Test 05/25/2015  . Pneumococcal Conjugate-13 07/01/2015  . Pneumococcal Polysaccharide-23 09/02/2015  . Zoster 09/30/1994   Pertinent  Health Maintenance Due  Topic Date Due  . INFLUENZA VACCINE  Completed  . DEXA SCAN  Completed  . PNA vac Low Risk Adult  Completed   Fall Risk  03/20/2017 12/09/2015 09/23/2015 09/09/2015 02/03/2015  Falls in the past year? No No Yes Yes Yes  Number falls in past yr: - - 2 or more 2 or more 2 or more  Comment - - - - -  Injury with Fall? - - No No -   Functional Status Survey:    Vitals:   07/06/17 1257  BP: 120/62  Pulse: 76  Resp: 20  Temp: (!) 96.8 F (36 C)  TempSrc: Oral  SpO2: 97%  Weight: 119 lb 12.8 oz (54.3 kg)  Height: 5\' 1"  (1.549 m)   Body mass index is 22.64 kg/m.   Wt Readings  from Last 3 Encounters:  07/06/17 119 lb 12.8 oz (54.3 kg)  06/27/17 122 lb 8 oz (55.6 kg)  06/21/17 126 lb (57.2 kg)    Physical Exam  Constitutional: No distress.  Chronically ill appearing, frail female.   HENT:  Head: Normocephalic and atraumatic.  Mouth/Throat: Oropharynx is clear and moist.  No oral thrush, no exudate  Eyes: Conjunctivae are normal. Pupils are equal, round, and reactive to light. Right eye exhibits no discharge. Left eye exhibits  no discharge.  Neck: Normal range of motion. Neck supple.  Cardiovascular: Normal rate and regular rhythm.  Pulmonary/Chest: Effort normal. No respiratory distress.  Poor air movement to lung bases, rhonchi present, no wheezing  Abdominal: Soft. Bowel sounds are normal. There is no tenderness. There is no guarding.  Musculoskeletal: She exhibits edema and deformity.  Trace leg edema, arthritis changes to fingers, unsteady gait, using motorized scooter to ambulate  Lymphadenopathy:    She has no cervical adenopathy.  Neurological: She is alert.  Oriented to self, place and time  Skin: Skin is warm and dry. She is not diaphoretic.  Psychiatric: She has a normal mood and affect.    Labs reviewed: Recent Labs    06/05/17 1850 06/08/17 0230 06/19/17 06/21/17 1105  NA 137 139 140 143  K 4.2 3.9 4.2 4.1  CL 107 110  --  107  CO2 23 25  --  26  GLUCOSE 119* 92  --  146*  BUN 5* 6 8 6*  CREATININE 0.91 0.88 0.9 1.1  CALCIUM 9.7 9.2  --  9.9   Recent Labs    06/05/17 1026 06/05/17 1850 06/19/17 06/21/17 1105  AST 41* 39 20 28  ALT 30 23 11 21   ALKPHOS 128* 129* 106 124*  BILITOT 0.50 0.5  --  0.50  PROT 6.1* 6.0*  --  6.9  ALBUMIN 2.8* 3.0*  --  3.0*   Recent Labs    06/05/17 1850 06/08/17 0230 06/12/17 06/19/17 06/21/17 1105  WBC 7.4 4.5 2.55 5.2 4.4  NEUTROABS 5.7 2.7  --   --  3.4  HGB 10.4* 8.5* 9.0 9.1* 11.2*  HCT 32.6* 27.0* 25.7 26* 34.1*  MCV 109.0* 110.7*  --   --  111*  PLT 317 261  --  293 328   Lab  Results  Component Value Date   TSH 1.160 06/06/2017   Lab Results  Component Value Date   HGBA1C 5.8 (H) 07/28/2014   No results found for: CHOL, HDL, LDLCALC, LDLDIRECT, TRIG, CHOLHDL  Significant Diagnostic Results in last 30 days:  Dg Chest 2 View  Result Date: 06/07/2017 CLINICAL DATA:  Pneumonia.  Productive cough. EXAM: CHEST  2 VIEW COMPARISON:  CT 06/06/2017.  Chest x-ray 06/05/2017 . FINDINGS: Mediastinum and hilar structures normal. Heart size normal. Left base atelectasis/ infiltrate with left-sided pleural effusion noted. Low lung volumes. Stable left apical bullous changes. No pneumothorax. Thoracic spine scoliosis. IMPRESSION: 1. Left base atelectasis/infiltrate and left-sided pleural effusion again noted. No significant change from prior CT. 2. Stable left apical bullous changes again noted. Electronically Signed   By: Marcello Moores  Register   On: 06/07/2017 08:21   Dg Swallowing Func-speech Pathology  Result Date: 06/08/2017 Objective Swallowing Evaluation: Type of Study: MBS-Modified Barium Swallow Study  Patient Details Name: LEANNY MOECKEL MRN: [id number] Date of Birth: September 09, 1932 Today's Date: 06/08/2017 Time: SLP Start Time (ACUTE ONLY): 0935 -SLP Stop Time (ACUTE ONLY): 1005 SLP Time Calculation (min) (ACUTE ONLY): 30 min Past Medical History: Past Medical History: Diagnosis Date . Acute respiratory failure with hypoxia ([place])  . Anemia  . Aspergillosis ([place])  . Bladder incontinence  . Blindness of one eye  . Cancer ([place])   melanoma   (chemo) . Chronic sinusitis  . Clotting disorder ([place])  . Difficult intubation   Difficult airway with intubation for ARF 07/22/14 (due to anterior larynx, also had mucous plug) . Fatigue 12/09/2015 . Glaucoma  . Hearing loss in left ear   hearing  aid both ears . History of pulmonary embolism 1997 . Interstitial emphysema ([place])  . Left shoulder pain 12/09/2015 . Multiple allergies  . Neuropathy  . OA (osteoarthritis)  . Pneumonia 12/15 . Shortness of breath  dyspnea  . Stroke [facility])   ??? blind left eye . Wegener's granulomatosis ([place])  Past Surgical History: Past Surgical History: Procedure Laterality Date . bronchosocpy    pt. states she has had over 10 in last 20 yrs. Marland Kitchen CATARACT EXTRACTION   . COCHLEAR IMPLANT Left 10/27/2015  Procedure: LEFT COCHLEAR IMPLANT;  Surgeon: Leta Baptist, MD;  Location: [place];  Service: ENT;  Laterality: Left; . INGUINAL HERNIA REPAIR Left 02/17/2014  Procedure: LEFT INGUINAL HERNIA REPAIR WITH MESH;  Surgeon: Harl Bowie, MD;  Location: [facility];  Service: General;  Laterality: Left; . INSERTION OF MESH N/A 02/17/2014  Procedure: INSERTION OF MESH;  Surgeon: Harl Bowie, MD;  Location: [place];  Service: General;  Laterality: N/A; . KNEE ARTHROSCOPY  2006  rt  . MELANOMA EXCISION    left leg . PUBOVAGINAL SLING   . TEAR DUCT PROBING   . TUBAL LIGATION   . TYMPANOMASTOIDECTOMY Left 04/28/2015 . TYMPANOMASTOIDECTOMY Left 04/28/2015  Procedure: LEFT TYMPANOMASTOIDECTOMY;  Surgeon: Leta Baptist, MD;  Location: [place];  Service: ENT;  Laterality: Left; Marland Kitchen VENA CAVA FILTER PLACEMENT  1997  [place] filter HPI: 84 year old female admitted 06/05/17 with SOB. PMH significant for COPD, PE, DVT, microcytic anemia, peripheral neuropathy, sepsis due to PNA (05/2017), deafness. CXR = LLL atelectasis/infiltrate, left pleural effusion  Subjective: Pt seen in radiology for MBS. No family present Assessment / Plan / Recommendation CHL IP CLINICAL IMPRESSIONS 06/08/2017 Clinical Impression Pt presents with Mild pharyngeal dysphagia, with both motor and sensory issues present. Across consistencies, the swallow reflex was delayed, with trigger noted at the level of the pyriform on thin, nectar thick, and puree consistencies. Solid texture triggered at the level of the vallecular sinus. Pt exhibited SILENT ASPIRATION of thin liquids, which occurred before, during, and after the swallow with cup or straw use. Nectar thick  liquid via straw resulted in penetration during the swallow, but penetrate was cleared and not aspirated.  No penetration of nectar thick liquids via cup sip. Minimal post-swallow residue was noted after the swallow across consistencies. At this time, recommend regular consistency solids with nectar thick liquids, NO STRAWS. Given plan for pt to discharge today, recommend outpatient or homehealth speech therapy to follow up with patient for home exercise program and dysphagia therapy. PT was informed of tested results in writing, given difficulty hearing.  MD also informed of results and recommendations. Safe swallow precautions were sent to pt room with transport.   SLP Visit Diagnosis Dysphagia, pharyngeal phase (R13.13) Impact on safety and function Mild aspiration risk;Moderate aspiration risk   CHL IP TREATMENT RECOMMENDATION 06/08/2017 Treatment Recommendations Therapy as outlined in treatment plan below   Prognosis 06/08/2017 Prognosis for Safe Diet Advancement Good CHL IP DIET RECOMMENDATION 06/08/2017 SLP Diet Recommendations Regular solids;Nectar thick liquid Liquid Administration via Cup;No straw Medication Administration Whole meds with liquid Compensations Slow rate;Small sips/bites;Minimize environmental distractions Postural Changes Remain semi-upright after after feeds/meals (Comment);Seated upright at 90 degrees   CHL IP OTHER RECOMMENDATIONS 06/08/2017 Oral Care Recommendations Oral care QID Other Recommendations Order thickener from pharmacy;Clarify dietary restrictions   CHL IP FOLLOW UP RECOMMENDATIONS 06/08/2017 Follow up Recommendations Home health SLP;Outpatient SLP   CHL IP FREQUENCY AND DURATION 06/08/2017 Speech Therapy Frequency (ACUTE  ONLY) min 1 x/week Treatment Duration 1 week;2 weeks      CHL IP ORAL PHASE 06/08/2017 Oral Phase WFL  CHL IP PHARYNGEAL PHASE 06/08/2017 Pharyngeal Phase Impaired Pharyngeal- Nectar Cup Delayed swallow initiation-vallecula Pharyngeal- Nectar Straw Delayed swallow  initiation-vallecula;Penetration/Aspiration during swallow;Delayed swallow initiation-pyriform sinuses Pharyngeal Material enters airway, remains ABOVE vocal cords and not ejected out Pharyngeal- Thin Cup Delayed swallow initiation-pyriform sinuses;Penetration/Aspiration during swallow;Penetration/Aspiration before swallow Pharyngeal Material enters airway, passes BELOW cords without attempt by patient to eject out (silent aspiration) Pharyngeal- Thin Straw Delayed swallow initiation-pyriform sinuses;Penetration/Apiration after swallow;Penetration/Aspiration during swallow Pharyngeal Material enters airway, passes BELOW cords without attempt by patient to eject out (silent aspiration) Pharyngeal- Puree Delayed swallow initiation-pyriform sinuses Pharyngeal- Regular Delayed swallow initiation-vallecula  CHL IP CERVICAL ESOPHAGEAL PHASE 06/08/2017 Cervical Esophageal Phase [facility] Celia B. Quentin O[facility], CCC-SLP Speech Language Pathologist [phone] Shonna Chock 06/08/2017, 10:49 AM               Assessment/Plan  Dry mouth Reviewed medication list. She is on oxybutynin that can cause dry mouth. Decrease oxybutynin to 5 mg daily for a week and discontinue this. Also to Wean her off gabapentin as below. No findings suggestive of infection on exam. Oral hygiene for now and biotene mouth wash.   Cough With her history of asthma and wegners. Also recently had pneumonia and remains a high risk for aspiration.thus, likely multifactorial. Continue symptomatic management and aspiration precautions. Maintaining oxygenation well on room air at present.   Fatigue Appears chronic. There had been concern that her cytoxan could be contributing to this and she is now off this medication. Cbc with diff and cmp reviewed, nothing concerning for infection. At present this is likely multifactorial with her deconditioning, wegners, iron deficiency and chronic respiratory failure. Reviewed care plan with family, mentioning that  this will likely be an ongoing complaint. Supportive care along with therapy. Changes to oxybutynin and gabapentin as mentioned  Peripheral neuropathy Currently on gabapentin 100 mg bid. Patient denies any numbness or tingling this visit. Her neuropathy is thought to be secondary to wegners. Family is concerned if this medication could be contributing to her fatigue and dry mouth and would like her off it. This medication can cause these symptoms as side effects. Decrease gabapentin to 100 mg daily for 1 week and then every other day for 1 week and discontinue it.   Chronic respiratory failure Continue advair and levalbuterol prn. Oral hygiene. o2 as needed. If has symptoms of acute worsening, obtain chest xray.      Family/ staff Communication: reviewed care plan with patient and charge nurse. Spoke with patient's son in law in detail over the phone, Dr Jerolyn Center. I have spent more that 15 minutes on the phone reviewing care plan and answering questions. He agrees with the care plan. He will get in touch with patient's rheumatologist to get clarification on prednisone for maintenance therapy- pt currently not on any but per visit note from 11/18, pt to take 40 mg daily. Also to obtain clarification on initiation of methotrexate.   Labs/tests ordered:  none  Blanchie Serve, MD Internal Medicine [facility] [address] [place], [place] [address] Cell Phone (Monday-Friday 8 am - 5 pm): [phone] On Call: [phone] and follow prompts after 5 pm and on weekends Office Phone: [phone] Office Fax: [phone]

## 2017-07-11 ENCOUNTER — Encounter: Payer: Self-pay | Admitting: Internal Medicine

## 2017-07-11 NOTE — Telephone Encounter (Signed)
Please see Mychart message from patient. Thanks

## 2017-07-12 ENCOUNTER — Encounter: Payer: Self-pay | Admitting: Pulmonary Disease

## 2017-07-12 LAB — POCT INR: INR: 3 — AB (ref <=1.1)

## 2017-07-12 LAB — PROTIME-INR: Protime: 32.3 — AB (ref 10.0–13.8)

## 2017-07-13 ENCOUNTER — Non-Acute Institutional Stay (SKILLED_NURSING_FACILITY): Payer: Medicare Other | Admitting: Nurse Practitioner

## 2017-07-13 ENCOUNTER — Other Ambulatory Visit: Payer: Self-pay | Admitting: *Deleted

## 2017-07-13 ENCOUNTER — Encounter: Payer: Self-pay | Admitting: Nurse Practitioner

## 2017-07-13 DIAGNOSIS — E876 Hypokalemia: Secondary | ICD-10-CM

## 2017-07-13 DIAGNOSIS — M3130 Wegener's granulomatosis without renal involvement: Secondary | ICD-10-CM

## 2017-07-13 DIAGNOSIS — R634 Abnormal weight loss: Secondary | ICD-10-CM

## 2017-07-13 DIAGNOSIS — K219 Gastro-esophageal reflux disease without esophagitis: Secondary | ICD-10-CM | POA: Diagnosis not present

## 2017-07-13 DIAGNOSIS — I471 Supraventricular tachycardia: Secondary | ICD-10-CM

## 2017-07-13 NOTE — Assessment & Plan Note (Signed)
chronic cough, continue  Xopenex q6h prn, Atrovent neb tid, Robitussin prn, Advair bid,  on Septra DS definitely, pending Rheumatology consultation regarding steroid use.

## 2017-07-13 NOTE — Assessment & Plan Note (Signed)
She is losing weight continuously 06/13/17 #125.9 Ibs, 06/27/17 #122.5Ibs, 07/04/17 #121.6 Ibs, 07/06/17 #119.8Ibs, 07/11/17 #117.3Ibs. she admitted poor appetite, but denied abd pain, nausea, vomiting, constipation, or diarrhea. She is on dietary supplement. Continue to monitor weight, may consider appetite stimulants if POA consults. The patient desires her to return to [place] since she is getting stronger and performing her ADLs, but in my opinion, it may be possible if her weight is stabilized. Update CBC CMP

## 2017-07-13 NOTE — Assessment & Plan Note (Signed)
Continue Metoprolol 12.5mg  bid po,  her heart rate is in control.

## 2017-07-13 NOTE — Assessment & Plan Note (Signed)
stable GERD, continue Omeprazole.

## 2017-07-13 NOTE — Assessment & Plan Note (Signed)
Continue Kcl 66meq po bid, last K 4.6 06/28/17, update CMP

## 2017-07-13 NOTE — Progress Notes (Signed)
Location:  [facility] Room Number: 17 Place of Service:  SNF ([phone]) Provider:  Cedric Denison, [place]  NP  Blanchie Serve, MD  [name], MD as PCP - General (Internal Medicine) Unice Bailey, MD as Consulting Physician (Rheumatology) Rozetta Nunnery, MD as Consulting Physician (Otolaryngology) Marcial Pacas, MD as Consulting Physician (Neurology) Michel Bickers, MD as Consulting Physician (Infectious Diseases) Marin Olp Rudell Cobb, MD as Consulting Physician (Oncology) Juanito Doom, MD as Consulting Physician (Pulmonary Disease) Tanda Rockers, MD as Consulting Physician (Pulmonary Disease) Estill Dooms, MD as Consulting Physician (Geriatric Medicine) Coralie Keens, MD as Consulting Physician (General Surgery) Veta Dambrosia X, NP as Nurse Practitioner (Nurse Practitioner)  Extended Emergency Contact Information Primary Emergency Contact: [facility] Address: [place], [place] [address] [place] Phone: [phone] Mobile Phone: [phone] Relation: Daughter Secondary Emergency Contact: Kolasinski,[name] Address: [address] [place]          [place], [place] [address] [place] Phone: [phone] Mobile Phone: [phone] Relation: Son  Code Status:  Full Code Goals of care: Advanced Directive information Advanced Directives 07/06/2017  Does Patient Have a Medical Advance Directive? Yes  Type of Paramedic of [place];Living will;Out of facility DNR (pink MOST or yellow form)  Does patient want to make changes to medical advance directive? No - Patient declined  Copy of [place] in Chart? Yes  Would patient like information on creating a medical advance directive? -  Pre-existing out of facility DNR order (yellow form or pink MOST form) Yellow form placed in chart (order not valid for inpatient use);Pink MOST form  placed in chart (order not valid for inpatient use)     Chief Complaint  Patient presents with  . Medical Management of Chronic Issues    HPI:  Pt is a 84 y.o. female seen today for medical management of chronic diseases.      The patient was re admitted to SNF [facility]following hospitalization from 06/05/17 to 06/08/17 for multifocal atrial tachycardia, Metoprolol 12.5mg  bid added, her heart rate is in control. She was found to have left pleural effusion, the family declined thoracocentesis.   The patient has regained some physical strength, she is able to do her ADL independently, but she gets tired easily. She is losing weight continuously 06/13/17 #125.9 Ibs, 06/27/17 #122.5Ibs, 07/04/17 #121.6 Ibs, 07/06/17 #119.8Ibs, 07/11/17 #117.3Ibs. she admitted poor appetite, but denied abd pain, nausea, vomiting, constipation, or diarrhea. She is on dietary supplement she has history of Wegener's granulomatosis, COPD, chronic cough, taking Xopenex q6h prn, Atrovent neb tid, Robitussin prn, Advair bid,  on Septra DS definitely, pending Rheumatology consultation regarding steroid use. She has Hx of PE DVT, on Coumadin, today's INR 3, she is on Kcl 58meq po bid, last K 4.6, stable GERD on Omeprazole.    Past Medical History:  Diagnosis Date  . Acute respiratory failure with hypoxia ([place])   . Anemia   . Aspergillosis ([place])   . Bladder incontinence   . Blindness of one eye   . Cancer ([place])    melanoma   ([place])  . Chronic sinusitis   . Clotting disorder ([place])   . Difficult intubation    Difficult airway with intubation for ARF 07/22/14 (due to anterior larynx, also had mucous plug)  . Fatigue 12/09/2015  . Glaucoma   . Hearing loss in left  ear    hearing aid both ears  . History of pulmonary embolism 1997  . Interstitial emphysema ([place])   . Left shoulder pain 12/09/2015  . Multiple allergies   . Neuropathy   . OA (osteoarthritis)   . Pneumonia 12/15  . Shortness of breath dyspnea   . Stroke [facility])     ??? blind left eye  . Wegener's granulomatosis ([place])    Past Surgical History:  Procedure Laterality Date  . bronchosocpy     pt. states she has had over 10 in last 20 yrs.  Marland Kitchen CATARACT EXTRACTION    . COCHLEAR IMPLANT Left 10/27/2015   Procedure: LEFT COCHLEAR IMPLANT;  Surgeon: Leta Baptist, MD;  Location: [facility];  Service: ENT;  Laterality: Left;  . INGUINAL HERNIA REPAIR Left 02/17/2014   Procedure: LEFT INGUINAL HERNIA REPAIR WITH MESH;  Surgeon: Harl Bowie, MD;  Location: [facility];  Service: General;  Laterality: Left;  . INSERTION OF MESH N/A 02/17/2014   Procedure: INSERTION OF MESH;  Surgeon: Harl Bowie, MD;  Location: [facility];  Service: General;  Laterality: N/A;  . KNEE ARTHROSCOPY  2006   rt   . MELANOMA EXCISION     left leg  . PUBOVAGINAL SLING    . TEAR DUCT PROBING    . TUBAL LIGATION    . TYMPANOMASTOIDECTOMY Left 04/28/2015  . TYMPANOMASTOIDECTOMY Left 04/28/2015   Procedure: LEFT TYMPANOMASTOIDECTOMY;  Surgeon: Leta Baptist, MD;  Location: [facility];  Service: ENT;  Laterality: Left;  Marland Kitchen VENA CAVA FILTER PLACEMENT  1997   [place] filter    Allergies  Allergen Reactions  . Adhesive [Tape] Rash    Caused a rash on lower extremities     Outpatient Encounter Medications as of 07/13/2017  Medication Sig  . acetaminophen (TYLENOL) 325 MG tablet Take 650 mg by mouth See admin instructions. 650 mg at bedtime every night and may take an additional 650 mg every 12 hours as needed for pain  . Calcium Carbonate-Vitamin D3 (CALCIUM 600-D) 600-400 MG-UNIT TABS Take 1 tablet by mouth daily.  . Cholecalciferol (VITAMIN D-3) 1000 units CAPS Take 1,000 Units by mouth daily.  . diclofenac sodium (VOLTAREN) 1 % GEL Apply 2 g topically 3 (three) times daily. Apply to left shoulder.  . Fluticasone-Salmeterol (ADVAIR DISKUS) 500-50 MCG/DOSE AEPB INHALE 1 PUFF EVERY 12 HOURS.  . folic acid (FOLVITE) 1 MG tablet Take 1 mg by mouth daily.   Marland Kitchen  gabapentin (NEURONTIN) 100 MG capsule Take 100 mg by mouth 2 (two) times daily.   Marland Kitchen guaiFENesin (MUCINEX) 600 MG 12 hr tablet Take 600 mg by mouth every 12 (twelve) hours.   Marland Kitchen guaiFENesin-dextromethorphan (ROBITUSSIN DM) 100-10 MG/5ML syrup Take 5 mLs every 4 (four) hours as needed by mouth for cough.  . hydrocortisone cream 1 % Apply 1 application topically 4 (four) times daily as needed for itching.   Marland Kitchen ipratropium (ATROVENT) 0.02 % nebulizer solution Take 2.5 mLs (0.5 mg total) 3 (three) times daily by nebulization.  Marland Kitchen latanoprost (XALATAN) 0.005 % ophthalmic solution Place 1 drop into both eyes at bedtime.   . levalbuterol (XOPENEX) 0.63 MG/3ML nebulizer solution Take 0.63 mg by nebulization every 6 (six) hours as needed for wheezing or shortness of breath.  Marland Kitchen LORazepam (ATIVAN) 0.5 MG tablet Take 1 tablet (0.5 mg total) by mouth at bedtime.  . metoprolol tartrate (LOPRESSOR) 12.5 mg TABS tablet Take 12.5 mg by mouth 2 (two) times daily.  Marland Kitchen  naphazoline-pheniramine (NAPHCON-A) 0.025-0.3 % ophthalmic solution Place 1 drop 3 (three) times daily into the left eye.   . nystatin (NYSTATIN) powder Apply topically 2 (two) times daily.  Marland Kitchen omeprazole (PRILOSEC) 20 MG capsule Take 20 mg by mouth daily.  Marland Kitchen oxybutynin (DITROPAN) 5 MG tablet One twice daily to help bladder control  . OXYGEN Inhale 2 L into the lungs as needed (to maintain 90% o2).  . phenol (SORE THROAT SPRAY) 1.4 % LIQD Use as directed 1 spray every 8 (eight) hours as needed in the mouth or throat for throat irritation / pain.   . potassium chloride SA (K-DUR,KLOR-CON) 20 MEQ tablet Take 40 mEq by mouth 2 (two) times daily.  Marland Kitchen saccharomyces boulardii (FLORASTOR) 250 MG capsule Take 250 mg by mouth daily.   Marland Kitchen sulfamethoxazole-trimethoprim (BACTRIM DS,SEPTRA DS) 800-160 MG tablet Take 1 tablet by mouth every Monday, Wednesday, and Friday.   . [DISCONTINUED] warfarin (COUMADIN) 2.5 MG tablet Take 2.5 mg by mouth every other day.   .  [DISCONTINUED] metoprolol tartrate (LOPRESSOR) 25 MG tablet Take 0.5 tablets (12.5 mg total) by mouth 2 (two) times daily.   No facility-administered encounter medications on file as of 07/13/2017.     Review of Systems  Constitutional: Positive for appetite change, fatigue and unexpected weight change. Negative for activity change, chills and diaphoresis.  HENT: Positive for hearing loss and trouble swallowing. Negative for congestion and voice change.   Eyes: Negative for visual disturbance.  Respiratory: Positive for cough. Negative for choking, chest tightness, shortness of breath and wheezing.   Cardiovascular: Negative for chest pain, palpitations and leg swelling.  Gastrointestinal: Negative for abdominal distention, abdominal pain, constipation, diarrhea, nausea and vomiting.  Endocrine: Negative for cold intolerance.  Genitourinary: Positive for frequency. Negative for difficulty urinating, dysuria and urgency.  Musculoskeletal: Positive for gait problem. Negative for back pain.  Skin: Negative for color change, pallor, rash and wound.  Neurological: Negative for tremors, speech difficulty, weakness, numbness and headaches.  Psychiatric/Behavioral: Negative for agitation, behavioral problems, confusion, hallucinations and sleep disturbance. The patient is not nervous/anxious.     Immunization History  Administered Date(s) Administered  . Influenza Split 03/04/2012, 03/11/2014, 04/01/2015  . Influenza,inj,Quad PF,6+ Mos 03/03/2013  . Influenza-Unspecified 04/01/2015, 04/19/2016, 04/23/2017  . PPD Test 05/25/2015  . Pneumococcal Conjugate-13 07/01/2015  . Pneumococcal Polysaccharide-23 09/02/2015  . Zoster 09/30/1994   Pertinent  Health Maintenance Due  Topic Date Due  . INFLUENZA VACCINE  Completed  . DEXA SCAN  Completed  . PNA vac Low Risk Adult  Completed   Fall Risk  03/20/2017 12/09/2015 09/23/2015 09/09/2015 02/03/2015  Falls in the past year? No No Yes Yes Yes  Number  falls in past yr: - - 2 or more 2 or more 2 or more  Comment - - - - -  Injury with Fall? - - No No -   Functional Status Survey:    Vitals:   07/13/17 1219  BP: 126/64  Pulse: 65  Resp: 18  Temp: 97.8 F (36.6 C)  SpO2: 96%  Weight: 117 lb 4.8 oz (53.2 kg)  Height: 5\' 1"  (1.549 m)   Body mass index is 22.16 kg/m. Physical Exam  Constitutional: She is oriented to person, place, and time. She appears well-developed and well-nourished. No distress.  HENT:  Head: Normocephalic and atraumatic.  Eyes: Conjunctivae and EOM are normal. Pupils are equal, round, and reactive to light. Right eye exhibits no discharge. Left eye exhibits no discharge. No scleral icterus.  Left eye blind  Neck: Normal range of motion. Neck supple. No JVD present. No thyromegaly present.  Cardiovascular: Normal rate, regular rhythm and normal heart sounds.  No murmur heard. Pulmonary/Chest: Effort normal. She has no wheezes.  Scattered coarse rhonchi when taking deep breath, decreased air movement  Abdominal: Soft. Bowel sounds are normal. She exhibits no distension. There is no tenderness. There is no rebound and no guarding.  Musculoskeletal: Normal range of motion. She exhibits no edema or tenderness.  Neurological: She is alert and oriented to person, place, and time. She exhibits normal muscle tone. Coordination normal.  Skin: Skin is warm and dry. No rash noted. She is not diaphoretic. No erythema.  Psychiatric: She has a normal mood and affect. Her behavior is normal. Judgment and thought content normal.    Labs reviewed: Recent Labs    06/05/17 1850 06/08/17 0230  06/21/17 1105 06/28/17 07/16/17  NA 137 139   < > 143 142 137  K 4.2 3.9   < > 4.1 4.6 4.2  CL 107 110  --  107  --   --   CO2 23 25  --  26  --   --   GLUCOSE 119* 92  --  146*  --   --   BUN 5* 6   < > 6* 8 10  CREATININE 0.91 0.88   < > 1.1 0.9 0.9  CALCIUM 9.7 9.2  --  9.9  --   --    < > = values in this interval not  displayed.   Recent Labs    06/05/17 1026 06/05/17 1850  06/21/17 1105 06/28/17 07/16/17  AST 41* 39   < > 28 23 19   ALT 30 23   < > 21 10 10   ALKPHOS 128* 129*   < > 124* 131* 169*  BILITOT 0.50 0.5  --  0.50  --   --   PROT 6.1* 6.0*  --  6.9  --   --   ALBUMIN 2.8* 3.0*  --  3.0*  --   --    < > = values in this interval not displayed.   Recent Labs    06/05/17 1850 06/08/17 0230  06/19/17 06/21/17 1105 06/28/17 07/16/17  WBC 7.4 4.5   < > 5.2 4.4 5.6  --   NEUTROABS 5.7 2.7  --   --  3.4  --   --   HGB 10.4* 8.5*   < > 9.1* 11.2* 10.6* 11.5*  HCT 32.6* 27.0*   < > 26* 34.1* 32* 34*  MCV 109.0* 110.7*  --   --  111*  --   --   PLT 317 261  --  293 328 349 314   < > = values in this interval not displayed.   Lab Results  Component Value Date   TSH 1.160 06/06/2017   Lab Results  Component Value Date   HGBA1C 5.8 (H) 07/28/2014   No results found for: CHOL, HDL, LDLCALC, LDLDIRECT, TRIG, CHOLHDL  Significant Diagnostic Results in last 30 days:  No results found.  Assessment/Plan Weight loss She is losing weight continuously 06/13/17 #125.9 Ibs, 06/27/17 #122.5Ibs, 07/04/17 #121.6 Ibs, 07/06/17 #119.8Ibs, 07/11/17 #117.3Ibs. she admitted poor appetite, but denied abd pain, nausea, vomiting, constipation, or diarrhea. She is on dietary supplement. Continue to monitor weight, may consider appetite stimulants if POA consults. The patient desires her to return to AL since she is getting stronger and performing her ADLs, but in  my opinion, it may be possible if her weight is stabilized. Update CBC CMP   Paroxysmal supraventricular tachycardia (HCC)  Continue Metoprolol 12.5mg  bid po,  her heart rate is in control.   WEGENERS GRANULOMATOSIS chronic cough, continue  Xopenex q6h prn, Atrovent neb tid, Robitussin prn, Advair bid,  on Septra DS definitely, pending Rheumatology consultation regarding steroid use.  GERD (gastroesophageal reflux disease) stable GERD, continue  Omeprazole.     Hypokalemia Continue Kcl 70meq po bid, last K 4.6 06/28/17, update CMP       Family/ staff Communication: plan of care reviewed with the patent and charge nurse.   Labs/tests ordered: CBC CMP  Time spend 25 minutes.

## 2017-07-16 ENCOUNTER — Encounter: Payer: Self-pay | Admitting: Internal Medicine

## 2017-07-16 LAB — PROTIME-INR: PROTIME: 18.8 — AB (ref 10.0–13.8)

## 2017-07-16 LAB — BASIC METABOLIC PANEL
BUN: 10 (ref 4–21)
Creatinine: 0.9 (ref <=1.1)
GLUCOSE: 128
POTASSIUM: 4.2 (ref 3.4–5.3)
SODIUM: 137 (ref 137–147)

## 2017-07-16 LAB — CBC AND DIFFERENTIAL
HEMATOCRIT: 34 — AB (ref 36–46)
HEMOGLOBIN: 11.5 — AB (ref 12.0–16.0)
PLATELETS: 314 (ref 150–399)

## 2017-07-16 LAB — POCT INR: INR: 1.8 — AB (ref <=1.1)

## 2017-07-16 LAB — HEPATIC FUNCTION PANEL
ALT: 10 (ref 7–35)
AST: 19 (ref 13–35)
Alkaline Phosphatase: 169 — AB (ref 25–125)
Bilirubin, Total: 0.4

## 2017-07-17 ENCOUNTER — Other Ambulatory Visit: Payer: Self-pay | Admitting: *Deleted

## 2017-07-17 ENCOUNTER — Encounter: Payer: Self-pay | Admitting: Nurse Practitioner

## 2017-07-17 ENCOUNTER — Non-Acute Institutional Stay (SKILLED_NURSING_FACILITY): Payer: Medicare Other | Admitting: Nurse Practitioner

## 2017-07-17 DIAGNOSIS — E440 Moderate protein-calorie malnutrition: Secondary | ICD-10-CM | POA: Diagnosis not present

## 2017-07-17 DIAGNOSIS — K219 Gastro-esophageal reflux disease without esophagitis: Secondary | ICD-10-CM

## 2017-07-17 DIAGNOSIS — R1310 Dysphagia, unspecified: Secondary | ICD-10-CM | POA: Diagnosis not present

## 2017-07-17 DIAGNOSIS — E876 Hypokalemia: Secondary | ICD-10-CM | POA: Diagnosis not present

## 2017-07-17 DIAGNOSIS — G479 Sleep disorder, unspecified: Secondary | ICD-10-CM | POA: Diagnosis not present

## 2017-07-17 DIAGNOSIS — R634 Abnormal weight loss: Secondary | ICD-10-CM | POA: Diagnosis not present

## 2017-07-17 DIAGNOSIS — D500 Iron deficiency anemia secondary to blood loss (chronic): Secondary | ICD-10-CM | POA: Diagnosis not present

## 2017-07-17 DIAGNOSIS — M3130 Wegener's granulomatosis without renal involvement: Secondary | ICD-10-CM

## 2017-07-17 DIAGNOSIS — H1011 Acute atopic conjunctivitis, right eye: Secondary | ICD-10-CM

## 2017-07-17 DIAGNOSIS — Z7901 Long term (current) use of anticoagulants: Secondary | ICD-10-CM

## 2017-07-17 DIAGNOSIS — J90 Pleural effusion, not elsewhere classified: Secondary | ICD-10-CM

## 2017-07-17 DIAGNOSIS — R000 Tachycardia, unspecified: Secondary | ICD-10-CM | POA: Diagnosis not present

## 2017-07-17 NOTE — Assessment & Plan Note (Addendum)
The patient has regained some physical strength, she is able to do her ADL independently, but she gets tired easily. She is losing weight continuously 06/13/17 #125.9 Ibs, 06/27/17 #122.5Ibs, 07/04/17 #121.6 Ibs, 07/06/17 #119.8Ibs, 07/11/17 #117.3Ibs, 07/17/17 #112 Ibs, she admitted poor appetite, staff reported her oral intake is about 25% of her meals, POA attributed her weight loss to her unhappy placement in SNF and requested to discharge the patient to [facility] where she resided for better quality of life. The POA understands the patient is declining physically and mentally, her risk for re-hospitalization is high in setting of her co-morbidities. POA feels the patient might do better/be encouraged in AL setting, maybe motivated to eat better, better social interaction/activities, she enjoys playing bridge. The patient denied abd pain, nausea, vomiting, constipation, or diarrhea. She is on dietary supplement and nectar thick liquids. Will continue to monitor weight and encourage oral intake. Observe.

## 2017-07-17 NOTE — Assessment & Plan Note (Signed)
She has history of Wegener's granulomatosis, COPD, chronic cough, continue  Xopenex q6h prn, Atrovent neb tid, Robitussin prn, Advair bid,  on Septra DS definitely, pending Rheumatology consultation regarding steroid use.

## 2017-07-17 NOTE — Assessment & Plan Note (Addendum)
Stable, f/u hematology for iron infusion, 07/16/17 wbc 8.0, Hgb 11.5, plt 314, update VitB12 and Folate (on Folate) since MCV 107.7

## 2017-07-17 NOTE — Telephone Encounter (Signed)
BQ sent patient text today regarding previous emails.  Hi Dr. Lake Bells,  This is Jerolyn Center, Ms. Ciampa's son-in-law, we have spoken by phone and text. She will see you next week. Recent pneumonia, found to be aspirating and on thickened liquids. Has been slow to recover, very weak and deconditioned. We stopped some meds that might be culprits. Would like to do anything we can to prevent pneumonia and build her strength back. PCP seems to think her emphysema/COPD is severe; my thought was it wasn't all that bad, but maybe has progressed. Welcome any ideas/suggestions. My cell is [phone]. Can also do MyChart or email: [email or web address]. Thanks. Clair Gulling

## 2017-07-17 NOTE — Assessment & Plan Note (Signed)
She was found to have left pleural effusion, the family declined thoracocentesis.

## 2017-07-17 NOTE — Assessment & Plan Note (Signed)
Continue Kcl 60meq po bid, last K 4.2 07/16/17

## 2017-07-17 NOTE — Assessment & Plan Note (Signed)
07/16/17 total protein 5.8, albumin 2.9, continue dietary supplement, encourage oral intake

## 2017-07-17 NOTE — Assessment & Plan Note (Signed)
Continue Metoprolol 12.5mg  bid, her heart rate is 100bpm at rest today.

## 2017-07-17 NOTE — Assessment & Plan Note (Signed)
Continue nectar thick liquids, aspiration precaution.

## 2017-07-17 NOTE — Assessment & Plan Note (Signed)
Persists, but improved, she is able to perform ADL independently, she should get stronger if her oral intake is increased once she returns to AL as she desires. 07/16/17 Total protein 5.8, albumin 2.9

## 2017-07-17 NOTE — Telephone Encounter (Signed)
Text sent earlier to son-in-law.     ----- Message -----  From: Georjean Mode, CMA  Sent: 07/13/2017  9:29 AM  To: Juanito Doom, MD  Subject: FW: Non-Urgent Medical Question           Good Morning    Please advise if patient should be seen earlier than 07/18/2017 for hospital follow up. Patient's son in law is concerned, and would like for me to route message to you today.      Thank you,    Bradley Ferris, CMA  ----- Message -----  From: Orland Mustard  Sent: 07/12/2017  9:17 PM  To: [facility]  Subject: Non-Urgent Medical Question             ----- Message from [place], Generic sent at 07/12/2017 9:17 PM EST -----    Hi Dr. Lake Bells,   This is Jerolyn Center, Ms. [place]'s son-in-law, we have spoken by phone and text. She will see you next week. Recent pneumonia, found to be aspirating and on thickened liquids. Has been slow to recover, very weak and deconditioned. We stopped some meds that might be culprits. Would like to do anything we can to prevent pneumonia and build her strength back. PCP seems to think her emphysema/COPD is severe; my thought was it wasn't all that bad, but maybe has progressed. Welcome any ideas/suggestions. My cell is [phone]. Can also do MyChart or email: [email or web address]. Thanks. Clair Gulling

## 2017-07-17 NOTE — Progress Notes (Signed)
Location:  [facility] Room Number: 17 Place of Service:  SNF (31)  Provider: Marlana Latus NP  PCP: Blanchie Serve, MD [name], MD as PCP - General (Internal Medicine) Unice Bailey, MD as Consulting Physician (Rheumatology) Rozetta Nunnery, MD as Consulting Physician (Otolaryngology) Marcial Pacas, MD as Consulting Physician (Neurology) Michel Bickers, MD as Consulting Physician (Infectious Diseases) Marin Olp Rudell Cobb, MD as Consulting Physician (Oncology) Juanito Doom, MD as Consulting Physician (Pulmonary Disease) Tanda Rockers, MD as Consulting Physician (Pulmonary Disease) Estill Dooms, MD as Consulting Physician (Geriatric Medicine) Coralie Keens, MD as Consulting Physician (General Surgery) Slyvia Lartigue X, NP as Nurse Practitioner (Nurse Practitioner)  Extended Emergency Contact Information Primary Emergency Contact: [facility] Address: [place], [place] [address] [place] Phone: [phone] Mobile Phone: [phone] Relation: Daughter Secondary Emergency Contact: Huhta,[name] Address: [address] [place]          [place], [place] [address] [place] Phone: [phone] Mobile Phone: [phone] Relation: Son  Code Status: Full Code Goals of care:  Advanced Directive information Advanced Directives 07/23/2017  Does Patient Have a Medical Advance Directive? Yes  Type of Advance Directive Living will;Healthcare Power of Attorney  Does patient want to make changes to medical advance directive? No - Patient declined  Copy of [place] in Chart? Yes  Would patient like information on creating a medical advance directive? -  Pre-existing out of facility DNR order (yellow form or pink MOST form) -     Allergies  Allergen Reactions  . Adhesive [Tape] Rash    Caused a rash on lower extremities     Chief Complaint    Patient presents with  . Discharge Note    Patient to return to [place]    HPI:  84 y.o. female  Is seen for evaluation and discharge to [place].    The patient was re admitted to SNF [facility].following hospitalization from 06/05/17 to 06/08/17 for multifocal atrial tachycardia, Metoprolol 12.5mg  bid added, her heart rate is 100bpm at rest today.  She was found to have left pleural effusion, the family declined thoracocentesis.   The patient has regained some physical strength, she is able to do her ADL independently, but she gets tired easily. She is losing weight continuously 06/13/17 #125.9 Ibs, 06/27/17 #122.5Ibs, 07/04/17 #121.6 Ibs, 07/06/17 #119.8Ibs, 07/11/17 #117.3Ibs, 07/17/17 #112 Ibs, she admitted poor appetite, staff reported her oral intake is about 25% of her meals, POA attributed her weight loss to her unhappy placement in SNF and requested to discharge the patient to [place] where she resided for quality of life. She denied abd pain, nausea, vomiting, constipation, or diarrhea. She is on dietary supplement. She has history of Wegener's granulomatosis, COPD, chronic cough, taking Xopenex q6h prn, Atrovent neb tid, Robitussin prn, Advair bid,  on Septra DS definitely, pending Rheumatology consultation regarding steroid use. She has Hx of PE DVT, on Coumadin, today's INR 3, she is on Kcl 46meq po bid, last K 4.2 07/16/17, stable GERD on Omeprazole.    The patient was noted to have redness in her right eye, denied pain, itching, irritation, or vision changes.    Past Medical History:  Diagnosis Date  . Acute respiratory failure with hypoxia ([place])   . Anemia   . Aspergillosis ([place])   . Bladder incontinence   . Blindness of  one eye   . Cancer ([place])    melanoma   ([place])  . Chronic sinusitis   . Clotting disorder ([place])   . Difficult intubation    Difficult airway with intubation for ARF 07/22/14 (due to anterior larynx, also had mucous plug)  . Fatigue 12/09/2015  . Glaucoma   . Hearing loss in left  ear    hearing aid both ears  . History of pulmonary embolism 1997  . Interstitial emphysema ([place])   . Left shoulder pain 12/09/2015  . Multiple allergies   . Neuropathy   . OA (osteoarthritis)   . Pneumonia 12/15  . Shortness of breath dyspnea   . Stroke [facility])    ??? blind left eye  . Wegener's granulomatosis ([place])     Past Surgical History:  Procedure Laterality Date  . bronchosocpy     pt. states she has had over 10 in last 20 yrs.  Marland Kitchen CATARACT EXTRACTION    . COCHLEAR IMPLANT Left 10/27/2015   Procedure: LEFT COCHLEAR IMPLANT;  Surgeon: Leta Baptist, MD;  Location: [facility];  Service: ENT;  Laterality: Left;  . INGUINAL HERNIA REPAIR Left 02/17/2014   Procedure: LEFT INGUINAL HERNIA REPAIR WITH MESH;  Surgeon: Harl Bowie, MD;  Location: [facility];  Service: General;  Laterality: Left;  . INSERTION OF MESH N/A 02/17/2014   Procedure: INSERTION OF MESH;  Surgeon: Harl Bowie, MD;  Location: [facility];  Service: General;  Laterality: N/A;  . KNEE ARTHROSCOPY  2006   rt   . MELANOMA EXCISION     left leg  . PUBOVAGINAL SLING    . TEAR DUCT PROBING    . TUBAL LIGATION    . TYMPANOMASTOIDECTOMY Left 04/28/2015  . TYMPANOMASTOIDECTOMY Left 04/28/2015   Procedure: LEFT TYMPANOMASTOIDECTOMY;  Surgeon: Leta Baptist, MD;  Location: [facility];  Service: ENT;  Laterality: Left;  Marland Kitchen VENA CAVA FILTER PLACEMENT  1997   [place] filter      reports that she quit smoking about 33 years ago. Her smoking use included cigarettes. She has a 45.00 pack-year smoking history. she has never used smokeless tobacco. She reports that she does not drink alcohol or use drugs. Social History   Socioeconomic History  . Marital status: Married    Spouse name: Not on file  . Number of children: Not on file  . Years of education: Not on file  . Highest education level: Not on file  Social Needs  . Financial resource strain: Not on file  . Food insecurity - worry: Not on  file  . Food insecurity - inability: Not on file  . Transportation needs - medical: Not on file  . Transportation needs - non-medical: Not on file  Occupational History  . Occupation: retired Games developer: RETIRED  Tobacco Use  . Smoking status: Former Smoker    Packs/day: 1.50    Years: 30.00    Pack years: 45.00    Types: Cigarettes    Last attempt to quit: 07/03/1984    Years since quitting: 33.0  . Smokeless tobacco: Never Used  . Tobacco comment: quit smoking 30 years ago  Substance and Sexual Activity  . Alcohol use: No    Alcohol/week: 0.0 oz  . Drug use: No  . Sexual activity: No  Other Topics Concern  . Not on file  Social History Narrative   Lives at [facility] moved to [place] 05/25/15   Widowed,husband dementia,  died 10/2016   Former smoker stopped 1986   Alcohol occasional wine   Exercise PT 3 x a week   Power wheelchair   Has POA, Living Will   Functional Status Survey:    Allergies  Allergen Reactions  . Adhesive [Tape] Rash    Caused a rash on lower extremities     Pertinent  Health Maintenance Due  Topic Date Due  . INFLUENZA VACCINE  Completed  . DEXA SCAN  Completed  . PNA vac Low Risk Adult  Completed    Medications: Outpatient Encounter Medications as of 07/17/2017  Medication Sig  . acetaminophen (TYLENOL) 325 MG tablet Take 650 mg by mouth See admin instructions. 650 mg at bedtime every night and may take an additional 650 mg every 12 hours as needed for pain  . Calcium Carbonate-Vitamin D3 (CALCIUM 600-D) 600-400 MG-UNIT TABS Take 1 tablet by mouth daily.  . Cholecalciferol (VITAMIN D-3) 1000 units CAPS Take 1,000 Units by mouth daily.  . diclofenac sodium (VOLTAREN) 1 % GEL Apply 2 g topically 3 (three) times daily. Apply to left shoulder.  . Fluticasone-Salmeterol (ADVAIR DISKUS) 500-50 MCG/DOSE AEPB INHALE 1 PUFF EVERY 12 HOURS.  . folic acid (FOLVITE) 1 MG tablet Take 1 mg by mouth daily.   Marland Kitchen gabapentin (NEURONTIN)  100 MG capsule Take 100 mg by mouth 2 (two) times daily.   Marland Kitchen guaiFENesin (MUCINEX) 600 MG 12 hr tablet Take 600 mg by mouth every 12 (twelve) hours.   Marland Kitchen guaiFENesin-dextromethorphan (ROBITUSSIN DM) 100-10 MG/5ML syrup Take 5 mLs every 4 (four) hours as needed by mouth for cough.  . hydrocortisone cream 1 % Apply 1 application topically 4 (four) times daily as needed for itching.   Marland Kitchen ipratropium (ATROVENT) 0.02 % nebulizer solution Take 2.5 mLs (0.5 mg total) 3 (three) times daily by nebulization.  Marland Kitchen latanoprost (XALATAN) 0.005 % ophthalmic solution Place 1 drop into both eyes at bedtime.   . levalbuterol (XOPENEX) 0.63 MG/3ML nebulizer solution Take 0.63 mg by nebulization every 6 (six) hours as needed for wheezing or shortness of breath.  Marland Kitchen LORazepam (ATIVAN) 0.5 MG tablet Take 1 tablet (0.5 mg total) by mouth at bedtime. (Patient taking differently: Take 0.25 mg by mouth at bedtime. )  . metoprolol tartrate (LOPRESSOR) 12.5 mg TABS tablet Take 12.5 mg by mouth 2 (two) times daily.  . naphazoline-pheniramine (NAPHCON-A) 0.025-0.3 % ophthalmic solution Place 1 drop 3 (three) times daily into the left eye.   . nystatin (NYSTATIN) powder Apply topically 2 (two) times daily.  Marland Kitchen omeprazole (PRILOSEC) 20 MG capsule Take 20 mg by mouth daily.  . OXYGEN Inhale 2 L into the lungs as needed (to maintain 90% o2).  . phenol (SORE THROAT SPRAY) 1.4 % LIQD Use as directed 1 spray every 8 (eight) hours as needed in the mouth or throat for throat irritation / pain.   . potassium chloride SA (K-DUR,KLOR-CON) 20 MEQ tablet Take 40 mEq by mouth 2 (two) times daily.  Marland Kitchen saccharomyces boulardii (FLORASTOR) 250 MG capsule Take 250 mg by mouth daily.   Marland Kitchen sulfamethoxazole-trimethoprim (BACTRIM DS,SEPTRA DS) 800-160 MG tablet Take 1 tablet by mouth every Monday, Wednesday, and Friday.   . warfarin (COUMADIN) 2 MG tablet Take 2 mg by mouth one time only at 6 PM.  . [DISCONTINUED] oxybutynin (DITROPAN) 5 MG tablet One twice  daily to help bladder control  . [DISCONTINUED] metoprolol tartrate (LOPRESSOR) 25 MG tablet Take 0.5 tablets (12.5 mg total) by mouth 2 (two) times  daily.  . [DISCONTINUED] warfarin (COUMADIN) 2.5 MG tablet Take 2.5 mg by mouth every other day.    No facility-administered encounter medications on file as of 07/17/2017.     Review of Systems  Constitutional: Positive for activity change, appetite change, fatigue and unexpected weight change. Negative for chills, diaphoresis and fever.       Fail, weak  HENT: Positive for hearing loss and trouble swallowing. Negative for congestion and voice change.        Nectar thick liquids  Eyes: Positive for visual disturbance.       Blind left eye, right eye redness, denied pain or itching.   Respiratory: Positive for cough and shortness of breath. Negative for choking, chest tightness and wheezing.   Cardiovascular: Negative for chest pain, palpitations and leg swelling.  Gastrointestinal: Negative for abdominal distention, abdominal pain, constipation, diarrhea, nausea and vomiting.  Endocrine: Negative for cold intolerance.  Genitourinary: Negative for difficulty urinating, dysuria, frequency and urgency.  Musculoskeletal: Positive for gait problem. Negative for arthralgias, back pain and joint swelling.  Skin: Negative for color change, pallor, rash and wound.  Neurological: Negative for tremors, speech difficulty, weakness and headaches.  Psychiatric/Behavioral: Negative for agitation, behavioral problems, confusion, hallucinations and sleep disturbance. The patient is not nervous/anxious.        Stated she will be happy to return to AL    Vitals:   07/17/17 1139  BP: 120/62  Pulse: 76  Resp: 20  Temp: 97.8 F (36.6 C)  SpO2: 95%  Weight: 117 lb 4.8 oz (53.2 kg)  Height: 5\' 1"  (1.549 m)   Body mass index is 22.16 kg/m. Physical Exam  Constitutional: She is oriented to person, place, and time. She appears well-developed and  well-nourished. No distress.  frail  HENT:  Head: Normocephalic and atraumatic.  Mouth/Throat: Oropharynx is clear and moist. No oropharyngeal exudate.  Eyes: No scleral icterus.  Blind left eye, pupil R>L, injected right conjunctiva, watery appearance  Neck: Normal range of motion. Neck supple. No JVD present. No thyromegaly present.  Cardiovascular: Normal rate, regular rhythm and normal heart sounds.  No murmur heard. HR 100bpm at rest  Pulmonary/Chest: Effort normal and breath sounds normal. She has no wheezes.  Scattered coarse rhonchi  Abdominal: Soft. Bowel sounds are normal. She exhibits no distension. There is no tenderness. There is no rebound and no guarding.  Musculoskeletal: Normal range of motion. She exhibits no edema or tenderness.  Transfers self, electric scooter for mobility  Neurological: She is alert and oriented to person, place, and time. She exhibits normal muscle tone. Coordination normal.  Skin: Skin is warm and dry. No rash noted. She is not diaphoretic. No erythema.  Psychiatric: She has a normal mood and affect. Her behavior is normal. Judgment and thought content normal.    Labs reviewed: Basic Metabolic Panel: Recent Labs    06/21/17 1105  07/16/17 07/18/17 1448 07/19/17 0959 07/24/17  NA 143   < > 137 135 136 137  K 4.1   < > 4.2 4.6 4.8* 4.5  CL 107  --   --  103 106  --   CO2 26  --   --  24 21*  --   GLUCOSE 146*  --   --  133* 145*  --   BUN 6*   < > 10 10 10 12   CREATININE 1.1   < > 0.9 0.77  --  0.7  CALCIUM 9.9  --   --  10.2 10.5*  --    < > =  values in this interval not displayed.   Liver Function Tests: Recent Labs    06/05/17 1850  06/21/17 1105 06/28/17 07/16/17 07/19/17 0959  AST 39   < > 28 23 19 26   ALT 23   < > 21 10 10 12   ALKPHOS 129*   < > 124* 131* 169* 203*  BILITOT 0.5  --  0.50  --   --  0.3  PROT 6.0*  --  6.9  --   --  7.1  ALBUMIN 3.0*  --  3.0*  --   --  2.6*   < > = values in this interval not displayed.    No results for input(s): LIPASE, AMYLASE in the last 8760 hours. No results for input(s): AMMONIA in the last 8760 hours. CBC: Recent Labs    06/21/17 1105  07/16/17 07/18/17 1448 07/19/17 0959 07/24/17  WBC 4.4   < >  --  7.9 A Manual Differential was performed and is consistent with the Automated Differential. 9.1 10.2  NEUTROABS 3.4  --   --  6.4 7.8*  --   HGB 11.2*   < > 11.5* 12.9  --  10.7*  HCT 34.1*   < > 34* 39.5 37.7 30*  MCV 111*  --   --  106.3* 105.6*  --   PLT 328   < > 314 421.0* 355 381   < > = values in this interval not displayed.   Cardiac Enzymes: No results for input(s): CKTOTAL, CKMB, CKMBINDEX, TROPONINI in the last 8760 hours. BNP: Invalid input(s): POCBNP CBG: No results for input(s): GLUCAP in the last 8760 hours.  Procedures and Imaging Studies During Stay: Dg Chest 2 View  Result Date: 07/19/2017 CLINICAL DATA:  Routine examination. No current complaints. History of emphysema, former smoker. EXAM: CHEST  2 VIEW COMPARISON:  Chest x-ray of June 07, 2017 FINDINGS: The lungs are well-expanded. There is no focal infiltrate. There is no pleural effusion. The heart and pulmonary vascularity are normal. The mediastinum is normal in width. There calcification in the wall of the aortic arch the bony thorax exhibits no acute abnormality. IMPRESSION: There is no active cardiopulmonary disease. Thoracic aortic atherosclerosis. Electronically Signed   By: David  Martinique M.D.   On: 07/19/2017 07:47    Assessment/Plan:   Weight loss The patient has regained some physical strength, she is able to do her ADL independently, but she gets tired easily. She is losing weight continuously 06/13/17 #125.9 Ibs, 06/27/17 #122.5Ibs, 07/04/17 #121.6 Ibs, 07/06/17 #119.8Ibs, 07/11/17 #117.3Ibs, 07/17/17 #112 Ibs, she admitted poor appetite, staff reported her oral intake is about 25% of her meals, POA attributed her weight loss to her unhappy placement in SNF and requested to discharge  the patient to [place] where she resided for better quality of life. The POA understands the patient is declining physically and mentally, her risk for re-hospitalization is high in setting of her co-morbidities. POA feels the patient might do better/be encouraged in AL setting, maybe motivated to eat better, better social interaction/activities, she enjoys playing bridge. The patient denied abd pain, nausea, vomiting, constipation, or diarrhea. She is on dietary supplement and nectar thick liquids. Will continue to monitor weight and encourage oral intake. Observe.       Tachycardia Continue Metoprolol 12.5mg  bid, her heart rate is 100bpm at rest today.   Pleural effusion, left She was found to have left pleural effusion, the family declined thoracocentesis.    WEGENERS GRANULOMATOSIS She has history of  Wegener's granulomatosis, COPD, chronic cough, continue  Xopenex q6h prn, Atrovent neb tid, Robitussin prn, Advair bid,  on Septra DS definitely, pending Rheumatology consultation regarding steroid use.   Long term current use of anticoagulant therapy She has Hx of PE DVT, on Coumadin, today's INR 1.8 07/16/17, Coumadin resumed 2.0 qod, pending PT/IRN 07/19/17  Hypokalemia Continue Kcl 36meq po bid, last K 4.2 07/16/17    GERD (gastroesophageal reflux disease) stable GERD, continue Omeprazole.   Iron deficiency anemia due to chronic blood loss Stable, f/u hematology for iron infusion, 07/16/17 wbc 8.0, Hgb 11.5, plt 314, update VitB12 and Folate (on Folate) since MCV 107.7   Fatigue Persists, but improved, she is able to perform ADL independently, she should get stronger if her oral intake is increased once she returns to AL as she desires. 07/16/17 Total protein 5.8, albumin 2.9  Protein-calorie malnutrition ([place]) 07/16/17 total protein 5.8, albumin 2.9, continue dietary supplement, encourage oral intake  Conjunctivitis Right eye, injected, watery appearance, denied pain, itching, or  change of vision, will apply artifical tears qid x one week.   Dysphagia Continue nectar thick liquids, aspiration precaution.   Sleep disorder She stated she sleeps well at night, decrease Lorazepam 0.25mg /0.5mg  hs, phone call made to [place] 3:30pm 07/20/17, she consents the GDR.      Patient is being discharged with the following home health services:    Patient is being discharged with the following durable medical equipment:    Patient has been advised to f/u with their PCP in 1-2 weeks to for a transitions of care visit.  Social services at their facility was responsible for arranging this appointment.  Pt was provided with adequate prescriptions of noncontrolled medications to reach the scheduled appointment .  For controlled substances, a limited supply was provided as appropriate for the individual patient.  If the pt normally receives these medications from a pain clinic or has a contract with another physician, these medications should be received from that clinic or physician only).    Future labs/tests needed:  Vit B12, Folate  Time spend 25 minutes.

## 2017-07-17 NOTE — Assessment & Plan Note (Signed)
She has Hx of PE DVT, on Coumadin, today's INR 1.8 07/16/17, Coumadin resumed 2.0 qod, pending PT/IRN 07/19/17

## 2017-07-17 NOTE — Assessment & Plan Note (Signed)
stable GERD, continue Omeprazole.

## 2017-07-17 NOTE — Assessment & Plan Note (Signed)
Right eye, injected, watery appearance, denied pain, itching, or change of vision, will apply artifical tears qid x one week.

## 2017-07-18 ENCOUNTER — Encounter: Payer: Self-pay | Admitting: Pulmonary Disease

## 2017-07-18 ENCOUNTER — Other Ambulatory Visit (INDEPENDENT_AMBULATORY_CARE_PROVIDER_SITE_OTHER): Payer: Medicare Other

## 2017-07-18 ENCOUNTER — Ambulatory Visit (INDEPENDENT_AMBULATORY_CARE_PROVIDER_SITE_OTHER)
Admission: RE | Admit: 2017-07-18 | Discharge: 2017-07-18 | Disposition: A | Discharge status: Home / Self Care | Payer: Medicare Other | Source: Ambulatory Visit | Attending: Pulmonary Disease | Admitting: Pulmonary Disease

## 2017-07-18 ENCOUNTER — Ambulatory Visit (INDEPENDENT_AMBULATORY_CARE_PROVIDER_SITE_OTHER): Payer: Medicare Other | Admitting: Pulmonary Disease

## 2017-07-18 VITALS — BP 124/68 | HR 124

## 2017-07-18 DIAGNOSIS — J45909 Unspecified asthma, uncomplicated: Secondary | ICD-10-CM

## 2017-07-18 DIAGNOSIS — R0600 Dyspnea, unspecified: Secondary | ICD-10-CM

## 2017-07-18 DIAGNOSIS — M3130 Wegener's granulomatosis without renal involvement: Secondary | ICD-10-CM | POA: Diagnosis not present

## 2017-07-18 LAB — CBC WITH DIFFERENTIAL/PLATELET
BASOS ABS: 0 10*3/uL (ref 0.0–0.1)
Basophils Relative: 0.6 % (ref 0.0–3.0)
EOS ABS: 0.1 10*3/uL (ref 0.0–0.7)
Eosinophils Relative: 0.8 % (ref 0.0–5.0)
HEMATOCRIT: 39.5 % (ref 36.0–46.0)
Hemoglobin: 12.9 g/dL (ref 12.0–15.0)
LYMPHS ABS: 0.5 10*3/uL — AB (ref 0.7–4.0)
Lymphocytes Relative: 6.1 % — ABNORMAL LOW (ref 12.0–46.0)
MCHC: 32.6 g/dL (ref 30.0–36.0)
MCV: 106.3 fl — ABNORMAL HIGH (ref 78.0–100.0)
MONO ABS: 0.9 10*3/uL (ref 0.1–1.0)
Monocytes Relative: 11.6 % (ref 3.0–12.0)
NEUTROS ABS: 6.4 10*3/uL (ref 1.4–7.7)
NEUTROS PCT: 80.9 % — AB (ref 43.0–77.0)
PLATELETS: 421 10*3/uL — AB (ref 150.0–400.0)
RBC: 3.72 Mil/uL — ABNORMAL LOW (ref 3.87–5.11)
RDW: 14.9 % (ref 11.5–15.5)
WBC: 7.9 10*3/uL (ref 4.0–10.5)

## 2017-07-18 LAB — BASIC METABOLIC PANEL
BUN: 10 mg/dL (ref 6–23)
CALCIUM: 10.2 mg/dL (ref 8.4–10.5)
CO2: 24 mEq/L (ref 19–32)
CREATININE: 0.77 mg/dL (ref 0.40–1.20)
Chloride: 103 mEq/L (ref 96–112)
GFR: 76.13 mL/min (ref >60.00)
GLUCOSE: 133 mg/dL — AB (ref 70–99)
Potassium: 4.6 mEq/L (ref 3.5–5.1)
Sodium: 135 mEq/L (ref 135–145)

## 2017-07-18 NOTE — Progress Notes (Signed)
Subjective:    Patient ID: QUANA CHAMBERLAIN, female    DOB: 05/02/1933, 84 y.o.   MRN: [id number]  Synopsis: Mrs. Schremp has asthma with normal pulmonary function testing. She also has a history of Wegener's granulomatosis and has been treated with high-dose immunosuppressants for many years. She has significant sinus disease related to this and she has lower airway obstruction which has been treated with balloon dilation in the past.  HPI Chief Complaint  Patient presents with  . Hospitalization Follow-up    pt seen in ED last month for pleural effusion, PNA.  Pt states she is doing well today.     Viktoriya says that her breathing is OK right. She has some cough with slimy mucus. She has no fever or chills.   She spends a lot of time in her chair.   No wheezing.    She was hospitalized recently for pneumonia.  She was found to have evidence of aspiration.  She also had evidence of a pleural effusion on the left.  She and the family deferred thoracentesis.  Past Medical History:  Diagnosis Date  . Acute respiratory failure with hypoxia ([place])   . Anemia   . Aspergillosis ([place])   . Bladder incontinence   . Blindness of one eye   . Cancer ([place])    melanoma   ([place])  . Chronic sinusitis   . Clotting disorder ([place])   . Difficult intubation    Difficult airway with intubation for ARF 07/22/14 (due to anterior larynx, also had mucous plug)  . Fatigue 12/09/2015  . Glaucoma   . Hearing loss in left ear    hearing aid both ears  . History of pulmonary embolism 1997  . Interstitial emphysema ([place])   . Left shoulder pain 12/09/2015  . Multiple allergies   . Neuropathy   . OA (osteoarthritis)   . Pneumonia 12/15  . Shortness of breath dyspnea   . Stroke [facility])    ??? blind left eye  . Wegener's granulomatosis ([place])       Review of Systems  Constitutional: Positive for fatigue. Negative for fever.  HENT: Negative for postnasal drip, rhinorrhea and sinus pressure.   Respiratory:  Positive for cough. Negative for shortness of breath and wheezing.   Cardiovascular: Negative for chest pain, palpitations and leg swelling.       Objective:   Physical Exam  Vitals:   07/18/17 1400  BP: 124/68  Pulse: (!) 124  SpO2: 98%   RA  Gen: chronically appearing HENT: OP clear, TM's clear, neck supple PULM: No wheezing normal effort, normal percussion CV: RRR, no mgr, trace edema GI: BS+, soft, nontender Derm: no cyanosis or rash Psyche: normal mood and affect   July 2018 OSH labs: Cr normal, ALT up slightly at 41  Chest imaging: CT chest images reviewed from her recent hospitalization showed a chronic bleb in the left upper lobe and pleural fluid.   CBC    Component Value Date/Time   WBC 5.6 06/28/2017   WBC 2.55 06/12/2017   RBC 3.08 (L) 06/21/2017 1105   RBC 2.44 (L) 06/08/2017 0230   HGB 11.5 (A) 07/16/2017   HGB 11.2 (L) 06/21/2017 1105   HGB 9.0 06/12/2017   HCT 34 (A) 07/16/2017   HCT 34.1 (L) 06/21/2017 1105   PLT 314 07/16/2017   PLT 328 06/21/2017 1105   MCV 111 (H) 06/21/2017 1105   MCH 36.4 (H) 06/21/2017 1105   MCH 34.8 (H) 06/08/2017 0230  MCHC 32.8 06/21/2017 1105   MCHC 31.5 06/08/2017 0230   RDW 14.6 06/21/2017 1105   LYMPHSABS 0.3 (L) 06/21/2017 1105   MONOABS 0.6 06/08/2017 0230   EOSABS 0.1 06/21/2017 1105   BASOSABS 0.0 06/21/2017 1105    Hospital records from her December 2018 hospitalization reviewed where she was admitted for an infiltrate and effusion on the right, likely had silent aspiration based on speech therapy evaluation.     Assessment & Plan:  Dyspnea, unspecified type - Plan: DG Chest 2 View, CBC w/Diff, Basic Metabolic Panel (BMET)  WEGENERS GRANULOMATOSIS  Intrinsic asthma  Discussion: 84 y/o female with known diagnosis of Wegener's who was hospitalized for aspiration pneumonia in 06/2017.  Since then she has become profoundly deconditioned.  Today on exam she is not acutely ill but she is profoundly  chronically ill with deconditioning and malnourishment.  She does not seem to have an acute pulmonary illness right now though she does seem quite weak.  I have never felt her to have severe chronic lung disease.  Mostly mild to moderate airflow obstruction related to her Wegener's and asthma.  She has had a chronic bleb in the left upper lobe which recently was surrounded by fluid with her pneumonia and pleural effusion.  We will repeat a chest x-ray to follow-up on this today.  We will also check blood work and an oxygen level today just to make sure there is nothing else acute going on.  Plan: Wegener's granulomatosis I agree with holding off on any further immunosuppression  Asthma: Continue Advair  Recent aspiration pneumonia: Continue speech therapy recommendations We will repeat a chest x-ray We will check blood work today to make sure that your kidney liver and blood function tests are all normal. We will check an oxygen level with ambulation today  In general I think the best approach is continued nutritional support with advice from speech therapy and physical therapy.  Follow-up 2-3 months or sooner if needed  I called her son-in-law today to give him an update.  He agrees with our plan of care.     Current Outpatient Medications:  .  acetaminophen (TYLENOL) 325 MG tablet, Take 650 mg by mouth See admin instructions. 650 mg at bedtime every night and may take an additional 650 mg every 12 hours as needed for pain, Disp: , Rfl:  .  Calcium Carbonate-Vitamin D3 (CALCIUM 600-D) 600-400 MG-UNIT TABS, Take 1 tablet by mouth daily., Disp: , Rfl:  .  Cholecalciferol (VITAMIN D-3) 1000 units CAPS, Take 1,000 Units by mouth daily., Disp: , Rfl:  .  diclofenac sodium (VOLTAREN) 1 % GEL, Apply 2 g topically 3 (three) times daily. Apply to left shoulder., Disp: , Rfl:  .  Fluticasone-Salmeterol (ADVAIR DISKUS) 500-50 MCG/DOSE AEPB, INHALE 1 PUFF EVERY 12 HOURS., Disp: 180 each, Rfl:  1 .  folic acid (FOLVITE) 1 MG tablet, Take 1 mg by mouth daily. , Disp: , Rfl:  .  gabapentin (NEURONTIN) 100 MG capsule, Take 100 mg by mouth 2 (two) times daily. , Disp: , Rfl:  .  guaiFENesin (MUCINEX) 600 MG 12 hr tablet, Take 600 mg by mouth every 12 (twelve) hours. , Disp: , Rfl:  .  guaiFENesin-dextromethorphan (ROBITUSSIN DM) 100-10 MG/5ML syrup, Take 5 mLs every 4 (four) hours as needed by mouth for cough., Disp: 118 mL, Rfl: 0 .  hydrocortisone cream 1 %, Apply 1 application topically 4 (four) times daily as needed for itching. , Disp: , Rfl:  .  ipratropium (ATROVENT) 0.02 % nebulizer solution, Take 2.5 mLs (0.5 mg total) 3 (three) times daily by nebulization., Disp: 75 mL, Rfl: 12 .  latanoprost (XALATAN) 0.005 % ophthalmic solution, Place 1 drop into both eyes at bedtime. , Disp: , Rfl:  .  levalbuterol (XOPENEX) 0.63 MG/3ML nebulizer solution, Take 0.63 mg by nebulization every 6 (six) hours as needed for wheezing or shortness of breath., Disp: , Rfl:  .  LORazepam (ATIVAN) 0.5 MG tablet, Take 1 tablet (0.5 mg total) by mouth at bedtime., Disp: 10 tablet, Rfl: 0 .  metoprolol tartrate (LOPRESSOR) 12.5 mg TABS tablet, Take 12.5 mg by mouth 2 (two) times daily., Disp: , Rfl:  .  naphazoline-pheniramine (NAPHCON-A) 0.025-0.3 % ophthalmic solution, Place 1 drop 3 (three) times daily into the left eye. , Disp: , Rfl:  .  nystatin (NYSTATIN) powder, Apply topically 2 (two) times daily., Disp: , Rfl:  .  omeprazole (PRILOSEC) 20 MG capsule, Take 20 mg by mouth daily., Disp: , Rfl:  .  oxybutynin (DITROPAN) 5 MG tablet, One twice daily to help bladder control, Disp: 60 tablet, Rfl: 5 .  OXYGEN, Inhale 2 L into the lungs as needed (to maintain 90% o2)., Disp: , Rfl:  .  phenol (SORE THROAT SPRAY) 1.4 % LIQD, Use as directed 1 spray every 8 (eight) hours as needed in the mouth or throat for throat irritation / pain. , Disp: , Rfl:  .  potassium chloride SA (K-DUR,KLOR-CON) 20 MEQ tablet, Take  40 mEq by mouth 2 (two) times daily., Disp: , Rfl:  .  saccharomyces boulardii (FLORASTOR) 250 MG capsule, Take 250 mg by mouth daily. , Disp: , Rfl:  .  sulfamethoxazole-trimethoprim (BACTRIM DS,SEPTRA DS) 800-160 MG tablet, Take 1 tablet by mouth every Monday, Wednesday, and Friday. , Disp: , Rfl:  .  warfarin (COUMADIN) 2 MG tablet, Take 2 mg by mouth one time only at 6 PM., Disp: , Rfl:

## 2017-07-18 NOTE — Patient Instructions (Signed)
Wegener's granulomatosis I agree with holding off on any further immunosuppression  Asthma: Continue Advair  Recent aspiration pneumonia: Continue speech therapy recommendations We will repeat a chest x-ray We will check blood work today to make sure that your kidney liver and blood function tests are all normal. We will check an oxygen level with ambulation today  In general I think the best approach is continued nutritional support with advice from speech therapy and physical therapy.  Follow-up 2-3 months or sooner if needed  I called her son-in-law today to give him an update.  He agrees with our plan of care.

## 2017-07-18 NOTE — Assessment & Plan Note (Addendum)
She stated she sleeps well at night, decrease Lorazepam 0.25mg /0.5mg  hs, phone call made to [place] 3:30pm 07/20/17, she consents the GDR.

## 2017-07-19 ENCOUNTER — Inpatient Hospital Stay: Payer: Medicare Other

## 2017-07-19 ENCOUNTER — Inpatient Hospital Stay: Payer: Medicare Other | Attending: Hematology & Oncology | Admitting: Hematology & Oncology

## 2017-07-19 VITALS — BP 116/64 | HR 117 | Temp 97.7°F | Resp 19 | Wt 114.0 lb

## 2017-07-19 DIAGNOSIS — R05 Cough: Secondary | ICD-10-CM | POA: Diagnosis not present

## 2017-07-19 DIAGNOSIS — Z7901 Long term (current) use of anticoagulants: Secondary | ICD-10-CM | POA: Insufficient documentation

## 2017-07-19 DIAGNOSIS — D509 Iron deficiency anemia, unspecified: Secondary | ICD-10-CM | POA: Diagnosis not present

## 2017-07-19 DIAGNOSIS — M3130 Wegener's granulomatosis without renal involvement: Secondary | ICD-10-CM

## 2017-07-19 DIAGNOSIS — Z79899 Other long term (current) drug therapy: Secondary | ICD-10-CM | POA: Insufficient documentation

## 2017-07-19 DIAGNOSIS — D500 Iron deficiency anemia secondary to blood loss (chronic): Secondary | ICD-10-CM

## 2017-07-19 LAB — CBC WITH DIFFERENTIAL (CANCER CENTER ONLY)
BASOS PCT: 0 %
Basophils Absolute: 0 10*3/uL (ref 0.0–0.1)
Eosinophils Absolute: 0 10*3/uL (ref 0.0–0.5)
Eosinophils Relative: 0 %
HEMATOCRIT: 37.7 % (ref 34.8–46.6)
Hemoglobin: 12.3 g/dL (ref 11.6–15.9)
LYMPHS ABS: 0.4 10*3/uL — AB (ref 0.9–3.3)
LYMPHS PCT: 4 %
MCH: 34.5 pg — AB (ref 26.0–34.0)
MCHC: 32.6 g/dL (ref 32.0–36.0)
MCV: 105.6 fL — AB (ref 81.0–101.0)
MONO ABS: 0.9 10*3/uL (ref 0.1–0.9)
MONOS PCT: 10 %
NEUTROS ABS: 7.8 10*3/uL — AB (ref 1.5–6.5)
Neutrophils Relative %: 86 %
Platelet Count: 355 10*3/uL (ref 145–400)
RBC: 3.57 MIL/uL — ABNORMAL LOW (ref 3.70–5.32)
RDW: 14.7 % (ref 11.1–15.7)
WBC Count: 9.1 10*3/uL (ref 3.9–10.3)

## 2017-07-19 LAB — CMP (CANCER CENTER ONLY)
ALT: 12 U/L (ref 0–55)
ANION GAP: 9 (ref 3–11)
AST: 26 U/L (ref 5–34)
Albumin: 2.6 g/dL — ABNORMAL LOW (ref 3.5–5.0)
Alkaline Phosphatase: 203 U/L — ABNORMAL HIGH (ref 40–150)
BILIRUBIN TOTAL: 0.3 mg/dL (ref 0.2–1.2)
BUN: 10 mg/dL (ref 7–26)
CALCIUM: 10.5 mg/dL — AB (ref 8.4–10.4)
CO2: 21 mmol/L — ABNORMAL LOW (ref 22–29)
Chloride: 106 mmol/L (ref 98–109)
Creatinine: 0.99 mg/dL (ref 0.60–1.10)
GFR, EST AFRICAN AMERICAN: 60 mL/min — AB (ref >60)
GFR, EST NON AFRICAN AMERICAN: 52 mL/min — AB (ref >60)
Glucose, Bld: 145 mg/dL — ABNORMAL HIGH (ref 70–140)
POTASSIUM: 4.8 mmol/L — AB (ref 3.3–4.7)
Sodium: 136 mmol/L (ref 136–145)
TOTAL PROTEIN: 7.1 g/dL (ref 6.4–8.3)

## 2017-07-19 LAB — RETICULOCYTES
RBC.: 3.59 MIL/uL — ABNORMAL LOW (ref 3.70–5.45)
RETIC CT PCT: 1.4 % (ref 0.7–2.1)
Retic Count, Absolute: 50.3 10*3/uL (ref 33.7–90.7)

## 2017-07-19 LAB — IRON AND TIBC
Iron: 31 ug/dL — ABNORMAL LOW (ref 41–142)
SATURATION RATIOS: 25 % (ref 21–57)
TIBC: 121 ug/dL — AB (ref 236–444)
UIBC: 90 ug/dL

## 2017-07-19 LAB — FERRITIN: FERRITIN: 1226 ng/mL — AB (ref 9–269)

## 2017-07-19 NOTE — Progress Notes (Signed)
Hematology and Oncology Follow Up Visit  [name] [id number] 05-20-1933 84 y.o. 07/19/2017   Principle Diagnosis:  Recurrent iron deficiency anemia Wegener's granulomatosis  Current Therapy:   IV iron as indicated - last received in June 22, 2017    Interim History:  [name] is here for a follow-up.  She comes in from the assisted living.  [facility]  She said that she has a cough.  She says she has had this for several weeks.  She was put on Bactrim 2 days ago.  She does have the Wegener's.  She is on Cytoxan.  Back in December, she did have IV iron.  At that time, her iron studies were trending downward.  Her ferritin was 513 with iron saturation of 25%.  Her serum iron was 39.  She has had no fever.  There is been no obvious bleeding.  There is been no change in bowel or bladder habits.  I think she is been eating okay.  It sounds like she had a decent Christmas.  She says she was at her daughter's house.    Currently, I would say that her performance status is ECOG 2.    Medications:  Allergies as of 07/19/2017      Reactions   Adhesive [tape] Rash   Caused a rash on lower extremities       Medication List        Accurate as of 07/19/17 10:56 AM. Always use your most recent med list.          acetaminophen 325 MG tablet Commonly known as:  TYLENOL Take 650 mg by mouth See admin instructions. 650 mg at bedtime every night and may take an additional 650 mg every 12 hours as needed for pain   CALCIUM 600-D 600-400 MG-UNIT Tabs Generic drug:  Calcium Carbonate-Vitamin D3 Take 1 tablet by mouth daily.   diclofenac sodium 1 % Gel Commonly known as:  VOLTAREN Apply 2 g topically 3 (three) times daily. Apply to left shoulder.   Fluticasone-Salmeterol 500-50 MCG/DOSE Aepb Commonly known as:  ADVAIR DISKUS INHALE 1 PUFF EVERY 12 HOURS.   folic acid 1 MG tablet Commonly known as:  FOLVITE Take 1 mg by mouth daily.   gabapentin 100  MG capsule Commonly known as:  NEURONTIN Take 100 mg by mouth 2 (two) times daily.   guaiFENesin 600 MG 12 hr tablet Commonly known as:  MUCINEX Take 600 mg by mouth every 12 (twelve) hours.   guaiFENesin-dextromethorphan 100-10 MG/5ML syrup Commonly known as:  ROBITUSSIN DM Take 5 mLs every 4 (four) hours as needed by mouth for cough.   hydrocortisone cream 1 % Apply 1 application topically 4 (four) times daily as needed for itching.   ipratropium 0.02 % nebulizer solution Commonly known as:  ATROVENT Take 2.5 mLs (0.5 mg total) 3 (three) times daily by nebulization.   latanoprost 0.005 % ophthalmic solution Commonly known as:  XALATAN Place 1 drop into both eyes at bedtime.   levalbuterol 0.63 MG/3ML nebulizer solution Commonly known as:  XOPENEX Take 0.63 mg by nebulization every 6 (six) hours as needed for wheezing or shortness of breath.   LORazepam 0.5 MG tablet Commonly known as:  ATIVAN Take 1 tablet (0.5 mg total) by mouth at bedtime.   metoprolol tartrate 12.5 mg Tabs tablet Commonly known as:  LOPRESSOR Take 12.5 mg by mouth 2 (two) times daily.   NAPHCON-A 0.025-0.3 % ophthalmic solution Generic drug:  naphazoline-pheniramine Place 1 drop 3 (three) times daily into the left eye.   nystatin powder Generic drug:  nystatin Apply topically 2 (two) times daily.   omeprazole 20 MG capsule Commonly known as:  PRILOSEC Take 20 mg by mouth daily.   oxybutynin 5 MG tablet Commonly known as:  DITROPAN One twice daily to help bladder control   OXYGEN Inhale 2 L into the lungs as needed (to maintain 90% o2).   potassium chloride SA 20 MEQ tablet Commonly known as:  K-DUR,KLOR-CON Take 40 mEq by mouth 2 (two) times daily.   saccharomyces boulardii 250 MG capsule Commonly known as:  FLORASTOR Take 250 mg by mouth daily.   SORE THROAT SPRAY 1.4 % Liqd Generic drug:  phenol Use as directed 1 spray every 8 (eight) hours as needed in the mouth or throat for  throat irritation / pain.   sulfamethoxazole-trimethoprim 800-160 MG tablet Commonly known as:  BACTRIM DS,SEPTRA DS Take 1 tablet by mouth every Monday, Wednesday, and Friday.   Vitamin D-3 1000 units Caps Take 1,000 Units by mouth daily.   warfarin 2 MG tablet Commonly known as:  COUMADIN Take 2 mg by mouth one time only at 6 PM.       Allergies:  Allergies  Allergen Reactions  . Adhesive [Tape] Rash    Caused a rash on lower extremities     Past Medical History, Surgical history, Social history, and Family History were reviewed and updated.  Review of Systems: As stated in the interim history  Physical Exam:  weight is 114 lb (51.7 kg). Her oral temperature is 97.7 F (36.5 C). Her blood pressure is 116/64 and her pulse is 117 (abnormal). Her respiration is 19 and oxygen saturation is 98%.   Wt Readings from Last 3 Encounters:  07/19/17 114 lb (51.7 kg)  07/17/17 117 lb 4.8 oz (53.2 kg)  07/13/17 117 lb 4.8 oz (53.2 kg)    Physical Exam  Constitutional: She is oriented to person, place, and time.  Chronically ill-appearing female.  She is thin but not malnourished.  HENT:  Head: Normocephalic and atraumatic.  Mouth/Throat: Oropharynx is clear and moist.  Eyes: EOM are normal. Pupils are equal, round, and reactive to light.  She has bilateral erythema of her conjunctiva.  There is some scant clear discharge from both eyes.  Neck: Normal range of motion.  Cardiovascular: Normal rate, regular rhythm and normal heart sounds.  Pulmonary/Chest: Effort normal and breath sounds normal.  Abdominal: Soft. Bowel sounds are normal.  Musculoskeletal: Normal range of motion. She exhibits no edema, tenderness or deformity.  She has osteoarthritic changes in her joints.  She has muscle atrophy in upper and lower extremities.  Lymphadenopathy:    She has no cervical adenopathy.  Neurological: She is alert and oriented to person, place, and time.  Skin: Skin is warm and dry.  No rash noted. No erythema.  Psychiatric: She has a normal mood and affect. Her behavior is normal. Judgment and thought content normal.  Vitals reviewed.    Lab Results  Component Value Date   WBC 9.1 07/19/2017   HGB 12.9 07/18/2017   HCT 37.7 07/19/2017   MCV 105.6 (H) 07/19/2017   PLT 355 07/19/2017   Lab Results  Component Value Date   FERRITIN 513 (H) 06/21/2017   IRON 39 (L) 06/21/2017   TIBC 153 (L) 06/21/2017   UIBC 114 (L) 06/21/2017   IRONPCTSAT 25 06/21/2017   Lab Results  Component Value Date  RETICCTPCT 1.6 02/03/2015   RBC 3.57 (L) 07/19/2017   RETICCTABS 62.4 02/03/2015   No results found for: KPAFRELGTCHN, LAMBDASER, KAPLAMBRATIO No results found for: IGGSERUM, IGA, IGMSERUM No results found for: Odetta Pink, SPEI   Chemistry      Component Value Date/Time   NA 135 07/18/2017 1448   NA 137 07/16/2017   NA 143 06/21/2017 1105   NA 137 05/10/2017 1142   K 4.6 07/18/2017 1448   K 4.1 06/21/2017 1105   K 3.9 05/10/2017 1142   CL 103 07/18/2017 1448   CL 107 06/21/2017 1105   CO2 24 07/18/2017 1448   CO2 26 06/21/2017 1105   CO2 22 05/10/2017 1142   BUN 10 07/18/2017 1448   BUN 10 07/16/2017   BUN 6 (L) 06/21/2017 1105   BUN 12.5 05/10/2017 1142   CREATININE 0.77 07/18/2017 1448   CREATININE 1.1 06/21/2017 1105   CREATININE 1.1 05/10/2017 1142   GLU 128 07/16/2017      Component Value Date/Time   CALCIUM 10.2 07/18/2017 1448   CALCIUM 9.9 06/21/2017 1105   CALCIUM 10.1 05/10/2017 1142   ALKPHOS 169 (A) 07/16/2017   ALKPHOS 124 (H) 06/21/2017 1105   ALKPHOS 150 05/10/2017 1142   AST 19 07/16/2017   AST 28 06/21/2017 1105   AST 33 05/10/2017 1142   ALT 10 07/16/2017   ALT 21 06/21/2017 1105   ALT 24 05/10/2017 1142   BILITOT 0.50 06/21/2017 1105   BILITOT 0.46 05/10/2017 1142     Impression and Plan: Ms. Lampley is an 84 year old white female. She has Wegener's granulomatosis. She  has iron deficiency anemia.  She got iron back in December.  Hopefully this has helped.  I told her that she is on antibiotic for this cough.  She is on Bactrim.  She also has some cough medicine that she can take.  I think we can try to get her through the winter.  We will see about getting her back in April.  I do not see any problems with her being on the Cytoxan for her Wegener's.  She is on Coumadin.  This definitely will be her biggest risk factor for bleeding.  Volanda Napoleon, MD 1/17/201910:56 AM

## 2017-07-23 ENCOUNTER — Encounter: Payer: Self-pay | Admitting: Nurse Practitioner

## 2017-07-23 ENCOUNTER — Non-Acute Institutional Stay: Payer: Medicare Other | Admitting: Nurse Practitioner

## 2017-07-23 DIAGNOSIS — Z7189 Other specified counseling: Secondary | ICD-10-CM

## 2017-07-23 DIAGNOSIS — I471 Supraventricular tachycardia: Secondary | ICD-10-CM | POA: Diagnosis not present

## 2017-07-23 DIAGNOSIS — R509 Fever, unspecified: Secondary | ICD-10-CM

## 2017-07-23 DIAGNOSIS — H5789 Other specified disorders of eye and adnexa: Secondary | ICD-10-CM | POA: Insufficient documentation

## 2017-07-23 DIAGNOSIS — M3130 Wegener's granulomatosis without renal involvement: Secondary | ICD-10-CM

## 2017-07-23 DIAGNOSIS — M25512 Pain in left shoulder: Secondary | ICD-10-CM

## 2017-07-23 NOTE — Assessment & Plan Note (Signed)
left eye redness, watery, recurrent, she denied pain or itching in her left eye. 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 F/u Ophthalmology.

## 2017-07-23 NOTE — Assessment & Plan Note (Addendum)
low grade temperature, cough, nausea, vomiting, generalized weakness for 2 days. She denied chest pain, palpitation, abd pain, dysuria, she has urinary leakage, her appetite remains poor. Saw Dr. [facility] 07/18/17. Granulomatosis with polyangiitis under care of Rheumatology in the past, she is off Prednisone, MTX, Cytoxan. POA declined F/u Pulmonology and Rheumatology and CXR, will obtain CBC/diff, BMP

## 2017-07-23 NOTE — Progress Notes (Signed)
Location:  [facility] Room Number: 026 Place of Service:  ALF [id number]) Provider:  Lennie Odor Ravon Mortellaro NP  Blanchie Serve, MD  [name], MD as PCP - General (Internal Medicine) Unice Bailey, MD as Consulting Physician (Rheumatology) Rozetta Nunnery, MD as Consulting Physician (Otolaryngology) Marcial Pacas, MD as Consulting Physician (Neurology) Michel Bickers, MD as Consulting Physician (Infectious Diseases) Marin Olp Rudell Cobb, MD as Consulting Physician (Oncology) Juanito Doom, MD as Consulting Physician (Pulmonary Disease) Tanda Rockers, MD as Consulting Physician (Pulmonary Disease) Estill Dooms, MD as Consulting Physician (Geriatric Medicine) Coralie Keens, MD as Consulting Physician (General Surgery) Rema Lievanos X, NP as Nurse Practitioner (Nurse Practitioner)  Extended Emergency Contact Information Primary Emergency Contact: [facility] Address: [place], [place] [address] [place] L[place] of [place] Phone: [phone] Mobile Phone: [phone] Relation: Daughter Secondary Emergency Contact: Greeley,[name] Address: [address] [place]          [place], [place] [address] [place] Phone: [phone] Mobile Phone: [phone] Relation: Son  Code Status:  Full Code Goals of care: Advanced Directive information Advanced Directives 07/23/2017  Does Patient Have a Medical Advance Directive? Yes  Type of Advance Directive Living will;Healthcare Power of Attorney  Does patient want to make changes to medical advance directive? No - Patient declined  Copy of [place] in Chart? Yes  Would patient like information on creating a medical advance directive? -  Pre-existing out of facility DNR order (yellow form or pink MOST form) -     Chief Complaint  Patient presents with  . Acute Visit    (L) eye red, low grade temp.    HPI:  Pt is a 84 y.o.  female seen today for an acute visit for left eye redness, low grade temperature, cough, nausea, vomiting, generalized weakness for 2 days. She denied chest pain, palpitation, abd pain, dysuria, she has urinary leakage, her appetite remains poor, she takes Metoprolol 12.12m bid po for multiple focal tachycardia, HR 120 at rest.   She has history IDA, saw Dr EMarin Olp1/17/19, Dyspnea, saw Dr. [facility] 07/18/17. Granulomatosis with polyangiitis under care of Rheumatology, she is off Prednisone, MTX, Cytoxan, on Septra DS 3x/week. 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   The patient's daughter POA SDrexel Ihafor goals of care disscussion    Past Medical History:  Diagnosis Date  . Acute respiratory failure with hypoxia ([facility] [place]   . Anemia   . Aspergillosis ([facility]   . Bladder incontinence   . Blindness of one eye   . Cancer ([facility]    melanoma   (chemo)  . Chronic sinusitis   . Clotting disorder ([facility]   . Difficult intubation    Difficult airway with intubation for ARF 07/22/14 (due to anterior larynx, also had mucous plug)  . Fatigue 12/09/2015  . Glaucoma   . Hearing loss in left ear    hearing aid both ears  . History of pulmonary embolism 1997  . Interstitial emphysema ([facility]   . Left shoulder pain 12/09/2015  . Multiple allergies   . Neuropathy   . OA (osteoarthritis)   . Pneumonia 12/15  . Shortness of breath dyspnea   . Stroke ([facility]    ??? blind left eye  . Wegener's granulomatosis ([facility]    Past  Surgical History:  Procedure Laterality Date  . bronchosocpy     pt. states she has had over 10 in last 20 yrs.  Marland Kitchen CATARACT EXTRACTION    . COCHLEAR IMPLANT Left 10/27/2015   Procedure: LEFT COCHLEAR IMPLANT;  Surgeon: Leta Baptist, MD;  Location: [facility];  Service: ENT;  Laterality: Left;  . INGUINAL HERNIA REPAIR Left 02/17/2014   Procedure: LEFT INGUINAL HERNIA REPAIR WITH MESH;  Surgeon: Harl Bowie, MD;   Location: [facility];  Service: General;  Laterality: Left;  . INSERTION OF MESH N/A 02/17/2014   Procedure: INSERTION OF MESH;  Surgeon: Harl Bowie, MD;  Location: [facility];  Service: General;  Laterality: N/A;  . KNEE ARTHROSCOPY  2006   rt   . MELANOMA EXCISION     left leg  . PUBOVAGINAL SLING    . TEAR DUCT PROBING    . TUBAL LIGATION    . TYMPANOMASTOIDECTOMY Left 04/28/2015  . TYMPANOMASTOIDECTOMY Left 04/28/2015   Procedure: LEFT TYMPANOMASTOIDECTOMY;  Surgeon: Leta Baptist, MD;  Location: [facility];  Service: ENT;  Laterality: Left;  Marland Kitchen VENA CAVA FILTER PLACEMENT  1997   [place] filter    Allergies  Allergen Reactions  . Adhesive [Tape] Rash    Caused a rash on lower extremities     Outpatient Encounter Medications as of 07/23/2017  Medication Sig  . acetaminophen (TYLENOL) 325 MG tablet Take 650 mg by mouth See admin instructions. 650 mg at bedtime every night and may take an additional 650 mg every 12 hours as needed for pain  . antiseptic oral rinse (BIOTENE) LIQD 15 mLs by Mouth Rinse route as needed for dry mouth.  . Calcium Carbonate-Vitamin D3 (CALCIUM 600-D) 600-400 MG-UNIT TABS Take 1 tablet by mouth daily.  . Cholecalciferol (VITAMIN D-3) 1000 units CAPS Take 1,000 Units by mouth daily.  . diclofenac sodium (VOLTAREN) 1 % GEL Apply 2 g topically 3 (three) times daily. Apply to left shoulder.  . Fluticasone-Salmeterol (ADVAIR DISKUS) 500-50 MCG/DOSE AEPB INHALE 1 PUFF EVERY 12 HOURS.  . folic acid (FOLVITE) 1 MG tablet Take 1 mg by mouth daily.   Marland Kitchen gabapentin (NEURONTIN) 100 MG capsule Take 100 mg by mouth 2 (two) times daily.   Marland Kitchen guaiFENesin (MUCINEX) 600 MG 12 hr tablet Take 600 mg by mouth every 12 (twelve) hours.   Marland Kitchen guaiFENesin-dextromethorphan (ROBITUSSIN DM) 100-10 MG/5ML syrup Take 5 mLs every 4 (four) hours as needed by mouth for cough.  . hydrocortisone cream 1 % Apply 1 application topically 4 (four) times daily as needed  for itching.   Marland Kitchen ipratropium (ATROVENT) 0.02 % nebulizer solution Take 2.5 mLs (0.5 mg total) 3 (three) times daily by nebulization.  Marland Kitchen latanoprost (XALATAN) 0.005 % ophthalmic solution Place 1 drop into both eyes at bedtime.   . levalbuterol (XOPENEX) 0.63 MG/3ML nebulizer solution Take 0.63 mg by nebulization every 6 (six) hours as needed for wheezing or shortness of breath.  Marland Kitchen LORazepam (ATIVAN) 0.5 MG tablet Take 1 tablet (0.5 mg total) by mouth at bedtime. (Patient taking differently: Take 0.25 mg by mouth at bedtime. )  . metoprolol tartrate (LOPRESSOR) 12.5 mg TABS tablet Take 12.5 mg by mouth 2 (two) times daily.  . naphazoline-pheniramine (NAPHCON-A) 0.025-0.3 % ophthalmic solution Place 1 drop 3 (three) times daily into the left eye.   . nystatin (NYSTATIN) powder Apply topically 2 (two) times daily.  Marland Kitchen omeprazole (PRILOSEC) 20 MG capsule Take 20 mg by mouth daily.  Marland Kitchen  OXYGEN Inhale 2 L into the lungs as needed (to maintain 90% o2).  . phenol (SORE THROAT SPRAY) 1.4 % LIQD Use as directed 1 spray every 8 (eight) hours as needed in the mouth or throat for throat irritation / pain.   . polyvinyl alcohol (LIQUIFILM TEARS) 1.4 % ophthalmic solution Place 1 drop into both eyes 4 (four) times daily.  . potassium chloride SA (K-DUR,KLOR-CON) 20 MEQ tablet Take 40 mEq by mouth 2 (two) times daily.  Marland Kitchen saccharomyces boulardii (FLORASTOR) 250 MG capsule Take 250 mg by mouth daily.   Marland Kitchen sulfamethoxazole-trimethoprim (BACTRIM DS,SEPTRA DS) 800-160 MG tablet Take 1 tablet by mouth every Monday, Wednesday, and Friday.   . warfarin (COUMADIN) 2 MG tablet Take 2 mg by mouth one time only at 6 PM.  . [DISCONTINUED] oxybutynin (DITROPAN) 5 MG tablet One twice daily to help bladder control   No facility-administered encounter medications on file as of 07/23/2017.     Review of Systems  Constitutional: Positive for activity change, appetite change, fatigue and fever. Negative for chills and diaphoresis.    HENT: Positive for hearing loss and trouble swallowing. Negative for congestion, sore throat and voice change.   Eyes: Positive for redness and visual disturbance.       Left eye redness, blind left eye  Respiratory: Positive for cough and shortness of breath. Negative for chest tightness and wheezing.   Cardiovascular: Negative for chest pain and leg swelling.  Gastrointestinal: Negative for abdominal distention, abdominal pain, constipation, diarrhea, nausea and vomiting.  Endocrine: Negative for cold intolerance.  Genitourinary: Negative for difficulty urinating, dysuria, frequency and urgency.       Incontinent of urine.   Musculoskeletal: Positive for arthralgias and gait problem.       Left shoulder pain with overhead ROM  Skin: Negative for color change, pallor, rash and wound.  Allergic/Immunologic: Negative for immunocompromised state.  Neurological: Positive for dizziness. Negative for tremors, speech difficulty, weakness and headaches.       When sitting up  Psychiatric/Behavioral: Positive for confusion. Negative for agitation, behavioral problems, hallucinations and sleep disturbance. The patient is not nervous/anxious.     Immunization History  Administered Date(s) Administered  . Influenza Split 03/04/2012, 03/11/2014, 04/01/2015  . Influenza,inj,Quad PF,6+ Mos 03/03/2013  . Influenza-Unspecified 04/01/2015, 04/19/2016, 04/23/2017  . PPD Test 05/25/2015  . Pneumococcal Conjugate-13 07/01/2015  . Pneumococcal Polysaccharide-23 09/02/2015  . Zoster 09/30/1994   Pertinent  Health Maintenance Due  Topic Date Due  . INFLUENZA VACCINE  Completed  . DEXA SCAN  Completed  . PNA vac Low Risk Adult  Completed   Fall Risk  03/20/2017 12/09/2015 09/23/2015 09/09/2015 02/03/2015  Falls in the past year? No No Yes Yes Yes  Number falls in past yr: - - 2 or more 2 or more 2 or more  Comment - - - - -  Injury with Fall? - - No No -   Functional Status Survey:    Vitals:    07/23/17 1325  BP: 110/60  Pulse: (!) 112  Resp: 20  Temp: 99.1 F (37.3 C)  SpO2: 95%  Weight: 114 lb (51.7 kg)  Height: 5' 1"  (1.549 m)   Body mass index is 21.54 kg/m. Physical Exam  Constitutional: She appears well-developed and well-nourished. No distress.  HENT:  Head: Normocephalic and atraumatic.  Mouth/Throat: No oropharyngeal exudate.  Eyes: EOM are normal. Pupils are equal, round, and reactive to light. Left eye exhibits discharge. No scleral icterus.  Left eye blind, watery,  mild conjunctival redness  Neck: Normal range of motion. Neck supple. No JVD present. No thyromegaly present.  Cardiovascular: Regular rhythm.  No murmur heard. HR 120 bpm  Pulmonary/Chest: Effort normal. She exhibits no tenderness.  Scattered coarse rhonchi  Abdominal: Soft. Bowel sounds are normal. She exhibits no distension. There is no tenderness. There is no rebound and no guarding.  Musculoskeletal: She exhibits tenderness. She exhibits no edema.  Left shoulder pain with overhead ROM  Neurological: She exhibits normal muscle tone. Coordination normal.  Skin: Skin is warm and dry. No rash noted. She is not diaphoretic. No erythema.  Psychiatric: She has a normal mood and affect. Her behavior is normal.    Labs reviewed: Recent Labs    06/21/17 1105  07/16/17 07/18/17 1448 07/19/17 0959 07/24/17  NA 143   < > 137 135 136 137  K 4.1   < > 4.2 4.6 4.8* 4.5  CL 107  --   --  103 106  --   CO2 26  --   --  24 21*  --   GLUCOSE 146*  --   --  133* 145*  --   BUN 6*   < > 10 10 10 12   CREATININE 1.1   < > 0.9 0.77  --  0.7  CALCIUM 9.9  --   --  10.2 10.5*  --    < > = values in this interval not displayed.   Recent Labs    06/05/17 1850  06/21/17 1105 06/28/17 07/16/17 07/19/17 0959  AST 39   < > 28 23 19 26   ALT 23   < > 21 10 10 12   ALKPHOS 129*   < > 124* 131* 169* 203*  BILITOT 0.5  --  0.50  --   --  0.3  PROT 6.0*  --  6.9  --   --  7.1  ALBUMIN 3.0*  --  3.0*  --   --   2.6*   < > = values in this interval not displayed.   Recent Labs    06/21/17 1105  07/16/17 07/18/17 1448 07/19/17 0959 07/24/17  WBC 4.4   < >  --  7.9 A Manual Differential was performed and is consistent with the Automated Differential. 9.1 10.2  NEUTROABS 3.4  --   --  6.4 7.8*  --   HGB 11.2*   < > 11.5* 12.9  --  10.7*  HCT 34.1*   < > 34* 39.5 37.7 30*  MCV 111*  --   --  106.3* 105.6*  --   PLT 328   < > 314 421.0* 355 381   < > = values in this interval not displayed.   Lab Results  Component Value Date   TSH 1.160 06/06/2017   Lab Results  Component Value Date   HGBA1C 5.8 (H) 07/28/2014   No results found for: CHOL, HDL, LDLCALC, LDLDIRECT, TRIG, CHOLHDL  Significant Diagnostic Results in last 30 days:  Dg Chest 2 View  Result Date: 07/19/2017 CLINICAL DATA:  Routine examination. No current complaints. History of emphysema, former smoker. EXAM: CHEST  2 VIEW COMPARISON:  Chest x-ray of June 07, 2017 FINDINGS: The lungs are well-expanded. There is no focal infiltrate. There is no pleural effusion. The heart and pulmonary vascularity are normal. The mediastinum is normal in width. There calcification in the wall of the aortic arch the bony thorax exhibits no acute abnormality. IMPRESSION: There is no active cardiopulmonary disease. Thoracic  aortic atherosclerosis. Electronically Signed   By: David  Martinique M.D.   On: 07/19/2017 07:47    Assessment/Plan Redness of left eye left eye redness, watery, recurrent, she denied pain or itching in her left eye. 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 F/u Ophthalmology.     WEGENERS GRANULOMATOSIS low grade temperature, cough, nausea, vomiting, generalized weakness for 2 days. She denied chest pain, palpitation, abd pain, dysuria, she has urinary leakage, her appetite remains poor. Saw Dr. [facility] 07/18/17. Granulomatosis with polyangiitis under care  of Rheumatology in the past, she is off Prednisone, MTX, Cytoxan. POA declined F/u Pulmonology and Rheumatology and CXR, will obtain CBC/diff, BMP  Paroxysmal supraventricular tachycardia ([place]) POA desires to stop Metoprolol 12.39m bid po for paroxysmal supraventricular tachycardia, no efficacy presently, her HR 120 at rest    Advanced care planning/counseling discussion Goals of care discussion: reviewed goals of care with the patient, her POA daughter, SDrexel Iha her husband and charge nurse also present. DNR reviewed, went over and filled out MOST form between 3:45pm to 4:10pm. The patient would like to continue to be DNR. She would like to be comfort measures, no hospitalization unless comfort needs cannot be met in current location. Determine use or limitation of antibiotics only for UTI. No IVF and feeding tube, but she still like to know her hydration status and renal function presently. Form signed by the HPOA daughter SDrexel Iha charge nurese, myself. Copies made for family. Also reviewed with the HPOA her current medications: stopped Ca, Vit D, Metoprolol, will obtain CBC/diff, may obtain UA C/S in setting of fever and leukocytosis, treat with antibiotics as indicated. Hospice referral made today.   Left shoulder pain Chronic issue, pain is reproduced with ROM of overhead, PT to eval and treat as indicated.   Fever The has has low grade temperature, baseline confusion, poor appetite, getting weaker, pending CBC/diff, obtain UA C/S for infectious process workup in setting of her MOST's antibiotics for UTI only.      Family/ staff Communication: plan of care reviewed with the patient, the patient's POA, and charge nurse.   Labs/tests ordered:  CBC/diff, BMP, UA C/S  Time spend 25 minutes with the patient, 25 minutes for advance care plan discussion.

## 2017-07-23 NOTE — Assessment & Plan Note (Signed)
Goals of care discussion: reviewed goals of care with the patient, her POA daughter, Drexel Iha, her husband and charge nurse also present. DNR reviewed, went over and filled out MOST form between 3:45pm to 4:10pm. The patient would like to continue to be DNR. She would like to be comfort measures, no hospitalization unless comfort needs cannot be met in current location. Determine use or limitation of antibiotics only for UTI. No IVF and feeding tube, but she still like to know her hydration status and renal function presently. Form signed by the HPOA daughter Drexel Iha, charge nurese, myself. Copies made for family. Also reviewed with the HPOA her current medications: stopped Ca, Vit D, Metoprolol, will obtain CBC/diff, may obtain UA C/S in setting of fever and leukocytosis, treat with antibiotics as indicated. Hospice referral made today.

## 2017-07-23 NOTE — Assessment & Plan Note (Addendum)
POA desires to stop Metoprolol 12.5mg  bid po for paroxysmal supraventricular tachycardia, no efficacy presently, her HR 120 at rest

## 2017-07-24 ENCOUNTER — Encounter: Payer: Self-pay | Admitting: Internal Medicine

## 2017-07-24 DIAGNOSIS — J45909 Unspecified asthma, uncomplicated: Secondary | ICD-10-CM | POA: Diagnosis not present

## 2017-07-24 DIAGNOSIS — J309 Allergic rhinitis, unspecified: Secondary | ICD-10-CM | POA: Diagnosis not present

## 2017-07-24 DIAGNOSIS — G9009 Other idiopathic peripheral autonomic neuropathy: Secondary | ICD-10-CM | POA: Diagnosis not present

## 2017-07-24 DIAGNOSIS — R1310 Dysphagia, unspecified: Secondary | ICD-10-CM | POA: Diagnosis not present

## 2017-07-24 DIAGNOSIS — F411 Generalized anxiety disorder: Secondary | ICD-10-CM | POA: Diagnosis not present

## 2017-07-24 DIAGNOSIS — I679 Cerebrovascular disease, unspecified: Secondary | ICD-10-CM | POA: Diagnosis not present

## 2017-07-24 DIAGNOSIS — Z86711 Personal history of pulmonary embolism: Secondary | ICD-10-CM | POA: Diagnosis not present

## 2017-07-24 DIAGNOSIS — I471 Supraventricular tachycardia: Secondary | ICD-10-CM | POA: Diagnosis not present

## 2017-07-24 DIAGNOSIS — M3131 Wegener's granulomatosis with renal involvement: Secondary | ICD-10-CM | POA: Diagnosis not present

## 2017-07-24 DIAGNOSIS — H409 Unspecified glaucoma: Secondary | ICD-10-CM | POA: Diagnosis not present

## 2017-07-24 DIAGNOSIS — K219 Gastro-esophageal reflux disease without esophagitis: Secondary | ICD-10-CM | POA: Diagnosis not present

## 2017-07-24 DIAGNOSIS — Z86718 Personal history of other venous thrombosis and embolism: Secondary | ICD-10-CM | POA: Diagnosis not present

## 2017-07-24 DIAGNOSIS — F339 Major depressive disorder, recurrent, unspecified: Secondary | ICD-10-CM | POA: Diagnosis not present

## 2017-07-24 DIAGNOSIS — N183 Chronic kidney disease, stage 3 (moderate): Secondary | ICD-10-CM | POA: Diagnosis not present

## 2017-07-24 DIAGNOSIS — Z85820 Personal history of malignant melanoma of skin: Secondary | ICD-10-CM | POA: Diagnosis not present

## 2017-07-24 DIAGNOSIS — I509 Heart failure, unspecified: Secondary | ICD-10-CM | POA: Diagnosis not present

## 2017-07-24 DIAGNOSIS — N3281 Overactive bladder: Secondary | ICD-10-CM | POA: Diagnosis not present

## 2017-07-24 LAB — BASIC METABOLIC PANEL
BUN: 12 (ref 4–21)
CREATININE: 0.7 (ref <=1.1)
Glucose: 98
POTASSIUM: 4.5 (ref 3.4–5.3)
SODIUM: 137 (ref 137–147)

## 2017-07-24 LAB — CBC AND DIFFERENTIAL
HCT: 30 — AB (ref 36–46)
Hemoglobin: 10.7 — AB (ref 12.0–16.0)
Platelets: 381 (ref 150–399)
WBC: 10.2

## 2017-07-24 NOTE — Assessment & Plan Note (Signed)
Chronic issue, pain is reproduced with ROM of overhead, PT to eval and treat as indicated.

## 2017-07-24 NOTE — Assessment & Plan Note (Signed)
The has has low grade temperature, baseline confusion, poor appetite, getting weaker, pending CBC/diff, obtain UA C/S for infectious process workup in setting of her MOST's antibiotics for UTI only.

## 2017-07-25 ENCOUNTER — Encounter: Payer: Self-pay | Admitting: Nurse Practitioner

## 2017-07-25 ENCOUNTER — Other Ambulatory Visit: Payer: Self-pay | Admitting: *Deleted

## 2017-07-25 DIAGNOSIS — N183 Chronic kidney disease, stage 3 (moderate): Secondary | ICD-10-CM | POA: Diagnosis not present

## 2017-07-25 DIAGNOSIS — I471 Supraventricular tachycardia: Secondary | ICD-10-CM | POA: Diagnosis not present

## 2017-07-25 DIAGNOSIS — I679 Cerebrovascular disease, unspecified: Secondary | ICD-10-CM | POA: Diagnosis not present

## 2017-07-25 DIAGNOSIS — R627 Adult failure to thrive: Secondary | ICD-10-CM | POA: Insufficient documentation

## 2017-07-25 DIAGNOSIS — I509 Heart failure, unspecified: Secondary | ICD-10-CM | POA: Diagnosis not present

## 2017-07-25 DIAGNOSIS — J45909 Unspecified asthma, uncomplicated: Secondary | ICD-10-CM | POA: Diagnosis not present

## 2017-07-25 DIAGNOSIS — M3131 Wegener's granulomatosis with renal involvement: Secondary | ICD-10-CM | POA: Diagnosis not present

## 2017-07-26 NOTE — Patient Instructions (Signed)
Opened in error

## 2017-07-26 NOTE — Progress Notes (Signed)
Opened in error

## 2017-07-27 ENCOUNTER — Ambulatory Visit: Payer: Medicare Other | Admitting: Pulmonary Disease

## 2017-07-30 DIAGNOSIS — I679 Cerebrovascular disease, unspecified: Secondary | ICD-10-CM | POA: Diagnosis not present

## 2017-07-30 DIAGNOSIS — I509 Heart failure, unspecified: Secondary | ICD-10-CM | POA: Diagnosis not present

## 2017-07-30 DIAGNOSIS — N183 Chronic kidney disease, stage 3 (moderate): Secondary | ICD-10-CM | POA: Diagnosis not present

## 2017-07-30 DIAGNOSIS — M3131 Wegener's granulomatosis with renal involvement: Secondary | ICD-10-CM | POA: Diagnosis not present

## 2017-07-30 DIAGNOSIS — J45909 Unspecified asthma, uncomplicated: Secondary | ICD-10-CM | POA: Diagnosis not present

## 2017-07-30 DIAGNOSIS — I471 Supraventricular tachycardia: Secondary | ICD-10-CM | POA: Diagnosis not present

## 2017-07-31 DIAGNOSIS — I679 Cerebrovascular disease, unspecified: Secondary | ICD-10-CM | POA: Diagnosis not present

## 2017-07-31 DIAGNOSIS — M3131 Wegener's granulomatosis with renal involvement: Secondary | ICD-10-CM | POA: Diagnosis not present

## 2017-07-31 DIAGNOSIS — N183 Chronic kidney disease, stage 3 (moderate): Secondary | ICD-10-CM | POA: Diagnosis not present

## 2017-07-31 DIAGNOSIS — I509 Heart failure, unspecified: Secondary | ICD-10-CM | POA: Diagnosis not present

## 2017-07-31 DIAGNOSIS — I471 Supraventricular tachycardia: Secondary | ICD-10-CM | POA: Diagnosis not present

## 2017-07-31 DIAGNOSIS — J45909 Unspecified asthma, uncomplicated: Secondary | ICD-10-CM | POA: Diagnosis not present

## 2017-08-02 ENCOUNTER — Other Ambulatory Visit: Payer: Self-pay | Admitting: *Deleted

## 2017-08-02 DIAGNOSIS — N183 Chronic kidney disease, stage 3 (moderate): Secondary | ICD-10-CM | POA: Diagnosis not present

## 2017-08-02 DIAGNOSIS — I679 Cerebrovascular disease, unspecified: Secondary | ICD-10-CM | POA: Diagnosis not present

## 2017-08-02 DIAGNOSIS — M3131 Wegener's granulomatosis with renal involvement: Secondary | ICD-10-CM | POA: Diagnosis not present

## 2017-08-02 DIAGNOSIS — I471 Supraventricular tachycardia: Secondary | ICD-10-CM | POA: Diagnosis not present

## 2017-08-02 DIAGNOSIS — I509 Heart failure, unspecified: Secondary | ICD-10-CM | POA: Diagnosis not present

## 2017-08-02 DIAGNOSIS — J45909 Unspecified asthma, uncomplicated: Secondary | ICD-10-CM | POA: Diagnosis not present

## 2017-08-02 LAB — HEPATIC FUNCTION PANEL
ALT: 11 (ref 7–35)
AST: 16 (ref 13–35)
Alkaline Phosphatase: 179 — AB (ref 25–125)
Bilirubin, Total: 0.3

## 2017-08-02 LAB — BASIC METABOLIC PANEL
BUN: 11 (ref 4–21)
CREATININE: 0.8 (ref <=1.1)
Glucose: 66
POTASSIUM: 5 (ref 3.4–5.3)
Sodium: 131 — AB (ref 137–147)

## 2017-08-02 LAB — POCT INR: INR: 3.2 — AB (ref <=1.1)

## 2017-08-02 LAB — PROTIME-INR: Protime: 33.9 — AB (ref 10.0–13.8)

## 2017-08-02 LAB — CBC AND DIFFERENTIAL
HCT: 30 — AB (ref 36–46)
Hemoglobin: 10.5 — AB (ref 12.0–16.0)
Platelets: 342 (ref 150–399)
WBC: 6.7

## 2017-08-03 DIAGNOSIS — J45909 Unspecified asthma, uncomplicated: Secondary | ICD-10-CM | POA: Diagnosis not present

## 2017-08-03 DIAGNOSIS — M3131 Wegener's granulomatosis with renal involvement: Secondary | ICD-10-CM | POA: Diagnosis not present

## 2017-08-03 DIAGNOSIS — G9009 Other idiopathic peripheral autonomic neuropathy: Secondary | ICD-10-CM | POA: Diagnosis not present

## 2017-08-03 DIAGNOSIS — K219 Gastro-esophageal reflux disease without esophagitis: Secondary | ICD-10-CM | POA: Diagnosis not present

## 2017-08-03 DIAGNOSIS — M25522 Pain in left elbow: Secondary | ICD-10-CM | POA: Diagnosis not present

## 2017-08-03 DIAGNOSIS — F339 Major depressive disorder, recurrent, unspecified: Secondary | ICD-10-CM | POA: Diagnosis not present

## 2017-08-03 DIAGNOSIS — Z85820 Personal history of malignant melanoma of skin: Secondary | ICD-10-CM | POA: Diagnosis not present

## 2017-08-03 DIAGNOSIS — I679 Cerebrovascular disease, unspecified: Secondary | ICD-10-CM | POA: Diagnosis not present

## 2017-08-03 DIAGNOSIS — Z86711 Personal history of pulmonary embolism: Secondary | ICD-10-CM | POA: Diagnosis not present

## 2017-08-03 DIAGNOSIS — J309 Allergic rhinitis, unspecified: Secondary | ICD-10-CM | POA: Diagnosis not present

## 2017-08-03 DIAGNOSIS — H409 Unspecified glaucoma: Secondary | ICD-10-CM | POA: Diagnosis not present

## 2017-08-03 DIAGNOSIS — M6281 Muscle weakness (generalized): Secondary | ICD-10-CM | POA: Diagnosis not present

## 2017-08-03 DIAGNOSIS — N183 Chronic kidney disease, stage 3 (moderate): Secondary | ICD-10-CM | POA: Diagnosis not present

## 2017-08-03 DIAGNOSIS — M25512 Pain in left shoulder: Secondary | ICD-10-CM | POA: Diagnosis not present

## 2017-08-03 DIAGNOSIS — F411 Generalized anxiety disorder: Secondary | ICD-10-CM | POA: Diagnosis not present

## 2017-08-03 DIAGNOSIS — N3281 Overactive bladder: Secondary | ICD-10-CM | POA: Diagnosis not present

## 2017-08-03 DIAGNOSIS — I509 Heart failure, unspecified: Secondary | ICD-10-CM | POA: Diagnosis not present

## 2017-08-03 DIAGNOSIS — Z86718 Personal history of other venous thrombosis and embolism: Secondary | ICD-10-CM | POA: Diagnosis not present

## 2017-08-03 DIAGNOSIS — I471 Supraventricular tachycardia: Secondary | ICD-10-CM | POA: Diagnosis not present

## 2017-08-03 DIAGNOSIS — R1310 Dysphagia, unspecified: Secondary | ICD-10-CM | POA: Diagnosis not present

## 2017-08-07 DIAGNOSIS — M25522 Pain in left elbow: Secondary | ICD-10-CM | POA: Diagnosis not present

## 2017-08-07 DIAGNOSIS — I509 Heart failure, unspecified: Secondary | ICD-10-CM | POA: Diagnosis not present

## 2017-08-07 DIAGNOSIS — M6281 Muscle weakness (generalized): Secondary | ICD-10-CM | POA: Diagnosis not present

## 2017-08-07 DIAGNOSIS — M25512 Pain in left shoulder: Secondary | ICD-10-CM | POA: Diagnosis not present

## 2017-08-07 DIAGNOSIS — J45909 Unspecified asthma, uncomplicated: Secondary | ICD-10-CM | POA: Diagnosis not present

## 2017-08-07 DIAGNOSIS — N183 Chronic kidney disease, stage 3 (moderate): Secondary | ICD-10-CM | POA: Diagnosis not present

## 2017-08-07 DIAGNOSIS — M3131 Wegener's granulomatosis with renal involvement: Secondary | ICD-10-CM | POA: Diagnosis not present

## 2017-08-07 DIAGNOSIS — I679 Cerebrovascular disease, unspecified: Secondary | ICD-10-CM | POA: Diagnosis not present

## 2017-08-07 DIAGNOSIS — I471 Supraventricular tachycardia: Secondary | ICD-10-CM | POA: Diagnosis not present

## 2017-08-08 DIAGNOSIS — M6281 Muscle weakness (generalized): Secondary | ICD-10-CM | POA: Diagnosis not present

## 2017-08-08 DIAGNOSIS — M25512 Pain in left shoulder: Secondary | ICD-10-CM | POA: Diagnosis not present

## 2017-08-08 DIAGNOSIS — M25522 Pain in left elbow: Secondary | ICD-10-CM | POA: Diagnosis not present

## 2017-08-09 LAB — BASIC METABOLIC PANEL
BUN: 8 (ref 4–21)
Creatinine: 0.6 (ref 0.5–1.1)
Glucose: 89
Potassium: 3.9 (ref 3.4–5.3)
SODIUM: 135 — AB (ref 137–147)

## 2017-08-10 DIAGNOSIS — N183 Chronic kidney disease, stage 3 (moderate): Secondary | ICD-10-CM | POA: Diagnosis not present

## 2017-08-10 DIAGNOSIS — M6281 Muscle weakness (generalized): Secondary | ICD-10-CM | POA: Diagnosis not present

## 2017-08-10 DIAGNOSIS — I509 Heart failure, unspecified: Secondary | ICD-10-CM | POA: Diagnosis not present

## 2017-08-10 DIAGNOSIS — I679 Cerebrovascular disease, unspecified: Secondary | ICD-10-CM | POA: Diagnosis not present

## 2017-08-10 DIAGNOSIS — I471 Supraventricular tachycardia: Secondary | ICD-10-CM | POA: Diagnosis not present

## 2017-08-10 DIAGNOSIS — J45909 Unspecified asthma, uncomplicated: Secondary | ICD-10-CM | POA: Diagnosis not present

## 2017-08-10 DIAGNOSIS — M3131 Wegener's granulomatosis with renal involvement: Secondary | ICD-10-CM | POA: Diagnosis not present

## 2017-08-10 DIAGNOSIS — M25512 Pain in left shoulder: Secondary | ICD-10-CM | POA: Diagnosis not present

## 2017-08-10 DIAGNOSIS — M25522 Pain in left elbow: Secondary | ICD-10-CM | POA: Diagnosis not present

## 2017-08-14 DIAGNOSIS — M6281 Muscle weakness (generalized): Secondary | ICD-10-CM | POA: Diagnosis not present

## 2017-08-14 DIAGNOSIS — M25522 Pain in left elbow: Secondary | ICD-10-CM | POA: Diagnosis not present

## 2017-08-14 DIAGNOSIS — M25512 Pain in left shoulder: Secondary | ICD-10-CM | POA: Diagnosis not present

## 2017-08-15 DIAGNOSIS — M3131 Wegener's granulomatosis with renal involvement: Secondary | ICD-10-CM | POA: Diagnosis not present

## 2017-08-15 DIAGNOSIS — I679 Cerebrovascular disease, unspecified: Secondary | ICD-10-CM | POA: Diagnosis not present

## 2017-08-15 DIAGNOSIS — M6281 Muscle weakness (generalized): Secondary | ICD-10-CM | POA: Diagnosis not present

## 2017-08-15 DIAGNOSIS — M25522 Pain in left elbow: Secondary | ICD-10-CM | POA: Diagnosis not present

## 2017-08-15 DIAGNOSIS — I509 Heart failure, unspecified: Secondary | ICD-10-CM | POA: Diagnosis not present

## 2017-08-15 DIAGNOSIS — M25512 Pain in left shoulder: Secondary | ICD-10-CM | POA: Diagnosis not present

## 2017-08-15 DIAGNOSIS — I471 Supraventricular tachycardia: Secondary | ICD-10-CM | POA: Diagnosis not present

## 2017-08-15 DIAGNOSIS — J45909 Unspecified asthma, uncomplicated: Secondary | ICD-10-CM | POA: Diagnosis not present

## 2017-08-15 DIAGNOSIS — N183 Chronic kidney disease, stage 3 (moderate): Secondary | ICD-10-CM | POA: Diagnosis not present

## 2017-08-16 ENCOUNTER — Other Ambulatory Visit: Payer: Self-pay | Admitting: *Deleted

## 2017-08-16 DIAGNOSIS — M25512 Pain in left shoulder: Secondary | ICD-10-CM | POA: Diagnosis not present

## 2017-08-16 DIAGNOSIS — M25522 Pain in left elbow: Secondary | ICD-10-CM | POA: Diagnosis not present

## 2017-08-16 DIAGNOSIS — M6281 Muscle weakness (generalized): Secondary | ICD-10-CM | POA: Diagnosis not present

## 2017-08-16 LAB — CBC AND DIFFERENTIAL
HEMATOCRIT: 28 — AB (ref 36–46)
Hemoglobin: 9.9 — AB (ref 12.0–16.0)
PLATELETS: 307 (ref 150–399)
WBC: 6.3

## 2017-08-16 LAB — POCT INR: INR: 1.4 — AB (ref <=1.1)

## 2017-08-16 LAB — PROTIME-INR: PROTIME: 14.9 — AB (ref 10.0–13.8)

## 2017-08-17 DIAGNOSIS — J45909 Unspecified asthma, uncomplicated: Secondary | ICD-10-CM | POA: Diagnosis not present

## 2017-08-17 DIAGNOSIS — N183 Chronic kidney disease, stage 3 (moderate): Secondary | ICD-10-CM | POA: Diagnosis not present

## 2017-08-17 DIAGNOSIS — I679 Cerebrovascular disease, unspecified: Secondary | ICD-10-CM | POA: Diagnosis not present

## 2017-08-17 DIAGNOSIS — I509 Heart failure, unspecified: Secondary | ICD-10-CM | POA: Diagnosis not present

## 2017-08-17 DIAGNOSIS — I471 Supraventricular tachycardia: Secondary | ICD-10-CM | POA: Diagnosis not present

## 2017-08-17 DIAGNOSIS — M3131 Wegener's granulomatosis with renal involvement: Secondary | ICD-10-CM | POA: Diagnosis not present

## 2017-08-20 DIAGNOSIS — M25512 Pain in left shoulder: Secondary | ICD-10-CM | POA: Diagnosis not present

## 2017-08-20 DIAGNOSIS — I509 Heart failure, unspecified: Secondary | ICD-10-CM | POA: Diagnosis not present

## 2017-08-20 DIAGNOSIS — I471 Supraventricular tachycardia: Secondary | ICD-10-CM | POA: Diagnosis not present

## 2017-08-20 DIAGNOSIS — N183 Chronic kidney disease, stage 3 (moderate): Secondary | ICD-10-CM | POA: Diagnosis not present

## 2017-08-20 DIAGNOSIS — J45909 Unspecified asthma, uncomplicated: Secondary | ICD-10-CM | POA: Diagnosis not present

## 2017-08-20 DIAGNOSIS — M6281 Muscle weakness (generalized): Secondary | ICD-10-CM | POA: Diagnosis not present

## 2017-08-20 DIAGNOSIS — M25522 Pain in left elbow: Secondary | ICD-10-CM | POA: Diagnosis not present

## 2017-08-20 DIAGNOSIS — I679 Cerebrovascular disease, unspecified: Secondary | ICD-10-CM | POA: Diagnosis not present

## 2017-08-20 DIAGNOSIS — M3131 Wegener's granulomatosis with renal involvement: Secondary | ICD-10-CM | POA: Diagnosis not present

## 2017-08-21 ENCOUNTER — Encounter: Payer: Self-pay | Admitting: Internal Medicine

## 2017-08-21 NOTE — Telephone Encounter (Signed)
Please Mychart message from patient/family.

## 2017-08-22 ENCOUNTER — Encounter: Payer: Self-pay | Admitting: Internal Medicine

## 2017-08-22 DIAGNOSIS — M25512 Pain in left shoulder: Secondary | ICD-10-CM | POA: Diagnosis not present

## 2017-08-22 DIAGNOSIS — M3131 Wegener's granulomatosis with renal involvement: Secondary | ICD-10-CM | POA: Diagnosis not present

## 2017-08-22 DIAGNOSIS — J45909 Unspecified asthma, uncomplicated: Secondary | ICD-10-CM | POA: Diagnosis not present

## 2017-08-22 DIAGNOSIS — I679 Cerebrovascular disease, unspecified: Secondary | ICD-10-CM | POA: Diagnosis not present

## 2017-08-22 DIAGNOSIS — I509 Heart failure, unspecified: Secondary | ICD-10-CM | POA: Diagnosis not present

## 2017-08-22 DIAGNOSIS — I471 Supraventricular tachycardia: Secondary | ICD-10-CM | POA: Diagnosis not present

## 2017-08-22 DIAGNOSIS — N183 Chronic kidney disease, stage 3 (moderate): Secondary | ICD-10-CM | POA: Diagnosis not present

## 2017-08-22 DIAGNOSIS — M6281 Muscle weakness (generalized): Secondary | ICD-10-CM | POA: Diagnosis not present

## 2017-08-22 DIAGNOSIS — M25522 Pain in left elbow: Secondary | ICD-10-CM | POA: Diagnosis not present

## 2017-08-23 ENCOUNTER — Non-Acute Institutional Stay: Payer: Medicare Other | Admitting: Internal Medicine

## 2017-08-23 ENCOUNTER — Encounter: Payer: Self-pay | Admitting: Internal Medicine

## 2017-08-23 DIAGNOSIS — J9611 Chronic respiratory failure with hypoxia: Secondary | ICD-10-CM

## 2017-08-23 DIAGNOSIS — I679 Cerebrovascular disease, unspecified: Secondary | ICD-10-CM | POA: Diagnosis not present

## 2017-08-23 DIAGNOSIS — G4700 Insomnia, unspecified: Secondary | ICD-10-CM

## 2017-08-23 DIAGNOSIS — M3131 Wegener's granulomatosis with renal involvement: Secondary | ICD-10-CM | POA: Diagnosis not present

## 2017-08-23 DIAGNOSIS — F411 Generalized anxiety disorder: Secondary | ICD-10-CM

## 2017-08-23 DIAGNOSIS — I509 Heart failure, unspecified: Secondary | ICD-10-CM | POA: Diagnosis not present

## 2017-08-23 DIAGNOSIS — J45909 Unspecified asthma, uncomplicated: Secondary | ICD-10-CM | POA: Diagnosis not present

## 2017-08-23 DIAGNOSIS — N183 Chronic kidney disease, stage 3 (moderate): Secondary | ICD-10-CM | POA: Diagnosis not present

## 2017-08-23 DIAGNOSIS — I471 Supraventricular tachycardia: Secondary | ICD-10-CM | POA: Diagnosis not present

## 2017-08-23 NOTE — Progress Notes (Signed)
Location:  [facility] Room Number: [place] [facility] Service:  ALF [phone]) Provider:  Blanchie Serve, MD  Blanchie Serve, MD  [name], MD as PCP - General (Internal Medicine) Unice Bailey, MD as Consulting Physician (Rheumatology) Rozetta Nunnery, MD as Consulting Physician (Otolaryngology) Marcial Pacas, MD as Consulting Physician (Neurology) Michel Bickers, MD as Consulting Physician (Infectious Diseases) Marin Olp Rudell Cobb, MD as Consulting Physician (Oncology) Juanito Doom, MD as Consulting Physician (Pulmonary Disease) Tanda Rockers, MD as Consulting Physician (Pulmonary Disease) Estill Dooms, MD as Consulting Physician (Geriatric Medicine) Coralie Keens, MD as Consulting Physician (General Surgery) Mast, Man X, NP as Nurse Practitioner (Nurse Practitioner)  Extended Emergency Contact Information Primary Emergency Contact: [facility] Address: [place], [place] [address] Johnnette Litter of [place] Phone: [phone] Mobile Phone: [phone] Relation: Daughter Secondary Emergency Contact: Wentzel,[name] Address: [address] [place]          [place], [place] [address] [place] of [place] Phone: [phone] Mobile Phone: [phone] Relation: Son  Code Status:  DNR  Goals of care: Advanced Directive information Advanced Directives 08/23/2017  Does Patient Have a Medical Advance Directive? Yes  Type of Paramedic of [place];Living will  Does patient want to make changes to medical advance directive? No - Patient declined  Copy of [place] in Chart? Yes  Would patient like information on creating a medical advance directive? -  Pre-existing out of facility DNR order (yellow form or pink MOST form) -     Chief Complaint  Patient presents with  . Acute Visit    Family Concerns    HPI:  Pt is a 84 y.o. female seen today  for an acute visit due to family concern. Pt resides in ALF at present and is under hospice services. Patient has made improvement with her activities some. Family have concerns about her taking ativan and want her weaned off it if possible. They are also concerned if oxygen could be changed to as needed if her breathing is fair. They would like a sleeping aid for her and for her to have meals in dining area if possible. Patient has wegner's granulomatosis, iron deficiency and some cognitive impairment. She is a high aspiration risk as well. She is seen in her room today. She appears comfortable, hard of hearing and does not have her hearing aid with her at present. Communication today via whiteboard.   Past Medical History:  Diagnosis Date  . Acute respiratory failure with hypoxia ([place])   . Anemia   . Aspergillosis ([place])   . Bladder incontinence   . Blindness of one eye   . Cancer ([place])    melanoma   ([place])  . Chronic sinusitis   . Clotting disorder ([place])   . Difficult intubation    Difficult airway with intubation for ARF 07/22/14 (due to anterior larynx, also had mucous plug)  . Fatigue 12/09/2015  . Glaucoma   . Hearing loss in left ear    hearing aid both ears  . History of pulmonary embolism 1997  . Interstitial emphysema ([place])   . Left shoulder pain 12/09/2015  . Multiple allergies   . Neuropathy   . OA (osteoarthritis)   . Pneumonia 12/15  . Shortness of breath dyspnea   . Stroke [facility])    ??? blind left eye  . Wegener's granulomatosis ([place])  Past Surgical History:  Procedure Laterality Date  . bronchosocpy     pt. states she has had over 10 in last 20 yrs.  Marland Kitchen CATARACT EXTRACTION    . COCHLEAR IMPLANT Left 10/27/2015   Procedure: LEFT COCHLEAR IMPLANT;  Surgeon: Leta Baptist, MD;  Location: [facility];  Service: ENT;  Laterality: Left;  . INGUINAL HERNIA REPAIR Left 02/17/2014   Procedure: LEFT INGUINAL HERNIA REPAIR WITH MESH;  Surgeon: Harl Bowie, MD;  Location: [facility];  Service: General;  Laterality: Left;  . INSERTION OF MESH N/A 02/17/2014   Procedure: INSERTION OF MESH;  Surgeon: Harl Bowie, MD;  Location: [facility];  Service: General;  Laterality: N/A;  . KNEE ARTHROSCOPY  2006   rt   . MELANOMA EXCISION     left leg  . PUBOVAGINAL SLING    . TEAR DUCT PROBING    . TUBAL LIGATION    . TYMPANOMASTOIDECTOMY Left 04/28/2015  . TYMPANOMASTOIDECTOMY Left 04/28/2015   Procedure: LEFT TYMPANOMASTOIDECTOMY;  Surgeon: Leta Baptist, MD;  Location: [facility];  Service: ENT;  Laterality: Left;  Marland Kitchen VENA CAVA FILTER PLACEMENT  1997   [place] filter    Allergies  Allergen Reactions  . Adhesive [Tape] Rash    Caused a rash on lower extremities     Outpatient Encounter Medications as of 08/23/2017  Medication Sig  . acetaminophen (TYLENOL) 325 MG tablet Take 650 mg by mouth See admin instructions. 650 mg at bedtime every night and may take an additional 650 mg every 12 hours as needed for pain  . diclofenac sodium (VOLTAREN) 1 % GEL Apply 2 g topically 3 (three) times daily. Apply to left shoulder.  . feeding supplement (BOOST HIGH PROTEIN) LIQD Take 1 Container by mouth 2 (two) times daily.  . Fluticasone-Salmeterol (ADVAIR DISKUS) 500-50 MCG/DOSE AEPB INHALE 1 PUFF EVERY 12 HOURS.  . folic acid (FOLVITE) 1 MG tablet Take 1 mg by mouth daily.   Marland Kitchen guaiFENesin (MUCINEX) 600 MG 12 hr tablet Take 600 mg by mouth every 12 (twelve) hours.   Marland Kitchen guaiFENesin-dextromethorphan (ROBITUSSIN DM) 100-10 MG/5ML syrup Take 5 mLs every 4 (four) hours as needed by mouth for cough.  . hydrocortisone cream 1 % Apply 1 application topically 4 (four) times daily as needed for itching.   Marland Kitchen ipratropium (ATROVENT) 0.02 % nebulizer solution Take 2.5 mLs (0.5 mg total) 3 (three) times daily by nebulization.  Marland Kitchen latanoprost (XALATAN) 0.005 % ophthalmic solution Place 1 drop into both eyes at bedtime.   . levalbuterol (XOPENEX) 0.63 MG/3ML nebulizer solution  Take 0.63 mg by nebulization every 6 (six) hours as needed for wheezing or shortness of breath.  Marland Kitchen LORazepam (ATIVAN) 0.5 MG tablet Take 0.25 mg by mouth at bedtime.  . naphazoline-pheniramine (NAPHCON-A) 0.025-0.3 % ophthalmic solution Place 1 drop 3 (three) times daily into the left eye.   . nystatin (NYSTATIN) powder Apply topically 2 (two) times daily.  Marland Kitchen omeprazole (PRILOSEC) 20 MG capsule Take 20 mg by mouth daily.  . OXYGEN Inhale 2 L into the lungs as needed (to maintain 90% o2).  . phenol (SORE THROAT SPRAY) 1.4 % LIQD Use as directed 1 spray in the mouth or throat as needed for throat irritation / pain.   . potassium chloride SA (K-DUR,KLOR-CON) 20 MEQ tablet Take 40 mEq by mouth daily.   Marland Kitchen saccharomyces boulardii (FLORASTOR) 250 MG capsule Take 250 mg by mouth daily.   Marland Kitchen sulfamethoxazole-trimethoprim (BACTRIM DS,SEPTRA DS) 800-160  MG tablet Take 1 tablet by mouth every Monday, Wednesday, and Friday.   . warfarin (COUMADIN) 2 MG tablet Take 2 mg by mouth one time only at 6 PM.  . [DISCONTINUED] antiseptic oral rinse (BIOTENE) LIQD 15 mLs by Mouth Rinse route as needed for dry mouth.  . [DISCONTINUED] Calcium Carbonate-Vitamin D3 (CALCIUM 600-D) 600-400 MG-UNIT TABS Take 1 tablet by mouth daily.  . [DISCONTINUED] Cholecalciferol (VITAMIN D-3) 1000 units CAPS Take 1,000 Units by mouth daily.  . [DISCONTINUED] gabapentin (NEURONTIN) 100 MG capsule Take 100 mg by mouth 2 (two) times daily.   . [DISCONTINUED] LORazepam (ATIVAN) 0.5 MG tablet Take 1 tablet (0.5 mg total) by mouth at bedtime. (Patient taking differently: Take 0.25 mg by mouth at bedtime. )  . [DISCONTINUED] metoprolol tartrate (LOPRESSOR) 12.5 mg TABS tablet Take 12.5 mg by mouth 2 (two) times daily.  . [DISCONTINUED] polyvinyl alcohol (LIQUIFILM TEARS) 1.4 % ophthalmic solution Place 1 drop into both eyes 4 (four) times daily.   No facility-administered encounter medications on file as of 08/23/2017.     Review of Systems    Constitutional: Positive for fatigue. Negative for diaphoresis and fever.  HENT: Positive for hearing loss. Negative for congestion, ear pain, mouth sores and trouble swallowing.        Has hearing aid  Respiratory: Positive for shortness of breath. Negative for choking, chest tightness and wheezing.   Cardiovascular: Negative for chest pain.  Musculoskeletal: Positive for gait problem.  Neurological: Negative for light-headedness and headaches.  Hematological: Bruises/bleeds easily.  Psychiatric/Behavioral: Positive for confusion and sleep disturbance. Negative for agitation, behavioral problems and dysphoric mood. The patient is not nervous/anxious.     Immunization History  Administered Date(s) Administered  . Influenza Split 03/04/2012, 03/11/2014, 04/01/2015  . Influenza,inj,Quad PF,6+ Mos 03/03/2013  . Influenza-Unspecified 04/01/2015, 04/19/2016, 04/23/2017  . PPD Test 05/25/2015  . Pneumococcal Conjugate-13 07/01/2015  . Pneumococcal Polysaccharide-23 09/02/2015  . Zoster 09/30/1994   Pertinent  Health Maintenance Due  Topic Date Due  . INFLUENZA VACCINE  Completed  . DEXA SCAN  Completed  . PNA vac Low Risk Adult  Completed   Fall Risk  03/20/2017 12/09/2015 09/23/2015 09/09/2015 02/03/2015  Falls in the past year? No No Yes Yes Yes  Number falls in past yr: - - 2 or more 2 or more 2 or more  Comment - - - - -  Injury with Fall? - - No No -   Functional Status Survey:    Vitals:   08/23/17 1335  BP: 114/66  Pulse: 66  Resp: 16  Temp: 98.8 F (37.1 C)  TempSrc: Oral  SpO2: 97%  Weight: 103 lb (46.7 kg)  Height: 5\' 1"  (1.549 m)   Body mass index is 19.46 kg/m.   Wt Readings from Last 3 Encounters:  08/23/17 103 lb (46.7 kg)  07/23/17 114 lb (51.7 kg)  07/19/17 114 lb (51.7 kg)    Physical Exam  Constitutional: No distress.  Frail, elderly female  HENT:  Head: Normocephalic and atraumatic.  Mouth/Throat: Oropharynx is clear and moist.  Eyes: Conjunctivae  are normal. Pupils are equal, round, and reactive to light. Right eye exhibits no discharge. Left eye exhibits no discharge.  Neck: Neck supple.  Cardiovascular: Normal rate and regular rhythm.  Pulmonary/Chest: Effort normal. No respiratory distress. She has no wheezes. She has no rales. She exhibits no tenderness.  Poor air movement to lung bases  Abdominal: Soft.  Musculoskeletal:  Trace leg edema and unsteady gait  Neurological:  She is alert.  Oriented to self, place and time  Skin: She is not diaphoretic.  Psychiatric: She has a normal mood and affect.    Labs reviewed: Recent Labs    06/21/17 1105  07/18/17 1448 07/19/17 0959 07/24/17 08/02/17 08/09/17  NA 143   < > 135 136 137 131* 135*  K 4.1   < > 4.6 4.8* 4.5 5.0 3.9  CL 107  --  103 106  --   --   --   CO2 26  --  24 21*  --   --   --   GLUCOSE 146*  --  133* 145*  --   --   --   BUN 6*   < > 10 10 12 11 8   CREATININE 1.1   < > 0.77 0.99 0.7 0.8 0.6  CALCIUM 9.9  --  10.2 10.5*  --   --   --    < > = values in this interval not displayed.   Recent Labs    06/05/17 1850  06/21/17 1105  07/16/17 07/19/17 0959 08/02/17  AST 39   < > 28   < > 19 26 16   ALT 23   < > 21   < > 10 12 11   ALKPHOS 129*   < > 124*   < > 169* 203* 179*  BILITOT 0.5  --  0.50  --   --  0.3  --   PROT 6.0*  --  6.9  --   --  7.1  --   ALBUMIN 3.0*  --  3.0*  --   --  2.6*  --    < > = values in this interval not displayed.   Recent Labs    06/21/17 1105  07/18/17 1448 07/19/17 0959 07/24/17 08/02/17 08/16/17  WBC 4.4   < > 7.9 A Manual Differential was performed and is consistent with the Automated Differential. 9.1 10.2 6.7 6.3  NEUTROABS 3.4  --  6.4 7.8*  --   --   --   HGB 11.2*   < > 12.9  --  10.7* 10.5* 9.9*  HCT 34.1*   < > 39.5 37.7 30* 30* 28*  MCV 111*  --  106.3* 105.6*  --   --   --   PLT 328   < > 421.0* 355 381 342 307   < > = values in this interval not displayed.   Lab Results  Component Value Date   TSH 1.160  06/06/2017   Lab Results  Component Value Date   HGBA1C 5.8 (H) 07/28/2014   No results found for: CHOL, HDL, LDLCALC, LDLDIRECT, TRIG, CHOLHDL  Significant Diagnostic Results in last 30 days:  No results found.  Assessment/Plan  Insomnia Start melatonin 3 mg qhs for now and monitor.  Anxiety Stable, family would like ativan tapered off to help her stay more alert if possible. Currently on ativan 0.25 mg daily at bedtime. Change this to every other day for 2 weeks and stop it. Reassess for anxiety if needed  Chronic respiratory failure With wegner's granulomatosis and asthma. Breathing stable at present. Patient would like to use oxygen only if needed. With goal of care for comfort, change o2 to as needed for o2 sat<90% or for dyspnea. Continue oral hygiene and breathing treatment.    Family/ staff Communication: reviewed care plan with patient and charge nurse. Replied back to email from family in [place].    Labs/tests ordered:  None   Blanchie Serve, MD Internal Medicine [facility] [address] [place], [place] [address] Cell Phone (Monday-Friday 8 am - 5 pm): [phone] On Call: [phone] and follow prompts after 5 pm and on weekends Office Phone: [phone] Office Fax: [phone]

## 2017-08-28 ENCOUNTER — Other Ambulatory Visit: Payer: Self-pay | Admitting: *Deleted

## 2017-08-28 LAB — POCT INR: INR: 1.9 — AB (ref <=1.1)

## 2017-08-28 LAB — PROTIME-INR: Protime: 19.7 — AB (ref 10.0–13.8)

## 2017-08-30 ENCOUNTER — Other Ambulatory Visit: Payer: Self-pay | Admitting: *Deleted

## 2017-08-30 LAB — BASIC METABOLIC PANEL
BUN: 9 (ref 4–21)
Creatinine: 0.7 (ref <=1.1)
GLUCOSE: 85
POTASSIUM: 3.7 (ref 3.4–5.3)
Sodium: 139 (ref 137–147)

## 2017-08-30 LAB — HEPATIC FUNCTION PANEL
ALT: 8 (ref 7–35)
AST: 14 (ref 13–35)
Alkaline Phosphatase: 129 — AB (ref 25–125)
Bilirubin, Total: 0.4

## 2017-08-30 LAB — CBC AND DIFFERENTIAL
HCT: 26 — AB (ref 36–46)
Hemoglobin: 9.3 — AB (ref 12.0–16.0)
Platelets: 317 (ref 150–399)
WBC: 4.8

## 2017-08-31 DIAGNOSIS — R1310 Dysphagia, unspecified: Secondary | ICD-10-CM | POA: Diagnosis not present

## 2017-08-31 DIAGNOSIS — J45909 Unspecified asthma, uncomplicated: Secondary | ICD-10-CM | POA: Diagnosis not present

## 2017-08-31 DIAGNOSIS — Z85820 Personal history of malignant melanoma of skin: Secondary | ICD-10-CM | POA: Diagnosis not present

## 2017-08-31 DIAGNOSIS — N183 Chronic kidney disease, stage 3 (moderate): Secondary | ICD-10-CM | POA: Diagnosis not present

## 2017-08-31 DIAGNOSIS — G9009 Other idiopathic peripheral autonomic neuropathy: Secondary | ICD-10-CM | POA: Diagnosis not present

## 2017-08-31 DIAGNOSIS — H409 Unspecified glaucoma: Secondary | ICD-10-CM | POA: Diagnosis not present

## 2017-08-31 DIAGNOSIS — J309 Allergic rhinitis, unspecified: Secondary | ICD-10-CM | POA: Diagnosis not present

## 2017-08-31 DIAGNOSIS — K219 Gastro-esophageal reflux disease without esophagitis: Secondary | ICD-10-CM | POA: Diagnosis not present

## 2017-08-31 DIAGNOSIS — I471 Supraventricular tachycardia: Secondary | ICD-10-CM | POA: Diagnosis not present

## 2017-08-31 DIAGNOSIS — Z86711 Personal history of pulmonary embolism: Secondary | ICD-10-CM | POA: Diagnosis not present

## 2017-08-31 DIAGNOSIS — I509 Heart failure, unspecified: Secondary | ICD-10-CM | POA: Diagnosis not present

## 2017-08-31 DIAGNOSIS — N3281 Overactive bladder: Secondary | ICD-10-CM | POA: Diagnosis not present

## 2017-08-31 DIAGNOSIS — F411 Generalized anxiety disorder: Secondary | ICD-10-CM | POA: Diagnosis not present

## 2017-08-31 DIAGNOSIS — F339 Major depressive disorder, recurrent, unspecified: Secondary | ICD-10-CM | POA: Diagnosis not present

## 2017-08-31 DIAGNOSIS — M3131 Wegener's granulomatosis with renal involvement: Secondary | ICD-10-CM | POA: Diagnosis not present

## 2017-08-31 DIAGNOSIS — I679 Cerebrovascular disease, unspecified: Secondary | ICD-10-CM | POA: Diagnosis not present

## 2017-08-31 DIAGNOSIS — Z86718 Personal history of other venous thrombosis and embolism: Secondary | ICD-10-CM | POA: Diagnosis not present

## 2017-09-04 DIAGNOSIS — N183 Chronic kidney disease, stage 3 (moderate): Secondary | ICD-10-CM | POA: Diagnosis not present

## 2017-09-04 DIAGNOSIS — J45909 Unspecified asthma, uncomplicated: Secondary | ICD-10-CM | POA: Diagnosis not present

## 2017-09-04 DIAGNOSIS — I471 Supraventricular tachycardia: Secondary | ICD-10-CM | POA: Diagnosis not present

## 2017-09-04 DIAGNOSIS — M3131 Wegener's granulomatosis with renal involvement: Secondary | ICD-10-CM | POA: Diagnosis not present

## 2017-09-04 DIAGNOSIS — I679 Cerebrovascular disease, unspecified: Secondary | ICD-10-CM | POA: Diagnosis not present

## 2017-09-04 DIAGNOSIS — I509 Heart failure, unspecified: Secondary | ICD-10-CM | POA: Diagnosis not present

## 2017-09-05 DIAGNOSIS — I471 Supraventricular tachycardia: Secondary | ICD-10-CM | POA: Diagnosis not present

## 2017-09-05 DIAGNOSIS — M3131 Wegener's granulomatosis with renal involvement: Secondary | ICD-10-CM | POA: Diagnosis not present

## 2017-09-05 DIAGNOSIS — I679 Cerebrovascular disease, unspecified: Secondary | ICD-10-CM | POA: Diagnosis not present

## 2017-09-05 DIAGNOSIS — J45909 Unspecified asthma, uncomplicated: Secondary | ICD-10-CM | POA: Diagnosis not present

## 2017-09-05 DIAGNOSIS — N183 Chronic kidney disease, stage 3 (moderate): Secondary | ICD-10-CM | POA: Diagnosis not present

## 2017-09-05 DIAGNOSIS — I509 Heart failure, unspecified: Secondary | ICD-10-CM | POA: Diagnosis not present

## 2017-09-07 DIAGNOSIS — M3131 Wegener's granulomatosis with renal involvement: Secondary | ICD-10-CM | POA: Diagnosis not present

## 2017-09-07 DIAGNOSIS — I509 Heart failure, unspecified: Secondary | ICD-10-CM | POA: Diagnosis not present

## 2017-09-07 DIAGNOSIS — I679 Cerebrovascular disease, unspecified: Secondary | ICD-10-CM | POA: Diagnosis not present

## 2017-09-07 DIAGNOSIS — I471 Supraventricular tachycardia: Secondary | ICD-10-CM | POA: Diagnosis not present

## 2017-09-07 DIAGNOSIS — N183 Chronic kidney disease, stage 3 (moderate): Secondary | ICD-10-CM | POA: Diagnosis not present

## 2017-09-07 DIAGNOSIS — J45909 Unspecified asthma, uncomplicated: Secondary | ICD-10-CM | POA: Diagnosis not present

## 2017-09-12 DIAGNOSIS — M3131 Wegener's granulomatosis with renal involvement: Secondary | ICD-10-CM | POA: Diagnosis not present

## 2017-09-12 DIAGNOSIS — I679 Cerebrovascular disease, unspecified: Secondary | ICD-10-CM | POA: Diagnosis not present

## 2017-09-12 DIAGNOSIS — N183 Chronic kidney disease, stage 3 (moderate): Secondary | ICD-10-CM | POA: Diagnosis not present

## 2017-09-12 DIAGNOSIS — I509 Heart failure, unspecified: Secondary | ICD-10-CM | POA: Diagnosis not present

## 2017-09-12 DIAGNOSIS — J45909 Unspecified asthma, uncomplicated: Secondary | ICD-10-CM | POA: Diagnosis not present

## 2017-09-12 DIAGNOSIS — I471 Supraventricular tachycardia: Secondary | ICD-10-CM | POA: Diagnosis not present

## 2017-09-13 LAB — CMP 10231
Albumin: 2.6
CHLORIDE: 107
Calcium: 9
Carbon Dioxide, Total: 27
EGFR (Non-African Amer.): 82
Globulin: 1.9
TOTAL PROTEIN: 4.5

## 2017-09-13 LAB — CBC AND DIFFERENTIAL
HCT: 26 — AB (ref 36–46)
HEMOGLOBIN: 9.2 — AB (ref 12.0–16.0)
PLATELETS: 313 (ref 150–399)
WBC: 4.2

## 2017-09-13 LAB — BASIC METABOLIC PANEL
BUN: 8 (ref 4–21)
Creatinine: 0.7 (ref 0.5–1.1)
Glucose: 84
Potassium: 3.7 (ref 3.4–5.3)
Sodium: 138 (ref 137–147)

## 2017-09-13 LAB — HEPATIC FUNCTION PANEL
ALK PHOS: 102 (ref 25–125)
ALT: 7 (ref 7–35)
AST: 15 (ref 13–35)
BILIRUBIN, TOTAL: 0.4

## 2017-09-19 DIAGNOSIS — I471 Supraventricular tachycardia: Secondary | ICD-10-CM | POA: Diagnosis not present

## 2017-09-19 DIAGNOSIS — I509 Heart failure, unspecified: Secondary | ICD-10-CM | POA: Diagnosis not present

## 2017-09-19 DIAGNOSIS — I679 Cerebrovascular disease, unspecified: Secondary | ICD-10-CM | POA: Diagnosis not present

## 2017-09-19 DIAGNOSIS — N183 Chronic kidney disease, stage 3 (moderate): Secondary | ICD-10-CM | POA: Diagnosis not present

## 2017-09-19 DIAGNOSIS — M3131 Wegener's granulomatosis with renal involvement: Secondary | ICD-10-CM | POA: Diagnosis not present

## 2017-09-19 DIAGNOSIS — J45909 Unspecified asthma, uncomplicated: Secondary | ICD-10-CM | POA: Diagnosis not present

## 2017-09-20 DIAGNOSIS — I679 Cerebrovascular disease, unspecified: Secondary | ICD-10-CM | POA: Diagnosis not present

## 2017-09-20 DIAGNOSIS — M3131 Wegener's granulomatosis with renal involvement: Secondary | ICD-10-CM | POA: Diagnosis not present

## 2017-09-20 DIAGNOSIS — I509 Heart failure, unspecified: Secondary | ICD-10-CM | POA: Diagnosis not present

## 2017-09-20 DIAGNOSIS — J45909 Unspecified asthma, uncomplicated: Secondary | ICD-10-CM | POA: Diagnosis not present

## 2017-09-20 DIAGNOSIS — I471 Supraventricular tachycardia: Secondary | ICD-10-CM | POA: Diagnosis not present

## 2017-09-20 DIAGNOSIS — N183 Chronic kidney disease, stage 3 (moderate): Secondary | ICD-10-CM | POA: Diagnosis not present

## 2017-09-24 ENCOUNTER — Encounter: Payer: Self-pay | Admitting: Internal Medicine

## 2017-09-27 ENCOUNTER — Encounter: Payer: Self-pay | Admitting: Internal Medicine

## 2017-09-27 ENCOUNTER — Non-Acute Institutional Stay: Payer: Medicare Other | Admitting: Internal Medicine

## 2017-09-27 DIAGNOSIS — D638 Anemia in other chronic diseases classified elsewhere: Secondary | ICD-10-CM

## 2017-09-27 DIAGNOSIS — G8929 Other chronic pain: Secondary | ICD-10-CM | POA: Diagnosis not present

## 2017-09-27 DIAGNOSIS — I679 Cerebrovascular disease, unspecified: Secondary | ICD-10-CM | POA: Diagnosis not present

## 2017-09-27 DIAGNOSIS — R634 Abnormal weight loss: Secondary | ICD-10-CM

## 2017-09-27 DIAGNOSIS — M25512 Pain in left shoulder: Secondary | ICD-10-CM

## 2017-09-27 DIAGNOSIS — H9190 Unspecified hearing loss, unspecified ear: Secondary | ICD-10-CM

## 2017-09-27 DIAGNOSIS — I509 Heart failure, unspecified: Secondary | ICD-10-CM | POA: Diagnosis not present

## 2017-09-27 DIAGNOSIS — M3131 Wegener's granulomatosis with renal involvement: Secondary | ICD-10-CM | POA: Diagnosis not present

## 2017-09-27 DIAGNOSIS — B351 Tinea unguium: Secondary | ICD-10-CM | POA: Diagnosis not present

## 2017-09-27 DIAGNOSIS — N183 Chronic kidney disease, stage 3 (moderate): Secondary | ICD-10-CM | POA: Diagnosis not present

## 2017-09-27 DIAGNOSIS — J45909 Unspecified asthma, uncomplicated: Secondary | ICD-10-CM | POA: Diagnosis not present

## 2017-09-27 DIAGNOSIS — I471 Supraventricular tachycardia: Secondary | ICD-10-CM | POA: Diagnosis not present

## 2017-09-27 NOTE — Progress Notes (Signed)
Location:  [facility] Room Number: [place] of Service:  ALF [phone]) Provider:  Blanchie Serve, MD  Blanchie Serve, MD  [name], MD as PCP - General (Internal Medicine) Unice Bailey, MD as Consulting Physician (Rheumatology) Rozetta Nunnery, MD as Consulting Physician (Otolaryngology) Marcial Pacas, MD as Consulting Physician (Neurology) Michel Bickers, MD as Consulting Physician (Infectious Diseases) Marin Olp Rudell Cobb, MD as Consulting Physician (Oncology) Juanito Doom, MD as Consulting Physician (Pulmonary Disease) Tanda Rockers, MD as Consulting Physician (Pulmonary Disease) Estill Dooms, MD as Consulting Physician (Geriatric Medicine) Coralie Keens, MD as Consulting Physician (General Surgery) Mast, Man X, NP as Nurse Practitioner (Nurse Practitioner)  Extended Emergency Contact Information Primary Emergency Contact: [facility] Address: [place], [place] [address] [place]te [place]itter of [place] Phone: [phone] Mobile Phone: [phone] Relation: Daughter Secondary Emergency Contact: Calo,[name] Address: [address] [place]          [place], [place] [address] [place] Phone: [phone] Mobile Phone: [phone] Relation: Son  Code Status:  DNR  Goals of care: Advanced Directive information Advanced Directives 09/27/2017  Does Patient Have a Medical Advance Directive? Yes  Type of Paramedic of [place];Living will;Out of facility DNR (pink MOST or yellow form)  Does patient want to make changes to medical advance directive? No - Patient declined  Copy of [place] in Chart? Yes  Would patient like information on creating a medical advance directive? -  Pre-existing out of facility DNR order (yellow form or pink MOST form) Yellow form placed in chart (order not valid for inpatient use);Pink MOST form  placed in chart (order not valid for inpatient use)     Chief Complaint  Patient presents with  . Acute Visit    Multiple family concerns    HPI:  Pt is a 84 y.o. female seen today for an acute visit for family concerns and right hand pain. She is seen in her room today with charge nurse and caregiver present. She complaints of discomfort to left shoulder. Per nursing this is a chronic problem, more in am and improves with tylenol and voltaren gel. Per nursing, patient was complaining of pain and discomfort to right hand and fingers yesterday and voltaren gel was applied and tylenol was given. She denies any pain to the hand this morning. Family would like for her to see cochlear implant specialist due to some parts of the implant missing. They have concern for fungal infection to her nails. Family is concerned about the drop in her hemoglobin and if she should resume her visit with hematology. They would also like to know about her weight.   Past Medical History:  Diagnosis Date  . Acute respiratory failure with hypoxia ([place])   . Anemia   . Aspergillosis ([place])   . Bladder incontinence   . Blindness of one eye   . Cancer ([place])    melanoma   (chemo)  . Chronic sinusitis   . Clotting disorder ([place])   . Difficult intubation    Difficult airway with intubation for ARF 07/22/14 (due to anterior larynx, also had mucous plug)  . Fatigue 12/09/2015  . Glaucoma   . Hearing loss in left ear    hearing aid both ears  . History of pulmonary embolism 1997  . Interstitial emphysema ([place])   . Left shoulder pain  12/09/2015  . Multiple allergies   . Neuropathy   . OA (osteoarthritis)   . Pneumonia 12/15  . Shortness of breath dyspnea   . Stroke [facility])    ??? blind left eye  . Wegener's granulomatosis ([place])    Past Surgical History:  Procedure Laterality Date  . bronchosocpy     pt. states she has had over 10 in last 20 yrs.  Marland Kitchen CATARACT EXTRACTION    . COCHLEAR IMPLANT Left 10/27/2015    Procedure: LEFT COCHLEAR IMPLANT;  Surgeon: Leta Baptist, MD;  Location: [facility];  Service: ENT;  Laterality: Left;  . INGUINAL HERNIA REPAIR Left 02/17/2014   Procedure: LEFT INGUINAL HERNIA REPAIR WITH MESH;  Surgeon: Harl Bowie, MD;  Location: [facility];  Service: General;  Laterality: Left;  . INSERTION OF MESH N/A 02/17/2014   Procedure: INSERTION OF MESH;  Surgeon: Harl Bowie, MD;  Location: [facility];  Service: General;  Laterality: N/A;  . KNEE ARTHROSCOPY  2006   rt   . MELANOMA EXCISION     left leg  . PUBOVAGINAL SLING    . TEAR DUCT PROBING    . TUBAL LIGATION    . TYMPANOMASTOIDECTOMY Left 04/28/2015  . TYMPANOMASTOIDECTOMY Left 04/28/2015   Procedure: LEFT TYMPANOMASTOIDECTOMY;  Surgeon: Leta Baptist, MD;  Location: [facility];  Service: ENT;  Laterality: Left;  Marland Kitchen VENA CAVA FILTER PLACEMENT  1997   [place] filter    Allergies  Allergen Reactions  . Adhesive [Tape] Rash    Caused a rash on lower extremities     Outpatient Encounter Medications as of 09/27/2017  Medication Sig  . acetaminophen (TYLENOL) 325 MG tablet Take 650 mg by mouth See admin instructions. 650 mg at bedtime every night and may take an additional 650 mg every 12 hours as needed for pain  . diclofenac sodium (VOLTAREN) 1 % GEL Apply 2 g topically 3 (three) times daily. Apply to left shoulder.  . feeding supplement (BOOST HIGH PROTEIN) LIQD Take 1 Container by mouth 2 (two) times daily.  . Fluticasone-Salmeterol (ADVAIR DISKUS) 500-50 MCG/DOSE AEPB INHALE 1 PUFF EVERY 12 HOURS.  . folic acid (FOLVITE) 1 MG tablet Take 1 mg by mouth daily.   Marland Kitchen guaiFENesin (MUCINEX) 600 MG 12 hr tablet Take 600 mg by mouth every 12 (twelve) hours.   Marland Kitchen guaiFENesin-dextromethorphan (ROBITUSSIN DM) 100-10 MG/5ML syrup Take 5 mLs every 4 (four) hours as needed by mouth for cough.  . hydrocortisone cream 1 % Apply 1 application topically 4 (four) times daily as needed for itching.   Marland Kitchen  ipratropium (ATROVENT) 0.02 % nebulizer solution Take 2.5 mLs (0.5 mg total) 3 (three) times daily by nebulization.  Marland Kitchen latanoprost (XALATAN) 0.005 % ophthalmic solution Place 1 drop into both eyes at bedtime.   . levalbuterol (XOPENEX) 0.63 MG/3ML nebulizer solution Take 0.63 mg by nebulization every 6 (six) hours as needed for wheezing or shortness of breath.  . Melatonin 3 MG TABS Take 3 mg by mouth at bedtime.  . naphazoline-pheniramine (NAPHCON-A) 0.025-0.3 % ophthalmic solution Place 1 drop 3 (three) times daily into the left eye.   Marland Kitchen omeprazole (PRILOSEC) 20 MG capsule Take 20 mg by mouth daily.  . OXYGEN Inhale 2 L into the lungs as needed (to maintain 90% o2).  . phenol (SORE THROAT SPRAY) 1.4 % LIQD Use as directed 1 spray in the mouth or throat as needed for throat irritation / pain.   . potassium chloride SA (  K-DUR,KLOR-CON) 20 MEQ tablet Take 40 mEq by mouth daily.   . ramelteon (ROZEREM) 8 MG tablet Take 8 mg by mouth at bedtime.  . saccharomyces boulardii (FLORASTOR) 250 MG capsule Take 250 mg by mouth daily.   Marland Kitchen sulfamethoxazole-trimethoprim (BACTRIM DS,SEPTRA DS) 800-160 MG tablet Take 1 tablet by mouth every Monday, Wednesday, and Friday.   . warfarin (COUMADIN) 3 MG tablet Take 3 mg by mouth daily.  . [DISCONTINUED] LORazepam (ATIVAN) 0.5 MG tablet Take 0.25 mg by mouth at bedtime.  . [DISCONTINUED] nystatin (NYSTATIN) powder Apply topically 2 (two) times daily.  . [DISCONTINUED] warfarin (COUMADIN) 2 MG tablet Take 2 mg by mouth one time only at 6 PM.   No facility-administered encounter medications on file as of 09/27/2017.     Review of Systems  Constitutional: Positive for fatigue. Negative for appetite change, chills and fever.       Per caregiver, she is eating about 70% of her meals, feeds herself  HENT: Positive for hearing loss. Negative for congestion, ear pain, mouth sores, sore throat and trouble swallowing.   Respiratory: Positive for cough. Negative for  shortness of breath.   Cardiovascular: Negative for chest pain and palpitations.  Gastrointestinal: Negative for abdominal pain, diarrhea, nausea and vomiting.  Genitourinary: Positive for urgency. Negative for dysuria, flank pain and hematuria.  Musculoskeletal: Positive for gait problem.       Uses walker for ambulation, has pain to left shoulder, denies pain to right wrist or fingers this visit  Skin: Negative for rash.  Neurological: Negative for dizziness and headaches.  Psychiatric/Behavioral: Negative for behavioral problems.    Immunization History  Administered Date(s) Administered  . Influenza Split 03/04/2012, 03/11/2014, 04/01/2015  . Influenza,inj,Quad PF,6+ Mos 03/03/2013  . Influenza-Unspecified 04/01/2015, 04/19/2016, 04/23/2017  . PPD Test 05/25/2015  . Pneumococcal Conjugate-13 07/01/2015  . Pneumococcal Polysaccharide-23 09/02/2015  . Zoster 09/30/1994   Pertinent  Health Maintenance Due  Topic Date Due  . INFLUENZA VACCINE  Completed  . DEXA SCAN  Completed  . PNA vac Low Risk Adult  Completed   Fall Risk  03/20/2017 12/09/2015 09/23/2015 09/09/2015 02/03/2015  Falls in the past year? No No Yes Yes Yes  Number falls in past yr: - - 2 or more 2 or more 2 or more  Comment - - - - -  Injury with Fall? - - No No -   Functional Status Survey:    Vitals:   09/27/17 0951  BP: 118/66  Pulse: 66  Resp: 16  Temp: 98.3 F (36.8 C)  TempSrc: Oral  SpO2: 97%  Weight: 102 lb (46.3 kg)  Height: 5\' 1"  (1.549 m)   Body mass index is 19.27 kg/m.   Wt Readings from Last 3 Encounters:  09/27/17 102 lb (46.3 kg)  08/23/17 103 lb (46.7 kg)  07/23/17 114 lb (51.7 kg)   Physical Exam  Constitutional: No distress.  Frail, chronically ill appearing female   HENT:  Head: Normocephalic and atraumatic.  Right Ear: External ear normal.  Left Ear: External ear normal.  Mouth/Throat: Oropharynx is clear and moist.  Eyes: Pupils are equal, round, and reactive to light.  Conjunctivae are normal.  Neck: Neck supple.  Cardiovascular: Normal rate and regular rhythm.  Pulmonary/Chest: Effort normal. No respiratory distress.  Poor air movement  Abdominal: Soft. Bowel sounds are normal. There is no tenderness. There is no guarding.  Musculoskeletal:  Limited ROM with left shoulder, normal temperature ad no redness to left shoulder, good ROM with  right shoulder, trace leg edema, unsteady gait, uses walker to ambulate  Lymphadenopathy:    She has no cervical adenopathy.  Neurological: She is alert.  Oriented to self and place only  Skin: Skin is warm and dry. No rash noted. She is not diaphoretic.  Psychiatric: She has a normal mood and affect.    Labs reviewed: Recent Labs    06/21/17 1105  07/18/17 1448 07/19/17 0959  08/09/17 08/30/17 09/13/17  NA 143   < > 135 136   < > 135* 139 138  K 4.1   < > 4.6 4.8*   < > 3.9 3.7 3.7  CL 107  --  103 106  --   --   --  107  CO2 26  --  24 21*  --   --   --  27  GLUCOSE 146*  --  133* 145*  --   --   --   --   BUN 6*   < > 10 10   < > 8 9 8   CREATININE 1.1   < > 0.77 0.99   < > 0.6 0.7 0.7  CALCIUM 9.9  --  10.2 10.5*  --   --   --  9.0   < > = values in this interval not displayed.   Recent Labs    06/05/17 1850  06/21/17 1105  07/19/17 0959 08/02/17 08/30/17 09/13/17  AST 39   < > 28   < > 26 16 14 15   ALT 23   < > 21   < > 12 11 8 7   ALKPHOS 129*   < > 124*   < > 203* 179* 129* 102  BILITOT 0.5  --  0.50  --  0.3  --   --   --   PROT 6.0*  --  6.9  --  7.1  --   --  4.5  ALBUMIN 3.0*  --  3.0*  --  2.6*  --   --  2.6   < > = values in this interval not displayed.   Recent Labs    06/21/17 1105  07/18/17 1448 07/19/17 0959  08/16/17 08/30/17 09/13/17  WBC 4.4   < > 7.9 A Manual Differential was performed and is consistent with the Automated Differential. 9.1   < > 6.3 4.8 4.2  NEUTROABS 3.4  --  6.4 7.8*  --   --   --   --   HGB 11.2*   < > 12.9  --    < > 9.9* 9.3* 9.2*  HCT 34.1*   < > 39.5  37.7   < > 28* 26* 26*  MCV 111*  --  106.3* 105.6*  --   --   --   --   PLT 328   < > 421.0* 355   < > 307 317 313   < > = values in this interval not displayed.   Lab Results  Component Value Date   TSH 1.160 06/06/2017   Lab Results  Component Value Date   HGBA1C 5.8 (H) 07/28/2014   No results found for: CHOL, HDL, LDLCALC, LDLDIRECT, TRIG, CHOLHDL  Significant Diagnostic Results in last 30 days:  No results found.  Assessment/Plan  1. Weight loss Has lost 12 lbs over 2 month period. Patient has wegner's granulomatosis, cognitive impairment and other chronic medical problems and is currently under hospice service. Decline anticipated with her failure to thrive with her progressive wagner's.  Nutrition consult to evaluate for nutritional supplement  2. onychomycosis Has pitting deformity and few broken nails to fingers of her right hand. Denies pain, no signs of infection. Has history of wegner's granulomatosis. I dont want to try terbinafine given its side effect profile and patient having intrinsic asthma and chronic sinusitis. Will try efinaconazole 10% topical solution daily for now and monitor.   3. Hearing loss, unspecified hearing loss type, unspecified laterality Referral to cochlear implant specialist Dr Kasandra Knudsen Jonathon Bellows Teoh  4. Anemia of chronic disease Reviewed h&h, Hb 9.2. Patient has complaints of chronic fatigue. Family would like her to be seen by hematology service, order will will provided to make follow up appointment  5. Chronic left shoulder pain Continue current pain regimen    Family/ staff Communication: reviewed care plan with patient and charge nurse. Emailed patient's family of current plan   Labs/tests ordered:  None   Blanchie Serve, MD Internal Medicine [place], [place] [address] Cell Phone (Monday-Friday 8 am - 5 pm): [phone] On Call: [phone] and follow prompts after 5 pm and  on weekends Office Phone: [phone] Office Fax: [phone]

## 2017-10-01 DIAGNOSIS — H409 Unspecified glaucoma: Secondary | ICD-10-CM | POA: Diagnosis not present

## 2017-10-01 DIAGNOSIS — K219 Gastro-esophageal reflux disease without esophagitis: Secondary | ICD-10-CM | POA: Diagnosis not present

## 2017-10-01 DIAGNOSIS — M3131 Wegener's granulomatosis with renal involvement: Secondary | ICD-10-CM | POA: Diagnosis not present

## 2017-10-01 DIAGNOSIS — I679 Cerebrovascular disease, unspecified: Secondary | ICD-10-CM | POA: Diagnosis not present

## 2017-10-01 DIAGNOSIS — R1310 Dysphagia, unspecified: Secondary | ICD-10-CM | POA: Diagnosis not present

## 2017-10-01 DIAGNOSIS — I509 Heart failure, unspecified: Secondary | ICD-10-CM | POA: Diagnosis not present

## 2017-10-01 DIAGNOSIS — Z85820 Personal history of malignant melanoma of skin: Secondary | ICD-10-CM | POA: Diagnosis not present

## 2017-10-01 DIAGNOSIS — F339 Major depressive disorder, recurrent, unspecified: Secondary | ICD-10-CM | POA: Diagnosis not present

## 2017-10-01 DIAGNOSIS — I471 Supraventricular tachycardia: Secondary | ICD-10-CM | POA: Diagnosis not present

## 2017-10-01 DIAGNOSIS — F411 Generalized anxiety disorder: Secondary | ICD-10-CM | POA: Diagnosis not present

## 2017-10-01 DIAGNOSIS — G9009 Other idiopathic peripheral autonomic neuropathy: Secondary | ICD-10-CM | POA: Diagnosis not present

## 2017-10-01 DIAGNOSIS — J309 Allergic rhinitis, unspecified: Secondary | ICD-10-CM | POA: Diagnosis not present

## 2017-10-01 DIAGNOSIS — N183 Chronic kidney disease, stage 3 (moderate): Secondary | ICD-10-CM | POA: Diagnosis not present

## 2017-10-01 DIAGNOSIS — J45909 Unspecified asthma, uncomplicated: Secondary | ICD-10-CM | POA: Diagnosis not present

## 2017-10-01 DIAGNOSIS — Z86711 Personal history of pulmonary embolism: Secondary | ICD-10-CM | POA: Diagnosis not present

## 2017-10-01 DIAGNOSIS — N3281 Overactive bladder: Secondary | ICD-10-CM | POA: Diagnosis not present

## 2017-10-01 DIAGNOSIS — Z86718 Personal history of other venous thrombosis and embolism: Secondary | ICD-10-CM | POA: Diagnosis not present

## 2017-10-02 LAB — PROTIME-INR: Protime: 16.8 — AB (ref 10.0–13.8)

## 2017-10-02 LAB — POCT INR: INR: 1.6 — AB (ref <=1.1)

## 2017-10-03 ENCOUNTER — Non-Acute Institutional Stay: Payer: Medicare Other | Admitting: Nurse Practitioner

## 2017-10-03 ENCOUNTER — Other Ambulatory Visit: Payer: Self-pay | Admitting: *Deleted

## 2017-10-03 ENCOUNTER — Encounter: Payer: Self-pay | Admitting: Nurse Practitioner

## 2017-10-03 DIAGNOSIS — R3915 Urgency of urination: Secondary | ICD-10-CM | POA: Insufficient documentation

## 2017-10-03 DIAGNOSIS — T45515D Adverse effect of anticoagulants, subsequent encounter: Secondary | ICD-10-CM | POA: Diagnosis not present

## 2017-10-03 DIAGNOSIS — I509 Heart failure, unspecified: Secondary | ICD-10-CM | POA: Diagnosis not present

## 2017-10-03 DIAGNOSIS — J45909 Unspecified asthma, uncomplicated: Secondary | ICD-10-CM | POA: Diagnosis not present

## 2017-10-03 DIAGNOSIS — I471 Supraventricular tachycardia: Secondary | ICD-10-CM | POA: Diagnosis not present

## 2017-10-03 DIAGNOSIS — M3131 Wegener's granulomatosis with renal involvement: Secondary | ICD-10-CM | POA: Diagnosis not present

## 2017-10-03 DIAGNOSIS — I82501 Chronic embolism and thrombosis of unspecified deep veins of right lower extremity: Secondary | ICD-10-CM

## 2017-10-03 DIAGNOSIS — N3281 Overactive bladder: Secondary | ICD-10-CM

## 2017-10-03 DIAGNOSIS — N183 Chronic kidney disease, stage 3 (moderate): Secondary | ICD-10-CM | POA: Diagnosis not present

## 2017-10-03 DIAGNOSIS — I679 Cerebrovascular disease, unspecified: Secondary | ICD-10-CM | POA: Diagnosis not present

## 2017-10-03 NOTE — Assessment & Plan Note (Signed)
Sub therapeutic INR 1.6 10/02/17, goal of INR 2-3 for chronic DVT right leg. Increase Coumadin 6mg  po 10/03/17, then 3mg  M, T, W, Thr, F, 4mg  Sat, Sun, repeat PT/INR in one week.

## 2017-10-03 NOTE — Assessment & Plan Note (Signed)
increased urinary frequency, urgency, the patient cannot remember the duration or onset, she admitted some kind of discomfort in lower abd, but denied dysuria, lower back pain. She is afebrile. Update CBC/diff, UA C/S

## 2017-10-03 NOTE — Assessment & Plan Note (Signed)
increased urinary frequency, urgency in setting of OAB,  the patient cannot remember the duration or onset, she admitted some kind of discomfort in lower abd, but denied dysuria, lower back pain. She is afebrile. Update CBC/diff, UA C/S

## 2017-10-03 NOTE — Progress Notes (Signed)
Location:  [facility] Room Number: 400 Place of Service:  ALF [phone]) Provider:  Suriyah Vergara, Manxie  NP  Blanchie Serve, MD  [name], MD as PCP - General (Internal Medicine) Unice Bailey, MD as Consulting Physician (Rheumatology) Rozetta Nunnery, MD as Consulting Physician (Otolaryngology) Marcial Pacas, MD as Consulting Physician (Neurology) Michel Bickers, MD as Consulting Physician (Infectious Diseases) Marin Olp Rudell Cobb, MD as Consulting Physician (Oncology) Juanito Doom, MD as Consulting Physician (Pulmonary Disease) Tanda Rockers, MD as Consulting Physician (Pulmonary Disease) Estill Dooms, MD as Consulting Physician (Geriatric Medicine) Coralie Keens, MD as Consulting Physician (General Surgery) Zarah Carbon X, NP as Nurse Practitioner (Nurse Practitioner)  Extended Emergency Contact Information Primary Emergency Contact: [facility] Address: [place], [place] [address] [place] Phone: [phone] Mobile Phone: [phone] Relation: Daughter Secondary Emergency Contact: Cullars,[name] Address: [address] [place]          [place], [place] [address] [place] Phone: [phone] Mobile Phone: [phone] Relation: Son  Code Status:  DNR Goals of care: Advanced Directive information Advanced Directives 10/03/2017  Does Patient Have a Medical Advance Directive? Yes  Type of Paramedic of [place];Living will;Out of facility DNR (pink MOST or yellow form)  Does patient want to make changes to medical advance directive? No - Patient declined  Copy of [place] in Chart? Yes  Would patient like information on creating a medical advance directive? -  Pre-existing out of facility DNR order (yellow form or pink MOST form) Yellow form placed in chart (order not valid for inpatient use)     Chief Complaint    Patient presents with  . Acute Visit    ? UTI, subtherapeutic INR    HPI:  Pt is a 84 y.o. female seen today for an acute visit for increased urinary frequency, urgency, the patient cannot remember the duration or onset, she admitted some kind of discomfort in lower abd, but denied dysuria, lower back pain. She is afebrile. HPI was provided with assistance of staff related to her memory lapses. Dictation #1 [id number]  [id number] Hx of OAB, not on medication. Sub therapeutic INR 1.6 10/02/17, goal of INR 2-3 for chronic DVT right leg.    Past Medical History:  Diagnosis Date  . Acute respiratory failure with hypoxia ([place])   . Anemia   . Aspergillosis ([place])   . Bladder incontinence   . Blindness of one eye   . Cancer ([place])    melanoma   (chemo)  . Chronic sinusitis   . Clotting disorder ([place])   . Difficult intubation    Difficult airway with intubation for ARF 07/22/14 (due to anterior larynx, also had mucous plug)  . Fatigue 12/09/2015  . Glaucoma   . Hearing loss in left ear    hearing aid both ears  . History of pulmonary embolism 1997  . Interstitial emphysema ([place])   . Left shoulder pain 12/09/2015  . Multiple allergies   . Neuropathy   . OA (osteoarthritis)   . Pneumonia 12/15  . Shortness of breath dyspnea   . Stroke [facility])    ??? blind left eye  . Wegener's granulomatosis ([place])    Past Surgical History:  Procedure Laterality Date  . bronchosocpy     pt. states she has had over 10 in last 20 yrs.  Marland Kitchen  CATARACT EXTRACTION    . COCHLEAR IMPLANT Left 10/27/2015   Procedure: LEFT COCHLEAR IMPLANT;  Surgeon: Leta Baptist, MD;  Location: [facility];  Service: ENT;  Laterality: Left;  . INGUINAL HERNIA REPAIR Left 02/17/2014   Procedure: LEFT INGUINAL HERNIA REPAIR WITH MESH;  Surgeon: Harl Bowie, MD;  Location: [facility];  Service: General;  Laterality: Left;  . INSERTION OF MESH N/A 02/17/2014   Procedure: INSERTION OF MESH;  Surgeon: Harl Bowie,  MD;  Location: [facility];  Service: General;  Laterality: N/A;  . KNEE ARTHROSCOPY  2006   rt   . MELANOMA EXCISION     left leg  . PUBOVAGINAL SLING    . TEAR DUCT PROBING    . TUBAL LIGATION    . TYMPANOMASTOIDECTOMY Left 04/28/2015  . TYMPANOMASTOIDECTOMY Left 04/28/2015   Procedure: LEFT TYMPANOMASTOIDECTOMY;  Surgeon: Leta Baptist, MD;  Location: [facility];  Service: ENT;  Laterality: Left;  Marland Kitchen VENA CAVA FILTER PLACEMENT  1997   [place] filter    Allergies  Allergen Reactions  . Adhesive [Tape] Rash    Caused a rash on lower extremities     Outpatient Encounter Medications as of 10/03/2017  Medication Sig  . acetaminophen (TYLENOL) 325 MG tablet Take 650 mg by mouth See admin instructions. 650 mg at bedtime every night and may take an additional 650 mg every 12 hours as needed for pain  . diclofenac sodium (VOLTAREN) 1 % GEL Apply 2 g topically 3 (three) times daily. Apply to left shoulder.  . feeding supplement (BOOST HIGH PROTEIN) LIQD Take 1 Container by mouth 2 (two) times daily.  . Fluticasone-Salmeterol (ADVAIR DISKUS) 500-50 MCG/DOSE AEPB INHALE 1 PUFF EVERY 12 HOURS.  . folic acid (FOLVITE) 1 MG tablet Take 1 mg by mouth daily.   Marland Kitchen guaiFENesin (MUCINEX) 600 MG 12 hr tablet Take 600 mg by mouth every 12 (twelve) hours.   Marland Kitchen guaiFENesin-dextromethorphan (ROBITUSSIN DM) 100-10 MG/5ML syrup Take 5 mLs every 4 (four) hours as needed by mouth for cough.  . hydrocortisone cream 1 % Apply 1 application topically 4 (four) times daily as needed for itching.   Marland Kitchen ipratropium (ATROVENT) 0.02 % nebulizer solution Take 2.5 mLs (0.5 mg total) 3 (three) times daily by nebulization.  Marland Kitchen latanoprost (XALATAN) 0.005 % ophthalmic solution Place 1 drop into both eyes at bedtime.   . levalbuterol (XOPENEX) 0.63 MG/3ML nebulizer solution Take 0.63 mg by nebulization every 6 (six) hours as needed for wheezing or shortness of breath.  . Melatonin 3 MG TABS Take 3 mg by mouth at bedtime.  .  naphazoline-pheniramine (NAPHCON-A) 0.025-0.3 % ophthalmic solution Place 1 drop 3 (three) times daily into the left eye.   Marland Kitchen omeprazole (PRILOSEC) 20 MG capsule Take 20 mg by mouth daily.  . OXYGEN Inhale 2 L into the lungs as needed (to maintain 90% o2).  . phenol (SORE THROAT SPRAY) 1.4 % LIQD Use as directed 1 spray in the mouth or throat as needed for throat irritation / pain.   . potassium chloride SA (K-DUR,KLOR-CON) 20 MEQ tablet Take 40 mEq by mouth daily.   Marland Kitchen saccharomyces boulardii (FLORASTOR) 250 MG capsule Take 250 mg by mouth daily.   Marland Kitchen sulfamethoxazole-trimethoprim (BACTRIM DS,SEPTRA DS) 800-160 MG tablet Take 1 tablet by mouth every Monday, Wednesday, and Friday.   . traZODone (DESYREL) 150 MG tablet Take 75 mg by mouth at bedtime. Take a 1/2 tablet to equal 75 mg.  . warfarin (COUMADIN)  3 MG tablet Take 3 mg by mouth daily.  . [DISCONTINUED] ramelteon (ROZEREM) 8 MG tablet Take 8 mg by mouth at bedtime.   No facility-administered encounter medications on file as of 10/03/2017.    ROS was provided with assistance of staff.  Review of Systems  Constitutional: Negative for activity change, appetite change, chills, diaphoresis, fatigue and fever.  HENT: Positive for hearing loss. Negative for congestion and voice change.   Respiratory: Positive for cough and shortness of breath. Negative for choking and wheezing.   Cardiovascular: Positive for leg swelling. Negative for chest pain and palpitations.  Gastrointestinal: Negative for abdominal distention, abdominal pain, constipation, diarrhea and vomiting.  Genitourinary: Positive for frequency and urgency. Negative for difficulty urinating, dysuria and hematuria.  Musculoskeletal: Positive for arthralgias and gait problem.  Skin: Negative for color change and pallor.  Neurological: Negative for dizziness, speech difficulty, weakness and headaches.       Memory lapses.   Psychiatric/Behavioral: Negative for agitation, behavioral  problems, confusion, hallucinations and sleep disturbance. The patient is not nervous/anxious.     Immunization History  Administered Date(s) Administered  . Influenza Split 03/04/2012, 03/11/2014, 04/01/2015  . Influenza,inj,Quad PF,6+ Mos 03/03/2013  . Influenza-Unspecified 04/01/2015, 04/19/2016, 04/23/2017  . PPD Test 05/25/2015  . Pneumococcal Conjugate-13 07/01/2015  . Pneumococcal Polysaccharide-23 09/02/2015  . Zoster 09/30/1994   Pertinent  Health Maintenance Due  Topic Date Due  . INFLUENZA VACCINE  01/31/2018  . DEXA SCAN  Completed  . PNA vac Low Risk Adult  Completed   Fall Risk  03/20/2017 12/09/2015 09/23/2015 09/09/2015 02/03/2015  Falls in the past year? No No Yes Yes Yes  Number falls in past yr: - - 2 or more 2 or more 2 or more  Comment - - - - -  Injury with Fall? - - No No -   Functional Status Survey:    Vitals:   10/03/17 1307  BP: (!) 108/58  Pulse: 87  Resp: 20  Temp: 98.9 F (37.2 C)  SpO2: 98%  Weight: 102 lb (46.3 kg)  Height: 5\' 1"  (1.549 m)   Body mass index is 19.27 kg/m. Physical Exam  Constitutional: She appears well-developed and well-nourished. No distress.  HENT:  Head: Normocephalic and atraumatic.  Eyes: Pupils are equal, round, and reactive to light. Conjunctivae are normal.  Left eye blind, L cochlear implant.   Neck: Normal range of motion. Neck supple. No JVD present. No thyromegaly present.  Cardiovascular: Normal rate and regular rhythm.  No murmur heard. Pulmonary/Chest: Effort normal. She has rales.  Abdominal: Soft. Bowel sounds are normal. She exhibits no distension. There is no tenderness. There is no rebound and no guarding.  Suprapubic area discomfort upon palpation,   Musculoskeletal: Normal range of motion. She exhibits edema.  Trace edema BLE. Reduced left shoulder ROM limited her over head movement.   Neurological: She is alert. She exhibits normal muscle tone. Coordination normal.  Oriented to person and place.     Skin: Skin is warm and dry. She is not diaphoretic.  Psychiatric: She has a normal mood and affect. Her behavior is normal.    Labs reviewed: Recent Labs    06/21/17 1105  07/18/17 1448 07/19/17 0959  08/09/17 08/30/17 09/13/17  NA 143   < > 135 136   < > 135* 139 138  K 4.1   < > 4.6 4.8*   < > 3.9 3.7 3.7  CL 107  --  103 106  --   --   --  107  CO2 26  --  24 21*  --   --   --  27  GLUCOSE 146*  --  133* 145*  --   --   --   --   BUN 6*   < > 10 10   < > 8 9 8   CREATININE 1.1   < > 0.77 0.99   < > 0.6 0.7 0.7  CALCIUM 9.9  --  10.2 10.5*  --   --   --  9.0   < > = values in this interval not displayed.   Recent Labs    06/05/17 1850  06/21/17 1105  07/19/17 0959 08/02/17 08/30/17 09/13/17  AST 39   < > 28   < > 26 16 14 15   ALT 23   < > 21   < > 12 11 8 7   ALKPHOS 129*   < > 124*   < > 203* 179* 129* 102  BILITOT 0.5  --  0.50  --  0.3  --   --   --   PROT 6.0*  --  6.9  --  7.1  --   --  4.5  ALBUMIN 3.0*  --  3.0*  --  2.6*  --   --  2.6   < > = values in this interval not displayed.   Recent Labs    06/21/17 1105  07/18/17 1448 07/19/17 0959  08/16/17 08/30/17 09/13/17  WBC 4.4   < > 7.9 A Manual Differential was performed and is consistent with the Automated Differential. 9.1   < > 6.3 4.8 4.2  NEUTROABS 3.4  --  6.4 7.8*  --   --   --   --   HGB 11.2*   < > 12.9  --    < > 9.9* 9.3* 9.2*  HCT 34.1*   < > 39.5 37.7   < > 28* 26* 26*  MCV 111*  --  106.3* 105.6*  --   --   --   --   PLT 328   < > 421.0* 355   < > 307 317 313   < > = values in this interval not displayed.   Lab Results  Component Value Date   TSH 1.160 06/06/2017   Lab Results  Component Value Date   HGBA1C 5.8 (H) 07/28/2014   No results found for: CHOL, HDL, LDLCALC, LDLDIRECT, TRIG, CHOLHDL  Significant Diagnostic Results in last 30 days:  No results found.  Assessment/Plan Urinary urgency increased urinary frequency, urgency, the patient cannot remember the duration or onset,  she admitted some kind of discomfort in lower abd, but denied dysuria, lower back pain. She is afebrile. Update CBC/diff, UA C/S  OAB (overactive bladder) increased urinary frequency, urgency in setting of OAB,  the patient cannot remember the duration or onset, she admitted some kind of discomfort in lower abd, but denied dysuria, lower back pain. She is afebrile. Update CBC/diff, UA C/S   Leg DVT (deep venous thromboembolism), chronic, right (HCC) Sub therapeutic INR 1.6 10/02/17, goal of INR 2-3 for chronic DVT right leg. Increase Coumadin 6mg  po 10/03/17, then 3mg  M, T, W, Thr, F, 4mg  Sat, Sun, repeat PT/INR in one week.     Anticoagulants causing adverse effect in therapeutic use, subsequent encounter Sub therapeutic INR 1.6 10/02/17, goal of INR 2-3 for chronic DVT right leg. Increase Coumadin 6mg  po 10/03/17, then 3mg  M, T, W, Thr, F, 4mg  Sat, Sun, repeat  PT/INR in one week.        Family/ staff Communication: plan of care reviewed with the patient and charge nurse   Labs/tests ordered:  CBC/diff, UA C/S in am, PT/INR one week.   Time spend 25 minutes.

## 2017-10-04 ENCOUNTER — Other Ambulatory Visit: Payer: Self-pay | Admitting: *Deleted

## 2017-10-04 LAB — CBC AND DIFFERENTIAL
HCT: 26 — AB (ref 36–46)
HEMOGLOBIN: 9.3 — AB (ref 12.0–16.0)
Platelets: 274 (ref 150–399)
WBC: 4.2

## 2017-10-09 DIAGNOSIS — I509 Heart failure, unspecified: Secondary | ICD-10-CM | POA: Diagnosis not present

## 2017-10-09 DIAGNOSIS — N183 Chronic kidney disease, stage 3 (moderate): Secondary | ICD-10-CM | POA: Diagnosis not present

## 2017-10-09 DIAGNOSIS — I679 Cerebrovascular disease, unspecified: Secondary | ICD-10-CM | POA: Diagnosis not present

## 2017-10-09 DIAGNOSIS — J45909 Unspecified asthma, uncomplicated: Secondary | ICD-10-CM | POA: Diagnosis not present

## 2017-10-09 DIAGNOSIS — I471 Supraventricular tachycardia: Secondary | ICD-10-CM | POA: Diagnosis not present

## 2017-10-09 DIAGNOSIS — M3131 Wegener's granulomatosis with renal involvement: Secondary | ICD-10-CM | POA: Diagnosis not present

## 2017-10-12 ENCOUNTER — Non-Acute Institutional Stay: Payer: Medicare Other | Admitting: Internal Medicine

## 2017-10-12 ENCOUNTER — Encounter: Payer: Self-pay | Admitting: Internal Medicine

## 2017-10-12 DIAGNOSIS — J9611 Chronic respiratory failure with hypoxia: Secondary | ICD-10-CM

## 2017-10-12 DIAGNOSIS — M3131 Wegener's granulomatosis with renal involvement: Secondary | ICD-10-CM | POA: Diagnosis not present

## 2017-10-12 DIAGNOSIS — I679 Cerebrovascular disease, unspecified: Secondary | ICD-10-CM | POA: Diagnosis not present

## 2017-10-12 DIAGNOSIS — G4700 Insomnia, unspecified: Secondary | ICD-10-CM | POA: Diagnosis not present

## 2017-10-12 DIAGNOSIS — I509 Heart failure, unspecified: Secondary | ICD-10-CM | POA: Diagnosis not present

## 2017-10-12 DIAGNOSIS — I2782 Chronic pulmonary embolism: Secondary | ICD-10-CM | POA: Diagnosis not present

## 2017-10-12 DIAGNOSIS — R053 Chronic cough: Secondary | ICD-10-CM

## 2017-10-12 DIAGNOSIS — R05 Cough: Secondary | ICD-10-CM | POA: Diagnosis not present

## 2017-10-12 DIAGNOSIS — K219 Gastro-esophageal reflux disease without esophagitis: Secondary | ICD-10-CM | POA: Diagnosis not present

## 2017-10-12 DIAGNOSIS — I471 Supraventricular tachycardia: Secondary | ICD-10-CM | POA: Diagnosis not present

## 2017-10-12 DIAGNOSIS — J45909 Unspecified asthma, uncomplicated: Secondary | ICD-10-CM | POA: Diagnosis not present

## 2017-10-12 DIAGNOSIS — N183 Chronic kidney disease, stage 3 (moderate): Secondary | ICD-10-CM | POA: Diagnosis not present

## 2017-10-12 DIAGNOSIS — E876 Hypokalemia: Secondary | ICD-10-CM

## 2017-10-12 DIAGNOSIS — E440 Moderate protein-calorie malnutrition: Secondary | ICD-10-CM

## 2017-10-12 NOTE — Progress Notes (Signed)
Location:  [place] Room Number: [place] of Service:  ALF [phone]) Provider:  Blanchie Serve MD  Blanchie Serve, MD  [name], MD as PCP - General (Internal Medicine) Unice Bailey, MD as Consulting Physician (Rheumatology) Rozetta Nunnery, MD as Consulting Physician (Otolaryngology) Marcial Pacas, MD as Consulting Physician (Neurology) Michel Bickers, MD as Consulting Physician (Infectious Diseases) Marin Olp Rudell Cobb, MD as Consulting Physician (Oncology) Juanito Doom, MD as Consulting Physician (Pulmonary Disease) Tanda Rockers, MD as Consulting Physician (Pulmonary Disease) Estill Dooms, MD as Consulting Physician (Geriatric Medicine) Coralie Keens, MD as Consulting Physician (General Surgery) Mast, Man X, NP as Nurse Practitioner (Nurse Practitioner)  Extended Emergency Contact Information Primary Emergency Contact: [facility] Address: [place], [place] [address] Johnnette Litter of [place] Phone: [phone] Mobile Phone: [phone] Relation: Daughter Secondary Emergency Contact: Shappell,[name] Address: [address] [place]          [place], [place] [address] [place] Phone: [phone] Mobile Phone: [phone] Relation: Son  Code Status:  DNR  Goals of care: Advanced Directive information Advanced Directives 10/12/2017  Does Patient Have a Medical Advance Directive? Yes  Type of Paramedic of [place];Living will;Out of facility DNR (pink MOST or yellow form)  Does patient want to make changes to medical advance directive? No - Patient declined  Copy of [place] in Chart? Yes  Would patient like information on creating a medical advance directive? -  Pre-existing out of facility DNR order (yellow form or pink MOST form) Yellow form placed in chart (order not valid for inpatient use);Pink MOST form placed  in chart (order not valid for inpatient use)     Chief Complaint  Patient presents with  . Medical Management of Chronic Issues    Routine Visit     HPI:  Pt is a 84 y.o. female seen today for medical management of chronic diseases. She is seen in her room today with charge nurse present. She is in no distress and denies any acute health concern. She is followed by hospice service. She goes to dining area for meals. She ambulates with walker.   Chronic respiratory failure- with history of chronic sinusitis, Pulmonary embolism and wegner's. stable at present overall poor prognosis. Followed by hospice services. Takes breathing treatment and oxygne as needed  Insomnia- takes trazodone 150 mg daily and melatonin, sleeping well at night  Chronic cough- with wegner's, sinusitis and respiratory failure along with gerd. Currently on mucinex bid and prn with atrovent tid with xopenex as needed  Hypokalemia- takes kcl supplement, denies muscle cramps  gerd- tolerating omeprazole well  Protein calorie malnutrition- weight low but stable, takes feeding supplement, goes to dining area for meals  Chronic PE- on long term warfarin for anticoagulation and stroke prevention   Past Medical History:  Diagnosis Date  . Acute respiratory failure with hypoxia ([place])   . Anemia   . Aspergillosis ([place])   . Bladder incontinence   . Blindness of one eye   . Cancer ([place])    melanoma   ([place])  . Chronic sinusitis   . Clotting disorder ([place])   . Difficult intubation    Difficult airway with intubation for ARF 07/22/14 (due to anterior larynx, also had mucous plug)  . Fatigue 12/09/2015  . Glaucoma   . Hearing loss in left ear  hearing aid both ears  . History of pulmonary embolism 1997  . Interstitial emphysema ([place])   . Left shoulder pain 12/09/2015  . Multiple allergies   . Neuropathy   . OA (osteoarthritis)   . Pneumonia 12/15  . Shortness of breath dyspnea   . Stroke [facility])    ??? blind  left eye  . Wegener's granulomatosis ([place])    Past Surgical History:  Procedure Laterality Date  . bronchosocpy     pt. states she has had over 10 in last 20 yrs.  Marland Kitchen CATARACT EXTRACTION    . COCHLEAR IMPLANT Left 10/27/2015   Procedure: LEFT COCHLEAR IMPLANT;  Surgeon: Leta Baptist, MD;  Location: [facility];  Service: ENT;  Laterality: Left;  . INGUINAL HERNIA REPAIR Left 02/17/2014   Procedure: LEFT INGUINAL HERNIA REPAIR WITH MESH;  Surgeon: Harl Bowie, MD;  Location: [facility];  Service: General;  Laterality: Left;  . INSERTION OF MESH N/A 02/17/2014   Procedure: INSERTION OF MESH;  Surgeon: Harl Bowie, MD;  Location: [facility];  Service: General;  Laterality: N/A;  . KNEE ARTHROSCOPY  2006   rt   . MELANOMA EXCISION     left leg  . PUBOVAGINAL SLING    . TEAR DUCT PROBING    . TUBAL LIGATION    . TYMPANOMASTOIDECTOMY Left 04/28/2015  . TYMPANOMASTOIDECTOMY Left 04/28/2015   Procedure: LEFT TYMPANOMASTOIDECTOMY;  Surgeon: Leta Baptist, MD;  Location: [facility];  Service: ENT;  Laterality: Left;  Marland Kitchen VENA CAVA FILTER PLACEMENT  1997   [place] filter    Allergies  Allergen Reactions  . Adhesive [Tape] Rash    Caused a rash on lower extremities     Outpatient Encounter Medications as of 10/12/2017  Medication Sig  . acetaminophen (TYLENOL) 325 MG tablet Take 650 mg by mouth See admin instructions. 650 mg at bedtime every night and may take an additional 650 mg every 12 hours as needed for pain  . diclofenac sodium (VOLTAREN) 1 % GEL Apply 2 g topically 3 (three) times daily. Apply to left shoulder.  . Efinaconazole (JUBLIA) 10 % SOLN Apply 1 application topically daily. Right hand. Stop date 12/28/17  . feeding supplement (BOOST HIGH PROTEIN) LIQD Take 1 Container by mouth 2 (two) times daily.  . Fluticasone-Salmeterol (ADVAIR DISKUS) 500-50 MCG/DOSE AEPB INHALE 1 PUFF EVERY 12 HOURS.  . folic acid (FOLVITE) 1 MG tablet Take 1 mg by mouth daily.   Marland Kitchen  guaiFENesin (MUCINEX) 600 MG 12 hr tablet Take 600 mg by mouth every 12 (twelve) hours.   Marland Kitchen guaiFENesin-dextromethorphan (ROBITUSSIN DM) 100-10 MG/5ML syrup Take 5 mLs every 4 (four) hours as needed by mouth for cough.  . hydrocortisone cream 1 % Apply 1 application topically 4 (four) times daily as needed for itching.   Marland Kitchen ipratropium (ATROVENT) 0.02 % nebulizer solution Take 2.5 mLs (0.5 mg total) 3 (three) times daily by nebulization.  Marland Kitchen latanoprost (XALATAN) 0.005 % ophthalmic solution Place 1 drop into both eyes at bedtime.   . levalbuterol (XOPENEX) 0.63 MG/3ML nebulizer solution Take 0.63 mg by nebulization every 6 (six) hours as needed for wheezing or shortness of breath.  . Melatonin 3 MG TABS Take 3 mg by mouth at bedtime.  . naphazoline-pheniramine (NAPHCON-A) 0.025-0.3 % ophthalmic solution Place 1 drop 3 (three) times daily into the left eye.   Marland Kitchen omeprazole (PRILOSEC) 20 MG capsule Take 20 mg by mouth daily.  . OXYGEN Inhale 2 L into the  lungs as needed (to maintain 90% o2).  . phenol (SORE THROAT SPRAY) 1.4 % LIQD Use as directed 1 spray in the mouth or throat as needed for throat irritation / pain.   . potassium chloride SA (K-DUR,KLOR-CON) 20 MEQ tablet Take 40 mEq by mouth daily.   Marland Kitchen saccharomyces boulardii (FLORASTOR) 250 MG capsule Take 250 mg by mouth daily.   Marland Kitchen sulfamethoxazole-trimethoprim (BACTRIM DS,SEPTRA DS) 800-160 MG tablet Take 1 tablet by mouth every Monday, Wednesday, and Friday.   . traZODone (DESYREL) 150 MG tablet Take 75 mg by mouth at bedtime.   Marland Kitchen warfarin (COUMADIN) 4 MG tablet Take 4 mg by mouth daily.  . [DISCONTINUED] warfarin (COUMADIN) 3 MG tablet Take 3 mg by mouth daily.   No facility-administered encounter medications on file as of 10/12/2017.     Review of Systems  Constitutional: Positive for fatigue. Negative for appetite change, chills and fever.       Has chronic fatigue  HENT: Positive for hearing loss. Negative for congestion, ear discharge,  ear pain, mouth sores, postnasal drip, rhinorrhea, sinus pain, sore throat and trouble swallowing.   Eyes: Positive for visual disturbance.  Respiratory: Positive for cough and shortness of breath. Negative for choking.        Has chronic cough and dyspnea with chronic respiratory failure  Cardiovascular: Negative for chest pain, palpitations and leg swelling.  Gastrointestinal: Negative for abdominal pain, constipation, diarrhea, nausea and vomiting.       She had a bowel movement yesterday  Genitourinary: Negative for dysuria, flank pain, hematuria and pelvic pain.  Musculoskeletal: Positive for gait problem. Negative for arthralgias and back pain.  Skin: Negative for rash and wound.  Neurological: Positive for weakness. Negative for dizziness, light-headedness and headaches.  Psychiatric/Behavioral: Positive for confusion. Negative for behavioral problems.    Immunization History  Administered Date(s) Administered  . Influenza Split 03/04/2012, 03/11/2014, 04/01/2015  . Influenza,inj,Quad PF,6+ Mos 03/03/2013  . Influenza-Unspecified 04/01/2015, 04/19/2016, 04/23/2017  . PPD Test 05/25/2015  . Pneumococcal Conjugate-13 07/01/2015  . Pneumococcal Polysaccharide-23 09/02/2015  . Zoster 09/30/1994   Pertinent  Health Maintenance Due  Topic Date Due  . INFLUENZA VACCINE  01/31/2018  . DEXA SCAN  Completed  . PNA vac Low Risk Adult  Completed   Fall Risk  03/20/2017 12/09/2015 09/23/2015 09/09/2015 02/03/2015  Falls in the past year? No No Yes Yes Yes  Number falls in past yr: - - 2 or more 2 or more 2 or more  Comment - - - - -  Injury with Fall? - - No No -   Functional Status Survey:    Vitals:   10/12/17 1235  BP: 118/66  Pulse: 68  Resp: 18  Temp: 99.1 F (37.3 C)  TempSrc: Oral  SpO2: 96%  Weight: 102 lb (46.3 kg)  Height: 5\' 1"  (1.549 m)   Body mass index is 19.27 kg/m.   Wt Readings from Last 3 Encounters:  10/12/17 102 lb (46.3 kg)  10/03/17 102 lb (46.3 kg)    09/27/17 102 lb (46.3 kg)   Physical Exam  Constitutional: She is oriented to person, place, and time.  Weight low but stable in recent weeks, frail, chronically ill appearing elderly female, in no acute distress  HENT:  Head: Normocephalic and atraumatic.  Right Ear: External ear normal.  Left Ear: External ear normal.  Mouth/Throat: Oropharynx is clear and moist.  Cochlear implant present  Eyes: Pupils are equal, round, and reactive to light. Conjunctivae and EOM  are normal. Right eye exhibits no discharge. Left eye exhibits no discharge.  Neck: Normal range of motion. Neck supple.  Cardiovascular: Normal rate and regular rhythm.  Pulmonary/Chest: Effort normal. No respiratory distress. She has no wheezes. She has no rales.  Poor air movement  Abdominal: Soft. Bowel sounds are normal. There is no tenderness. There is no guarding.  Musculoskeletal: She exhibits edema.  Able to move all 4 extremities, uses walker and wheelchair  Lymphadenopathy:    She has no cervical adenopathy.  Neurological: She is alert and oriented to person, place, and time.  Skin: Skin is warm and dry. No rash noted. She is not diaphoretic.  Psychiatric: She has a normal mood and affect.    Labs reviewed: Recent Labs    06/21/17 1105  07/18/17 1448 07/19/17 0959  08/09/17 08/30/17 09/13/17  NA 143   < > 135 136   < > 135* 139 138  K 4.1   < > 4.6 4.8*   < > 3.9 3.7 3.7  CL 107  --  103 106  --   --   --  107  CO2 26  --  24 21*  --   --   --  27  GLUCOSE 146*  --  133* 145*  --   --   --   --   BUN 6*   < > 10 10   < > 8 9 8   CREATININE 1.1   < > 0.77 0.99   < > 0.6 0.7 0.7  CALCIUM 9.9  --  10.2 10.5*  --   --   --  9.0   < > = values in this interval not displayed.   Recent Labs    06/05/17 1850  06/21/17 1105  07/19/17 0959 08/02/17 08/30/17 09/13/17  AST 39   < > 28   < > 26 16 14 15   ALT 23   < > 21   < > 12 11 8 7   ALKPHOS 129*   < > 124*   < > 203* 179* 129* 102  BILITOT 0.5  --  0.50   --  0.3  --   --   --   PROT 6.0*  --  6.9  --  7.1  --   --  4.5  ALBUMIN 3.0*  --  3.0*  --  2.6*  --   --  2.6   < > = values in this interval not displayed.   Recent Labs    06/21/17 1105  07/18/17 1448 07/19/17 0959  08/30/17 09/13/17 10/04/17  WBC 4.4   < > 7.9 A Manual Differential was performed and is consistent with the Automated Differential. 9.1   < > 4.8 4.2 4.2  NEUTROABS 3.4  --  6.4 7.8*  --   --   --   --   HGB 11.2*   < > 12.9  --    < > 9.3* 9.2* 9.3*  HCT 34.1*   < > 39.5 37.7   < > 26* 26* 26*  MCV 111*  --  106.3* 105.6*  --   --   --   --   PLT 328   < > 421.0* 355   < > 317 313 274   < > = values in this interval not displayed.   Lab Results  Component Value Date   TSH 1.160 06/06/2017   Lab Results  Component Value Date   HGBA1C 5.8 (H) 07/28/2014  No results found for: CHOL, HDL, LDLCALC, LDLDIRECT, TRIG, CHOLHDL  Significant Diagnostic Results in last 30 days:  No results found.  Assessment/Plan  1. Chronic respiratory failure with hypoxia (HCC) Stable at present, goal is for comfort care, pt comfortable this visit. Continue o2 as needed, continue bronchodilators and cough medication.   2. Gastroesophageal reflux disease without esophagitis Continue PPI, monitor  3. Insomnia, unspecified type Continue trazodone and melatonin  4. Chronic cough Continue mucinex, supportive care  5. Hypokalemia Stable k on BMP, continue kcl  6. Moderate protein-calorie malnutrition ([place]) Continue nutritional support, decline anticipated, skin care for pressure ulcer prophylaxis  7. Other chronic pulmonary embolism without acute cor pulmonale (HCC) Continue warfarin with goal inr 2-3, o2 as needed   Family/ staff Communication: reviewed care plan with patient and charge nurse.    Labs/tests ordered:  none   Blanchie Serve, MD Internal Medicine [facility] [address] [place], [place] [address] Cell Phone  (Monday-Friday 8 am - 5 pm): [phone] On Call: [phone] and follow prompts after 5 pm and on weekends Office Phone: [phone] Office Fax: [phone]

## 2017-10-17 ENCOUNTER — Encounter: Payer: Self-pay | Admitting: *Deleted

## 2017-10-17 ENCOUNTER — Inpatient Hospital Stay: Attending: Hematology & Oncology | Admitting: Family

## 2017-10-17 ENCOUNTER — Other Ambulatory Visit: Payer: Self-pay

## 2017-10-17 ENCOUNTER — Encounter: Payer: Self-pay | Admitting: Family

## 2017-10-17 ENCOUNTER — Inpatient Hospital Stay

## 2017-10-17 VITALS — BP 114/52 | HR 82 | Temp 98.0°F | Resp 20 | Wt 112.0 lb

## 2017-10-17 DIAGNOSIS — D509 Iron deficiency anemia, unspecified: Secondary | ICD-10-CM | POA: Insufficient documentation

## 2017-10-17 DIAGNOSIS — Z79899 Other long term (current) drug therapy: Secondary | ICD-10-CM | POA: Diagnosis not present

## 2017-10-17 DIAGNOSIS — Z7901 Long term (current) use of anticoagulants: Secondary | ICD-10-CM | POA: Insufficient documentation

## 2017-10-17 DIAGNOSIS — R5383 Other fatigue: Secondary | ICD-10-CM | POA: Insufficient documentation

## 2017-10-17 DIAGNOSIS — M3130 Wegener's granulomatosis without renal involvement: Secondary | ICD-10-CM

## 2017-10-17 DIAGNOSIS — D500 Iron deficiency anemia secondary to blood loss (chronic): Secondary | ICD-10-CM

## 2017-10-17 LAB — IRON AND TIBC
IRON: 46 ug/dL (ref 41–142)
Saturation Ratios: 20 % — ABNORMAL LOW (ref 21–57)
TIBC: 224 ug/dL — AB (ref 236–444)
UIBC: 178 ug/dL

## 2017-10-17 LAB — CBC WITH DIFFERENTIAL (CANCER CENTER ONLY)
BASOS ABS: 0 10*3/uL (ref 0.0–0.1)
BASOS PCT: 1 %
EOS ABS: 0.1 10*3/uL (ref 0.0–0.5)
EOS PCT: 2 %
HCT: 34.6 % — ABNORMAL LOW (ref 34.8–46.6)
Hemoglobin: 11.5 g/dL — ABNORMAL LOW (ref 11.6–15.9)
LYMPHS PCT: 13 %
Lymphs Abs: 0.6 10*3/uL — ABNORMAL LOW (ref 0.9–3.3)
MCH: 34.7 pg — ABNORMAL HIGH (ref 26.0–34.0)
MCHC: 33.2 g/dL (ref 32.0–36.0)
MCV: 104.5 fL — ABNORMAL HIGH (ref 81.0–101.0)
MONO ABS: 0.7 10*3/uL (ref 0.1–0.9)
Monocytes Relative: 14 %
Neutro Abs: 3.2 10*3/uL (ref 1.5–6.5)
Neutrophils Relative %: 70 %
PLATELETS: 247 10*3/uL (ref 145–400)
RBC: 3.31 MIL/uL — AB (ref 3.70–5.32)
RDW: 14.8 % (ref 11.1–15.7)
WBC: 4.6 10*3/uL (ref 3.9–10.0)

## 2017-10-17 LAB — CMP (CANCER CENTER ONLY)
ALK PHOS: 144 U/L — AB (ref 26–84)
ALT: 17 U/L (ref 10–47)
ANION GAP: 8 (ref 5–15)
AST: 22 U/L (ref 11–38)
Albumin: 3.3 g/dL — ABNORMAL LOW (ref 3.5–5.0)
BUN: 12 mg/dL (ref 7–22)
CO2: 28 mmol/L (ref 18–33)
Calcium: 10.1 mg/dL (ref 8.0–10.3)
Chloride: 106 mmol/L (ref 98–108)
Creatinine: 0.9 mg/dL (ref 0.60–1.20)
Glucose, Bld: 108 mg/dL (ref 73–118)
POTASSIUM: 4 mmol/L (ref 3.3–4.7)
Sodium: 142 mmol/L (ref 128–145)
TOTAL PROTEIN: 6.7 g/dL (ref 6.4–8.1)
Total Bilirubin: 0.5 mg/dL (ref 0.2–1.6)

## 2017-10-17 LAB — FERRITIN: Ferritin: 329 ng/mL — ABNORMAL HIGH (ref 9–269)

## 2017-10-17 NOTE — Progress Notes (Signed)
Hematology and Oncology Follow Up Visit  [name] [id number] January 12, 1933 84 y.o. 10/17/2017   Principle Diagnosis:  Recurrent iron deficiency anemia Wegener's granulomatosis  Current Therapy:   IV iron as indicated - last received in June 22, 2017   Interim History:  [name] is here today with her caregiver from [place] for follow-up. She is feeling fatigued. Her Hgb today is 11.5 and MCV 104. Her iron studies in January were stable. Today's results are pending.  She has tolerated Coumadin nicely and denies having had any episodes of bleeding. No bruising or petechiae.  No fever, chills, n/v, cough, rash, dizziness, SOB, chest pain, palpitations, abdominal pain or changes in bowel or bladder habits.  No swelling, tenderness, numbness or tingling in her extremities at this time. No c/o pain.   She has an ok appetite and is trying her best to stay hydrated. Her weight is stable.   ECOG Performance Status: 2 - Symptomatic, <50% confined to bed  Medications:  Allergies as of 10/17/2017      Reactions   Adhesive [tape] Rash   Caused a rash on lower extremities       Medication List        Accurate as of 10/17/17 10:54 AM. Always use your most recent med list.          acetaminophen 325 MG tablet Commonly known as:  TYLENOL Take 650 mg by mouth See admin instructions. 650 mg at bedtime every night and may take an additional 650 mg every 12 hours as needed for pain   diclofenac sodium 1 % Gel Commonly known as:  VOLTAREN Apply 2 g topically 3 (three) times daily. Apply to left shoulder.   feeding supplement Liqd Take 1 Container by mouth 2 (two) times daily.   Fluticasone-Salmeterol 500-50 MCG/DOSE Aepb Commonly known as:  ADVAIR DISKUS INHALE 1 PUFF EVERY 12 HOURS.   folic acid 1 MG tablet Commonly known as:  FOLVITE Take 1 mg by mouth daily.   guaiFENesin 600 MG 12 hr tablet Commonly known as:  MUCINEX Take 600 mg by mouth every 12 (twelve) hours.   guaiFENesin-dextromethorphan 100-10 MG/5ML syrup Commonly known as:  ROBITUSSIN DM Take 5 mLs every 4 (four) hours as needed by mouth for cough.   hydrocortisone cream 1 % Apply 1 application topically 4 (four) times daily as needed for itching.   ipratropium 0.02 % nebulizer solution Commonly known as:  ATROVENT Take 2.5 mLs (0.5 mg total) 3 (three) times daily by nebulization.   JUBLIA 10 % Soln Generic drug:  Efinaconazole Apply 1 application topically daily. Right hand. Stop date 12/28/17   latanoprost 0.005 % ophthalmic solution Commonly known as:  XALATAN Place 1 drop into both eyes at bedtime.   levalbuterol 0.63 MG/3ML nebulizer solution Commonly known as:  XOPENEX Take 0.63 mg by nebulization every 6 (six) hours as needed for wheezing or shortness of breath.   Melatonin 3 MG Tabs Take 3 mg by mouth at bedtime.   NAPHCON-A 0.025-0.3 % ophthalmic solution Generic drug:  naphazoline-pheniramine Place 1 drop 3 (three) times daily into the left eye.   omeprazole 20 MG capsule Commonly known as:  PRILOSEC Take 20 mg by mouth daily.   OXYGEN Inhale 2 L into the lungs as needed (to maintain 90% o2).   potassium chloride SA 20 MEQ tablet Commonly known as:  K-DUR,KLOR-CON Take 40 mEq by mouth daily.   saccharomyces boulardii 250 MG capsule Commonly known as:  FLORASTOR Take  250 mg by mouth daily.   SORE THROAT SPRAY 1.4 % Liqd Generic drug:  phenol Use as directed 1 spray in the mouth or throat as needed for throat irritation / pain.   sulfamethoxazole-trimethoprim 800-160 MG tablet Commonly known as:  BACTRIM DS,SEPTRA DS Take 1 tablet by mouth every Monday, Wednesday, and Friday.   traZODone 150 MG tablet Commonly known as:  DESYREL Take 75 mg by mouth at bedtime.   warfarin 4 MG tablet Commonly known as:  COUMADIN Take 4 mg by mouth daily.       Allergies:  Allergies  Allergen Reactions  . Adhesive [Tape] Rash    Caused a rash on lower  extremities     Past Medical History, Surgical history, Social history, and Family History were reviewed and updated.  Review of Systems: All other 10 point review of systems is negative.   Physical Exam:  vitals were not taken for this visit.   Wt Readings from Last 3 Encounters:  10/12/17 102 lb (46.3 kg)  10/03/17 102 lb (46.3 kg)  09/27/17 102 lb (46.3 kg)    Ocular: Sclerae unicteric, pupils equal, round and reactive to light Ear-nose-throat: Oropharynx clear, dentition fair Lymphatic: No cervical, supraclavicular or axillary adenopathy Lungs no rales or rhonchi, good excursion bilaterally Heart regular rate and rhythm, no murmur appreciated Abd soft, nontender, positive bowel sounds, no liver or spleen tip palpated on exam, no fluid wave  MSK no focal spinal tenderness, no joint edema Neuro: non-focal, well-oriented, appropriate affect Breasts: Deferred   Lab Results  Component Value Date   WBC 4.6 10/17/2017   HGB 9.3 (A) 10/04/2017   HCT 34.6 (L) 10/17/2017   MCV 104.5 (H) 10/17/2017   PLT 247 10/17/2017   Lab Results  Component Value Date   FERRITIN 1,226 (H) 07/19/2017   IRON 31 (L) 07/19/2017   TIBC 121 (L) 07/19/2017   UIBC 90 07/19/2017   IRONPCTSAT 25 07/19/2017   Lab Results  Component Value Date   RETICCTPCT 1.4 07/19/2017   RBC 3.31 (L) 10/17/2017   RETICCTABS 62.4 02/03/2015   No results found for: KPAFRELGTCHN, LAMBDASER, KAPLAMBRATIO No results found for: IGGSERUM, IGA, IGMSERUM No results found for: Kathrynn Ducking, MSPIKE, SPEI   Chemistry      Component Value Date/Time   NA 138 09/13/2017   NA 143 06/21/2017 1105   NA 137 05/10/2017 1142   K 3.7 09/13/2017   K 4.1 06/21/2017 1105   K 3.9 05/10/2017 1142   CL 107 09/13/2017   CO2 27 09/13/2017   CO2 26 06/21/2017 1105   CO2 22 05/10/2017 1142   BUN 8 09/13/2017   BUN 6 (L) 06/21/2017 1105   BUN 12.5 05/10/2017 1142   CREATININE 0.7  09/13/2017   CREATININE 0.99 07/19/2017 0959   CREATININE 1.1 06/21/2017 1105   CREATININE 1.1 05/10/2017 1142   GLU 84 09/13/2017      Component Value Date/Time   CALCIUM 9.0 09/13/2017   CALCIUM 10.1 05/10/2017 1142   ALKPHOS 102 09/13/2017   ALKPHOS 124 (H) 06/21/2017 1105   ALKPHOS 150 05/10/2017 1142   AST 15 09/13/2017   AST 26 07/19/2017 0959   AST 33 05/10/2017 1142   ALT 7 09/13/2017   ALT 12 07/19/2017 0959   ALT 21 06/21/2017 1105   ALT 24 05/10/2017 1142   BILITOT 0.3 07/19/2017 0959   BILITOT 0.46 05/10/2017 1142      Impression and Plan: [name]  is a very pleasant 84 yo caucasian female with Wegener's granulomatosis and iron deficiency anemia.  She is symptomatic at this time with fatigue. We will see what her iron studies show and bring her back in for infusion if needed.  We will go ahead and plan to see her back in another 3 months for follow-up.  Both her facility and family know to contact our office with any questions or concerns. We can certainly see her sooner if need be.   Laverna Peace, NP 4/17/201910:54 AM

## 2017-10-19 ENCOUNTER — Telehealth: Payer: Self-pay | Admitting: *Deleted

## 2017-10-19 NOTE — Telephone Encounter (Addendum)
Patient's daughter aware of results. Message sent to scheduler.   ----- Message from Volanda Napoleon, MD sent at 10/17/2017  3:09 PM EDT ----- Call - the iron level is a little low.  She needs one dose of IV iron!!  Laurey Arrow

## 2017-10-23 ENCOUNTER — Other Ambulatory Visit: Payer: Self-pay | Admitting: *Deleted

## 2017-10-23 DIAGNOSIS — I509 Heart failure, unspecified: Secondary | ICD-10-CM | POA: Diagnosis not present

## 2017-10-23 DIAGNOSIS — N183 Chronic kidney disease, stage 3 (moderate): Secondary | ICD-10-CM | POA: Diagnosis not present

## 2017-10-23 DIAGNOSIS — I471 Supraventricular tachycardia: Secondary | ICD-10-CM | POA: Diagnosis not present

## 2017-10-23 DIAGNOSIS — M3131 Wegener's granulomatosis with renal involvement: Secondary | ICD-10-CM | POA: Diagnosis not present

## 2017-10-23 DIAGNOSIS — J45909 Unspecified asthma, uncomplicated: Secondary | ICD-10-CM | POA: Diagnosis not present

## 2017-10-23 DIAGNOSIS — I679 Cerebrovascular disease, unspecified: Secondary | ICD-10-CM | POA: Diagnosis not present

## 2017-10-23 LAB — PROTIME-INR: Protime: 19.9 — AB (ref 10.0–13.8)

## 2017-10-23 LAB — POCT INR: INR: 1.9 — AB (ref <=1.1)

## 2017-10-25 LAB — POCT INR: INR: 1.3 — AB (ref <=1.1)

## 2017-10-25 LAB — PROTIME-INR: PROTIME: 14.2 — AB (ref 10.0–13.8)

## 2017-10-26 ENCOUNTER — Other Ambulatory Visit: Payer: Self-pay | Admitting: *Deleted

## 2017-10-26 DIAGNOSIS — N183 Chronic kidney disease, stage 3 (moderate): Secondary | ICD-10-CM | POA: Diagnosis not present

## 2017-10-26 DIAGNOSIS — J45909 Unspecified asthma, uncomplicated: Secondary | ICD-10-CM | POA: Diagnosis not present

## 2017-10-26 DIAGNOSIS — I471 Supraventricular tachycardia: Secondary | ICD-10-CM | POA: Diagnosis not present

## 2017-10-26 DIAGNOSIS — I679 Cerebrovascular disease, unspecified: Secondary | ICD-10-CM | POA: Diagnosis not present

## 2017-10-26 DIAGNOSIS — M3131 Wegener's granulomatosis with renal involvement: Secondary | ICD-10-CM | POA: Diagnosis not present

## 2017-10-26 DIAGNOSIS — I509 Heart failure, unspecified: Secondary | ICD-10-CM | POA: Diagnosis not present

## 2017-10-29 DIAGNOSIS — N183 Chronic kidney disease, stage 3 (moderate): Secondary | ICD-10-CM | POA: Diagnosis not present

## 2017-10-29 DIAGNOSIS — I679 Cerebrovascular disease, unspecified: Secondary | ICD-10-CM | POA: Diagnosis not present

## 2017-10-29 DIAGNOSIS — I471 Supraventricular tachycardia: Secondary | ICD-10-CM | POA: Diagnosis not present

## 2017-10-29 DIAGNOSIS — I509 Heart failure, unspecified: Secondary | ICD-10-CM | POA: Diagnosis not present

## 2017-10-29 DIAGNOSIS — J45909 Unspecified asthma, uncomplicated: Secondary | ICD-10-CM | POA: Diagnosis not present

## 2017-10-29 DIAGNOSIS — M3131 Wegener's granulomatosis with renal involvement: Secondary | ICD-10-CM | POA: Diagnosis not present

## 2017-10-30 ENCOUNTER — Inpatient Hospital Stay

## 2017-10-30 VITALS — BP 118/59 | HR 89 | Temp 97.6°F | Resp 18

## 2017-10-30 DIAGNOSIS — D638 Anemia in other chronic diseases classified elsewhere: Secondary | ICD-10-CM

## 2017-10-30 DIAGNOSIS — Z79899 Other long term (current) drug therapy: Secondary | ICD-10-CM | POA: Diagnosis not present

## 2017-10-30 DIAGNOSIS — H903 Sensorineural hearing loss, bilateral: Secondary | ICD-10-CM | POA: Diagnosis not present

## 2017-10-30 DIAGNOSIS — H6123 Impacted cerumen, bilateral: Secondary | ICD-10-CM | POA: Diagnosis not present

## 2017-10-30 DIAGNOSIS — Z7901 Long term (current) use of anticoagulants: Secondary | ICD-10-CM | POA: Diagnosis not present

## 2017-10-30 DIAGNOSIS — D509 Iron deficiency anemia, unspecified: Secondary | ICD-10-CM | POA: Diagnosis not present

## 2017-10-30 DIAGNOSIS — R5383 Other fatigue: Secondary | ICD-10-CM | POA: Diagnosis not present

## 2017-10-30 DIAGNOSIS — M3130 Wegener's granulomatosis without renal involvement: Secondary | ICD-10-CM | POA: Diagnosis not present

## 2017-10-30 MED ORDER — SODIUM CHLORIDE 0.9 % IV SOLN
510.0000 mg | Freq: Once | INTRAVENOUS | Status: AC
  Administered 2017-10-30: 510 mg via INTRAVENOUS
  Filled 2017-10-30: qty 17

## 2017-10-30 MED ORDER — SODIUM CHLORIDE 0.9 % IV SOLN
INTRAVENOUS | Status: DC
  Administered 2017-10-30: 09:00:00 via INTRAVENOUS

## 2017-10-30 NOTE — Patient Instructions (Signed)

## 2017-10-31 DIAGNOSIS — R1310 Dysphagia, unspecified: Secondary | ICD-10-CM | POA: Diagnosis not present

## 2017-10-31 DIAGNOSIS — J45909 Unspecified asthma, uncomplicated: Secondary | ICD-10-CM | POA: Diagnosis not present

## 2017-10-31 DIAGNOSIS — F411 Generalized anxiety disorder: Secondary | ICD-10-CM | POA: Diagnosis not present

## 2017-10-31 DIAGNOSIS — K219 Gastro-esophageal reflux disease without esophagitis: Secondary | ICD-10-CM | POA: Diagnosis not present

## 2017-10-31 DIAGNOSIS — Z86711 Personal history of pulmonary embolism: Secondary | ICD-10-CM | POA: Diagnosis not present

## 2017-10-31 DIAGNOSIS — N183 Chronic kidney disease, stage 3 (moderate): Secondary | ICD-10-CM | POA: Diagnosis not present

## 2017-10-31 DIAGNOSIS — G9009 Other idiopathic peripheral autonomic neuropathy: Secondary | ICD-10-CM | POA: Diagnosis not present

## 2017-10-31 DIAGNOSIS — Z85820 Personal history of malignant melanoma of skin: Secondary | ICD-10-CM | POA: Diagnosis not present

## 2017-10-31 DIAGNOSIS — M3131 Wegener's granulomatosis with renal involvement: Secondary | ICD-10-CM | POA: Diagnosis not present

## 2017-10-31 DIAGNOSIS — H409 Unspecified glaucoma: Secondary | ICD-10-CM | POA: Diagnosis not present

## 2017-10-31 DIAGNOSIS — I509 Heart failure, unspecified: Secondary | ICD-10-CM | POA: Diagnosis not present

## 2017-10-31 DIAGNOSIS — J309 Allergic rhinitis, unspecified: Secondary | ICD-10-CM | POA: Diagnosis not present

## 2017-10-31 DIAGNOSIS — N3281 Overactive bladder: Secondary | ICD-10-CM | POA: Diagnosis not present

## 2017-10-31 DIAGNOSIS — Z86718 Personal history of other venous thrombosis and embolism: Secondary | ICD-10-CM | POA: Diagnosis not present

## 2017-10-31 DIAGNOSIS — F339 Major depressive disorder, recurrent, unspecified: Secondary | ICD-10-CM | POA: Diagnosis not present

## 2017-10-31 DIAGNOSIS — I679 Cerebrovascular disease, unspecified: Secondary | ICD-10-CM | POA: Diagnosis not present

## 2017-10-31 DIAGNOSIS — I471 Supraventricular tachycardia: Secondary | ICD-10-CM | POA: Diagnosis not present

## 2017-11-05 DIAGNOSIS — N183 Chronic kidney disease, stage 3 (moderate): Secondary | ICD-10-CM | POA: Diagnosis not present

## 2017-11-05 DIAGNOSIS — M3131 Wegener's granulomatosis with renal involvement: Secondary | ICD-10-CM | POA: Diagnosis not present

## 2017-11-05 DIAGNOSIS — I679 Cerebrovascular disease, unspecified: Secondary | ICD-10-CM | POA: Diagnosis not present

## 2017-11-05 DIAGNOSIS — I509 Heart failure, unspecified: Secondary | ICD-10-CM | POA: Diagnosis not present

## 2017-11-05 DIAGNOSIS — J45909 Unspecified asthma, uncomplicated: Secondary | ICD-10-CM | POA: Diagnosis not present

## 2017-11-05 DIAGNOSIS — I471 Supraventricular tachycardia: Secondary | ICD-10-CM | POA: Diagnosis not present

## 2017-11-09 DIAGNOSIS — N183 Chronic kidney disease, stage 3 (moderate): Secondary | ICD-10-CM | POA: Diagnosis not present

## 2017-11-09 DIAGNOSIS — I471 Supraventricular tachycardia: Secondary | ICD-10-CM | POA: Diagnosis not present

## 2017-11-09 DIAGNOSIS — I509 Heart failure, unspecified: Secondary | ICD-10-CM | POA: Diagnosis not present

## 2017-11-09 DIAGNOSIS — I679 Cerebrovascular disease, unspecified: Secondary | ICD-10-CM | POA: Diagnosis not present

## 2017-11-09 DIAGNOSIS — M3131 Wegener's granulomatosis with renal involvement: Secondary | ICD-10-CM | POA: Diagnosis not present

## 2017-11-09 DIAGNOSIS — J45909 Unspecified asthma, uncomplicated: Secondary | ICD-10-CM | POA: Diagnosis not present

## 2017-11-14 DIAGNOSIS — K219 Gastro-esophageal reflux disease without esophagitis: Secondary | ICD-10-CM | POA: Diagnosis not present

## 2017-11-14 DIAGNOSIS — H9113 Presbycusis, bilateral: Secondary | ICD-10-CM | POA: Diagnosis not present

## 2017-11-14 DIAGNOSIS — M25512 Pain in left shoulder: Secondary | ICD-10-CM | POA: Diagnosis not present

## 2017-11-14 DIAGNOSIS — J449 Chronic obstructive pulmonary disease, unspecified: Secondary | ICD-10-CM | POA: Diagnosis not present

## 2017-11-14 DIAGNOSIS — F5101 Primary insomnia: Secondary | ICD-10-CM | POA: Diagnosis not present

## 2017-11-14 DIAGNOSIS — H40009 Preglaucoma, unspecified, unspecified eye: Secondary | ICD-10-CM | POA: Diagnosis not present

## 2017-11-14 DIAGNOSIS — R54 Age-related physical debility: Secondary | ICD-10-CM | POA: Diagnosis not present

## 2017-11-15 DIAGNOSIS — Z7901 Long term (current) use of anticoagulants: Secondary | ICD-10-CM | POA: Diagnosis not present

## 2017-11-15 DIAGNOSIS — Z79899 Other long term (current) drug therapy: Secondary | ICD-10-CM | POA: Diagnosis not present

## 2017-11-21 DIAGNOSIS — K219 Gastro-esophageal reflux disease without esophagitis: Secondary | ICD-10-CM | POA: Diagnosis not present

## 2017-11-21 DIAGNOSIS — M25512 Pain in left shoulder: Secondary | ICD-10-CM | POA: Diagnosis not present

## 2017-11-21 DIAGNOSIS — J449 Chronic obstructive pulmonary disease, unspecified: Secondary | ICD-10-CM | POA: Diagnosis not present

## 2017-11-21 DIAGNOSIS — R54 Age-related physical debility: Secondary | ICD-10-CM | POA: Diagnosis not present

## 2017-11-21 DIAGNOSIS — H9113 Presbycusis, bilateral: Secondary | ICD-10-CM | POA: Diagnosis not present

## 2017-11-21 DIAGNOSIS — H40009 Preglaucoma, unspecified, unspecified eye: Secondary | ICD-10-CM | POA: Diagnosis not present

## 2017-11-21 DIAGNOSIS — F5101 Primary insomnia: Secondary | ICD-10-CM | POA: Diagnosis not present

## 2017-11-25 DIAGNOSIS — J449 Chronic obstructive pulmonary disease, unspecified: Secondary | ICD-10-CM | POA: Diagnosis not present

## 2017-11-25 DIAGNOSIS — E46 Unspecified protein-calorie malnutrition: Secondary | ICD-10-CM | POA: Diagnosis not present

## 2017-11-25 DIAGNOSIS — J9611 Chronic respiratory failure with hypoxia: Secondary | ICD-10-CM | POA: Diagnosis not present

## 2017-11-27 DIAGNOSIS — F339 Major depressive disorder, recurrent, unspecified: Secondary | ICD-10-CM | POA: Diagnosis not present

## 2017-12-04 DIAGNOSIS — F339 Major depressive disorder, recurrent, unspecified: Secondary | ICD-10-CM | POA: Diagnosis not present

## 2017-12-05 DIAGNOSIS — M25512 Pain in left shoulder: Secondary | ICD-10-CM | POA: Diagnosis not present

## 2017-12-05 DIAGNOSIS — K219 Gastro-esophageal reflux disease without esophagitis: Secondary | ICD-10-CM | POA: Diagnosis not present

## 2017-12-05 DIAGNOSIS — R54 Age-related physical debility: Secondary | ICD-10-CM | POA: Diagnosis not present

## 2017-12-05 DIAGNOSIS — H9113 Presbycusis, bilateral: Secondary | ICD-10-CM | POA: Diagnosis not present

## 2017-12-05 DIAGNOSIS — F5101 Primary insomnia: Secondary | ICD-10-CM | POA: Diagnosis not present

## 2017-12-05 DIAGNOSIS — J449 Chronic obstructive pulmonary disease, unspecified: Secondary | ICD-10-CM | POA: Diagnosis not present

## 2017-12-05 DIAGNOSIS — H40009 Preglaucoma, unspecified, unspecified eye: Secondary | ICD-10-CM | POA: Diagnosis not present

## 2017-12-26 DIAGNOSIS — M1990 Unspecified osteoarthritis, unspecified site: Secondary | ICD-10-CM | POA: Diagnosis not present

## 2017-12-26 DIAGNOSIS — J449 Chronic obstructive pulmonary disease, unspecified: Secondary | ICD-10-CM | POA: Diagnosis not present

## 2017-12-26 DIAGNOSIS — M3130 Wegener's granulomatosis without renal involvement: Secondary | ICD-10-CM | POA: Diagnosis not present

## 2017-12-26 DIAGNOSIS — F330 Major depressive disorder, recurrent, mild: Secondary | ICD-10-CM | POA: Diagnosis not present

## 2017-12-26 DIAGNOSIS — K219 Gastro-esophageal reflux disease without esophagitis: Secondary | ICD-10-CM | POA: Diagnosis not present

## 2018-01-02 DIAGNOSIS — F4321 Adjustment disorder with depressed mood: Secondary | ICD-10-CM | POA: Diagnosis not present

## 2018-01-09 DIAGNOSIS — F330 Major depressive disorder, recurrent, mild: Secondary | ICD-10-CM | POA: Diagnosis not present

## 2018-01-16 ENCOUNTER — Inpatient Hospital Stay: Payer: Medicare Other

## 2018-01-16 ENCOUNTER — Inpatient Hospital Stay: Payer: Medicare Other | Attending: Hematology & Oncology | Admitting: Family

## 2018-01-20 ENCOUNTER — Encounter: Payer: Self-pay | Admitting: Family

## 2018-01-23 DIAGNOSIS — F330 Major depressive disorder, recurrent, mild: Secondary | ICD-10-CM | POA: Diagnosis not present

## 2018-01-24 DIAGNOSIS — G629 Polyneuropathy, unspecified: Secondary | ICD-10-CM | POA: Diagnosis not present

## 2018-01-24 DIAGNOSIS — F330 Major depressive disorder, recurrent, mild: Secondary | ICD-10-CM | POA: Diagnosis not present

## 2018-01-24 DIAGNOSIS — F4321 Adjustment disorder with depressed mood: Secondary | ICD-10-CM | POA: Diagnosis not present

## 2018-01-25 DIAGNOSIS — Z7901 Long term (current) use of anticoagulants: Secondary | ICD-10-CM | POA: Diagnosis not present

## 2018-02-04 DIAGNOSIS — I2782 Chronic pulmonary embolism: Secondary | ICD-10-CM | POA: Diagnosis not present

## 2018-02-04 DIAGNOSIS — K219 Gastro-esophageal reflux disease without esophagitis: Secondary | ICD-10-CM | POA: Diagnosis not present

## 2018-02-04 DIAGNOSIS — Z7901 Long term (current) use of anticoagulants: Secondary | ICD-10-CM | POA: Diagnosis not present

## 2018-02-04 DIAGNOSIS — M159 Polyosteoarthritis, unspecified: Secondary | ICD-10-CM | POA: Diagnosis not present

## 2018-02-06 DIAGNOSIS — F330 Major depressive disorder, recurrent, mild: Secondary | ICD-10-CM | POA: Diagnosis not present

## 2018-02-08 DIAGNOSIS — Z7901 Long term (current) use of anticoagulants: Secondary | ICD-10-CM | POA: Diagnosis not present

## 2018-02-11 DIAGNOSIS — L03818 Cellulitis of other sites: Secondary | ICD-10-CM | POA: Diagnosis not present

## 2018-02-11 DIAGNOSIS — M1990 Unspecified osteoarthritis, unspecified site: Secondary | ICD-10-CM | POA: Diagnosis not present

## 2018-02-11 DIAGNOSIS — K219 Gastro-esophageal reflux disease without esophagitis: Secondary | ICD-10-CM | POA: Diagnosis not present

## 2018-02-11 DIAGNOSIS — R5381 Other malaise: Secondary | ICD-10-CM | POA: Diagnosis not present

## 2018-02-13 ENCOUNTER — Encounter: Payer: Self-pay | Admitting: Internal Medicine

## 2018-02-13 DIAGNOSIS — F330 Major depressive disorder, recurrent, mild: Secondary | ICD-10-CM | POA: Diagnosis not present

## 2018-02-18 DIAGNOSIS — K219 Gastro-esophageal reflux disease without esophagitis: Secondary | ICD-10-CM | POA: Diagnosis not present

## 2018-02-18 DIAGNOSIS — I2699 Other pulmonary embolism without acute cor pulmonale: Secondary | ICD-10-CM | POA: Diagnosis not present

## 2018-02-18 DIAGNOSIS — R5381 Other malaise: Secondary | ICD-10-CM | POA: Diagnosis not present

## 2018-02-18 DIAGNOSIS — L03818 Cellulitis of other sites: Secondary | ICD-10-CM | POA: Diagnosis not present

## 2018-02-20 DIAGNOSIS — F330 Major depressive disorder, recurrent, mild: Secondary | ICD-10-CM | POA: Diagnosis not present

## 2018-02-22 DIAGNOSIS — Z7901 Long term (current) use of anticoagulants: Secondary | ICD-10-CM | POA: Diagnosis not present

## 2018-02-25 DIAGNOSIS — K219 Gastro-esophageal reflux disease without esophagitis: Secondary | ICD-10-CM | POA: Diagnosis not present

## 2018-02-25 DIAGNOSIS — R4181 Age-related cognitive decline: Secondary | ICD-10-CM | POA: Diagnosis not present

## 2018-02-25 DIAGNOSIS — L03818 Cellulitis of other sites: Secondary | ICD-10-CM | POA: Diagnosis not present

## 2018-02-25 DIAGNOSIS — R5381 Other malaise: Secondary | ICD-10-CM | POA: Diagnosis not present

## 2018-02-27 DIAGNOSIS — F330 Major depressive disorder, recurrent, mild: Secondary | ICD-10-CM | POA: Diagnosis not present

## 2018-03-01 DIAGNOSIS — F4321 Adjustment disorder with depressed mood: Secondary | ICD-10-CM | POA: Diagnosis not present

## 2018-03-01 DIAGNOSIS — Z7901 Long term (current) use of anticoagulants: Secondary | ICD-10-CM | POA: Diagnosis not present

## 2018-03-01 DIAGNOSIS — F330 Major depressive disorder, recurrent, mild: Secondary | ICD-10-CM | POA: Diagnosis not present

## 2018-03-01 DIAGNOSIS — J449 Chronic obstructive pulmonary disease, unspecified: Secondary | ICD-10-CM | POA: Diagnosis not present

## 2018-03-08 DIAGNOSIS — Z7901 Long term (current) use of anticoagulants: Secondary | ICD-10-CM | POA: Diagnosis not present

## 2018-03-12 DIAGNOSIS — Z7901 Long term (current) use of anticoagulants: Secondary | ICD-10-CM | POA: Diagnosis not present

## 2018-03-13 DIAGNOSIS — F330 Major depressive disorder, recurrent, mild: Secondary | ICD-10-CM | POA: Diagnosis not present

## 2018-03-25 DIAGNOSIS — M1990 Unspecified osteoarthritis, unspecified site: Secondary | ICD-10-CM | POA: Diagnosis not present

## 2018-03-25 DIAGNOSIS — R5381 Other malaise: Secondary | ICD-10-CM | POA: Diagnosis not present

## 2018-03-25 DIAGNOSIS — Z7901 Long term (current) use of anticoagulants: Secondary | ICD-10-CM | POA: Diagnosis not present

## 2018-03-25 DIAGNOSIS — I2699 Other pulmonary embolism without acute cor pulmonale: Secondary | ICD-10-CM | POA: Diagnosis not present

## 2018-03-25 DIAGNOSIS — K219 Gastro-esophageal reflux disease without esophagitis: Secondary | ICD-10-CM | POA: Diagnosis not present

## 2018-04-17 DIAGNOSIS — R4181 Age-related cognitive decline: Secondary | ICD-10-CM | POA: Diagnosis not present

## 2018-04-17 DIAGNOSIS — L03818 Cellulitis of other sites: Secondary | ICD-10-CM | POA: Diagnosis not present

## 2018-04-17 DIAGNOSIS — R5381 Other malaise: Secondary | ICD-10-CM | POA: Diagnosis not present

## 2018-04-17 DIAGNOSIS — K219 Gastro-esophageal reflux disease without esophagitis: Secondary | ICD-10-CM | POA: Diagnosis not present

## 2018-04-26 DIAGNOSIS — Z23 Encounter for immunization: Secondary | ICD-10-CM | POA: Diagnosis not present

## 2018-05-01 DIAGNOSIS — K219 Gastro-esophageal reflux disease without esophagitis: Secondary | ICD-10-CM | POA: Diagnosis not present

## 2018-05-01 DIAGNOSIS — I2782 Chronic pulmonary embolism: Secondary | ICD-10-CM | POA: Diagnosis not present

## 2018-05-01 DIAGNOSIS — R5381 Other malaise: Secondary | ICD-10-CM | POA: Diagnosis not present

## 2018-05-01 DIAGNOSIS — G629 Polyneuropathy, unspecified: Secondary | ICD-10-CM | POA: Diagnosis not present

## 2020-06-02 DEATH — deceased
